# Patient Record
Sex: Female | Born: 1950
Health system: Southern US, Community
[De-identification: ages and names within clinical notes are randomized; demographics above are authoritative.]

## PROBLEM LIST (undated history)

## (undated) DIAGNOSIS — Z808 Family history of malignant neoplasm of other organs or systems: Secondary | ICD-10-CM

## (undated) DIAGNOSIS — Z9889 Other specified postprocedural states: Secondary | ICD-10-CM

## (undated) DIAGNOSIS — E039 Hypothyroidism, unspecified: Secondary | ICD-10-CM

## (undated) DIAGNOSIS — Z800 Family history of malignant neoplasm of digestive organs: Secondary | ICD-10-CM

## (undated) DIAGNOSIS — K219 Gastro-esophageal reflux disease without esophagitis: Secondary | ICD-10-CM

## (undated) DIAGNOSIS — F329 Major depressive disorder, single episode, unspecified: Secondary | ICD-10-CM

## (undated) DIAGNOSIS — I10 Essential (primary) hypertension: Secondary | ICD-10-CM

## (undated) DIAGNOSIS — Z8042 Family history of malignant neoplasm of prostate: Secondary | ICD-10-CM

## (undated) DIAGNOSIS — C50919 Malignant neoplasm of unspecified site of unspecified female breast: Secondary | ICD-10-CM

## (undated) DIAGNOSIS — E669 Obesity, unspecified: Secondary | ICD-10-CM

## (undated) DIAGNOSIS — M1990 Unspecified osteoarthritis, unspecified site: Secondary | ICD-10-CM

## (undated) DIAGNOSIS — F32A Depression, unspecified: Secondary | ICD-10-CM

## (undated) DIAGNOSIS — R112 Nausea with vomiting, unspecified: Secondary | ICD-10-CM

## (undated) DIAGNOSIS — F419 Anxiety disorder, unspecified: Secondary | ICD-10-CM

## (undated) DIAGNOSIS — Z803 Family history of malignant neoplasm of breast: Secondary | ICD-10-CM

## (undated) HISTORY — PX: OTHER SURGICAL HISTORY: SHX169

## (undated) HISTORY — PX: THYROID SURGERY: SHX805

## (undated) HISTORY — DX: Family history of malignant neoplasm of digestive organs: Z80.0

## (undated) HISTORY — DX: Obesity, unspecified: E66.9

## (undated) HISTORY — PX: CARPAL TUNNEL RELEASE: SHX101

## (undated) HISTORY — PX: JOINT REPLACEMENT: SHX530

## (undated) HISTORY — PX: KNEE ARTHROSCOPY: SUR90

## (undated) HISTORY — DX: Essential (primary) hypertension: I10

## (undated) HISTORY — DX: Family history of malignant neoplasm of other organs or systems: Z80.8

## (undated) HISTORY — DX: Depression, unspecified: F32.A

## (undated) HISTORY — DX: Gastro-esophageal reflux disease without esophagitis: K21.9

## (undated) HISTORY — DX: Family history of malignant neoplasm of prostate: Z80.42

## (undated) HISTORY — PX: BREAST SURGERY: SHX581

## (undated) HISTORY — DX: Family history of malignant neoplasm of breast: Z80.3

## (undated) HISTORY — DX: Major depressive disorder, single episode, unspecified: F32.9

## (undated) HISTORY — PX: ABDOMINAL HYSTERECTOMY: SHX81

## (undated) HISTORY — DX: Unspecified osteoarthritis, unspecified site: M19.90

## (undated) HISTORY — DX: Hypothyroidism, unspecified: E03.9

## (undated) HISTORY — DX: Malignant neoplasm of unspecified site of unspecified female breast: C50.919

## (undated) HISTORY — DX: Anxiety disorder, unspecified: F41.9

---

## 2010-10-20 ENCOUNTER — Ambulatory Visit (HOSPITAL_COMMUNITY)
Admission: RE | Admit: 2010-10-20 | Discharge: 2010-10-20 | Disposition: A | Discharge status: Home / Self Care | Payer: 59 | Source: Ambulatory Visit | Attending: Family Medicine | Admitting: Family Medicine

## 2010-10-20 ENCOUNTER — Other Ambulatory Visit (HOSPITAL_COMMUNITY): Payer: Self-pay | Admitting: Family Medicine

## 2010-10-20 DIAGNOSIS — M25569 Pain in unspecified knee: Secondary | ICD-10-CM | POA: Insufficient documentation

## 2010-10-20 DIAGNOSIS — M25561 Pain in right knee: Secondary | ICD-10-CM

## 2010-11-01 ENCOUNTER — Encounter (HOSPITAL_BASED_OUTPATIENT_CLINIC_OR_DEPARTMENT_OTHER)
Admission: RE | Admit: 2010-11-01 | Discharge: 2010-11-01 | Disposition: A | Discharge status: Home / Self Care | Payer: 59 | Source: Ambulatory Visit | Attending: Orthopedic Surgery | Admitting: Orthopedic Surgery

## 2010-11-01 DIAGNOSIS — Z01810 Encounter for preprocedural cardiovascular examination: Secondary | ICD-10-CM | POA: Insufficient documentation

## 2010-11-01 DIAGNOSIS — R9431 Abnormal electrocardiogram [ECG] [EKG]: Secondary | ICD-10-CM | POA: Insufficient documentation

## 2010-11-01 DIAGNOSIS — Z01812 Encounter for preprocedural laboratory examination: Secondary | ICD-10-CM | POA: Insufficient documentation

## 2010-11-01 LAB — BASIC METABOLIC PANEL
BUN: 9 mg/dL (ref 6–23)
CO2: 27 mEq/L (ref 19–32)
Calcium: 9 mg/dL (ref 8.4–10.5)
Chloride: 104 mEq/L (ref 96–112)
Creatinine, Ser: 0.71 mg/dL (ref 0.4–1.2)
GFR calc Af Amer: >60 mL/min (ref >60)
GFR calc non Af Amer: >60 mL/min (ref >60)
Glucose, Bld: 158 mg/dL — ABNORMAL HIGH (ref 70–99)
Potassium: 3.8 mEq/L (ref 3.5–5.1)
Sodium: 138 mEq/L (ref 135–145)

## 2010-11-02 ENCOUNTER — Ambulatory Visit (HOSPITAL_BASED_OUTPATIENT_CLINIC_OR_DEPARTMENT_OTHER)
Admission: RE | Admit: 2010-11-02 | Discharge: 2010-11-02 | Disposition: A | Discharge status: Home / Self Care | Payer: 59 | Source: Ambulatory Visit | Attending: Orthopedic Surgery | Admitting: Orthopedic Surgery

## 2010-11-02 DIAGNOSIS — M19049 Primary osteoarthritis, unspecified hand: Secondary | ICD-10-CM | POA: Insufficient documentation

## 2010-11-02 DIAGNOSIS — G5600 Carpal tunnel syndrome, unspecified upper limb: Secondary | ICD-10-CM | POA: Insufficient documentation

## 2010-11-02 LAB — GLUCOSE, CAPILLARY
Glucose-Capillary: 139 mg/dL — ABNORMAL HIGH (ref 70–99)
Glucose-Capillary: 125 mg/dL — ABNORMAL HIGH (ref 70–99)

## 2010-11-02 LAB — POCT HEMOGLOBIN-HEMACUE: Hemoglobin: 13.8 g/dL (ref 12.0–15.0)

## 2010-11-23 NOTE — Op Note (Signed)
  NAMECONSETTA, Kimberly [name]             ACCOUNT NO.:  [id number]  MEDICAL RECORD NO.:  [id number]          PATIENT TYPE:  LOCATION:                                 FACILITY:  PHYSICIAN:  Artist Pais. Sylvester Minton, M.D.DATE OF BIRTH:  August 23, 1951  DATE OF PROCEDURE:  11/02/2010 DATE OF DISCHARGE:                              OPERATIVE REPORT   PREOPERATIVE DIAGNOSES: 1. Chronic right carpal tunnel syndrome. 2. Right thumb carpometacarpal arthritis.  POSTOPERATIVE DIAGNOSES: 1. Chronic right carpal tunnel syndrome. 2. Right thumb carpometacarpal arthritis.  PROCEDURES: 1. Right carpal tunnel release. 2. Right thumb carpometacarpal injection.  SURGEON:  Artist Pais. Mina Marble, MD  ASSISTANT:  None.  ANESTHESIA:  General.  TOURNIQUET TIME:  15 minutes.  COMPLICATIONS:  None.  DRAINS:  None.  The patient was taken to the operating suite.  After induction of adequate general anesthesia, right upper thigh was prepped and draped in usual sterile fashion.  An Esmarch was used to exsanguinate the limb. Tourniquet was then inflated to 250 mmHg.  At this point in time, a 2-cm incision was made in the palmar aspect of the right hand in line with long finger metacarpal starting at [facility] cardinal line.  Skin was incised.  Palmar fascia was identified and split.  Distal edge of transverse carpal ligament was identified, slit with 15 blade.  Median nerve was identified, and protected with a Therapist, nutritional and remaining aspects of the transverse carpal ligament were then divided under direct vision.  Using the curved blunt scissors, the canal was inspected. There were no osseous lesions or ganglions present.  It was irrigated and loosely closed with a 3-0 Prolene subcuticular stitch.  Steri-Strips were applied.  Under fluoroscopic guidance, CMC joint of right thumb was injected with 1 mL Celestone.  The patient was then placed in a sterile dressing of 4x4s fluffs and a volar splint.  The  patient tolerated the procedures well and went to recovery in stable fashion.     Artist Pais Mina Marble, M.D.     MAW/MEDQ  D:  11/02/2010  T:  11/02/2010  Job:  [id number]  Electronically Signed by Dairl Ponder M.D. on 11/23/2010 11:28:15 AM

## 2011-01-05 ENCOUNTER — Other Ambulatory Visit: Payer: Self-pay | Admitting: Family Medicine

## 2011-01-05 DIAGNOSIS — M25561 Pain in right knee: Secondary | ICD-10-CM

## 2011-01-11 ENCOUNTER — Ambulatory Visit (HOSPITAL_COMMUNITY)
Admission: RE | Admit: 2011-01-11 | Discharge: 2011-01-11 | Disposition: A | Discharge status: Home / Self Care | Payer: 59 | Source: Ambulatory Visit | Attending: Family Medicine | Admitting: Family Medicine

## 2011-01-11 DIAGNOSIS — M25561 Pain in right knee: Secondary | ICD-10-CM

## 2011-01-11 DIAGNOSIS — M23329 Other meniscus derangements, posterior horn of medial meniscus, unspecified knee: Secondary | ICD-10-CM | POA: Insufficient documentation

## 2011-01-11 DIAGNOSIS — M7120 Synovial cyst of popliteal space [Baker], unspecified knee: Secondary | ICD-10-CM | POA: Insufficient documentation

## 2011-01-11 DIAGNOSIS — M25569 Pain in unspecified knee: Secondary | ICD-10-CM | POA: Insufficient documentation

## 2011-01-19 ENCOUNTER — Encounter: Payer: Self-pay | Admitting: Orthopedic Surgery

## 2011-01-19 ENCOUNTER — Ambulatory Visit (INDEPENDENT_AMBULATORY_CARE_PROVIDER_SITE_OTHER): Payer: 59 | Admitting: Orthopedic Surgery

## 2011-01-19 VITALS — Ht 64.0 in | Wt 203.0 lb

## 2011-01-19 DIAGNOSIS — M23329 Other meniscus derangements, posterior horn of medial meniscus, unspecified knee: Secondary | ICD-10-CM | POA: Insufficient documentation

## 2011-01-19 DIAGNOSIS — IMO0002 Reserved for concepts with insufficient information to code with codable children: Secondary | ICD-10-CM

## 2011-01-19 DIAGNOSIS — M179 Osteoarthritis of knee, unspecified: Secondary | ICD-10-CM | POA: Insufficient documentation

## 2011-01-19 DIAGNOSIS — M1710 Unilateral primary osteoarthritis, unspecified knee: Secondary | ICD-10-CM

## 2011-01-19 DIAGNOSIS — G5790 Unspecified mononeuropathy of unspecified lower limb: Secondary | ICD-10-CM | POA: Insufficient documentation

## 2011-01-19 NOTE — Progress Notes (Signed)
RIGHT knee pain.  60-year-old female, who presents with sudden onset of stabbing, dull, and sharp RIGHT knee pain beginning in February of this year, partially relieved by 800 mg of ibuprofen. Other treatments include wearing a brace.  She is a CNA at the [employer]   She has 7/10. Intermittent pain associated with catching and swelling.  She has already had MRI and plain film, which shows torn medial meniscus, and 3, compartment arthritis.  She also complains of pain in the back of the knee, lateral portion of the leg across the knee joint and into the lower leg, which may be associated with a neuritis.  All systems were reviewed Velna Hatchet reports history of some depression.  Family History  Problem Relation Age of Onset  . Cancer    . Diabetes     Past Medical History  Diagnosis Date  . Diabetes mellitus   . High blood pressure    Past Surgical History  Procedure Date  . Carpal tunnel release   . Thyroid surgery   . Abdominal hysterectomy   . Bunions    General: The patient is normally developed, with normal grooming and hygiene. There are no gross deformities. The body habitus is normal  Vital signs weight 203 pounds   , height 5 feet 4    , pulse 78   , respiratory rate 18  CDV: The pulse and perfusion of the extremities are normal   LYMPH: There is no gross lymphadenopathy in the extremities   Skin: There are no rashes, ulcers or cafe-au-lait spot   Psyche: The patient is alert, awake and oriented.  Mood is normal   Neuro:  The coordination and balance are normal.  Sensation is normal. Reflexes are 2+ and equal   Musculoskeletal   Upper extremity exam  Inspection and palpation revealed no abnormalities in the upper extremities.  Range of motion is full without contracture.  Motor exam is normal with grade 5 strength.  The joints are fully reduced without subluxation.  There is no atrophy or tremor and muscle tone is normal.  All joints are stable.  LEFT  knee range of motion, strength, stability and alignment were normal.  RIGHT knee. Range of motion was 125, and the knee did come to full extension, tenderness over the medial joint line. Lateral joint line. Inferior patella associated with some patellofemoral crepitance. The knee was stable in the coronal and sagittal plane. Strength and muscle tone were normal. I did detect a small joint effusion as well.  She also has some tenderness on the lateral compartment of the leg and anterior compartment and also on the lateral posterior portion of the knee and thigh, but the straight leg raise was negative. The gluteal region was nontender and the back was only mildly tender.  Impression torn medial meniscus, osteoarthritis, mononeuritis may be related to nerve impingement.  Plan is for arthroscopy, RIGHT knee, partial medial meniscectomy.  We reviewed his benefit ratio, the surgery, versus nonoperative treatment, as well as the goals of the surgery is to relieve the medial knee pain and decrease the arthritic symptoms.  We will further reevaluate the lateral leg symptoms of mononeuritis if persistent

## 2011-01-19 NOTE — Patient Instructions (Signed)
Stop Taking ibuprofen, and aspirin one week before surgery.

## 2011-01-26 ENCOUNTER — Other Ambulatory Visit: Payer: Self-pay | Admitting: Orthopedic Surgery

## 2011-01-26 ENCOUNTER — Encounter (HOSPITAL_COMMUNITY): Payer: 59

## 2011-01-26 ENCOUNTER — Other Ambulatory Visit: Payer: Self-pay | Admitting: Infectious Diseases

## 2011-01-26 LAB — BASIC METABOLIC PANEL
BUN: 13 mg/dL (ref 6–23)
Creatinine, Ser: 0.73 mg/dL (ref 0.4–1.2)
GFR calc non Af Amer: >60 mL/min (ref >60)
Glucose, Bld: 108 mg/dL — ABNORMAL HIGH (ref 70–99)
Potassium: 4.2 mEq/L (ref 3.5–5.1)

## 2011-01-26 LAB — HEMOGLOBIN AND HEMATOCRIT, BLOOD
HCT: 36.5 % (ref 36.0–46.0)
Hemoglobin: 12.3 g/dL (ref 12.0–15.0)

## 2011-01-26 LAB — SURGICAL PCR SCREEN
MRSA, PCR: NEGATIVE
Staphylococcus aureus: NEGATIVE

## 2011-01-30 ENCOUNTER — Telehealth: Payer: Self-pay | Admitting: Orthopedic Surgery

## 2011-01-30 NOTE — Telephone Encounter (Signed)
Per call to [facility], ph [phone], per Aundra Millet, no pre-authorization is required for out-patient surgery, scheduled 02/02/11 at [facility], CPT [phone], [phone].

## 2011-02-02 ENCOUNTER — Ambulatory Visit (HOSPITAL_COMMUNITY)
Admission: RE | Admit: 2011-02-02 | Discharge: 2011-02-02 | Disposition: A | Discharge status: Home / Self Care | Payer: 59 | Source: Ambulatory Visit | Attending: Orthopedic Surgery | Admitting: Orthopedic Surgery

## 2011-02-02 ENCOUNTER — Other Ambulatory Visit: Payer: Self-pay | Admitting: Orthopedic Surgery

## 2011-02-02 DIAGNOSIS — Z7982 Long term (current) use of aspirin: Secondary | ICD-10-CM | POA: Insufficient documentation

## 2011-02-02 DIAGNOSIS — M1710 Unilateral primary osteoarthritis, unspecified knee: Secondary | ICD-10-CM | POA: Insufficient documentation

## 2011-02-02 DIAGNOSIS — E119 Type 2 diabetes mellitus without complications: Secondary | ICD-10-CM | POA: Insufficient documentation

## 2011-02-02 DIAGNOSIS — I10 Essential (primary) hypertension: Secondary | ICD-10-CM | POA: Insufficient documentation

## 2011-02-02 DIAGNOSIS — M23329 Other meniscus derangements, posterior horn of medial meniscus, unspecified knee: Secondary | ICD-10-CM | POA: Insufficient documentation

## 2011-02-02 DIAGNOSIS — IMO0002 Reserved for concepts with insufficient information to code with codable children: Secondary | ICD-10-CM | POA: Insufficient documentation

## 2011-02-02 DIAGNOSIS — Z79899 Other long term (current) drug therapy: Secondary | ICD-10-CM | POA: Insufficient documentation

## 2011-02-02 LAB — GLUCOSE, CAPILLARY: Glucose-Capillary: 150 mg/dL — ABNORMAL HIGH (ref 70–99)

## 2011-02-06 ENCOUNTER — Ambulatory Visit (INDEPENDENT_AMBULATORY_CARE_PROVIDER_SITE_OTHER): Payer: 59 | Admitting: Orthopedic Surgery

## 2011-02-06 DIAGNOSIS — Z9889 Other specified postprocedural states: Secondary | ICD-10-CM

## 2011-02-06 NOTE — Progress Notes (Signed)
POST OP VIST 1 , DAY 4  SURGERY 02-02-2011 SARK P MED MEN   MILD SWELLING  PORTALS ARE CLEAN  NEUROVASCULAR EXAM IS NORMAL   PT   RETURN FOR POST OP VISIT 2

## 2011-02-06 NOTE — Op Note (Signed)
NAMEMILIANI, DEIKE NO.:  [id number]  MEDICAL RECORD NO.:  [id number]  LOCATION:  [facility]                          FACILITY:  [facility]  PHYSICIAN:  Vickki Hearing, M.D.DATE OF BIRTH:  10/27/1950  DATE OF PROCEDURE:  02/02/2011 DATE OF DISCHARGE:                              OPERATIVE REPORT   This is a 60 year old female presented with x-rays, MRI symptoms and exam findings consistent with torn medial meniscus and osteoarthritis of the right knee.  After discussion of the risks and benefits of nonoperative and surgical treatment, she opted for surgical treatment.  Informed consent was done in the office.  PREOPERATIVE DIAGNOSIS:  Torn medial meniscus and osteoarthritis, right knee.  POSTOPERATIVE DIAGNOSIS:  Torn medial meniscus and osteoarthritis, right knee.  PROCEDURE:  Arthroscopy right knee, partial medial meniscectomy, limited debridement of a synovial plica medially.  SURGEON:  Vickki Hearing, MD.  ASSISTANTS:  None.  ANESTHESIA:  General by LMA.  OPERATIVE FINDINGS: 1. Torn medial meniscus/posterior horn. 2. Osteoarthritis of the medial compartment grade 2 and a thickened     medial synovial plica with abrasion of the medial femoral condyle     from the plica.  There were no specimens.  The blood loss was minimal.  The patient went to, PACU in good condition.  Complications were none.  DETAILS OF THE PROCEDURE:  Are as follows.  The patient was identified in the preop area and her leg was evaluated and there was no change in the condition of the limb.  The site was marked by the patient and the surgeon and then the chart was updated.  The patient was taken to the operating room and given general anesthetic by LMA with no complications.  Ancef was started 1 gram.  The right leg was placed in an arthroscopic leg holder and prepped sterilely and then draped sterilely.  Time-out procedure was then completed.  A lateral portal was  established with an 11-blade after injecting the joint with 5 mL of 0.5% Marcaine with 1:200,000 epinephrine solution. Medial portal site was injected as well.  Scope was introduced through the lateral compartment into the medial compartment and a diagnostic arthroscopy was done taking a tour of the knee.  A spinal needle was used to make a medial portal and then a probe was placed and the meniscal tear was palpated and defined.  The meniscal tear was resected with a duckbill forceps and the meniscus fragments were removed with a shaver.  The meniscus was balanced with a combination of an arthroscopic shaver and a 50-degree ArthroCare wand.  A stable rim was created and this was confirmed by palpation with a probe.  Medial femoral condyle had grade 2 changes throughout its surface.  There was a large medial synovial plica which was resected and coagulation was done with the cautery from the ArthroCare wand.  There was significant synovitis in the knee as well.  The lateral compartment was normal including the meniscus.  The ACL and PCL were intact and normal.  The knee was injected through the scope with 0.5% Marcaine epinephrine solution total of 45 mL.  3-0 nylon sutures were used to close the portals.  Ace  wrap was applied over the dressing followed by cryo cuff which was activated.  The patient is allowed full weightbearing.  She was discharged on Phenergan and Norco 5 mg.  Follow up is scheduled for June 11.  PT is scheduled for June 12.     Vickki Hearing, M.D.     SEH/MEDQ  D:  02/02/2011  T:  02/03/2011  Job:  [id number]  Electronically Signed by Fuller Canada M.D. on 02/06/2011 12:52:06 PM

## 2011-02-06 NOTE — Patient Instructions (Signed)
Come back in 3 weeks  Start PT Wednesday  Start driving Thursday  Use ice 3 x day for 20 minutes at a time at least

## 2011-02-08 ENCOUNTER — Ambulatory Visit (HOSPITAL_COMMUNITY)
Admit: 2011-02-08 | Discharge: 2011-02-08 | Disposition: A | Discharge status: Home / Self Care | Payer: 59 | Source: Ambulatory Visit | Attending: Orthopedic Surgery | Admitting: Orthopedic Surgery

## 2011-02-08 DIAGNOSIS — R262 Difficulty in walking, not elsewhere classified: Secondary | ICD-10-CM | POA: Insufficient documentation

## 2011-02-08 DIAGNOSIS — M6281 Muscle weakness (generalized): Secondary | ICD-10-CM | POA: Insufficient documentation

## 2011-02-08 DIAGNOSIS — E119 Type 2 diabetes mellitus without complications: Secondary | ICD-10-CM | POA: Insufficient documentation

## 2011-02-08 DIAGNOSIS — M25569 Pain in unspecified knee: Secondary | ICD-10-CM | POA: Insufficient documentation

## 2011-02-08 DIAGNOSIS — M25669 Stiffness of unspecified knee, not elsewhere classified: Secondary | ICD-10-CM | POA: Insufficient documentation

## 2011-02-08 DIAGNOSIS — I10 Essential (primary) hypertension: Secondary | ICD-10-CM | POA: Insufficient documentation

## 2011-02-08 DIAGNOSIS — IMO0001 Reserved for inherently not codable concepts without codable children: Secondary | ICD-10-CM | POA: Insufficient documentation

## 2011-02-14 ENCOUNTER — Ambulatory Visit (HOSPITAL_COMMUNITY)
Admission: RE | Admit: 2011-02-14 | Discharge: 2011-02-14 | Disposition: A | Discharge status: Home / Self Care | Payer: 59 | Source: Ambulatory Visit | Attending: Family Medicine | Admitting: Family Medicine

## 2011-02-16 ENCOUNTER — Ambulatory Visit (HOSPITAL_COMMUNITY)
Admission: RE | Admit: 2011-02-16 | Discharge: 2011-02-16 | Disposition: A | Discharge status: Home / Self Care | Payer: 59 | Source: Ambulatory Visit | Attending: Family Medicine | Admitting: Family Medicine

## 2011-02-21 ENCOUNTER — Ambulatory Visit (HOSPITAL_COMMUNITY)
Admission: RE | Admit: 2011-02-21 | Discharge: 2011-02-21 | Disposition: A | Discharge status: Home / Self Care | Payer: 59 | Source: Ambulatory Visit | Attending: Family Medicine | Admitting: Family Medicine

## 2011-02-23 ENCOUNTER — Ambulatory Visit (HOSPITAL_COMMUNITY)
Admission: RE | Admit: 2011-02-23 | Discharge: 2011-02-23 | Disposition: A | Discharge status: Home / Self Care | Payer: 59 | Source: Ambulatory Visit | Attending: Family Medicine | Admitting: Family Medicine

## 2011-02-27 ENCOUNTER — Encounter: Payer: Self-pay | Admitting: Orthopedic Surgery

## 2011-02-27 ENCOUNTER — Ambulatory Visit (INDEPENDENT_AMBULATORY_CARE_PROVIDER_SITE_OTHER): Payer: 59 | Admitting: Orthopedic Surgery

## 2011-02-27 DIAGNOSIS — M23329 Other meniscus derangements, posterior horn of medial meniscus, unspecified knee: Secondary | ICD-10-CM

## 2011-02-27 DIAGNOSIS — Z9889 Other specified postprocedural states: Secondary | ICD-10-CM

## 2011-02-27 NOTE — Progress Notes (Signed)
POD # 25   S/P KNEE SCOPE DOING WELL; KNEE LOOKS GOOD   FLEXION ARC 120 DEGREES  WOULD LIKE TO GO BACK TO WORK ON July 22  PT NOTES REVIEWED INDICATE SHE IS DOING WELL   F/U 1 MONTH

## 2011-02-27 NOTE — Patient Instructions (Signed)
RTW July 22nd

## 2011-03-07 ENCOUNTER — Ambulatory Visit (HOSPITAL_COMMUNITY)
Admission: RE | Admit: 2011-03-07 | Discharge: 2011-03-07 | Disposition: A | Discharge status: Home / Self Care | Payer: 59 | Source: Ambulatory Visit | Attending: Orthopedic Surgery | Admitting: Orthopedic Surgery

## 2011-03-07 ENCOUNTER — Inpatient Hospital Stay (HOSPITAL_COMMUNITY): Admission: RE | Admit: 2011-03-07 | Discharge: 2011-03-07 | Payer: 59 | Source: Ambulatory Visit

## 2011-03-07 ENCOUNTER — Telehealth (HOSPITAL_COMMUNITY): Payer: Self-pay

## 2011-03-07 DIAGNOSIS — M25669 Stiffness of unspecified knee, not elsewhere classified: Secondary | ICD-10-CM | POA: Insufficient documentation

## 2011-03-07 DIAGNOSIS — IMO0001 Reserved for inherently not codable concepts without codable children: Secondary | ICD-10-CM | POA: Insufficient documentation

## 2011-03-07 DIAGNOSIS — R262 Difficulty in walking, not elsewhere classified: Secondary | ICD-10-CM | POA: Insufficient documentation

## 2011-03-07 DIAGNOSIS — M6281 Muscle weakness (generalized): Secondary | ICD-10-CM | POA: Insufficient documentation

## 2011-03-07 DIAGNOSIS — M25569 Pain in unspecified knee: Secondary | ICD-10-CM | POA: Insufficient documentation

## 2011-03-07 DIAGNOSIS — I10 Essential (primary) hypertension: Secondary | ICD-10-CM | POA: Insufficient documentation

## 2011-03-07 DIAGNOSIS — E119 Type 2 diabetes mellitus without complications: Secondary | ICD-10-CM | POA: Insufficient documentation

## 2011-03-07 NOTE — Progress Notes (Cosign Needed)
Physical Therapy Treatment Patient Name: Kimberly [name] [id number] Date: 03/07/2011  Time In: 11:04 Time Out: 11:38    Subjective: no pain.  Begin work 03/20/2011.  Swelling has been intermittent.    Objective:   Exercise/Treatments: Elliptical: 5' warm up.  4" step up x 20 reps, 4" lateral step up x 20 reps, step downs 4" x 20 reps.  SLS 3 reps with 30" max, mini wall squats at 45 degree holds 5"x 10 reps, Heel/toe walks 2 RT. Prone quad stretch 3x 30:, knee flexion: 3#x 20 reps, SLR 15 x; supine ex 15x, SAQ: 15reps x 5" holds with 3#, S/L hip abd 3#x 20 reps. @FLOW [phone]  Assessment: Pt. Tolerated treatment well, no reports of increase pain, did report slight discomfort with heel walks today.  Plan: continue with current POC, add 1# with SLR next session   Charges: 34 min therex  03/07/2011, 11:39 AM [name], PTA Rollene Rotunda Medendorp, PT, DPT

## 2011-03-09 ENCOUNTER — Ambulatory Visit (HOSPITAL_COMMUNITY)
Admission: RE | Admit: 2011-03-09 | Discharge: 2011-03-09 | Disposition: A | Discharge status: Home / Self Care | Payer: 59 | Source: Ambulatory Visit | Attending: Family Medicine | Admitting: Family Medicine

## 2011-03-09 NOTE — Progress Notes (Signed)
  Patient Name: Kimberly [name] MRN: [id number] Today's Date: 03/09/2011      Physical Therapy Treatment Note   Time In: 11:04 Time Out: 11:51   Subjective: pain free today, feeling tight lateral distal knee   Objective: pt. amb with proper gait mechanics   Exercises/Treatments: Elliptical: 5' L3 warm up. Standing: Heel raises 20 reps   Toe raises 20 reps   Heel/toe walks 2 RT   6" step up x 20 reps,    6" lateral step up x 10 reps   6" step downs x 10 reps.   Functional squates 20 reps    SLS 3 reps with 47" max,   BAPS L2 10 reps all directions for increase ankle strategy for balance     mini wall squats at 45 degree holds 5"x 10 reps,    Gastroc st 3x 30"   Soleus st 3x30" Supine  SLR 2# 10 reps   SAQ 3# 15 reps   H/S st 3x 30"   IT Band st 3x 30" S/L  Hip abduction 3# 20 reps  Prone  quad stretch 3x 30:,   knee flexion: 3# 20 reps,    SLR 2# 10 reps    Assessment: weak R ankle strategy presented with SLS today, added BAPS standing L2 to improve ankle strength for improved balance.  Added 2# SLR prone and supine for increase hip flexion/extension strength, reduced reps.  Pt completed without difficulty.   Plan: continue with current POC, progress strength.  Re-eval 03/25/2011   Charges: therex 42 min   Becky Sax, PTA    [name] 03/09/2011, 11:08 AM

## 2011-03-14 ENCOUNTER — Ambulatory Visit (HOSPITAL_COMMUNITY)
Admission: RE | Admit: 2011-03-14 | Discharge: 2011-03-14 | Disposition: A | Discharge status: Home / Self Care | Payer: 59 | Source: Ambulatory Visit | Attending: Family Medicine | Admitting: Family Medicine

## 2011-03-14 NOTE — Progress Notes (Signed)
  Patient Name: Kimberly [name] MRN: [id number] Today's Date: 03/14/2011      Physical Therapy Treatment Note  Time In: 11:04 Time Out: 11:55  Subjective: pain free today  Objective:  Exercises/Treatments: Elliptical 5' L3 Standing: Heel raises 20 reps  Toe raises 20 reps  Heel/toe walks 2 RT  6" step up x 20 reps,  6" lateral step up x 10 reps  6" step downs x 10 reps.  Rockerboard A/P and R/L Functional squats 20 reps  SLS 3 reps with 57" max,  BAPS L2 10 reps all directions for increase R ankle strategy for balance  mini wall squats at 45 degree holds 5"x 15 reps,  Gastroc st 3x 30"  Soleus st 3x30"   Supine: SLR 2# 12 reps  SAQ 3# 15 reps  H/S st 3x 30"  IT Band st 3x 30"   S/L Hip abduction 3# 20 reps   Prone: quad stretch 3x 30:,  knee flexion: 3# 20 reps,  SLR 2# 15 reps   Assessment: Added rockerboard R/L and A/P for increase weight distrubution/ improve R ankle stragedy, pt completed without diff.  Miinor vc with BAPS board for ankle not hip movements to improve ankle stragedy for improve balance.  Plan: Progress strength, begin proper lifting next session. Re-eval due 03/25/2011 one week after return to work, 1 week before return to MD.  Charges: Therex: 45 min   Becky Sax, PTA  [name] 03/14/2011, 11:06 AM

## 2011-03-16 ENCOUNTER — Ambulatory Visit (HOSPITAL_COMMUNITY)
Admission: RE | Admit: 2011-03-16 | Discharge: 2011-03-16 | Disposition: A | Discharge status: Home / Self Care | Payer: 59 | Source: Ambulatory Visit | Attending: Family Medicine | Admitting: Family Medicine

## 2011-03-16 DIAGNOSIS — M25569 Pain in unspecified knee: Secondary | ICD-10-CM

## 2011-03-16 DIAGNOSIS — M25669 Stiffness of unspecified knee, not elsewhere classified: Secondary | ICD-10-CM

## 2011-03-16 NOTE — Progress Notes (Addendum)
Physical Therapy Treatment Patient Name: Kimberly [name] [id number] Date: 03/16/2011 Initial Eval: 02/08/11  Re-eval: 02/23/11, next due 03/25/11 Next MD visit: 03/30/11 Visit #: 8 of 9 Time: [id number] Charges: 53 min TE HPI: Symptoms/Limitations Symptoms: "I'm feeling pretty good today.  I did a lot of shopping yesterday and had some pain, but I took a few asprin and felt better. I can even cross my legs to put on my socks. Pain range 0-4/10.  PM pain after she lays down." How long can you stand comfortably?: Hours without pain.   How long can you walk comfortably?: 3 hours to go shopping   Pain Assessment Currently in Pain?: No/denies   Mobility (including Balance) Ambulation/Gait Ambulation/Gait: Yes Ambulation/Gait Assistance: 7: Independent Gait Pattern: Within Functional Limits Stairs: Yes Stairs Assistance: 7: Independent Stair Management Technique: One rail Right;Alternating pattern Number of Stairs: 10  Height of Stairs: 6      Exercise/Treatments Elliptical 5' L3  STANDING: Heel raises w/eccentric lowering 2x10 reps  Toe raises 2x10 reps  Heel/toe walks 2 RT  6" step up x 20 reps,  6" lateral step up x 10 reps  6" step downs x 10 reps.  Rockerboard A/P and R/L 15x each Functional squats on BOSU 2x20 reps  SLS on foam  3x30 sec. w/intermittent UE support Gastroc st 3x 30"  Soleus st 3x30"  Supine:  SLR 2# 12 reps  SAQ 3# 15 reps  H/S st 3x 30"  IT Band st 3x 30"  S/L Hip abduction 3# 20 reps  Prone:  quad stretch 3x 30:,  knee flexion: 3# 20 reps,  SLR 2# 15 reps Goals PT Short Term Goals Short Term Goal 1 Progress: Met Short Term Goal 2 Progress: Met Short Term Goal 3 Progress: Progressing toward goal Short Term Goal 4 Progress: Met PT Long Term Goals Long Term Goal 1 Progress: Not met Long Term Goal 2 Progress: Met Long Term Goal 3 Progress: Met Long Term Goal 4 Progress: Progressing toward goal End of Session Patient Active Problem List  Diagnoses    . Medial meniscus, posterior horn derangement  . OA (osteoarthritis) of knee  . Mononeuritis leg  . Pain in joint, lower leg  . Stiffness of joint, not elsewhere classified, lower leg   PT - End of Session Activity Tolerance: Patient tolerated treatment well General Behavior During Session: [facility] for tasks performed Cognition: [facility] for tasks performed PT Assessment and Plan Clinical Impression Statement: Pt continues to have some increase in medial knee pain with full functional squats.  She has improved overall static and dynamic balance and has improved power with stair mobility. PT Plan: Continue to progress strength.  Add Squat with weight and transfer training techniques for proper LE health.   Humbert Morozov 03/16/2011, 12:06 PM

## 2011-03-21 ENCOUNTER — Ambulatory Visit (HOSPITAL_COMMUNITY)
Admission: RE | Admit: 2011-03-21 | Discharge: 2011-03-21 | Disposition: A | Discharge status: Home / Self Care | Payer: 59 | Source: Ambulatory Visit | Attending: Family Medicine | Admitting: Family Medicine

## 2011-03-21 NOTE — Progress Notes (Signed)
Physical Therapy Treatment Patient Name: Kimberly [name] [id number] Date: 03/21/2011  Time In: 1:35 Time Out: 2:30 Visit #: 9/10 Next Re-eval: next session prior MD appt.  Charge: therex 40 min  Subjective: Symptoms/Limitations Symptoms: Yesterday return to work, was good first 5 hours but had increased pain the last 3 hours of work.  Brought me an ice pack for the ride home.  5/10 right now R knee..  When assessed about lifting pt stated more pushing/pulling than lifting. Pain Assessment Currently in Pain?: Yes Pain Score:   5 Pain Location: Knee Pain Orientation: Right Pain Onset: Other (comment) (Return to work, increase pain with standing for long periods) Pain Relieving Factors: ice and IBprofen, non-weight bearing  Objective:  Exercise/Treatments Elliptical 5' L3   STANDING:  Heel raises w/eccentric lowering 2x10 reps B raise up, R lower w/ eccentric lowering Toe raises 2x10 reps  Heel/toe walks 2 RT  6" step up x 2x 10 reps,  6" lateral step up 2 x 10 reps  6" step downs 2 x 10 reps.  Rockerboard A/P and R/L 15x each  Functional squats on with 8# box with 10# 10reps floor to waist, 5reps waist to table Push/pull sled with 40# 3reps  SLS on foam 40" max of 3 Vector stance 3x 5" each Gastroc st 3x 30"  Soleus st 3x30"   SUPINE SLR 3# 15 reps  SAQ 3# 15 reps   SIDELYING: Hip abduction 3# 20 reps   PRONE: knee flexion: 3# 20 reps,  SLR 3# 10 reps Ice x 10 min    Lumbar Stretches Active Hamstring Stretch:  (done at home earlier) [facility]:  (done at home before session) Stability Exercises Heel Raises: 20 reps;Other (comment) (B LE up, R LE eccentric control descending) Lumbar Machine Exercises Elliptical: 5' L3 Hip Stretches Active Hamstring Stretch:  (done at home earlier) [facility]:  (done at home before session) Hip Exercises Knee Flexion: 20 reps (3#) Straight Leg Raises: 15 reps (3#) Hamstring Curl: 20 reps (3#) Hip Extension: 10 reps  (3#) Hip ABduction/ADduction: 10 reps (3# adduction) Additional Hip Exercises SLS:  (Foam 40" max of 3) Lateral Step Up: Step Height: 6" (10x2) Forward Step Up: Step Height: 6" (10x 2) Rocker Board:  (15 reps A/P, R/L) Elliptical: 5' L3 Knee Stretches Active Hamstring Stretch:  (done at home earlier) [facility]:  (done at home before session) Knee Exercises Knee Flexion: 20 reps (3#) Straight Leg Raises: 15 reps (3#) Heel Raises: 20 reps;Other (comment) (B LE up, R LE eccentric control descending) Hip Extension: 10 reps (3#) Hamstring Curl: 20 reps (3#) Hip ABduction: 20 reps (3#) Hip ABduction/ADduction: 10 reps (3# adduction) Additional Knee Exercises Lateral Step Up: Step Height: 6" (10x2) Forward Step Up: Step Height: 6" (10x 2) Rocker Board:  (15 reps A/P, R/L) SLS:  (Foam 40" max of 3) Ankle Exercises Heel Raises: 20 reps;Other (comment) (B LE up, R LE eccentric control descending) Toe Raise: 20 reps Additional Ankle Exercises SLS:  (Foam 40" max of 3) Rocker Board:  (15 reps A/P, R/L) Heel Walk (Round Trip): 2 RT Toe Walk (Round Trip): 2RT Balance Exercises Elliptical: 5' L3 Heel Raises: 20 reps;Other (comment) (B LE up, R LE eccentric control descending) Toe Raise: 20 reps Modalities Modalities: Cryotherapy Cryotherapy Number Minutes Cryotherapy: 10 Minutes Cryotherapy Location: Knee Type of Cryotherapy: Ice pack  Goals   End of Session Patient Active Problem List  Diagnoses  . Medial meniscus, posterior horn derangement  . OA (osteoarthritis) of  knee  . Mononeuritis leg  . Pain in joint, lower leg  . Stiffness of joint, not elsewhere classified, lower leg   PT - End of Session Activity Tolerance: Patient tolerated treatment well General Behavior During Session: [facility] for tasks performed Cognition: [facility] for tasks performed PT Assessment and Plan Clinical Impression Statement: Pt educated on proper transfer technique with her pt care with work,  vc/demonstration for proper lifting.  Pt demonstrated appropriately.  Pt stated more push/pull than lift with work so included push/pull sled for proper tech/pt safety.  Pt tolerated well towards total treatment with pain on anterior/medial knee with full functional squat. PT Plan: Re-eval next session prior MD appt.  [name] 03/21/2011, 4:25 PM

## 2011-03-24 ENCOUNTER — Ambulatory Visit (HOSPITAL_COMMUNITY)
Admission: RE | Admit: 2011-03-24 | Discharge: 2011-03-24 | Disposition: A | Discharge status: Home / Self Care | Payer: 59 | Source: Ambulatory Visit | Attending: Orthopedic Surgery | Admitting: Orthopedic Surgery

## 2011-03-24 NOTE — Progress Notes (Addendum)
  Patient Name: Kimberly [name] MRN: [id number] Today's Date: 03/24/2011 Time: 130-147 Charges: 1 ROM, 1 MMT, 10 min self care TODAY: Elliptical x 5 min w/discussion of goals and d/c.  Reviewed HEP  Diagnosis: R SARK ICD-9 Code: 719.46, 719.56 Referring practitioner: Harrsion Date of next MD visit: 03/30/11 Date of initial PT Visit: 02/08/11 Patient seen for 9 sessions  Subjective:    Patient's response to therapy: "I think today is going to be my last day.  I do not have any pain and  I have returned to work."  She reports her main difficulty is squatting and reports it is getting easier.    Objective:   Current condition: No edema present, incisions are healing well, demonstrates independence w/HEP Working 8 hours a day.  Lower Extremity Functional Scale: 74/80  (36/80).  Pt has improved her dynamic balance and is able to complete outdoor activities without an increase in pain or difficulty.  She is able to perform SLS on foam x40 sec.   Test measurement:   MMT R side AROM  PROM  Hip flexion 4+/5 (3/5)    Gluteus Medius 5/5 (4/5)    Gluteus Maximus  4/5 (4-/5)    Adductors 5/5 (4/5)    Knee Extension 5/5 (4/5) 0 degrees (0) 0 degrees (0)  Knee Flexion  5/5 (3+/5 120 degrees (70) 120 degrees  (90)  (  )  = Initial Evaluation measurements      Assessment:   Summary/analysis of evaluation: Pt is a 60 y.o female who was referred s/p R SARK.  Pt has met 4/4 STG and 4/4 LTG.  Pt has improved her strength, ROM, dynamic balance, perceived functional ability and is independent with HEP.   Plan:   D/C w/HEP.  Follow Up w/MD on 03/30/11  Thank you for your referral!   Johnchristopher Sarvis 03/24/2011, 1:36 PM

## 2011-03-28 ENCOUNTER — Ambulatory Visit (HOSPITAL_COMMUNITY): Payer: 59 | Admitting: Physical Therapy

## 2011-03-30 ENCOUNTER — Ambulatory Visit (HOSPITAL_COMMUNITY): Payer: 59 | Admitting: Physical Therapy

## 2011-03-30 ENCOUNTER — Ambulatory Visit (INDEPENDENT_AMBULATORY_CARE_PROVIDER_SITE_OTHER): Payer: 59 | Admitting: Orthopedic Surgery

## 2011-03-30 DIAGNOSIS — M1710 Unilateral primary osteoarthritis, unspecified knee: Secondary | ICD-10-CM

## 2011-03-30 DIAGNOSIS — M179 Osteoarthritis of knee, unspecified: Secondary | ICD-10-CM

## 2011-03-30 DIAGNOSIS — IMO0002 Reserved for concepts with insufficient information to code with codable children: Secondary | ICD-10-CM

## 2011-03-30 DIAGNOSIS — M23329 Other meniscus derangements, posterior horn of medial meniscus, unspecified knee: Secondary | ICD-10-CM

## 2011-03-30 NOTE — Progress Notes (Signed)
PREOPERATIVE DIAGNOSIS: Torn medial meniscus and osteoarthritis, right  knee.  POSTOPERATIVE DIAGNOSIS: Torn medial meniscus and osteoarthritis, right  knee.  PROCEDURE: Arthroscopy right knee, partial medial meniscectomy, limited  debridement of a synovial plica medially.   Patient is doing well pretty much asymptomatic. Finish therapy, doing very well. No problems at this time. Discharge to routine activities

## 2011-03-30 NOTE — Patient Instructions (Addendum)
Come back as needed

## 2013-02-10 ENCOUNTER — Encounter: Payer: Self-pay | Admitting: *Deleted

## 2013-02-10 ENCOUNTER — Other Ambulatory Visit: Payer: Self-pay | Admitting: Family Medicine

## 2013-06-09 ENCOUNTER — Other Ambulatory Visit: Payer: Self-pay | Admitting: Family Medicine

## 2013-06-23 ENCOUNTER — Other Ambulatory Visit: Payer: Self-pay | Admitting: *Deleted

## 2013-06-23 MED ORDER — LEVOTHYROXINE SODIUM 137 MCG PO TABS: 137.0000 ug | ORAL_TABLET | Freq: Every day | ORAL | Status: DC

## 2013-07-03 ENCOUNTER — Encounter: Payer: Self-pay | Admitting: Nurse Practitioner

## 2013-07-03 ENCOUNTER — Ambulatory Visit (INDEPENDENT_AMBULATORY_CARE_PROVIDER_SITE_OTHER): Payer: 59 | Admitting: Nurse Practitioner

## 2013-07-03 VITALS — BP 130/80 | Ht 62.0 in | Wt 205.0 lb

## 2013-07-03 DIAGNOSIS — E039 Hypothyroidism, unspecified: Secondary | ICD-10-CM

## 2013-07-03 DIAGNOSIS — E119 Type 2 diabetes mellitus without complications: Secondary | ICD-10-CM

## 2013-07-03 DIAGNOSIS — R5381 Other malaise: Secondary | ICD-10-CM

## 2013-07-03 DIAGNOSIS — R5383 Other fatigue: Secondary | ICD-10-CM

## 2013-07-03 DIAGNOSIS — Z0000 Encounter for general adult medical examination without abnormal findings: Secondary | ICD-10-CM

## 2013-07-03 DIAGNOSIS — Z01419 Encounter for gynecological examination (general) (routine) without abnormal findings: Secondary | ICD-10-CM

## 2013-07-03 DIAGNOSIS — I10 Essential (primary) hypertension: Secondary | ICD-10-CM

## 2013-07-03 MED ORDER — NAPROXEN 500 MG PO TABS: 500.0000 mg | ORAL_TABLET | Freq: Two times a day (BID) | ORAL | Status: DC

## 2013-07-06 ENCOUNTER — Encounter: Payer: Self-pay | Admitting: Nurse Practitioner

## 2013-07-06 DIAGNOSIS — E119 Type 2 diabetes mellitus without complications: Secondary | ICD-10-CM | POA: Insufficient documentation

## 2013-07-06 DIAGNOSIS — I10 Essential (primary) hypertension: Secondary | ICD-10-CM | POA: Insufficient documentation

## 2013-07-06 DIAGNOSIS — E039 Hypothyroidism, unspecified: Secondary | ICD-10-CM | POA: Insufficient documentation

## 2013-07-06 NOTE — Progress Notes (Signed)
  Subjective:    Patient ID: Kimberly [name], female    DOB: February 02, 1951, 62 y.o.   MRN: [id number]  HPI presents for her wellness checkup. Regular eye exams. Regular dental exams. Had her flu shot at work. Same sexual partner. No pelvic pain. No discharge. Taking medications as prescribed. Minimal activity due to chronic arthritis pain in both knees especially on the right. Irritation on her first toe of her left foot for a while. Wears good fitting shoes for work.    Review of Systems  Constitutional: Negative for activity change, appetite change and fatigue.  HENT: Negative for dental problem, ear pain, hearing loss and sore throat.   Eyes: Negative for visual disturbance.  Respiratory: Negative for cough, chest tightness, shortness of breath and wheezing.   Cardiovascular: Negative for chest pain and leg swelling.  Gastrointestinal: Negative for nausea, vomiting, abdominal pain, diarrhea, constipation and blood in stool.  Genitourinary: Negative for dysuria, urgency, frequency, vaginal discharge, enuresis, difficulty urinating and pelvic pain.  Musculoskeletal: Positive for arthralgias.  Psychiatric/Behavioral: Negative for sleep disturbance and dysphoric mood. The patient is not nervous/anxious.        Objective:   Physical Exam  Vitals reviewed. Constitutional: She is oriented to person, place, and time. She appears well-developed. No distress.  HENT:  Right Ear: External ear normal.  Left Ear: External ear normal.  Mouth/Throat: Oropharynx is clear and moist.  Neck: Normal range of motion. Neck supple. No tracheal deviation present. No thyromegaly present.  Cardiovascular: Normal rate, regular rhythm and normal heart sounds.  Exam reveals no gallop.   No murmur heard. Pulmonary/Chest: Effort normal and breath sounds normal.  Abdominal: Soft. She exhibits no distension. There is no tenderness.  Musculoskeletal: She exhibits no edema.  Lymphadenopathy:    She has no cervical  adenopathy.  Neurological: She is alert and oriented to person, place, and time.  Skin: Skin is warm and dry. No rash noted.  Psychiatric: She has a normal mood and affect. Her behavior is normal.   Breast exam: No masses noted, axilla no adenopathy. External GU normal. Bimanual exam: Ovaries nonpalpable exam limited due to abdominal girth. Significant central obesity. Rectal exam normal, no stool for Hemoccult.        Assessment & Plan:  Well woman exam  Type 2 diabetes mellitus - Plan: Basic metabolic panel, Hemoglobin A1c, Microalbumin, urine, Lipid panel  Essential hypertension, benign - Plan: Basic metabolic panel, Lipid panel  Hypothyroidism - Plan: TSH  Fatigue - Plan: Basic metabolic panel, CBC with Differential, Hepatic function panel, Vit D  25 hydroxy (rtn osteoporosis monitoring)  Routine general medical examination at a health care facility - Plan: MM Digital Screening, DG Bone Density  Mammogram scheduled, lab report. Schedule bone density study. Given information on colonoscopy so patient can schedule. Given prescription for Zostavax. Encourage regular activity and weight loss. Daily vitamin D and calcium supplementation. Followup in 3-4 months, call back sooner if needed. Next physical in one year. Meds ordered this encounter  Medications  . naproxen (NAPROSYN) 500 MG tablet    Sig: Take 1 tablet (500 mg total) by mouth 2 (two) times daily with a meal.    Dispense:  180 tablet    Refill:  1    Order Specific Question:  Supervising Provider    Answer:  Riccardo Dubin

## 2013-07-07 ENCOUNTER — Ambulatory Visit (HOSPITAL_COMMUNITY)
Admission: RE | Admit: 2013-07-07 | Discharge: 2013-07-07 | Disposition: A | Discharge status: Home / Self Care | Payer: 59 | Source: Ambulatory Visit | Attending: Nurse Practitioner | Admitting: Nurse Practitioner

## 2013-07-07 DIAGNOSIS — Z780 Asymptomatic menopausal state: Secondary | ICD-10-CM | POA: Insufficient documentation

## 2013-07-07 DIAGNOSIS — Z1231 Encounter for screening mammogram for malignant neoplasm of breast: Secondary | ICD-10-CM | POA: Insufficient documentation

## 2013-07-09 LAB — CBC WITH DIFFERENTIAL/PLATELET
Eosinophils Absolute: 0.3 10*3/uL (ref 0.0–0.7)
Eosinophils Relative: 4 % (ref 0–5)
HCT: 34.9 % — ABNORMAL LOW (ref 36.0–46.0)
Hemoglobin: 11.9 g/dL — ABNORMAL LOW (ref 12.0–15.0)
Lymphocytes Relative: 33 % (ref 12–46)
Lymphs Abs: 2.1 10*3/uL (ref 0.7–4.0)
MCH: 32.1 pg (ref 26.0–34.0)
MCV: 94.1 fL (ref 78.0–100.0)
Monocytes Absolute: 0.6 10*3/uL (ref 0.1–1.0)
Monocytes Relative: 9 % (ref 3–12)
Platelets: 295 10*3/uL (ref 150–400)
RBC: 3.71 MIL/uL — ABNORMAL LOW (ref 3.87–5.11)
WBC: 6.3 10*3/uL (ref 4.0–10.5)

## 2013-07-09 LAB — LIPID PANEL
Cholesterol: 160 mg/dL (ref 0–200)
Total CHOL/HDL Ratio: 3.9 Ratio
Triglycerides: 139 mg/dL (ref <150)
VLDL: 28 mg/dL (ref 0–40)

## 2013-07-09 LAB — BASIC METABOLIC PANEL
Chloride: 103 mEq/L (ref 96–112)
Potassium: 4.3 mEq/L (ref 3.5–5.3)
Sodium: 140 mEq/L (ref 135–145)

## 2013-07-09 LAB — HEMOGLOBIN A1C
Hgb A1c MFr Bld: 6.2 % — ABNORMAL HIGH (ref <5.7)
Mean Plasma Glucose: 131 mg/dL — ABNORMAL HIGH (ref <117)

## 2013-07-09 LAB — HEPATIC FUNCTION PANEL
AST: 22 U/L (ref 0–37)
Bilirubin, Direct: 0.1 mg/dL (ref 0.0–0.3)
Indirect Bilirubin: 0.2 mg/dL (ref 0.0–0.9)
Total Bilirubin: 0.3 mg/dL (ref 0.3–1.2)

## 2013-07-10 ENCOUNTER — Other Ambulatory Visit: Payer: Self-pay | Admitting: *Deleted

## 2013-07-10 MED ORDER — LOSARTAN POTASSIUM 100 MG PO TABS: 100.0000 mg | ORAL_TABLET | Freq: Every day | ORAL | Status: DC

## 2013-07-10 MED ORDER — METFORMIN HCL 500 MG PO TABS: 500.0000 mg | ORAL_TABLET | Freq: Two times a day (BID) | ORAL | Status: DC

## 2013-07-10 MED ORDER — VENLAFAXINE HCL ER 150 MG PO CP24: ORAL_CAPSULE | ORAL | Status: DC

## 2013-07-16 ENCOUNTER — Encounter: Payer: Self-pay | Admitting: Nurse Practitioner

## 2013-07-16 DIAGNOSIS — R809 Proteinuria, unspecified: Secondary | ICD-10-CM | POA: Insufficient documentation

## 2013-07-17 ENCOUNTER — Other Ambulatory Visit: Payer: Self-pay

## 2013-07-17 MED ORDER — LEVOTHYROXINE SODIUM 137 MCG PO TABS: 137.0000 ug | ORAL_TABLET | Freq: Every day | ORAL | Status: DC

## 2013-08-04 ENCOUNTER — Other Ambulatory Visit: Payer: Self-pay | Admitting: Family Medicine

## 2013-08-07 ENCOUNTER — Ambulatory Visit (INDEPENDENT_AMBULATORY_CARE_PROVIDER_SITE_OTHER): Payer: 59

## 2013-08-07 ENCOUNTER — Ambulatory Visit (INDEPENDENT_AMBULATORY_CARE_PROVIDER_SITE_OTHER): Payer: 59 | Admitting: Orthopedic Surgery

## 2013-08-07 ENCOUNTER — Other Ambulatory Visit: Payer: Self-pay | Admitting: Nurse Practitioner

## 2013-08-07 ENCOUNTER — Encounter: Payer: Self-pay | Admitting: Orthopedic Surgery

## 2013-08-07 ENCOUNTER — Telehealth: Payer: Self-pay | Admitting: Family Medicine

## 2013-08-07 VITALS — Ht 63.0 in | Wt 203.0 lb

## 2013-08-07 DIAGNOSIS — M1710 Unilateral primary osteoarthritis, unspecified knee: Secondary | ICD-10-CM

## 2013-08-07 DIAGNOSIS — M25569 Pain in unspecified knee: Secondary | ICD-10-CM

## 2013-08-07 DIAGNOSIS — IMO0002 Reserved for concepts with insufficient information to code with codable children: Secondary | ICD-10-CM

## 2013-08-07 DIAGNOSIS — M179 Osteoarthritis of knee, unspecified: Secondary | ICD-10-CM

## 2013-08-07 DIAGNOSIS — M25561 Pain in right knee: Secondary | ICD-10-CM

## 2013-08-07 NOTE — Progress Notes (Signed)
Patient ID: Kimberly [name], female   DOB: 1951-02-08, 62 y.o.   MRN: [id number]   Chief Complaint  Patient presents with  . Knee Pain    Bilateral knee pain    Ht 5\' 3"  (1.6 m)  Wt 203 lb (92.08 kg)  BMI 35.97 kg/m2  History: This is a 62 year old female who works at the [employer] and is on her feet all day presents with history of previous arthroscopy of the right knee 3 years ago she was found to have some arthritis in her knee at that time along with her torn cartilage. She now presents with bilateral knee pain with sharp dull throbbing burning pain which radiates into her tibia. Denies back pain numbness or tingling. She does have 5-10 out of 10 pain depending on if she's standing or walking. She has associated symptoms of locking catching swelling and bruising  She says her knees and her legs are her life in her life is being severely inhibited because it's difficult for her work. She can get up and down from a chair easily she can't kneel squat or bend her knees when she's working. She has a history of thyroid disease blood pressure hypertension diabetes. She's had a hysterectomy knee surgery. 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 Her review of systems she listed as depression only otherwise negative  Medications are aspirin hydrochlorothiazide levothyroxine losartan metformin venlafaxine  She's had 3 incidents including naproxen ibuprofen and diclofenac without relief of her pain  We discussed this at length and she has decided to proceed with surgical treatment versus injection and pain medication.  Her overall body habitus is medium to large. She is well-groomed. Hygiene is normal. She is oriented x3. Mood is pleasant. Her ambulation is without significant limp.  Upper extremity exam  The right and left upper extremity:   Inspection and palpation  revealed no abnormalities in the upper extremities.   Range of motion is full without contracture.  Motor exam is normal with grade 5 strength.  The joints are fully reduced without subluxation.  There is no atrophy or tremor and muscle tone is normal.  All joints are stable.   She has bilateral varus alignment to her knees with medial and lateral joint line tenderness her range of motion reveals flexion ARC of 115 the knees are stable quadriceps strength is normal skin is normal. Distal neurovascular function is intact she has no gross lymphadenopathy. No pathologic reflexes. Balance is normal.  Her x-rays show bilateral knee arthritis with medial compartment primary disease  Diagnosis Encounter Diagnoses  Name Primary?  . Bilateral knee pain Yes  . OA (osteoarthritis) of knee     Plan the patient will speak with her husband regarding right total knee arthroplasty for December 29

## 2013-08-07 NOTE — Telephone Encounter (Signed)
Patient says that she failed with the naproxen that she was changed to for pain. She switched back to diclofenac and is hoping to have a 3 month supply of this called in to [facility]

## 2013-08-07 NOTE — Patient Instructions (Signed)
You have been scheduled for KNEE REPLACEMENT surgery.  All surgeries carry some risk.  Remember you always have the option of continued nonsurgical treatment. However in this situation the risks vs. the benefits favor surgery as the best treatment option. The risks of the surgery includes the following but is not limited to bleeding, infection, pulmonary embolus, death from anesthesia, nerve injury vascular injury or need for further surgery, continued pain.  Specific to this procedure the following risks and complications are rare but possible Stiffness Pain  Infection which may require several subsequent surgeries including an amputation of the infection cannot be removed Instability     Total Knee Replacement Total knee replacement is a procedure to replace your knee joint with an artificial knee joint (prosthetic knee joint). The purpose of this surgery is to reduce pain and improve your knee function. LET YOUR CAREGIVER KNOW ABOUT:   Any allergies you have.  Any medicines you are taking, including vitamins, herbs, eyedrops, over-the-counter medicines, and creams.  Any problems you have had with the use of anesthetics.  Family history of problems with the use of anesthetics.  Any blood disorders you have, including bleeding problems or clotting problems.  Previous surgeries you have had. RISKS AND COMPLICATIONS  Generally, total knee replacement is a safe procedure. However, as with any surgical procedure, complications can occur. Possible complications associated with total knee replacement include:  Loss of range of motion of the knee or instability.  Loosening of the prosthesis.  Infection.  Persistent pain. BEFORE THE PROCEDURE   Your caregiver will instruct you when you need to stop eating and drinking.  Ask your caregiver if you need to change or stop any regular medicines. PROCEDURE  Just before the procedure you will receive medicine that will make you drowsy  (sedative). This will be given through a tube that is inserted into one of your veins (intravenous [IV] tube). Then you will either receive medicine to block pain from the waist down through your legs (spinal block) or medicine to also receive medicine to make you fall asleep (general anesthetic). You may also receive medicine to block feeling in your leg (nerve block) to help ease pain after surgery. An incision will be made in your knee. Your surgeon will take out any damaged cartilage and bone by sawing off the damaged surfaces. Then the surgeon will put a new metal liner over the sawed off portion of your thigh bone (femur) and a plastic liner over the sawed off portion of one of the bones of your lower leg (tibia). This is to restore alignment and function to your knee. A plastic piece is often used to restore the surface of your knee cap. AFTER THE PROCEDURE  You will be taken to the recovery area. You may have drainage tubes to drain excess fluid from your knee. These tubes attach to a device that removes these fluids. Once you are awake, stable, and taking fluids well, you will be taken to your hospital room. You will receive physical therapy as prescribed by your caregiver. The length of your stay in the hospital after a knee replacement is 2 4 days. Your surgeon may recommend that you spend time (usually an additional 10 14 days) in an extended-care facility to help you begin walking again and improve your range of motion before you go home. You may also be prescribed blood-thinning medicine to decrease your risk of developing blood clots in your leg. Document Released: 11/20/2000 Document Revised: 02/13/2012 Document Reviewed:  09/24/2011 [employer] Patient Information 2014 [place], [place].

## 2013-08-07 NOTE — Telephone Encounter (Signed)
Dr. Lorin Picket sent in 90 day RX on 12/8

## 2013-08-07 NOTE — Telephone Encounter (Signed)
Notified patient Dr. Lorin Picket sent in 90 day RX on 12/8. Patient verbalized understanding.

## 2013-08-07 NOTE — Telephone Encounter (Signed)
Will change back to Diclofenac and send in Rx

## 2013-08-08 ENCOUNTER — Telehealth: Payer: Self-pay | Admitting: Orthopedic Surgery

## 2013-08-08 ENCOUNTER — Other Ambulatory Visit: Payer: Self-pay | Admitting: *Deleted

## 2013-08-08 ENCOUNTER — Encounter (HOSPITAL_COMMUNITY): Payer: Self-pay | Admitting: Pharmacy Technician

## 2013-08-08 MED ORDER — BUPIVACAINE LIPOSOME 1.3 % IJ SUSP: 20.0000 mL | Freq: Once | INTRAMUSCULAR | Status: DC

## 2013-08-08 NOTE — Telephone Encounter (Signed)
Patient scheduled for surgery 08/25/13, and was made aware of dates and times.

## 2013-08-08 NOTE — Telephone Encounter (Signed)
I called patient as had not received message regarding status of decision for surgery - due to need for prior authorization and scheduling.  Patient confirmed that she elects to proceed with surgery as discussed per office visit 08/07/13, for 08/25/13.  Please call patient at home (said she is home today) ph#[phone]

## 2013-08-12 ENCOUNTER — Telehealth: Payer: Self-pay | Admitting: Orthopedic Surgery

## 2013-08-12 NOTE — Telephone Encounter (Signed)
Regarding surgery scheduled at [facility], in-patient/admit, 08/25/13, CPT [phone], ICD9 715.96 -- Contacted insurer, [employer], initially via phone (per Doneen Poisson) 08/08/13 - relayed pre-authorization may not be required -  Follow up contact, 08/12/13, via online request: [email or web address] - entered request online, received notification of submission - contact to follow within 1 business day, per printed notification.

## 2013-08-13 ENCOUNTER — Telehealth: Payer: Self-pay | Admitting: Orthopedic Surgery

## 2013-08-13 NOTE — Telephone Encounter (Signed)
As per separate entry phone note entered by B. Neale Burly, 08/13/13: Heather/[employer] called this morning with the approval for [facility] surgery. Kimberly [name] is approved for a 2 day stay at [facility]. Ref # [id number]  Any questions Herbert Seta can be reached at : [phone] ext [phone]

## 2013-08-13 NOTE — Telephone Encounter (Signed)
Heather/[employer] called this morning with the approval for  [facility] surgery. Kimberly [name] is approved for a 2 day stay at [facility]. Ref # [id number] Any questions Herbert Seta can be reached at : [phone] ext [phone]

## 2013-08-18 NOTE — Patient Instructions (Signed)
Kimberly [name]  08/18/2013   Your procedure is scheduled on:  08/25/2013  Report to [facility] at 615  AM.  Call this number if you have problems the morning of surgery: [phone]   Remember:   Do not eat food or drink liquids after midnight.   Take these medicines the morning of surgery with A SIP OF WATER: synthroid, cozaar, effexor   Do not wear jewelry, make-up or nail polish.  Do not wear lotions, powders, or perfumes.   Do not shave 48 hours prior to surgery. Men may shave face and neck.  Do not bring valuables to the hospital.  [facility] is not responsible for any belongings or valuables.               Contacts, dentures or bridgework may not be worn into surgery.  Leave suitcase in the car. After surgery it may be brought to your room.  For patients admitted to the hospital, discharge time is determined by your  treatment team.               Patients discharged the day of surgery will not be allowed to drive home.  Name and phone number of your driver: family  Special Instructions: Shower using CHG 2 nights before surgery and the night before surgery.  If you shower the day of surgery use CHG.  Use special wash - you have one bottle of CHG for all showers.  You should use approximately 1/3 of the bottle for each shower.   Please read over the following fact sheets that you were given: Pain Booklet, Coughing and Deep Breathing, Blood Transfusion Information, Total Joint Packet, MRSA Information, Surgical Site Infection Prevention, Anesthesia Post-op Instructions and Care and Recovery After Surgery Total Knee Replacement Total knee replacement is a procedure to replace your knee joint with an artificial knee joint (prosthetic knee joint). The purpose of this surgery is to reduce pain and improve your knee function. LET YOUR CAREGIVER KNOW ABOUT:   Any allergies you have.  Any medicines you are taking, including vitamins, herbs, eyedrops, over-the-counter medicines,  and creams.  Any problems you have had with the use of anesthetics.  Family history of problems with the use of anesthetics.  Any blood disorders you have, including bleeding problems or clotting problems.  Previous surgeries you have had. RISKS AND COMPLICATIONS  Generally, total knee replacement is a safe procedure. However, as with any surgical procedure, complications can occur. Possible complications associated with total knee replacement include:  Loss of range of motion of the knee or instability.  Loosening of the prosthesis.  Infection.  Persistent pain. BEFORE THE PROCEDURE   Your caregiver will instruct you when you need to stop eating and drinking.  Ask your caregiver if you need to change or stop any regular medicines. PROCEDURE  Just before the procedure you will receive medicine that will make you drowsy (sedative). This will be given through a tube that is inserted into one of your veins (intravenous [IV] tube). Then you will either receive medicine to block pain from the waist down through your legs (spinal block) or medicine to also receive medicine to make you fall asleep (general anesthetic). You may also receive medicine to block feeling in your leg (nerve block) to help ease pain after surgery. An incision will be made in your knee. Your surgeon will take out any damaged cartilage and bone by sawing off the damaged surfaces. Then the surgeon will  put a new metal liner over the sawed off portion of your thigh bone (femur) and a plastic liner over the sawed off portion of one of the bones of your lower leg (tibia). This is to restore alignment and function to your knee. A plastic piece is often used to restore the surface of your knee cap. AFTER THE PROCEDURE  You will be taken to the recovery area. You may have drainage tubes to drain excess fluid from your knee. These tubes attach to a device that removes these fluids. Once you are awake, stable, and taking fluids  well, you will be taken to your hospital room. You will receive physical therapy as prescribed by your caregiver. The length of your stay in the hospital after a knee replacement is 2 4 days. Your surgeon may recommend that you spend time (usually an additional 10 14 days) in an extended-care facility to help you begin walking again and improve your range of motion before you go home. You may also be prescribed blood-thinning medicine to decrease your risk of developing blood clots in your leg. Document Released: 11/20/2000 Document Revised: 02/13/2012 Document Reviewed: 09/24/2011 [facility] Patient Information 2014 [place], [place]. PATIENT INSTRUCTIONS POST-ANESTHESIA  IMMEDIATELY FOLLOWING SURGERY:  Do not drive or operate machinery for the first twenty four hours after surgery.  Do not make any important decisions for twenty four hours after surgery or while taking narcotic pain medications or sedatives.  If you develop intractable nausea and vomiting or a severe headache please notify your doctor immediately.  FOLLOW-UP:  Please make an appointment with your surgeon as instructed. You do not need to follow up with anesthesia unless specifically instructed to do so.  WOUND CARE INSTRUCTIONS (if applicable):  Keep a dry clean dressing on the anesthesia/puncture wound site if there is drainage.  Once the wound has quit draining you may leave it open to air.  Generally you should leave the bandage intact for twenty four hours unless there is drainage.  If the epidural site drains for more than 36-48 hours please call the anesthesia department.  QUESTIONS?:  Please feel free to call your physician or the hospital operator if you have any questions, and they will be happy to assist you.

## 2013-08-19 ENCOUNTER — Encounter (HOSPITAL_COMMUNITY): Payer: Self-pay

## 2013-08-19 ENCOUNTER — Encounter (HOSPITAL_COMMUNITY)
Admission: RE | Admit: 2013-08-19 | Discharge: 2013-08-19 | Disposition: A | Discharge status: Home / Self Care | Payer: 59 | Source: Ambulatory Visit | Attending: Orthopedic Surgery | Admitting: Orthopedic Surgery

## 2013-08-19 DIAGNOSIS — Z01810 Encounter for preprocedural cardiovascular examination: Secondary | ICD-10-CM | POA: Insufficient documentation

## 2013-08-19 DIAGNOSIS — Z01812 Encounter for preprocedural laboratory examination: Secondary | ICD-10-CM | POA: Insufficient documentation

## 2013-08-19 HISTORY — DX: Other specified postprocedural states: Z98.890

## 2013-08-19 HISTORY — DX: Other specified postprocedural states: R11.2

## 2013-08-19 LAB — CBC
HCT: 35.6 % — ABNORMAL LOW (ref 36.0–46.0)
Hemoglobin: 12 g/dL (ref 12.0–15.0)
MCH: 32.1 pg (ref 26.0–34.0)
Platelets: 301 10*3/uL (ref 150–400)
RBC: 3.74 MIL/uL — ABNORMAL LOW (ref 3.87–5.11)
RDW: 12.1 % (ref 11.5–15.5)
WBC: 6.2 10*3/uL (ref 4.0–10.5)

## 2013-08-19 LAB — BASIC METABOLIC PANEL
CO2: 28 mEq/L (ref 19–32)
Calcium: 9.5 mg/dL (ref 8.4–10.5)
GFR calc Af Amer: 85 mL/min — ABNORMAL LOW (ref >90)
GFR calc non Af Amer: 74 mL/min — ABNORMAL LOW (ref >90)
Glucose, Bld: 171 mg/dL — ABNORMAL HIGH (ref 70–99)
Potassium: 4.4 mEq/L (ref 3.5–5.1)
Sodium: 137 mEq/L (ref 135–145)

## 2013-08-19 LAB — SURGICAL PCR SCREEN
MRSA, PCR: NEGATIVE
Staphylococcus aureus: NEGATIVE

## 2013-08-19 LAB — PREPARE RBC (CROSSMATCH)

## 2013-08-23 NOTE — H&P (Signed)
TOTAL KNEE ADMISSION H&P  Patient is being admitted for right total knee arthroplasty.  Subjective:  HPI:  Chief Complaint   Patient presents with   .  Knee Pain       Bilateral knee pain     Ht 5\' 3"  (1.6 m)  Wt 203 lb (92.08 kg)  BMI 35.97 kg/m2  History: This is a 62 year old female who works at the [employer] and is on her feet all day presents with history of previous arthroscopy of the right knee 3 years ago she was found to have some arthritis in her knee at that time along with her torn cartilage. She now presents with bilateral knee pain with sharp dull throbbing burning pain which radiates into her tibia. Denies back pain numbness or tingling. She does have 5-10 out of 10 pain depending on if she's standing or walking. She has associated symptoms of locking catching swelling and bruising  She says her knees and her legs are her life in her life is being severely inhibited because it's difficult for her work. She can get up and down from a chair easily she can't kneel squat or bend her knees when she's working. She has a history of thyroid disease blood pressure hypertension diabetes. She's had a hysterectomy knee surgery. 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 Her review of systems she listed as depression only otherwise negative  Medications are aspirin hydrochlorothiazide levothyroxine losartan metformin venlafaxine  She's had 3 medications including naproxen ibuprofen and diclofenac without relief of her pain  We discussed this at length and she has decided to proceed with surgical treatment versus injection and pain medication.   Patient Active Problem List   Diagnosis Date Noted  . Bilateral knee pain 08/07/2013  . Microproteinuria 07/16/2013  . Type 2 diabetes mellitus 07/06/2013  . Essential hypertension, benign 07/06/2013  . Hypothyroidism  07/06/2013  . Medial meniscus, posterior horn derangement 01/19/2011  . OA (osteoarthritis) of knee 01/19/2011  . 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 Diabetes mellitus   . High blood pressure   . Hypothyroid   . Arthritis   . Obesity   . Depression   . PONV (postoperative nausea and vomiting)     Past Surgical History  Procedure Laterality Date  . Carpal tunnel release Right   . Thyroid surgery      removal  . Abdominal hysterectomy    . Bunions Right   . Knee arthroscopy Right     No prescriptions prior to admission   Allergies  Allergen Reactions  . Ace Inhibitors     Induced cough    History  Substance Use Topics  . Smoking status: Never Smoker   . Smokeless tobacco: Not on file  . Alcohol Use: No    Family History  Problem Relation Age of Onset  . Cancer    . Diabetes    . Congestive Heart Failure Father      ROS No significant postive ROS findings  Objective:  Physical Exam  Vital signs in last 24 hours:   Her overall body habitus is medium to large. She is well-groomed. Hygiene is normal. She is oriented x3. Mood is pleasant. Her ambulation is without significant limp.  Upper extremity exam  The right and left upper extremity:  Inspection and palpation revealed no abnormalities in the upper extremities.    Range of motion is full without contracture.  Motor exam is normal with grade 5 strength.  The joints are fully reduced without subluxation.  There is no atrophy or tremor and muscle tone is normal.  All joints are stable.   She has bilateral varus alignment to her knees with medial and lateral joint line tenderness her range of motion reveals flexion ARC of 115 the knees are stable quadriceps strength is normal skin is normal. Distal neurovascular function is intact she has no gross lymphadenopathy. No pathologic reflexes. Balance is   Labs:   Estimated body mass index is 35.97 kg/(m^2) as  calculated from the following:   Height as of 08/07/13: 5\' 3"  (1.6 m).   Weight as of 08/07/13: 92.08 kg (203 lb).   Imaging Review Plain radiographs demonstrate moderate degenerative joint disease of the right knee(s). The overall alignment ismild varus. The bone quality appears to be good for age and reported activity level.  Assessment/Plan:  End stage arthritis, right knee   The patient history, physical examination, clinical judgment of the provider and imaging studies are consistent with end stage degenerative joint disease of the right knee(s) and total knee arthroplasty is deemed medically necessary. The treatment options including medical management, injection therapy arthroscopy and arthroplasty were discussed at length. The risks and benefits of total knee arthroplasty were presented and reviewed. The risks due to aseptic loosening, infection, stiffness, patella tracking problems, thromboembolic complications and other imponderables were discussed. The patient acknowledged the explanation, agreed to proceed with the plan and consent was signed. Patient is being admitted for inpatient treatment for surgery, pain control, PT, OT, prophylactic antibiotics, VTE prophylaxis, progressive ambulation and ADL's and discharge planning. The patient is planning to be discharged home with home health services

## 2013-08-25 ENCOUNTER — Encounter (HOSPITAL_COMMUNITY)
Admission: RE | Disposition: A | Discharge status: Home Health | Payer: Self-pay | Source: Ambulatory Visit | Attending: Orthopedic Surgery

## 2013-08-25 ENCOUNTER — Inpatient Hospital Stay (HOSPITAL_COMMUNITY): Payer: 59

## 2013-08-25 ENCOUNTER — Inpatient Hospital Stay (HOSPITAL_COMMUNITY): Payer: 59 | Admitting: Anesthesiology

## 2013-08-25 ENCOUNTER — Encounter (HOSPITAL_COMMUNITY): Payer: 59 | Admitting: Anesthesiology

## 2013-08-25 ENCOUNTER — Inpatient Hospital Stay (HOSPITAL_COMMUNITY)
Admission: RE | Admit: 2013-08-25 | Discharge: 2013-08-27 | DRG: 470 | Disposition: A | Discharge status: Home Health | Payer: 59 | Source: Ambulatory Visit | Attending: Orthopedic Surgery | Admitting: Orthopedic Surgery

## 2013-08-25 ENCOUNTER — Encounter (HOSPITAL_COMMUNITY): Payer: Self-pay | Admitting: *Deleted

## 2013-08-25 DIAGNOSIS — Z8249 Family history of ischemic heart disease and other diseases of the circulatory system: Secondary | ICD-10-CM

## 2013-08-25 DIAGNOSIS — Z833 Family history of diabetes mellitus: Secondary | ICD-10-CM

## 2013-08-25 DIAGNOSIS — IMO0002 Reserved for concepts with insufficient information to code with codable children: Secondary | ICD-10-CM

## 2013-08-25 DIAGNOSIS — E119 Type 2 diabetes mellitus without complications: Secondary | ICD-10-CM | POA: Diagnosis present

## 2013-08-25 DIAGNOSIS — M1710 Unilateral primary osteoarthritis, unspecified knee: Principal | ICD-10-CM | POA: Diagnosis present

## 2013-08-25 DIAGNOSIS — I10 Essential (primary) hypertension: Secondary | ICD-10-CM | POA: Diagnosis present

## 2013-08-25 DIAGNOSIS — M179 Osteoarthritis of knee, unspecified: Secondary | ICD-10-CM | POA: Diagnosis present

## 2013-08-25 DIAGNOSIS — F329 Major depressive disorder, single episode, unspecified: Secondary | ICD-10-CM | POA: Diagnosis present

## 2013-08-25 DIAGNOSIS — E039 Hypothyroidism, unspecified: Secondary | ICD-10-CM | POA: Diagnosis present

## 2013-08-25 DIAGNOSIS — F3289 Other specified depressive episodes: Secondary | ICD-10-CM | POA: Diagnosis present

## 2013-08-25 HISTORY — PX: TOTAL KNEE ARTHROPLASTY: SHX125

## 2013-08-25 LAB — GLUCOSE, CAPILLARY
Glucose-Capillary: 143 mg/dL — ABNORMAL HIGH (ref 70–99)
Glucose-Capillary: 164 mg/dL — ABNORMAL HIGH (ref 70–99)

## 2013-08-25 SURGERY — ARTHROPLASTY, KNEE, TOTAL
Anesthesia: Spinal | Site: Knee | Laterality: Right

## 2013-08-25 MED ORDER — BUPIVACAINE LIPOSOME 1.3 % IJ SUSP
20.0000 mL | Freq: Once | INTRAMUSCULAR | Status: DC
  Filled 2013-08-25: qty 20

## 2013-08-25 MED ORDER — OXYCODONE-ACETAMINOPHEN 5-325 MG PO TABS
1.0000 | ORAL_TABLET | ORAL | Status: DC
  Administered 2013-08-25 – 2013-08-27 (×12): 1 via ORAL
  Filled 2013-08-25 (×12): qty 1

## 2013-08-25 MED ORDER — POLYETHYLENE GLYCOL 3350 17 G PO PACK
17.0000 g | PACK | Freq: Every day | ORAL | Status: DC
  Administered 2013-08-26 – 2013-08-27 (×2): 17 g via ORAL
  Filled 2013-08-25 (×2): qty 1

## 2013-08-25 MED ORDER — OXYCODONE HCL 5 MG PO TABS
5.0000 mg | ORAL_TABLET | ORAL | Status: DC | PRN
  Administered 2013-08-26: 5 mg via ORAL
  Administered 2013-08-26: 10 mg via ORAL
  Filled 2013-08-25: qty 2
  Filled 2013-08-25: qty 1

## 2013-08-25 MED ORDER — DEXAMETHASONE SODIUM PHOSPHATE 4 MG/ML IJ SOLN
4.0000 mg | Freq: Once | INTRAMUSCULAR | Status: AC
  Administered 2013-08-25: 4 mg via INTRAVENOUS

## 2013-08-25 MED ORDER — LACTATED RINGERS IV SOLN
INTRAVENOUS | Status: DC
Start: 2013-08-25 — End: 2013-08-25
  Administered 2013-08-25 (×2): via INTRAVENOUS

## 2013-08-25 MED ORDER — OXYCODONE HCL 5 MG PO TABS
ORAL_TABLET | ORAL | Status: AC
  Filled 2013-08-25: qty 1

## 2013-08-25 MED ORDER — MAGNESIUM CITRATE PO SOLN: 1.0000 | Freq: Once | ORAL | Status: AC | PRN

## 2013-08-25 MED ORDER — METOCLOPRAMIDE HCL 5 MG/ML IJ SOLN: 5.0000 mg | Freq: Three times a day (TID) | INTRAMUSCULAR | Status: DC | PRN

## 2013-08-25 MED ORDER — METOCLOPRAMIDE HCL 10 MG PO TABS: 5.0000 mg | ORAL_TABLET | Freq: Three times a day (TID) | ORAL | Status: DC | PRN

## 2013-08-25 MED ORDER — PROPOFOL 10 MG/ML IV BOLUS
INTRAVENOUS | Status: AC
  Filled 2013-08-25: qty 20

## 2013-08-25 MED ORDER — PREGABALIN 50 MG PO CAPS
50.0000 mg | ORAL_CAPSULE | Freq: Once | ORAL | Status: AC
  Administered 2013-08-25: 50 mg via ORAL

## 2013-08-25 MED ORDER — FENTANYL CITRATE 0.05 MG/ML IJ SOLN: 25.0000 ug | INTRAMUSCULAR | Status: DC | PRN

## 2013-08-25 MED ORDER — METHOCARBAMOL 100 MG/ML IJ SOLN
500.0000 mg | Freq: Once | INTRAVENOUS | Status: AC
  Administered 2013-08-25: 500 mg via INTRAVENOUS
  Filled 2013-08-25: qty 5

## 2013-08-25 MED ORDER — FENTANYL CITRATE 0.05 MG/ML IJ SOLN
INTRAMUSCULAR | Status: AC
  Filled 2013-08-25: qty 2

## 2013-08-25 MED ORDER — ASPIRIN EC 325 MG PO TBEC
325.0000 mg | DELAYED_RELEASE_TABLET | Freq: Two times a day (BID) | ORAL | Status: DC
  Administered 2013-08-25 – 2013-08-27 (×5): 325 mg via ORAL
  Filled 2013-08-25 (×5): qty 1

## 2013-08-25 MED ORDER — ONDANSETRON HCL 4 MG/2ML IJ SOLN
4.0000 mg | Freq: Once | INTRAMUSCULAR | Status: AC
  Administered 2013-08-25: 4 mg via INTRAVENOUS

## 2013-08-25 MED ORDER — CHLORHEXIDINE GLUCONATE 4 % EX LIQD: 60.0000 mL | Freq: Once | CUTANEOUS | Status: DC

## 2013-08-25 MED ORDER — BISACODYL 10 MG RE SUPP: 10.0000 mg | Freq: Every day | RECTAL | Status: DC | PRN

## 2013-08-25 MED ORDER — CEFAZOLIN SODIUM-DEXTROSE 2-3 GM-% IV SOLR
2.0000 g | Freq: Four times a day (QID) | INTRAVENOUS | Status: AC
  Administered 2013-08-25 (×2): 2 g via INTRAVENOUS
  Filled 2013-08-25 (×2): qty 50

## 2013-08-25 MED ORDER — CELECOXIB 400 MG PO CAPS
400.0000 mg | ORAL_CAPSULE | Freq: Once | ORAL | Status: AC
  Administered 2013-08-25: 400 mg via ORAL

## 2013-08-25 MED ORDER — SODIUM CHLORIDE 0.9 % IR SOLN
Status: DC | PRN
  Administered 2013-08-25: 3000 mL

## 2013-08-25 MED ORDER — DOCUSATE SODIUM 100 MG PO CAPS
100.0000 mg | ORAL_CAPSULE | Freq: Two times a day (BID) | ORAL | Status: DC
  Administered 2013-08-25 – 2013-08-27 (×5): 100 mg via ORAL
  Filled 2013-08-25 (×5): qty 1

## 2013-08-25 MED ORDER — VENLAFAXINE HCL ER 37.5 MG PO CP24
37.5000 mg | ORAL_CAPSULE | Freq: Every day | ORAL | Status: DC
  Administered 2013-08-26 – 2013-08-27 (×2): 37.5 mg via ORAL
  Filled 2013-08-25 (×4): qty 1

## 2013-08-25 MED ORDER — OXYCODONE HCL 5 MG PO TABS
5.0000 mg | ORAL_TABLET | Freq: Once | ORAL | Status: AC
  Administered 2013-08-25: 5 mg via ORAL

## 2013-08-25 MED ORDER — MIDAZOLAM HCL 5 MG/5ML IJ SOLN
INTRAMUSCULAR | Status: DC | PRN
  Administered 2013-08-25: 2 mg via INTRAVENOUS

## 2013-08-25 MED ORDER — ONDANSETRON HCL 4 MG/2ML IJ SOLN: 4.0000 mg | Freq: Four times a day (QID) | INTRAMUSCULAR | Status: DC | PRN

## 2013-08-25 MED ORDER — ALUM & MAG HYDROXIDE-SIMETH 200-200-20 MG/5ML PO SUSP: 30.0000 mL | ORAL | Status: DC | PRN

## 2013-08-25 MED ORDER — METHOCARBAMOL 100 MG/ML IJ SOLN
500.0000 mg | Freq: Four times a day (QID) | INTRAVENOUS | Status: DC | PRN
  Filled 2013-08-25: qty 5

## 2013-08-25 MED ORDER — LEVOTHYROXINE SODIUM 137 MCG PO TABS
137.0000 ug | ORAL_TABLET | Freq: Every day | ORAL | Status: DC
  Administered 2013-08-26 – 2013-08-27 (×2): 137 ug via ORAL
  Filled 2013-08-25 (×4): qty 1

## 2013-08-25 MED ORDER — FENTANYL CITRATE 0.05 MG/ML IJ SOLN
25.0000 ug | INTRAMUSCULAR | Status: AC
  Administered 2013-08-25 (×2): 25 ug via INTRAVENOUS

## 2013-08-25 MED ORDER — ONDANSETRON HCL 4 MG PO TABS: 4.0000 mg | ORAL_TABLET | Freq: Four times a day (QID) | ORAL | Status: DC | PRN

## 2013-08-25 MED ORDER — ONDANSETRON HCL 4 MG/2ML IJ SOLN: 4.0000 mg | Freq: Once | INTRAMUSCULAR | Status: DC | PRN

## 2013-08-25 MED ORDER — SENNA 8.6 MG PO TABS
1.0000 | ORAL_TABLET | Freq: Two times a day (BID) | ORAL | Status: DC
  Administered 2013-08-25 – 2013-08-27 (×5): 8.6 mg via ORAL
  Filled 2013-08-25 (×5): qty 1

## 2013-08-25 MED ORDER — BUPIVACAINE IN DEXTROSE 0.75-8.25 % IT SOLN
INTRATHECAL | Status: AC
  Filled 2013-08-25: qty 2

## 2013-08-25 MED ORDER — DIPHENHYDRAMINE HCL 12.5 MG/5ML PO ELIX: 12.5000 mg | ORAL_SOLUTION | ORAL | Status: DC | PRN

## 2013-08-25 MED ORDER — METHOCARBAMOL 500 MG PO TABS
500.0000 mg | ORAL_TABLET | Freq: Four times a day (QID) | ORAL | Status: DC | PRN
  Administered 2013-08-26: 500 mg via ORAL
  Filled 2013-08-25: qty 1

## 2013-08-25 MED ORDER — PROPOFOL 10 MG/ML IV BOLUS
INTRAVENOUS | Status: DC | PRN
  Administered 2013-08-25: 8 mg via INTRAVENOUS
  Administered 2013-08-25 (×2): 16 mg via INTRAVENOUS
  Administered 2013-08-25: 8 mg via INTRAVENOUS

## 2013-08-25 MED ORDER — CELECOXIB 100 MG PO CAPS
200.0000 mg | ORAL_CAPSULE | Freq: Two times a day (BID) | ORAL | Status: DC
  Administered 2013-08-25 – 2013-08-27 (×4): 200 mg via ORAL
  Filled 2013-08-25 (×4): qty 2

## 2013-08-25 MED ORDER — ONDANSETRON HCL 4 MG/2ML IJ SOLN
INTRAMUSCULAR | Status: AC
  Filled 2013-08-25: qty 2

## 2013-08-25 MED ORDER — SODIUM CHLORIDE 0.9 % IJ SOLN
INTRAMUSCULAR | Status: DC | PRN
  Administered 2013-08-25: 09:00:00

## 2013-08-25 MED ORDER — SODIUM CHLORIDE 0.9 % IR SOLN
Status: DC | PRN
  Administered 2013-08-25: 1000 mL

## 2013-08-25 MED ORDER — BUPIVACAINE-EPINEPHRINE (PF) 0.5% -1:200000 IJ SOLN
INTRAMUSCULAR | Status: DC | PRN
  Administered 2013-08-25: 30 mL

## 2013-08-25 MED ORDER — LOSARTAN POTASSIUM 50 MG PO TABS
100.0000 mg | ORAL_TABLET | Freq: Every day | ORAL | Status: DC
  Administered 2013-08-25 – 2013-08-27 (×3): 100 mg via ORAL
  Filled 2013-08-25 (×3): qty 2

## 2013-08-25 MED ORDER — PREGABALIN 50 MG PO CAPS
ORAL_CAPSULE | ORAL | Status: AC
  Filled 2013-08-25: qty 1

## 2013-08-25 MED ORDER — CEFAZOLIN SODIUM-DEXTROSE 2-3 GM-% IV SOLR
INTRAVENOUS | Status: AC
  Filled 2013-08-25: qty 50

## 2013-08-25 MED ORDER — MIDAZOLAM HCL 2 MG/2ML IJ SOLN
INTRAMUSCULAR | Status: AC
  Filled 2013-08-25: qty 2

## 2013-08-25 MED ORDER — CELECOXIB 400 MG PO CAPS
ORAL_CAPSULE | ORAL | Status: AC
  Filled 2013-08-25: qty 1

## 2013-08-25 MED ORDER — BUPIVACAINE-EPINEPHRINE PF 0.5-1:200000 % IJ SOLN
INTRAMUSCULAR | Status: AC
  Filled 2013-08-25: qty 10

## 2013-08-25 MED ORDER — BUPIVACAINE IN DEXTROSE 0.75-8.25 % IT SOLN
INTRATHECAL | Status: DC | PRN
  Administered 2013-08-25: 12 mg via INTRATHECAL

## 2013-08-25 MED ORDER — METFORMIN HCL 500 MG PO TABS
500.0000 mg | ORAL_TABLET | Freq: Two times a day (BID) | ORAL | Status: DC
  Administered 2013-08-25 – 2013-08-27 (×4): 500 mg via ORAL
  Filled 2013-08-25 (×4): qty 1

## 2013-08-25 MED ORDER — PHENOL 1.4 % MT LIQD: 1.0000 | OROMUCOSAL | Status: DC | PRN

## 2013-08-25 MED ORDER — MENTHOL 3 MG MT LOZG: 1.0000 | LOZENGE | OROMUCOSAL | Status: DC | PRN

## 2013-08-25 MED ORDER — DEXAMETHASONE SODIUM PHOSPHATE 4 MG/ML IJ SOLN
INTRAMUSCULAR | Status: AC
  Filled 2013-08-25: qty 1

## 2013-08-25 MED ORDER — SODIUM CHLORIDE 0.9 % IV SOLN
INTRAVENOUS | Status: DC
  Administered 2013-08-25 – 2013-08-26 (×2): via INTRAVENOUS

## 2013-08-25 MED ORDER — SCOPOLAMINE 1 MG/3DAYS TD PT72
MEDICATED_PATCH | TRANSDERMAL | Status: AC
  Filled 2013-08-25: qty 1

## 2013-08-25 MED ORDER — MIDAZOLAM HCL 2 MG/2ML IJ SOLN
1.0000 mg | INTRAMUSCULAR | Status: DC | PRN
  Administered 2013-08-25: 2 mg via INTRAVENOUS

## 2013-08-25 MED ORDER — SCOPOLAMINE 1 MG/3DAYS TD PT72
1.0000 | MEDICATED_PATCH | Freq: Once | TRANSDERMAL | Status: DC
  Administered 2013-08-25: 1.5 mg via TRANSDERMAL

## 2013-08-25 MED ORDER — PROPOFOL INFUSION 10 MG/ML OPTIME
INTRAVENOUS | Status: DC | PRN
  Administered 2013-08-25: 09:00:00 via INTRAVENOUS
  Administered 2013-08-25: 25 ug/kg/min via INTRAVENOUS

## 2013-08-25 MED ORDER — FENTANYL CITRATE 0.05 MG/ML IJ SOLN
INTRAMUSCULAR | Status: DC | PRN
  Administered 2013-08-25: 25 ug via INTRAVENOUS
  Administered 2013-08-25: 25 ug via INTRATHECAL
  Administered 2013-08-25 (×2): 25 ug via INTRAVENOUS

## 2013-08-25 MED ORDER — CEFAZOLIN SODIUM-DEXTROSE 2-3 GM-% IV SOLR
2.0000 g | INTRAVENOUS | Status: AC
  Administered 2013-08-25: 2 g via INTRAVENOUS

## 2013-08-25 MED ORDER — TRANEXAMIC ACID 100 MG/ML IV SOLN
1000.0000 mg | INTRAVENOUS | Status: AC
  Administered 2013-08-25: 1000 mg via INTRAVENOUS
  Filled 2013-08-25: qty 10

## 2013-08-25 MED ORDER — SODIUM CHLORIDE 0.9 % IJ SOLN
INTRAMUSCULAR | Status: AC
  Filled 2013-08-25: qty 40

## 2013-08-25 MED ORDER — HYDROMORPHONE HCL PF 1 MG/ML IJ SOLN
0.5000 mg | INTRAMUSCULAR | Status: DC | PRN
  Administered 2013-08-26 (×2): 0.5 mg via INTRAVENOUS
  Filled 2013-08-25 (×2): qty 1

## 2013-08-25 SURGICAL SUPPLY — 66 items
BAG HAMPER (MISCELLANEOUS) ×2 IMPLANT
BANDAGE ESMARK 6X9 LF (GAUZE/BANDAGES/DRESSINGS) ×1 IMPLANT
BIT DRILL 3.2X128 (BIT) IMPLANT
BLADE HEX COATED 2.75 (ELECTRODE) ×2 IMPLANT
BLADE SAG 18X100X1.27 (BLADE) ×2 IMPLANT
BLADE SAGITTAL 25.0X1.27X90 (BLADE) IMPLANT
BLADE SAW SAG 90X13X1.27 (BLADE) ×2 IMPLANT
BNDG ESMARK 6X9 LF (GAUZE/BANDAGES/DRESSINGS) ×2
BOWL SMART MIX CTS (DISPOSABLE) IMPLANT
CAPT FB KNEE W/COCR XLINK ×2 IMPLANT
CHLORAPREP W/TINT 26ML (MISCELLANEOUS) ×4 IMPLANT
CLOTH BEACON ORANGE TIMEOUT ST (SAFETY) ×2 IMPLANT
COVER LIGHT HANDLE STERIS (MISCELLANEOUS) ×4 IMPLANT
COVER PROBE W GEL 5X96 (DRAPES) ×2 IMPLANT
CUFF TOURNIQUET SINGLE 34IN LL (TOURNIQUET CUFF) ×2 IMPLANT
CUFF TOURNIQUET SINGLE 44IN (TOURNIQUET CUFF) IMPLANT
DECANTER SPIKE VIAL GLASS SM (MISCELLANEOUS) ×2 IMPLANT
DRAPE BACK TABLE (DRAPES) ×2 IMPLANT
DRAPE EXTREMITY T 121X128X90 (DRAPE) ×2 IMPLANT
DRSG MEPILEX BORDER 4X12 (GAUZE/BANDAGES/DRESSINGS) ×2 IMPLANT
DURAPREP 26ML APPLICATOR (WOUND CARE) IMPLANT
ELECT REM PT RETURN 9FT ADLT (ELECTROSURGICAL) ×2
ELECTRODE REM PT RTRN 9FT ADLT (ELECTROSURGICAL) ×1 IMPLANT
EVACUATOR 3/16  PVC DRAIN (DRAIN) ×1
EVACUATOR 3/16 PVC DRAIN (DRAIN) ×1 IMPLANT
FACESHIELD LNG OPTICON STERILE (SAFETY) ×2 IMPLANT
GLOVE BIOGEL PI IND STRL 7.0 (GLOVE) ×3 IMPLANT
GLOVE BIOGEL PI INDICATOR 7.0 (GLOVE) ×3
GLOVE ECLIPSE 6.5 STRL STRAW (GLOVE) ×2 IMPLANT
GLOVE ECLIPSE 7.0 STRL STRAW (GLOVE) ×2 IMPLANT
GLOVE EXAM NITRILE MD LF STRL (GLOVE) ×6 IMPLANT
GLOVE OPTIFIT SS 8.0 STRL (GLOVE) ×2 IMPLANT
GLOVE SKINSENSE NS SZ8.0 LF (GLOVE) ×2
GLOVE SKINSENSE STRL SZ8.0 LF (GLOVE) ×2 IMPLANT
GLOVE SS N UNI LF 8.5 STRL (GLOVE) ×2 IMPLANT
GOWN STRL REIN XL XLG (GOWN DISPOSABLE) ×8 IMPLANT
HANDPIECE INTERPULSE COAX TIP (DISPOSABLE) ×1
HOOD W/PEELAWAY (MISCELLANEOUS) ×8 IMPLANT
INST SET MAJOR BONE (KITS) ×2 IMPLANT
IV NS IRRIG 3000ML ARTHROMATIC (IV SOLUTION) ×2 IMPLANT
KIT BLADEGUARD II DBL (SET/KITS/TRAYS/PACK) ×2 IMPLANT
KIT ROOM TURNOVER APOR (KITS) ×2 IMPLANT
MANIFOLD NEPTUNE II (INSTRUMENTS) ×2 IMPLANT
MARKER SKIN DUAL TIP RULER LAB (MISCELLANEOUS) ×2 IMPLANT
NEEDLE HYPO 21X1.5 SAFETY (NEEDLE) ×2 IMPLANT
NEEDLE HYPO 25X1 1.5 SAFETY (NEEDLE) ×2 IMPLANT
NS IRRIG 1000ML POUR BTL (IV SOLUTION) ×2 IMPLANT
PACK TOTAL JOINT (CUSTOM PROCEDURE TRAY) ×2 IMPLANT
PAD ARMBOARD 7.5X6 YLW CONV (MISCELLANEOUS) ×2 IMPLANT
PAD DANNIFLEX CPM (ORTHOPEDIC SUPPLIES) ×2 IMPLANT
PIN TROCAR 3 INCH (PIN) ×2 IMPLANT
SET BASIN LINEN APH (SET/KITS/TRAYS/PACK) ×2 IMPLANT
SET HNDPC FAN SPRY TIP SCT (DISPOSABLE) ×1 IMPLANT
SPONGE DRAIN TRACH 4X4 STRL 2S (GAUZE/BANDAGES/DRESSINGS) ×2 IMPLANT
STAPLER VISISTAT 35W (STAPLE) ×2 IMPLANT
SUT BRALON NAB BRD #1 30IN (SUTURE) ×4 IMPLANT
SUT MON AB 0 CT1 (SUTURE) ×2 IMPLANT
SUT MON AB 2-0 CT1 36 (SUTURE) ×2 IMPLANT
SYR 20CC LL (SYRINGE) ×8 IMPLANT
SYR 30ML LL (SYRINGE) ×2 IMPLANT
SYR BULB IRRIGATION 50ML (SYRINGE) ×2 IMPLANT
TOWEL OR 17X26 4PK STRL BLUE (TOWEL DISPOSABLE) ×2 IMPLANT
TOWER CARTRIDGE SMART MIX (DISPOSABLE) ×2 IMPLANT
TRAY FOLEY CATH 16FR SILVER (SET/KITS/TRAYS/PACK) ×2 IMPLANT
WATER STERILE IRR 1000ML POUR (IV SOLUTION) ×8 IMPLANT
YANKAUER SUCT 12FT TUBE ARGYLE (SUCTIONS) ×2 IMPLANT

## 2013-08-25 NOTE — Brief Op Note (Addendum)
08/25/2013  9:52 AM  PATIENT:  Kimberly [name]  62 y.o. female  PRE-OPERATIVE DIAGNOSIS:  Osteoarthritis Right Knee  POST-OPERATIVE DIAGNOSIS:  Osteoarthritis Right Knee  FINDINGS SEVERE OA MEDIAL AND PATELLOFEMORAL COMPARTMENTS  IMPLANTS DEPUY SIGMA PS FB 3 F, 2.5 T, 38 PATELA, 15 INSERT  PROCEDURE:  Procedure(s): TOTAL KNEE ARTHROPLASTY (Right)  SURGEON:  Surgeon(s) and Role:    * Vickki Hearing, MD - Primary  PHYSICIAN ASSISTANT:   ASSISTANTS: wayne mcfatter and betty ashley    ANESTHESIA:   spinal  EBL:  Total I/O In: 1000 [I.V.:1000] Out: 475 [Urine:400; Blood:75]  BLOOD ADMINISTERED:none  DRAINS: 1 hemovac    LOCAL MEDICATIONS USED:  MARCAINE   , Amount: 30 ml and OTHER exparel 20/40  SPECIMEN:  No Specimen  DISPOSITION OF SPECIMEN:  N/A  COUNTS:  YES  TOURNIQUET:   Total Tourniquet Time Documented: Thigh (Right) - 82 minutes Total: Thigh (Right) - 82 minutes   DICTATION: .Reubin Milan Dictation  PLAN OF CARE: Admit to inpatient   PATIENT DISPOSITION:  PACU - hemodynamically stable.   Delay start of Pharmacological VTE agent (>24hrs) due to surgical blood loss or risk of bleeding: yes

## 2013-08-25 NOTE — Anesthesia Procedure Notes (Signed)
Spinal  Patient location during procedure: OR Start time: 08/25/2013 7:56 AM Staffing CRNA/Resident: Glynn Octave E Preanesthetic Checklist Completed: patient identified, site marked, surgical consent, pre-op evaluation, timeout performed, IV checked, risks and benefits discussed and monitors and equipment checked Spinal Block Patient position: right lateral decubitus Prep: Betadine Patient monitoring: heart rate, cardiac monitor, continuous pulse ox and blood pressure Approach: right paramedian Location: L3-4 Injection technique: single-shot Needle Needle type: Spinocan  Needle gauge: 22 G Needle length: 9 cm Assessment Sensory level: T8 Additional Notes ATTEMPTS:1 TRAY QM:[id number] TRAY EXPIRATION DATE:08/2014

## 2013-08-25 NOTE — Op Note (Signed)
08/25/2013  9:52 AM  PATIENT:  Kimberly [name]  62 y.o. female  PRE-OPERATIVE DIAGNOSIS:  Osteoarthritis Right Knee  POST-OPERATIVE DIAGNOSIS:  Osteoarthritis Right Knee  FINDINGS SEVERE OA MEDIAL AND PATELLOFEMORAL COMPARTMENTS  IMPLANTS DEPUY SIGMA PS FB 3 F, 2.5 T, 38 PATELA, 15 INSERT  PROCEDURE:  Procedure(s): TOTAL KNEE ARTHROPLASTY (Right)  SURGEON:  Surgeon(s) and Role:    * Vickki Hearing, MD - Primary  PHYSICIAN ASSISTANT:   ASSISTANTS: wayne mcfatter and betty ashley    ANESTHESIA:   spinal  EBL:  Total I/O In: 1000 [I.V.:1000] Out: 475 [Urine:400; Blood:75]  BLOOD ADMINISTERED:none  DRAINS: 1 hemovac    LOCAL MEDICATIONS USED:  MARCAINE   , Amount: 30 ml and OTHER exparel 20/40  SPECIMEN:  No Specimen  DISPOSITION OF SPECIMEN:  N/A  COUNTS:  YES  TOURNIQUET:   Total Tourniquet Time Documented: Thigh (Right) - 82 minutes Total: Thigh (Right) - 82 minutes   DICTATION: .Reubin Milan Dictation Tourniquet time , pressure 300 mm of mercury  Indications for procedure disabling knee pain, failure to control pain with nonoperative measures  Details of procedure:  In the preop area the patient's RIGHT  knee was marked and countersigned by the surgeon, the chart was updated, consent was signed  The patient was taken to the operating room for spinal anesthetic followed by administering 2 g of Ancef based on weight of 92KG kg  A Foley catheter was inserted sterilely, then the RIGHT LEG  was prepped and draped sterilely  The timeout was completed  The limb was then exsanguinated with a six-inch Esmarch with the knee in flexion and the tourniquet was elevated to 300 mm of mercury. A midline incision was made, the subcutaneous tissues were divided down to the extensor mechanism. A medial arthrotomy was performed, the patella was everted,  the fat pad was resected. The medial and  lateral menisci were resected. The medial soft tissue sleeve was  elevated to the mid coronal plane. The anterior cruciate ligament and PCL were resected. Osteophytes were removed. The distal femur anterior surface was skeletonized with sharp dissection.  A three-eighths inch drill bit was used to enter the femoral canal which was suctioned and irrigated until the fluid was clear;  an intramedullary rod was placed in the femur with a 5 , DISTAL RESECTION -RIGHT  setting, the block was pinned in place; then a 10mm distal femoral resection was performed. The cut was checked for flatness.  The sizing guide was then placed on the femur, the femur measured a size 3; the block was pinned in external rotation 3 using the epicondyles as reference; a  4-in-1 cutting block was placed and the 4 cuts were made with retractors protecting the collateral ligaments. The posterior osteophytes were removed with a curved osteotome. Residual PCL tissue was resected; residual meniscal tissue was resected.  The external tibial alignment instrument was set for tibial resection. The guide was   placed referencing the medial side which was the worn side and the stylus was set at 2 mm resection. Anterior slope was built in to match the patients anatomy; a neutral varus valgus cut was set using the medial third of the tibial tubercle as reference along with the medial portion of the lateral tibial spine. The guide was stabilized with pins.  A saw was used to resect the anterior tibia. The tibia was sized to a size 2.5 base plate.  We then balanced the knee using spacer blocks and laminar  spreaders. The knee was stable and balanced in flexion and extension  with a spacer.   The size 3 box cutting guide was placed to resect the bone for the post. We then turned our attention to the patella.  The patella measured 23 mm in thickness; we set the guide to leave 15 mm of patella.  After resection of the patella,  It was remeasured, and measured 15 mm. I sized the patella to a  38. We then  drilled the 3 peg holes.   We then did a trial reduction. The trial reduction confirmed that the knee  balanced in extension and balance in flexion. Passive flexion equalled 110, and patella tracking was normal.  The tibial rotation was set avoiding internal rotation, allowing congruency with the femur.  We then punched the tibia per technique with a 2.5 base plate.   The bone was then irrigated and dried while cement was mixed on the back table. The implants were checked for accuracy and then cemented in place; excess cement was removed; the cement was allowed to cure. The wound was then irrigated with copious amounts of saline, the posterior capsule was injected with 20 cc of DILUTED EXPAREL followed by 40 cc in the soft tissue. The 15 insert was placed. The range of motion and stability matched trial reduction  The capsule was closed with #1 Bralon in interrupted and running fashion and then the joint was injected with 30 cc of Marcaine with epinephrine.  The subcutaneous tissues were closed with  0 Monocryl and 2-0 Monocryl in running fashion  Staples were used to reapproximate the skin edges.  Sterile dressings were applied.   The patient was then taken to the recovery room in stable condition.  Routine postop plan for knee replacement.    PLAN OF CARE: Admit to inpatient   PATIENT DISPOSITION:  PACU - hemodynamically stable.   Delay start of Pharmacological VTE agent (>24hrs) due to surgical blood loss or risk of bleeding: yes

## 2013-08-25 NOTE — Clinical Social Work Note (Signed)
CSW received referral for possible placement. H&P indicates plan is for return home with home health. Per PT, this should be appropriate. CSW will sign off, but can be reconsulted if needed.  Derenda Fennel, [place] [phone]

## 2013-08-25 NOTE — Anesthesia Postprocedure Evaluation (Signed)
  Anesthesia Post-op Note  Patient: Kimberly [name]  Procedure(s) Performed: Procedure(s): TOTAL KNEE ARTHROPLASTY (Right)  Patient Location: PACU  Anesthesia Type:Spinal  Level of Consciousness: awake, alert  and oriented  Airway and Oxygen Therapy: Patient Spontanous Breathing  Post-op Pain: none  Post-op Assessment: Post-op Vital signs reviewed, Patient's Cardiovascular Status Stable, Respiratory Function Stable, Patent Airway and No signs of Nausea or vomiting  Post-op Vital Signs: Reviewed and stable  Complications: No apparent anesthesia complications

## 2013-08-25 NOTE — Transfer of Care (Signed)
Immediate Anesthesia Transfer of Care Note  Patient: Kimberly [name]  Procedure(s) Performed: Procedure(s): TOTAL KNEE ARTHROPLASTY (Right)  Patient Location: PACU  Anesthesia Type:Spinal  Level of Consciousness: awake, alert  and oriented  Airway & Oxygen Therapy: Patient Spontanous Breathing  Post-op Assessment: Report given to PACU RN  Post vital signs: Reviewed and stable  Complications: No apparent anesthesia complications

## 2013-08-25 NOTE — Evaluation (Signed)
Physical Therapy Evaluation Patient Details Name: Kimberly [name] MRN: [id number] DOB: [birth year] Today's Date: 08/25/2013 Time: 5956-3875 PT Time Calculation (min): 43 min  PT Assessment / Plan / Recommendation History of Present Illness  Pt is admitted for elective right TKR.  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  Clinical Impression   Pt was seen for initial eval/tx.  She is alert and oriented, very pleasant.  She was able to attend the total joint replacement class at [facility].  Her pain was adequately controlled, with ice applied to the knee.  We instructed in TKR exercise and had 0-72 degrees of right knee ROM (AA).  She was able to transfer and ambulate with a walker 20' with supervision.  She was able to [facility] on the RLE.  She should progress rapidly through the rehab process and should not have difficulty transitioning to home.    PT Assessment  Patient needs continued PT services    Follow Up Recommendations  Home health PT    Does the patient have the potential to tolerate intense rehabilitation      Barriers to Discharge        Equipment Recommendations   (may need a rolling walker and BSC??)    Recommendations for Other Services     Frequency 7X/week    Precautions / Restrictions Precautions Precautions: Fall Restrictions Weight Bearing Restrictions: No   Pertinent Vitals/Pain       Mobility  Bed Mobility Bed Mobility: Supine to Sit Supine to Sit: 4: Min assist Transfers Transfers: Sit to Stand;Stand to Sit;Stand Pivot Transfers Sit to Stand: 5: Supervision;With upper extremity assist Stand to Sit: 5: Supervision;With upper extremity assist Stand Pivot Transfers: 5: Supervision Ambulation/Gait Ambulation/Gait Assistance: 5: Supervision Ambulation Distance (Feet): 20 Feet Assistive device: Rolling walker Gait Pattern: Antalgic Gait velocity: WNL Stairs: No Wheelchair Mobility Wheelchair Mobility: No     Exercises Total Joint Exercises Ankle Circles/Pumps: AROM;Both;10 reps;Supine Quad Sets: AROM;Both;10 reps;Supine Short Arc Quad: AAROM;Right;10 reps;Supine Heel Slides: AAROM;Right;10 reps;Supine   PT Diagnosis: Difficulty walking;Abnormality of gait;Generalized weakness;Acute pain  PT Problem List: Decreased strength;Decreased range of motion;Decreased activity tolerance;Decreased mobility;Decreased knowledge of use of DME;Pain PT Treatment Interventions: Gait training;Stair training;Functional mobility training;Therapeutic exercise;Patient/family education     PT Goals(Current goals can be found in the care plan section) Acute Rehab PT Goals Patient Stated Goal: return to independence PT Goal Formulation: With patient/family Time For Goal Achievement: 09/01/13 Potential to Achieve Goals: Good  Visit Information  Last PT Received On: 08/25/13 History of Present Illness: Pt is admitted for elective right TKR.  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       Prior Functioning  Home Living Family/patient expects to be discharged to:: Private residence Living Arrangements: Spouse/significant other Available Help at Discharge: Family;Available 24 hours/day Type of Home: House Home Access: Stairs to enter [employer] of Steps: 4 Entrance Stairs-Rails: Right;Left Home Layout: One level Home Equipment: None Additional Comments: pt thinks that she can borrow a walker and BSC Prior Function Level of Independence: Independent Communication Communication: No difficulties    Cognition  Cognition Arousal/Alertness: Awake/alert Behavior During Therapy: WFL for tasks assessed/performed Overall Cognitive Status: Within Functional Limits for tasks assessed    Extremity/Trunk Assessment Lower Extremity Assessment Lower Extremity Assessment: RLE deficits/detail RLE Deficits / Details: AA ROM of knee =0-72 degrees   Balance  Balance Balance Assessed: No  End of Session PT -  End of Session Equipment Utilized During Treatment: Gait belt Activity Tolerance: Patient tolerated treatment well Patient left: in chair;with call bell/phone within reach;with family/visitor present Nurse Communication: Mobility status  GP     Konrad Penta 08/25/2013, 5:12 PM

## 2013-08-25 NOTE — Anesthesia Preprocedure Evaluation (Signed)
Anesthesia Evaluation  Patient identified by MRN, date of birth, ID band Patient awake    Reviewed: Allergy & Precautions, H&P , NPO status , Patient's Chart, lab work & pertinent test results  History of Anesthesia Complications (+) PONV and history of anesthetic complications  Airway Mallampati: II TM Distance: >3 FB     Dental  (+) Teeth Intact and Implants   Pulmonary  breath sounds clear to auscultation        Cardiovascular Pt. on medications Rhythm:Regular Rate:Normal     Neuro/Psych PSYCHIATRIC DISORDERS Depression  Neuromuscular disease    GI/Hepatic negative GI ROS,   Endo/Other  diabetes, Well Controlled, Type 2, Oral Hypoglycemic AgentsHypothyroidism   Renal/GU      Musculoskeletal   Abdominal   Peds  Hematology   Anesthesia Other Findings   Reproductive/Obstetrics                           Anesthesia Physical Anesthesia Plan  ASA: III  Anesthesia Plan: Spinal   Post-op Pain Management:    Induction:   Airway Management Planned: Nasal Cannula  Additional Equipment:   Intra-op Plan:   Post-operative Plan:   Informed Consent: I have reviewed the patients History and Physical, chart, labs and discussed the procedure including the risks, benefits and alternatives for the proposed anesthesia with the patient or authorized representative who has indicated his/her understanding and acceptance.     Plan Discussed with:   Anesthesia Plan Comments:         Anesthesia Quick Evaluation

## 2013-08-25 NOTE — Addendum Note (Signed)
Addendum created 08/25/13 1001 by Moshe Salisbury, CRNA   Modules edited: Anesthesia Medication Administration

## 2013-08-25 NOTE — Interval H&P Note (Signed)
History and Physical Interval Note:  08/25/2013 7:26 AM  Kimberly [name]  has presented today for surgery, with the diagnosis of Osteoarthritis Right Knee  The various methods of treatment have been discussed with the patient and family. After consideration of risks, benefits and other options for treatment, the patient has consented to  Procedure(s): TOTAL KNEE ARTHROPLASTY (Right) as a surgical intervention .  The patient's history has been reviewed, patient examined, no change in status, stable for surgery.  I have reviewed the patient's chart and labs.  Questions were answered to the patient's satisfaction.     Fuller Canada

## 2013-08-26 ENCOUNTER — Telehealth: Payer: Self-pay | Admitting: Orthopedic Surgery

## 2013-08-26 LAB — CBC
HCT: 30.5 % — ABNORMAL LOW (ref 36.0–46.0)
MCH: 32.6 pg (ref 26.0–34.0)
MCV: 95.6 fL (ref 78.0–100.0)
Platelets: 258 10*3/uL (ref 150–400)
RBC: 3.19 MIL/uL — ABNORMAL LOW (ref 3.87–5.11)
RDW: 12.3 % (ref 11.5–15.5)

## 2013-08-26 LAB — BASIC METABOLIC PANEL
BUN: 20 mg/dL (ref 6–23)
CO2: 25 mEq/L (ref 19–32)
Calcium: 8.5 mg/dL (ref 8.4–10.5)
Creatinine, Ser: 0.77 mg/dL (ref 0.50–1.10)
Glucose, Bld: 139 mg/dL — ABNORMAL HIGH (ref 70–99)
Potassium: 4.2 mEq/L (ref 3.7–5.3)

## 2013-08-26 LAB — GLUCOSE, CAPILLARY
Glucose-Capillary: 147 mg/dL — ABNORMAL HIGH (ref 70–99)
Glucose-Capillary: 129 mg/dL — ABNORMAL HIGH (ref 70–99)

## 2013-08-26 NOTE — Telephone Encounter (Signed)
Received call from Amy, care manager, [facility], for order for CPM, states not locating.  Please advise - her direct phone # is [phone].

## 2013-08-26 NOTE — Progress Notes (Signed)
OT Cancellation Note  Patient Details Name: Kimberly [name] MRN: [id number] DOB: [birth year]   Cancelled Treatment:    Reason Eval/Treat Not Completed: OT screened, no needs identified, will sign off (Patient demos/reports baseline wtih ADL tasks with complaint of knee pain and use of  DME)  Velora Mediate, OTR/L  08/26/2013, 12:08 PM

## 2013-08-26 NOTE — Anesthesia Postprocedure Evaluation (Signed)
  Anesthesia Post-op Note  Patient: Kimberly [name]  Procedure(s) Performed: Procedure(s): TOTAL KNEE ARTHROPLASTY (Right)  Patient Location: Room 331  Anesthesia Type:Spinal  Level of Consciousness: awake, alert , oriented and patient cooperative  Airway and Oxygen Therapy: Patient Spontanous Breathing  Post-op Pain: mild  Post-op Assessment: Post-op Vital signs reviewed, Patient's Cardiovascular Status Stable, Respiratory Function Stable, Patent Airway, No signs of Nausea or vomiting and Pain level controlled  Post-op Vital Signs: Reviewed and stable  Complications: No apparent anesthesia complications

## 2013-08-26 NOTE — Progress Notes (Signed)
Physical Therapy Treatment Patient Details Name: Kimberly [name] MRN: [id number] DOB: [birth year] Today's Date: 08/26/2013 Time: 4098-1191 PT Time Calculation (min): 49 min  PT Assessment / Plan / Recommendation  History of Present Illness Pt is admitted for elective right TKR.  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   PT Comments   Pt is progressing well with pain under good control, moderate right knee edema.  Right knee ROM=8-76 degrees, AA.  She is able to hold right knee in full extension isometrically.  She wants to try steps tomorrow AM...gait is excellent with a walker.  We are continuing teaching about TKR precautions.   CPM initiated this afternoon and tolerated well.  Follow Up Recommendations        Does the patient have the potential to tolerate intense rehabilitation     Barriers to Discharge        Equipment Recommendations  Rolling walker with 5" wheels;3in1 (PT)    Recommendations for Other Services    Frequency     Progress towards PT Goals Progress towards PT goals: Progressing toward goals  Plan Current plan remains appropriate    Precautions / Restrictions     Pertinent Vitals/Pain     Mobility  Bed Mobility Bed Mobility: Sit to Supine Supine to Sit: 5: Supervision Sit to Supine: 5: Supervision Transfers Sit to Stand: 6: Modified independent (Device/Increase time);From bed Stand to Sit: 6: Modified independent (Device/Increase time);To bed Ambulation/Gait Ambulation/Gait Assistance: 5: Supervision Ambulation Distance (Feet): 100 Feet Assistive device: Rolling walker Gait Pattern: Within Functional Limits    Exercises Total Joint Exercises Ankle Circles/Pumps: AROM;Both;10 reps;Supine Quad Sets: AROM;Both;10 reps;Supine Short Arc Quad: AAROM;Right;10 reps;Supine Heel Slides: AAROM;Right;10 reps Goniometric ROM: right knee ROM=8-76 degrees, AA   PT Diagnosis:    PT Problem List:   PT Treatment  Interventions:     PT Goals (current goals can now be found in the care plan section)    Visit Information  Last PT Received On: 08/26/13 History of Present Illness: Pt is admitted for elective right TKR.  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    Subjective Data      Cognition       Balance     End of Session PT - End of Session Equipment Utilized During Treatment: Gait belt Activity Tolerance: Patient tolerated treatment well Patient left: in bed;in CPM CPM Right Knee CPM Right Knee: On Right Knee Flexion (Degrees): 60 Right Knee Extension (Degrees): 0 Additional Comments: RN alerted to remove CPM after 6-8 hours   GP     Konrad Penta 08/26/2013, 3:24 PM

## 2013-08-26 NOTE — Progress Notes (Signed)
Subjective: 1 Day Post-Op Procedure(s) (LRB): TOTAL KNEE ARTHROPLASTY (Right) Patient reports pain as moderate.    Objective: Vital signs in last 24 hours: Temp:  [97.5 F (36.4 C)-98.7 F (37.1 C)] 98.7 F (37.1 C) (12/30 1359) Pulse Rate:  [69-86] 78 (12/30 1359) Resp:  [16-20] 20 (12/30 1359) BP: (125-167)/(63-75) 125/67 mmHg (12/30 1359) SpO2:  [90 %-96 %] 90 % (12/30 1359) Weight:  [205 lb (92.987 kg)] 205 lb (92.987 kg) (12/29 1422)  Intake/Output from previous day: 12/29 0701 - 12/30 0700 In: 3185 [P.O.:100; I.V.:2985; IV Piggyback:100] Out: 2675 [Urine:2250; Drains:350; Blood:75] Intake/Output this shift:     Recent Labs  08/26/13 0559  HGB 10.4*    Recent Labs  08/26/13 0559  WBC 11.7*  RBC 3.19*  HCT 30.5*  PLT 258    Recent Labs  08/26/13 0559  NA 137  K 4.2  CL 102  CO2 25  BUN 20  CREATININE 0.77  GLUCOSE 139*  CALCIUM 8.5   No results found for this basename: LABPT, INR,  in the last 72 hours  Neurologically intact Sensation intact distally Intact pulses distally Dorsiflexion/Plantar flexion intact No cellulitis present Compartment soft  Assessment/Plan: 1 Day Post-Op Procedure(s) (LRB): TOTAL KNEE ARTHROPLASTY (Right) Advance diet Up with therapy Plan for discharge tomorrow Discharge home with home health  Fuller Canada 08/26/2013, 2:16 PM

## 2013-08-26 NOTE — Progress Notes (Signed)
Utilization Review Complete  

## 2013-08-26 NOTE — Progress Notes (Signed)
Physical Therapy Treatment Patient Details Name: Kimberly [name] MRN: [id number] DOB: 01/04/1951 Today's Date: 08/26/2013 Time: 1308-6578 PT Time Calculation (min): 45 min  PT Assessment / Plan / Recommendation     PT Comments   Pt needs little assistance with basic transfers and gait.  Pt able to increase ambulation distance this morning to 150 feet using RW and supervision.  Pt only c/o pain with ambulation of 5/10 but without noted gait deviations.  Pt perfomed all ROM and strengthening exercises.  Educated on CPM that will be in place this afternoon.  Pt left seated in chair without distress.     Progress towards PT Goals Progress towards PT goals: Progressing toward goals  Plan Current plan remains appropriate    Precautions / Restrictions Precautions Precautions: Fall    Mobility  Bed Mobility Bed Mobility: Sit to Supine Supine to Sit: 5: Supervision Sit to Supine: 5: Supervision Transfers Transfers: Sit to Stand;Stand to Sit Sit to Stand: 6: Modified independent (Device/Increase time) Stand to Sit: 6: Modified independent (Device/Increase time) Ambulation/Gait Ambulation/Gait Assistance: 5: Supervision Ambulation Distance (Feet): 150 Feet Assistive device: Rolling walker Gait Pattern: Within Functional Limits Gait velocity: WNL    Exercises Total Joint Exercises Ankle Circles/Pumps: AROM;Both;10 reps;Supine Quad Sets: AROM;Both;10 reps;Supine Heel Slides: AAROM;Right;10 reps Straight Leg Raises: Right;5 reps;AAROM Goniometric ROM: not taken this am; will take in pm    PT Goals (current goals can now be found in the care plan section)       Subjective Data   Pt states currently without pain, however c/o pain of 5/10 with ambulation.   Cognition  Cognition Arousal/Alertness: Awake/alert Behavior During Therapy: WFL for tasks assessed/performed Overall Cognitive Status: Within Functional Limits for tasks assessed    Balance     End of Session PT - End  of Session Equipment Utilized During Treatment: Gait belt Activity Tolerance: Patient tolerated treatment well Patient left: in chair CPM Right Knee CPM Right Knee: Off Additional Comments: RN alerted to remove CPM after 6-8 hours    Lurena Nida, PTA/CLT 08/26/2013, 8:42 PM

## 2013-08-27 ENCOUNTER — Encounter (HOSPITAL_COMMUNITY): Payer: Self-pay | Admitting: Orthopedic Surgery

## 2013-08-27 LAB — CBC
Hemoglobin: 9.3 g/dL — ABNORMAL LOW (ref 12.0–15.0)
MCH: 32.5 pg (ref 26.0–34.0)
Platelets: 230 10*3/uL (ref 150–400)
RBC: 2.86 MIL/uL — ABNORMAL LOW (ref 3.87–5.11)
WBC: 9.1 10*3/uL (ref 4.0–10.5)

## 2013-08-27 LAB — GLUCOSE, CAPILLARY: Glucose-Capillary: 120 mg/dL — ABNORMAL HIGH (ref 70–99)

## 2013-08-27 MED ORDER — ASPIRIN 325 MG PO TBEC: 325.0000 mg | DELAYED_RELEASE_TABLET | Freq: Two times a day (BID) | ORAL | Status: DC

## 2013-08-27 MED ORDER — OXYCODONE-ACETAMINOPHEN 5-325 MG PO TABS: 1.0000 | ORAL_TABLET | ORAL | Status: DC

## 2013-08-27 MED ORDER — DSS 100 MG PO CAPS: 100.0000 mg | ORAL_CAPSULE | Freq: Two times a day (BID) | ORAL | Status: DC

## 2013-08-27 MED ORDER — METHOCARBAMOL 500 MG PO TABS: 500.0000 mg | ORAL_TABLET | Freq: Four times a day (QID) | ORAL | Status: DC | PRN

## 2013-08-27 NOTE — Progress Notes (Signed)
D/c instructions reviewed with patient.  Verbalized understanding. Pt dc'd to home with husband. Schonewitz, Candelaria Stagers 08/27/2013

## 2013-08-27 NOTE — Discharge Summary (Signed)
Physician Discharge Summary  Patient ID: Kimberly [name] MRN: [id number] DOB/AGE: 04-10-1951 62 y.o.  Admit date: 08/25/2013 Discharge date: 08/27/2013  Admission Diagnoses: OA RIGHT KNEE  Discharge Diagnoses: OA RIGHT KNEE Active Problems:   OA (osteoarthritis) of knee   Discharged Condition: good  Hospital Course: UNREMARKABLE COURSE  Right knee ROM=8-76 degrees, AA.  She is able to hold right knee in full extension isometrically.  She  Ambulation/Gait Ambulation/Gait Assistance: 5: Supervision Ambulation Distance (Feet): 150 Feet Assistive device: Rolling walker  Hemoglobin & Hematocrit     Component Value Date/Time   HGB 9.3* 08/27/2013 0546   HCT 28.0* 08/27/2013 0546    Treatments: surgery: RIGHT TKA DEPUY SIGMA PS FB   Discharge Exam: Blood pressure 140/55, pulse 78, temperature 98.6 F (37 C), temperature source Oral, resp. rate 20, height 5\' 3"  (1.6 m), weight 205 lb (92.987 kg), SpO2 92.00%. Incision/Wound: CLEAN   Disposition: 01-Home or Self Care  Discharge Orders   Future Appointments Provider Department Dept Phone   09/08/2013 10:00 AM Vickki Hearing, MD [facility] [phone]   Future Orders Complete By Expires   Call MD / Call 911  As directed    Comments:     If you experience chest pain or shortness of breath, CALL 911 and be transported to the hospital emergency room.  If you develope a fever above 101 F, pus (white drainage) or increased drainage or redness at the wound, or calf pain, call your surgeon's office.   Change dressing  As directed    Comments:     Change dressing on thursday, then change the dressing daily with sterile 4 x 4 inch gauze dressing and apply TED hose.  You may clean the incision with alcohol prior to redressing.   Constipation Prevention  As directed    Comments:     Drink plenty of fluids.  Prune juice may be helpful.  You may use a stool softener, such as Colace (over the counter)  100 mg twice a day.  Use MiraLax (over the counter) for constipation as needed.   CPM  As directed    Comments:     Continuous passive motion machine (CPM):      Use the CPM from 0 to 80 for 6-8 hours per day.      You may increase by 10 per day.  You may break it up into 2 or 3 sessions per day.      Use CPM for 3 weeks or until you are told to stop.   Diet - low sodium heart healthy  As directed    Do not put a pillow under the knee. Place it under the heel.  As directed    Driving restrictions  As directed    Comments:     No driving for 4 weeks   For home use only DME Continuous passive motion machine  As directed    Comments:     Continuous passive motion machine (CPM):      Use the CPM from 0 to 80 for 6-8 hours per day.      You may increase by 10 per day.  You may break it up into 2 or 3 sessions per day.      Use CPM for 3 weeks or until you are told to stop.   Increase activity slowly as tolerated  As directed    TED hose  As directed    Comments:  Use stockings (TED hose) for 4 weeks on both leg(s).  You may remove them at night for sleeping.       Medication List    STOP taking these medications       aspirin 81 MG tablet  Replaced by:  aspirin 325 MG EC tablet     diclofenac 75 MG EC tablet  Commonly known as:  VOLTAREN      TAKE these medications       aspirin 325 MG EC tablet  Take 1 tablet (325 mg total) by mouth 2 (two) times daily.     DSS 100 MG Caps  Take 100 mg by mouth 2 (two) times daily.     levothyroxine 137 MCG tablet  Commonly known as:  SYNTHROID, LEVOTHROID  Take 1 tablet (137 mcg total) by mouth daily before breakfast.     losartan 100 MG tablet  Commonly known as:  COZAAR  Take 1 tablet (100 mg total) by mouth daily.     metFORMIN 500 MG tablet  Commonly known as:  GLUCOPHAGE  Take 1 tablet (500 mg total) by mouth 2 (two) times daily with a meal.     methocarbamol 500 MG tablet  Commonly known as:  ROBAXIN  Take 1 tablet (500  mg total) by mouth every 6 (six) hours as needed for muscle spasms.     OVER THE COUNTER MEDICATION  Take 1 tablet by mouth daily. OTC estroven     oxyCODONE-acetaminophen 5-325 MG per tablet  Commonly known as:  PERCOCET/ROXICET  Take 1 tablet by mouth every 4 (four) hours.     venlafaxine XR 150 MG 24 hr capsule  Commonly known as:  EFFEXOR-XR  TAKE 1 CAPSULE BY MOUTH DAILY           Follow-up Information   Follow up with Fuller Canada, MD.   Specialties:  Orthopedic Surgery, Radiology   Contact information:   [address] [place] [address] [phone]       Signed: Fuller Canada 08/27/2013, 8:24 AM

## 2013-08-27 NOTE — Progress Notes (Signed)
Physical Therapy Treatment Patient Details Name: Kimberly [name] MRN: [id number] DOB: Jan 21, 1951 Today's Date: 08/27/2013 Time: 3244-0102 PT Time Calculation (min): 49 min  PT Assessment / Plan / Recommendation  History of Present Illness  Pt reports feeling well and is anxious to go home.   PT Comments   Pt is progressing rapidly.  She has 8-82 degrees of right knee ROM, AA.  She is now independent with transfers and her gait is stable with a walker on level and steps.  She is ready to transition to home with HHPT.  Follow Up Recommendations        Does the patient have the potential to tolerate intense rehabilitation     Barriers to Discharge        Equipment Recommendations       Recommendations for Other Services    Frequency     Progress towards PT Goals Progress towards PT goals: Goals met/education completed, patient discharged from PT  Plan Current plan remains appropriate    Precautions / Restrictions Restrictions Weight Bearing Restrictions: No   Pertinent Vitals/Pain     Mobility  Bed Mobility Supine to Sit: 6: Modified independent (Device/Increase time) (instructed in using the LLE to help the RLE) Sit to Supine: 6: Modified independent (Device/Increase time) Transfers Sit to Stand: 6: Modified independent (Device/Increase time) Stand to Sit: 6: Modified independent (Device/Increase time) Stand Pivot Transfers: 6: Modified independent (Device/Increase time) Ambulation/Gait Ambulation/Gait Assistance: 6: Modified independent (Device/Increase time) Ambulation Distance (Feet): 250 Feet Gait Pattern: Within Functional Limits Stairs: Yes Stairs Assistance: 4: Min assist Stair Management Technique: Forwards;With walker;One rail Left Number of Stairs: 5    Exercises Total Joint Exercises Ankle Circles/Pumps: AROM;Both;10 reps;Supine Quad Sets: AROM;Both;10 reps;Supine Short Arc Quad: AAROM;Right;10 reps;Supine Heel Slides: AAROM;Right;10  reps;Supine Goniometric ROM: 8-82 degrees right knee, AA   PT Diagnosis:    PT Problem List:   PT Treatment Interventions:     PT Goals (current goals can now be found in the care plan section)    Visit Information  Last PT Received On: 08/27/13    Subjective Data      Cognition       Balance  Balance Balance Assessed: No  End of Session PT - End of Session Equipment Utilized During Treatment: Gait belt Activity Tolerance: Patient tolerated treatment well Patient left: in chair;with call bell/phone within reach CPM Right Knee CPM Right Knee: Off   GP     Myrlene Broker L 08/27/2013, 11:25 AM

## 2013-08-28 LAB — TYPE AND SCREEN
ABO/RH(D): O POS
Antibody Screen: NEGATIVE
Unit division: 0
Unit division: 0

## 2013-09-01 ENCOUNTER — Ambulatory Visit (INDEPENDENT_AMBULATORY_CARE_PROVIDER_SITE_OTHER): Payer: 59 | Admitting: Orthopedic Surgery

## 2013-09-01 ENCOUNTER — Encounter: Payer: Self-pay | Admitting: Orthopedic Surgery

## 2013-09-01 VITALS — BP 156/93 | Ht 63.0 in | Wt 205.0 lb

## 2013-09-01 DIAGNOSIS — K5900 Constipation, unspecified: Secondary | ICD-10-CM

## 2013-09-01 DIAGNOSIS — Z96651 Presence of right artificial knee joint: Secondary | ICD-10-CM

## 2013-09-01 DIAGNOSIS — Z96659 Presence of unspecified artificial knee joint: Secondary | ICD-10-CM

## 2013-09-01 MED ORDER — HYDROCODONE-ACETAMINOPHEN 10-325 MG PO TABS: 1.0000 | ORAL_TABLET | Freq: Four times a day (QID) | ORAL | Status: DC | PRN

## 2013-09-01 MED ORDER — POLYETHYLENE GLYCOL 3350 17 GM/SCOOP PO POWD
1.0000 | Freq: Once | ORAL | Status: DC
Start: 2013-09-01 — End: 2013-11-26

## 2013-09-01 NOTE — Patient Instructions (Signed)
Take diclofenac and stop ibuprofen Take Hydrocodone as needed

## 2013-09-02 ENCOUNTER — Encounter: Payer: Self-pay | Admitting: Orthopedic Surgery

## 2013-09-02 DIAGNOSIS — Z96659 Presence of unspecified artificial knee joint: Secondary | ICD-10-CM | POA: Insufficient documentation

## 2013-09-02 DIAGNOSIS — K5900 Constipation, unspecified: Secondary | ICD-10-CM | POA: Insufficient documentation

## 2013-09-02 NOTE — Progress Notes (Signed)
Patient ID: Kimberly [name], female   DOB: [birth year], 63 y.o.   MRN: [id number] Chief Complaint  Patient presents with  . Follow-up    Post op recheck on right knee TKA, DOS 08-25-13. Check incision.    Right total knee arthroplasty patient sent in by nurse because of hereditary in her ankle. Her incision looks pristine there is a red area near her ankle some ecchymosis in her leg there is no calf tenderness negative Homans sign. She simply had some blood rate down her leg. She is on ibuprofen, diclofenac and aspirin. We have told her to only take aspirin Ecotrin one twice a day and her diclofenac. We changed her pain medication.

## 2013-09-08 ENCOUNTER — Ambulatory Visit (INDEPENDENT_AMBULATORY_CARE_PROVIDER_SITE_OTHER): Payer: 59 | Admitting: Orthopedic Surgery

## 2013-09-08 ENCOUNTER — Encounter: Payer: Self-pay | Admitting: Orthopedic Surgery

## 2013-09-08 VITALS — BP 164/88 | Ht 62.5 in | Wt 205.0 lb

## 2013-09-08 DIAGNOSIS — Z96659 Presence of unspecified artificial knee joint: Secondary | ICD-10-CM

## 2013-09-08 DIAGNOSIS — Z96651 Presence of right artificial knee joint: Secondary | ICD-10-CM

## 2013-09-08 NOTE — Patient Instructions (Signed)
Start outpatient therapy  

## 2013-09-08 NOTE — Progress Notes (Signed)
Postop visit #2 status post right total knee previous concerns about redness around the ankle have resolved. Her wound looks good her knee flexion past 90 she has full extension.  Plan start accelerated rehabilitation next week 3 times a week for 6 weeks no squats or 3 months. Steri-Strips and covered arm and the incision. Return on a Tuesday or Thursday 5-6 weeks for postop visit #3

## 2013-09-17 ENCOUNTER — Ambulatory Visit (HOSPITAL_COMMUNITY)
Admission: RE | Admit: 2013-09-17 | Discharge: 2013-09-17 | Disposition: A | Discharge status: Home / Self Care | Payer: 59 | Source: Ambulatory Visit | Attending: Orthopedic Surgery | Admitting: Orthopedic Surgery

## 2013-09-17 DIAGNOSIS — M25569 Pain in unspecified knee: Secondary | ICD-10-CM | POA: Insufficient documentation

## 2013-09-17 DIAGNOSIS — M25669 Stiffness of unspecified knee, not elsewhere classified: Secondary | ICD-10-CM | POA: Insufficient documentation

## 2013-09-17 DIAGNOSIS — IMO0001 Reserved for inherently not codable concepts without codable children: Secondary | ICD-10-CM | POA: Insufficient documentation

## 2013-09-17 DIAGNOSIS — E119 Type 2 diabetes mellitus without complications: Secondary | ICD-10-CM | POA: Insufficient documentation

## 2013-09-17 DIAGNOSIS — I10 Essential (primary) hypertension: Secondary | ICD-10-CM | POA: Insufficient documentation

## 2013-09-17 DIAGNOSIS — M25661 Stiffness of right knee, not elsewhere classified: Secondary | ICD-10-CM | POA: Insufficient documentation

## 2013-09-17 NOTE — Evaluation (Signed)
Physical Therapy Evaluation  Patient Details  Name: Kimberly [name] MRN: [id number] Date of Birth: July 26, 1951  Today's Date: 09/17/2013 Time: 1300-1345 PT Time Calculation (min): 45 min              Visit#: 1 of 18  Re-eval: 10/17/13 Assessment Diagnosis: right TKR  Surgical Date: 08/25/13 Prior Therapy: home health   Authorization: UMR     Authorization Time Period:    Authorization Visit#: 1 of 18   Past Medical History:  Past Medical History  Diagnosis Date  . Diabetes mellitus   . High blood pressure   . Hypothyroid   . Arthritis   . Obesity   . Depression   . PONV (postoperative nausea and vomiting)    Past Surgical History:  Past Surgical History  Procedure Laterality Date  . Carpal tunnel release Right   . Thyroid surgery      removal  . Abdominal hysterectomy    . Bunions Right   . Knee arthroscopy Right   . Total knee arthroplasty Right 08/25/2013    Procedure: TOTAL KNEE ARTHROPLASTY;  Surgeon: Carole Civil, MD;  Location: [facility];  Service: Orthopedics;  Laterality: Right;    Subjective Symptoms/Limitations Symptoms: c/o right knee pain,stiffness,  c/o use of cane , currently off work  Pertinent History: 63 year old. 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 : Yes Is the patient reluctant to leave their home because of a fear of falling? 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, 3 x a week , standing ) Comments: off work [employer] 11/17/13 for family leave   Cognition/Observation Observation/Other Assessments Observations: scar intat, healing  Other Assessments: post op swelling no edema   Sensation/Coordination/Flexibility/Functional Tests Functional Tests Functional Tests: FOTO 45   Assessment RLE AROM (degrees) Right Knee Extension: 5 Right Knee Flexion: 110 RLE PROM (degrees) Right Knee Extension: 1 Right Knee Flexion: 115 RLE Strength Right Knee Flexion: 4/5 Right Knee Extension: 4/5 LLE AROM (degrees) Left Knee Extension: 0 Left Knee Flexion: 140  Exercise/Treatments Mobility/Balance  Ambulation/Gait Ambulation/Gait: Yes Ambulation/Gait Assistance: 7: Independent Assistive device: Straight cane Gait Pattern: Decreased stance time - right;Decreased step length - left;Decreased weight shift to right Stair Management Technique: Step to pattern     Stationary Bike: 3 min ROM         Supine Quad Sets: Strengthening;Right;10 reps        Physical Therapy Assessment and Plan PT Assessment and Plan Clinical Impression Statement: 63 year old pleasant motivated female presetns for PT s/p right TKR 12/29. she presents with post op right knee stiffness, limited AROM flexion and extension , and functional limits with walking and standing tolerance.  Pt will benefit from skilled  therapeutic intervention in order to improve on the following deficits: Abnormal gait;Decreased activity tolerance;Decreased range of motion;Decreased strength Rehab Potential: Good Clinical Impairments Affecting Rehab Potential: left knee DJD  PT Frequency: Min 3X/week PT Duration: 6 weeks PT  Treatment/Interventions: Gait training;Stair training;Functional mobility training;Therapeutic activities;Therapeutic exercise;Manual techniques;Patient/family education;Neuromuscular re-education;Balance training;Modalities PT Plan: NO SQUATS x 3 months , manual techniques for knee jt mobs and glides, HEP progression, bike as tolerated, standing ther ex, LE stretches, gait/balance activities , walking program , s/l hip abduction next [place] Exercise Program Pt/caregiver will Perform Home Exercise Program: Independently PT Goal: Perform Home Exercise Program - Progress: Goal set today PT Short Term Goals Time to Complete Short Term Goals: 2 weeks PT Short Term Goal 1: Improve  ROM right knee 0-130 for gait, stairs ,transfers  PT Short Term Goal 2: Imrove right quadriceps MMT to 4+/5 or greater for return to work, stairs, transfers in /out of car  PT Short Term Goal 3: manage all house hold activities safely with max 3/10 pain  PT Long Term Goals Time to Complete Long Term Goals: Other (comment) (6 weeks ) PT Long Term Goal 1: ambulate no device without gait deviations  level ground 15 -20 min  PT Long Term Goal 2: ascend and descend curb/step no handrail safely and independently  Long Term Goal 3: stand 20-30 min without increased pain for return to work   Problem List Patient Active Problem List   Diagnosis Date Noted  . Stiffness of right knee 09/17/2013  . S/P knee replacement 09/02/2013  . Unspecified constipation 09/02/2013  . Bilateral knee pain 08/07/2013  . Microproteinuria 07/16/2013  . Type 2 diabetes mellitus 07/06/2013  . Essential hypertension, benign 07/06/2013  . Hypothyroidism 07/06/2013  . Medial meniscus, posterior horn derangement 01/19/2011  . OA (osteoarthritis) of knee 01/19/2011  . Mononeuritis leg 01/19/2011    PT Plan of Care PT Patient Instructions: ambulation with cane outside, continue home exercises from home care, added quad sets    Consulted and Agree with Plan of Care: Patient  GP    Sarajane Marek 09/17/2013, 4:25 PM  Physician Documentation Your signature is required to indicate approval of the treatment plan as stated above.  Please sign and either send electronically or make a copy of this report for your files and return this physician signed original.   Please mark one 1.__approve of plan  2. ___approve of plan with the following conditions.   ______________________________                                                          _____________________ Physician Signature                                                                                                             Date

## 2013-09-17 NOTE — Evaluation (Deleted)
Physical Therapy Evaluation  Patient Details  Name: Kimberly [name] MRN: [id number] Date of Birth: [birth year]  Today's Date: 09/17/2013 Time: 1300-1345 PT Time Calculation (min): 45 min PT Charges - 1 evaluation             Visit#: 1 of 18  Re-eval: 10/17/13 Assessment Diagnosis: right TKR  Surgical Date: 08/25/13 Prior Therapy: home health   Authorization: UMR     Authorization Time Period:    Authorization Visit#: 1 of 18   Past Medical History:  Past Medical History  Diagnosis Date  . Diabetes mellitus   . High blood pressure   . Hypothyroid   . Arthritis   . Obesity   . Depression   . PONV (postoperative nausea and vomiting)    Past Surgical History:  Past Surgical History  Procedure Laterality Date  . Carpal tunnel release Right   . Thyroid surgery      removal  . Abdominal hysterectomy    . Bunions Right   . Knee arthroscopy Right   . Total knee arthroplasty Right 08/25/2013    Procedure: TOTAL KNEE ARTHROPLASTY;  Surgeon: Carole Civil, MD;  Location: [facility];  Service: Orthopedics;  Laterality: Right;    Subjective Symptoms/Limitations Symptoms: c/o right knee pain,stiffness,  c/o use of cane , currently off work  Pertinent History: 63 year old. 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 : Yes Is the patient reluctant to leave their home because of a fear of falling? 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, 3 x a week , standing ) Comments: off work [employer] 11/17/13 for family leave   Cognition/Observation Observation/Other Assessments Observations: scar intact, healing  Other Assessments: post op swelling R knee , decreased patellar mobility R knee inferior and superior   Sensation/Coordination/Flexibility/Functional Tests Functional Tests Functional Tests: FOTO 45   Assessment RLE AROM (degrees) Right Knee Extension: 5 Right Knee Flexion: 110 RLE PROM (degrees) Right Knee Extension: 1 Right Knee Flexion: 115 RLE Strength Right Knee Flexion: 4/5 Right Knee Extension: 4/5 LLE AROM (degrees) Left Knee Extension: 0 Left Knee Flexion: 140  Exercise/Treatments Mobility/Balance  Ambulation/Gait Ambulation/Gait: Yes Ambulation/Gait Assistance: 7: Independent Assistive device: Straight cane Gait Pattern: Decreased stance time - right;Decreased step length - left;Decreased weight shift to right Stair Management Technique: Step to pattern     Aerobic Stationary Bike: 3 min ROM   Right knee PA tibia glides on femur for extension - 2 min   No charge    Quad Sets: Strengthening;Right;10 reps   Physical Therapy Assessment and Plan PT Assessment and Plan Clinical Impression Statement: 63 year old pleasant motivated female presetns for PT s/p right TKR 12/29. she presents with post op right knee stiffness, limited AROM  flexion and extension , and functional limits with walking and standing tolerance.  Pt will benefit from skilled therapeutic intervention in order to improve on the following deficits: Abnormal gait;Decreased activity tolerance;Decreased range of motion;Decreased strength Rehab Potential: Good Clinical Impairments Affecting Rehab  Potential: left knee DJD  PT Frequency: Min 3X/week PT Duration: 6 weeks PT Treatment/Interventions: Gait training;Stair training;Functional mobility training;Therapeutic activities;Therapeutic exercise;Manual techniques;Patient/family education;Neuromuscular re-education;Balance training;Modalities PT Plan: NO SQUATS x 3 months , manual techniques for knee jt mobs and glides, HEP progression, bike as tolerated, standing ther ex, LE stretches, gait/balance activities , walking program , s/l hip abduction next [place] Exercise Program Pt/caregiver will Perform Home Exercise Program: Independently PT Goal: Perform Home Exercise Program - Progress: Goal set today PT Short Term Goals Time to Complete Short Term Goals: 2 weeks PT Short Term Goal 1: Improve  ROM right knee 0-130 for gait, stairs ,transfers  PT Short Term Goal 2: Imrove right quadriceps MMT to 4+/5 or greater for return to work, stairs, transfers in /out of car  PT Short Term Goal 3: manage all house hold activities safely with max 3/10 pain  PT Long Term Goals Time to Complete Long Term Goals: Other (comment) (6 weeks ) PT Long Term Goal 1: ambulate no device without gait deviations  level ground 15 -20 min  PT Long Term Goal 2: ascend and descend curb/step no handrail safely and independently  Long Term Goal 3: stand 20-30 min without increased pain for return to work   Problem List Patient Active Problem List   Diagnosis Date Noted  . Stiffness of right knee 09/17/2013  . S/P knee replacement 09/02/2013  . Unspecified constipation 09/02/2013  . Bilateral knee pain 08/07/2013  . Microproteinuria 07/16/2013  . Type 2 diabetes mellitus 07/06/2013  . Essential hypertension, benign 07/06/2013  . Hypothyroidism 07/06/2013  . Medial meniscus, posterior horn derangement 01/19/2011  . OA (osteoarthritis) of knee 01/19/2011  . Mononeuritis leg 01/19/2011    PT Plan of Care PT Patient Instructions: ambulation with  cane outside, continue home exercises from home care, added quad sets   Consulted and Agree with Plan of Care: Patient  GP    Bear Osten 09/17/2013, 4:14 PM  Physician Documentation Your signature is required to indicate approval of the treatment plan as stated above.  Please sign and either send electronically or make a copy of this report for your files and return this physician signed original.   Please mark one 1.__approve of plan  2. ___approve of plan with the following conditions.   ______________________________                                                          _____________________ Physician Signature  Date  

## 2013-09-22 ENCOUNTER — Inpatient Hospital Stay (HOSPITAL_COMMUNITY)
Admission: RE | Admit: 2013-09-22 | Discharge: 2013-09-22 | Disposition: A | Discharge status: Home / Self Care | Payer: 59 | Source: Ambulatory Visit | Attending: *Deleted | Admitting: *Deleted

## 2013-09-22 ENCOUNTER — Ambulatory Visit (HOSPITAL_COMMUNITY)
Admission: RE | Admit: 2013-09-22 | Discharge: 2013-09-22 | Disposition: A | Discharge status: Home / Self Care | Payer: 59 | Source: Ambulatory Visit | Attending: Family Medicine | Admitting: Family Medicine

## 2013-09-22 NOTE — Progress Notes (Signed)
Physical Therapy Treatment Patient Details  Name: Kimberly [name] MRN: [id number] Date of Birth: 1951-08-06  Today's Date: 09/22/2013 Time: 1040-1110 PT Time Calculation (min): 30 min Charges: Therex x 20'(1040-1100) Ice x 10'(1100-1110)  Visit#: 2 of 18  Re-eval: 10/17/13  Authorization: UMR   Authorization Visit#: 2 of 18   Subjective: Symptoms/Limitations Symptoms: Pt states that she is doing well and she is no longer icing her knee. She is having trouble getting comfortable to go to sleep. Pain Assessment Currently in Pain?: No/denies  Exercise/Treatments Standing Heel Raises: 10 reps;Limitations Heel Raises Limitations: Toe raises x 10 Knee Flexion: 10 reps Lateral Step Up: 10 reps;Step Height: 4";Right Rocker Board: 2 minutes Supine Quad Sets: 10 reps;Right   Modalities Modalities: Cryotherapy Cryotherapy Number Minutes Cryotherapy: 10 Minutes Cryotherapy Location: Knee (Right) Type of Cryotherapy: Ice pack  Physical Therapy Assessment and Plan PT Assessment and Plan Clinical Impression Statement: Tx limited by time as pt thought that her appointment was at 10:45. Began standing therex with minimal difficulty after initial cueing and demo. Pt able to achieve 112 degrees of active flexion on right knee. Encouraged pt to begin icing 3x per day to limit inflammation and pain. Pt will benefit from skilled therapeutic intervention in order to improve on the following deficits: Abnormal gait;Decreased activity tolerance;Decreased range of motion;Decreased strength Rehab Potential: Good Clinical Impairments Affecting Rehab Potential: left knee DJD  PT Frequency: Min 3X/week PT Duration: 6 weeks PT Treatment/Interventions: Gait training;Stair training;Functional mobility training;Therapeutic activities;Therapeutic exercise;Manual techniques;Patient/family education;Neuromuscular re-education;Balance training;Modalities PT Plan: NO SQUATS x 3 months , manual techniques  for knee jt mobs and glides, HEP progression, bike as tolerated, standing ther ex, LE stretches, gait/balance activities , walking program , s/l hip abduction next vist .     Problem List Patient Active Problem List   Diagnosis Date Noted  . Stiffness of right knee 09/17/2013  . S/P knee replacement 09/02/2013  . Unspecified constipation 09/02/2013  . Bilateral knee pain 08/07/2013  . Microproteinuria 07/16/2013  . Type 2 diabetes mellitus 07/06/2013  . Essential hypertension, benign 07/06/2013  . Hypothyroidism 07/06/2013  . Medial meniscus, posterior horn derangement 01/19/2011  . OA (osteoarthritis) of knee 01/19/2011  . Mononeuritis leg 01/19/2011    PT - End of Session Activity Tolerance: Patient tolerated treatment well General Behavior During Therapy: [facility] for tasks assessed/performed  Rachelle Hora, PTA 09/22/2013, 12:32 PM

## 2013-09-24 ENCOUNTER — Ambulatory Visit (HOSPITAL_COMMUNITY)
Admission: RE | Admit: 2013-09-24 | Discharge: 2013-09-24 | Disposition: A | Discharge status: Home / Self Care | Payer: 59 | Source: Ambulatory Visit | Attending: Family Medicine | Admitting: Family Medicine

## 2013-09-24 DIAGNOSIS — M25661 Stiffness of right knee, not elsewhere classified: Secondary | ICD-10-CM

## 2013-09-24 NOTE — Progress Notes (Signed)
Physical Therapy Treatment Patient Details  Name: Kimberly [name] MRN: [id number] Date of Birth:   Today's Date: 09/24/2013 Time: 1950-9326 PT Time Calculation (min): 60 min TE 7124- 5809  XIP 3825 -1445  Visit#: 3 of 18  Re-eval: 10/17/13 Assessment Diagnosis: right TKR  Surgical Date: 08/25/13 Next MD Visit: Dr. Aline Brochure  Prior Therapy: home health   Authorization: [employer]   Authorization Time Period:    Authorization Visit#: 3 of 18   Subjective: Symptoms/Limitations Symptoms: no complaints, pain at night, otherwise doing well  Pain Assessment Currently in Pain?: No/denies  Precautions/Restrictions ; no squats x 3 months post surgery     Exercise/Treatments Mobility/Balance        Stretches Gastroc Stretch: 3 reps;30 seconds;Limitations Gastroc Stretch Limitations: slant board  Aerobic Stationary Bike: nu step  8 min L3       Standing Heel Raises: 20 reps (balance pad ) Knee Flexion: 15 reps Lateral Step Up: Right;15 reps;Step Height: 4" Forward Step Up: Right;15 reps;Hand Hold: 1;Step Height: 4";Step Height: 6" Stairs: 3 flights reciprocal  up and down, one ha  Seated Heel Slides: 20 reps Supine Heel Slides: Right;20 reps Straight Leg Raises: Strengthening;Right;10 reps Other Supine Knee Exercises: R knee PROM flexion 15 x  Sidelying   Prone      Modalities Modalities: Cryotherapy Cryotherapy Number Minutes Cryotherapy: 10 Minutes Cryotherapy Location: Knee  Physical Therapy Assessment and Plan PT Assessment and Plan Clinical Impression Statement: r knee AROM improved to 118, progressed to stairs reciprocal  Pt will benefit from skilled therapeutic intervention in order to improve on the following deficits: Abnormal gait;Decreased activity tolerance;Decreased range of motion;Decreased strength Rehab Potential: Good Clinical Impairments Affecting Rehab Potential: left knee DJD  PT Frequency: Min 3X/week PT Duration: 6 weeks PT  Treatment/Interventions: Gait training;Stair training;Functional mobility training;Therapeutic activities;Therapeutic exercise;Manual techniques;Patient/family education;Neuromuscular re-education;Balance training;Modalities PT Plan: NO SQUATS x 3 months , manual techniques for knee jt mobs and glides, HEP progression, bike as tolerated, standing ther ex, LE stretches, gait/balance activities , walking program , s/l hip abduction next vist .    Goals Home Exercise Program Pt/caregiver will Perform Home Exercise Program: Independently PT Goal: Perform Home Exercise Program - Progress: Progressing toward goal PT Short Term Goals Time to Complete Short Term Goals: 2 weeks PT Short Term Goal 1: Improve  ROM right knee 0-130 for gait, stairs ,transfers  PT Short Term Goal 1 - Progress: Progressing toward goal PT Short Term Goal 2: Imrove right quadriceps MMT to 4+/5 or greater for return to work, stairs, transfers in /out of car  PT Short Term Goal 2 - Progress: Progressing toward goal PT Short Term Goal 3: manage all house hold activities safely with max 3/10 pain  PT Short Term Goal 3 - Progress: Progressing toward goal PT Long Term Goals Time to Complete Long Term Goals: Other (comment) (6 weeks ) PT Long Term Goal 1: ambulate no device without gait deviations  level ground 15 -20 min  PT Long Term Goal 1 - Progress: Progressing toward goal PT Long Term Goal 2: ascend and descend curb/step no handrail safely and independently  PT Long Term Goal 2 - Progress: Progressing toward goal Long Term Goal 3: stand 20-30 min without increased pain for return to work  Long Term Goal 3 Progress: Progressing toward goal  Problem List Patient Active Problem List   Diagnosis Date Noted  . Stiffness of right knee 09/17/2013  . S/P knee replacement 09/02/2013  . Unspecified constipation 09/02/2013  .  Bilateral knee pain 08/07/2013  . Microproteinuria 07/16/2013  . Type 2 diabetes mellitus 07/06/2013  .  Essential hypertension, benign 07/06/2013  . Hypothyroidism 07/06/2013  . Medial meniscus, posterior horn derangement 01/19/2011  . OA (osteoarthritis) of knee 01/19/2011  . Mononeuritis leg 01/19/2011    PT - End of Session Activity Tolerance: Patient tolerated treatment well General Behavior During Therapy: [facility] for tasks assessed/performed  GP    Kimberly [name] 09/24/2013, 3:32 PM

## 2013-09-26 ENCOUNTER — Ambulatory Visit (HOSPITAL_COMMUNITY)
Admission: RE | Admit: 2013-09-26 | Discharge: 2013-09-26 | Disposition: A | Discharge status: Home / Self Care | Payer: 59 | Source: Ambulatory Visit | Attending: Orthopedic Surgery | Admitting: Orthopedic Surgery

## 2013-09-26 DIAGNOSIS — M25661 Stiffness of right knee, not elsewhere classified: Secondary | ICD-10-CM

## 2013-09-26 NOTE — Progress Notes (Signed)
Physical Therapy Treatment Patient Details  Name: Kimberly [name] MRN: [id number] Date of Birth: 20-May-1951  Today's Date: 09/26/2013 Time: 1345-1430 PT Time Calculation (min): 45 min 1345- 1415 TE  `1415 - 1430 manual  Visit#: 4 of 18  Re-eval: 10/17/13 Assessment Diagnosis: right TKR  Surgical Date: 08/25/13 Next MD Visit: Dr. Aline Brochure  Prior Therapy: home health   Authorization: [employer]   Authorization Time Period:    Authorization Visit#: 4 of 18   Subjective: Symptoms/Limitations Symptoms: no complaints, pain at night, otherwise doing well  Pertinent History: 63 year old. right TKR replacement 08/25/2013, completed home care , left knee DJD  Pain Assessment Currently in Pain?: No/denies  Precautions/Restrictions ; no squats     Exercise/Treatments Mobility/Balance        Stretches Passive Hamstring Stretch: 2 reps;30 seconds R  Gastroc Stretch: 3 reps;30 seconds;Limitations B  Gastroc Stretch Limitations: slant board  Aerobic Stationary Bike: nu step  8 min L3  Elliptical: 1 min  Machines for Strengthening     Standing Tandem walking 2 reps no UE support  Heel Raises: 20 reps (airex balance pad ) Knee Flexion: 15 reps Forward Lunges:  (airex balance pad ) Hip ADduction: Both;15 reps (aires balance pad ) Forward Step Up: Right;15 reps;Hand Hold: 1;Step Height: 4";Step Height: 6" Stairs: 1 flight reciprocal  Seated Heel Slides: 20 reps Supine Quad Sets: 10 reps;Right         Manual Therapy Manual Therapy: Joint mobilization Joint Mobilization: right knee patellar mobs, gentle distraction, scar massage, retro massage x 15 min   Physical Therapy Assessment and Plan PT Assessment and Plan Clinical Impression Statement: patient improving tolerance to standing and walking, progiressintg well towards goals, needs further balane training for safety with return to work  Pt will benefit from skilled therapeutic intervention in order to improve on the  following deficits: Abnormal gait;Decreased activity tolerance;Decreased range of motion;Decreased strength Rehab Potential: Good Clinical Impairments Affecting Rehab Potential: left knee DJD  PT Frequency: Min 3X/week PT Duration: 6 weeks PT Treatment/Interventions: Gait training;Stair training;Functional mobility training;Therapeutic activities;Therapeutic exercise;Manual techniques;Patient/family education;Neuromuscular re-education;Balance training;Modalities PT Plan: NO SQUATS x 3 months , manual techniques for knee jt mobs and glides, HEP progression, bike as tolerated, standing ther ex, LE stretches, gait/balance activities , walking program , s/l hip abduction next vist . SLS and balance vectors next visit     Goals Home Exercise Program Pt/caregiver will Perform Home Exercise Program: Independently PT Short Term Goals Time to Complete Short Term Goals: 2 weeks PT Short Term Goal 1: Improve  ROM right knee 0-130 for gait, stairs ,transfers  PT Short Term Goal 1 - Progress: Progressing toward goal PT Short Term Goal 2: Imrove right quadriceps MMT to 4+/5 or greater for return to work, stairs, transfers in /out of car  PT Short Term Goal 2 - Progress: Progressing toward goal PT Short Term Goal 3: manage all house hold activities safely with max 3/10 pain  PT Short Term Goal 3 - Progress: Partly met PT Long Term Goals Time to Complete Long Term Goals: Other (comment) (6 weeks ) PT Long Term Goal 1: ambulate no device without gait deviations  level ground 15 -20 min  PT Long Term Goal 1 - Progress: Partly met PT Long Term Goal 2: ascend and descend curb/step no handrail safely and independently  PT Long Term Goal 2 - Progress: Partly met Long Term Goal 3: stand 20-30 min without increased pain for return to work  Long Term  Goal 3 Progress: Partly met  Problem List Patient Active Problem List   Diagnosis Date Noted  . Stiffness of right knee 09/17/2013  . S/P knee replacement  09/02/2013  . Unspecified constipation 09/02/2013  . Bilateral knee pain 08/07/2013  . Microproteinuria 07/16/2013  . Type 2 diabetes mellitus 07/06/2013  . Essential hypertension, benign 07/06/2013  . Hypothyroidism 07/06/2013  . Medial meniscus, posterior horn derangement 01/19/2011  . OA (osteoarthritis) of knee 01/19/2011  . Mononeuritis leg 01/19/2011    PT - End of Session Activity Tolerance: Patient tolerated treatment well General Behavior During Therapy: [facility] for tasks assessed/performed  GP    Donny Heffern 09/26/2013, 4:19 PM

## 2013-09-29 ENCOUNTER — Ambulatory Visit (HOSPITAL_COMMUNITY)
Admission: RE | Admit: 2013-09-29 | Discharge: 2013-09-29 | Disposition: A | Discharge status: Home / Self Care | Payer: 59 | Source: Ambulatory Visit | Attending: Orthopedic Surgery | Admitting: Orthopedic Surgery

## 2013-09-29 DIAGNOSIS — I10 Essential (primary) hypertension: Secondary | ICD-10-CM | POA: Insufficient documentation

## 2013-09-29 DIAGNOSIS — M25661 Stiffness of right knee, not elsewhere classified: Secondary | ICD-10-CM

## 2013-09-29 DIAGNOSIS — M25669 Stiffness of unspecified knee, not elsewhere classified: Secondary | ICD-10-CM | POA: Insufficient documentation

## 2013-09-29 DIAGNOSIS — M25569 Pain in unspecified knee: Secondary | ICD-10-CM | POA: Insufficient documentation

## 2013-09-29 DIAGNOSIS — E119 Type 2 diabetes mellitus without complications: Secondary | ICD-10-CM | POA: Insufficient documentation

## 2013-09-29 DIAGNOSIS — IMO0001 Reserved for inherently not codable concepts without codable children: Secondary | ICD-10-CM | POA: Insufficient documentation

## 2013-09-29 NOTE — Progress Notes (Signed)
Physical Therapy Treatment Patient Details  Name: Kimberly [name] MRN: [id number] Date of Birth: 1950/12/17  Today's Date: 09/29/2013 Time: 1340-1440 PT Time Calculation (min): 60 min Charge: TE Manter, Manual 1415-1428, Ice 1428-1440  Visit#: 5 of 18  Re-eval: 10/17/13 Assessment Diagnosis: right TKR  Surgical Date: 08/25/13 Next MD Visit: Dr. Aline Brochure  Prior Therapy: home health   Authorization: [employer]   Authorization Time Period:    Authorization Visit#: 5 of 10   Subjective: Symptoms/Limitations Symptoms: Pain free today, intermittent pain when seated inside knee joint  Pain Assessment Currently in Pain?: No/denies  Objective:   Exercise/Treatments Aerobic Stationary Bike: nu step  8 min L4 Standing Heel Raises: 20 reps;Limitations Heel Raises Limitations: Toe raises x 20 on Airex Lateral Step Up: Right;15 reps;Step Height: 4" Forward Step Up: Right;20 reps;Hand Hold: 1;Step Height: 6" Step Down: Right;20 reps;Hand Hold: 1;Step Height: 6" Stairs: 1 flight Buyer, retail Board: 2 minutes (R/L and stabile 70min each) SLS: R 33", Lt 16" max of 3 Supine Quad Sets: 10 reps;Right Sidelying Hip ABduction: 15 reps;Limitations Hip ABduction Limitations: facilitation for proper form    Modalities Modalities: Cryotherapy Manual Therapy Manual Therapy: Joint mobilization Joint Mobilization: Right knee patella mobs all directions, tib/fib, scar tissue massage Cryotherapy Number Minutes Cryotherapy: 10 Minutes Cryotherapy Location: Knee Type of Cryotherapy: Ice pack  Physical Therapy Assessment and Plan PT Assessment and Plan Clinical Impression Statement: Progressed to balance activities with cueing to increase spatial awareness with improve SLS following.  Began S/l hip abduction for gluteal strengthening with vc-ing for form,  Manual techniques complete to improve patella tracking to reduce adhesions behind patella, pt educated on purpose and technique to  complete at home.   PT Plan: NO SQUATS x 3 months , manual techniques for knee jt mobs and glides, HEP progression, bike as tolerated, standing ther ex, LE stretches, gait/balance activities , walking program progress balance activtieis next session, begin vector stance    Goals Home Exercise Program Pt/caregiver will Perform Home Exercise Program: Independently PT Short Term Goals Time to Complete Short Term Goals: 2 weeks PT Short Term Goal 1: Improve  ROM right knee 0-130 for gait, stairs ,transfers  PT Short Term Goal 1 - Progress: Progressing toward goal PT Short Term Goal 2: Imrove right quadriceps MMT to 4+/5 or greater for return to work, stairs, transfers in /out of car  PT Short Term Goal 2 - Progress: Progressing toward goal PT Short Term Goal 3: manage all house hold activities safely with max 3/10 pain  PT Long Term Goals Time to Complete Long Term Goals: Other (comment) (6 weeks ) PT Long Term Goal 1: ambulate no device without gait deviations  level ground 15 -20 min  PT Long Term Goal 1 - Progress: Progressing toward goal PT Long Term Goal 2: ascend and descend curb/step no handrail safely and independently  PT Long Term Goal 2 - Progress: Progressing toward goal Long Term Goal 3: stand 20-30 min without increased pain for return to work  Long Term Goal 3 Progress: Progressing toward goal  Problem List Patient Active Problem List   Diagnosis Date Noted  . Stiffness of right knee 09/17/2013  . S/P knee replacement 09/02/2013  . Unspecified constipation 09/02/2013  . Bilateral knee pain 08/07/2013  . Microproteinuria 07/16/2013  . Type 2 diabetes mellitus 07/06/2013  . Essential hypertension, benign 07/06/2013  . Hypothyroidism 07/06/2013  . Medial meniscus, posterior horn derangement 01/19/2011  . OA (osteoarthritis) of knee 01/19/2011  .  Mononeuritis leg 01/19/2011    PT - End of Session Activity Tolerance: Patient tolerated treatment well General Behavior  During Therapy: [facility] for tasks assessed/performed  GP    [name] 09/29/2013, 3:07 PM

## 2013-10-01 ENCOUNTER — Ambulatory Visit (HOSPITAL_COMMUNITY)
Admission: RE | Admit: 2013-10-01 | Discharge: 2013-10-01 | Disposition: A | Discharge status: Home / Self Care | Payer: 59 | Source: Ambulatory Visit | Attending: Family Medicine | Admitting: Family Medicine

## 2013-10-01 NOTE — Progress Notes (Signed)
Physical Therapy Treatment Patient Details  Name: Kimberly [name] MRN: [id number] Date of Birth: 01-31-1951  Today's Date: 10/01/2013 Time: 2706-2376 PT Time Calculation (min): 58 min 2 TE, ice 8'  Visit#: 6 of 18  Re-eval: 10/17/13    Authorization: [employer]   Authorization Time Period:    Authorization Visit#: 6 of 10   Subjective: Symptoms/Limitations Symptoms: patient reports 2/10 R knee pain  Precautions/Restrictions     Exercise/Treatments Mobility/Balance        Stretches Press photographer Limitations: slant board  Aerobic Stationary Bike: nu step  8 min L4 Machines for Strengthening      Standing Heel Raises: 20 reps;Limitations Heel Raises Limitations: Toe raises x 20 on Airex Lateral Step Up: Right;20 reps;Hand Hold: 1;Step Height: 6" Forward Step Up: Right;Hand Hold: 1;Hand Hold: 2;Limitations (fingertips) Step Down: Right;20 reps;Hand Hold: 1;Step Height: 6" Rocker Board: 2 minutes SLS: R 25" L15" max of 3 SLS with Vectors: 30"   Sidelying Hip ADduction: 15 reps Balance Exercises Standing Other Standing Exercises: number wall x2     Physical Therapy Assessment and Plan PT Assessment and Plan Clinical Impression Statement: Added balance activites of retro gait and number wall as well as teaching S/L hip adduction and providing HEP for all S/L and prone therex. Continue progressing balance activities and strength;patient reporting 2/10 R knee pain at end of session as well. PT Plan: NO SQUATS x 3 months , manual techniques for knee jt mobs and glides, HEP progression, bike as tolerated, standing ther ex, LE stretches, gait/balance activities , walking program progress balance activtieis next session, begin vector stance:Continue with PT POC    Goals    Problem List Patient Active Problem List   Diagnosis Date Noted  . Stiffness of right knee 09/17/2013  . S/P knee replacement 09/02/2013  . Unspecified constipation 09/02/2013  . Bilateral knee  pain 08/07/2013  . Microproteinuria 07/16/2013  . Type 2 diabetes mellitus 07/06/2013  . Essential hypertension, benign 07/06/2013  . Hypothyroidism 07/06/2013  . Medial meniscus, posterior horn derangement 01/19/2011  . OA (osteoarthritis) of knee 01/19/2011  . Mononeuritis leg 01/19/2011    PT - End of Session Equipment Utilized During Treatment: Gait belt Activity Tolerance: Patient tolerated treatment well General Behavior During Therapy: [facility] for tasks assessed/performed PT Plan of Care PT Home Exercise Plan: provided pt with HEP printout today  GP    Rhanda Lemire ATKINSO 10/01/2013, 2:52 PM

## 2013-10-03 ENCOUNTER — Ambulatory Visit (HOSPITAL_COMMUNITY)
Admission: RE | Admit: 2013-10-03 | Discharge: 2013-10-03 | Disposition: A | Discharge status: Home / Self Care | Payer: 59 | Source: Ambulatory Visit | Attending: Family Medicine | Admitting: Family Medicine

## 2013-10-03 DIAGNOSIS — M25661 Stiffness of right knee, not elsewhere classified: Secondary | ICD-10-CM

## 2013-10-03 NOTE — Progress Notes (Signed)
Physical Therapy Treatment Patient Details  Name: Kimberly [name] MRN: [id number] Date of Birth: Feb 03, 1951  Today's Date: 10/03/2013 Time: 1345-1430 PT Time Calculation (min): 45 min 3762- 1430 TE  Visit#: 7 of 18  Re-eval: 10/17/13 Assessment Diagnosis: right TKR  Surgical Date: 08/25/13 Next MD Visit: Dr. Aline Brochure  Prior Therapy: home health   Authorization: [employer]MR   Authorization Time Period:    Authorization Visit#: 7 of     Subjective: Symptoms/Limitations Symptoms: 0/10 pian , walking at home [employer] without pani the other day, fatigue with prolinged walking and standing  Pertinent History: 63 year old. right TKR replacement 08/25/2013, completed home care , left knee DJD  Pain Assessment Currently in Pain?: No/denies  Precautions/Restrictions     Exercise/Treatments Mobility/Balance        Stretches Gastroc Stretch: 3 reps;30 seconds;Limitations Gastroc Stretch Limitations: slant board  Aerobic Stationary Bike: nu step  8 min L4 Machines for Strengthening   Plyometrics   Standing Heel Raises: 20 reps;Limitations Heel Raises Limitations: Toe raises x 20 on Airex Lateral Step Up: Right;20 reps;Hand Hold: 1;Step Height: 6" Forward Step Up: Right;Hand Hold: 1;Limitations Forward Step Up Limitations: from balance pad  Stairs: 1 flight reciprocal  Seated   Supine   Sidelying   Prone      Balance Exercises Standing Balance Beam: forward sdie and retro weight shifts/steps 7 x each direction B LE  Tandem Gait: 3 reps Retro Gait: 3 reps Step Over Hurdles / Cones: 4 low hurdles   6 reps   Yoga Poses    Seated    Supine       Physical Therapy Assessment and Plan PT Assessment and Plan Clinical Impression Statement: strong progression towards goals , improved stability in tandem gait, improved confidence in balance in public without cane  PT Plan: NO SQUATS x 3 months , manual techniques for knee jt mobs and glides, HEP progression, bike as  tolerated, standing ther ex, LE stretches, gait/balance activities , walking program progress balance activtieis next session, begin vector stance:Continue with PT POC    Goals PT Short Term Goals PT Short Term Goal 1: Improve  ROM right knee 0-130 for gait, stairs ,transfers  PT Short Term Goal 1 - Progress: Progressing toward goal PT Short Term Goal 2: Imrove right quadriceps MMT to 4+/5 or greater for return to work, stairs, transfers in /out of car  PT Short Term Goal 2 - Progress: Progressing toward goal PT Short Term Goal 3: manage all house hold activities safely with max 3/10 pain  PT Short Term Goal 3 - Progress: Met PT Long Term Goals PT Long Term Goal 1: ambulate no device without gait deviations  level ground 15 -20 min  PT Long Term Goal 1 - Progress: Met PT Long Term Goal 2: ascend and descend curb/step no handrail safely and independently  PT Long Term Goal 2 - Progress: Partly met Long Term Goal 3: stand 20-30 min without increased pain for return to work  Long Term Goal 3 Progress: Progressing toward goal  Problem List Patient Active Problem List   Diagnosis Date Noted  . Stiffness of right knee 09/17/2013  . S/P knee replacement 09/02/2013  . Unspecified constipation 09/02/2013  . Bilateral knee pain 08/07/2013  . Microproteinuria 07/16/2013  . Type 2 diabetes mellitus 07/06/2013  . Essential hypertension, benign 07/06/2013  . Hypothyroidism 07/06/2013  . Medial meniscus, posterior horn derangement 01/19/2011  . OA (osteoarthritis) of knee 01/19/2011  . Mononeuritis leg 01/19/2011  GP    Kimberly [name] 10/03/2013, 4:31 PM

## 2013-10-06 ENCOUNTER — Ambulatory Visit (HOSPITAL_COMMUNITY)
Admission: RE | Admit: 2013-10-06 | Discharge: 2013-10-06 | Disposition: A | Discharge status: Home / Self Care | Payer: 59 | Source: Ambulatory Visit | Attending: Family Medicine | Admitting: Family Medicine

## 2013-10-06 DIAGNOSIS — M25661 Stiffness of right knee, not elsewhere classified: Secondary | ICD-10-CM

## 2013-10-06 NOTE — Progress Notes (Signed)
Physical Therapy Treatment Patient Details  Name: Kimberly [name] MRN: [id number] Date of Birth: [birth year]  Today's Date: 10/06/2013 Time: 1345-1430 PT Time Calculation (min): 45 min TE 3557 - 1430  Visit#: 8 of 18  Re-eval: 10/17/13 Assessment Diagnosis: right TKR  Surgical Date: 08/25/13 Next MD Visit: Dr. Aline Brochure  Prior Therapy: home health   Authorization: UMR   Authorization Time Period:    Authorization Visit#: 8 of 10   Subjective: Symptoms/Limitations Symptoms: no new complaints, 0/10 pain  Pertinent History: 63 year old. right TKR replacement 08/25/2013, completed home care , left knee DJD     Exercise/Treatments Mobility/Balance        Stretches Gastroc Stretch: 3 reps;30 seconds;Limitations Gastroc Stretch Limitations: slant board  Aerobic Tread Mill: 10 min 1 . 8 mph      Standing Stairs: 2 flights       Balance Exercises Standing Balance Beam: forward side and retro weight shifts/steps 7 x each direction B LE  Tandem Gait: 3 reps Retro Gait: 3 reps Sidestepping: 3 reps;Limitations Sidestepping Limitations: cone tap overs  Step Over Hurdles / Cones: 4 low hurdles   6 reps  Other Standing Exercises: dyna disc tandem 30 sec x 2, dyna disc B stance 1 min unsupported              Physical Therapy Assessment and Plan PT Assessment and Plan Clinical Impression Statement: AROM flexion improved to 129 degrees flexion , MMT hamstrings 5/5  PT Plan: continue 2 visits then dc     Goals PT Short Term Goals Time to Complete Short Term Goals: 2 weeks PT Short Term Goal 1: Improve  ROM right knee 0-130 for gait, stairs ,transfers  PT Short Term Goal 1 - Progress: Progressing toward goal PT Short Term Goal 2: Imrove right quadriceps MMT to 4+/5 or greater for return to work, stairs, transfers in /out of car  PT Short Term Goal 2 - Progress: Met PT Short Term Goal 3: manage all house hold activities safely with max 3/10 pain  PT Short Term Goal 3 -  Progress: Met PT Long Term Goals PT Long Term Goal 1: ambulate no device without gait deviations  level ground 15 -20 min  PT Long Term Goal 1 - Progress: Met PT Long Term Goal 2: ascend and descend curb/step no handrail safely and independently  PT Long Term Goal 2 - Progress: Partly met Long Term Goal 3: stand 20-30 min without increased pain for return to work  Long Term Goal 3 Progress: Progressing toward goal  Problem List Patient Active Problem List   Diagnosis Date Noted  . Stiffness of right knee 09/17/2013  . S/P knee replacement 09/02/2013  . Unspecified constipation 09/02/2013  . Bilateral knee pain 08/07/2013  . Microproteinuria 07/16/2013  . Type 2 diabetes mellitus 07/06/2013  . Essential hypertension, benign 07/06/2013  . Hypothyroidism 07/06/2013  . Medial meniscus, posterior horn derangement 01/19/2011  . OA (osteoarthritis) of knee 01/19/2011  . Mononeuritis leg 01/19/2011    PT - End of Session Activity Tolerance: Patient tolerated treatment well PT Plan of Care Consulted and Agree with Plan of Care: Patient  GP    Betzy Barbier 10/06/2013, 3:24 PM

## 2013-10-08 ENCOUNTER — Ambulatory Visit (HOSPITAL_COMMUNITY)
Admission: RE | Admit: 2013-10-08 | Discharge: 2013-10-08 | Disposition: A | Discharge status: Home / Self Care | Payer: 59 | Source: Ambulatory Visit | Attending: Family Medicine | Admitting: Family Medicine

## 2013-10-08 NOTE — Progress Notes (Addendum)
Physical Therapy Evaluation  Patient Details  Name: Kimberly [name] MRN: [id number] Date of Birth: 1951/03/21  Today's Date: 10/08/2013 Time: 1415-9733 PT Time Calculation (min): 35 min              Visit#: 9  Diagnosis: right TKR  Surgical Date: 08/25/13 Next MD Visit: Dr. Aline Brochure  Prior Therapy: home health  Authorization: [employer]     Charges:  gait 1351-1400 (8'), ROM/MMT X 1 1401-1410 (8') 1411-1426 (15')    Subjective Symptoms/Limitations Symptoms: Pt states she has nerve pain that comes and goes. Pain Assessment Currently in Pain?: No/denies   Objective: Functional Tests Functional Tests: FOTO FS 72% (was 45)    RLE AROM (degrees) Right Knee Extension: 2 (was 5 degrees) Right Knee Flexion: 120 (was 110 degrees)  RLE Strength Right Knee Flexion: 5/5 (was 4/5) Right Knee Extension: 5/5 (was 4/5)  Exercise/Treatments Aerobic Tread Mill: 8 min 1 . 8 mph      Physical Therapy Assessment and Plan PT Assessment and Plan Clinical Impression Statement: Pt has completed 9 PT visit for Rt TKR therapy.  Pt has met all goals (except AROM 0-130 for Rt knee) and has returned to previous functional level.  Remaining dysfunctions are due to Lt knee instability and pain, which she plans to have replaced next year.  Pt  is compliant and independent with HEP and agrees to discharge at this time. PT Plan: Discharge to HEP.    Goals PT Short Term Goals Time to Complete Short Term Goals: 2 weeks PT Short Term Goal 1: Improve  ROM right knee 0-130 for gait, stairs ,transfers - Progress: Partly met (AROM -2 to 120) PT Short Term Goal 2: Imrove right quadriceps MMT to 4+/5 or greater for return to work, stairs, transfers in /out of car  - Progress: Met PT Short Term Goal 3: manage all house hold activities safely with max 3/10 pain -Progress: Met  PT Long Term Goals PT Long Term Goal 1: ambulate no device without gait deviations  level ground 15 -20 min - Progress: Met PT Long  Term Goal 2: ascend and descend curb/step no handrail safely and independently  - Progress: Met Long Term Goal 3: stand 20-30 min without increased pain for return to work- Progress: Met  Problem List Patient Active Problem List   Diagnosis Date Noted  . Stiffness of right knee 09/17/2013  . S/P knee replacement 09/02/2013  . Unspecified constipation 09/02/2013  . Bilateral knee pain 08/07/2013  . Microproteinuria 07/16/2013  . Type 2 diabetes mellitus 07/06/2013  . Essential hypertension, benign 07/06/2013  . Hypothyroidism 07/06/2013  . Medial meniscus, posterior horn derangement 01/19/2011  . OA (osteoarthritis) of knee 01/19/2011  . Mononeuritis leg 01/19/2011    PT - End of Session Activity Tolerance: Patient tolerated treatment well   Teena Irani, PTA/CLT 10/08/2013, 2:34 PM

## 2013-10-10 ENCOUNTER — Ambulatory Visit (HOSPITAL_COMMUNITY): Payer: 59

## 2013-10-13 ENCOUNTER — Ambulatory Visit (HOSPITAL_COMMUNITY): Payer: 59

## 2013-10-14 ENCOUNTER — Ambulatory Visit: Payer: 59 | Admitting: Orthopedic Surgery

## 2013-10-15 ENCOUNTER — Ambulatory Visit (HOSPITAL_COMMUNITY): Payer: 59 | Admitting: Physical Therapy

## 2013-10-17 ENCOUNTER — Ambulatory Visit (HOSPITAL_COMMUNITY): Payer: 59 | Admitting: Physical Therapy

## 2013-10-21 ENCOUNTER — Ambulatory Visit: Payer: 59 | Admitting: Orthopedic Surgery

## 2013-10-28 ENCOUNTER — Encounter: Payer: Self-pay | Admitting: Orthopedic Surgery

## 2013-10-28 ENCOUNTER — Ambulatory Visit (INDEPENDENT_AMBULATORY_CARE_PROVIDER_SITE_OTHER): Payer: 59 | Admitting: Orthopedic Surgery

## 2013-10-28 VITALS — BP 157/76 | Ht 62.5 in | Wt 205.0 lb

## 2013-10-28 DIAGNOSIS — Z96652 Presence of left artificial knee joint: Secondary | ICD-10-CM | POA: Insufficient documentation

## 2013-10-28 DIAGNOSIS — Z96659 Presence of unspecified artificial knee joint: Secondary | ICD-10-CM

## 2013-10-28 NOTE — Patient Instructions (Signed)
Ret to work mar 23 rd

## 2013-10-28 NOTE — Progress Notes (Signed)
Patient ID: Kimberly [name], female   DOB: 02/10/1951, 63 y.o.   MRN: [id number] Chief Complaint  Patient presents with  . Follow-up    5 week recheck on right knee. DOS 08-25-13. TKA.    Encounter Diagnosis  Name Primary?  . S/P total knee replacement Yes    BP 157/76  Ht 5' 2.5" (1.588 m)  Wt 205 lb (92.987 kg)  BMI 36.87 kg/m2  Approximately 10 weeks postop right total knee doing well no complaints. She does occasionally feel a click in the knee in the morning but we couldn't reproduce it in the office. Her knee feels stable she has full extension she has 115 of knee flexion she has no pain and is requiring no medication she wishes to return to work 8 hours a day on the 23rd  Followup in 3 months improve work return at work on the 23rd

## 2013-10-29 ENCOUNTER — Other Ambulatory Visit: Payer: Self-pay | Admitting: Family Medicine

## 2013-11-21 ENCOUNTER — Telehealth: Payer: Self-pay | Admitting: Family Medicine

## 2013-11-21 NOTE — Telephone Encounter (Signed)
Discussed with patient. Transferred to front to schedule office visit for next week.

## 2013-11-21 NOTE — Telephone Encounter (Signed)
Patient says that she has been out of work since her knee replacement and overall her blood pressure has been running pretty normal. Her last BP when she saw Dr Aline Brochure was 157/76 and since she has returned to work, her top number has stayed between 170-195 and her bottom number is nothing lower than 86. Today after taking her meds when getting to work, her blood pressure read 186/95 and right now it is 170/80. Please advise.

## 2013-11-21 NOTE — Telephone Encounter (Signed)
These numbers are significant plz sched ov next wk to evaluate

## 2013-11-26 ENCOUNTER — Encounter: Payer: Self-pay | Admitting: Family Medicine

## 2013-11-26 ENCOUNTER — Ambulatory Visit (INDEPENDENT_AMBULATORY_CARE_PROVIDER_SITE_OTHER): Payer: 59 | Admitting: Family Medicine

## 2013-11-26 VITALS — BP 190/100 | Ht 62.0 in | Wt 207.2 lb

## 2013-11-26 DIAGNOSIS — E119 Type 2 diabetes mellitus without complications: Secondary | ICD-10-CM

## 2013-11-26 DIAGNOSIS — I10 Essential (primary) hypertension: Secondary | ICD-10-CM

## 2013-11-26 DIAGNOSIS — R809 Proteinuria, unspecified: Secondary | ICD-10-CM

## 2013-11-26 MED ORDER — LOSARTAN POTASSIUM 100 MG PO TABS: ORAL_TABLET | ORAL | Status: DC

## 2013-11-26 MED ORDER — VENLAFAXINE HCL ER 150 MG PO CP24: ORAL_CAPSULE | ORAL | Status: DC

## 2013-11-26 MED ORDER — METFORMIN HCL 500 MG PO TABS
500.0000 mg | ORAL_TABLET | Freq: Two times a day (BID) | ORAL | Status: DC
Start: 2013-11-26 — End: 2014-03-03

## 2013-11-26 MED ORDER — LEVOTHYROXINE SODIUM 137 MCG PO TABS: 137.0000 ug | ORAL_TABLET | Freq: Every day | ORAL | Status: DC

## 2013-11-26 MED ORDER — HYDROCHLOROTHIAZIDE 25 MG PO TABS: 25.0000 mg | ORAL_TABLET | Freq: Every day | ORAL | Status: DC

## 2013-11-26 NOTE — Progress Notes (Signed)
   Subjective:    Patient ID: Kimberly [name], female    DOB: [birth year], 63 y.o.   MRN: [id number]  Hypertension This is a recurrent problem. The current episode started 1 to 4 weeks ago. The problem has been gradually worsening since onset. The problem is uncontrolled. Associated symptoms include headaches. There are no associated agents to hypertension. There are no known risk factors for coronary artery disease. Treatments tried: losartan. The current treatment provides no improvement. There are no compliance problems.   Patient has no other concerns at this time.   Type two diab last A1c did well. She was checked several weeks ago through [employer] [place] to work. Was told that it was 6.5. Trying to watch diet. No sugars running good between 90 and 120 in the morning. Exercise not the best currently. Next  Patient states eye Dr. last year.  So Kimberly [name] for wellness exam but no focus diabetes visit for a long time.  Urinalysis last fall revealed micro-protein. Discussed at length today. Blood work renal function good. Losartan will help. Good diabetes control he'll.    Review of Systems  Neurological: Positive for headaches.   right knee pain post replacement surgery no chest pain no back pain no abdominal pain no change in bowel habits     Objective:   Physical Exam 170/92 on repeat. Alert no acute distress. HEENT normal. Neck supple. Lungs clear. Heart rare rhythm. Ankles without edema.      Assessment & Plan:  Impression 1 type 2 diabetes apparent good control. #2 micro-proteinuria discussed at length. #3 hypertension suboptimal discussed length. #4 chronic knee pain discussed plan add hydrochlorothiazide 25 each morning. Diet exercise discussed encourage. Check sugars. Recheck in several months. WSL

## 2013-12-02 ENCOUNTER — Telehealth: Payer: Self-pay | Admitting: Family Medicine

## 2013-12-02 MED ORDER — AMOXICILLIN 500 MG PO CAPS: 500.0000 mg | ORAL_CAPSULE | Freq: Three times a day (TID) | ORAL | Status: DC

## 2013-12-02 NOTE — Telephone Encounter (Signed)
amox 500 tid ten d 

## 2013-12-02 NOTE — Telephone Encounter (Signed)
Med sent to [facility]. Pt notified.

## 2013-12-02 NOTE — Telephone Encounter (Signed)
[place]  Pt was seen on 11/26/13 an spoke with Dr Richardson Landry about her sinus headaches  During this visit. She states that is has gotten worse since then, to the point  Its making her throat sore and vomiting from the severe headache.   Wants to know if he will call in an antibiotic since she was just here

## 2013-12-08 ENCOUNTER — Telehealth: Payer: Self-pay | Admitting: Orthopedic Surgery

## 2013-12-08 NOTE — Telephone Encounter (Addendum)
Patient called with questions regarding knee/status post total knee 08/25/13.  States she returned to work at [employer] as per release for full duty 12/01/13. States week#1 was fine; states week#2 she was not feeling well, and she went to her primary care, Dr. Wolfgang Phoenix, and was prescribed "water pills", due to increased blood pressure, which she said helped.  When she returned to work for week#3, she had started to not feel well again, therefore, per phone with Dr. Wolfgang Phoenix, was prescribed 500mg  Amoxicillan. She states that now that she has returned to work today, for her 12-hour shift, that her knee "feels different."  Schedule appointment for this week, or other recommendation?  There is a potential cancellation slot for tomorrow, 12/09/13.

## 2013-12-08 NOTE — Telephone Encounter (Signed)
Routing to Dr Harrison 

## 2013-12-09 NOTE — Telephone Encounter (Signed)
OK seems like a postop problem  so protocol is to schedule appointment as soon as possible

## 2013-12-09 NOTE — Telephone Encounter (Signed)
Routing to Carol Foltz to schedule 

## 2013-12-09 NOTE — Telephone Encounter (Signed)
Called patient, scheduled appointment accordingly.

## 2013-12-11 ENCOUNTER — Ambulatory Visit (INDEPENDENT_AMBULATORY_CARE_PROVIDER_SITE_OTHER): Payer: 59

## 2013-12-11 ENCOUNTER — Ambulatory Visit (INDEPENDENT_AMBULATORY_CARE_PROVIDER_SITE_OTHER): Payer: 59 | Admitting: Orthopedic Surgery

## 2013-12-11 VITALS — BP 143/78 | Ht 62.0 in | Wt 197.0 lb

## 2013-12-11 DIAGNOSIS — L0291 Cutaneous abscess, unspecified: Secondary | ICD-10-CM

## 2013-12-11 DIAGNOSIS — M25569 Pain in unspecified knee: Secondary | ICD-10-CM

## 2013-12-11 DIAGNOSIS — M25561 Pain in right knee: Secondary | ICD-10-CM

## 2013-12-11 DIAGNOSIS — L0390 Cellulitis, unspecified: Secondary | ICD-10-CM

## 2013-12-11 NOTE — Patient Instructions (Addendum)
After one week off antibiotics you will need labs drawn on 12/22/13

## 2013-12-11 NOTE — Progress Notes (Signed)
Patient ID: Kimberly [name], female   DOB: 10-12-1950, 63 y.o.   MRN: [id number]  Chief Complaint  Patient presents with  . Follow-up    Recheck right knee pain s/p TKA DOS 08/25/13   3 months post total knee uncomplicated patient went back to work did well the first week the second week she developed a sinus infection hypertension and started having some anterolateral knee pain. She noticed the third week that she was still having sinus headache and sinusitis and was treated with amoxicillin. At that point she noticed that her knee was hurting and since then she's had some trouble going down the steps and a tightness in the anterolateral and anterior part of the knee. The knee was warm on the lateral side but not diffuse there was some lateral swelling no redness. The pain is actually starting to go away other than some anterolateral knee pain along the iliotibial band  She presents for reevaluation.  X-rays are normal today. She still on her amoxicillin until Saturday  Exam reveals a normal appearing knee with no effusion she is tender over the anterolateral knee iliotibial band incision has healed nicely with no signs of redness or warmth but no joint effusion. She perform straight leg raise easily and has 115 of knee flexion. The knee is stable anteriorly posteriorly and medial lateral  Neurovascular exam is intact  Overall impression iliotibial band tendinitis However we should rule out infection therefore, one week after being off antibiotics I would like to get a sedimentation rate C-reactive protein and white count and followup with me on the 30th

## 2013-12-25 ENCOUNTER — Encounter: Payer: Self-pay | Admitting: Orthopedic Surgery

## 2013-12-25 ENCOUNTER — Ambulatory Visit (INDEPENDENT_AMBULATORY_CARE_PROVIDER_SITE_OTHER): Payer: 59 | Admitting: Orthopedic Surgery

## 2013-12-25 VITALS — BP 127/72 | Ht 62.0 in | Wt 197.0 lb

## 2013-12-25 DIAGNOSIS — M25561 Pain in right knee: Secondary | ICD-10-CM

## 2013-12-25 DIAGNOSIS — M25569 Pain in unspecified knee: Secondary | ICD-10-CM

## 2013-12-25 DIAGNOSIS — Z96659 Presence of unspecified artificial knee joint: Secondary | ICD-10-CM

## 2013-12-25 NOTE — Progress Notes (Signed)
Patient ID: Kimberly [name], female   DOB: [birth year], 63 y.o.   MRN: [id number]  Chief Complaint  Patient presents with  . Results    2 week recheck and lab results    White count is 6 the sedimentation rate is elevated C-reactive protein is normal  Pain is controlled with ibuprofen twice a day  Pain runs from the hip down to the knee I suspect is either tendinitis and iliotibial band or altered gait secondary realignment of the knee.  Left knee bothering her so we are getting some pelvic obliquity  Recommend 6 week followup recheck the knee no labs or x-rays needed at that time

## 2014-01-03 ENCOUNTER — Ambulatory Visit: Payer: 59

## 2014-01-22 ENCOUNTER — Telehealth: Payer: Self-pay | Admitting: Family Medicine

## 2014-01-22 MED ORDER — DICLOFENAC SODIUM 75 MG PO TBEC: DELAYED_RELEASE_TABLET | ORAL | Status: DC

## 2014-01-22 MED ORDER — METHOCARBAMOL 500 MG PO TABS: 500.0000 mg | ORAL_TABLET | Freq: Four times a day (QID) | ORAL | Status: DC | PRN

## 2014-01-22 NOTE — Telephone Encounter (Signed)
Give same amnt as harrison gave, try to avoid taking forever

## 2014-01-22 NOTE — Telephone Encounter (Signed)
Was originally prescribed by Dr. Aline Brochure

## 2014-01-22 NOTE — Telephone Encounter (Signed)
Patient stated she got confused wanted a refill of diclofenac for arthritis. Med sent electronically to pharmacy. Patient notified.

## 2014-01-22 NOTE — Telephone Encounter (Signed)
Patient needs Rx for methocarbamol to [facility] in [place]. 90 day supply

## 2014-02-03 ENCOUNTER — Ambulatory Visit (INDEPENDENT_AMBULATORY_CARE_PROVIDER_SITE_OTHER): Payer: 59 | Admitting: Orthopedic Surgery

## 2014-02-03 VITALS — Ht 62.0 in | Wt 197.0 lb

## 2014-02-03 DIAGNOSIS — M25561 Pain in right knee: Secondary | ICD-10-CM

## 2014-02-03 DIAGNOSIS — Z96659 Presence of unspecified artificial knee joint: Secondary | ICD-10-CM

## 2014-02-03 DIAGNOSIS — M25569 Pain in unspecified knee: Secondary | ICD-10-CM

## 2014-02-03 NOTE — Progress Notes (Signed)
Patient ID: Kimberly [name], female   DOB: Sep 02, 1950, 63 y.o.   MRN: [id number]  Chief Complaint  Patient presents with  . Follow-up    3 month recheck post op right TKA DOS 08/25/14    Approximately 6 month followup status post right total knee there was some question of sinusitis and then knee pain and swelling laboratory studies weren't conclusive pain went away she has some lateral iliotibial band symptoms every now and then she is happy with her knee now incision turn the corner about 5 months. She is interested in having a left knee replacement in January of next year we will see her in December for her annual x-ray on the right knee in the preop films on the left knee. Review of systems is now negative  General appearance is normal, the patient is alert and oriented x3 with normal mood and affect. Ht 5\' 2"  (1.575 m)  Wt 197 lb (89.359 kg)  BMI 36.02 kg/m2  Right knee looks good the right knee is stable there is no effusion there is no warmth tenderness swelling or redness. No peripheral edema is noted. Her ambulation is now normal.

## 2014-02-03 NOTE — Patient Instructions (Signed)
Return in early Dec for xrays right tka and preop xrays for left tka

## 2014-02-25 ENCOUNTER — Ambulatory Visit: Payer: 59 | Admitting: Family Medicine

## 2014-03-03 ENCOUNTER — Ambulatory Visit (INDEPENDENT_AMBULATORY_CARE_PROVIDER_SITE_OTHER): Payer: 59 | Admitting: Family Medicine

## 2014-03-03 ENCOUNTER — Encounter: Payer: Self-pay | Admitting: Family Medicine

## 2014-03-03 VITALS — BP 154/98 | Ht 64.0 in | Wt 206.0 lb

## 2014-03-03 DIAGNOSIS — E119 Type 2 diabetes mellitus without complications: Secondary | ICD-10-CM

## 2014-03-03 DIAGNOSIS — Z23 Encounter for immunization: Secondary | ICD-10-CM

## 2014-03-03 DIAGNOSIS — M25661 Stiffness of right knee, not elsewhere classified: Secondary | ICD-10-CM

## 2014-03-03 DIAGNOSIS — M25669 Stiffness of unspecified knee, not elsewhere classified: Secondary | ICD-10-CM

## 2014-03-03 LAB — POCT GLYCOSYLATED HEMOGLOBIN (HGB A1C): Hemoglobin A1C: 6

## 2014-03-03 MED ORDER — LEVOTHYROXINE SODIUM 137 MCG PO TABS: 137.0000 ug | ORAL_TABLET | Freq: Every day | ORAL | Status: DC

## 2014-03-03 MED ORDER — DICLOFENAC SODIUM 75 MG PO TBEC: DELAYED_RELEASE_TABLET | ORAL | Status: DC

## 2014-03-03 MED ORDER — VENLAFAXINE HCL ER 150 MG PO CP24
ORAL_CAPSULE | ORAL | Status: DC
Start: 2014-03-03 — End: 2014-05-25

## 2014-03-03 MED ORDER — METOPROLOL SUCCINATE ER 50 MG PO TB24: 50.0000 mg | ORAL_TABLET | Freq: Every day | ORAL | Status: DC

## 2014-03-03 MED ORDER — VENLAFAXINE HCL ER 150 MG PO CP24: ORAL_CAPSULE | ORAL | Status: DC

## 2014-03-03 MED ORDER — LOSARTAN POTASSIUM 100 MG PO TABS: ORAL_TABLET | ORAL | Status: DC

## 2014-03-03 MED ORDER — METFORMIN HCL 500 MG PO TABS: 500.0000 mg | ORAL_TABLET | Freq: Two times a day (BID) | ORAL | Status: DC

## 2014-03-03 NOTE — Progress Notes (Signed)
   Subjective:    Patient ID: Kimberly [name], female    DOB: 1951/06/17, 63 y.o.   MRN: [id number]  Diabetes She presents for her follow-up diabetic visit. She has type 2 diabetes mellitus. She is following a generally healthy and diabetic diet. Frequency home blood tests: 1 time per week. She does not see a podiatrist.Eye exam is not current.   Takes hctz only about 3 times weekly when needed because when taking every day was having pain in kidney area.   Results for orders placed in visit on 03/03/14  POCT GLYCOSYLATED HEMOGLOBIN (HGB A1C)      Result Value Ref Range   Hemoglobin A1C 6.0      Pt had bad headache. Pt took fluid pills felt really bad pain in the kidneys.  Taking hctz, cannot tolerate daily doses  Took atenolol in the past, was able to get the numbers down, and well.  Ongoing joint pain left knee. Just had right knee replacement. It has helped her. Left knee giving her challenges.  Patient reports her depression is stable. Tolerating the meds well no obvious side effects.  Now post right knee repl, getting back into exercise, not hurting s much coming home  Feels so much better,    Review of Systems No chest pain no headache no back pain no abdominal pain no change in bowel habits no blood in stool ROS otherwise negative    Objective:   Physical Exam  Alert no apparent distress blood pressure is still elevated on repeat lungs clear. Heart rare rhythm. Ankles without edema. C. diabetic foot exam      Assessment & Plan:  Impression 1 hypertension suboptimal in with noncompliance. Taking diuretic only 3 times per week because of perceived side effects #2 type 2 diabetes good tight control discussed #3 progressive arthritis discuss plan stop HCTZ start Toprol-XL 50 mg daily. Diet exercise discussed in encourage. Recheck in several months. WSL

## 2014-05-25 ENCOUNTER — Other Ambulatory Visit: Payer: Self-pay | Admitting: *Deleted

## 2014-05-25 ENCOUNTER — Telehealth: Payer: Self-pay | Admitting: *Deleted

## 2014-05-25 MED ORDER — METFORMIN HCL 500 MG PO TABS: 500.0000 mg | ORAL_TABLET | Freq: Two times a day (BID) | ORAL | Status: DC

## 2014-05-25 MED ORDER — LOSARTAN POTASSIUM 100 MG PO TABS: ORAL_TABLET | ORAL | Status: DC

## 2014-05-25 MED ORDER — DICLOFENAC SODIUM 75 MG PO TBEC: DELAYED_RELEASE_TABLET | ORAL | Status: DC

## 2014-05-25 MED ORDER — VENLAFAXINE HCL ER 150 MG PO CP24: ORAL_CAPSULE | ORAL | Status: DC

## 2014-05-25 MED ORDER — METOPROLOL SUCCINATE ER 50 MG PO TB24: 50.0000 mg | ORAL_TABLET | Freq: Every day | ORAL | Status: DC

## 2014-05-25 MED ORDER — LEVOTHYROXINE SODIUM 137 MCG PO TABS: 137.0000 ug | ORAL_TABLET | Freq: Every day | ORAL | Status: DC

## 2014-05-25 MED ORDER — LEVOTHYROXINE SODIUM 137 MCG PO TABS
137.0000 ug | ORAL_TABLET | Freq: Every day | ORAL | Status: DC
Start: 2014-05-25 — End: 2014-05-25

## 2014-05-25 NOTE — Telephone Encounter (Signed)
Fax from [facility] order requesting 90 day supply on diclofenac 75 mg. Pt last seen 03/03/14 for med check.

## 2014-05-25 NOTE — Telephone Encounter (Signed)
Ok times one plus a ref

## 2014-05-25 NOTE — Telephone Encounter (Signed)
Medication sent to pharmacy  

## 2014-06-02 ENCOUNTER — Ambulatory Visit (INDEPENDENT_AMBULATORY_CARE_PROVIDER_SITE_OTHER): Payer: 59 | Admitting: Family Medicine

## 2014-06-02 ENCOUNTER — Encounter: Payer: Self-pay | Admitting: Family Medicine

## 2014-06-02 ENCOUNTER — Ambulatory Visit: Payer: 59 | Admitting: Family Medicine

## 2014-06-02 VITALS — BP 150/92 | Ht 64.0 in | Wt 203.0 lb

## 2014-06-02 DIAGNOSIS — I10 Essential (primary) hypertension: Secondary | ICD-10-CM

## 2014-06-02 DIAGNOSIS — K2990 Gastroduodenitis, unspecified, without bleeding: Secondary | ICD-10-CM

## 2014-06-02 DIAGNOSIS — K2970 Gastritis, unspecified, without bleeding: Secondary | ICD-10-CM | POA: Insufficient documentation

## 2014-06-02 DIAGNOSIS — M1711 Unilateral primary osteoarthritis, right knee: Secondary | ICD-10-CM

## 2014-06-02 DIAGNOSIS — E119 Type 2 diabetes mellitus without complications: Secondary | ICD-10-CM

## 2014-06-02 DIAGNOSIS — Z79899 Other long term (current) drug therapy: Secondary | ICD-10-CM

## 2014-06-02 DIAGNOSIS — K219 Gastro-esophageal reflux disease without esophagitis: Secondary | ICD-10-CM | POA: Insufficient documentation

## 2014-06-02 DIAGNOSIS — E039 Hypothyroidism, unspecified: Secondary | ICD-10-CM

## 2014-06-02 LAB — POCT GLYCOSYLATED HEMOGLOBIN (HGB A1C): HEMOGLOBIN A1C: 6.3

## 2014-06-02 MED ORDER — HYDROCHLOROTHIAZIDE 25 MG PO TABS: 25.0000 mg | ORAL_TABLET | Freq: Every day | ORAL | Status: DC

## 2014-06-02 MED ORDER — SUCRALFATE 1 G PO TABS: ORAL_TABLET | ORAL | Status: DC

## 2014-06-02 MED ORDER — PANTOPRAZOLE SODIUM 40 MG PO TBEC: 40.0000 mg | DELAYED_RELEASE_TABLET | Freq: Every day | ORAL | Status: DC

## 2014-06-02 NOTE — Progress Notes (Signed)
   Subjective:    Patient ID: Kimberly [name], female    DOB: 04/01/1951, 62 y.o.   MRN: [id number]  Diabetes She presents for her follow-up diabetic visit. She has type 2 diabetes mellitus. Her disease course has been stable. There are no hypoglycemic associated symptoms. There are no hypoglycemic complications. Symptoms are stable. There are no diabetic complications. There are no known risk factors for coronary artery disease. Current diabetic treatments: Metformin. She is compliant with treatment all of the time.  130 to 62mostly  Not eating too much  Mid epigastric pain,  Patient states that she has diarrhea 3-4 times a week. She is always bloated. When her stomach is empty she gets a pain center of her stomach beneath her breast. Can tolerate spinach salads. Patient states that all of a sudden her insides hurt & she gets nausea especially when she hasn't eaten. Patient states that she just hasn't been feeling well lately. Patient states it's been the last 3-4 weeks she's been feeling this way.  Patient says she also needs a prescription for a new glucose meter & supplies.  Patient notes neck discomfort at times particularly when she turns her head back and forth. A bit concerned about her thyroid with history of nodules.  Results for orders placed in visit on 03/03/14  POCT GLYCOSYLATED HEMOGLOBIN (HGB A1C)      Result Value Ref Range   Hemoglobin A1C 6.0     Patient states overall her mood is stable. Review of Systems No headache no chest pain no change in bowel habits no blood in stool ROS otherwise negative    Objective:   Physical Exam  Blood pressure 132/80 4 repeat. Vital stable. HEENT normal. Lungs clear. Heart regular in rhythm. Epigastrium moderate tenderness to deep palpation no rebound no guarding  Thyroid region nonpalpable no obvious tenderness. No obvious enlargement or nodule    Assessment & Plan:  Impression 1 gastritis with reflux discussed #2 progressive  arthritis pain. Have advised patient to cut back on her anti-inflammatory usage. #3 type 2 diabetes controlled #4 hypertension controlled good. #5 hypothyroidism status uncertain plan appropriate blood work. Diet exercise discussed. Add protonic stably. Add Carafate a.c. and at bedtime next 2 weeks. Change diuretic to daily. Plan followup in 3 months. Patient to set up visit to GI. This is primarily for loose stools. And also primarily for screening. I expect her epigastric discomfort to fade, call if persists. 35-40 minutes spent most in discussion WSL

## 2014-06-03 LAB — BASIC METABOLIC PANEL
BUN: 28 mg/dL — AB (ref 6–23)
CO2: 25 mEq/L (ref 19–32)
CREATININE: 0.91 mg/dL (ref 0.50–1.10)
Calcium: 9.2 mg/dL (ref 8.4–10.5)
Chloride: 106 mEq/L (ref 96–112)
GLUCOSE: 151 mg/dL — AB (ref 70–99)
Potassium: 4.3 mEq/L (ref 3.5–5.3)
Sodium: 139 mEq/L (ref 135–145)

## 2014-06-03 LAB — HEPATIC FUNCTION PANEL
ALT: 48 U/L — ABNORMAL HIGH (ref 0–35)
AST: 27 U/L (ref 0–37)
Albumin: 4.2 g/dL (ref 3.5–5.2)
Alkaline Phosphatase: 76 U/L (ref 39–117)
Bilirubin, Direct: 0.1 mg/dL (ref 0.0–0.3)
Indirect Bilirubin: 0.3 mg/dL (ref 0.2–1.2)
TOTAL PROTEIN: 7.3 g/dL (ref 6.0–8.3)
Total Bilirubin: 0.4 mg/dL (ref 0.2–1.2)

## 2014-06-03 LAB — LIPID PANEL
CHOLESTEROL: 134 mg/dL (ref 0–200)
HDL: 36 mg/dL — ABNORMAL LOW (ref >39)
LDL Cholesterol: 58 mg/dL (ref 0–99)
TRIGLYCERIDES: 201 mg/dL — AB (ref <150)
Total CHOL/HDL Ratio: 3.7 Ratio
VLDL: 40 mg/dL (ref 0–40)

## 2014-06-03 LAB — MICROALBUMIN, URINE: MICROALB UR: 1.1 mg/dL (ref <2.0)

## 2014-06-03 LAB — TSH: TSH: 0.177 u[IU]/mL — ABNORMAL LOW (ref 0.350–4.500)

## 2014-06-05 ENCOUNTER — Telehealth: Payer: Self-pay | Admitting: Family Medicine

## 2014-06-05 ENCOUNTER — Ambulatory Visit: Payer: 59 | Admitting: Family Medicine

## 2014-06-05 NOTE — Telephone Encounter (Signed)
Patient needs results to lab work.

## 2014-07-28 ENCOUNTER — Encounter: Payer: Self-pay | Admitting: Orthopedic Surgery

## 2014-07-28 ENCOUNTER — Ambulatory Visit (INDEPENDENT_AMBULATORY_CARE_PROVIDER_SITE_OTHER): Payer: 59 | Admitting: Orthopedic Surgery

## 2014-07-28 ENCOUNTER — Ambulatory Visit (INDEPENDENT_AMBULATORY_CARE_PROVIDER_SITE_OTHER): Payer: 59

## 2014-07-28 VITALS — BP 141/79 | Ht 64.0 in | Wt 203.0 lb

## 2014-07-28 DIAGNOSIS — M129 Arthropathy, unspecified: Secondary | ICD-10-CM

## 2014-07-28 DIAGNOSIS — M1712 Unilateral primary osteoarthritis, left knee: Secondary | ICD-10-CM

## 2014-07-28 DIAGNOSIS — Z96651 Presence of right artificial knee joint: Secondary | ICD-10-CM

## 2014-07-28 NOTE — Progress Notes (Signed)
Patient ID: Kimberly [name], female   DOB: [birth year], 63 y.o.   MRN: [id number] Follow-up right total knee  Preop for left total knee  Chief Complaint  Patient presents with  . Follow-up    6 month follow up + xrays bilat. knees   Left/discuss surgery, Right/TKA 1 YR follow up    HPI Kimberly [name] is a 63 y.o. female.   HPI Kimberly [name] is 63 years old she is one year out from a right total knee and she did well. She is interested in left total knee as well.  The right knee is functioning well she does have some soreness around her pelvis tendons. Postoperatively she had what we thought may have been a mild infection which was treated appropriately and resolved with no further consequences  In terms of her left knee she has significant pain although she still has good motion and good function.  However, the pain is preventing her from doing her normal activities and is interfering with her work duties which include standing and walking. She understands all the risks and benefits of the surgery were reviewed that with her again today she had no further questions and wishes to proceed with a left total knee arthroplasty.   Past Medical History  Diagnosis Date  . Diabetes mellitus   . High blood pressure   . Hypothyroid   . Arthritis   . Obesity   . Depression   . PONV (postoperative nausea and vomiting)     Past Surgical History  Procedure Laterality Date  . Carpal tunnel release Right   . Thyroid surgery      removal  . Abdominal hysterectomy    . Bunions Right   . Knee arthroscopy Right   . Total knee arthroplasty Right 08/25/2013    Procedure: TOTAL KNEE ARTHROPLASTY;  Surgeon: Carole Civil, MD;  Location: [facility];  Service: Orthopedics;  Laterality: Right;    Family History  Problem Relation Age of Onset  . Cancer    . Diabetes    . Congestive Heart Failure Father     Social History History  Substance Use Topics  . Smoking status: Never Smoker   .  Smokeless tobacco: Not on file  . Alcohol Use: No    Allergies  Allergen Reactions  . Ace Inhibitors     Induced cough    Current Outpatient Prescriptions  Medication Sig Dispense Refill  . aspirin 81 MG tablet Take 81 mg by mouth daily.    . diclofenac (VOLTAREN) 75 MG EC tablet TAKE 1 TABLET BY MOUTH UP TO TWICE DAILY WITH FOOD 180 tablet 1  . hydrochlorothiazide (HYDRODIURIL) 25 MG tablet Take 1 tablet (25 mg total) by mouth daily. 90 tablet 1  . levothyroxine (SYNTHROID, LEVOTHROID) 137 MCG tablet Take 1 tablet (137 mcg total) by mouth daily before breakfast. 90 tablet 1  . losartan (COZAAR) 100 MG tablet TAKE 1 TABLET (100 MG TOTAL) qam 90 tablet 1  . metFORMIN (GLUCOPHAGE) 500 MG tablet Take 1 tablet (500 mg total) by mouth 2 (two) times daily with a meal. 180 tablet 1  . metoprolol succinate (TOPROL-XL) 50 MG 24 hr tablet Take 1 tablet (50 mg total) by mouth at bedtime. 90 tablet 1  . OVER THE COUNTER MEDICATION Take 1 tablet by mouth daily. OTC estroven    . pantoprazole (PROTONIX) 40 MG tablet Take 1 tablet (40 mg total) by mouth daily. 90 tablet 1  . venlafaxine XR (EFFEXOR-XR) 150 MG  24 hr capsule TAKE 1 CAPSULE BY MOUTH DAILY 90 capsule 1  . methocarbamol (ROBAXIN) 500 MG tablet Take 1 tablet (500 mg total) by mouth every 6 (six) hours as needed for muscle spasms. (Patient not taking: Reported on 07/28/2014) 60 tablet 0  . sucralfate (CARAFATE) 1 G tablet Take 1 tablet po AC and HS for 2 weeks 56 tablet 0   No current facility-administered medications for this visit.   Facility-Administered Medications Ordered in Other Visits  Medication Dose Route Frequency Provider Last Rate Last Dose  . bupivacaine liposome (EXPAREL) 1.3 % injection 266 mg  20 mL Infiltration Once Carole Civil, MD        Review of Systems Review of Systems  All other systems reviewed and are negative.   Blood pressure 141/79, height 5\' 4"  (1.626 m), weight 203 lb (92.08 kg).  Physical  Exam Physical Exam Gen. appearance is normal BP 141/79 mmHg  Ht 5\' 4"  (1.626 m)  Wt 203 lb (92.08 kg)  BMI 34.83 kg/m2, [< 40] She is awake alert and oriented to person place and time. Her mood is happy and her affect is pleasant  She is ambulatory without assistive device  Her upper extremities are well aligned without contracture subluxation atrophy tremor skin disease. Pulse and perfusion normal sensory function intact  Her right lower extremity has a well-functioning total knee with an intact incision 115 of flexion, stability normal motor exam normal skin intact pulses good sensation normal  Left knee varus deformity medial compartment pain tenderness no effusion ligament stable, motor exam normal, skin intact. Pulse and perfusion normal sensation normal.   Data Reviewed Today's x-ray shows moderate to severe osteoarthritis with varus deformity  Assessment    Encounter Diagnoses  Name Primary?  . Status post total right knee replacement Yes  . Arthritis of left knee         Plan    Left total knee arthroplasty 09/10/2015       Arther Abbott 07/28/2014, 10:32 AM

## 2014-07-28 NOTE — Patient Instructions (Signed)
Surgery 09/09/13 Left total knee   You have decided to proceed with knee replacement surgery. You have decided not to continue with nonoperative measures such as but not limited to oral medication, weight loss, activity modification, physical therapy, bracing, or injection.  We will perform the procedure commonly known as total knee replacement. Some of the risks associated with knee replacement surgery include but are not limited to Bleeding Infection Swelling Stiffness Blood clot Pain that persists even after surgery  Infection is especially devastating complication of knee surgery although rare. If infection does occur your implant will usually have to be removed and several surgeries will be needed to eradicate the infection prior to performing a repeat replacement.   If you're not comfortable with these risks and would like to continue with nonoperative treatment please let Dr. Aline Brochure know prior to your surgery.

## 2014-07-29 ENCOUNTER — Other Ambulatory Visit: Payer: Self-pay | Admitting: *Deleted

## 2014-07-29 MED ORDER — BUPIVACAINE LIPOSOME 1.3 % IJ SUSP: 20.0000 mL | Freq: Once | INTRAMUSCULAR | Status: DC

## 2014-08-07 ENCOUNTER — Telehealth: Payer: Self-pay | Admitting: Orthopedic Surgery

## 2014-08-07 NOTE — Telephone Encounter (Signed)
Patient called to relay that she has been sent a request to appear for jury duty September 07, 2014, and is scheduled for total knee surgery September 09, 2013.  She is asking if she may receive a letter excusing her from the 6-week jury duty due to medical/surgical reasons.  Ph# [phone]

## 2014-08-07 NOTE — Telephone Encounter (Signed)
YES

## 2014-08-10 ENCOUNTER — Encounter: Payer: Self-pay | Admitting: Orthopedic Surgery

## 2014-08-11 ENCOUNTER — Ambulatory Visit (INDEPENDENT_AMBULATORY_CARE_PROVIDER_SITE_OTHER): Payer: 59 | Admitting: Family Medicine

## 2014-08-11 ENCOUNTER — Encounter: Payer: Self-pay | Admitting: Family Medicine

## 2014-08-11 VITALS — BP 148/90 | Ht 63.0 in | Wt 207.0 lb

## 2014-08-11 DIAGNOSIS — M791 Myalgia, unspecified site: Secondary | ICD-10-CM

## 2014-08-11 DIAGNOSIS — E119 Type 2 diabetes mellitus without complications: Secondary | ICD-10-CM

## 2014-08-11 DIAGNOSIS — E1121 Type 2 diabetes mellitus with diabetic nephropathy: Secondary | ICD-10-CM

## 2014-08-11 DIAGNOSIS — I10 Essential (primary) hypertension: Secondary | ICD-10-CM

## 2014-08-11 MED ORDER — METOPROLOL SUCCINATE ER 50 MG PO TB24: 50.0000 mg | ORAL_TABLET | Freq: Every day | ORAL | Status: DC

## 2014-08-11 MED ORDER — METFORMIN HCL 500 MG PO TABS: 500.0000 mg | ORAL_TABLET | Freq: Two times a day (BID) | ORAL | Status: DC

## 2014-08-11 MED ORDER — VERAPAMIL HCL ER 180 MG PO TBCR: 180.0000 mg | EXTENDED_RELEASE_TABLET | Freq: Every day | ORAL | Status: DC

## 2014-08-11 MED ORDER — PANTOPRAZOLE SODIUM 40 MG PO TBEC: 40.0000 mg | DELAYED_RELEASE_TABLET | Freq: Every day | ORAL | Status: DC

## 2014-08-11 MED ORDER — DICLOFENAC SODIUM 75 MG PO TBEC: DELAYED_RELEASE_TABLET | ORAL | Status: DC

## 2014-08-11 MED ORDER — LOSARTAN POTASSIUM 100 MG PO TABS: ORAL_TABLET | ORAL | Status: DC

## 2014-08-11 MED ORDER — HYDROCHLOROTHIAZIDE 25 MG PO TABS: 25.0000 mg | ORAL_TABLET | Freq: Every day | ORAL | Status: DC

## 2014-08-11 MED ORDER — LEVOTHYROXINE SODIUM 137 MCG PO TABS: 137.0000 ug | ORAL_TABLET | Freq: Every day | ORAL | Status: DC

## 2014-08-11 MED ORDER — VENLAFAXINE HCL ER 150 MG PO CP24: ORAL_CAPSULE | ORAL | Status: DC

## 2014-08-11 NOTE — Telephone Encounter (Signed)
Spoke with patient; letter issued/picked up by patient 08/11/14.

## 2014-08-11 NOTE — Progress Notes (Signed)
   Subjective:    Patient ID: Kimberly [name], female    DOB: [birth year], 64 y.o.   MRN: [id number]  Diabetes She presents for her follow-up diabetic visit. She has type 2 diabetes mellitus. She is compliant with treatment all of the time. Exercise: trying to walk but legs hurt. Home blood sugar record trend: pt does not check blood sugar. She does not see a podiatrist.Eye exam is current.  A1C was 6.3 on 06/02/14 bloodwork.   Muscle cramps in legs and arms. Started 2 -3 weeks ago.  Fatigue. Started 2 - 3 weeks ago.  Results for orders placed or performed in visit on 06/02/14  Lipid panel  Result Value Ref Range   Cholesterol 134 0 - 200 mg/dL   Triglycerides 201 (H) <150 mg/dL   HDL 36 (L) >39 mg/dL   Total CHOL/HDL Ratio 3.7 Ratio   VLDL 40 0 - 40 mg/dL   LDL Cholesterol 58 0 - 99 mg/dL  Hepatic function panel  Result Value Ref Range   Total Bilirubin 0.4 0.2 - 1.2 mg/dL   Bilirubin, Direct 0.1 0.0 - 0.3 mg/dL   Indirect Bilirubin 0.3 0.2 - 1.2 mg/dL   Alkaline Phosphatase 76 39 - 117 U/L   AST 27 0 - 37 U/L   ALT 48 (H) 0 - 35 U/L   Total Protein 7.3 6.0 - 8.3 g/dL   Albumin 4.2 3.5 - 5.2 g/dL  Basic metabolic panel  Result Value Ref Range   Sodium 139 135 - 145 mEq/L   Potassium 4.3 3.5 - 5.3 mEq/L   Chloride 106 96 - 112 mEq/L   CO2 25 19 - 32 mEq/L   Glucose, Bld 151 (H) 70 - 99 mg/dL   BUN 28 (H) 6 - 23 mg/dL   Creat 0.91 0.50 - 1.10 mg/dL   Calcium 9.2 8.4 - 10.5 mg/dL  TSH  Result Value Ref Range   TSH 0.177 (L) 0.350 - 4.500 uIU/mL  Microalbumin, urine  Result Value Ref Range   Microalb, Ur 1.1 <2.0 mg/dL  POCT glycosylated hemoglobin (Hb A1C)  Result Value Ref Range   Hemoglobin A1C 6.3    Feeling a little better with bananas  Sugars are not being checked Needs 90 day refills on all meds.   Compliant with blood pressure medication. Including new diuretic. Concerned about her potassium with excessive muscle cramps. Next  Also concerned that most the  time her blood pressure is in the 140/90 range.  Review of Systems No headache no chest pain and back pain no abdominal pain no change in bowel habits ROS otherwise negative    Objective:   Physical Exam  Alert vitals stable BP 140/88 on repeat. Review shows systolic and diastolic generally in this region. On other visits. HEENT normal. Lungs clear. Heart regular rate and rhythm. C diabetic foot exam.      Assessment & Plan:  Impression 1 type 2 diabetes good control discussed #2 hypertension suboptimal in discussed #3 leg cramps and fatigue. Question potassium response to Dyazide discussed plan magnesium and potassium level. Diet exercise discussed. Add verapamil 180 SR. Maintain other medications. Refills given. Follow-up as scheduled. WSL

## 2014-08-12 DIAGNOSIS — E119 Type 2 diabetes mellitus without complications: Secondary | ICD-10-CM | POA: Insufficient documentation

## 2014-08-12 LAB — BASIC METABOLIC PANEL
BUN: 23 mg/dL (ref 6–23)
CHLORIDE: 102 meq/L (ref 96–112)
CO2: 27 meq/L (ref 19–32)
Calcium: 9.3 mg/dL (ref 8.4–10.5)
Creat: 1.06 mg/dL (ref 0.50–1.10)
GLUCOSE: 146 mg/dL — AB (ref 70–99)
Potassium: 4.3 mEq/L (ref 3.5–5.3)
SODIUM: 138 meq/L (ref 135–145)

## 2014-08-12 LAB — MAGNESIUM: Magnesium: 1.6 mg/dL (ref 1.5–2.5)

## 2014-08-17 ENCOUNTER — Telehealth: Payer: Self-pay | Admitting: Orthopedic Surgery

## 2014-08-17 NOTE — Telephone Encounter (Addendum)
Regarding in-patient/admit surgery scheduled 09/09/2013 at [facility], contacted insurer, [facility] per on-line portal, in regard to CPT [phone], ICD-10 code, M12.9.  Per on-line submission, a representative will contact our office.  Ph# [phone] * * Received call back with Approval/Pre-Authorization# [id number], for a 2-day stay, 09/09/14-09/11/14, per Esau Grew.  If additional day(s) needed, hospital will be contacted to provide clinicals.

## 2014-09-03 NOTE — Patient Instructions (Signed)
KAIREE KOZMA  09/03/2014   Your procedure is scheduled on:   09/09/2014  Report to [facility] at  700  AM.  Call this number if you have problems the morning of surgery: [phone]   Remember:   Do not eat food or drink liquids after midnight.   Take these medicines the morning of surgery with A SIP OF WATER: hydrocodone, levothyroxine, losartan, metoprolol, protonix, effexor   Do not wear jewelry, make-up or nail polish.  Do not wear lotions, powders, or perfumes.  Do not shave 48 hours prior to surgery. Men may shave face and neck.  Do not bring valuables to the hospital.  [facility] is not responsible for any belongings or valuables.               Contacts, dentures or bridgework may not be worn into surgery.  Leave suitcase in the car. After surgery it may be brought to your room.  For patients admitted to the hospital, discharge time is determined by your treatment team.               Patients discharged the day of surgery will not be allowed to drive home.  Name and phone number of your driver: family  Special Instructions: Shower using CHG 2 nights before surgery and the night before surgery.  If you shower the day of surgery use CHG.  Use special wash - you have one bottle of CHG for all showers.  You should use approximately 1/3 of the bottle for each shower.   Please read over the following fact sheets that you were given: Pain Booklet, Coughing and Deep Breathing, Blood Transfusion Information, Total Joint Packet, MRSA Information, Surgical Site Infection Prevention, Anesthesia Post-op Instructions and Care and Recovery After Surgery Total Knee Replacement Total knee replacement is a procedure to replace your knee joint with an artificial knee joint (prosthetic knee joint). The purpose of this surgery is to reduce pain and improve your knee function. LET [facility] CARE PROVIDER KNOW ABOUT:   Any allergies you have.  All medicines you are taking, including  vitamins, herbs, eye drops, creams, and over-the-counter medicines.  Previous problems you or members of your family have had with the use of anesthetics.  Any blood disorders you have.  Previous surgeries you have had.  Medical conditions you have. RISKS AND COMPLICATIONS  Generally, total knee replacement is a safe procedure. However, problems can occur and include:  Loss of range of motion of the knee or instability.  Loosening of the prosthesis.  Infection.  Persistent pain. BEFORE THE PROCEDURE   Do not eat or drink anything after midnight on the night before the procedure or as directed by your health care provider.  Ask your health care provider about changing or stopping your regular medicines. This is especially important if you are taking diabetes medicines or blood thinners. PROCEDURE   Just before the procedure, you will receive medicine that will make you drowsy (sedative). This will be given through a tube that is inserted into one of your veins (IV tube).  Then you will be given one of the following:  A medicine injected into your spine that numbs your body below the waist (spinal anesthetic).  A medicine that makes you fall asleep (general anesthetic).  You may also receive medicine to block feeling in your leg (nerve block) to help ease pain after surgery.  An incision will be made in your knee.  Your surgeon will take out any damaged cartilage and bone by sawing off the damaged surfaces.  The surgeon will then put a new metal liner over the sawed-off portion of your thigh bone (femur) and a plastic liner over the sawed-off portion of one of the bones of your lower leg (tibia). This is to restore alignment and function to your knee. A plastic piece is often used to restore the surface of your knee cap. AFTER THE PROCEDURE   You will be taken to the recovery area.  You may have drainage tubes to drain excess fluid from your knee. These tubes attach to a  device that removes these fluids.  Once you are awake, stable, and taking fluids well, you will be taken to your hospital room.  You will receive physical therapy as prescribed by your health care provider.  Your surgeon may recommend that you spend time (usually an additional 10-14 days) in an extended-care facility to help you begin walking again and improve your range of motion before you go home.  You may also be prescribed blood-thinning medicine to decrease your risk of developing blood clots in your leg. Document Released: 11/20/2000 Document Revised: 12/29/2013 Document Reviewed: 09/24/2011 [facility] Patient Information 2015 [place], [place]. This information is not intended to replace advice given to you by your health care provider. Make sure you discuss any questions you have with your health care provider. Spinal Anesthesia Care After HOME CARE INSTRUCTIONS  Do not drive or operate machinery for at least 24 hours after receiving anesthesia. Make sure someone is available to drive you home.  Do not drink alcohol for at least 24 hours after receiving anesthesia.  Do not make important decisions for 24 hours after having spinal or epidural anesthesia. Your thinking may be unclear.  Have someone stay with you for at least 24 to 48 hours following surgery.  Drink lots of fluids when you get home. If you are an adult, drink eight, 8 ounce glasses of water per day, or as directed.  Keep your post-operative appointments as suggested. SEEK IMMEDIATE MEDICAL CARE IF:   You develop a fever or any temperature over 100.4 F (38 C).  You have a persistent or severe headache.  You develop blurred or double vision.  You develop dizziness, fainting or lightheadedness.  You have weakness, numbness, or tingling in your arms or legs.  You develop a skin rash.  You have difficulty breathing.  You have persistent nausea and vomiting.  You are unable to pass urine. Document Released:  11/04/2003 Document Revised: 11/06/2011 Document Reviewed: 09/17/2007 [facility] Patient Information 2015 [place], [place]. This information is not intended to replace advice given to you by your health care provider. Make sure you discuss any questions you have with your health care provider. PATIENT INSTRUCTIONS POST-ANESTHESIA  IMMEDIATELY FOLLOWING SURGERY:  Do not drive or operate machinery for the first twenty four hours after surgery.  Do not make any important decisions for twenty four hours after surgery or while taking narcotic pain medications or sedatives.  If you develop intractable nausea and vomiting or a severe headache please notify your doctor immediately.  FOLLOW-UP:  Please make an appointment with your surgeon as instructed. You do not need to follow up with anesthesia unless specifically instructed to do so.  WOUND CARE INSTRUCTIONS (if applicable):  Keep a dry clean dressing on the anesthesia/puncture wound site if there is drainage.  Once the wound has quit draining you may leave it open to air.  Generally you should leave the bandage intact for twenty four hours unless there is drainage.  If the epidural site drains for more than 36-48 hours please call the anesthesia department.  QUESTIONS?:  Please feel free to call your physician or the hospital operator if you have any questions, and they will be happy to assist you.

## 2014-09-04 ENCOUNTER — Encounter (HOSPITAL_COMMUNITY): Payer: Self-pay

## 2014-09-04 ENCOUNTER — Encounter (HOSPITAL_COMMUNITY)
Admission: RE | Admit: 2014-09-04 | Discharge: 2014-09-04 | Disposition: A | Discharge status: Home / Self Care | Payer: 59 | Source: Ambulatory Visit | Attending: Orthopedic Surgery | Admitting: Orthopedic Surgery

## 2014-09-04 DIAGNOSIS — Z01812 Encounter for preprocedural laboratory examination: Secondary | ICD-10-CM | POA: Diagnosis not present

## 2014-09-04 LAB — SURGICAL PCR SCREEN
MRSA, PCR: NEGATIVE
Staphylococcus aureus: NEGATIVE

## 2014-09-04 LAB — BASIC METABOLIC PANEL
Anion gap: 7 (ref 5–15)
BUN: 27 mg/dL — ABNORMAL HIGH (ref 6–23)
CO2: 29 mmol/L (ref 19–32)
Calcium: 9.2 mg/dL (ref 8.4–10.5)
Chloride: 99 mEq/L (ref 96–112)
Creatinine, Ser: 0.91 mg/dL (ref 0.50–1.10)
GFR, EST AFRICAN AMERICAN: 77 mL/min — AB (ref >90)
GFR, EST NON AFRICAN AMERICAN: 66 mL/min — AB (ref >90)
Glucose, Bld: 164 mg/dL — ABNORMAL HIGH (ref 70–99)
Potassium: 3.9 mmol/L (ref 3.5–5.1)
SODIUM: 135 mmol/L (ref 135–145)

## 2014-09-04 LAB — APTT: aPTT: 33 seconds (ref 24–37)

## 2014-09-04 LAB — PROTIME-INR
INR: 1.04 (ref 0.00–1.49)
PROTHROMBIN TIME: 13.7 s (ref 11.6–15.2)

## 2014-09-04 NOTE — Pre-Procedure Instructions (Signed)
Patient given information to sign up for my chart at home. 

## 2014-09-08 LAB — PREPARE RBC (CROSSMATCH)

## 2014-09-08 MED ORDER — TRANEXAMIC ACID 100 MG/ML IV SOLN
1000.0000 mg | INTRAVENOUS | Status: AC
  Administered 2014-09-09: 1000 mg via INTRAVENOUS
  Filled 2014-09-08: qty 10

## 2014-09-08 NOTE — H&P (Signed)
TOTAL KNEE ADMISSION H&P  Patient is being admitted for left total knee arthroplasty.  Subjective:  Chief Complaint:left knee pain.  HPI: Kimberly [name], 64 y.o. female, has a history of pain and functional disability in the left knee due to arthritis and has failed non-surgical conservative treatments for greater than 12 weeks to includeNSAID's and/or analgesics and activity modification.  Onset of symptoms was gradual, starting Several years ago with gradually worsening course since that time. The patient noted no past surgery on the left knee(s).  Patient currently rates pain in the left knee(s) at 7 out of 10 with activity. Patient has night pain, worsening of pain with activity and weight bearing, pain that interferes with activities of daily living, pain with passive range of motion, crepitus and joint swelling.  Patient has evidence of subchondral cysts, subchondral sclerosis, periarticular osteophytes and joint space narrowing by imaging studies.   Patient Active Problem List   Diagnosis Date Noted  . Diabetes 08/12/2014  . Esophageal reflux 06/02/2014  . Gastritis and gastroduodenitis 06/02/2014  . S/P total knee replacement 10/28/2013  . Stiffness of right knee 09/17/2013  . S/P knee replacement 09/02/2013  . Unspecified constipation 09/02/2013  . Bilateral knee pain 08/07/2013  . Microproteinuria 07/16/2013  . Essential hypertension, benign 07/06/2013  . Hypothyroidism 07/06/2013  . Medial meniscus, posterior horn derangement 01/19/2011  . OA (osteoarthritis) of knee 01/19/2011  . 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 Diabetes mellitus   . High blood pressure   . Hypothyroid   . Arthritis   . Obesity   . Depression   . PONV (postoperative nausea and vomiting)     Past Surgical History  Procedure Laterality Date  . Carpal tunnel release Right   . Thyroid surgery      removal  . Abdominal hysterectomy    . Bunions Right   . Knee  arthroscopy Right   . Total knee arthroplasty Right 08/25/2013    Procedure: TOTAL KNEE ARTHROPLASTY;  Surgeon: Carole Civil, MD;  Location: [facility];  Service: Orthopedics;  Laterality: Right;    No prescriptions prior to admission   Allergies  Allergen Reactions  . Ace Inhibitors     Induced cough    History  Substance Use Topics  . Smoking status: Never Smoker   . Smokeless tobacco: Not on file  . Alcohol Use: No    Family History  Problem Relation Age of Onset  . Cancer    . Diabetes    . Congestive Heart Failure Father      Review of Systems  All other systems reviewed and are negative.   Objective:  Physical Exam  Last recorded vital signs in our office were stable  General appearance normal appearance grooming and hygiene  Orientation  person place and time normal  Mood and affect  pleasant and flat affect  Gait and station  total knee gait on the right painful gait on the left  Cardiovascular no significant peripheral edema normal perfusion color temperature and pulses throughout  Lymph system negative upper lower extremities  Sensation normal all 4 extremities  Deep tendon and pathologic reflexes exam  symmetric and 2+ without pathologic reflexes all extremities  Coordination and balance normal  Extremities/Skin (4) upper extremities normal right knee skin incision is clean dry and intact and healed knee flexion ARC 115 knee stable motor exam is normal no effusion no tenderness  Left knee Joint line tenderness is noted small  effusion is noted. Decreased range of motion painful range of motion crepitance without and stability strength normal skin intact without any erythema   Vital signs in last 24 hours:    Labs:   Estimated body mass index is 34.83 kg/(m^2) as calculated from the following:   Height as of 07/28/14: 5\' 4"  (1.626 m).   Weight as of 07/28/14: 203 lb (92.08 kg).   Imaging Review Plain radiographs demonstrate moderate  degenerative joint disease of the left knee(s). The overall alignment isModerate varus. The bone quality appears to be good for age and reported activity level.  Assessment/Plan:  End stage arthritis, left knee   The patient history, physical examination, clinical judgment of the provider and imaging studies are consistent with end stage degenerative joint disease of the left knee(s) and left total knee arthroplasty is deemed medically necessary. The treatment options including medical management, injection therapy arthroscopy and arthroplasty were discussed at length. The risks and benefits of total knee arthroplasty were presented and reviewed. The risks due to aseptic loosening, infection, stiffness, patella tracking problems, thromboembolic complications and other imponderables were discussed. The patient acknowledged the explanation, agreed to proceed with the plan and consent was signed. Patient is being admitted for inpatient treatment for surgery, pain control, PT, OT, prophylactic antibiotics, VTE prophylaxis, progressive ambulation and ADL's and discharge planning. The patient is planning to be discharged home with home health services

## 2014-09-09 ENCOUNTER — Inpatient Hospital Stay (HOSPITAL_COMMUNITY): Payer: 59 | Admitting: Anesthesiology

## 2014-09-09 ENCOUNTER — Encounter (HOSPITAL_COMMUNITY): Payer: Self-pay | Admitting: *Deleted

## 2014-09-09 ENCOUNTER — Inpatient Hospital Stay (HOSPITAL_COMMUNITY): Payer: 59

## 2014-09-09 ENCOUNTER — Inpatient Hospital Stay (HOSPITAL_COMMUNITY)
Admission: RE | Admit: 2014-09-09 | Discharge: 2014-09-11 | DRG: 470 | Disposition: A | Discharge status: Home Health | Payer: 59 | Source: Ambulatory Visit | Attending: Orthopedic Surgery | Admitting: Orthopedic Surgery

## 2014-09-09 ENCOUNTER — Encounter (HOSPITAL_COMMUNITY)
Admission: RE | Disposition: A | Discharge status: Home Health | Payer: Self-pay | Source: Ambulatory Visit | Attending: Orthopedic Surgery

## 2014-09-09 DIAGNOSIS — M1712 Unilateral primary osteoarthritis, left knee: Secondary | ICD-10-CM | POA: Diagnosis present

## 2014-09-09 DIAGNOSIS — E669 Obesity, unspecified: Secondary | ICD-10-CM | POA: Diagnosis present

## 2014-09-09 DIAGNOSIS — Z888 Allergy status to other drugs, medicaments and biological substances status: Secondary | ICD-10-CM | POA: Diagnosis not present

## 2014-09-09 DIAGNOSIS — I10 Essential (primary) hypertension: Secondary | ICD-10-CM | POA: Diagnosis present

## 2014-09-09 DIAGNOSIS — R51 Headache: Secondary | ICD-10-CM | POA: Diagnosis not present

## 2014-09-09 DIAGNOSIS — Z96651 Presence of right artificial knee joint: Secondary | ICD-10-CM | POA: Diagnosis present

## 2014-09-09 DIAGNOSIS — M238X2 Other internal derangements of left knee: Secondary | ICD-10-CM | POA: Diagnosis present

## 2014-09-09 DIAGNOSIS — K219 Gastro-esophageal reflux disease without esophagitis: Secondary | ICD-10-CM | POA: Diagnosis present

## 2014-09-09 DIAGNOSIS — Z09 Encounter for follow-up examination after completed treatment for conditions other than malignant neoplasm: Secondary | ICD-10-CM

## 2014-09-09 DIAGNOSIS — M25862 Other specified joint disorders, left knee: Secondary | ICD-10-CM | POA: Diagnosis present

## 2014-09-09 DIAGNOSIS — M179 Osteoarthritis of knee, unspecified: Secondary | ICD-10-CM | POA: Diagnosis present

## 2014-09-09 DIAGNOSIS — E119 Type 2 diabetes mellitus without complications: Secondary | ICD-10-CM | POA: Diagnosis present

## 2014-09-09 DIAGNOSIS — M25762 Osteophyte, left knee: Secondary | ICD-10-CM | POA: Diagnosis present

## 2014-09-09 DIAGNOSIS — Z6838 Body mass index (BMI) 38.0-38.9, adult: Secondary | ICD-10-CM

## 2014-09-09 DIAGNOSIS — F329 Major depressive disorder, single episode, unspecified: Secondary | ICD-10-CM | POA: Diagnosis present

## 2014-09-09 DIAGNOSIS — Z90710 Acquired absence of both cervix and uterus: Secondary | ICD-10-CM

## 2014-09-09 DIAGNOSIS — E039 Hypothyroidism, unspecified: Secondary | ICD-10-CM | POA: Diagnosis present

## 2014-09-09 DIAGNOSIS — M1710 Unilateral primary osteoarthritis, unspecified knee: Secondary | ICD-10-CM | POA: Diagnosis present

## 2014-09-09 HISTORY — PX: TOTAL KNEE ARTHROPLASTY: SHX125

## 2014-09-09 LAB — CBC WITH DIFFERENTIAL/PLATELET
BASOS ABS: 0.1 10*3/uL (ref 0.0–0.1)
Basophils Relative: 1 % (ref 0–1)
Eosinophils Absolute: 0.2 10*3/uL (ref 0.0–0.7)
Eosinophils Relative: 3 % (ref 0–5)
HCT: 34.4 % — ABNORMAL LOW (ref 36.0–46.0)
Hemoglobin: 11.7 g/dL — ABNORMAL LOW (ref 12.0–15.0)
Lymphocytes Relative: 31 % (ref 12–46)
Lymphs Abs: 2.3 10*3/uL (ref 0.7–4.0)
MCH: 33.3 pg (ref 26.0–34.0)
MCHC: 34 g/dL (ref 30.0–36.0)
MCV: 98 fL (ref 78.0–100.0)
MONO ABS: 0.6 10*3/uL (ref 0.1–1.0)
Monocytes Relative: 8 % (ref 3–12)
Neutro Abs: 4.4 10*3/uL (ref 1.7–7.7)
Neutrophils Relative %: 57 % (ref 43–77)
Platelets: 311 10*3/uL (ref 150–400)
RBC: 3.51 MIL/uL — ABNORMAL LOW (ref 3.87–5.11)
RDW: 12.7 % (ref 11.5–15.5)
WBC: 7.5 10*3/uL (ref 4.0–10.5)

## 2014-09-09 LAB — GLUCOSE, CAPILLARY
GLUCOSE-CAPILLARY: 180 mg/dL — AB (ref 70–99)
Glucose-Capillary: 158 mg/dL — ABNORMAL HIGH (ref 70–99)

## 2014-09-09 SURGERY — ARTHROPLASTY, KNEE, TOTAL
Anesthesia: Spinal | Site: Knee | Laterality: Left

## 2014-09-09 MED ORDER — MENTHOL 3 MG MT LOZG: 1.0000 | LOZENGE | OROMUCOSAL | Status: DC | PRN

## 2014-09-09 MED ORDER — FENTANYL CITRATE 0.05 MG/ML IJ SOLN
INTRAMUSCULAR | Status: DC | PRN
  Administered 2014-09-09: 25 ug via INTRATHECAL
  Administered 2014-09-09: 25 ug via INTRAVENOUS

## 2014-09-09 MED ORDER — CEFAZOLIN SODIUM-DEXTROSE 2-3 GM-% IV SOLR
2.0000 g | INTRAVENOUS | Status: AC
  Administered 2014-09-09: 2 g via INTRAVENOUS
  Filled 2014-09-09: qty 50

## 2014-09-09 MED ORDER — CEFAZOLIN SODIUM-DEXTROSE 2-3 GM-% IV SOLR
2.0000 g | Freq: Four times a day (QID) | INTRAVENOUS | Status: AC
  Administered 2014-09-09 (×2): 2 g via INTRAVENOUS
  Filled 2014-09-09 (×2): qty 50

## 2014-09-09 MED ORDER — LOSARTAN POTASSIUM 50 MG PO TABS
25.0000 mg | ORAL_TABLET | Freq: Every day | ORAL | Status: DC
  Administered 2014-09-10 – 2014-09-11 (×2): 25 mg via ORAL
  Filled 2014-09-09 (×2): qty 1

## 2014-09-09 MED ORDER — METOCLOPRAMIDE HCL 10 MG PO TABS: 5.0000 mg | ORAL_TABLET | Freq: Three times a day (TID) | ORAL | Status: DC | PRN

## 2014-09-09 MED ORDER — OXYCODONE HCL 5 MG PO TABS
5.0000 mg | ORAL_TABLET | Freq: Once | ORAL | Status: AC
  Administered 2014-09-09: 5 mg via ORAL
  Filled 2014-09-09: qty 1

## 2014-09-09 MED ORDER — METOCLOPRAMIDE HCL 5 MG/ML IJ SOLN: 5.0000 mg | Freq: Three times a day (TID) | INTRAMUSCULAR | Status: DC | PRN

## 2014-09-09 MED ORDER — METOPROLOL SUCCINATE ER 50 MG PO TB24
50.0000 mg | ORAL_TABLET | Freq: Every day | ORAL | Status: DC
  Administered 2014-09-09 – 2014-09-10 (×2): 50 mg via ORAL
  Filled 2014-09-09 (×2): qty 1

## 2014-09-09 MED ORDER — OXYCODONE HCL 5 MG PO TABS
5.0000 mg | ORAL_TABLET | ORAL | Status: DC | PRN
  Administered 2014-09-09 – 2014-09-11 (×2): 10 mg via ORAL
  Filled 2014-09-09 (×2): qty 2

## 2014-09-09 MED ORDER — EPHEDRINE SULFATE 50 MG/ML IJ SOLN
INTRAMUSCULAR | Status: DC | PRN
  Administered 2014-09-09: 10 mg via INTRAVENOUS
  Administered 2014-09-09: 15 mg via INTRAVENOUS

## 2014-09-09 MED ORDER — HYDROCHLOROTHIAZIDE 25 MG PO TABS
25.0000 mg | ORAL_TABLET | Freq: Every day | ORAL | Status: DC
  Administered 2014-09-09 – 2014-09-11 (×3): 25 mg via ORAL
  Filled 2014-09-09 (×3): qty 1

## 2014-09-09 MED ORDER — METFORMIN HCL 500 MG PO TABS
500.0000 mg | ORAL_TABLET | Freq: Two times a day (BID) | ORAL | Status: DC
  Administered 2014-09-09 – 2014-09-11 (×4): 500 mg via ORAL
  Filled 2014-09-09 (×5): qty 1

## 2014-09-09 MED ORDER — DIPHENHYDRAMINE HCL 12.5 MG/5ML PO ELIX: 12.5000 mg | ORAL_SOLUTION | ORAL | Status: DC | PRN

## 2014-09-09 MED ORDER — CHLORHEXIDINE GLUCONATE 4 % EX LIQD: 60.0000 mL | Freq: Once | CUTANEOUS | Status: DC

## 2014-09-09 MED ORDER — SODIUM CHLORIDE 0.9 % IJ SOLN
INTRAMUSCULAR | Status: AC
  Filled 2014-09-09: qty 40

## 2014-09-09 MED ORDER — SODIUM CHLORIDE 0.9 % IV SOLN
INTRAVENOUS | Status: DC
  Administered 2014-09-09 – 2014-09-10 (×2): via INTRAVENOUS

## 2014-09-09 MED ORDER — DEXAMETHASONE SODIUM PHOSPHATE 4 MG/ML IJ SOLN
INTRAMUSCULAR | Status: AC
  Filled 2014-09-09: qty 1

## 2014-09-09 MED ORDER — ONDANSETRON HCL 4 MG/2ML IJ SOLN: 4.0000 mg | Freq: Once | INTRAMUSCULAR | Status: DC | PRN

## 2014-09-09 MED ORDER — PREGABALIN 50 MG PO CAPS
50.0000 mg | ORAL_CAPSULE | Freq: Once | ORAL | Status: AC
  Administered 2014-09-09: 50 mg via ORAL
  Filled 2014-09-09: qty 1

## 2014-09-09 MED ORDER — METHOCARBAMOL 1000 MG/10ML IJ SOLN
500.0000 mg | Freq: Once | INTRAVENOUS | Status: AC
  Administered 2014-09-09: 500 mg via INTRAVENOUS
  Filled 2014-09-09: qty 5

## 2014-09-09 MED ORDER — SCOPOLAMINE 1 MG/3DAYS TD PT72
MEDICATED_PATCH | TRANSDERMAL | Status: AC
  Filled 2014-09-09: qty 1

## 2014-09-09 MED ORDER — ONDANSETRON HCL 4 MG/2ML IJ SOLN
4.0000 mg | Freq: Once | INTRAMUSCULAR | Status: AC
  Administered 2014-09-09: 4 mg via INTRAVENOUS

## 2014-09-09 MED ORDER — PHENOL 1.4 % MT LIQD: 1.0000 | OROMUCOSAL | Status: DC | PRN

## 2014-09-09 MED ORDER — SODIUM CHLORIDE 0.9 % IR SOLN
Status: DC | PRN
  Administered 2014-09-09: 3000 mL
  Administered 2014-09-09: 1000 mL

## 2014-09-09 MED ORDER — EPHEDRINE SULFATE 50 MG/ML IJ SOLN
INTRAMUSCULAR | Status: AC
  Filled 2014-09-09: qty 1

## 2014-09-09 MED ORDER — BUPIVACAINE LIPOSOME 1.3 % IJ SUSP
INTRAMUSCULAR | Status: AC
  Filled 2014-09-09: qty 20

## 2014-09-09 MED ORDER — ONDANSETRON HCL 4 MG/2ML IJ SOLN: 4.0000 mg | Freq: Four times a day (QID) | INTRAMUSCULAR | Status: DC | PRN

## 2014-09-09 MED ORDER — IBUPROFEN 600 MG PO TABS
600.0000 mg | ORAL_TABLET | Freq: Four times a day (QID) | ORAL | Status: DC | PRN
  Administered 2014-09-09: 600 mg via ORAL
  Filled 2014-09-09: qty 1

## 2014-09-09 MED ORDER — BUPIVACAINE-EPINEPHRINE (PF) 0.5% -1:200000 IJ SOLN
INTRAMUSCULAR | Status: DC | PRN
  Administered 2014-09-09: 30 mL

## 2014-09-09 MED ORDER — PROPOFOL INFUSION 10 MG/ML OPTIME
INTRAVENOUS | Status: DC | PRN
  Administered 2014-09-09: 10:00:00 via INTRAVENOUS
  Administered 2014-09-09: 35 ug/kg/min via INTRAVENOUS
  Administered 2014-09-09: 09:00:00 via INTRAVENOUS

## 2014-09-09 MED ORDER — HYDROMORPHONE HCL 1 MG/ML IJ SOLN
0.5000 mg | INTRAMUSCULAR | Status: DC | PRN
  Administered 2014-09-09 – 2014-09-10 (×2): 0.5 mg via INTRAVENOUS
  Filled 2014-09-09 (×3): qty 1

## 2014-09-09 MED ORDER — DOCUSATE SODIUM 100 MG PO CAPS
100.0000 mg | ORAL_CAPSULE | Freq: Two times a day (BID) | ORAL | Status: DC
  Administered 2014-09-09 – 2014-09-11 (×5): 100 mg via ORAL
  Filled 2014-09-09 (×5): qty 1

## 2014-09-09 MED ORDER — METHOCARBAMOL 500 MG PO TABS
500.0000 mg | ORAL_TABLET | Freq: Four times a day (QID) | ORAL | Status: DC | PRN
  Administered 2014-09-09: 500 mg via ORAL
  Filled 2014-09-09: qty 1

## 2014-09-09 MED ORDER — PROPOFOL 10 MG/ML IV BOLUS
INTRAVENOUS | Status: AC
  Filled 2014-09-09: qty 20

## 2014-09-09 MED ORDER — MIDAZOLAM HCL 5 MG/5ML IJ SOLN
INTRAMUSCULAR | Status: DC | PRN
  Administered 2014-09-09: 2 mg via INTRAVENOUS

## 2014-09-09 MED ORDER — ALUM & MAG HYDROXIDE-SIMETH 200-200-20 MG/5ML PO SUSP: 30.0000 mL | ORAL | Status: DC | PRN

## 2014-09-09 MED ORDER — DEXAMETHASONE SODIUM PHOSPHATE 4 MG/ML IJ SOLN
4.0000 mg | Freq: Once | INTRAMUSCULAR | Status: AC
  Administered 2014-09-09: 4 mg via INTRAVENOUS

## 2014-09-09 MED ORDER — FENTANYL CITRATE 0.05 MG/ML IJ SOLN
INTRAMUSCULAR | Status: AC
  Filled 2014-09-09: qty 2

## 2014-09-09 MED ORDER — BUPIVACAINE-EPINEPHRINE (PF) 0.5% -1:200000 IJ SOLN
INTRAMUSCULAR | Status: AC
  Filled 2014-09-09: qty 30

## 2014-09-09 MED ORDER — FENTANYL CITRATE 0.05 MG/ML IJ SOLN: 25.0000 ug | INTRAMUSCULAR | Status: DC | PRN

## 2014-09-09 MED ORDER — BUPIVACAINE LIPOSOME 1.3 % IJ SUSP
INTRAMUSCULAR | Status: DC | PRN
  Administered 2014-09-09: 60 mL

## 2014-09-09 MED ORDER — ONDANSETRON HCL 4 MG/2ML IJ SOLN
INTRAMUSCULAR | Status: AC
  Filled 2014-09-09: qty 2

## 2014-09-09 MED ORDER — ACETAMINOPHEN 650 MG RE SUPP: 650.0000 mg | Freq: Four times a day (QID) | RECTAL | Status: DC | PRN

## 2014-09-09 MED ORDER — ACETAMINOPHEN 500 MG PO TABS
500.0000 mg | ORAL_TABLET | Freq: Once | ORAL | Status: AC
Start: 2014-09-09 — End: 2014-09-09
  Administered 2014-09-09: 500 mg via ORAL
  Filled 2014-09-09: qty 1

## 2014-09-09 MED ORDER — MIDAZOLAM HCL 2 MG/2ML IJ SOLN
1.0000 mg | INTRAMUSCULAR | Status: DC | PRN
  Administered 2014-09-09: 2 mg via INTRAVENOUS

## 2014-09-09 MED ORDER — PANTOPRAZOLE SODIUM 40 MG PO TBEC
40.0000 mg | DELAYED_RELEASE_TABLET | Freq: Every day | ORAL | Status: DC
  Administered 2014-09-10 – 2014-09-11 (×2): 40 mg via ORAL
  Filled 2014-09-09 (×2): qty 1

## 2014-09-09 MED ORDER — BUPIVACAINE IN DEXTROSE 0.75-8.25 % IT SOLN
INTRATHECAL | Status: AC
  Filled 2014-09-09: qty 2

## 2014-09-09 MED ORDER — FENTANYL CITRATE 0.05 MG/ML IJ SOLN
25.0000 ug | INTRAMUSCULAR | Status: AC
  Administered 2014-09-09 (×2): 25 ug via INTRAVENOUS

## 2014-09-09 MED ORDER — ONDANSETRON HCL 4 MG/2ML IJ SOLN
4.0000 mg | Freq: Once | INTRAMUSCULAR | Status: AC
  Administered 2014-09-09: 4 mg via INTRAVENOUS
  Filled 2014-09-09: qty 2

## 2014-09-09 MED ORDER — LEVOTHYROXINE SODIUM 25 MCG PO TABS
137.0000 ug | ORAL_TABLET | Freq: Every day | ORAL | Status: DC
  Administered 2014-09-10 – 2014-09-11 (×2): 137 ug via ORAL
  Filled 2014-09-09 (×4): qty 1

## 2014-09-09 MED ORDER — MIDAZOLAM HCL 2 MG/2ML IJ SOLN
INTRAMUSCULAR | Status: AC
  Filled 2014-09-09: qty 2

## 2014-09-09 MED ORDER — SODIUM CHLORIDE 0.9 % IJ SOLN
INTRAMUSCULAR | Status: AC
  Filled 2014-09-09: qty 10

## 2014-09-09 MED ORDER — LACTATED RINGERS IV SOLN
INTRAVENOUS | Status: DC
  Administered 2014-09-09: 09:00:00 via INTRAVENOUS
  Administered 2014-09-09: 1000 mL via INTRAVENOUS

## 2014-09-09 MED ORDER — POLYETHYLENE GLYCOL 3350 17 G PO PACK
17.0000 g | PACK | Freq: Every day | ORAL | Status: DC
  Administered 2014-09-11: 17 g via ORAL
  Filled 2014-09-09 (×3): qty 1

## 2014-09-09 MED ORDER — SENNA 8.6 MG PO TABS
1.0000 | ORAL_TABLET | Freq: Two times a day (BID) | ORAL | Status: DC
  Administered 2014-09-09 – 2014-09-11 (×5): 8.6 mg via ORAL
  Filled 2014-09-09 (×5): qty 1

## 2014-09-09 MED ORDER — BUPIVACAINE IN DEXTROSE 0.75-8.25 % IT SOLN
INTRATHECAL | Status: DC | PRN
  Administered 2014-09-09: 15 mg via INTRATHECAL

## 2014-09-09 MED ORDER — ASPIRIN EC 325 MG PO TBEC
325.0000 mg | DELAYED_RELEASE_TABLET | Freq: Two times a day (BID) | ORAL | Status: DC
  Administered 2014-09-10 – 2014-09-11 (×3): 325 mg via ORAL
  Filled 2014-09-09 (×3): qty 1

## 2014-09-09 MED ORDER — HYDROCODONE-ACETAMINOPHEN 10-325 MG PO TABS
1.0000 | ORAL_TABLET | ORAL | Status: DC
  Administered 2014-09-09 – 2014-09-11 (×12): 1 via ORAL
  Filled 2014-09-09 (×12): qty 1

## 2014-09-09 MED ORDER — DEXTROSE 5 % IV SOLN
500.0000 mg | Freq: Four times a day (QID) | INTRAVENOUS | Status: DC | PRN
  Filled 2014-09-09: qty 5

## 2014-09-09 MED ORDER — SCOPOLAMINE 1 MG/3DAYS TD PT72
1.0000 | MEDICATED_PATCH | Freq: Once | TRANSDERMAL | Status: DC
  Administered 2014-09-09: 1.5 mg via TRANSDERMAL

## 2014-09-09 MED ORDER — ONDANSETRON HCL 4 MG PO TABS: 4.0000 mg | ORAL_TABLET | Freq: Four times a day (QID) | ORAL | Status: DC | PRN

## 2014-09-09 MED ORDER — ACETAMINOPHEN 325 MG PO TABS
650.0000 mg | ORAL_TABLET | Freq: Four times a day (QID) | ORAL | Status: DC | PRN
Start: 2014-09-09 — End: 2014-09-11

## 2014-09-09 MED ORDER — PHENYLEPHRINE HCL 10 MG/ML IJ SOLN
INTRAMUSCULAR | Status: AC
  Filled 2014-09-09: qty 1

## 2014-09-09 SURGICAL SUPPLY — 71 items
BAG HAMPER (MISCELLANEOUS) ×2 IMPLANT
BANDAGE ESMARK 6X9 LF (GAUZE/BANDAGES/DRESSINGS) ×1 IMPLANT
BIT DRILL 3.2X128 (BIT) ×2 IMPLANT
BLADE HEX COATED 2.75 (ELECTRODE) ×2 IMPLANT
BLADE SAG 18X100X1.27 (BLADE) IMPLANT
BLADE SAGITTAL 25.0X1.27X90 (BLADE) IMPLANT
BLADE SAW SAG 90X13X1.27 (BLADE) IMPLANT
BNDG ESMARK 6X9 LF (GAUZE/BANDAGES/DRESSINGS) ×2
BOWL SMART MIX CTS (DISPOSABLE) IMPLANT
CAP KNEE TOTAL 3 SIGMA ×2 IMPLANT
CEMENT HV SMART SET (Cement) ×4 IMPLANT
CLOTH BEACON ORANGE TIMEOUT ST (SAFETY) ×2 IMPLANT
COVER LIGHT HANDLE STERIS (MISCELLANEOUS) ×8 IMPLANT
COVER PROBE W GEL 5X96 (DRAPES) ×2 IMPLANT
CUFF TOURNIQUET SINGLE 34IN LL (TOURNIQUET CUFF) ×2 IMPLANT
CUFF TOURNIQUET SINGLE 44IN (TOURNIQUET CUFF) IMPLANT
DECANTER SPIKE VIAL GLASS SM (MISCELLANEOUS) ×2 IMPLANT
DRAPE BACK TABLE (DRAPES) ×2 IMPLANT
DRAPE EXTREMITY T 121X128X90 (DRAPE) ×2 IMPLANT
DRSG MEPILEX BORDER 4X12 (GAUZE/BANDAGES/DRESSINGS) ×2 IMPLANT
DURAPREP 26ML APPLICATOR (WOUND CARE) ×4 IMPLANT
ELECT REM PT RETURN 9FT ADLT (ELECTROSURGICAL) ×2
ELECTRODE REM PT RTRN 9FT ADLT (ELECTROSURGICAL) ×1 IMPLANT
EVACUATOR 3/16  PVC DRAIN (DRAIN) ×1
EVACUATOR 3/16 PVC DRAIN (DRAIN) ×1 IMPLANT
FACESHIELD LNG OPTICON STERILE (SAFETY) ×2 IMPLANT
GLOVE BIO SURGEON STRL SZ7.5 (GLOVE) ×2 IMPLANT
GLOVE BIOGEL PI IND STRL 7.0 (GLOVE) ×4 IMPLANT
GLOVE BIOGEL PI IND STRL 7.5 (GLOVE) ×2 IMPLANT
GLOVE BIOGEL PI INDICATOR 7.0 (GLOVE) ×4
GLOVE BIOGEL PI INDICATOR 7.5 (GLOVE) ×2
GLOVE ECLIPSE 6.5 STRL STRAW (GLOVE) ×4 IMPLANT
GLOVE OPTIFIT SS 8.0 STRL (GLOVE) ×2 IMPLANT
GLOVE SKINSENSE NS SZ8.0 LF (GLOVE) ×2
GLOVE SKINSENSE STRL SZ8.0 LF (GLOVE) ×2 IMPLANT
GLOVE SS N UNI LF 8.5 STRL (GLOVE) ×2 IMPLANT
GOWN STRL REUS W/TWL LRG LVL3 (GOWN DISPOSABLE) ×8 IMPLANT
GOWN STRL REUS W/TWL XL LVL3 (GOWN DISPOSABLE) ×2 IMPLANT
HANDPIECE INTERPULSE COAX TIP (DISPOSABLE) ×1
HOOD W/PEELAWAY (MISCELLANEOUS) ×8 IMPLANT
INST SET MAJOR BONE (KITS) ×2 IMPLANT
IV NS IRRIG 3000ML ARTHROMATIC (IV SOLUTION) ×2 IMPLANT
KIT BLADEGUARD II DBL (SET/KITS/TRAYS/PACK) ×2 IMPLANT
KIT ROOM TURNOVER APOR (KITS) ×2 IMPLANT
MANIFOLD NEPTUNE II (INSTRUMENTS) ×2 IMPLANT
MARKER SKIN DUAL TIP RULER LAB (MISCELLANEOUS) ×2 IMPLANT
NEEDLE HYPO 21X1.5 SAFETY (NEEDLE) ×4 IMPLANT
NEEDLE HYPO 25X1 1.5 SAFETY (NEEDLE) IMPLANT
NS IRRIG 1000ML POUR BTL (IV SOLUTION) ×4 IMPLANT
PACK TOTAL JOINT (CUSTOM PROCEDURE TRAY) ×2 IMPLANT
PAD ARMBOARD 7.5X6 YLW CONV (MISCELLANEOUS) ×2 IMPLANT
PAD DANNIFLEX CPM (ORTHOPEDIC SUPPLIES) ×2 IMPLANT
PIN TROCAR 3 INCH (PIN) ×2 IMPLANT
SAW OSC TIP CART 19.5X105X1.3 (SAW) ×2 IMPLANT
SET BASIN LINEN APH (SET/KITS/TRAYS/PACK) ×2 IMPLANT
SET HNDPC FAN SPRY TIP SCT (DISPOSABLE) ×1 IMPLANT
SPONGE DRAIN TRACH 4X4 STRL 2S (GAUZE/BANDAGES/DRESSINGS) IMPLANT
STAPLER VISISTAT 35W (STAPLE) ×2 IMPLANT
SUT BRALON NAB BRD #1 30IN (SUTURE) ×4 IMPLANT
SUT MNCRL 0 VIOLET CTX 36 (SUTURE) ×1 IMPLANT
SUT MON AB 0 CT1 (SUTURE) ×2 IMPLANT
SUT MON AB 2-0 CT1 36 (SUTURE) ×2 IMPLANT
SUT MONOCRYL 0 CTX 36 (SUTURE) ×1
SYR 20CC LL (SYRINGE) ×2 IMPLANT
SYR 30ML LL (SYRINGE) ×2 IMPLANT
SYR BULB IRRIGATION 50ML (SYRINGE) ×2 IMPLANT
TOWEL OR 17X26 4PK STRL BLUE (TOWEL DISPOSABLE) ×2 IMPLANT
TOWER CARTRIDGE SMART MIX (DISPOSABLE) ×2 IMPLANT
TRAY FOLEY CATH 16FR SILVER (SET/KITS/TRAYS/PACK) ×2 IMPLANT
WATER STERILE IRR 1000ML POUR (IV SOLUTION) ×4 IMPLANT
YANKAUER SUCT 12FT TUBE ARGYLE (SUCTIONS) ×2 IMPLANT

## 2014-09-09 NOTE — Interval H&P Note (Signed)
History and Physical Interval Note:  09/09/2014 8:08 AM  Kimberly [name]  has presented today for surgery, with the diagnosis of LEFT KNEE OSTEOARTHRITIS  The various methods of treatment have been discussed with the patient and family. After consideration of risks, benefits and other options for treatment, the patient has consented to  Procedure(s): TOTAL KNEE ARTHROPLASTY (Left) as a surgical intervention .  The patient's history has been reviewed, patient examined, no change in status, stable for surgery.  I have reviewed the patient's chart and labs.  Questions were answered to the patient's satisfaction.     Arther Abbott

## 2014-09-09 NOTE — Transfer of Care (Signed)
Immediate Anesthesia Transfer of Care Note  Patient: Kimberly [name]  Procedure(s) Performed: Procedure(s): LEFT TOTAL KNEE ARTHROPLASTY (Left)  Patient Location: PACU  Anesthesia Type:Spinal  Level of Consciousness: awake, alert  and patient cooperative  Airway & Oxygen Therapy: Patient Spontanous Breathing and Patient connected to face mask oxygen  Post-op Assessment: Report given to PACU RN and Post -op Vital signs reviewed and stable  Post vital signs: Reviewed and stable  Complications: No apparent anesthesia complications

## 2014-09-09 NOTE — Progress Notes (Signed)
Patient arrived to unit from PACU. Patient was alert and oriented and had no complaints. Patient was oriented to room. Patients VS were within normal limits.Patients dressing to the left knee was clean, dry, and intact. Patients bed was at the lowest level, call bell was within reach, and telephone was on bedside table. Will continue to monitor patient at this time.

## 2014-09-09 NOTE — Anesthesia Preprocedure Evaluation (Signed)
Anesthesia Evaluation  Patient identified by MRN, date of birth, ID band Patient awake    Reviewed: Allergy & Precautions, H&P , NPO status , Patient's Chart, lab work & pertinent test results  History of Anesthesia Complications (+) PONV and history of anesthetic complications  Airway Mallampati: II  TM Distance: >3 FB     Dental  (+) Teeth Intact, Implants   Pulmonary  breath sounds clear to auscultation        Cardiovascular hypertension, Pt. on medications Rhythm:Regular Rate:Normal     Neuro/Psych PSYCHIATRIC DISORDERS Depression  Neuromuscular disease    GI/Hepatic negative GI ROS, GERD-  Controlled and Medicated,  Endo/Other  diabetes, Well Controlled, Type 2, Oral Hypoglycemic AgentsHypothyroidism   Renal/GU      Musculoskeletal  (+) Arthritis -,   Abdominal   Peds  Hematology   Anesthesia Other Findings   Reproductive/Obstetrics                             Anesthesia Physical Anesthesia Plan  ASA: III  Anesthesia Plan: Spinal   Post-op Pain Management:    Induction:   Airway Management Planned: Nasal Cannula  Additional Equipment:   Intra-op Plan:   Post-operative Plan:   Informed Consent: I have reviewed the patients History and Physical, chart, labs and discussed the procedure including the risks, benefits and alternatives for the proposed anesthesia with the patient or authorized representative who has indicated his/her understanding and acceptance.     Plan Discussed with:   Anesthesia Plan Comments:         Anesthesia Quick Evaluation

## 2014-09-09 NOTE — Anesthesia Postprocedure Evaluation (Signed)
  Anesthesia Post-op Note  Patient: Kimberly [name]  Procedure(s) Performed: Procedure(s): LEFT TOTAL KNEE ARTHROPLASTY (Left)  Patient Location: PACU  Anesthesia Type:Spinal  Level of Consciousness: awake, alert , oriented and patient cooperative  Airway and Oxygen Therapy: Patient Spontanous Breathing  Post-op Pain: 3 /10, mild  Post-op Assessment: Post-op Vital signs reviewed, Patient's Cardiovascular Status Stable, Respiratory Function Stable, Patent Airway, NAUSEA AND VOMITING PRESENT and Pain level controlled  Post-op Vital Signs: Reviewed and stable  Last Vitals:  Filed Vitals:   09/09/14 0815  BP: 154/70  Temp:   Resp: 10    Complications: No apparent anesthesia complications

## 2014-09-09 NOTE — Op Note (Signed)
Surgical report for your 13 2016  Left total knee  Preop diagnosis osteoarthritis left knee   Postop diagnosis same   Procedure left total knee arthroplasty   Implants Depuy  Fixed bearing posterior stabilized SIGMA  IMPLANT WITH SIZE 3 FEMUR, SIZE 3 TIBIA, SIZED 41 PATELLA AND SIZE 10 POLYETHYLENE INSERT     Surgeon Dr. Aline Brochure  ASSISTED BY Simonne Maffucci and Barnetta Chapel Page   tourniquet time 82 minutes  Hemovac drain inserted and the joint   Marcaine with epinephrine 60 mL an Exparel 20 mL injected in the periarticular structures   Details of procedure  The patient was identified in the preop holding area her identification was confirmed using 2 approved identification markers. The surgical site was confirmed and marked as right knee.   The patient was taken to the operating room given the appropriate amount of Ancef based on her weight of 209 pounds. She has spinal anesthetic Foley catheter inserted. Left leg prepped with ChloraPrep and draped sterilely. Timeout completed. Limb exsanguinated with 6 cm marked tourniquet elevated to 300 mmHg.  The knee was placed in flexion midline incision was carried out. Incision was taken down to the extensor mechanism. Medial arthrotomy was performed. Patella was everted. Medial lateral meniscus was removed.    the joint surfaces the medial compartment showed no cartilage throughout the entire medial femoral condyle the medial meniscus was intact the tibial plateau had grade 3 changes as well. Patellofemoral joint was grade 2 lateral compartment showed minimal degenerative change.  Osteophytes were removed from the femur and tibia. Anterior cruciate ligament and PCL were resected.  A 3/8 inch drill bit was used to enter the femoral canal which was suctioned and irrigated until the fluid was clear. The femoral cutting guide was set for 11 mm distal cut 5 valgus for the left knee. This was inserted into the femur. This cutting block was pinned  in place and the distal femoral cut was made with an oscillating saw. Cut was checked for flatness.  We then measured the femur to a size 3-1/2 and pinned it at a size 3. The 3 cutting block was placed in the.  The tibia was subluxated forward. The tibial external alignment guide was placed removing 8 mm of bone from the higher lateral side with neutral position for sagittal and coronal plane alignment. This cutting block was pinned in place in the proximal tibia was resected. The medial side measured 3 mm a lateral side measured 11 mm.   The tibia was sized to a size 3.    next we used spacer blocks to confirm 10 mm insert with it and that the knee was balanced in extension and flexion  The  Size 3 notch cutting guide was placed in the notch was resected.   Trial reduction was performed with a size 3 femur size 3 tibia 10 mm insert and we got a good reduction with excellent range of motion and no liftoff. The patella tracked normally. The patella measured 23 mm were resected down to 14 mm and replaced it with an 11 mm thickness 41 patellar button. Repeat range of motion showed no impingement for total patellar thickness of 25 edges within acceptable range.   tibia was then punched from a fracture technique. Cement was mixed on the back table.  The knee was irrigated the bone was  Dried and the implants were cemented in place. Excess cement was removed and cement was allowed to cure with the knee in  extension.  The knee was reduced again the Exparel and Marcaine was injected around the knee joint.  Excellent range of motion and patella tracking was confirmed  Hemovac drain was placed   The wound was closed with #1 interrupted Braylon suture the extensor mechanism. One layer of 0 Monocryl in the deep subcutaneous fat and then a second layer of 0 Monocryl in the subcutaneous tissue. Staples were used to reapproximate the skin edges and a sterile bandage was applied   the patient was taken  recovery room in stable condition. We plan for routine postoperative course.  I have seen the postop x-ray and the alignment is acceptable and there appeared to be no complications.

## 2014-09-09 NOTE — Evaluation (Signed)
Physical Therapy Evaluation Patient Details Name: Kimberly [name] MRN: [id number] DOB: Jan 07, 1951 Today's Date: 09/09/2014   History of Present Illness  Kimberly [name], 64 y.o. female, has a history of pain and functional disability in the left knee due to arthritis and has failed non-surgical conservative treatments for greater than 12 weeks to includeNSAID's and/or analgesics and activity modification.  Onset of symptoms was gradual, starting Several years ago with gradually worsening course since that time. The patient noted no past surgery on the left knee(s).  Patient currently rates pain in the left knee(s) at 7 out of 10 with activity. Patient has night pain, worsening of pain with activity and weight bearing, pain that interferes with activities of daily living, pain with passive range of motion, crepitus and joint swelling.  Patient has evidence of subchondral cysts, subchondral sclerosis, periarticular osteophytes and joint space narrowing by imaging studies.   Pt underwent Lt TKA on 09/09/14; MD orders for WBAT.   Clinical Impression  Pt is a 64 year old female who presents to PT s/p Lt TKA on 09/09/14; MD orders for WBAT.  Pt reports a hx of Rt TKA ~1 year ago, and reports having all supplies needed at home.  Reviewed HEP for bed exercises of ankle pumps, heel slides, and quad set and encouraged pt to complete x10 every hour while awake.  During evaluation, pt required min/mod assist for bed mobility skills to assist with Lt LE and min guard for transfers and gait with RW.  Gait distance limited secondary to fatigue, though pt did report "no pain" in the Lt LE in standing.  Recommend continued PT to address ROM, strength, activity tolerance and pain in the hospital and transition to HHPT at discharge. No DME recommendations at this time.      Follow Up Recommendations Home health PT    Equipment Recommendations  None recommended by PT       Precautions / Restrictions  Precautions Precautions: Fall Restrictions Weight Bearing Restrictions: Yes LLE Weight Bearing: Weight bearing as tolerated      Mobility  Bed Mobility Overal bed mobility: Needs Assistance Bed Mobility: Supine to Sit;Sit to Supine     Supine to sit: Min assist (for Lt LE) Sit to supine: Mod assist (for Lt LE)      Transfers Overall transfer level: Needs assistance Equipment used: Rolling walker (2 wheeled) Transfers: Sit to/from Stand Sit to Stand: Min guard         General transfer comment: VC for technique for LE and UE placement  Ambulation/Gait Ambulation/Gait assistance: Min guard Ambulation Distance (Feet): 10 Feet Assistive device: Rolling walker (2 wheeled) Gait Pattern/deviations: Step-to pattern Gait velocity: Step to gait secondary to recent surgical procedure, leading with Lt and stepping to with Rt Gait velocity interpretation: Below normal speed for age/gender       Balance Overall balance assessment: Needs assistance Sitting-balance support: Bilateral upper extremity supported;Feet supported Sitting balance-Leahy Scale: Good     Standing balance support: Bilateral upper extremity supported;During functional activity Standing balance-Leahy Scale: Fair                               Pertinent Vitals/Pain Pain Assessment: 0-10 Pain Score: 8  Pain Location: Lt knee Pain Intervention(s): Limited activity within patient's tolerance;RN gave pain meds during session;Monitored during session;Ice applied;Repositioned    Home Living Family/patient expects to be discharged to:: Private residence Living Arrangements: Spouse/significant other Available Help  at Discharge: Family (Husband works 2 days a week, though is only "a call away") Type of Home: Mobile home Home Access: Stairs to enter Entrance Stairs-Rails: Can reach both Entrance Stairs-Number of Steps: 5 Home Layout: One level Home Equipment: [place] - 2 wheels;Cane - single  point;Bedside commode;Grab bars - tub/shower;Shower seat;Hand held shower head Additional Comments: Walk In [employer]    Prior Function Level of Independence: Independent         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        Extremity/Trunk Assessment               Lower Extremity Assessment: LLE deficits/detail   LLE Deficits / Details: Knee flexion 50 degrees     Communication   Communication: No difficulties  Cognition Arousal/Alertness: Awake/alert Behavior During Therapy: WFL for tasks assessed/performed Overall Cognitive Status: Within Functional Limits for tasks assessed                         Exercises General Exercises - Lower Extremity Ankle Circles/Pumps: AROM;Both;5 reps;Supine Quad Sets: Both;5 reps;Strengthening;Supine Heel Slides: AROM;Both;5 reps;Supine      Assessment/Plan    PT Assessment Patient needs continued PT services  PT Diagnosis Difficulty walking;Generalized weakness;Acute pain   PT Problem List Decreased range of motion;Decreased strength;Pain;Decreased activity tolerance;Decreased balance;Decreased mobility  PT Treatment Interventions DME instruction;Balance training;Gait training;Neuromuscular re-education;Stair training;Functional mobility training;Therapeutic activities;Therapeutic exercise;Patient/family education;Manual techniques   PT Goals (Current goals can be found in the Care Plan section) Acute Rehab PT Goals Patient Stated Goal: go home PT Goal Formulation: With patient/family Time For Goal Achievement: 09/16/14 Potential to Achieve Goals: Good    Frequency Min 6X/week    End of Session Equipment Utilized During Treatment: Gait belt Activity Tolerance: Patient limited by fatigue;Patient limited by pain Patient left: in bed;with call bell/phone within reach;with bed alarm set;with family/visitor present           Time: 5056-9794 PT Time Calculation (min) (ACUTE ONLY): 29 min   Charges:   PT  Evaluation $Initial PT Evaluation Tier I: 1 Procedure PT Treatments $Self Care/Home Management: 8-22 (Techniques for transfers/gait, HEP of ankle pumps, heel slides, and quad set)   Lonna Cobb, DPT [phone]   09/09/2014, 3:19 PM

## 2014-09-09 NOTE — Brief Op Note (Signed)
09/09/2014  10:36 AM  PATIENT:  Kimberly [name]  64 y.o. female  PRE-OPERATIVE DIAGNOSIS:  LEFT KNEE OSTEOARTHRITIS  POST-OPERATIVE DIAGNOSIS:  LEFT KNEE OSTEOARTHRITIS  PROCEDURE:  Procedure(s): TOTAL KNEE ARTHROPLASTY (Left)   DEPUY SIGMA FB PS 27F 3T 10 POLY 41 P  SURGEON:  Surgeon(s) and Role:    * Carole Civil, MD - Primary  PHYSICIAN ASSISTANT:   ASSISTANTS: BETTY ASHLEY, CATHERINE PAGE    ANESTHESIA:   spinal  EBL:  Total I/O In: 1200 [I.V.:1200] Out: -   BLOOD ADMINISTERED:none  DRAINS: HEMOVAC    LOCAL MEDICATIONS USED:  MARCAINE   , Amount: 72 W EPI IN JOINT  ml and OTHER EXPAREL 20   SPECIMEN:  No Specimen  DISPOSITION OF SPECIMEN:  N/A  COUNTS:  YES  TOURNIQUET:   Total Tourniquet Time Documented: Thigh (Left) - 84 minutes Total: Thigh (Left) - 84 minutes   DICTATION: .Viviann Spare Dictation  PLAN OF CARE: Admit to inpatient   PATIENT DISPOSITION:  PACU - hemodynamically stable.   Delay start of Pharmacological VTE agent (>24hrs) due to surgical blood loss or risk of bleeding: yes

## 2014-09-09 NOTE — Anesthesia Procedure Notes (Signed)
Spinal Patient location during procedure: OR Start time: 09/09/2014 8:35 AM Staffing Resident/CRNA: Bernette Redbird J Preanesthetic Checklist Completed: patient identified, site marked, surgical consent, pre-op evaluation, timeout performed, IV checked, risks and benefits discussed and monitors and equipment checked Spinal Block Patient position: left lateral decubitus Prep: Betadine Patient monitoring: heart rate, cardiac monitor, continuous pulse ox and blood pressure Approach: left paramedian Location: L2-3 Injection technique: single-shot Needle Needle type: Spinocan  Needle gauge: 22 G Assessment Sensory level: T8 Additional Notes CSF brisk and clear        [id number]      10/2015

## 2014-09-10 ENCOUNTER — Encounter (HOSPITAL_COMMUNITY): Payer: Self-pay | Admitting: Orthopedic Surgery

## 2014-09-10 LAB — BASIC METABOLIC PANEL
Anion gap: 6 (ref 5–15)
BUN: 22 mg/dL (ref 6–23)
CO2: 30 mmol/L (ref 19–32)
Calcium: 8.6 mg/dL (ref 8.4–10.5)
Chloride: 105 mEq/L (ref 96–112)
Creatinine, Ser: 0.94 mg/dL (ref 0.50–1.10)
GFR, EST AFRICAN AMERICAN: 74 mL/min — AB (ref >90)
GFR, EST NON AFRICAN AMERICAN: 64 mL/min — AB (ref >90)
GLUCOSE: 163 mg/dL — AB (ref 70–99)
Potassium: 4.3 mmol/L (ref 3.5–5.1)
Sodium: 141 mmol/L (ref 135–145)

## 2014-09-10 LAB — CBC
HCT: 29.3 % — ABNORMAL LOW (ref 36.0–46.0)
HEMOGLOBIN: 9.8 g/dL — AB (ref 12.0–15.0)
MCH: 33.4 pg (ref 26.0–34.0)
MCHC: 33.4 g/dL (ref 30.0–36.0)
MCV: 100 fL (ref 78.0–100.0)
PLATELETS: 267 10*3/uL (ref 150–400)
RBC: 2.93 MIL/uL — AB (ref 3.87–5.11)
RDW: 12.9 % (ref 11.5–15.5)
WBC: 9.4 10*3/uL (ref 4.0–10.5)

## 2014-09-10 NOTE — Progress Notes (Signed)
UR chart review completed.  

## 2014-09-10 NOTE — Anesthesia Postprocedure Evaluation (Signed)
  Anesthesia Post-op Note  Patient: Kimberly [name]  Procedure(s) Performed: Procedure(s): LEFT TOTAL KNEE ARTHROPLASTY (Left)  Patient Location: Nursing Unit  Anesthesia Type:Spinal  Level of Consciousness: awake, alert  and oriented  Airway and Oxygen Therapy: Patient Spontanous Breathing  Post-op Pain: mild  Post-op Assessment: Post-op Vital signs reviewed, Patient's Cardiovascular Status Stable, Respiratory Function Stable, Patent Airway, No signs of Nausea or vomiting and Adequate PO intake  Post-op Vital Signs: Reviewed and stable  Last Vitals:  Filed Vitals:   09/10/14 0800  BP:   Pulse:   Temp:   Resp: 18    Complications: No apparent anesthesia complications

## 2014-09-10 NOTE — Progress Notes (Signed)
Physical Therapy Treatment Patient Details Name: Kimberly [name] MRN: [id number] DOB: 22-Sep-1950 Today's Date: 09/10/2014    History of Present Illness Kimberly [name], 64 y.o. female, has a history of pain and functional disability in the left knee due to arthritis and has failed non-surgical conservative treatments for greater than 12 weeks to includeNSAID's and/or analgesics and activity modification.  Onset of symptoms was gradual, starting Several years ago with gradually worsening course since that time. The patient noted no past surgery on the left knee(s).  Patient currently rates pain in the left knee(s) at 7 out of 10 with activity. Patient has night pain, worsening of pain with activity and weight bearing, pain that interferes with activities of daily living, pain with passive range of motion, crepitus and joint swelling.  Patient has evidence of subchondral cysts, subchondral sclerosis, periarticular osteophytes and joint space narrowing by imaging studies.   Pt underwent Lt TKA on 09/09/13; MD orders for WBAT.     PT Comments    Patient received supine in bed with family present and having just gotten back into bed with nursing staff; patient complained of 9/10 pain but had recently received pain medication from RN. Completed supine therapeutic exercises per protocol. Patient and family education regarding CPM, overall PT POC. CPM applied to patient supine in bed with range of 0-60 degrees. Pt left supine in bed with all needs otherwise met, nursing staff aware of CPM wear schedule. Knee extension found to be -13 degrees and flexion found to be 62 degrees on this date, limited by pain.     Follow Up Recommendations  Home health PT     Equipment Recommendations  None recommended by PT    Recommendations for Other Services       Precautions / Restrictions Precautions Precautions: Fall Restrictions Weight Bearing Restrictions: Yes LLE Weight Bearing: Weight bearing as  tolerated    Mobility  Bed Mobility                  Transfers                    Ambulation/Gait                 Stairs            Wheelchair Mobility    Modified Rankin (Stroke Patients Only)       Balance                                    Cognition Arousal/Alertness: Awake/alert Behavior During Therapy: WFL for tasks assessed/performed Overall Cognitive Status: Within Functional Limits for tasks assessed                      Exercises Total Joint Exercises Ankle Circles/Pumps: AROM;10 reps;Left;Supine Quad Sets: AROM;Left;10 reps;Supine Heel Slides: AAROM;10 reps;Left;Supine Straight Leg Raises: AAROM;Left;10 reps;Supine    General Comments        Pertinent Vitals/Pain Pain Assessment: 0-10 Pain Score: 9  Pain Location: L knee, patient had just received pain medicine from RN Pain Descriptors / Indicators: Sore Pain Intervention(s): Limited activity within patient's tolerance;Monitored during session;Ice applied    Home Living                      Prior Function            PT Goals (current goals  can now be found in the care plan section) Acute Rehab PT Goals Patient Stated Goal: to go home, return to PLOF PT Goal Formulation: With patient/family Time For Goal Achievement: 09/16/14 Potential to Achieve Goals: Good Progress towards PT goals: Progressing toward goals    Frequency  Min 6X/week    PT Plan Current plan remains appropriate    Co-evaluation             End of Session   Activity Tolerance: Patient tolerated treatment well;Patient limited by pain Patient left: in bed;with call bell/phone within reach;with family/visitor present     Time: 7322-5672 PT Time Calculation (min) (ACUTE ONLY): 20 min  Charges:  $Therapeutic Exercise: 8-22 mins                    G Codes:      Kimberly [name] PT, DPT 09/10/2014, 3:40 PM

## 2014-09-10 NOTE — Progress Notes (Signed)
OT Cancellation Note  Patient Details Name: Kimberly [name] MRN: [id number] DOB: [birth year]   Cancelled Treatment:    Reason Eval/Treat Not Completed: OT screened, no needs identified, will sign off.  Pt has WFL strength and ROm in BUE. Pt previously had R TKA and verbalizes awareness of her needs after d/c home.  Pt has shower seat and BSC. Pt and husband have no concerns about ADL needs.  Pt needs no further OT services at this time.   Bea Graff Elisabet Gutzmer, MS, OTR/L [facility] [phone] 09/10/2014, 1:44 PM

## 2014-09-10 NOTE — Clinical Social Work Note (Signed)
CSW received consult for possible SNF after TKA. Pt has worked with PT twice and is progressing well. Plan is for return home with home health. CSW will sign off, but can be reconsulted if needed.  Benay Pike, [place]

## 2014-09-10 NOTE — Progress Notes (Signed)
Patients catheter was removed per MD order by Raquel Sarna nursing student and nursing instructor was at bedside assisting student. Patient tolerated removal well and with no complications. Patient is expected to void by 1730 today. Will continue to monitor patients urine output.

## 2014-09-10 NOTE — Progress Notes (Signed)
Inpatient Diabetes Program Recommendations  AACE/ADA: New Consensus Statement on Inpatient Glycemic Control (2013)  Target Ranges:  Prepandial:   less than 140 mg/dL      Peak postprandial:   less than 180 mg/dL (1-2 hours)      Critically ill patients:  140 - 180 mg/dL   Results for Kimberly [name], Kimberly [name] (MRN [id number]) as of 09/10/2014 08:58  Ref. Range 09/09/2014 07:12 09/09/2014 10:49  Glucose-Capillary Latest Range: 70-99 mg/dL 158 (H) 180 (H)    Diabetes history: DM 2 Outpatient Diabetes medications: Metformin 500 mg BID Current orders for Inpatient glycemic control: Metformin 500 mg BID.  Inpatient Diabetes Program Recommendations Correction (SSI): Noted Pt had one dose of Decadron 4mg  1/13 at 0804, which is likely to contribute to hyperglycemia. Please consider ordering pt CBG's ACHS with Novolog 0-9 units TID with meals, Novolog 0-5 units QHS.  Thanks, Tama Headings RN, MSN, [facility],The Inpatient Diabetes Coordinator Team Pager [phone]

## 2014-09-10 NOTE — Progress Notes (Signed)
Physical Therapy Treatment Patient Details Name: Kimberly [name] MRN: [id number] DOB: 1951/01/25 Today's Date: 09/10/2014    History of Present Illness      PT Comments    Pt pleasant and eager to participate with therapy today.  Pt reported no pain at beginning of session and through bed exercises.  Pt able to complete all bed exercises with minimal cueing for proper technique with AA with SAQ, supine abduction and SLR.  Min assistance required with Lt LE supine to sitting on with cueing for hand and feet placement prior sit and stand.  Able to ambulate with RW for 70 feet with min guard and cueing to increase step length Rt and stance phase on Lt to improve gait mechanics.  Pt stated pain increase to a 8/10 with weight bearing, decreased pain to 4/10 at end of session seated with ice on knee.  Pt left in chair with call bell wihtin reach and ice applied to knee.  Pt reported discussion with MD about discomfort in the past with CPM, MD encouraged pt to try it out and discharge is painful.  Encourage therapist applying CPM this afternoon to have femur length the shortest prior using for comfort/  Follow Up Recommendations        Equipment Recommendations       Recommendations for Other Services       Precautions / Restrictions Precautions Precautions: Fall Restrictions Weight Bearing Restrictions: Yes LLE Weight Bearing: Weight bearing as tolerated    Mobility  Bed Mobility Overal bed mobility: Modified Independent Bed Mobility: Supine to Sit     Supine to sit: Min assist        Transfers Overall transfer level: Needs assistance Equipment used: Rolling walker (2 wheeled) Transfers: Sit to/from Stand Sit to Stand: Min guard         General transfer comment: VC for technique for LE and UE placement  Ambulation/Gait Ambulation/Gait assistance: Min guard Ambulation Distance (Feet): 70 Feet Assistive device: Rolling walker (2 wheeled) Gait Pattern/deviations:  Step-to pattern;Step-through pattern;Decreased step length - right;Decreased stance time - left   Gait velocity interpretation: Below normal speed for age/gender General Gait Details: cueing for equalize step length   Stairs            Wheelchair Mobility    Modified Rankin (Stroke Patients Only)       Balance                                    Cognition Arousal/Alertness: Awake/alert Behavior During Therapy: WFL for tasks assessed/performed Overall Cognitive Status: Within Functional Limits for tasks assessed                      Exercises Total Joint Exercises Ankle Circles/Pumps: AROM;Both;10 reps;Supine Quad Sets: AROM;Left;10 reps;Supine Short Arc Quad: AAROM;Left;5 reps;Supine Heel Slides: AAROM;Left;10 reps;Supine Hip ABduction/ADduction: AAROM;Left;10 reps;Supine Straight Leg Raises: AAROM;Left;5 reps;Supine    General Comments        Pertinent Vitals/Pain Pain Score: 4  Pain Location: Lt knee, 0/10 supine exercises, 8/10 with weight bearing 4/10 following gait with ice Pain Descriptors / Indicators: Sore    Home Living                      Prior Function            PT Goals (current goals can now be found in the  care plan section) Progress towards PT goals: Progressing toward goals    Frequency       PT Plan Current plan remains appropriate    Co-evaluation             End of Session Equipment Utilized During Treatment: Gait belt Activity Tolerance: Patient tolerated treatment well;Patient limited by fatigue Patient left: in chair;with call bell/phone within reach     Time: 0910-0950 PT Time Calculation (min) (ACUTE ONLY): 40 min  Charges:  $Gait Training: 8-22 mins $Therapeutic Exercise: 8-22 mins $Therapeutic Activity: 8-22 mins                    G Codes:      [name] 09/10/2014, 10:56 AM

## 2014-09-10 NOTE — Care Management Note (Addendum)
    Page 1 of 1   09/11/2014     11:04:29 AM CARE MANAGEMENT NOTE 09/11/2014  Patient:  KINSLEE, DALPE   Account Number:  [id number]  Date Initiated:  09/10/2014  Documentation initiated by:  Theophilus Kinds  Subjective/Objective Assessment:   Pt admitted from home s/p total left knee. Pt lives with husband and will return home at discharge. Pthas a walker and BSC for home use.     Action/Plan:   Pt would like [facility] for PT and CPM at discharge. Will arrange prior to d/c.   Anticipated DC Date:  09/12/2014   Anticipated DC Plan:  [place]  CM consult      [facility] Choice  HOME HEALTH   Choice offered to / List presented to:  C-1 Patient        [place] arranged  [place] PT      [place].   Status of service:  Completed, signed off Medicare Important Message given?   (If response is "NO", the following Medicare IM given date fields will be blank) Date Medicare IM given:   Medicare IM given by:   Date Additional Medicare IM given:   Additional Medicare IM given by:    Discharge Disposition:  [place]  Per UR Regulation:    If discussed at Long Length of Stay Meetings, dates discussed:    Comments:  09/11/14 [place], RN BSN CM Pt discharged home today with [facility] PT/OT (per pts choice). Romualdo Bolk of [facility] is aware and will collect the pts information from the chart. [place] services to start within 48 hours of discharge. No CPM needed at discharge per MD. Pt and pts nurse aware of discharge arrangements.  09/10/14 [place], RN BSN CM

## 2014-09-10 NOTE — Progress Notes (Signed)
  POD # 1  The patient indicated that the TED hose are bothering her so we will remove them. She was having burning across her incision which has now resolved. She was also having a headache which was not relieved by pain medications and she requested ibuprofen which seems to have helped. I am interested in the postop follow-up by the anesthesia department as it is unclear whether this is related to her spinal anestheti  VS BP 134/54 mmHg  Pulse 68  Temp(Src) 98.2 F (36.8 C) (Oral)  Resp 20  Ht 5\' 3"  (1.6 m)  Wt 219 lb 1.6 oz (99.383 kg)  BMI 38.82 kg/m2  SpO2 95%   LABS  Hemoglobin & Hematocrit     Component Value Date/Time   HGB 9.8* 09/10/2014 0620   HCT 29.3* 09/10/2014 0620   BMET    Component Value Date/Time   NA 141 09/10/2014 0620   K 4.3 09/10/2014 0620   CL 105 09/10/2014 0620   CO2 30 09/10/2014 0620   GLUCOSE 163* 09/10/2014 0620   BUN 22 09/10/2014 0620   CREATININE 0.94 09/10/2014 0620   CREATININE 1.06 08/11/2014 0701   CALCIUM 8.6 09/10/2014 0620   GFRNONAA 64* 09/10/2014 0620   GFRAA 74* 09/10/2014 0620    DRESSING dry  Neuro-vasculo-motor status of operative limb normal  A/P  Stable  Headache improved  Start PT

## 2014-09-10 NOTE — Addendum Note (Signed)
Addendum  created 09/10/14 1134 by Ollen Bowl, CRNA   Modules edited: Notes Section   Notes Section:  File: [id number]

## 2014-09-11 LAB — CBC
HEMATOCRIT: 29.1 % — AB (ref 36.0–46.0)
Hemoglobin: 9.7 g/dL — ABNORMAL LOW (ref 12.0–15.0)
MCH: 33.4 pg (ref 26.0–34.0)
MCHC: 33.3 g/dL (ref 30.0–36.0)
MCV: 100.3 fL — ABNORMAL HIGH (ref 78.0–100.0)
PLATELETS: 242 10*3/uL (ref 150–400)
RBC: 2.9 MIL/uL — ABNORMAL LOW (ref 3.87–5.11)
RDW: 12.8 % (ref 11.5–15.5)
WBC: 9.2 10*3/uL (ref 4.0–10.5)

## 2014-09-11 LAB — GLUCOSE, CAPILLARY: GLUCOSE-CAPILLARY: 127 mg/dL — AB (ref 70–99)

## 2014-09-11 MED ORDER — METHOCARBAMOL 500 MG PO TABS: 500.0000 mg | ORAL_TABLET | Freq: Four times a day (QID) | ORAL | Status: DC | PRN

## 2014-09-11 MED ORDER — ASPIRIN 325 MG PO TBEC: 325.0000 mg | DELAYED_RELEASE_TABLET | Freq: Two times a day (BID) | ORAL | Status: DC

## 2014-09-11 MED ORDER — HYDROCODONE-ACETAMINOPHEN 10-325 MG PO TABS: 1.0000 | ORAL_TABLET | ORAL | Status: DC

## 2014-09-11 NOTE — Discharge Summary (Signed)
Physician Discharge Summary  Patient ID: Kimberly [name] MRN: [id number] DOB/AGE: 04/13/1951 64 y.o.  Admit date: 09/09/2014 Discharge date: 09/11/2014   Dx OA LEFT KNEE   COURSE: Admission on January 13 for total knee arthroplasty left knee. Tolerated well under spinal anesthetic complications. Postop films show appropriate alignment of implant  Postop day 1 and 2 patient tolerated physical therapy well progressing to 70 of knee flexion and greater than 100 feet of ambulation  Wound was clean neurovascular exam was intact  Drain was removed  The patient is afebrile comfortable on Norco 10 mg.  Disposition: 06-Home-Health Care Svc  Discharge Instructions    Call MD / Call 911    Complete by:  As directed   If you experience chest pain or shortness of breath, CALL 911 and be transported to the hospital emergency room.  If you develope a fever above 101 F, pus (white drainage) or increased drainage or redness at the wound, or calf pain, call your surgeon's office.     Change dressing    Complete by:  As directed   Change dressing on saturday, then change the dressing daily with sterile 4 x 4 inch gauze dressing and apply TED hose.  You may clean the incision with alcohol prior to redressing.     Constipation Prevention    Complete by:  As directed   Drink plenty of fluids.  Prune juice may be helpful.  You may use a stool softener, such as Colace (over the counter) 100 mg twice a day.  Use MiraLax (over the counter) for constipation as needed.     Diet - low sodium heart healthy    Complete by:  As directed      Do not put a pillow under the knee. Place it under the heel.    Complete by:  As directed      Increase activity slowly as tolerated    Complete by:  As directed             Medication List    STOP taking these medications        diclofenac 75 MG EC tablet  Commonly known as:  VOLTAREN      TAKE these medications        aspirin 325 MG EC tablet  Take 1  tablet (325 mg total) by mouth 2 (two) times daily.     hydrochlorothiazide 25 MG tablet  Commonly known as:  HYDRODIURIL  Take 1 tablet (25 mg total) by mouth daily.     HYDROcodone-acetaminophen 10-325 MG per tablet  Commonly known as:  NORCO  Take 1 tablet by mouth every 4 (four) hours.     levothyroxine 137 MCG tablet  Commonly known as:  SYNTHROID, LEVOTHROID  Take 1 tablet (137 mcg total) by mouth daily before breakfast.     losartan 100 MG tablet  Commonly known as:  COZAAR  TAKE 1 TABLET (100 MG TOTAL) qam     metFORMIN 500 MG tablet  Commonly known as:  GLUCOPHAGE  Take 1 tablet (500 mg total) by mouth 2 (two) times daily with a meal.     methocarbamol 500 MG tablet  Commonly known as:  ROBAXIN  Take 1 tablet (500 mg total) by mouth every 6 (six) hours as needed for muscle spasms.     metoprolol succinate 50 MG 24 hr tablet  Commonly known as:  TOPROL-XL  Take 1 tablet (50 mg total) by mouth at bedtime.  OVER THE COUNTER MEDICATION  Take 1 tablet by mouth daily. OTC estroven     pantoprazole 40 MG tablet  Commonly known as:  PROTONIX  Take 1 tablet (40 mg total) by mouth daily.     venlafaxine XR 150 MG 24 hr capsule  Commonly known as:  EFFEXOR-XR  TAKE 1 CAPSULE BY MOUTH DAILY     verapamil 180 MG CR tablet  Commonly known as:  CALAN-SR  Take 1 tablet (180 mg total) by mouth at bedtime.           Follow-up Information    Follow up with Arther Abbott, MD.   Specialties:  Orthopedic Surgery, Radiology   Contact information:   [address] [address] [phone]      The patient did not tolerate CPM machine and this will not be used in her postoperative course Aspirin will be used for DVT prevention 1 month Postop visit postop day #14 for staple removal  [facility] for PT   Signed: Arther Abbott 09/11/2014, 10:26 AM

## 2014-09-11 NOTE — Progress Notes (Signed)
Patient discharged with instructions, prescription, and care notes.  Verbalized understanding via teach back.  IV was removed and the site was WNL. Patient voiced no further complaints or concerns at the time of discharge.  Appointments scheduled per instructions.  Patient left the floor via w/c with staff and family in stable condition.  Ice packs were sent home with the patients and was instructed to use the packs.

## 2014-09-11 NOTE — Progress Notes (Signed)
Physical Therapy Treatment Patient Details Name: Kimberly [name] MRN: [id number] DOB: 03-08-1951 Today's Date: 09/11/2014    History of Present Illness Kimberly [name], 64 y.o. female, has a history of pain and functional disability in the left knee due to arthritis and has failed non-surgical conservative treatments for greater than 12 weeks to includeNSAID's and/or analgesics and activity modification.  Onset of symptoms was gradual, starting Several years ago with gradually worsening course since that time. The patient noted no past surgery on the left knee(s).  Patient currently rates pain in the left knee(s) at 7 out of 10 with activity. Patient has night pain, worsening of pain with activity and weight bearing, pain that interferes with activities of daily living, pain with passive range of motion, crepitus and joint swelling.  Patient has evidence of subchondral cysts, subchondral sclerosis, periarticular osteophytes and joint space narrowing by imaging studies.   Pt underwent Lt TKA on 09/09/13; MD orders for WBAT.     PT Comments    Pt is progressing rapidly with independence in transfers from bed and chair, ambulating with a walker functional distances with a walker and stable gait pattern.  Knee ROM left = 5-70 degrees, AA.  She is anxious to get home today.  She was unable to tolerate CPM yesterday due to pain in the left hip.    Follow Up Recommendations  Home health PT     Equipment Recommendations  None recommended by PT    Recommendations for Other Services  none     Precautions / Restrictions Precautions Precautions: Fall;Knee Restrictions Weight Bearing Restrictions: No LLE Weight Bearing: Weight bearing as tolerated    Mobility  Bed Mobility Overal bed mobility: Modified Independent Bed Mobility: Supine to Sit     Supine to sit: Modified independent (Device/Increase time) Sit to supine: Modified independent (Device/Increase time)   General bed mobility  comments: pt has been going to the bathroom independentlty per her report  Transfers Overall transfer level: Modified independent Equipment used: Rolling walker (2 wheeled) Transfers: Sit to/from Stand Sit to Stand: Modified independent (Device/Increase time)         General transfer comment: VC for technique for LE and UE placement  Ambulation/Gait Ambulation/Gait assistance: Supervision Ambulation Distance (Feet): 60 Feet Assistive device: Rolling walker (2 wheeled) Gait Pattern/deviations: Step-to pattern;Decreased step length - right   Gait velocity interpretation: Below normal speed for age/gender General Gait Details: cueing for equalize step length   Stairs            Wheelchair Mobility    Modified Rankin (Stroke Patients Only)       Balance     Sitting balance-Leahy Scale: Good     Standing balance support: Bilateral upper extremity supported;During functional activity Standing balance-Leahy Scale: Good                      Cognition Arousal/Alertness: Awake/alert Behavior During Therapy: WFL for tasks assessed/performed Overall Cognitive Status: Within Functional Limits for tasks assessed                      Exercises Total Joint Exercises Ankle Circles/Pumps: AROM;10 reps;Left;Supine Quad Sets: AROM;Left;10 reps;Supine Short Arc Quad: AAROM;Left;Supine;10 reps    General Comments        Pertinent Vitals/Pain Pain Score: 5  Pain Descriptors / Indicators: Aching Pain Intervention(s): Limited activity within patient's tolerance;RN gave pain meds during session;Ice applied    Home Living  Prior Function            PT Goals (current goals can now be found in the care plan section) Progress towards PT goals: Progressing toward goals    Frequency  Min 6X/week    PT Plan Current plan remains appropriate    Co-evaluation             End of Session Equipment Utilized During  Treatment: Gait belt Activity Tolerance: Patient tolerated treatment well;Patient limited by pain Patient left: in chair;with call bell/phone within reach;with nursing/sitter in room (MD in with pt)     Time: 4270-6237 PT Time Calculation (min) (ACUTE ONLY): 41 min  Charges:  $Gait Training: 8-22 mins $Therapeutic Exercise: 8-22 mins $Self Care/Home Management: 8-22                    G Codes:      Kimberly [name] 09-23-2014, 10:45 AM

## 2014-09-12 LAB — TYPE AND SCREEN
ABO/RH(D): O POS
ANTIBODY SCREEN: NEGATIVE
Unit division: 0
Unit division: 0

## 2014-09-21 ENCOUNTER — Ambulatory Visit: Payer: 59 | Admitting: Orthopedic Surgery

## 2014-09-22 ENCOUNTER — Ambulatory Visit (INDEPENDENT_AMBULATORY_CARE_PROVIDER_SITE_OTHER): Payer: 59 | Admitting: Orthopedic Surgery

## 2014-09-22 DIAGNOSIS — Z96652 Presence of left artificial knee joint: Secondary | ICD-10-CM

## 2014-09-22 NOTE — Progress Notes (Signed)
Postop visit status post left  total knee arthroplasty  Implant used Depuy sigma fixed-bearing posterior stabilized total knee  Complications none  Preoperative diagnosis osteoarthritis of the knee  Operative report and findings: Left total knee  Preop diagnosis osteoarthritis left knee   Postop diagnosis same   Procedure left total knee arthroplasty   Implants Depuy  Fixed bearing posterior stabilized SIGMA  IMPLANT WITH SIZE 3 FEMUR, SIZE 3 TIBIA, SIZED 41 PATELLA AND SIZE 10 POLYETHYLENE INSERT   MEDS: NORCO 10   Current DVT prophylaxis aspirin  Current pain medication minimal   The patient is currently at   home  The knee incision is clean dry and intact with no erythema or drainage. Mild effusion is noted in the knee but nothing out of the ordinary.  The plan is for the patient to followup in 4 weeks and to continue advancing in physical therapy as tolerated. DVT prophylaxis will be continued for 28 days postop. The CPM will be continued for 21 days postop.

## 2014-09-22 NOTE — Patient Instructions (Signed)
Call to arrange outpatient therapy

## 2014-09-30 ENCOUNTER — Ambulatory Visit (HOSPITAL_COMMUNITY)
Admission: RE | Admit: 2014-09-30 | Discharge: 2014-09-30 | Disposition: A | Discharge status: Home / Self Care | Payer: 59 | Source: Ambulatory Visit | Attending: Orthopedic Surgery | Admitting: Orthopedic Surgery

## 2014-09-30 ENCOUNTER — Encounter (HOSPITAL_COMMUNITY): Payer: Self-pay | Admitting: Physical Therapy

## 2014-09-30 DIAGNOSIS — Z471 Aftercare following joint replacement surgery: Secondary | ICD-10-CM | POA: Insufficient documentation

## 2014-09-30 DIAGNOSIS — Z96652 Presence of left artificial knee joint: Secondary | ICD-10-CM | POA: Insufficient documentation

## 2014-09-30 DIAGNOSIS — R269 Unspecified abnormalities of gait and mobility: Secondary | ICD-10-CM

## 2014-09-30 DIAGNOSIS — M25562 Pain in left knee: Secondary | ICD-10-CM

## 2014-09-30 NOTE — Patient Instructions (Signed)
Hamstring Curl   Lift arms out from sides for balance. Shift weight onto right leg, while bending left knee. Repeat, switching legs. Repeat _10__ times, alternating legs. Do _2__ times per day.  Copyright  VHI. All rights reserved.  Heel Raises   Stand with support. Tighten pelvic floor and hold. With knees straight, raise heels off ground.  Repeat _10__ times. Do _2__ times a day.  Copyright  VHI. All rights reserved.  Hamstring Stretch   Place sound foot on the floor, keep back and residual limb knee straight. Lean forward until a stretch is felt in back of residual limb thigh. Hold ___30_ seconds. Repeat __2__ times. Do _2___ sessions per day.  Copyright  VHI. All rights reserved.  Achilles Tendon Stretch   Stand with hands supported on wall, elbows slightly bent, feet parallel and both heels on floor, front knee bent, back knee straight. Slowly relax back knee until a stretch is felt in achilles tendon. Hold __30__ seconds. Repeat with leg positions switched. Do two repetitions per set, repeat 2 times per day.   Copyright  VHI. All rights reserved.

## 2014-09-30 NOTE — Therapy (Signed)
[place], [place], [address] Phone: [phone]   Fax:  [phone]  Physical Therapy Evaluation  Patient Details  Name: Kimberly [name] MRN: [id number] Date of Birth: [birth year] Referring Provider:  Carole Civil, MD  Encounter Date: 09/30/2014      PT End of Session - 09/30/14 1610    Visit Number 1   Number of Visits 18   Date for PT Re-Evaluation 10/20/14   Authorization Type UMR   Authorization Time Period 09/30/14-11/29/14   Authorization - Visit Number 1   Authorization - Number of Visits 24   PT Start Time 4431   PT Stop Time 1430   PT Time Calculation (min) 33 min   Activity Tolerance Patient tolerated treatment well   Behavior During Therapy [facility] for tasks assessed/performed      Past Medical History  Diagnosis Date  . Diabetes mellitus   . High blood pressure   . Hypothyroid   . Arthritis   . Obesity   . Depression   . PONV (postoperative nausea and vomiting)     Past Surgical History  Procedure Laterality Date  . Carpal tunnel release Right   . Thyroid surgery      removal  . Abdominal hysterectomy    . Bunions Right   . Knee arthroscopy Right   . Total knee arthroplasty Right 08/25/2013    Procedure: TOTAL KNEE ARTHROPLASTY;  Surgeon: Carole Civil, MD;  Location: [facility];  Service: Orthopedics;  Laterality: Right;  . Total knee arthroplasty Left 09/09/2014    Procedure: LEFT TOTAL KNEE ARTHROPLASTY;  Surgeon: Carole Civil, MD;  Location: [facility];  Service: Orthopedics;  Laterality: Left;    There were no vitals taken for this visit.  Visit Diagnosis:  Status post total left knee replacement  Abnormality of gait  Pain in joint, lower leg, left      Subjective Assessment - 09/30/14 1604    Symptoms Knee pain and swelling L leg secondary to being s/p total knee replacement    Pertinent History Patient had her R knee replaced approx one year ago, then began to  have significant pain and difficulty with her L knee, resulting in another total knee replacement   How long can you sit comfortably? unlimited   How long can you stand comfortably? patient does not give time limits but does state she is unsteady   How long can you walk comfortably? Cannot walk continuously for extended distances, uses cane for outdoor walking   Pain Score 4    Pain Location Knee   Pain Orientation Left   Pain Descriptors / Indicators Aching;Discomfort          [facility] PT Assessment - 09/30/14 0001    Assessment   Medical Diagnosis s/p L total knee replacement   Onset Date 09/09/14   Next MD Visit 10/22/14   Precautions   Precautions --   Restrictions   Weight Bearing Restrictions No   Balance Screen   Has the patient fallen in the past 6 months No   Has the patient had a decrease in activity level because of a fear of falling?  No   Is the patient reluctant to leave their home because of a fear of falling?  No   Home Environment   Living Enviornment Private residence   Prior Function   Level of Independence Independent with transfers;Independent with gait;Independent with basic ADLs   Observation/Other Assessments  Observations Limited hip IR during gait, reduced stance time L leg, noticeable unsteadiness noted with gait without AD   Focus on Therapeutic Outcomes (FOTO)  54% limited   AROM   Right Hip External Rotation  40   Right Hip Internal Rotation  20   Left Hip External Rotation  35   Left Hip Internal Rotation  16   Left Knee Extension -9   Left Knee Flexion 92   Right Ankle Dorsiflexion 20   Left Ankle Dorsiflexion 10   Strength   Right Hip Flexion 3-/5   Right Hip Extension 3-/5   Right Hip ABduction 3+/5   Left Hip Flexion 3/5   Left Hip Extension 2+/5   Left Hip ABduction 3+/5   Right Knee Flexion 4-/5   Right Knee Extension 4/5   Left Knee Flexion 4-/5   Left Knee Extension 4/5   Right Ankle Dorsiflexion 4/5   Left Ankle Dorsiflexion  4/5                          PT Education - 09/30/14 1608    Education provided Yes   Education Details Patient instructed to continue to use cane for ambulation over unsteady surfaces at this time   Person(s) Educated Patient   Methods Explanation   Comprehension Verbalized understanding          PT Short Term Goals - 09/30/14 1615    PT SHORT TERM GOAL #1   Title Patient will c/o no more than 2/10 pain L knee during upright functional weight bearing tasks of at least 90 minutes in duration   Time 3   Period Weeks   Status New   PT SHORT TERM GOAL #2   Title Patient will demonstrate at least 4-/10 strength in B hip musculature, including flexors, ABD, and extensors in order to reduce pain and increase knee stability    Time 3   Period Weeks   Status New   PT SHORT TERM GOAL #3   Title Patient will demonstrate L knee range of at least -5 degrees knee extension and 105 degrees L knee flexion in order to enhance gait and overall pain free mobility   Time 3   Period Weeks   Status New   PT SHORT TERM GOAL #4   Title Patient will demonstrate the ability to ascend/descent at least 6 average height steps with U railing and only occasional cues from therapist, pain no more than 2/10 throughout           PT Long Term Goals - 09/30/14 1618    PT LONG TERM GOAL #1   Title Patient will demonstrate the ability to participate in upright weight bearing tasks for at least 4 hours with no more than 1/10 pain L knee   Time 6   Period Weeks   Status New   PT LONG TERM GOAL #2   Title Patient will demonstrate the ability to ascend/descent full flight of average height steps with no railings, good safety and sequencing, and no cues from therapist, no more than 1/10 pain L knee throughout   Time 6   Period Weeks   Status New   PT LONG TERM GOAL #3   Title Patient will demonstrate the ability to ambulate over unsteady surfaces such as grass or gravel without AD, good  balance recovery skills, and pain no more than 1/10   Time 6   Period Weeks   PT  LONG TERM GOAL #4   Title Patient will demonstrate the ability to consistently and correctly adhere to appropriate HEP in order to enhance function and gains made during skilled PT services   PT LONG TERM GOAL #5   Title Patient will demonstrate L knee range of 0 degrees extension and at least 115 degrees flexion in order to facilitate enhanced mobility and improved mechanics                Plan - 09/30/14 1611    Clinical Impression Statement Patient presents with residual pain, reduced mobility, reduced strength, reduced ROM, and edema following L total knee replacement, also demonstrates significant hip weakness that could possibly contribute to current knee discomfort. Patient shows limited hip IR during functional tasks. Patient will benefit  from skilled PT services  in order to return L leg to [facility] of functional strength, mobility, and range, as well as to enhance patient's function so that she may return to normal activities and full time employment.    Pt will benefit from skilled therapeutic intervention in order to improve on the following deficits Abnormal gait;Decreased skin integrity;Decreased activity tolerance;Decreased strength;Impaired flexibility;Decreased balance;Decreased mobility;Difficulty walking;Decreased range of motion;Increased edema;Pain;Decreased coordination   Rehab Potential Good   PT Frequency 3x / week   PT Duration 6 weeks   PT Treatment/Interventions Gait training;Neuromuscular re-education;Stair training;Scar mobilization;Functional mobility training;Patient/family education;Passive range of motion;Cryotherapy;Therapeutic activities;Therapeutic exercise;Manual techniques;DME Instruction;Balance training   PT Next Visit Plan functional strengthening with focus on L knee and B hip musculature, gait training, work on L knee ROM   Consulted and Agree with Plan of Care Patient          Problem List Patient Active Problem List   Diagnosis Date Noted  . Primary osteoarthritis of left knee   . Diabetes 08/12/2014  . Esophageal reflux 06/02/2014  . Gastritis and gastroduodenitis 06/02/2014  . S/P total knee replacement 10/28/2013  . Stiffness of right knee 09/17/2013  . S/P knee replacement 09/02/2013  . Unspecified constipation 09/02/2013  . Bilateral knee pain 08/07/2013  . Microproteinuria 07/16/2013  . Essential hypertension, benign 07/06/2013  . Hypothyroidism 07/06/2013  . Medial meniscus, posterior horn derangement 01/19/2011  . OA (osteoarthritis) of knee 01/19/2011  . Mononeuritis leg 01/19/2011    Deniece Ree PT, DPT [place] [address] [place], [place], [address] Phone: [phone]   Fax:  [phone]

## 2014-10-02 ENCOUNTER — Ambulatory Visit (HOSPITAL_COMMUNITY)
Admission: RE | Admit: 2014-10-02 | Discharge: 2014-10-02 | Disposition: A | Discharge status: Home / Self Care | Payer: 59 | Source: Ambulatory Visit | Attending: Orthopedic Surgery | Admitting: Orthopedic Surgery

## 2014-10-02 DIAGNOSIS — R269 Unspecified abnormalities of gait and mobility: Secondary | ICD-10-CM

## 2014-10-02 DIAGNOSIS — M25661 Stiffness of right knee, not elsewhere classified: Secondary | ICD-10-CM

## 2014-10-02 DIAGNOSIS — M25562 Pain in left knee: Secondary | ICD-10-CM

## 2014-10-02 DIAGNOSIS — Z96652 Presence of left artificial knee joint: Secondary | ICD-10-CM

## 2014-10-02 DIAGNOSIS — Z471 Aftercare following joint replacement surgery: Secondary | ICD-10-CM | POA: Diagnosis not present

## 2014-10-02 NOTE — Therapy (Signed)
[facility] [address] [place], [place], [address] Phone: [phone]   Fax:  [phone]  Physical Therapy Treatment  Patient Details  Name: LOANY NEUROTH MRN: [id number] Date of Birth: 1950/09/12 Referring Provider:  Carole Civil, MD  Encounter Date: 10/02/2014      PT End of Session - 10/02/14 1421    Visit Number 2   Number of Visits 18   Date for PT Re-Evaluation 10/20/14   Authorization Type UMR   Authorization Time Period 09/30/14-11/29/14   Authorization - Visit Number 2   Authorization - Number of Visits 24   PT Start Time 1341   PT Stop Time 1440   PT Time Calculation (min) 59 min   Equipment Utilized During Treatment Gait belt   Activity Tolerance Patient tolerated treatment well   Behavior During Therapy [facility] for tasks assessed/performed      Past Medical History  Diagnosis Date  . Diabetes mellitus   . High blood pressure   . Hypothyroid   . Arthritis   . Obesity   . Depression   . PONV (postoperative nausea and vomiting)     Past Surgical History  Procedure Laterality Date  . Carpal tunnel release Right   . Thyroid surgery      removal  . Abdominal hysterectomy    . Bunions Right   . Knee arthroscopy Right   . Total knee arthroplasty Right 08/25/2013    Procedure: TOTAL KNEE ARTHROPLASTY;  Surgeon: Carole Civil, MD;  Location: [facility];  Service: Orthopedics;  Laterality: Right;  . Total knee arthroplasty Left 09/09/2014    Procedure: LEFT TOTAL KNEE ARTHROPLASTY;  Surgeon: Carole Civil, MD;  Location: [facility];  Service: Orthopedics;  Laterality: Left;    There were no vitals taken for this visit.  Visit Diagnosis:  Status post total left knee replacement  Abnormality of gait  Pain in joint, lower leg, left  Stiffness of right knee      Subjective Assessment - 10/02/14 1345    Symptoms Currently pain free and has taken no medication today, compliant with HEP   Currently  in Pain? No/denies          [facility] PT Assessment - 10/02/14 0001    Assessment   Medical Diagnosis s/p L total knee replacement   Onset Date 09/09/14   Next MD Visit 10/22/14   AROM   Left Knee Extension -7   Left Knee Flexion 105                  [facility] Adult PT Treatment/Exercise - 10/02/14 1346    Exercises   Exercises Knee/Hip   Knee/Hip Exercises: Stretches   Active Hamstring Stretch 3 reps;30 seconds   Active Hamstring Stretch Limitations 3 way direction 14in step   Quad Stretch 3 reps;30 seconds   Quad Stretch Limitations prone with rope   Knee: Self-Stretch to increase Flexion Limitations   Knee: Self-Stretch Limitations knee drive on 8in step 3 directions 10x   Gastroc Stretch 3 reps;30 seconds   Gastroc Stretch Limitations slant board   Knee/Hip Exercises: Aerobic   Stationary Bike Reciprocal 8' seat 9 for ROM   Knee/Hip Exercises: Standing   Heel Raises 10 reps   Heel Raises Limitations Toe raises 10x   Functional Squat 10 reps   Functional Squat Limitations 3D hip excursion split stance with Lt LE behind   Knee/Hip Exercises: Supine   Quad Sets Left;10 reps  Terminal Knee Extension 10 reps   Terminal Knee Extension Limitations 5" holds   Straight Leg Raises Left;10 reps   Knee/Hip Exercises: Sidelying   Hip ABduction Limitations   Hip ABduction Limitations begin next session.   Knee/Hip Exercises: Prone   Hamstring Curl 10 reps   Hip Extension 10 reps   Modalities   Modalities Cryotherapy                  PT Short Term Goals - 10/02/14 1406    PT SHORT TERM GOAL #1   Title Patient will c/o no more than 2/10 pain L knee during upright functional weight bearing tasks of at least 90 minutes in duration   Status On-going   PT SHORT TERM GOAL #2   Title Patient will demonstrate at least 4-/10 strength in B hip musculature, including flexors, ABD, and extensors in order to reduce pain and increase knee stability    Status On-going   PT  SHORT TERM GOAL #3   Title Patient will demonstrate L knee range of at least -5 degrees knee extension and 105 degrees L knee flexion in order to enhance gait and overall pain free mobility   PT SHORT TERM GOAL #4   Title Patient will demonstrate the ability to ascend/descent at least 6 average height steps with U railing and only occasional cues from therapist, pain no more than 2/10 throughout           PT Long Term Goals - 10/02/14 1408    PT LONG TERM GOAL #1   Title Patient will demonstrate the ability to participate in upright weight bearing tasks for at least 4 hours with no more than 1/10 pain L knee   PT LONG TERM GOAL #2   Title Patient will demonstrate the ability to ascend/descent full flight of average height steps with no railings, good safety and sequencing, and no cues from therapist, no more than 1/10 pain L knee throughout   PT LONG TERM GOAL #3   Title Patient will demonstrate the ability to ambulate over unsteady surfaces such as grass or gravel without AD, good balance recovery skills, and pain no more than 1/10   PT LONG TERM GOAL #4   Title Patient will demonstrate the ability to consistently and correctly adhere to appropriate HEP in order to enhance function and gains made during skilled PT services   PT LONG TERM GOAL #5   Title Patient will demonstrate L knee range of 0 degrees extension and at least 115 degrees flexion in order to facilitate enhanced mobility and improved mechanics                Plan - 10/02/14 1421    Clinical Impression Statement Began PT POC with foucs on improving AROM and functional strengthening.  Pt able to demonstrate appropraite technique with all exercises with min cueing for form/technique.  Tactile cueing to improve distal quad contraction to improve knee extensin.  AROM improving -7-105 degrees.  Ice applied at end of session with pain and edema control, pt encouraged to take pain medication as prescrbed 30 minuites to an hour  prior therapy for pain control.     PT Next Visit Plan functional strengthening with focus on L knee and B hip musculature, gait training, work on L knee ROM, Next session begin piriformis stretches, rockerboard to improve weight distribution with gait and continue AROM exercises.          Problem List Patient Active Problem  List   Diagnosis Date Noted  . Primary osteoarthritis of left knee   . Diabetes 08/12/2014  . Esophageal reflux 06/02/2014  . Gastritis and gastroduodenitis 06/02/2014  . S/P total knee replacement 10/28/2013  . Stiffness of right knee 09/17/2013  . S/P knee replacement 09/02/2013  . Unspecified constipation 09/02/2013  . Bilateral knee pain 08/07/2013  . Microproteinuria 07/16/2013  . Essential hypertension, benign 07/06/2013  . Hypothyroidism 07/06/2013  . Medial meniscus, posterior horn derangement 01/19/2011  . OA (osteoarthritis) of knee 01/19/2011  . Mononeuritis leg 01/19/2011   Ihor Austin, Marianne  [name] 10/02/2014, 2:30 PM  [facility] [address] [place], [place], [address] Phone: [phone]   Fax:  [phone]

## 2014-10-05 ENCOUNTER — Ambulatory Visit (HOSPITAL_COMMUNITY)
Admission: RE | Admit: 2014-10-05 | Discharge: 2014-10-05 | Disposition: A | Discharge status: Home / Self Care | Payer: 59 | Source: Ambulatory Visit | Attending: Orthopedic Surgery | Admitting: Orthopedic Surgery

## 2014-10-05 DIAGNOSIS — Z96652 Presence of left artificial knee joint: Secondary | ICD-10-CM

## 2014-10-05 DIAGNOSIS — R269 Unspecified abnormalities of gait and mobility: Secondary | ICD-10-CM

## 2014-10-05 DIAGNOSIS — Z471 Aftercare following joint replacement surgery: Secondary | ICD-10-CM | POA: Diagnosis not present

## 2014-10-05 DIAGNOSIS — M25562 Pain in left knee: Secondary | ICD-10-CM

## 2014-10-05 NOTE — Therapy (Signed)
[place], [place], [address] Phone: [phone]   Fax:  [phone]  Physical Therapy Treatment  Patient Details  Name: Kimberly [name] MRN: [id number] Date of Birth: 12/14/1950 Referring Provider:  Carole Civil, MD  Encounter Date: 10/05/2014      PT End of Session - 10/05/14 1142    Visit Number 3   Number of Visits 18   Date for PT Re-Evaluation 10/20/14   Authorization Type UMR   Authorization Time Period 09/30/14-11/29/14   Authorization - Visit Number 3   Authorization - Number of Visits 24   PT Start Time 1101   PT Stop Time 1145   PT Time Calculation (min) 44 min   Activity Tolerance Patient tolerated treatment well   Behavior During Therapy [facility] for tasks assessed/performed      Past Medical History  Diagnosis Date  . Diabetes mellitus   . High blood pressure   . Hypothyroid   . Arthritis   . Obesity   . Depression   . PONV (postoperative nausea and vomiting)     Past Surgical History  Procedure Laterality Date  . Carpal tunnel release Right   . Thyroid surgery      removal  . Abdominal hysterectomy    . Bunions Right   . Knee arthroscopy Right   . Total knee arthroplasty Right 08/25/2013    Procedure: TOTAL KNEE ARTHROPLASTY;  Surgeon: Carole Civil, MD;  Location: [facility];  Service: Orthopedics;  Laterality: Right;  . Total knee arthroplasty Left 09/09/2014    Procedure: LEFT TOTAL KNEE ARTHROPLASTY;  Surgeon: Carole Civil, MD;  Location: [facility];  Service: Orthopedics;  Laterality: Left;    There were no vitals taken for this visit.  Visit Diagnosis:  Status post total left knee replacement  Abnormality of gait  Pain in joint, lower leg, left      Subjective Assessment - 10/05/14 1103    Symptoms No c/o of pain, did take pain medicine this morning   Pertinent History Patient had her R knee replaced approx one year ago, then began to have significant pain  and difficulty with her L knee, resulting in another total knee replacement   Currently in Pain? No/denies                    [facility] Adult PT Treatment/Exercise - 10/05/14 0001    Ambulation/Gait   Stair Management Technique Two rails;Forwards;Other (comment)  step over step pattern   Number of Stairs --  8 four inch stepsx2, 4 eight inch stairs x2   Knee/Hip Exercises: Stretches   Active Hamstring Stretch 3 reps;30 seconds   Quad Stretch 3 reps;30 seconds   Quad Stretch Limitations prone with rope   Piriformis Stretch 2 reps;30 seconds   Gastroc Stretch 3 reps;30 seconds   Gastroc Stretch Limitations slant board   Knee/Hip Exercises: Standing   Forward Lunges 1 set;10 reps;Left   Forward Lunges Limitations on 6 inch box, focus on forward knee drive   Side Lunges Limitations Attempted side lunges onto 6 inch box with L leg; however patient did experience 8/10 lateral knee pain so activity terminated   Functional Squat --  1x12 sit to stands, focus on eccentric control   Rocker Board 3 minutes   Rocker Board Limitations lateral on wooden rockerboard   Other Standing Knee Exercises 3D hip excursions x10   Other Standing Knee Exercises Backwards mini-lunges 1x10; mild discomfort  but no pain                  PT Short Term Goals - 10/02/14 1406    PT SHORT TERM GOAL #1   Title Patient will c/o no more than 2/10 pain L knee during upright functional weight bearing tasks of at least 90 minutes in duration   Status On-going   PT SHORT TERM GOAL #2   Title Patient will demonstrate at least 4-/10 strength in B hip musculature, including flexors, ABD, and extensors in order to reduce pain and increase knee stability    Status On-going   PT SHORT TERM GOAL #3   Title Patient will demonstrate L knee range of at least -5 degrees knee extension and 105 degrees L knee flexion in order to enhance gait and overall pain free mobility   PT SHORT TERM GOAL #4   Title Patient  will demonstrate the ability to ascend/descent at least 6 average height steps with U railing and only occasional cues from therapist, pain no more than 2/10 throughout           PT Long Term Goals - 10/02/14 1408    PT LONG TERM GOAL #1   Title Patient will demonstrate the ability to participate in upright weight bearing tasks for at least 4 hours with no more than 1/10 pain L knee   PT LONG TERM GOAL #2   Title Patient will demonstrate the ability to ascend/descent full flight of average height steps with no railings, good safety and sequencing, and no cues from therapist, no more than 1/10 pain L knee throughout   PT LONG TERM GOAL #3   Title Patient will demonstrate the ability to ambulate over unsteady surfaces such as grass or gravel without AD, good balance recovery skills, and pain no more than 1/10   PT LONG TERM GOAL #4   Title Patient will demonstrate the ability to consistently and correctly adhere to appropriate HEP in order to enhance function and gains made during skilled PT services   PT LONG TERM GOAL #5   Title Patient will demonstrate L knee range of 0 degrees extension and at least 115 degrees flexion in order to facilitate enhanced mobility and improved mechanics                Plan - 10/05/14 1151    Clinical Impression Statement Continued PT exercise program with focus on functional strength and functional range; good participation and technique throughout session on this date. Continues to demonstrate deficits in terminal knee extension and quad strength. Unable to perform side lunges without pain in knee at this time. Ice applied at end of treatment session. Patient education regarding regarding patellar mobs to assist in increasing knee mobility.    Pt will benefit from skilled therapeutic intervention in order to improve on the following deficits Abnormal gait;Decreased skin integrity;Decreased activity tolerance;Decreased strength;Impaired  flexibility;Decreased balance;Decreased mobility;Difficulty walking;Decreased range of motion;Increased edema;Pain;Decreased coordination   Rehab Potential Good   PT Frequency 3x / week   PT Duration 6 weeks   PT Next Visit Plan functional strengthening, functional range, focus on L knee and B hip musculature, improve terminal knee extension in weight bearing, rockerboard, piriformis stretch, patellar mobs   Consulted and Agree with Plan of Care Patient        Problem List Patient Active Problem List   Diagnosis Date Noted  . Primary osteoarthritis of left knee   . Diabetes 08/12/2014  . Esophageal  reflux 06/02/2014  . Gastritis and gastroduodenitis 06/02/2014  . S/P total knee replacement 10/28/2013  . Stiffness of right knee 09/17/2013  . S/P knee replacement 09/02/2013  . Unspecified constipation 09/02/2013  . Bilateral knee pain 08/07/2013  . Microproteinuria 07/16/2013  . Essential hypertension, benign 07/06/2013  . Hypothyroidism 07/06/2013  . Medial meniscus, posterior horn derangement 01/19/2011  . OA (osteoarthritis) of knee 01/19/2011  . Mononeuritis leg 01/19/2011    Deniece Ree PT, DPT [place] [address] [place], [place], [address] Phone: [phone]   Fax:  [phone]

## 2014-10-07 ENCOUNTER — Ambulatory Visit (HOSPITAL_COMMUNITY)
Admission: RE | Admit: 2014-10-07 | Discharge: 2014-10-07 | Disposition: A | Discharge status: Home / Self Care | Payer: 59 | Source: Ambulatory Visit | Attending: Orthopedic Surgery | Admitting: Orthopedic Surgery

## 2014-10-07 DIAGNOSIS — R269 Unspecified abnormalities of gait and mobility: Secondary | ICD-10-CM

## 2014-10-07 DIAGNOSIS — Z471 Aftercare following joint replacement surgery: Secondary | ICD-10-CM | POA: Diagnosis not present

## 2014-10-07 DIAGNOSIS — Z96652 Presence of left artificial knee joint: Secondary | ICD-10-CM

## 2014-10-07 DIAGNOSIS — M25562 Pain in left knee: Secondary | ICD-10-CM

## 2014-10-07 NOTE — Therapy (Signed)
[place], [place], [address] Phone: [phone]   Fax:  [phone]  Physical Therapy Treatment  Patient Details  Name: Kimberly [name] MRN: [id number] Date of Birth: 1951-06-02 Referring Provider:  Carole Civil, MD  Encounter Date: 10/07/2014      PT End of Session - 10/07/14 1157    Visit Number 4   Number of Visits 18   Date for PT Re-Evaluation 10/20/14   Authorization Type UMR   Authorization Time Period 09/30/14-11/29/14   Authorization - Visit Number 4   Authorization - Number of Visits 24   PT Start Time 1120   PT Stop Time 1210   PT Time Calculation (min) 50 min      Past Medical History  Diagnosis Date  . Diabetes mellitus   . High blood pressure   . Hypothyroid   . Arthritis   . Obesity   . Depression   . PONV (postoperative nausea and vomiting)     Past Surgical History  Procedure Laterality Date  . Carpal tunnel release Right   . Thyroid surgery      removal  . Abdominal hysterectomy    . Bunions Right   . Knee arthroscopy Right   . Total knee arthroplasty Right 08/25/2013    Procedure: TOTAL KNEE ARTHROPLASTY;  Surgeon: Carole Civil, MD;  Location: [facility];  Service: Orthopedics;  Laterality: Right;  . Total knee arthroplasty Left 09/09/2014    Procedure: LEFT TOTAL KNEE ARTHROPLASTY;  Surgeon: Carole Civil, MD;  Location: [facility];  Service: Orthopedics;  Laterality: Left;    There were no vitals taken for this visit.  Visit Diagnosis:  Status post total left knee replacement  Abnormality of gait  Pain in joint, lower leg, left      Subjective Assessment - 10/07/14 1123    Symptoms Pt states she is having some pain at the insicion site.   Currently in Pain? Yes   Pain Score 4    Pain Orientation Left               [facility] Adult PT Treatment/Exercise - 10/07/14 0001    Ambulation/Gait   Stairs Yes   Knee/Hip Exercises: Stretches   Engineer, civil (consulting) 3 reps;30 seconds   Quad Stretch Limitations prone with rope   Gastroc Stretch 3 reps;30 seconds   Gastroc Stretch Limitations slantboard   Knee/Hip Exercises: Aerobic   Stationary Bike seat at 8 x 8'    Knee/Hip Exercises: Standing   Heel Raises 10 reps   Heel Raises Limitations with  squat and ball on box Yellopw    Knee Flexion Strengthening;Left   Forward Lunges Left;15 reps   Forward Lunges Limitations 6 "   Lateral Step Up Left;10 reps   Forward Step Up Left;10 reps   Functional Squat 10 reps   Stairs 2 round trip no hands up ; one hand down    Rocker Board 3 minutes   SLS 5 x best    Knee/Hip Exercises: Supine   Quad Sets 10 reps   Knee/Hip Exercises: Prone   Hamstring Curl 10 reps   Hamstring Curl Limitations 3#   Hip Extension Left;10 reps                PT Education - 10/07/14 1155    Education provided Yes   Education Details begin balance activity everyday for the rest of her life for fall prevention  Person(s) Educated Patient   Methods Explanation;Demonstration   Comprehension Returned demonstration          PT Short Term Goals - 10/07/14 1141    PT SHORT TERM GOAL #1   Title Patient will c/o no more than 2/10 pain L knee during upright functional weight bearing tasks of at least 90 minutes in duration   Time 3   Period Weeks   Status Achieved   PT SHORT TERM GOAL #2   Title Patient will demonstrate at least 4-/10 strength in B hip musculature, including flexors, ABD, and extensors in order to reduce pain and increase knee stability    Time 3   Period Weeks   Status On-going   PT SHORT TERM GOAL #3   Title Patient will demonstrate L knee range of at least -5 degrees knee extension and 105 degrees L knee flexion in order to enhance gait and overall pain free mobility   Time 3   Period Weeks   Status Achieved   PT SHORT TERM GOAL #4   Title Patient will demonstrate the ability to ascend/descent at least 6 average height steps with U  railing and only occasional cues from therapist, pain no more than 2/10 throughout   Time 3   Period Weeks   Status On-going           PT Long Term Goals - 10/02/14 1408    PT LONG TERM GOAL #1   Title Patient will demonstrate the ability to participate in upright weight bearing tasks for at least 4 hours with no more than 1/10 pain L knee   PT LONG TERM GOAL #2   Title Patient will demonstrate the ability to ascend/descent full flight of average height steps with no railings, good safety and sequencing, and no cues from therapist, no more than 1/10 pain L knee throughout   PT LONG TERM GOAL #3   Title Patient will demonstrate the ability to ambulate over unsteady surfaces such as grass or gravel without AD, good balance recovery skills, and pain no more than 1/10   PT LONG TERM GOAL #4   Title Patient will demonstrate the ability to consistently and correctly adhere to appropriate HEP in order to enhance function and gains made during skilled PT services   PT LONG TERM GOAL #5   Title Patient will demonstrate L knee range of 0 degrees extension and at least 115 degrees flexion in order to facilitate enhanced mobility and improved mechanics            Plan - 10/07/14 1159    Clinical Impression Statement Pt contiues to gain ROM and strength.  ROM 7-105 today, added lateral and forward step ups for strength as well as SLS for balance.  Pt able to ambulate without an antalgic gait.   Pt will benefit from skilled therapeutic intervention in order to improve on the following deficits Abnormal gait;Decreased skin integrity;Decreased activity tolerance;Decreased strength;Impaired flexibility;Decreased balance;Decreased mobility;Difficulty walking;Decreased range of motion;Increased edema;Pain;Decreased coordination   PT Next Visit Plan Continue adding patellar mobs        Problem List Patient Active Problem List   Diagnosis Date Noted  . Primary osteoarthritis of left knee   .  Diabetes 08/12/2014  . Esophageal reflux 06/02/2014  . Gastritis and gastroduodenitis 06/02/2014  . S/P total knee replacement 10/28/2013  . Stiffness of right knee 09/17/2013  . S/P knee replacement 09/02/2013  . Unspecified constipation 09/02/2013  . Bilateral knee pain 08/07/2013  .  Microproteinuria 07/16/2013  . Essential hypertension, benign 07/06/2013  . Hypothyroidism 07/06/2013  . Medial meniscus, posterior horn derangement 01/19/2011  . OA (osteoarthritis) of knee 01/19/2011  . Mononeuritis leg 01/19/2011    RUSSELL,CINDY PT 10/07/2014, 12:01 PM  [place], [place], [address] Phone: [phone]   Fax:  [phone]

## 2014-10-09 ENCOUNTER — Ambulatory Visit (HOSPITAL_COMMUNITY)
Admission: RE | Admit: 2014-10-09 | Discharge: 2014-10-09 | Disposition: A | Discharge status: Home / Self Care | Payer: 59 | Source: Ambulatory Visit | Attending: Orthopedic Surgery | Admitting: Orthopedic Surgery

## 2014-10-09 DIAGNOSIS — M25562 Pain in left knee: Secondary | ICD-10-CM

## 2014-10-09 DIAGNOSIS — Z96652 Presence of left artificial knee joint: Secondary | ICD-10-CM

## 2014-10-09 DIAGNOSIS — R269 Unspecified abnormalities of gait and mobility: Secondary | ICD-10-CM

## 2014-10-09 DIAGNOSIS — M25661 Stiffness of right knee, not elsewhere classified: Secondary | ICD-10-CM

## 2014-10-09 DIAGNOSIS — Z471 Aftercare following joint replacement surgery: Secondary | ICD-10-CM | POA: Diagnosis not present

## 2014-10-09 NOTE — Therapy (Signed)
[place], [place], [address] Phone: [phone]   Fax:  [phone]  Physical Therapy Treatment  Patient Details  Name: Kimberly [name] MRN: [id number] Date of Birth: 03-23-1951 Referring Provider:  Carole Civil, MD  Encounter Date: 10/09/2014      PT End of Session - 10/09/14 1207    Visit Number 5   Number of Visits 18   Date for PT Re-Evaluation 10/20/14   Authorization Type UMR   Authorization Time Period 09/30/14-11/29/14   Authorization - Visit Number 5   Authorization - Number of Visits 24   PT Start Time 3557   PT Stop Time 1100   PT Time Calculation (min) 45 min   Activity Tolerance Patient tolerated treatment well   Behavior During Therapy [facility] for tasks assessed/performed      Past Medical History  Diagnosis Date  . Diabetes mellitus   . High blood pressure   . Hypothyroid   . Arthritis   . Obesity   . Depression   . PONV (postoperative nausea and vomiting)     Past Surgical History  Procedure Laterality Date  . Carpal tunnel release Right   . Thyroid surgery      removal  . Abdominal hysterectomy    . Bunions Right   . Knee arthroscopy Right   . Total knee arthroplasty Right 08/25/2013    Procedure: TOTAL KNEE ARTHROPLASTY;  Surgeon: Carole Civil, MD;  Location: [facility];  Service: Orthopedics;  Laterality: Right;  . Total knee arthroplasty Left 09/09/2014    Procedure: LEFT TOTAL KNEE ARTHROPLASTY;  Surgeon: Carole Civil, MD;  Location: [facility];  Service: Orthopedics;  Laterality: Left;    There were no vitals taken for this visit.  Visit Diagnosis:  Status post total left knee replacement  Abnormality of gait  Pain in joint, lower leg, left  Stiffness of right knee      Subjective Assessment - 10/09/14 1204    Symptoms Patient states she is not having true pain, just feels achey; continues to feel pain and tightness at incision site   Pertinent History  Patient had her R knee replaced approx one year ago, then began to have significant pain and difficulty with her L knee, resulting in another total knee replacement                    [facility] Adult PT Treatment/Exercise - 10/09/14 0001    Knee/Hip Exercises: Stretches   Active Hamstring Stretch 3 reps;30 seconds   Gastroc Stretch 3 reps;30 seconds   Gastroc Stretch Limitations slantboard   Knee/Hip Exercises: Standing   Heel Raises 10 reps   Heel Raises Limitations off of slantboard   Forward Lunges Both;10 reps   Forward Lunges Limitations 4 inch box   Side Lunges Right   Side Lunges Limitations unable to perform L leg due to pain   Forward Step Up Both;10 reps   Stairs 2 rounds one hand up and down   Rocker Board 3 minutes   Rocker Board Limitations lateral   SLS x10   Other Standing Knee Exercises 3D hip excursions x10   Other Standing Knee Exercises Backwards gait   Manual Therapy   Manual Therapy Other (comment)  Manual mobilization of scar and patella                PT Education - 10/09/14 1104    Education provided Yes   Education  Details Self scar and patella mobilization   Person(s) Educated Patient   Methods Explanation;Demonstration   Comprehension Verbalized understanding          PT Short Term Goals - 10/07/14 1141    PT SHORT TERM GOAL #1   Title Patient will c/o no more than 2/10 pain L knee during upright functional weight bearing tasks of at least 90 minutes in duration   Time 3   Period Weeks   Status Achieved   PT SHORT TERM GOAL #2   Title Patient will demonstrate at least 4-/10 strength in B hip musculature, including flexors, ABD, and extensors in order to reduce pain and increase knee stability    Time 3   Period Weeks   Status On-going   PT SHORT TERM GOAL #3   Title Patient will demonstrate L knee range of at least -5 degrees knee extension and 105 degrees L knee flexion in order to enhance gait and overall pain free  mobility   Time 3   Period Weeks   Status Achieved   PT SHORT TERM GOAL #4   Title Patient will demonstrate the ability to ascend/descent at least 6 average height steps with U railing and only occasional cues from therapist, pain no more than 2/10 throughout   Time 3   Period Weeks   Status On-going           PT Long Term Goals - 10/02/14 1408    PT LONG TERM GOAL #1   Title Patient will demonstrate the ability to participate in upright weight bearing tasks for at least 4 hours with no more than 1/10 pain L knee   PT LONG TERM GOAL #2   Title Patient will demonstrate the ability to ascend/descent full flight of average height steps with no railings, good safety and sequencing, and no cues from therapist, no more than 1/10 pain L knee throughout   PT LONG TERM GOAL #3   Title Patient will demonstrate the ability to ambulate over unsteady surfaces such as grass or gravel without AD, good balance recovery skills, and pain no more than 1/10   PT LONG TERM GOAL #4   Title Patient will demonstrate the ability to consistently and correctly adhere to appropriate HEP in order to enhance function and gains made during skilled PT services   PT LONG TERM GOAL #5   Title Patient will demonstrate L knee range of 0 degrees extension and at least 115 degrees flexion in order to facilitate enhanced mobility and improved mechanics                Plan - 10/09/14 1207    Clinical Impression Statement Patient tolerates all stretching and functional strengthening well but does continue to demonstrate difficulty with side lunges due to knee pain on surgical side. Patient does continue to have tightness in patellar mobilty and in incision scar, has been instructed to continue performing both at home, as she does so in the mornings anyway. Trendelenburg gait and tight hip rotation noted during gait.    Pt will benefit from skilled therapeutic intervention in order to improve on the following deficits  Abnormal gait;Decreased skin integrity;Decreased activity tolerance;Decreased strength;Impaired flexibility;Decreased balance;Decreased mobility;Difficulty walking;Decreased range of motion;Increased edema;Pain;Decreased coordination   Rehab Potential Good   PT Frequency 3x / week   PT Duration 6 weeks   PT Treatment/Interventions Gait training;Neuromuscular re-education;Stair training;Scar mobilization;Functional mobility training;Patient/family education;Passive range of motion;Cryotherapy;Therapeutic activities;Therapeutic exercise;Manual techniques;DME Instruction;Balance training   PT Next  Visit Plan Continue adding patellar mobs        Problem List Patient Active Problem List   Diagnosis Date Noted  . Primary osteoarthritis of left knee   . Diabetes 08/12/2014  . Esophageal reflux 06/02/2014  . Gastritis and gastroduodenitis 06/02/2014  . S/P total knee replacement 10/28/2013  . Stiffness of right knee 09/17/2013  . S/P knee replacement 09/02/2013  . Unspecified constipation 09/02/2013  . Bilateral knee pain 08/07/2013  . Microproteinuria 07/16/2013  . Essential hypertension, benign 07/06/2013  . Hypothyroidism 07/06/2013  . Medial meniscus, posterior horn derangement 01/19/2011  . OA (osteoarthritis) of knee 01/19/2011  . Mononeuritis leg 01/19/2011    Deniece Ree PT, DPT [place] [address] [place], [place], [address] Phone: [phone]   Fax:  [phone]

## 2014-10-14 ENCOUNTER — Ambulatory Visit (HOSPITAL_COMMUNITY): Payer: 59 | Admitting: Physical Therapy

## 2014-10-14 DIAGNOSIS — M25562 Pain in left knee: Secondary | ICD-10-CM

## 2014-10-14 DIAGNOSIS — Z471 Aftercare following joint replacement surgery: Secondary | ICD-10-CM | POA: Diagnosis not present

## 2014-10-14 DIAGNOSIS — M25661 Stiffness of right knee, not elsewhere classified: Secondary | ICD-10-CM

## 2014-10-14 DIAGNOSIS — R269 Unspecified abnormalities of gait and mobility: Secondary | ICD-10-CM

## 2014-10-14 DIAGNOSIS — Z96652 Presence of left artificial knee joint: Secondary | ICD-10-CM

## 2014-10-14 NOTE — Therapy (Signed)
[place], [place], [address] Phone: [phone]   Fax:  [phone]  Physical Therapy Treatment  Patient Details  Name: Kimberly [name] MRN: [id number] Date of Birth: 04-02-1951 Referring Provider:  Carole Civil, MD  Encounter Date: 10/14/2014      PT End of Session - 10/14/14 1149    Visit Number 6   Number of Visits 18   Date for PT Re-Evaluation 10/20/14   Authorization Type UMR   Authorization Time Period 09/30/14-11/29/14   Authorization - Visit Number 6   Authorization - Number of Visits 24   PT Start Time 7494   PT Stop Time 1143   PT Time Calculation (min) 45 min   Activity Tolerance Patient tolerated treatment well   Behavior During Therapy [facility] for tasks assessed/performed      Past Medical History  Diagnosis Date  . Diabetes mellitus   . High blood pressure   . Hypothyroid   . Arthritis   . Obesity   . Depression   . PONV (postoperative nausea and vomiting)     Past Surgical History  Procedure Laterality Date  . Carpal tunnel release Right   . Thyroid surgery      removal  . Abdominal hysterectomy    . Bunions Right   . Knee arthroscopy Right   . Total knee arthroplasty Right 08/25/2013    Procedure: TOTAL KNEE ARTHROPLASTY;  Surgeon: Carole Civil, MD;  Location: [facility];  Service: Orthopedics;  Laterality: Right;  . Total knee arthroplasty Left 09/09/2014    Procedure: LEFT TOTAL KNEE ARTHROPLASTY;  Surgeon: Carole Civil, MD;  Location: [facility];  Service: Orthopedics;  Laterality: Left;    There were no vitals taken for this visit.  Visit Diagnosis:  Status post total left knee replacement  Abnormality of gait  Pain in joint, lower leg, left  Stiffness of right knee      Subjective Assessment - 10/14/14 1059    Symptoms Patient continues to feel pain and tightness at incision site, no true    Pertinent History Patient had her R knee replaced approx one  year ago, then began to have significant pain and difficulty with her L knee, resulting in another total knee replacement                    [facility] Adult PT Treatment/Exercise - 10/14/14 0001    Knee/Hip Exercises: Stretches   Active Hamstring Stretch 3 reps;30 seconds   Active Hamstring Stretch Limitations on stairs   Quad Stretch 3 reps;30 seconds   Quad Stretch Limitations prone with rope   Hip Flexor Stretch 2 reps;30 seconds   Gastroc Stretch 3 reps;30 seconds   Gastroc Stretch Limitations slantboard   Knee/Hip Exercises: Aerobic   Elliptical 3 minutes at 40 steps per minute   Knee/Hip Exercises: Standing   Functional Squat 1 set;10 reps   Functional Squat Limitations Staggered stance with R leg forward to increase load on L   Lunge Walking - Round Trips 2x87ft with L leg leading  incorporated trunk and hip rotation with cross midline reach   Other Standing Knee Exercises Standing TKE 1x10 with green T-band   Other Standing Knee Exercises 3D hip excursions 1x10 in staggered stance;    Knee/Hip Exercises: Seated   Long Arc Quad 1 set;15 reps   Long Arc Quad Weight 0 lbs.   Long [employer] Limitations 3 second holds for [employer]  Knee/Hip Exercises: Supine   Bridges 1 set;15 reps   Knee/Hip Exercises: Sidelying   Hip ABduction 10 reps;2 sets;Both   Manual Therapy   Manual Therapy Other (comment)   Other Manual Therapy patellar mobilizations                  PT Short Term Goals - 10/07/14 1141    PT SHORT TERM GOAL #1   Title Patient will c/o no more than 2/10 pain L knee during upright functional weight bearing tasks of at least 90 minutes in duration   Time 3   Period Weeks   Status Achieved   PT SHORT TERM GOAL #2   Title Patient will demonstrate at least 4-/10 strength in B hip musculature, including flexors, ABD, and extensors in order to reduce pain and increase knee stability    Time 3   Period Weeks   Status On-going   PT SHORT TERM GOAL #3    Title Patient will demonstrate L knee range of at least -5 degrees knee extension and 105 degrees L knee flexion in order to enhance gait and overall pain free mobility   Time 3   Period Weeks   Status Achieved   PT SHORT TERM GOAL #4   Title Patient will demonstrate the ability to ascend/descent at least 6 average height steps with U railing and only occasional cues from therapist, pain no more than 2/10 throughout   Time 3   Period Weeks   Status On-going           PT Long Term Goals - 10/02/14 1408    PT LONG TERM GOAL #1   Title Patient will demonstrate the ability to participate in upright weight bearing tasks for at least 4 hours with no more than 1/10 pain L knee   PT LONG TERM GOAL #2   Title Patient will demonstrate the ability to ascend/descent full flight of average height steps with no railings, good safety and sequencing, and no cues from therapist, no more than 1/10 pain L knee throughout   PT LONG TERM GOAL #3   Title Patient will demonstrate the ability to ambulate over unsteady surfaces such as grass or gravel without AD, good balance recovery skills, and pain no more than 1/10   PT LONG TERM GOAL #4   Title Patient will demonstrate the ability to consistently and correctly adhere to appropriate HEP in order to enhance function and gains made during skilled PT services   PT LONG TERM GOAL #5   Title Patient will demonstrate L knee range of 0 degrees extension and at least 115 degrees flexion in order to facilitate enhanced mobility and improved mechanics                Plan - 10/14/14 1150    Clinical Impression Statement Patient continues to display tightness of soft tissues at scar incision but has been compliant with instructions to mobilize scar tissue at home. Attempted eliptical on this date- patient demonstrated poor activity tolerance but is safe to perform elliptical at home. Continued tightness noted in general spine and in hip IR. Progressed functional  exercises to mini-lunges without box and staggered stances to increase loading and weight bearing on L leg.   Pt will benefit from skilled therapeutic intervention in order to improve on the following deficits Abnormal gait;Decreased skin integrity;Decreased activity tolerance;Decreased strength;Impaired flexibility;Decreased balance;Decreased mobility;Difficulty walking;Decreased range of motion;Increased edema;Pain;Decreased coordination   Rehab Potential Good   PT Frequency 3x /  week   PT Duration 6 weeks   PT Treatment/Interventions Gait training;Neuromuscular re-education;Stair training;Scar mobilization;Functional mobility training;Patient/family education;Passive range of motion;Cryotherapy;Therapeutic activities;Therapeutic exercise;Manual techniques;DME Instruction;Balance training   PT Next Visit Plan Continue progression of functional exercises; continue patellar mobs; balance training; continue to address limited hip IR        Problem List Patient Active Problem List   Diagnosis Date Noted  . Primary osteoarthritis of left knee   . Diabetes 08/12/2014  . Esophageal reflux 06/02/2014  . Gastritis and gastroduodenitis 06/02/2014  . S/P total knee replacement 10/28/2013  . Stiffness of right knee 09/17/2013  . S/P knee replacement 09/02/2013  . Unspecified constipation 09/02/2013  . Bilateral knee pain 08/07/2013  . Microproteinuria 07/16/2013  . Essential hypertension, benign 07/06/2013  . Hypothyroidism 07/06/2013  . Medial meniscus, posterior horn derangement 01/19/2011  . OA (osteoarthritis) of knee 01/19/2011  . Mononeuritis leg 01/19/2011    Deniece Ree PT, DPT [place] [address] [place], [place], [address] Phone: [phone]   Fax:  [phone]

## 2014-10-15 ENCOUNTER — Encounter (HOSPITAL_COMMUNITY): Payer: 59 | Admitting: Physical Therapy

## 2014-10-15 ENCOUNTER — Telehealth (HOSPITAL_COMMUNITY): Payer: Self-pay | Admitting: Physical Therapy

## 2014-10-15 NOTE — Telephone Encounter (Signed)
Patient is sick today and will not be here

## 2014-10-16 ENCOUNTER — Encounter (HOSPITAL_COMMUNITY): Payer: 59

## 2014-10-19 ENCOUNTER — Ambulatory Visit (HOSPITAL_COMMUNITY): Payer: 59 | Admitting: Physical Therapy

## 2014-10-19 DIAGNOSIS — R269 Unspecified abnormalities of gait and mobility: Secondary | ICD-10-CM

## 2014-10-19 DIAGNOSIS — Z96652 Presence of left artificial knee joint: Secondary | ICD-10-CM

## 2014-10-19 DIAGNOSIS — M25562 Pain in left knee: Secondary | ICD-10-CM

## 2014-10-19 DIAGNOSIS — Z471 Aftercare following joint replacement surgery: Secondary | ICD-10-CM | POA: Diagnosis not present

## 2014-10-19 NOTE — Therapy (Signed)
[place], [place], [address] Phone: [phone]   Fax:  [phone]  Physical Therapy Treatment  Patient Details  Name: Kimberly [name] MRN: [id number] Date of Birth: Apr 26, 1951 Referring Provider:  Carole Civil, MD  Encounter Date: 10/19/2014      PT End of Session - 10/19/14 1149    Visit Number 7   Number of Visits 18   Date for PT Re-Evaluation 11/17/14   Authorization Type UMR   Authorization Time Period 09/30/14-11/29/14   Authorization - Visit Number 7   Authorization - Number of Visits 24   PT Start Time 1059   PT Stop Time 1144   PT Time Calculation (min) 45 min   Activity Tolerance Patient tolerated treatment well   Behavior During Therapy [facility] for tasks assessed/performed      Past Medical History  Diagnosis Date  . Diabetes mellitus   . High blood pressure   . Hypothyroid   . Arthritis   . Obesity   . Depression   . PONV (postoperative nausea and vomiting)     Past Surgical History  Procedure Laterality Date  . Carpal tunnel release Right   . Thyroid surgery      removal  . Abdominal hysterectomy    . Bunions Right   . Knee arthroscopy Right   . Total knee arthroplasty Right 08/25/2013    Procedure: TOTAL KNEE ARTHROPLASTY;  Surgeon: Carole Civil, MD;  Location: [facility];  Service: Orthopedics;  Laterality: Right;  . Total knee arthroplasty Left 09/09/2014    Procedure: LEFT TOTAL KNEE ARTHROPLASTY;  Surgeon: Carole Civil, MD;  Location: [facility];  Service: Orthopedics;  Laterality: Left;    There were no vitals taken for this visit.  Visit Diagnosis:  Status post total left knee replacement  Abnormality of gait  Pain in joint, lower leg, left      Subjective Assessment - 10/19/14 1100    Symptoms Patient states that she is having trouble sleeping at night due to pain in her knee, but can alleviate the pain by getting up and walking; less shooting pains  around the incision scar   How long can you sit comfortably? unlimited   How long can you stand comfortably? 2 hours   How long can you walk comfortably? 1 hour approximately   Currently in Pain? Yes   Pain Score 3    Pain Location Knee   Pain Orientation Left          [facility] PT Assessment - 10/19/14 0001    Assessment   Medical Diagnosis s/p L total knee replacement   Onset Date 09/09/14   Next MD Visit 10/22/14   Observation/Other Assessments   Focus on Therapeutic Outcomes (FOTO)  38% limited   AROM   Right Hip External Rotation  43   Right Hip Internal Rotation  24   Left Hip External Rotation  40   Left Hip Internal Rotation  20   Left Knee Extension -5   Left Knee Flexion 112   Right Ankle Dorsiflexion 20   Left Ankle Dorsiflexion 12   Strength   Right Hip Flexion 3+/5   Right Hip Extension 3/5   Right Hip ABduction 4-/5   Left Hip Flexion 3/5   Left Hip Extension 3/5   Left Hip ABduction 4-/5   Right Knee Flexion 4+/5   Right Knee Extension 4/5   Left Knee Flexion 4-/5   Left Knee  Extension 4-/5   Right Ankle Dorsiflexion 4+/5   Left Ankle Dorsiflexion 4+/5                  [facility] Adult PT Treatment/Exercise - 10/19/14 0001    Knee/Hip Exercises: Stretches   Active Hamstring Stretch 3 reps;30 seconds   Active Hamstring Stretch Limitations on stairs   Quad Stretch 2 reps;30 seconds   Quad Stretch Limitations prone with manual assist   Gastroc Stretch 3 reps;30 seconds   Gastroc Stretch Limitations slantboard   Knee/Hip Exercises: Standing   Forward Lunges Both;10 reps   Forward Lunges Limitations 4 inch box   Other Standing Knee Exercises Stepping over 6 inch hurdles forwards alternating with R and L legs leading                PT Education - 10/19/14 1149    Education provided Yes   Education Details Patient education regarding progress in PT and towards functional goals; education regarding PT POC moving forwards   Person(s) Educated  Patient   Methods Explanation   Comprehension Verbalized understanding          PT Short Term Goals - 10/19/14 1153    PT SHORT TERM GOAL #1   Title Patient will c/o no more than 2/10 pain L knee during upright functional weight bearing tasks of at least 90 minutes in duration   Time 3   Period Weeks   Status Achieved   PT SHORT TERM GOAL #2   Title Patient will demonstrate at least 4-/10 strength in B hip musculature, including flexors, ABD, and extensors in order to reduce pain and increase knee stability    Baseline See session note for specific measures   Time 3   Period Weeks   Status On-going   PT SHORT TERM GOAL #3   Title Patient will demonstrate L knee range of at least -5 degrees knee extension and 105 degrees L knee flexion in order to enhance gait and overall pain free mobility   Time 3   Period Weeks   Status Achieved   PT SHORT TERM GOAL #4   Title Patient will demonstrate the ability to ascend/descent at least 6 average height steps with U railing and only occasional cues from therapist, pain no more than 2/10 throughout   Baseline Continues to demonstrate biomechanical impairments with stair climbing   Time 3   Period Weeks   Status On-going           PT Long Term Goals - 10/19/14 1154    PT LONG TERM GOAL #1   Title Patient will demonstrate the ability to participate in upright weight bearing tasks for at least 4 hours with no more than 1/10 pain L knee   Time 6   Period Weeks   Status On-going   PT LONG TERM GOAL #2   Title Patient will demonstrate the ability to ascend/descent full flight of average height steps with no railings, good safety and sequencing, and no cues from therapist, no more than 1/10 pain L knee throughout   Time 6   Period Weeks   Status On-going   PT LONG TERM GOAL #3   Title Patient will demonstrate the ability to ambulate over unsteady surfaces such as grass or gravel without AD, good balance recovery skills, and pain no more  than 1/10   Time 6   Period Weeks   Status On-going   PT LONG TERM GOAL #4   Title Patient  will demonstrate the ability to consistently and correctly adhere to appropriate HEP in order to enhance function and gains made during skilled PT services   Period Weeks   Status On-going   PT LONG TERM GOAL #5   Title Patient will demonstrate L knee range of 0 degrees extension and at least 115 degrees flexion in order to facilitate enhanced mobility and improved mechanics    Time 6   Period Weeks   Status On-going               Plan - 10/19/14 1150    Clinical Impression Statement Reassessment performed on this date. Patient shows improvements in overall L knee and ankle ROM and strength, however continues to demonstrate general weakness in bilateral lower extremities and continues to display impairments in gait and on stairs especially. Difficulty with hurdles. Overall patient is progressing well and will benefit from skilled PT services in order to continue to improve general strength, ROM, functional task performance, and gait and stair climbing mechanics.    Pt will benefit from skilled therapeutic intervention in order to improve on the following deficits Abnormal gait;Decreased skin integrity;Decreased activity tolerance;Decreased strength;Impaired flexibility;Decreased balance;Decreased mobility;Difficulty walking;Decreased range of motion;Increased edema;Pain;Decreased coordination   Rehab Potential Good   PT Frequency 3x / week   PT Duration 6 weeks   PT Treatment/Interventions Gait training;Neuromuscular re-education;Stair training;Scar mobilization;Functional mobility training;Patient/family education;Passive range of motion;Cryotherapy;Therapeutic activities;Therapeutic exercise;Manual techniques;DME Instruction;Balance training   PT Next Visit Plan Continue progression of functional exercises; continue patellar mobs; balance training; continue to address limited hip IR; focus on  hurdles and stair mechanics   Consulted and Agree with Plan of Care Patient        Problem List Patient Active Problem List   Diagnosis Date Noted  . Primary osteoarthritis of left knee   . Diabetes 08/12/2014  . Esophageal reflux 06/02/2014  . Gastritis and gastroduodenitis 06/02/2014  . S/P total knee replacement 10/28/2013  . Stiffness of right knee 09/17/2013  . S/P knee replacement 09/02/2013  . Unspecified constipation 09/02/2013  . Bilateral knee pain 08/07/2013  . Microproteinuria 07/16/2013  . Essential hypertension, benign 07/06/2013  . Hypothyroidism 07/06/2013  . Medial meniscus, posterior horn derangement 01/19/2011  . OA (osteoarthritis) of knee 01/19/2011  . Mononeuritis leg 01/19/2011    Deniece Ree PT, DPT [place] [address] [place], [place], [address] Phone: [phone]   Fax:  [phone]

## 2014-10-21 ENCOUNTER — Ambulatory Visit (HOSPITAL_COMMUNITY): Payer: 59 | Admitting: Physical Therapy

## 2014-10-21 DIAGNOSIS — M25562 Pain in left knee: Secondary | ICD-10-CM

## 2014-10-21 DIAGNOSIS — R269 Unspecified abnormalities of gait and mobility: Secondary | ICD-10-CM

## 2014-10-21 DIAGNOSIS — Z96652 Presence of left artificial knee joint: Secondary | ICD-10-CM

## 2014-10-21 DIAGNOSIS — Z471 Aftercare following joint replacement surgery: Secondary | ICD-10-CM | POA: Diagnosis not present

## 2014-10-21 NOTE — Therapy (Signed)
[place], [place], [address] Phone: [phone]   Fax:  [phone]  Physical Therapy Treatment  Patient Details  Name: Kimberly [name] MRN: [id number] Date of Birth: [birth year] Referring Provider:  Carole Civil, MD  Encounter Date: 10/21/2014      PT End of Session - 10/21/14 1506    Visit Number 8   Number of Visits 18   Date for PT Re-Evaluation 11/17/14   Authorization Type UMR   Authorization Time Period 09/30/14-11/29/14   Authorization - Visit Number 8   Authorization - Number of Visits 24   PT Start Time 1301   PT Stop Time 1345   PT Time Calculation (min) 44 min   Activity Tolerance Patient tolerated treatment well   Behavior During Therapy [facility] for tasks assessed/performed      Past Medical History  Diagnosis Date  . Diabetes mellitus   . High blood pressure   . Hypothyroid   . Arthritis   . Obesity   . Depression   . PONV (postoperative nausea and vomiting)     Past Surgical History  Procedure Laterality Date  . Carpal tunnel release Right   . Thyroid surgery      removal  . Abdominal hysterectomy    . Bunions Right   . Knee arthroscopy Right   . Total knee arthroplasty Right 08/25/2013    Procedure: TOTAL KNEE ARTHROPLASTY;  Surgeon: Carole Civil, MD;  Location: [facility];  Service: Orthopedics;  Laterality: Right;  . Total knee arthroplasty Left 09/09/2014    Procedure: LEFT TOTAL KNEE ARTHROPLASTY;  Surgeon: Carole Civil, MD;  Location: [facility];  Service: Orthopedics;  Laterality: Left;    There were no vitals taken for this visit.  Visit Diagnosis:  Status post total left knee replacement  Abnormality of gait  Pain in joint, lower leg, left      Subjective Assessment - 10/21/14 1302    Symptoms Patient states she is not having any pain    Pertinent History Patient had her R knee replaced approx one year ago, then began to have significant pain and  difficulty with her L knee, resulting in another total knee replacement   Currently in Pain? No/denies                    [facility] Adult PT Treatment/Exercise - 10/21/14 0001    Ambulation/Gait   Gait Comments Stair training on four 7" steps x3; improved stair climbing pattern noted with only occasional circumduction   Knee/Hip Exercises: Stretches   Active Hamstring Stretch 3 reps;30 seconds   Active Hamstring Stretch Limitations 14" box   Quad Stretch 2 reps;30 seconds   Quad Stretch Limitations prone with rope   Piriformis Stretch 2 reps;30 seconds   Piriformis Stretch Limitations seated   Gastroc Stretch 3 reps;30 seconds   Gastroc Stretch Limitations slantboard   Knee/Hip Exercises: Standing   Forward Lunges Both;1 set;10 reps   Forward Lunges Limitations 2 inch box   Side Lunges Both;1 set;10 reps   Side Lunges Limitations 2 inch box   Lateral Step Up Both;1 set;10 reps   Lateral Step Up Limitations 4 inch box   Functional Squat 1 set;10 reps   Functional Squat Limitations Staggered stance with R leg forward approx 2-3 inches to increase load on L   Other Standing Knee Exercises Stepping over 6" and 12" hurdles; able to clear 6" hurdles without difficulty but had  circumduction/ER/excess knee flexion compensation to clear hurdles with 12" obstacles   Other Standing Knee Exercises 3D hip excursions split stance 1x10   Knee/Hip Exercises: Supine   Straight Leg Raises Both;1 set;10 reps   Knee/Hip Exercises: Sidelying   Hip ABduction Both;1 set;10 reps   Hip ABduction Limitations manual cues for correct form   Knee/Hip Exercises: Prone   Other Prone Exercises Prone hip extensions 1x10 bilaterally   Manual Therapy   Manual Therapy Joint mobilization   Joint Mobilization Grade 3 mobilizations for knee flexion, grade 2 for knee extension   Other Manual Therapy Patellar mobilization in lateral and superior/inferior directions                  PT Short Term  Goals - 10/19/14 1153    PT SHORT TERM GOAL #1   Title Patient will c/o no more than 2/10 pain L knee during upright functional weight bearing tasks of at least 90 minutes in duration   Time 3   Period Weeks   Status Achieved   PT SHORT TERM GOAL #2   Title Patient will demonstrate at least 4-/10 strength in B hip musculature, including flexors, ABD, and extensors in order to reduce pain and increase knee stability    Baseline See session note for specific measures   Time 3   Period Weeks   Status On-going   PT SHORT TERM GOAL #3   Title Patient will demonstrate L knee range of at least -5 degrees knee extension and 105 degrees L knee flexion in order to enhance gait and overall pain free mobility   Time 3   Period Weeks   Status Achieved   PT SHORT TERM GOAL #4   Title Patient will demonstrate the ability to ascend/descent at least 6 average height steps with U railing and only occasional cues from therapist, pain no more than 2/10 throughout   Baseline Continues to demonstrate biomechanical impairments with stair climbing   Time 3   Period Weeks   Status On-going           PT Long Term Goals - 10/19/14 1154    PT LONG TERM GOAL #1   Title Patient will demonstrate the ability to participate in upright weight bearing tasks for at least 4 hours with no more than 1/10 pain L knee   Time 6   Period Weeks   Status On-going   PT LONG TERM GOAL #2   Title Patient will demonstrate the ability to ascend/descent full flight of average height steps with no railings, good safety and sequencing, and no cues from therapist, no more than 1/10 pain L knee throughout   Time 6   Period Weeks   Status On-going   PT LONG TERM GOAL #3   Title Patient will demonstrate the ability to ambulate over unsteady surfaces such as grass or gravel without AD, good balance recovery skills, and pain no more than 1/10   Time 6   Period Weeks   Status On-going   PT LONG TERM GOAL #4   Title Patient will  demonstrate the ability to consistently and correctly adhere to appropriate HEP in order to enhance function and gains made during skilled PT services   Period Weeks   Status On-going   PT LONG TERM GOAL #5   Title Patient will demonstrate L knee range of 0 degrees extension and at least 115 degrees flexion in order to facilitate enhanced mobility and improved mechanics  Time 6   Period Weeks   Status On-going               Plan - 10/21/14 1507    Clinical Impression Statement Patient tolerating progression of exercises well; increased exercises on this date with no pain. Patient continues to have tightness and weakness in bilateral lower extremities but does appear to be physically improving each session. Continues to have difficulty with navigating over hurdles and demonstrates compensations as noted .    Pt will benefit from skilled therapeutic intervention in order to improve on the following deficits Abnormal gait;Decreased skin integrity;Decreased activity tolerance;Decreased strength;Impaired flexibility;Decreased balance;Decreased mobility;Difficulty walking;Decreased range of motion;Increased edema;Pain;Decreased coordination   PT Frequency 3x / week   PT Duration 6 weeks   PT Treatment/Interventions Gait training;Neuromuscular re-education;Stair training;Scar mobilization;Functional mobility training;Patient/family education;Passive range of motion;Cryotherapy;Therapeutic activities;Therapeutic exercise;Manual techniques;DME Instruction;Balance training   PT Next Visit Plan Continue progression of functional exercises; continue patellar mobs; balance training; continue to address limited hip IR; focus on hurdles and stair mechanics   Consulted and Agree with Plan of Care Patient        Problem List Patient Active Problem List   Diagnosis Date Noted  . Primary osteoarthritis of left knee   . Diabetes 08/12/2014  . Esophageal reflux 06/02/2014  . Gastritis and  gastroduodenitis 06/02/2014  . S/P total knee replacement 10/28/2013  . Stiffness of right knee 09/17/2013  . S/P knee replacement 09/02/2013  . Unspecified constipation 09/02/2013  . Bilateral knee pain 08/07/2013  . Microproteinuria 07/16/2013  . Essential hypertension, benign 07/06/2013  . Hypothyroidism 07/06/2013  . Medial meniscus, posterior horn derangement 01/19/2011  . OA (osteoarthritis) of knee 01/19/2011  . Mononeuritis leg 01/19/2011    Deniece Ree PT, DPT [facility] [address] [place], [place], [address] Phone: [phone]   Fax:  [phone]

## 2014-10-22 ENCOUNTER — Encounter: Payer: Self-pay | Admitting: Orthopedic Surgery

## 2014-10-22 ENCOUNTER — Ambulatory Visit (INDEPENDENT_AMBULATORY_CARE_PROVIDER_SITE_OTHER): Payer: 59 | Admitting: Orthopedic Surgery

## 2014-10-22 ENCOUNTER — Ambulatory Visit (HOSPITAL_COMMUNITY): Payer: 59 | Admitting: Physical Therapy

## 2014-10-22 VITALS — BP 146/93 | Ht 63.0 in | Wt 219.0 lb

## 2014-10-22 DIAGNOSIS — M25562 Pain in left knee: Secondary | ICD-10-CM

## 2014-10-22 DIAGNOSIS — Z471 Aftercare following joint replacement surgery: Secondary | ICD-10-CM | POA: Diagnosis not present

## 2014-10-22 DIAGNOSIS — Z96652 Presence of left artificial knee joint: Secondary | ICD-10-CM

## 2014-10-22 DIAGNOSIS — R269 Unspecified abnormalities of gait and mobility: Secondary | ICD-10-CM

## 2014-10-22 NOTE — Progress Notes (Signed)
Total Knee Follow-up  Chief Complaint  Patient presents with  . Wound Check    follow up left TKA DOS 09/09/14    Patient here for 6 week post left total knee arthroplasty follow-up. The patient is not having any pain. The patient denies  fever, wound drainage, increasing redness, pus, increasing pain, increasing swelling. Post op problems reported:  none She is ambulating with no assistive devices  Her knee incision looks good her range of motion is reported as 5-112 but it seems 0 to 115 to me.  Return 5 weeks.

## 2014-10-22 NOTE — Therapy (Signed)
[place], [place], [address] Phone: [phone]   Fax:  [phone]  Physical Therapy Treatment  Patient Details  Name: Kimberly [name] MRN: [id number] Date of Birth: 1950-10-02 Referring Provider:  Carole Civil, MD  Encounter Date: 10/22/2014      PT End of Session - 10/22/14 1430    Visit Number 9   Number of Visits 18   Date for PT Re-Evaluation 11/17/14   Authorization Type UMR   Authorization Time Period 09/30/14-11/29/14   Authorization - Visit Number 9   Authorization - Number of Visits 24   PT Start Time 5329   PT Stop Time 1429   PT Time Calculation (min) 44 min   Activity Tolerance Patient tolerated treatment well   Behavior During Therapy [facility] for tasks assessed/performed      Past Medical History  Diagnosis Date  . Diabetes mellitus   . High blood pressure   . Hypothyroid   . Arthritis   . Obesity   . Depression   . PONV (postoperative nausea and vomiting)     Past Surgical History  Procedure Laterality Date  . Carpal tunnel release Right   . Thyroid surgery      removal  . Abdominal hysterectomy    . Bunions Right   . Knee arthroscopy Right   . Total knee arthroplasty Right 08/25/2013    Procedure: TOTAL KNEE ARTHROPLASTY;  Surgeon: Carole Civil, MD;  Location: [facility];  Service: Orthopedics;  Laterality: Right;  . Total knee arthroplasty Left 09/09/2014    Procedure: LEFT TOTAL KNEE ARTHROPLASTY;  Surgeon: Carole Civil, MD;  Location: [facility];  Service: Orthopedics;  Laterality: Left;    There were no vitals taken for this visit.  Visit Diagnosis:  Status post total left knee replacement  Abnormality of gait  Pain in joint, lower leg, left      Subjective Assessment - 10/22/14 1354    Symptoms No pain however patient states that she is feeling a little fatigued today since she has been walking around most of the morning with her to-do list   Currently  in Pain? Yes   Pain Score 1    Pain Location Knee   Pain Orientation Left                    [facility] Adult PT Treatment/Exercise - 10/22/14 0001    Knee/Hip Exercises: Stretches   Active Hamstring Stretch 3 reps;30 seconds   Active Hamstring Stretch Limitations 14" box   Hip Flexor Stretch 2 reps;30 seconds   Hip Flexor Stretch Limitations standing   Piriformis Stretch 2 reps;30 seconds   Piriformis Stretch Limitations seated   Gastroc Stretch 3 reps;30 seconds   Gastroc Stretch Limitations slantboard   Knee/Hip Exercises: Standing   Heel Raises 1 set;10 reps   Heel Raises Limitations slantboard   Forward Lunges Both;1 set;10 reps   Forward Lunges Limitations 2 inch box   Side Lunges Both;1 set;10 reps   Side Lunges Limitations 2 inch box   Other Standing Knee Exercises --   Knee/Hip Exercises: Supine   Straight Leg Raises Both;1 set;10 reps   Knee/Hip Exercises: Sidelying   Hip ABduction Both;1 set;10 reps   Knee/Hip Exercises: Prone   Other Prone Exercises Prone hip extensions 1x10 bilaterally             Balance Exercises - 10/22/14 1429    Balance Exercises: Standing  Standing Eyes Closed Narrow base of support (BOS);Other (comment)  foot up on 4" box and SLS with eyes closed             PT Short Term Goals - 10/19/14 1153    PT SHORT TERM GOAL #1   Title Patient will c/o no more than 2/10 pain L knee during upright functional weight bearing tasks of at least 90 minutes in duration   Time 3   Period Weeks   Status Achieved   PT SHORT TERM GOAL #2   Title Patient will demonstrate at least 4-/10 strength in B hip musculature, including flexors, ABD, and extensors in order to reduce pain and increase knee stability    Baseline See session note for specific measures   Time 3   Period Weeks   Status On-going   PT SHORT TERM GOAL #3   Title Patient will demonstrate L knee range of at least -5 degrees knee extension and 105 degrees L knee flexion  in order to enhance gait and overall pain free mobility   Time 3   Period Weeks   Status Achieved   PT SHORT TERM GOAL #4   Title Patient will demonstrate the ability to ascend/descent at least 6 average height steps with U railing and only occasional cues from therapist, pain no more than 2/10 throughout   Baseline Continues to demonstrate biomechanical impairments with stair climbing   Time 3   Period Weeks   Status On-going           PT Long Term Goals - 10/19/14 1154    PT LONG TERM GOAL #1   Title Patient will demonstrate the ability to participate in upright weight bearing tasks for at least 4 hours with no more than 1/10 pain L knee   Time 6   Period Weeks   Status On-going   PT LONG TERM GOAL #2   Title Patient will demonstrate the ability to ascend/descent full flight of average height steps with no railings, good safety and sequencing, and no cues from therapist, no more than 1/10 pain L knee throughout   Time 6   Period Weeks   Status On-going   PT LONG TERM GOAL #3   Title Patient will demonstrate the ability to ambulate over unsteady surfaces such as grass or gravel without AD, good balance recovery skills, and pain no more than 1/10   Time 6   Period Weeks   Status On-going   PT LONG TERM GOAL #4   Title Patient will demonstrate the ability to consistently and correctly adhere to appropriate HEP in order to enhance function and gains made during skilled PT services   Period Weeks   Status On-going   PT LONG TERM GOAL #5   Title Patient will demonstrate L knee range of 0 degrees extension and at least 115 degrees flexion in order to facilitate enhanced mobility and improved mechanics    Time 6   Period Weeks   Status On-going               Plan - 10/22/14 1433    Clinical Impression Statement Patient continues to tolerate progression of functional exercises well; per patient, her MD states that she is progressing well. Also continued table exercises to  continue re-inforcing joint strength against gravity. In middle of side lunges patient did experience significant cramping of left quadricep and IT band/TFL and had to stop exercise; gentle massage performed along with stretching and pain did  resolve. Patient also stated that she had been dehydrated most of the day; this dehydration in combination with fatigue and active exercise may have combined to cause cramp. Pt education regarding gentlle massage and heat at home; pain in knee and affected leg in general 1/10 at end of session.    Pt will benefit from skilled therapeutic intervention in order to improve on the following deficits Abnormal gait;Decreased skin integrity;Decreased activity tolerance;Decreased strength;Impaired flexibility;Decreased balance;Decreased mobility;Difficulty walking;Decreased range of motion;Increased edema;Pain;Decreased coordination   Rehab Potential Good   PT Frequency 3x / week   PT Duration 6 weeks   PT Treatment/Interventions Gait training;Neuromuscular re-education;Stair training;Scar mobilization;Functional mobility training;Patient/family education;Passive range of motion;Cryotherapy;Therapeutic activities;Therapeutic exercise;Manual techniques;DME Instruction;Balance training   PT Next Visit Plan Continue progression of functional exercises; continue patellar mobs; balance training; continue to address limited hip IR; focus on hurdles and stair mechanics   Consulted and Agree with Plan of Care Patient        Problem List Patient Active Problem List   Diagnosis Date Noted  . Primary osteoarthritis of left knee   . Diabetes 08/12/2014  . Esophageal reflux 06/02/2014  . Gastritis and gastroduodenitis 06/02/2014  . S/P total knee replacement 10/28/2013  . Stiffness of right knee 09/17/2013  . S/P knee replacement 09/02/2013  . Unspecified constipation 09/02/2013  . Bilateral knee pain 08/07/2013  . Microproteinuria 07/16/2013  . Essential hypertension,  benign 07/06/2013  . Hypothyroidism 07/06/2013  . Medial meniscus, posterior horn derangement 01/19/2011  . OA (osteoarthritis) of knee 01/19/2011  . Mononeuritis leg 01/19/2011    Deniece Ree PT, DPT [place] [address] [place], [place], [address] Phone: [phone]   Fax:  [phone]

## 2014-10-23 ENCOUNTER — Encounter (HOSPITAL_COMMUNITY): Payer: 59

## 2014-10-26 ENCOUNTER — Ambulatory Visit (HOSPITAL_COMMUNITY): Payer: 59 | Admitting: Physical Therapy

## 2014-10-26 DIAGNOSIS — Z471 Aftercare following joint replacement surgery: Secondary | ICD-10-CM | POA: Diagnosis not present

## 2014-10-26 DIAGNOSIS — M25562 Pain in left knee: Secondary | ICD-10-CM

## 2014-10-26 DIAGNOSIS — Z96652 Presence of left artificial knee joint: Secondary | ICD-10-CM

## 2014-10-26 DIAGNOSIS — R269 Unspecified abnormalities of gait and mobility: Secondary | ICD-10-CM

## 2014-10-26 NOTE — Therapy (Signed)
[place], [place], [address] Phone: [phone]   Fax:  [phone]  Physical Therapy Treatment  Patient Details  Name: Kimberly [name] MRN: [id number] Date of Birth: 1951-06-13 Referring Provider:  Carole Civil, MD  Encounter Date: 10/26/2014      PT End of Session - 10/26/14 1350    Visit Number 10   Number of Visits 18   Date for PT Re-Evaluation 11/17/14   Authorization Type UMR   Authorization Time Period 09/30/14-11/29/14   Authorization - Visit Number 10   Authorization - Number of Visits 24   PT Start Time 7253   PT Stop Time 1344   PT Time Calculation (min) 41 min   Activity Tolerance Patient tolerated treatment well   Behavior During Therapy [facility] for tasks assessed/performed      Past Medical History  Diagnosis Date  . Diabetes mellitus   . High blood pressure   . Hypothyroid   . Arthritis   . Obesity   . Depression   . PONV (postoperative nausea and vomiting)     Past Surgical History  Procedure Laterality Date  . Carpal tunnel release Right   . Thyroid surgery      removal  . Abdominal hysterectomy    . Bunions Right   . Knee arthroscopy Right   . Total knee arthroplasty Right 08/25/2013    Procedure: TOTAL KNEE ARTHROPLASTY;  Surgeon: Carole Civil, MD;  Location: [facility];  Service: Orthopedics;  Laterality: Right;  . Total knee arthroplasty Left 09/09/2014    Procedure: LEFT TOTAL KNEE ARTHROPLASTY;  Surgeon: Carole Civil, MD;  Location: [facility];  Service: Orthopedics;  Laterality: Left;    There were no vitals taken for this visit.  Visit Diagnosis:  Status post total left knee replacement  Abnormality of gait  Pain in joint, lower leg, left      Subjective Assessment - 10/26/14 1301    Symptoms Patient states she is doing well today, no pain today; patient curious as to if this week could be her last week of therapy. States that the area of her leg that  had cramped last visit is fine now, no pain.    Pertinent History Patient had her R knee replaced approx one year ago, then began to have significant pain and difficulty with her L knee, resulting in another total knee replacement   Currently in Pain? No/denies                    [facility] Adult PT Treatment/Exercise - 10/26/14 0001    Ambulation/Gait   Gait Comments Stair training; reductions in circumduction and hip hiking noted, improved overall form; gait over uneven surfaces at fast and slow paces    Knee/Hip Exercises: Stretches   Active Hamstring Stretch 3 reps;30 seconds   Active Hamstring Stretch Limitations 14" box   Quad Stretch 3 reps   Quad Stretch Limitations 45 seconds each leg   Piriformis Stretch 2 reps;30 seconds   Piriformis Stretch Limitations seated   Gastroc Stretch 3 reps;30 seconds   Gastroc Stretch Limitations slantboard   Knee/Hip Exercises: Standing   Forward Lunges Both;1 set;10 reps   Forward Lunges Limitations BOSU   Step Down 1 set;15 reps   Step Down Limitations 8" box   Functional Squat 10 reps   Functional Squat Limitations BOSU   Rocker Board 4 minutes   Rocker Board Limitations 2 minutes anterior/posterior, 2 minutes  lateral; no upper extremity assist either way   Other Standing Knee Exercises Stepping over 12" hurdles; improvement in hip hiking and circumduction of bilateral LEs but remains present             Balance Exercises - 10/26/14 1340    Balance Exercises: Standing   SLS Eyes open;Foam/compliant surface;5 reps;10 secs           PT Education - 10/26/14 1336    Education provided Yes   Education Details Pt education regarding progress towards goals; education regarding plan to perform all treatments this week, take next week off, then follow up with one last session   Person(s) Educated Patient   Methods Explanation   Comprehension Verbalized understanding          PT Short Term Goals - 10/19/14 1153    PT  SHORT TERM GOAL #1   Title Patient will c/o no more than 2/10 pain L knee during upright functional weight bearing tasks of at least 90 minutes in duration   Time 3   Period Weeks   Status Achieved   PT SHORT TERM GOAL #2   Title Patient will demonstrate at least 4-/10 strength in B hip musculature, including flexors, ABD, and extensors in order to reduce pain and increase knee stability    Baseline See session note for specific measures   Time 3   Period Weeks   Status On-going   PT SHORT TERM GOAL #3   Title Patient will demonstrate L knee range of at least -5 degrees knee extension and 105 degrees L knee flexion in order to enhance gait and overall pain free mobility   Time 3   Period Weeks   Status Achieved   PT SHORT TERM GOAL #4   Title Patient will demonstrate the ability to ascend/descent at least 6 average height steps with U railing and only occasional cues from therapist, pain no more than 2/10 throughout   Baseline Continues to demonstrate biomechanical impairments with stair climbing   Time 3   Period Weeks   Status On-going           PT Long Term Goals - 10/19/14 1154    PT LONG TERM GOAL #1   Title Patient will demonstrate the ability to participate in upright weight bearing tasks for at least 4 hours with no more than 1/10 pain L knee   Time 6   Period Weeks   Status On-going   PT LONG TERM GOAL #2   Title Patient will demonstrate the ability to ascend/descent full flight of average height steps with no railings, good safety and sequencing, and no cues from therapist, no more than 1/10 pain L knee throughout   Time 6   Period Weeks   Status On-going   PT LONG TERM GOAL #3   Title Patient will demonstrate the ability to ambulate over unsteady surfaces such as grass or gravel without AD, good balance recovery skills, and pain no more than 1/10   Time 6   Period Weeks   Status On-going   PT LONG TERM GOAL #4   Title Patient will demonstrate the ability to  consistently and correctly adhere to appropriate HEP in order to enhance function and gains made during skilled PT services   Period Weeks   Status On-going   PT LONG TERM GOAL #5   Title Patient will demonstrate L knee range of 0 degrees extension and at least 115 degrees flexion in order to facilitate  enhanced mobility and improved mechanics    Time 6   Period Weeks   Status On-going               Plan - 10/26/14 1352    Clinical Impression Statement Discussed progress with patient; overall patient is making good progress towards goals and feels that she is ready to begin to taper off therapy. Reviewed goals with patient and she states, and therapist agrees, that main functional impairment is stair climbing at this time. Patient and therapist in agreement to complete all sessions this week, take next week off as patient will be out of town, and then have one session for the next week following. Patient demonstrates good ability to manage gait over unstable surfaces, shows improved stair skills, and good tolerance of all activities and of activities on unstable surfaces at this time, and is able to perform all functional tasks at home without difficulty.    Pt will benefit from skilled therapeutic intervention in order to improve on the following deficits Abnormal gait;Decreased skin integrity;Decreased activity tolerance;Decreased strength;Impaired flexibility;Decreased balance;Decreased mobility;Difficulty walking;Decreased range of motion;Increased edema;Pain;Decreased coordination   Rehab Potential Good   PT Frequency Other (comment)  2 more times this week; next week off; one last follow up visit following week   PT Duration Other (comment)  2 more times this week; next week off; one last follow up visit following week   PT Treatment/Interventions Gait training;Neuromuscular re-education;Stair training;Scar mobilization;Functional mobility training;Patient/family education;Passive  range of motion;Cryotherapy;Therapeutic activities;Therapeutic exercise;Manual techniques;DME Instruction;Balance training   PT Next Visit Plan Focus on stairs and hurdles, step downs, stretches, balance training        Problem List Patient Active Problem List   Diagnosis Date Noted  . Primary osteoarthritis of left knee   . Diabetes 08/12/2014  . Esophageal reflux 06/02/2014  . Gastritis and gastroduodenitis 06/02/2014  . S/P total knee replacement 10/28/2013  . Stiffness of right knee 09/17/2013  . S/P knee replacement 09/02/2013  . Unspecified constipation 09/02/2013  . Bilateral knee pain 08/07/2013  . Microproteinuria 07/16/2013  . Essential hypertension, benign 07/06/2013  . Hypothyroidism 07/06/2013  . Medial meniscus, posterior horn derangement 01/19/2011  . OA (osteoarthritis) of knee 01/19/2011  . Mononeuritis leg 01/19/2011    Deniece Ree PT, DPT [place] [address] [place], [place], [address] Phone: [phone]   Fax:  [phone]

## 2014-10-27 ENCOUNTER — Telehealth: Payer: Self-pay | Admitting: Orthopedic Surgery

## 2014-10-27 NOTE — Telephone Encounter (Signed)
Most recent notes faxed to short term disability insurer, [employer], as requested, to fax# [phone]. Signed authorization on file.  Patient aware.

## 2014-10-28 ENCOUNTER — Ambulatory Visit (HOSPITAL_COMMUNITY): Payer: 59 | Attending: Orthopedic Surgery | Admitting: Physical Therapy

## 2014-10-28 DIAGNOSIS — M25562 Pain in left knee: Secondary | ICD-10-CM

## 2014-10-28 DIAGNOSIS — Z96652 Presence of left artificial knee joint: Secondary | ICD-10-CM | POA: Diagnosis not present

## 2014-10-28 DIAGNOSIS — Z471 Aftercare following joint replacement surgery: Secondary | ICD-10-CM | POA: Insufficient documentation

## 2014-10-28 DIAGNOSIS — R269 Unspecified abnormalities of gait and mobility: Secondary | ICD-10-CM

## 2014-10-28 NOTE — Patient Instructions (Signed)
POSITION: Single Leg Balance: Neck Laterally Flexed / Stance Side FTa  Lift one leg off of ground by approximately 2-3 inches; maintain this stance for 30 seconds. Perform in hallway or kitchen counter where you have something to grab/hold onto if you lose your balance.  Repeat three times per set, 2 sets per day.     Right foot in front of left, heel touching toe both feet "straight ahead". Stand on Foot Triangle of Support with both feet. Balance in this position _30__ seconds. Do with left foot in front of right. Perform in hallway or at kitchen counter where you have something to hold onto. Repeat 3 times per set, 2 sets per day.   Copyright  VHI. All rights reserved.     Wrap long towel around ankle and use it to pull leg into stretch. CAUTION: Patient should feel stretch along front of thigh. There should be no pain in knee joint. Hold _30__ seconds. Repeat __2_ times. Repeat with other leg. Do __2_ sessions per day.   Copyright  VHI. All rights reserved.     http://ggbe.exer.us/15   Copyright  VHI. All rights reserved.  Bridge   Lie back, legs bent. Inhale, pressing hips up. Keeping ribs in, lengthen lower back. Exhale, rolling down along spine from top. Repeat __10__ times. Do __2__ sessions per day. If this is too easy, can cross one leg over the other (or by extending one leg) and bridge with one leg.   Copyright  VHI. All rights reserved.  Abduction Lift   Lie on side. Tighten muscles on outside of hip to raise sound limb off of table. Hold __2__ seconds. Repeat __10__ times, for 1-2 sets. Do __2__ sessions per day.  Copyright  VHI. All rights reserved.  (Home) Extension: Hip   With support under abdomen, tighten stomach. Lift right leg in line with body. Do not hyperextend. Alternate legs. Repeat _10___ times per set. Do _1-2___ sets per session. Do __2__ sessions per day.  Copyright  VHI. All rights reserved.

## 2014-10-28 NOTE — Therapy (Signed)
[place], [place], [address] Phone: [phone]   Fax:  [phone]  Physical Therapy Treatment  Patient Details  Name: Kimberly [name] MRN: [id number] Date of Birth: 1951/07/05 Referring Provider:  Carole Civil, MD  Encounter Date: 10/28/2014      PT End of Session - 10/28/14 1351    Visit Number 11   Number of Visits 18   Date for PT Re-Evaluation 11/09/14   Authorization Type UMR   Authorization Time Period 09/30/14-11/29/14   Authorization - Visit Number 11   Authorization - Number of Visits 24   PT Start Time 1300   PT Stop Time 1344   PT Time Calculation (min) 44 min   Activity Tolerance Patient tolerated treatment well   Behavior During Therapy [facility] for tasks assessed/performed      Past Medical History  Diagnosis Date  . Diabetes mellitus   . High blood pressure   . Hypothyroid   . Arthritis   . Obesity   . Depression   . PONV (postoperative nausea and vomiting)     Past Surgical History  Procedure Laterality Date  . Carpal tunnel release Right   . Thyroid surgery      removal  . Abdominal hysterectomy    . Bunions Right   . Knee arthroscopy Right   . Total knee arthroplasty Right 08/25/2013    Procedure: TOTAL KNEE ARTHROPLASTY;  Surgeon: Carole Civil, MD;  Location: [facility];  Service: Orthopedics;  Laterality: Right;  . Total knee arthroplasty Left 09/09/2014    Procedure: LEFT TOTAL KNEE ARTHROPLASTY;  Surgeon: Carole Civil, MD;  Location: [facility];  Service: Orthopedics;  Laterality: Left;    There were no vitals taken for this visit.  Visit Diagnosis:  Status post total left knee replacement  Abnormality of gait  Pain in joint, lower leg, left      Subjective Assessment - 10/28/14 1301    Symptoms Patient states she is doing well today, no pain; requesting advanced HEP   Pertinent History Patient had her R knee replaced approx one year ago, then began to  have significant pain and difficulty with her L knee, resulting in another total knee replacement                    [facility] Adult PT Treatment/Exercise - 10/28/14 0001    Ambulation/Gait   Gait Comments Stair training; reductions in circumduction and hip hiking noted, improved overall form; navigation of 12 inch hurdles with some circumduction remaining   Knee/Hip Exercises: Stretches   Active Hamstring Stretch 3 reps;30 seconds   Active Hamstring Stretch Limitations 14" box, 3-way   Quad Stretch 2 reps;30 seconds   Quad Stretch Limitations prone with rope   Piriformis Stretch 2 reps;30 seconds   Piriformis Stretch Limitations seated   Gastroc Stretch 3 reps;30 seconds   Gastroc Stretch Limitations slantboard   Knee/Hip Exercises: Standing   Heel Raises 1 set;15 reps   Heel Raises Limitations slantboard   Forward Step Up Left;10 reps;Step Height: 8"   Forward Step Up Limitations Pain at end of activity   Functional Squat 10 reps   Functional Squat Limitations BOSU   Rocker Board 4 minutes   Rocker Board Limitations 2 minutes anterior/posterior; 2 minutes lateral   Knee/Hip Exercises: Supine   Bridges 1 set;10 reps   Knee/Hip Exercises: Sidelying   Hip ABduction Both;1 set;10 reps   Knee/Hip Exercises: Prone  Other Prone Exercises Prone hip extensions 1x10 bilaterally             Balance Exercises - 10/28/14 1331    Balance Exercises: Standing   Tandem Stance Eyes open;1 rep;30 secs   SLS Eyes open;Solid surface;1 rep;30 secs           PT Education - 10/28/14 1340    Education provided Yes   Education Details Updated HEP    Person(s) Educated Patient   Methods Explanation   Comprehension Verbalized understanding;Returned demonstration          PT Short Term Goals - 10/19/14 1153    PT SHORT TERM GOAL #1   Title Patient will c/o no more than 2/10 pain L knee during upright functional weight bearing tasks of at least 90 minutes in duration   Time  3   Period Weeks   Status Achieved   PT SHORT TERM GOAL #2   Title Patient will demonstrate at least 4-/10 strength in B hip musculature, including flexors, ABD, and extensors in order to reduce pain and increase knee stability    Baseline See session note for specific measures   Time 3   Period Weeks   Status On-going   PT SHORT TERM GOAL #3   Title Patient will demonstrate L knee range of at least -5 degrees knee extension and 105 degrees L knee flexion in order to enhance gait and overall pain free mobility   Time 3   Period Weeks   Status Achieved   PT SHORT TERM GOAL #4   Title Patient will demonstrate the ability to ascend/descent at least 6 average height steps with U railing and only occasional cues from therapist, pain no more than 2/10 throughout   Baseline Continues to demonstrate biomechanical impairments with stair climbing   Time 3   Period Weeks   Status On-going           PT Long Term Goals - 10/19/14 1154    PT LONG TERM GOAL #1   Title Patient will demonstrate the ability to participate in upright weight bearing tasks for at least 4 hours with no more than 1/10 pain L knee   Time 6   Period Weeks   Status On-going   PT LONG TERM GOAL #2   Title Patient will demonstrate the ability to ascend/descent full flight of average height steps with no railings, good safety and sequencing, and no cues from therapist, no more than 1/10 pain L knee throughout   Time 6   Period Weeks   Status On-going   PT LONG TERM GOAL #3   Title Patient will demonstrate the ability to ambulate over unsteady surfaces such as grass or gravel without AD, good balance recovery skills, and pain no more than 1/10   Time 6   Period Weeks   Status On-going   PT LONG TERM GOAL #4   Title Patient will demonstrate the ability to consistently and correctly adhere to appropriate HEP in order to enhance function and gains made during skilled PT services   Period Weeks   Status On-going   PT  LONG TERM GOAL #5   Title Patient will demonstrate L knee range of 0 degrees extension and at least 115 degrees flexion in order to facilitate enhanced mobility and improved mechanics    Time 6   Period Weeks   Status On-going               Plan - 10/28/14  1352    Clinical Impression Statement Updated HEP at this time. Patient is still able to do everything she needs and wants to do at home. Focus on balance and functional strength, stairs and hurdles at this time. Some knee soreness with elevated step ups onto 8" step. Continues to display weakness in hip extensors and abductors in gravity eliminated position. Improved stair climbing mechanics. One more visit this week, next week off, possible DC after followup visit following week.    Pt will benefit from skilled therapeutic intervention in order to improve on the following deficits Abnormal gait;Decreased skin integrity;Decreased activity tolerance;Decreased strength;Impaired flexibility;Decreased balance;Decreased mobility;Difficulty walking;Decreased range of motion;Increased edema;Pain;Decreased coordination   Rehab Potential Good   PT Frequency Other (comment)  one more visit this week, one week off, one follow up visit   PT Duration Other (comment)  one more visit this week, next week off, one follow up visit   PT Treatment/Interventions Gait training;Neuromuscular re-education;Stair training;Scar mobilization;Functional mobility training;Patient/family education;Passive range of motion;Cryotherapy;Therapeutic activities;Therapeutic exercise;Manual techniques;DME Instruction;Balance training   PT Next Visit Plan Focus on stairs and hurdles, step downs, stretches, balance training; possible early DC if patient continues to do well    Consulted and Agree with Plan of Care Patient        Problem List Patient Active Problem List   Diagnosis Date Noted  . Primary osteoarthritis of left knee   . Diabetes 08/12/2014  . Esophageal  reflux 06/02/2014  . Gastritis and gastroduodenitis 06/02/2014  . S/P total knee replacement 10/28/2013  . Stiffness of right knee 09/17/2013  . S/P knee replacement 09/02/2013  . Unspecified constipation 09/02/2013  . Bilateral knee pain 08/07/2013  . Microproteinuria 07/16/2013  . Essential hypertension, benign 07/06/2013  . Hypothyroidism 07/06/2013  . Medial meniscus, posterior horn derangement 01/19/2011  . OA (osteoarthritis) of knee 01/19/2011  . Mononeuritis leg 01/19/2011    Deniece Ree PT, DPT [place] [address] [place], [place], [address] Phone: [phone]   Fax:  [phone]

## 2014-10-30 ENCOUNTER — Ambulatory Visit (HOSPITAL_COMMUNITY): Payer: 59

## 2014-11-02 ENCOUNTER — Other Ambulatory Visit: Payer: Self-pay | Admitting: *Deleted

## 2014-11-02 ENCOUNTER — Encounter (HOSPITAL_COMMUNITY): Payer: 59 | Admitting: Physical Therapy

## 2014-11-02 MED ORDER — LEVOTHYROXINE SODIUM 137 MCG PO TABS: 137.0000 ug | ORAL_TABLET | Freq: Every day | ORAL | Status: DC

## 2014-11-04 ENCOUNTER — Encounter (HOSPITAL_COMMUNITY): Payer: 59

## 2014-11-06 ENCOUNTER — Encounter (HOSPITAL_COMMUNITY): Payer: 59 | Admitting: Physical Therapy

## 2014-11-11 ENCOUNTER — Encounter: Payer: Self-pay | Admitting: Family Medicine

## 2014-11-11 ENCOUNTER — Telehealth: Payer: Self-pay

## 2014-11-11 ENCOUNTER — Other Ambulatory Visit: Payer: Self-pay | Admitting: *Deleted

## 2014-11-11 ENCOUNTER — Ambulatory Visit (INDEPENDENT_AMBULATORY_CARE_PROVIDER_SITE_OTHER): Payer: 59 | Admitting: Family Medicine

## 2014-11-11 ENCOUNTER — Ambulatory Visit (HOSPITAL_COMMUNITY): Payer: 59 | Admitting: Physical Therapy

## 2014-11-11 VITALS — BP 132/78 | Ht 63.0 in | Wt 211.5 lb

## 2014-11-11 DIAGNOSIS — M25562 Pain in left knee: Secondary | ICD-10-CM

## 2014-11-11 DIAGNOSIS — Z471 Aftercare following joint replacement surgery: Secondary | ICD-10-CM | POA: Diagnosis not present

## 2014-11-11 DIAGNOSIS — Z96652 Presence of left artificial knee joint: Secondary | ICD-10-CM

## 2014-11-11 DIAGNOSIS — E119 Type 2 diabetes mellitus without complications: Secondary | ICD-10-CM

## 2014-11-11 DIAGNOSIS — E039 Hypothyroidism, unspecified: Secondary | ICD-10-CM

## 2014-11-11 DIAGNOSIS — R269 Unspecified abnormalities of gait and mobility: Secondary | ICD-10-CM

## 2014-11-11 LAB — POCT GLYCOSYLATED HEMOGLOBIN (HGB A1C): Hemoglobin A1C: 6.6

## 2014-11-11 MED ORDER — LOSARTAN POTASSIUM 100 MG PO TABS: ORAL_TABLET | ORAL | Status: DC

## 2014-11-11 MED ORDER — VENLAFAXINE HCL ER 150 MG PO CP24: ORAL_CAPSULE | ORAL | Status: DC

## 2014-11-11 MED ORDER — METFORMIN HCL 500 MG PO TABS: 500.0000 mg | ORAL_TABLET | Freq: Two times a day (BID) | ORAL | Status: DC

## 2014-11-11 MED ORDER — DICLOFENAC SODIUM 75 MG PO TBEC: 75.0000 mg | DELAYED_RELEASE_TABLET | Freq: Two times a day (BID) | ORAL | Status: DC

## 2014-11-11 MED ORDER — HYDROCHLOROTHIAZIDE 25 MG PO TABS: 25.0000 mg | ORAL_TABLET | Freq: Every day | ORAL | Status: DC

## 2014-11-11 MED ORDER — LEVOTHYROXINE SODIUM 137 MCG PO TABS: 137.0000 ug | ORAL_TABLET | Freq: Every day | ORAL | Status: DC

## 2014-11-11 MED ORDER — METOPROLOL SUCCINATE ER 50 MG PO TB24: 50.0000 mg | ORAL_TABLET | Freq: Every day | ORAL | Status: DC

## 2014-11-11 NOTE — Telephone Encounter (Signed)
Ok lets do 

## 2014-11-11 NOTE — Therapy (Signed)
[place], [place], [address] Phone: [phone]   Fax:  [phone]  Physical Therapy Treatment  Patient Details  Name: Kimberly [name] MRN: [id number] Date of Birth: 1950/09/05 Referring Provider:  Mikey Kirschner, MD  Encounter Date: 11/11/2014      PT End of Session - 11/11/14 1438    Visit Number 12   Number of Visits 18   Date for PT Re-Evaluation 11/09/14   Authorization Type UMR   Authorization Time Period 09/30/14-11/29/14   Authorization - Visit Number 12   Authorization - Number of Visits 24   Activity Tolerance Patient tolerated treatment well   Behavior During Therapy [facility] for tasks assessed/performed      Past Medical History  Diagnosis Date  . Diabetes mellitus   . High blood pressure   . Hypothyroid   . Arthritis   . Obesity   . Depression   . PONV (postoperative nausea and vomiting)     Past Surgical History  Procedure Laterality Date  . Carpal tunnel release Right   . Thyroid surgery      removal  . Abdominal hysterectomy    . Bunions Right   . Knee arthroscopy Right   . Total knee arthroplasty Right 08/25/2013    Procedure: TOTAL KNEE ARTHROPLASTY;  Surgeon: Carole Civil, MD;  Location: [facility];  Service: Orthopedics;  Laterality: Right;  . Total knee arthroplasty Left 09/09/2014    Procedure: LEFT TOTAL KNEE ARTHROPLASTY;  Surgeon: Carole Civil, MD;  Location: [facility];  Service: Orthopedics;  Laterality: Left;    There were no vitals filed for this visit.  Visit Diagnosis:  Status post total left knee replacement  Abnormality of gait  Pain in joint, lower leg, left      Subjective Assessment - 11/11/14 1352    Symptoms Patient states she is doing very well today, had no problems during her week off and requests that today be the last day of therapy.    Pertinent History Patient had her R knee replaced approx one year ago, then began to have significant  pain and difficulty with her L knee, resulting in another total knee replacement   How long can you sit comfortably? unlimited   How long can you stand comfortably? 3/16- 4 hours   How long can you walk comfortably? 3/16- 3 hours approximately   Currently in Pain? No/denies            [facility] PT Assessment - 11/11/14 0001    Observation/Other Assessments   Focus on Therapeutic Outcomes (FOTO)  20% limited   AROM   Right Hip External Rotation  48   Right Hip Internal Rotation  22   Left Hip External Rotation  46   Left Hip Internal Rotation  30   Left Knee Extension 2  two degrees from zero   Left Knee Flexion 114   Right Ankle Dorsiflexion 20   Left Ankle Dorsiflexion 10   Strength   Right Hip Flexion 4-/5   Right Hip Extension 4/5   Right Hip ABduction 4/5   Left Hip Flexion 4-/5   Left Hip Extension 4/5   Left Hip ABduction 4/5   Right Knee Flexion 5/5   Right Knee Extension 5/5   Left Knee Flexion 4+/5   Left Knee Extension 4+/5   Right Ankle Dorsiflexion 5/5   Left Ankle Dorsiflexion 5/5  [facility] PT Treatment/Exercise - 11/11/14 0001    Ambulation/Gait   Gait Comments Side-stepping x165f for hip ABD strength   Knee/Hip Exercises: Stretches   Active Hamstring Stretch 3 reps;30 seconds   Active Hamstring Stretch Limitations 14" box, 3-way   Gastroc Stretch 3 reps;30 seconds   Gastroc Stretch Limitations slantboard   Knee/Hip Exercises: Standing   Other Standing Knee Exercises Hip IR walk    Other Standing Knee Exercises Star reaches 1x10 each side                PT Education - 11/11/14 1419    Education provided Yes   Education Details Patient agreeable to DC; aware that if she requires skilled PT services she will need MD referral   Person(s) Educated Patient   Methods Explanation   Comprehension Verbalized understanding          PT Short Term Goals - 11/11/14 1434    PT SHORT TERM GOAL #1   Title Patient will  c/o no more than 2/10 pain L knee during upright functional weight bearing tasks of at least 90 minutes in duration   Time 3   Period Weeks   Status Achieved   PT SHORT TERM GOAL #2   Title Patient will demonstrate at least 4-/10 strength in B hip musculature, including flexors, ABD, and extensors in order to reduce pain and increase knee stability    Baseline See session note for specific measures   Time 3   Period Weeks   Status Achieved   PT SHORT TERM GOAL #3   Title Patient will demonstrate L knee range of at least -5 degrees knee extension and 105 degrees L knee flexion in order to enhance gait and overall pain free mobility   Time 3   Period Weeks   Status Achieved   PT SHORT TERM GOAL #4   Title Patient will demonstrate the ability to ascend/descent at least 6 average height steps with U railing and only occasional cues from therapist, pain no more than 2/10 throughout   Baseline 3/16- greatly improved mechanics   Time 3   Period Weeks   Status Achieved           PT Long Term Goals - 11/11/14 1435    PT LONG TERM GOAL #1   Title Patient will demonstrate the ability to participate in upright weight bearing tasks for at least 4 hours with no more than 1/10 pain L knee   Time 6   Period Weeks   Status Achieved   PT LONG TERM GOAL #2   Title Patient will demonstrate the ability to ascend/descent full flight of average height steps with no railings, good safety and sequencing, and no cues from therapist, no more than 1/10 pain L knee throughout   Baseline 3/16- patient able to climb full flight but always uses railing on stairs for safety   Time 6   Period Weeks   Status Partially Met   PT LONG TERM GOAL #3   Title Patient will demonstrate the ability to ambulate over unsteady surfaces such as grass or gravel without AD, good balance recovery skills, and pain no more than 1/10   Time 6   Period Weeks   Status Achieved   PT LONG TERM GOAL #4   Title Patient will  demonstrate the ability to consistently and correctly adhere to appropriate HEP in order to enhance function and gains made during skilled PT services   Time 6  Period Weeks   Status Achieved   PT LONG TERM GOAL #5   Title Patient will demonstrate L knee range of 0 degrees extension and at least 115 degrees flexion in order to facilitate enhanced mobility and improved mechanics    Baseline 3/16- 2 degrees from full extension, 114 degrees flexion   Time 6   Period Weeks   Status On-going               Plan - 11/11/14 1438    Clinical Impression Statement Re-assessment performed this date. Patient continues to demonstrate L ankle and bilateral hip tightness but has otherwise made tremendous gains in her strength and gait mechanics, and has met the vast majority of her functional goals. Patient requests that today be her last day of skilled PT services and is very motivated to consistently keep up with her HEP; DC today.    Pt will benefit from skilled therapeutic intervention in order to improve on the following deficits Abnormal gait;Decreased skin integrity;Decreased activity tolerance;Decreased strength;Impaired flexibility;Decreased balance;Decreased mobility;Difficulty walking;Decreased range of motion;Increased edema;Pain;Decreased coordination   Rehab Potential Good   PT Frequency Other (comment)  DC today   PT Duration Other (comment)  DC today   PT Next Visit Plan DC today   Consulted and Agree with Plan of Care Patient        Problem List Patient Active Problem List   Diagnosis Date Noted  . Primary osteoarthritis of left knee   . Diabetes 08/12/2014  . Esophageal reflux 06/02/2014  . Gastritis and gastroduodenitis 06/02/2014  . S/P total knee replacement 10/28/2013  . Stiffness of right knee 09/17/2013  . S/P knee replacement 09/02/2013  . Unspecified constipation 09/02/2013  . Bilateral knee pain 08/07/2013  . Microproteinuria 07/16/2013  . Essential  hypertension, benign 07/06/2013  . Hypothyroidism 07/06/2013  . Medial meniscus, posterior horn derangement 01/19/2011  . OA (osteoarthritis) of knee 01/19/2011  . Mononeuritis leg 01/19/2011    PHYSICAL THERAPY DISCHARGE SUMMARY  Visits from Start of Care: 12  Current functional level related to goals / functional outcomes: Patient continues to demonstrate limited range in L ankle dorsiflexion and bilateral hip internal rotation as well as some reduced flexion range of motion L knee; otherwise has met all functional goals. Patient did request that today be her last day of skilled PT Services as she feels that she has met her personal goals and is very happy with her functional status, feels that she has very few limitations at this time. Please see status for all goals in above note.   Remaining deficits: Limited L ankle, bilateral hip internal rotation, and L knee flexion range   Education / Equipment: Education regarding management of HEP; patient informed that she will need a new referral from MD to return to skilled PT services Plan: Patient agrees to discharge.  Patient goals were partially met. Patient is being discharged due to being pleased with the current functional level.  ?????        Deniece Ree PT, DPT [facility] [address] [place], [place], [address] Phone: [phone]   Fax:  [phone]

## 2014-11-11 NOTE — Telephone Encounter (Signed)
Patient states she needs a refill on her diclofenac to [facility] for a 90 day supply. Ok to left message at home number per patient.

## 2014-11-11 NOTE — Progress Notes (Signed)
   Subjective:    Patient ID: Kimberly [name], female    DOB: Feb 26, 1951, 64 y.o.   MRN: [id number]  Diabetes She presents for her follow-up diabetic visit. She has type 2 diabetes mellitus. Her disease course has been stable. There are no hypoglycemic associated symptoms. There are no diabetic associated symptoms. There are no hypoglycemic complications. Symptoms are stable. There are no diabetic complications. There are no known risk factors for coronary artery disease. Current diabetic treatment includes oral agent (monotherapy). She is compliant with treatment all of the time.   Patient states that she wants to see a cardiologist for her blood pressure issues. Compliant with blood pressure medication. Did not start the verapamil.  Reports that reflux is stable on current medications.  Watching diet. Trying to exercise but compromised by recent knee surgery.  Compliant with thyroid medication. Please see prior note. We need to check another TSH.  Results for orders placed or performed in visit on 11/11/14  POCT glycosylated hemoglobin (Hb A1C)  Result Value Ref Range   Hemoglobin A1C 6.6    Sp knee replacement, trying to get bck   Sugars running a little higher, watching fruit   Review of Systems No headache no chest pain no back pain no abdominal pain no change in bowel habits    Objective:   Physical Exam  Alert vitals stable. HEENT normal. Lungs clear. Heart regular rhythm. Ankles without edema. Sensation C diabetic foot exam      Assessment & Plan:  Impression 1 type 2 diabetes good control #2 hypothyroidism status uncertain #3 reflux stable #4 hypertension decent control. Plan patient states she is a friend of one of the cardiologist and plans to start seen him for blood pressure management. Maintain same diabetes medicine. Check TSH. Recheck in 6 months diet exercise discussed. WSL

## 2014-11-11 NOTE — Telephone Encounter (Signed)
Med sent to pharm. Pt notified on voicemail.  

## 2014-11-12 LAB — TSH: TSH: 3.79 u[IU]/mL (ref 0.450–4.500)

## 2014-11-15 ENCOUNTER — Encounter: Payer: Self-pay | Admitting: Family Medicine

## 2014-11-30 ENCOUNTER — Ambulatory Visit (INDEPENDENT_AMBULATORY_CARE_PROVIDER_SITE_OTHER): Payer: 59 | Admitting: Orthopedic Surgery

## 2014-11-30 ENCOUNTER — Encounter: Payer: Self-pay | Admitting: Orthopedic Surgery

## 2014-11-30 VITALS — BP 156/74 | Ht 63.0 in | Wt 211.5 lb

## 2014-11-30 DIAGNOSIS — Z96653 Presence of artificial knee joint, bilateral: Secondary | ICD-10-CM

## 2014-11-30 NOTE — Progress Notes (Signed)
Postop visit  Chief Complaint  Patient presents with  . Follow-up    5 week follow up Left TKA, DOS 09/09/14    Most recently left total knee arthroplasty had his right total knee arthroplasty. She says everything is good she's doing well she has no problems she's doing gardening and she is ready to go back to work  She has full extension of her knee her incision is clean, she has good stability she is walking without a cane no limp  I'll see her in January 2017 for bilateral total knee x-rays and will put her on an annual schedule for x-rays of both knees in January

## 2015-04-24 IMAGING — MG MM DIGITAL SCREENING
4 series · 4 of 4 positions shown · non-contrast
Comparison: Previous exam(s).

CLINICAL DATA: Screening.

EXAM:
DIGITAL SCREENING BILATERAL MAMMOGRAM WITH CAD

[L CC]
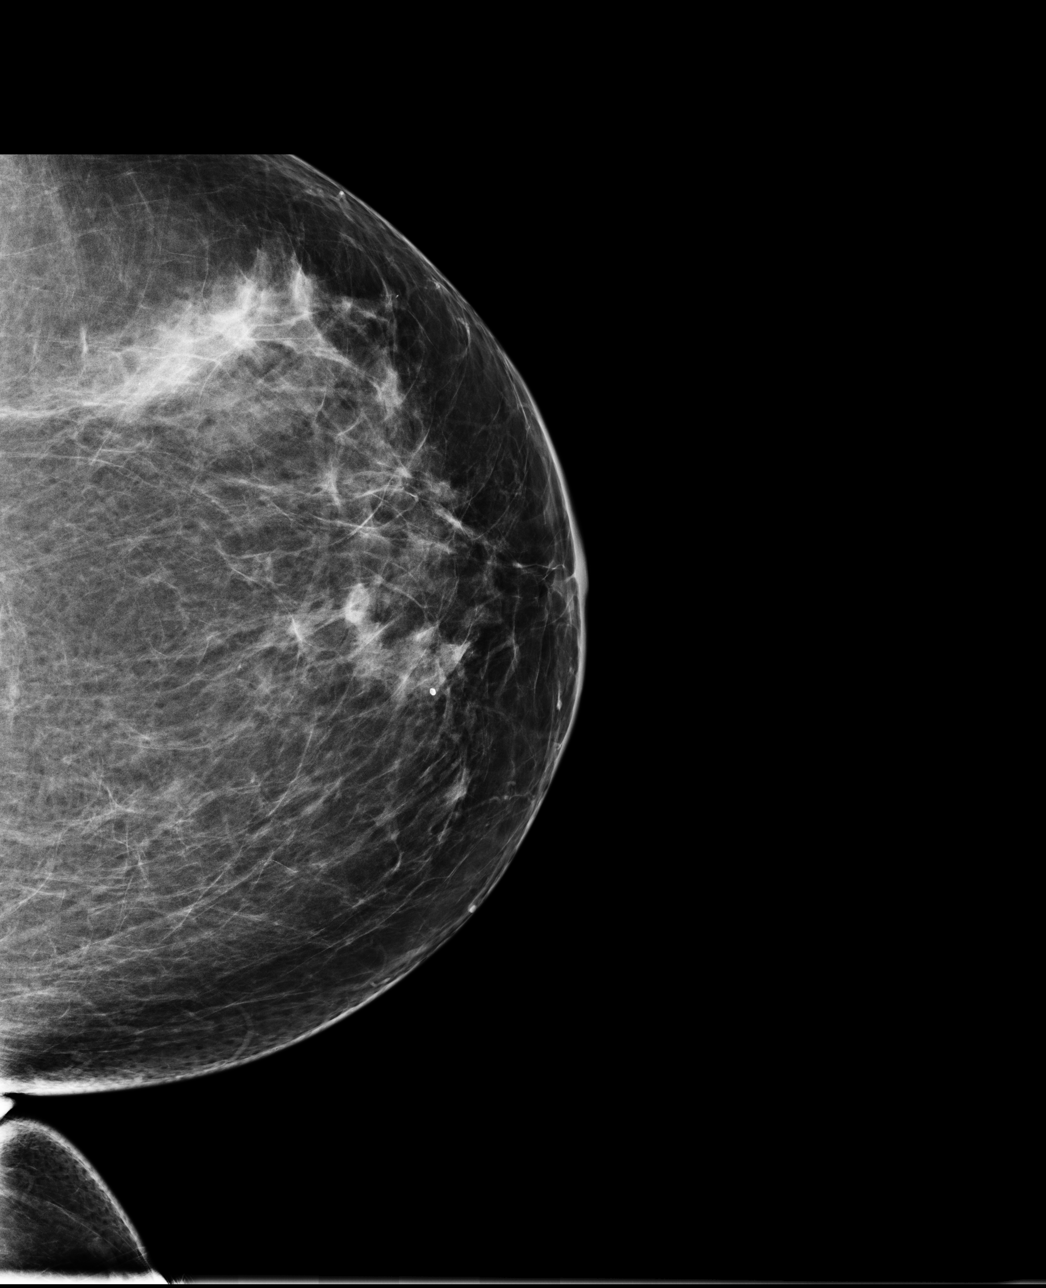

[L MLO]
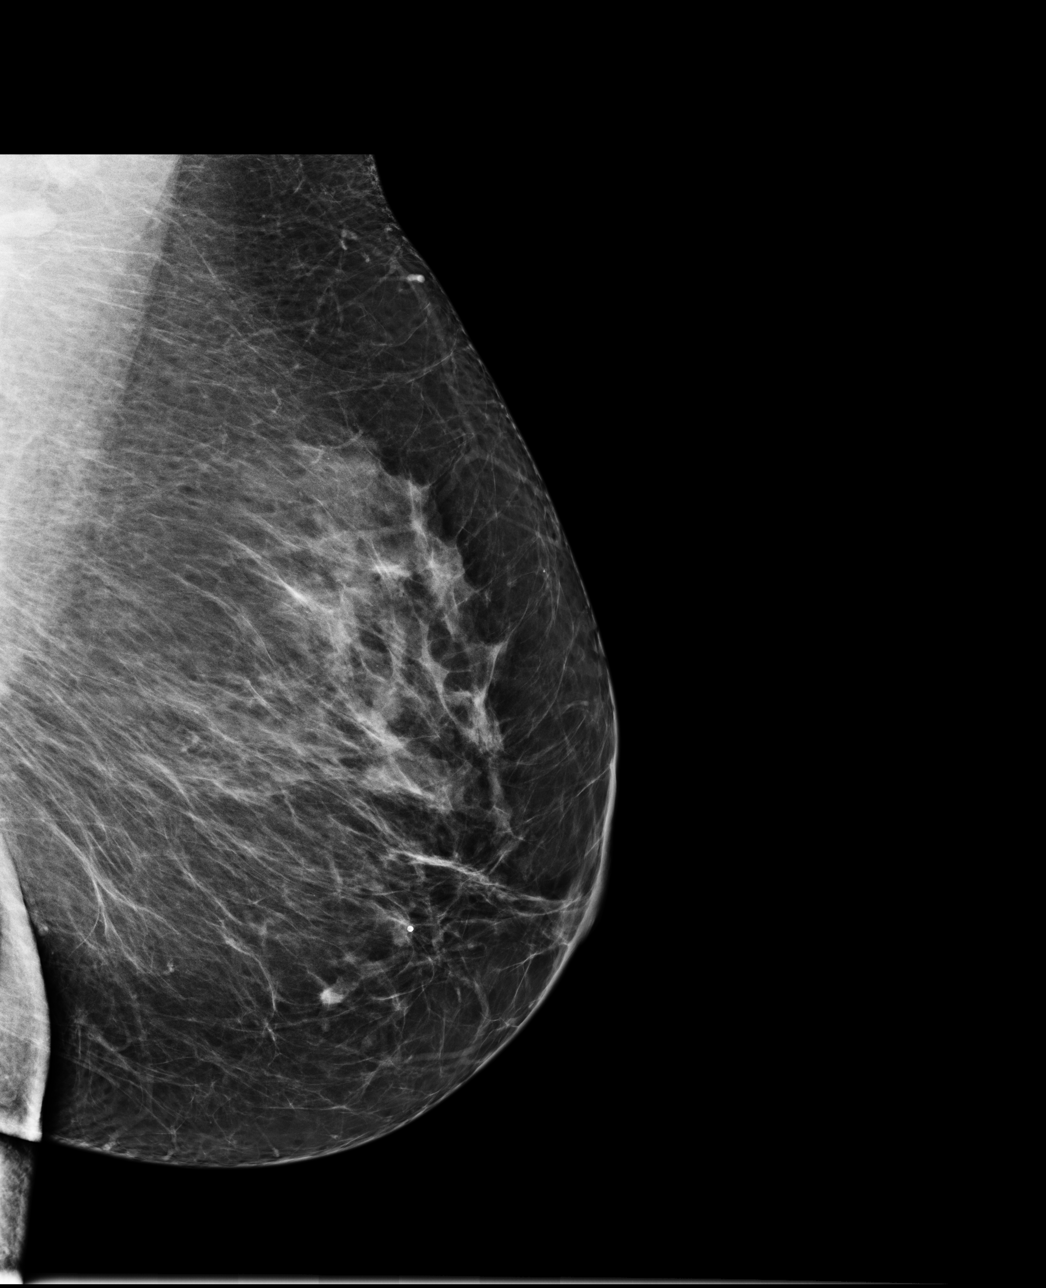

[R CC]
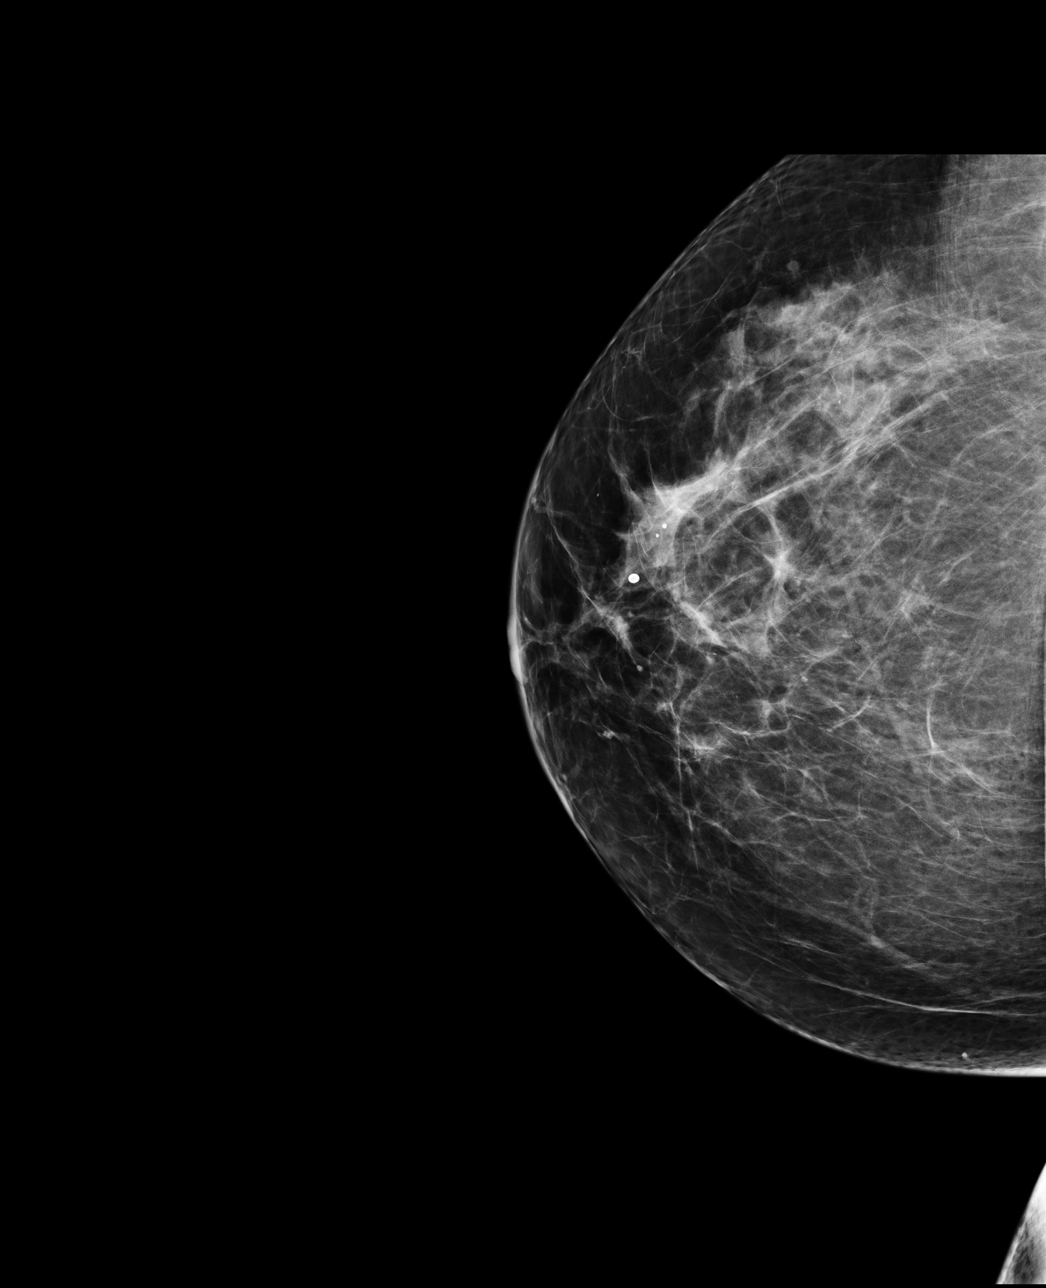

[R MLO]
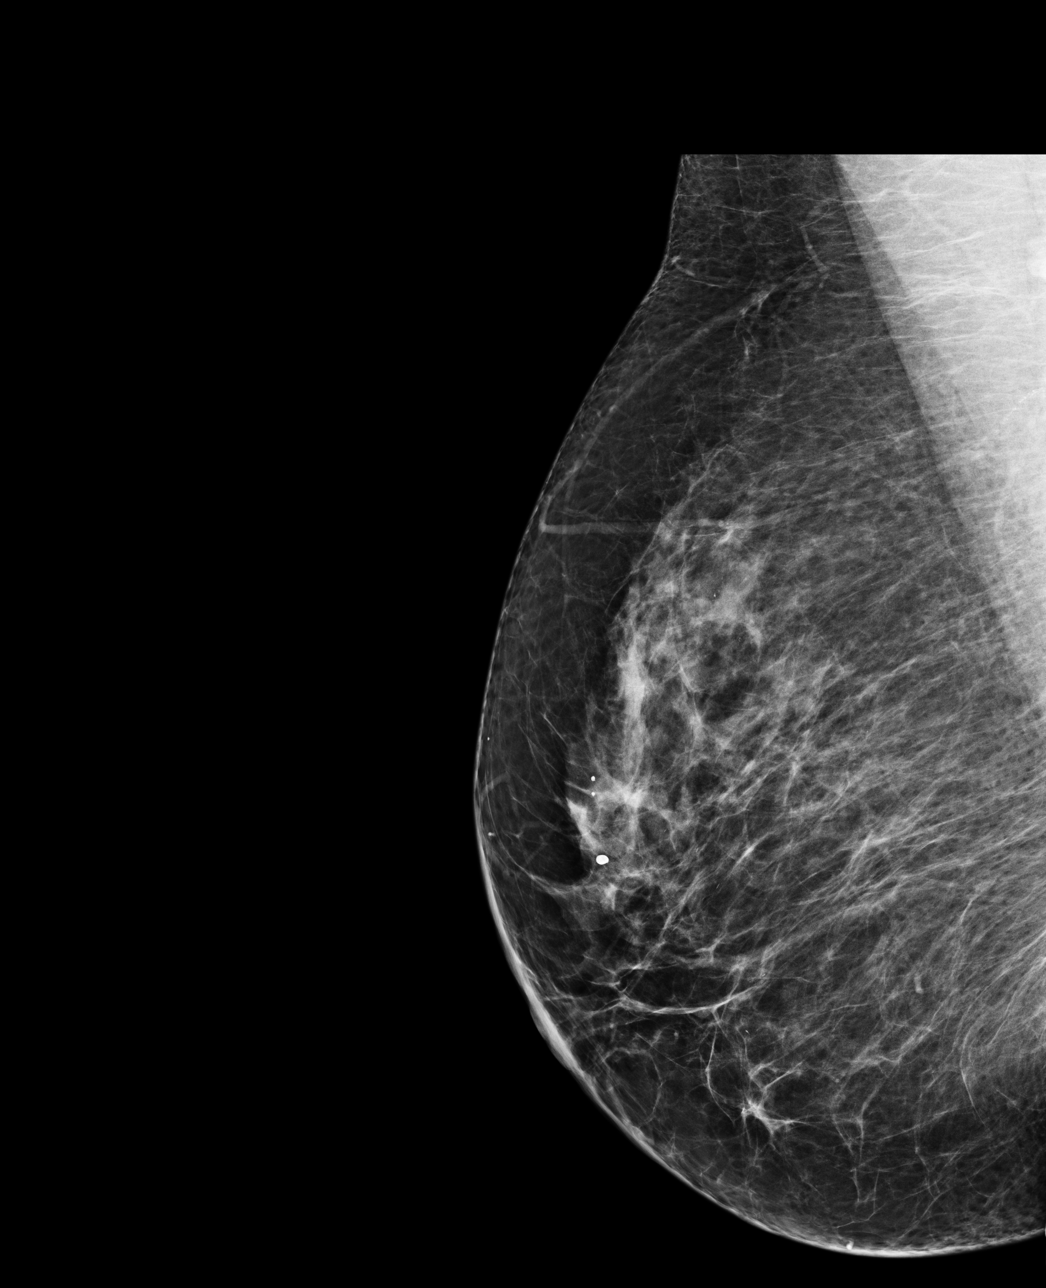

[4 of 4 positions shown; findings below may reference images not displayed]

ACR Breast Density Category b: There are scattered areas of
fibroglandular density.
FINDINGS: There are no findings suspicious for malignancy. Images were
processed with CAD.
IMPRESSION: No mammographic evidence of malignancy. A result letter of this
screening mammogram will be mailed directly to the patient.

RECOMMENDATION:
Screening mammogram in one year. (Code:[id number]-[id number])

BI-RADS CATEGORY  1: Negative

## 2015-05-11 ENCOUNTER — Ambulatory Visit: Payer: 59 | Admitting: Family Medicine

## 2015-05-18 ENCOUNTER — Other Ambulatory Visit: Payer: Self-pay | Admitting: *Deleted

## 2015-05-18 MED ORDER — DICLOFENAC SODIUM 75 MG PO TBEC: 75.0000 mg | DELAYED_RELEASE_TABLET | Freq: Two times a day (BID) | ORAL | Status: DC

## 2015-05-18 MED ORDER — VENLAFAXINE HCL ER 150 MG PO CP24: ORAL_CAPSULE | ORAL | Status: DC

## 2015-05-18 NOTE — Progress Notes (Signed)
Thirty d ok  For ea, sched six mo ck up if not already

## 2015-05-27 ENCOUNTER — Other Ambulatory Visit: Payer: Self-pay | Admitting: Family Medicine

## 2015-05-28 NOTE — Telephone Encounter (Signed)
One month ok neees oov

## 2015-06-09 ENCOUNTER — Telehealth: Payer: Self-pay | Admitting: Cardiovascular Disease

## 2015-06-09 NOTE — Telephone Encounter (Signed)
Spoke with pt and appt made for her to see Dr. Angelena Form on June 14, 2015 at 10:30

## 2015-06-09 NOTE — Telephone Encounter (Signed)
New message   Pt calling she states that she has spoken to Dr. Angelena Form and he has agreed   To see her as a new pt  She said you can call her back at either numbers listed  Can you run this by him and see where he wants to schedule a New Patient?

## 2015-06-11 ENCOUNTER — Encounter: Payer: Self-pay | Admitting: *Deleted

## 2015-06-14 ENCOUNTER — Encounter: Payer: Self-pay | Admitting: Cardiovascular Disease

## 2015-06-14 ENCOUNTER — Ambulatory Visit (INDEPENDENT_AMBULATORY_CARE_PROVIDER_SITE_OTHER): Payer: 59 | Admitting: Cardiovascular Disease

## 2015-06-14 VITALS — BP 128/84 | HR 69 | Ht 63.5 in | Wt 210.0 lb

## 2015-06-14 DIAGNOSIS — I10 Essential (primary) hypertension: Secondary | ICD-10-CM | POA: Diagnosis not present

## 2015-06-14 MED ORDER — METOPROLOL SUCCINATE ER 100 MG PO TB24: 100.0000 mg | ORAL_TABLET | Freq: Every day | ORAL | Status: DC

## 2015-06-14 NOTE — Patient Instructions (Addendum)
Medication Instructions:    Your physician has recommended you make the following change in your medication: Increase Toprol to 100 mg by mouth daily  Labwork: none  Testing/Procedures: Your physician has requested that you have a renal artery duplex. During this test, an ultrasound is used to evaluate blood flow to the kidneys. Allow one hour for this exam. Do not eat after midnight the day before and avoid carbonated beverages. Take your medications as you usually do.  Your physician has requested that you have an echocardiogram. Echocardiography is a painless test that uses sound waves to create images of your heart. It provides your doctor with information about the size and shape of your heart and how well your heart's chambers and valves are working. This procedure takes approximately one hour. There are no restrictions for this procedure.    Follow-Up: Your physician recommends that you schedule a follow-up appointment in: 3-4 weeks. --Scheduled for August 02, 2015 at 9:30   Any Other Special Instructions Will Be Listed Below (If Applicable).

## 2015-06-14 NOTE — Progress Notes (Signed)
Chief Complaint  Patient presents with  . Hypertension     History of Present Illness: 64 yo female with history of DM, HTN here today as a new patient for evaluation of HTN. 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 She has been doing well. She has no chest pain, SOB, palpitations, near syncope or syncope. She is here today to discuss her HTN. She has been followed by Dr. Wolfgang Phoenix and is very happy with his care. She is on HCTZ, Toprol, Cozaar. She wants to make sure there are no cardiac issues contributing to her HTN.   Primary Care Physician: Mickie Hillier, MD   Past Medical History  Diagnosis Date  . Diabetes mellitus   . High blood pressure   . Hypothyroid   . Arthritis   . Obesity   . Depression   . PONV (postoperative nausea and vomiting)     Past Surgical History  Procedure Laterality Date  . Carpal tunnel release Right   . Thyroid surgery      removal  . Abdominal hysterectomy    . Bunions Right   . Knee arthroscopy Right   . Total knee arthroplasty Right 08/25/2013    Procedure: TOTAL KNEE ARTHROPLASTY;  Surgeon: Carole Civil, MD;  Location: [facility];  Service: Orthopedics;  Laterality: Right;  . Total knee arthroplasty Left 09/09/2014    Procedure: LEFT TOTAL KNEE ARTHROPLASTY;  Surgeon: Carole Civil, MD;  Location: [facility];  Service: Orthopedics;  Laterality: Left;    Current Outpatient Prescriptions  Medication Sig Dispense Refill  . aspirin EC 325 MG EC tablet Take 1 tablet (325 mg total) by mouth 2 (two) times daily. 60 tablet 0  . diclofenac (VOLTAREN) 75 MG EC tablet Take 1 tablet by mouth two  times daily 60 tablet 0  . hydrochlorothiazide (HYDRODIURIL) 25 MG tablet Take 1 tablet (25 mg total) by mouth daily. 90 tablet 1  . levothyroxine (SYNTHROID, LEVOTHROID) 137 MCG tablet Take 1 tablet (137 mcg total) by mouth daily before breakfast. 90 tablet 1  . losartan (COZAAR) 100 MG tablet TAKE 1 TABLET (100 MG TOTAL) qam 90 tablet 1  . metFORMIN  (GLUCOPHAGE) 500 MG tablet Take 1 tablet (500 mg total) by mouth 2 (two) times daily with a meal. 180 tablet 1  . metoprolol succinate (TOPROL-XL) 100 MG 24 hr tablet Take 1 tablet (100 mg total) by mouth at bedtime. 90 tablet 3  . OVER THE COUNTER MEDICATION Take 1 tablet by mouth daily. OTC estroven    . pantoprazole (PROTONIX) 40 MG tablet Take 1 tablet (40 mg total) by mouth daily. 90 tablet 1  . venlafaxine XR (EFFEXOR-XR) 150 MG 24 hr capsule Take 1 capsule by mouth  daily 30 capsule 0   No current facility-administered medications for this visit.   Facility-Administered Medications Ordered in Other Visits  Medication Dose Route Frequency Provider Last Rate Last Dose  . bupivacaine liposome (EXPAREL) 1.3 % injection 266 mg  20 mL Infiltration Once Carole Civil, MD        Allergies  Allergen Reactions  . Ace Inhibitors Cough    Social History   Social History  . Marital Status: Married    Spouse Name: N/A  . Number of Children: N/A  . 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 Smoking status: Never Smoker   . Smokeless tobacco: Not on file  .  Alcohol Use: No  . Drug Use: No  . Sexual Activity: Yes    Birth Control/ Protection: Surgical   Other Topics Concern  . Not on file   Social History Narrative    Family History  Problem Relation Age of Onset  . Cancer    . Diabetes    . Congestive Heart Failure Father   . Hypertension Father   . Diabetes Mother   . Hypertension Mother   . Hypertension Brother     Review of Systems:  As stated in the HPI and otherwise negative.   BP 128/84 mmHg  Pulse 69  Ht 5' 3.5" (1.613 m)  Wt 210 lb (95.255 kg)  BMI 36.61 kg/m2  SpO2 96%  Physical Examination: General: Well developed, well nourished, NAD HEENT: OP clear, mucus membranes moist SKIN: warm, dry. No rashes. Neuro: No focal deficits Musculoskeletal: Muscle strength 5/5 all ext Psychiatric: Mood  and affect normal Neck: No JVD, no carotid bruits, no thyromegaly, no lymphadenopathy. Lungs:Clear bilaterally, no wheezes, rhonci, crackles Cardiovascular: Regular rate and rhythm. No murmurs, gallops or rubs. Abdomen:Soft. Bowel sounds present. Non-tender.  Extremities: No lower extremity edema. Pulses are 2 + in the bilateral DP/PT.  EKG:  EKG is ordered today. The ekg ordered today demonstrates NSR, rate 69 bpm. T wave inversion precordial leads. Unchanged.   Recent Labs: 08/11/2014: Magnesium 1.6 09/10/2014: BUN 22; Creatinine, Ser 0.94; Potassium 4.3; Sodium 141 09/11/2014: Hemoglobin 9.7*; Platelets 242 11/11/2014: TSH 3.790     Wt Readings from Last 3 Encounters:  06/14/15 210 lb (95.255 kg)  11/30/14 211 lb 8 oz (95.936 kg)  11/11/14 211 lb 8 oz (95.936 kg)     Other studies Reviewed: Additional studies/ records that were reviewed today include: . Review of the above records demonstrates:    Assessment and Plan:   1. HTN: Her BP is controlled today but she reports systolic BP as high as 599 at home. Will increase Toprol to 100 mg daily. Will continue Cozaar and HCTZ. Will arrange echocardiogram to assess LV systolic function, LV size. Will arrange renal artery dopplers to exclude renal artery stenosis. She will continue to follow BP at home. I will see her back in 3-4 weeks to review testing. Long term management of HTN will be in primary care.   Current medicines are reviewed at length with the patient today.  The patient does not have concerns regarding medicines.  The following changes have been made:  no change  Labs/ tests ordered today include:   Orders Placed This Encounter  Procedures  . EKG 12-Lead  . Echocardiogram    Disposition:   FU with me in 4 weeks  Signed, Lauree Chandler, MD 06/14/2015 12:21 PM    [facility] [place], [place], [place]  [address] Phone: [phone]; Fax: [phone]

## 2015-06-28 ENCOUNTER — Ambulatory Visit (HOSPITAL_BASED_OUTPATIENT_CLINIC_OR_DEPARTMENT_OTHER): Payer: 59

## 2015-06-28 ENCOUNTER — Ambulatory Visit (HOSPITAL_COMMUNITY)
Admission: RE | Admit: 2015-06-28 | Discharge: 2015-06-28 | Disposition: A | Discharge status: Home / Self Care | Payer: 59 | Source: Ambulatory Visit | Attending: Cardiology | Admitting: Cardiology

## 2015-06-28 ENCOUNTER — Other Ambulatory Visit: Payer: Self-pay

## 2015-06-28 DIAGNOSIS — I10 Essential (primary) hypertension: Secondary | ICD-10-CM

## 2015-06-28 DIAGNOSIS — I517 Cardiomegaly: Secondary | ICD-10-CM | POA: Insufficient documentation

## 2015-06-28 DIAGNOSIS — Z6837 Body mass index (BMI) 37.0-37.9, adult: Secondary | ICD-10-CM | POA: Diagnosis not present

## 2015-06-28 DIAGNOSIS — I5189 Other ill-defined heart diseases: Secondary | ICD-10-CM | POA: Insufficient documentation

## 2015-06-28 DIAGNOSIS — E669 Obesity, unspecified: Secondary | ICD-10-CM | POA: Insufficient documentation

## 2015-06-28 DIAGNOSIS — E119 Type 2 diabetes mellitus without complications: Secondary | ICD-10-CM | POA: Insufficient documentation

## 2015-07-05 ENCOUNTER — Other Ambulatory Visit: Payer: Self-pay | Admitting: Family Medicine

## 2015-07-06 ENCOUNTER — Encounter: Payer: Self-pay | Admitting: Family Medicine

## 2015-07-06 ENCOUNTER — Ambulatory Visit (INDEPENDENT_AMBULATORY_CARE_PROVIDER_SITE_OTHER): Payer: 59 | Admitting: Family Medicine

## 2015-07-06 VITALS — BP 142/80 | Ht 63.0 in | Wt 211.4 lb

## 2015-07-06 DIAGNOSIS — E119 Type 2 diabetes mellitus without complications: Secondary | ICD-10-CM

## 2015-07-06 DIAGNOSIS — E039 Hypothyroidism, unspecified: Secondary | ICD-10-CM | POA: Diagnosis not present

## 2015-07-06 DIAGNOSIS — I10 Essential (primary) hypertension: Secondary | ICD-10-CM

## 2015-07-06 LAB — POCT GLYCOSYLATED HEMOGLOBIN (HGB A1C): Hemoglobin A1C: 6.4

## 2015-07-06 MED ORDER — VENLAFAXINE HCL ER 75 MG PO CP24: 75.0000 mg | ORAL_CAPSULE | Freq: Every day | ORAL | Status: DC

## 2015-07-06 MED ORDER — METFORMIN HCL 500 MG PO TABS: ORAL_TABLET | ORAL | Status: DC

## 2015-07-06 MED ORDER — DICLOFENAC SODIUM 75 MG PO TBEC: DELAYED_RELEASE_TABLET | ORAL | Status: DC

## 2015-07-06 MED ORDER — LEVOTHYROXINE SODIUM 137 MCG PO TABS: 137.0000 ug | ORAL_TABLET | Freq: Every day | ORAL | Status: DC

## 2015-07-06 NOTE — Progress Notes (Signed)
   Subjective:    Patient ID: Kimberly [name], female    DOB: [birth year], 64 y.o.   MRN: [id number] Patient arrives office with several concerns.   Diabetes She presents for her follow-up diabetic visit. She has type 2 diabetes mellitus. No MedicAlert identification noted. She has not had a previous visit with a dietitian. She does not see a podiatrist.Eye exam is current (August 2016).   Patient states no concerns this visit.   Results for orders placed or performed in visit on 07/06/15  POCT HgB A1C  Result Value Ref Range   Hemoglobin A1C 6.4    Glu ovdrall good numb good watchng diet  Eye Dr. visits in recent months within normal limits   Not good with ercise  Notes compliance with depression medicine. Would like to come off it. States mood overall stable. No obvious side effects. Next  Compliant blood pressure medication. Metoprolol component recently increased. Medicines reviewed. Watching salt intake. Not exercising as much as she would like  Hemoglobin A1c: 6.4   Review of Systems No headache no chest pain no back pain no abdominal pain no change in bowel habits no blood in stool ROS otherwise negative    Objective:   Physical Exam  Alert vital stable blood pressure good on repeat 1 3478 HEENT normal lungs clear heart rare rhythm ankles without edema mood intact no suicidal thoughts happy affect oriented      Assessment & Plan:  Impression 1 type 2 diabetes good control medications discussed maintain same #2 hypertension good control medications discussed maintain same #3 depression good control patient like to wean off this is discussed #4 hypothyroidism maintaining meds no symptoms of high or low status uncertain plan check appropriate blood work. Other medications refilled diet exercise discussed cut down to 75 Effexor XR daily recheck in 6 months WSL

## 2015-07-09 LAB — BASIC METABOLIC PANEL
BUN / CREAT RATIO: 19 (ref 11–26)
BUN: 19 mg/dL (ref 8–27)
CALCIUM: 9.3 mg/dL (ref 8.7–10.3)
CO2: 23 mmol/L (ref 18–29)
CREATININE: 0.98 mg/dL (ref 0.57–1.00)
Chloride: 99 mmol/L (ref 97–106)
GFR calc Af Amer: 71 mL/min/{1.73_m2} (ref >59)
GFR calc non Af Amer: 62 mL/min/{1.73_m2} (ref >59)
Glucose: 150 mg/dL — ABNORMAL HIGH (ref 65–99)
Potassium: 4.5 mmol/L (ref 3.5–5.2)
Sodium: 140 mmol/L (ref 136–144)

## 2015-07-09 LAB — LIPID PANEL
CHOL/HDL RATIO: 4.3 ratio (ref 0.0–4.4)
Cholesterol, Total: 173 mg/dL (ref 100–199)
HDL: 40 mg/dL (ref >39)
LDL Calculated: 91 mg/dL (ref 0–99)
Triglycerides: 210 mg/dL — ABNORMAL HIGH (ref 0–149)
VLDL Cholesterol Cal: 42 mg/dL — ABNORMAL HIGH (ref 5–40)

## 2015-07-09 LAB — MICROALBUMIN / CREATININE URINE RATIO
Creatinine, Urine: 332.3 mg/dL
MICROALB/CREAT RATIO: 18.9 mg/g{creat} (ref 0.0–30.0)
MICROALBUM., U, RANDOM: 62.8 ug/mL

## 2015-07-09 LAB — HEPATIC FUNCTION PANEL
ALT: 96 IU/L — ABNORMAL HIGH (ref 0–32)
AST: 63 IU/L — ABNORMAL HIGH (ref 0–40)
Albumin: 4.6 g/dL (ref 3.6–4.8)
Alkaline Phosphatase: 98 IU/L (ref 39–117)
BILIRUBIN TOTAL: 0.4 mg/dL (ref 0.0–1.2)
Bilirubin, Direct: 0.11 mg/dL (ref 0.00–0.40)
TOTAL PROTEIN: 7.4 g/dL (ref 6.0–8.5)

## 2015-07-09 LAB — TSH: TSH: 1.03 u[IU]/mL (ref 0.450–4.500)

## 2015-07-20 ENCOUNTER — Ambulatory Visit: Payer: 59 | Admitting: Family Medicine

## 2015-07-21 ENCOUNTER — Encounter: Payer: Self-pay | Admitting: Family Medicine

## 2015-07-21 ENCOUNTER — Ambulatory Visit (INDEPENDENT_AMBULATORY_CARE_PROVIDER_SITE_OTHER): Payer: 59 | Admitting: Family Medicine

## 2015-07-21 VITALS — BP 138/80 | Temp 98.2°F | Ht 63.0 in | Wt 206.0 lb

## 2015-07-21 DIAGNOSIS — R748 Abnormal levels of other serum enzymes: Secondary | ICD-10-CM

## 2015-07-21 DIAGNOSIS — J310 Chronic rhinitis: Secondary | ICD-10-CM

## 2015-07-21 DIAGNOSIS — J329 Chronic sinusitis, unspecified: Secondary | ICD-10-CM | POA: Diagnosis not present

## 2015-07-21 MED ORDER — CEFPROZIL 500 MG PO TABS: 500.0000 mg | ORAL_TABLET | Freq: Two times a day (BID) | ORAL | Status: DC

## 2015-07-21 NOTE — Progress Notes (Signed)
   Subjective:    Patient ID: Kimberly [name], female    DOB: [birth year], 64 y.o.   MRN: [id number]  HPIDiscuss lab results. Elevated liver enzymes.   Sinus drainage, cough, headache, sore throat,ear pain. Low grade fever. Started 2 weeks ago. Tried sudafed. Pt would like any prescription today printed out to take to pharm  Pos cong and cough  Sinus pressure  Dim energy    .   Results for orders placed or performed in visit on 07/06/15  Lipid panel  Result Value Ref Range   Cholesterol, Total 173 100 - 199 mg/dL   Triglycerides 210 (H) 0 - 149 mg/dL   HDL 40 >39 mg/dL   VLDL Cholesterol Cal 42 (H) 5 - 40 mg/dL   LDL Calculated 91 0 - 99 mg/dL   Chol/HDL Ratio 4.3 0.0 - 4.4 ratio units  Hepatic function panel  Result Value Ref Range   Total Protein 7.4 6.0 - 8.5 g/dL   Albumin 4.6 3.6 - 4.8 g/dL   Bilirubin Total 0.4 0.0 - 1.2 mg/dL   Bilirubin, Direct 0.11 0.00 - 0.40 mg/dL   Alkaline Phosphatase 98 39 - 117 IU/L   AST 63 (H) 0 - 40 IU/L   ALT 96 (H) 0 - 32 IU/L  Basic metabolic panel  Result Value Ref Range   Glucose 150 (H) 65 - 99 mg/dL   BUN 19 8 - 27 mg/dL   Creatinine, Ser 0.98 0.57 - 1.00 mg/dL   GFR calc non Af Amer 62 >59 mL/min/1.73   GFR calc Af Amer 71 >59 mL/min/1.73   BUN/Creatinine Ratio 19 11 - 26   Sodium 140 136 - 144 mmol/L   Potassium 4.5 3.5 - 5.2 mmol/L   Chloride 99 97 - 106 mmol/L   CO2 23 18 - 29 mmol/L   Calcium 9.3 8.7 - 10.3 mg/dL  TSH  Result Value Ref Range   TSH 1.030 0.450 - 4.500 uIU/mL  Microalbumin / creatinine urine ratio  Result Value Ref Range   Creatinine, Urine 332.3 Not Estab. mg/dL   Microalbum.,U,Random 62.8 Not Estab. ug/mL   MICROALB/CREAT RATIO 18.9 0.0 - 30.0 mg/g creat  POCT HgB A1C  Result Value Ref Range   Hemoglobin A1C 6.4    No significant alcohol intake  Review of Systems No fever no chills no dysuria no abdominal pain ROS otherwise negative    Objective:   Physical Exam  Alert vitals stable  moderate malaise HET moderate his congestion frontal neck supple lungs clear heart regular in rhythm      Assessment & Plan:  Impression 1 elevated liver enzymes discussed at great length many questions answered likely fatty liver further testing warranted #2 subacute rhinosinusitis/bronchitis plan 25 minutes spent most in discussion appropriate blood work. Antibiotics prescribed. Liver ultrasound scheduled. WSL further recommendations based results

## 2015-07-27 ENCOUNTER — Other Ambulatory Visit: Payer: Self-pay

## 2015-07-27 DIAGNOSIS — R7989 Other specified abnormal findings of blood chemistry: Secondary | ICD-10-CM

## 2015-07-27 DIAGNOSIS — R748 Abnormal levels of other serum enzymes: Secondary | ICD-10-CM

## 2015-07-27 LAB — IRON AND TIBC
IRON: 91 ug/dL (ref 27–139)
Iron Saturation: 29 % (ref 15–55)
Total Iron Binding Capacity: 314 ug/dL (ref 250–450)
UIBC: 223 ug/dL (ref 118–369)

## 2015-07-27 LAB — HEPATITIS C ANTIBODY

## 2015-07-27 LAB — FERRITIN: FERRITIN: 293 ng/mL — AB (ref 15–150)

## 2015-07-27 LAB — HEPATITIS B SURFACE ANTIGEN: HEP B S AG: NEGATIVE

## 2015-07-29 ENCOUNTER — Encounter: Payer: Self-pay | Admitting: Family Medicine

## 2015-07-30 ENCOUNTER — Ambulatory Visit (HOSPITAL_COMMUNITY)
Admission: RE | Admit: 2015-07-30 | Discharge: 2015-07-30 | Disposition: A | Discharge status: Home / Self Care | Payer: 59 | Source: Ambulatory Visit | Attending: Family Medicine | Admitting: Family Medicine

## 2015-07-30 DIAGNOSIS — R748 Abnormal levels of other serum enzymes: Secondary | ICD-10-CM | POA: Diagnosis present

## 2015-07-30 DIAGNOSIS — K8020 Calculus of gallbladder without cholecystitis without obstruction: Secondary | ICD-10-CM | POA: Insufficient documentation

## 2015-07-30 DIAGNOSIS — K760 Fatty (change of) liver, not elsewhere classified: Secondary | ICD-10-CM | POA: Diagnosis not present

## 2015-08-02 ENCOUNTER — Ambulatory Visit (INDEPENDENT_AMBULATORY_CARE_PROVIDER_SITE_OTHER): Payer: 59 | Admitting: Cardiovascular Disease

## 2015-08-02 ENCOUNTER — Encounter: Payer: Self-pay | Admitting: Cardiovascular Disease

## 2015-08-02 VITALS — BP 106/52 | HR 72 | Resp 98 | Ht 63.0 in | Wt 208.1 lb

## 2015-08-02 DIAGNOSIS — I10 Essential (primary) hypertension: Secondary | ICD-10-CM | POA: Diagnosis not present

## 2015-08-02 NOTE — Progress Notes (Signed)
Chief Complaint  Patient presents with  . Hypertension    follow up on new meds     History of Present Illness: 64 yo female with history of DM, HTN here today for cardiac follow up. I saw her as a new patient for evaluation of HTN 06/14/15. 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 She had no complaints. She has been followed by Dr. Wolfgang Phoenix and is very happy with his care. She is on HCTZ, Toprol, Cozaar. She wants to make sure there are no cardiac issues contributing to her HTN. I increased her Toprol at our first visit. Echo October 2016 with normal LV systolic function, mild LVH, no significant valve disease. Renal artery dopplers October 2016 with normal kidney size, no evidence of renal artery stenosis.   She is here today for follow up. She has no chest pain or SOB.   Primary Care Physician: Mickie Hillier, MD   Past Medical History  Diagnosis Date  . Diabetes mellitus   . High blood pressure   . Hypothyroid   . Arthritis   . Obesity   . Depression   . PONV (postoperative nausea and vomiting)     Past Surgical History  Procedure Laterality Date  . Carpal tunnel release Right   . Thyroid surgery      removal  . Abdominal hysterectomy    . Bunions Right   . Knee arthroscopy Right   . Total knee arthroplasty Right 08/25/2013    Procedure: TOTAL KNEE ARTHROPLASTY;  Surgeon: Carole Civil, MD;  Location: [facility];  Service: Orthopedics;  Laterality: Right;  . Total knee arthroplasty Left 09/09/2014    Procedure: LEFT TOTAL KNEE ARTHROPLASTY;  Surgeon: Carole Civil, MD;  Location: [facility];  Service: Orthopedics;  Laterality: Left;    Current Outpatient Prescriptions  Medication Sig Dispense Refill  . aspirin 81 MG tablet Take 81 mg by mouth daily.    . diclofenac (VOLTAREN) 75 MG EC tablet Take 1 tablet by mouth two  times daily 60 tablet 5  . hydrochlorothiazide (HYDRODIURIL) 25 MG tablet TAKE 1 TABLET by mouth  daily 30 tablet 0  . levothyroxine  (SYNTHROID, LEVOTHROID) 137 MCG tablet Take 1 tablet (137 mcg total) by mouth daily before breakfast. 90 tablet 1  . losartan (COZAAR) 100 MG tablet TAKE 1 TABLET BY MOUTH  EVERY MORNING 30 tablet 0  . metFORMIN (GLUCOPHAGE) 500 MG tablet Take 1 tablet BY MOUTH  TWICE DAILY WITH A MEAL 60 tablet 5  . metoprolol succinate (TOPROL-XL) 100 MG 24 hr tablet Take 1 tablet (100 mg total) by mouth at bedtime. 90 tablet 3  . pantoprazole (PROTONIX) 40 MG tablet Take 1 tablet (40 mg total) by mouth daily. 90 tablet 1  . venlafaxine XR (EFFEXOR XR) 75 MG 24 hr capsule Take 1 capsule (75 mg total) by mouth daily with breakfast. 30 capsule 5   No current facility-administered medications for this visit.   Facility-Administered Medications Ordered in Other Visits  Medication Dose Route Frequency Provider Last Rate Last Dose  . bupivacaine liposome (EXPAREL) 1.3 % injection 266 mg  20 mL Infiltration Once Carole Civil, MD        Allergies  Allergen Reactions  . Ace Inhibitors Cough    Social History   Social History  . Marital Status: Married    Spouse Name: N/A  . Number of Children: N/A  . 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 Smoking status: Never Smoker   . Smokeless tobacco: Not on file  . Alcohol Use: No  . Drug Use: No  . Sexual Activity: Yes    Birth Control/ Protection: Surgical   Other Topics Concern  . Not on file   Social History Narrative    Family History  Problem Relation Age of Onset  . Cancer    . Diabetes    . Congestive Heart Failure Father   . Hypertension Father   . Diabetes Mother   . Hypertension Mother   . Hypertension Brother     Review of Systems:  As stated in the HPI and otherwise negative.   BP 106/52 mmHg  Pulse 72  Resp 98  Ht 5\' 3"  (1.6 m)  Wt 208 lb 1.9 oz (94.403 kg)  BMI 36.88 kg/m2  Physical Examination: General: Well developed, well nourished, NAD HEENT: OP clear,  mucus membranes moist SKIN: warm, dry. No rashes. Neuro: No focal deficits Musculoskeletal: Muscle strength 5/5 all ext Psychiatric: Mood and affect normal Neck: No JVD, no carotid bruits, no thyromegaly, no lymphadenopathy. Lungs:Clear bilaterally, no wheezes, rhonci, crackles Cardiovascular: Regular rate and rhythm. No murmurs, gallops or rubs. Abdomen:Soft. Bowel sounds present. Non-tender.  Extremities: No lower extremity edema. Pulses are 2 + in the bilateral DP/PT.  Echo 06/28/15: Left ventricle: The cavity size was normal. Wall thickness was increased in a pattern of mild LVH. Systolic function was normal. The estimated ejection fraction was in the range of 55% to 60%. Wall motion was normal; there were no regional wall motion abnormalities. Doppler parameters are consistent with abnormal left ventricular relaxation (grade 1 diastolic dysfunction). The E/e&' ratio is >15, suggesting elevated LV filling pressure. - Left atrium: The atrium was normal in size. - Inferior vena cava: The vessel was normal in size. The respirophasic diameter changes were in the normal range (>= 50%), consistent with normal central venous pressure.  Impressions:  - LVEF 55-60%, mild LVH, normal wall motion, diastolic dysfunction, elevated LV filling pressure, normal LA size.   EKG:  EKG is not ordered today. The ekg ordered today demonstrates  Recent Labs: 08/11/2014: Magnesium 1.6 09/11/2014: Hemoglobin 9.7*; Platelets 242 07/08/2015: ALT 96*; BUN 19; Creatinine, Ser 0.98; Potassium 4.5; Sodium 140; TSH 1.030     Wt Readings from Last 3 Encounters:  08/02/15 208 lb 1.9 oz (94.403 kg)  07/21/15 206 lb (93.441 kg)  07/06/15 211 lb 6 oz (95.879 kg)     Other studies Reviewed: Additional studies/ records that were reviewed today include: . Review of the above records demonstrates:    Assessment and Plan:   1. HTN: Her BP is controlled. Will continue Cozaar and HCTZ.  Echo with normal LV function. Renal artery dopplers normal. No further cardiac workup at this time.   Current medicines are reviewed at length with the patient today.  The patient does not have concerns regarding medicines.  The following changes have been made:  no change  Labs/ tests ordered today include:   No orders of the defined types were placed in this encounter.    Disposition:   FU with me as needed.   Signed, Lauree Chandler, MD 08/02/2015 10:03 AM    [facility] [place], [place], [place]  [address] Phone: [phone]; Fax: [phone]

## 2015-08-02 NOTE — Patient Instructions (Signed)
Medication Instructions:  Your physician recommends that you continue on your current medications as directed. Please refer to the Current Medication list given to you today.   Labwork: none  Testing/Procedures: none  Follow-Up: Your physician recommends that you schedule a follow-up appointment as needed.    Any Other Special Instructions Will Be Listed Below (If Applicable).     If you need a refill on your cardiac medications before your next appointment, please call your pharmacy.   

## 2015-08-20 ENCOUNTER — Encounter: Payer: Self-pay | Admitting: Cardiovascular Disease

## 2015-09-06 MED FILL — VENLAFAXINE HCL ER 75 MG CA: 75 | 30 days supply | Qty: 30 | Fill #2

## 2015-09-07 ENCOUNTER — Ambulatory Visit: Payer: 59

## 2015-09-07 ENCOUNTER — Ambulatory Visit (INDEPENDENT_AMBULATORY_CARE_PROVIDER_SITE_OTHER): Payer: 59 | Admitting: Orthopedic Surgery

## 2015-09-07 ENCOUNTER — Ambulatory Visit (INDEPENDENT_AMBULATORY_CARE_PROVIDER_SITE_OTHER): Payer: 59

## 2015-09-07 VITALS — BP 127/63 | Ht 63.0 in | Wt 205.0 lb

## 2015-09-07 DIAGNOSIS — M5442 Lumbago with sciatica, left side: Secondary | ICD-10-CM

## 2015-09-07 DIAGNOSIS — Z96652 Presence of left artificial knee joint: Secondary | ICD-10-CM

## 2015-09-07 DIAGNOSIS — Z96651 Presence of right artificial knee joint: Secondary | ICD-10-CM | POA: Diagnosis not present

## 2015-09-07 DIAGNOSIS — M5441 Lumbago with sciatica, right side: Secondary | ICD-10-CM | POA: Diagnosis not present

## 2015-09-07 NOTE — Progress Notes (Signed)
Patient ID: Kimberly [name], female   DOB: April 29, 1951, 65 y.o.   MRN: [id number]  Follow up visit  Chief Complaint  Patient presents with  . Follow-up    Yearly recheck on left knee rteplacement, DOS 09-09-14.   [name] is doing well with both knees she is actually here for bilateral knee replacement follow-up  The left knee was in January 2016 the right knee was in December 2012  Right knee functioning very well left knee she's having some pain in her left leg anterior compartment and lateral lower leg and complains of bilateral hip pain when lying flat in the bed causing her to have to get up.  Review of systems denies bowel or bladder dysfunction denies history of previous back problems does notice increased difficulty getting her leg into the car.  Denies groin or anterior thigh pain  Past Medical History  Diagnosis Date  . Diabetes mellitus   . High blood pressure   . Hypothyroid   . Arthritis   . Obesity   . Depression   . PONV (postoperative nausea and vomiting)     BP 127/63 mmHg  Ht 5\' 3"  (1.6 m)  Wt 205 lb (92.987 kg)  BMI 36.32 kg/m2 She is awake alert and oriented 3 her appearance is normal she is well-groomed. Mood is pleasant  Gait is without abnormality  Right knee inspection shows a well-healed incision excellent function in flexion and extension is full. Stability of the knee is normal motor function intact skin incision as stated no neurovascular deficits  Left knee well-healed anterior incision flexion ARC 0-1 20. Knee is stable in extension and flexion motor exam normal sensation and vascularity normal  She does have tenderness anterior compartment left leg nontender lower back nontender anterior thigh tender lower lateral thigh   Encounter Diagnoses  Name Primary?  . History of right knee joint replacement Yes  . History of left knee replacement   . Bilateral low back pain with sciatica, sciatica laterality unspecified    Early-onset lower  back pain  Recommend physical therapy one visit home exercise program  Follow-up bilateral knees one year for x-rays

## 2015-09-07 NOTE — Patient Instructions (Signed)
Call [facility] therapy dept to schedule therapy visits 

## 2015-09-08 MED FILL — PANTOPRAZOLE SOD DR 40 MG T: 40 | 90 days supply | Qty: 90 | Fill #1

## 2015-09-15 ENCOUNTER — Other Ambulatory Visit: Payer: Self-pay | Admitting: *Deleted

## 2015-09-15 MED ORDER — LOSARTAN POTASSIUM 100 MG PO TABS: ORAL_TABLET | ORAL | Status: DC

## 2015-09-15 MED FILL — LOSARTAN POTASSIUM 100 MG T: 100 | 30 days supply | Qty: 30 | Fill #0

## 2015-09-15 MED FILL — DICLOFENAC SOD EC 75 MG TAB: 75 | 30 days supply | Qty: 60 | Fill #1

## 2015-09-23 ENCOUNTER — Telehealth (HOSPITAL_COMMUNITY): Payer: Self-pay

## 2015-09-23 NOTE — Telephone Encounter (Signed)
She has to work and will call us back later.

## 2015-09-24 ENCOUNTER — Ambulatory Visit (HOSPITAL_COMMUNITY): Payer: 59

## 2015-10-06 MED FILL — VENLAFAXINE HCL ER 75 MG CA: 75 | 30 days supply | Qty: 30 | Fill #3

## 2015-10-06 MED FILL — LEVOTHYROXINE 137 MCG TAB: 137 | 90 days supply | Qty: 90 | Fill #0

## 2015-11-05 ENCOUNTER — Ambulatory Visit: Payer: 59 | Admitting: Orthopedic Surgery

## 2015-11-09 ENCOUNTER — Other Ambulatory Visit: Payer: Self-pay | Admitting: Family Medicine

## 2015-11-09 MED FILL — LOSARTAN POTASSIUM 100 MG T: 100 | 30 days supply | Qty: 30 | Fill #0

## 2015-11-09 MED FILL — metFORMIN HCL 500 MG TABS: 500 | 30 days supply | Qty: 60 | Fill #0

## 2015-11-09 MED FILL — VENLAFAXINE HCL ER 75 MG CA: 75 | 30 days supply | Qty: 30 | Fill #4

## 2015-11-09 MED FILL — METOPROLOL SUCC ER 100 MG T: 100 | 90 days supply | Qty: 90 | Fill #1

## 2015-11-09 MED FILL — HYDROCHLOROTHIAZIDE 25 MG T: 25 | 90 days supply | Qty: 90 | Fill #0

## 2015-11-10 MED FILL — DICLOFENAC SOD EC 75 MG TAB: 75 | 30 days supply | Qty: 60 | Fill #2

## 2015-12-06 MED FILL — VENLAFAXINE HCL ER 75 MG CA: 75 | 30 days supply | Qty: 30 | Fill #5

## 2015-12-10 ENCOUNTER — Encounter (HOSPITAL_COMMUNITY): Payer: Self-pay

## 2015-12-20 ENCOUNTER — Other Ambulatory Visit: Payer: Self-pay | Admitting: Family Medicine

## 2015-12-20 MED FILL — LOSARTAN POTASSIUM 100 MG T: 100 | 30 days supply | Qty: 30 | Fill #0

## 2015-12-20 MED FILL — metFORMIN HCL 500 MG TABS: 500 | 30 days supply | Qty: 60 | Fill #1

## 2015-12-20 MED FILL — PANTOPRAZOLE SOD DR 40 MG T: 40 | 90 days supply | Qty: 90 | Fill #2

## 2015-12-28 ENCOUNTER — Ambulatory Visit (INDEPENDENT_AMBULATORY_CARE_PROVIDER_SITE_OTHER): Payer: 59 | Admitting: Family Medicine

## 2015-12-28 VITALS — Ht 63.0 in

## 2015-12-28 DIAGNOSIS — I10 Essential (primary) hypertension: Secondary | ICD-10-CM

## 2015-12-28 DIAGNOSIS — R748 Abnormal levels of other serum enzymes: Secondary | ICD-10-CM

## 2015-12-28 DIAGNOSIS — E119 Type 2 diabetes mellitus without complications: Secondary | ICD-10-CM

## 2015-12-28 DIAGNOSIS — Z79899 Other long term (current) drug therapy: Secondary | ICD-10-CM | POA: Diagnosis not present

## 2015-12-28 DIAGNOSIS — F329 Major depressive disorder, single episode, unspecified: Secondary | ICD-10-CM | POA: Diagnosis not present

## 2015-12-28 DIAGNOSIS — F32A Depression, unspecified: Secondary | ICD-10-CM

## 2015-12-28 MED ORDER — PANTOPRAZOLE SODIUM 40 MG PO TBEC: 40.0000 mg | DELAYED_RELEASE_TABLET | Freq: Every day | ORAL | Status: DC

## 2015-12-28 MED ORDER — LEVOTHYROXINE SODIUM 137 MCG PO TABS: 137.0000 ug | ORAL_TABLET | Freq: Every day | ORAL | Status: DC

## 2015-12-28 MED ORDER — METFORMIN HCL 500 MG PO TABS: ORAL_TABLET | ORAL | Status: DC

## 2015-12-28 MED ORDER — VENLAFAXINE HCL ER 75 MG PO CP24: 75.0000 mg | ORAL_CAPSULE | Freq: Every day | ORAL | Status: DC

## 2015-12-28 MED ORDER — HYDROCHLOROTHIAZIDE 25 MG PO TABS: 25.0000 mg | ORAL_TABLET | Freq: Every day | ORAL | Status: DC

## 2015-12-28 MED ORDER — LOSARTAN POTASSIUM 100 MG PO TABS: 100.0000 mg | ORAL_TABLET | Freq: Every morning | ORAL | Status: DC

## 2015-12-28 NOTE — Progress Notes (Signed)
   Subjective:    Patient ID: Kimberly [name], female    DOB: 01/29/1951, 65 y.o.   MRN: [id number]  Diabetes She presents for her follow-up diabetic visit. She has type 2 diabetes mellitus. Risk factors for coronary artery disease include diabetes mellitus, post-menopausal and hypertension. Current diabetic treatment includes oral agent (monotherapy). She is compliant with treatment all of the time. Her weight is stable. She is following a diabetic diet. She has not had a previous visit with a dietitian. She does not see a podiatrist.Eye exam is current.   Watching sugars but no longer cks  A1C malfunctioned  Exercise walkingthree times per wk, nile and a half  As needed   BP numbers geenrally good, metopr higher . Claims compliance with blood pressure medication. No obvious side effects. Watching salt intake.  husb has been sick in the hospital   Once per day effecor , compliant with it. Reports its helping her. No major challenges with depression at this time  History of elevated liver function test. Patient concerned about this. Has not been checked in 6 months. Trying to lose weight. Review of Systems No headache, no major weight loss or weight gain, no chest pain no back pain abdominal pain no change in bowel habits complete ROS otherwise negative     Objective:   Physical Exam Alert vitals stable HEENT normal lungs clear heart regular rate and rhythm. Abdominal exam benign ankles without edema feet pulses good sensation intact mental status within normal limits no suicidal or homicidal thoughts       Assessment & Plan:  Impression 1 type 2 diabetes discussed weight A1c result prior results discussed #2 elevated liver enzymes status uncertain we'll check 3 depression clinically stable patient wishes to stick with medication #4 hypertension good control maintain same meds plan diet exercise discussed. 25 minutes spent most discussion, appropriate blood work further  recommendations based results WSL

## 2015-12-29 DIAGNOSIS — F329 Major depressive disorder, single episode, unspecified: Secondary | ICD-10-CM | POA: Insufficient documentation

## 2015-12-29 DIAGNOSIS — F32A Depression, unspecified: Secondary | ICD-10-CM | POA: Insufficient documentation

## 2015-12-29 LAB — HEPATIC FUNCTION PANEL
ALT: 45 IU/L — ABNORMAL HIGH (ref 0–32)
AST: 32 IU/L (ref 0–40)
Albumin: 4.7 g/dL (ref 3.6–4.8)
Alkaline Phosphatase: 92 IU/L (ref 39–117)
Bilirubin Total: 0.3 mg/dL (ref 0.0–1.2)
Bilirubin, Direct: 0.1 mg/dL (ref 0.00–0.40)
TOTAL PROTEIN: 7.5 g/dL (ref 6.0–8.5)

## 2015-12-29 LAB — HEMOGLOBIN A1C
ESTIMATED AVERAGE GLUCOSE: 160 mg/dL
HEMOGLOBIN A1C: 7.2 % — AB (ref 4.8–5.6)

## 2016-01-04 ENCOUNTER — Ambulatory Visit: Payer: 59 | Admitting: Family Medicine

## 2016-01-10 MED FILL — VENLAFAXINE HCL ER 75 MG CA: 75 | 90 days supply | Qty: 90 | Fill #0

## 2016-01-10 MED FILL — LEVOTHYROXINE 137 MCG TAB: 137 | 90 days supply | Qty: 90 | Fill #1

## 2016-01-10 MED FILL — LOSARTAN POTASSIUM 100 MG T: 100 | 30 days supply | Qty: 30 | Fill #1

## 2016-01-11 ENCOUNTER — Encounter: Payer: Self-pay | Admitting: Family Medicine

## 2016-01-25 MED FILL — metFORMIN HCL 500 MG TABS: 500 | 30 days supply | Qty: 60 | Fill #2

## 2016-01-25 MED FILL — DICLOFENAC SOD EC 75 MG TAB: 75 | 30 days supply | Qty: 60 | Fill #3

## 2016-02-11 MED FILL — LOSARTAN POTASSIUM 100 MG T: 100 | 90 days supply | Qty: 90 | Fill #0

## 2016-02-11 MED FILL — HYDROCHLOROTHIAZIDE 25 MG T: 25 | 90 days supply | Qty: 90 | Fill #0

## 2016-03-06 MED FILL — metFORMIN HCL 500 MG TABS: 500 | 30 days supply | Qty: 60 | Fill #3

## 2016-03-06 MED FILL — DICLOFENAC SOD EC 75 MG TAB: 75 | 30 days supply | Qty: 60 | Fill #4

## 2016-03-31 MED FILL — PANTOPRAZOLE SOD DR 40 MG T: 40 | 90 days supply | Qty: 90 | Fill #3

## 2016-03-31 MED FILL — DICLOFENAC SOD EC 75 MG TAB: 75 | 30 days supply | Qty: 60 | Fill #5

## 2016-03-31 MED FILL — VENLAFAXINE HCL ER 75 MG CA: 75 | 90 days supply | Qty: 90 | Fill #1

## 2016-03-31 MED FILL — METOPROLOL SUCC ER 100 MG T: 100 | 90 days supply | Qty: 90 | Fill #2

## 2016-03-31 MED FILL — metFORMIN HCL 500 MG TABS: 500 | 90 days supply | Qty: 180 | Fill #0

## 2016-04-13 MED FILL — LEVOTHYROXINE 137 MCG TAB: 137 | 90 days supply | Qty: 90 | Fill #0

## 2016-05-16 ENCOUNTER — Other Ambulatory Visit: Payer: Self-pay | Admitting: Family Medicine

## 2016-05-16 MED FILL — LOSARTAN POTASSIUM 100 MG T: 100 | 90 days supply | Qty: 90 | Fill #1

## 2016-05-16 MED FILL — DICLOFENAC SOD EC 75 MG TAB: 75 | 30 days supply | Qty: 60 | Fill #0

## 2016-05-16 MED FILL — HYDROCHLOROTHIAZIDE 25 MG T: 25 | 90 days supply | Qty: 90 | Fill #1

## 2016-05-26 ENCOUNTER — Other Ambulatory Visit: Payer: Self-pay | Admitting: Pediatrics

## 2016-05-26 DIAGNOSIS — M5412 Radiculopathy, cervical region: Secondary | ICD-10-CM

## 2016-06-23 ENCOUNTER — Telehealth: Payer: Self-pay | Admitting: Family Medicine

## 2016-06-23 MED ORDER — DICLOFENAC SODIUM 75 MG PO TBEC: 75.0000 mg | DELAYED_RELEASE_TABLET | Freq: Two times a day (BID) | ORAL | 0 refills | Status: DC

## 2016-06-23 MED FILL — DICLOFENAC SOD 75 MG TAB EC: 75 | 90 days supply | Qty: 180 | Fill #0

## 2016-06-23 NOTE — Telephone Encounter (Signed)
Patient said she is going to run out of her diclofenac (VOLTAREN) 75 MG EC tablet before her scheduled appointment on 07/04/16.  She said she wants to be sure that when the pharmacy sends over a refill request, that we send in for a 3 month supply.

## 2016-06-23 NOTE — Telephone Encounter (Signed)
This patient may have a 3 month supply keep office visit

## 2016-06-23 NOTE — Telephone Encounter (Signed)
Prescription sent electronically to pharmacy. Patient notified. 

## 2016-07-04 ENCOUNTER — Encounter: Payer: Self-pay | Admitting: Family Medicine

## 2016-07-04 ENCOUNTER — Ambulatory Visit (INDEPENDENT_AMBULATORY_CARE_PROVIDER_SITE_OTHER): Payer: 59 | Admitting: Family Medicine

## 2016-07-04 VITALS — BP 132/78 | Ht 63.0 in | Wt 195.2 lb

## 2016-07-04 DIAGNOSIS — E119 Type 2 diabetes mellitus without complications: Secondary | ICD-10-CM | POA: Diagnosis not present

## 2016-07-04 DIAGNOSIS — Z13220 Encounter for screening for lipoid disorders: Secondary | ICD-10-CM

## 2016-07-04 DIAGNOSIS — E039 Hypothyroidism, unspecified: Secondary | ICD-10-CM | POA: Diagnosis not present

## 2016-07-04 DIAGNOSIS — I10 Essential (primary) hypertension: Secondary | ICD-10-CM | POA: Diagnosis not present

## 2016-07-04 DIAGNOSIS — Z79899 Other long term (current) drug therapy: Secondary | ICD-10-CM

## 2016-07-04 LAB — POCT GLYCOSYLATED HEMOGLOBIN (HGB A1C): Hemoglobin A1C: 6.3

## 2016-07-04 MED ORDER — LEVOTHYROXINE SODIUM 137 MCG PO TABS: 137.0000 ug | ORAL_TABLET | Freq: Every day | ORAL | 1 refills | Status: DC

## 2016-07-04 MED ORDER — HYDROCHLOROTHIAZIDE 25 MG PO TABS: 25.0000 mg | ORAL_TABLET | Freq: Every day | ORAL | 1 refills | Status: DC

## 2016-07-04 MED ORDER — LOSARTAN POTASSIUM 100 MG PO TABS: 100.0000 mg | ORAL_TABLET | Freq: Every morning | ORAL | 1 refills | Status: DC

## 2016-07-04 MED ORDER — VENLAFAXINE HCL ER 75 MG PO CP24: 75.0000 mg | ORAL_CAPSULE | Freq: Every day | ORAL | 1 refills | Status: DC

## 2016-07-04 MED ORDER — METFORMIN HCL 500 MG PO TABS: ORAL_TABLET | ORAL | 1 refills | Status: DC

## 2016-07-04 MED ORDER — PANTOPRAZOLE SODIUM 40 MG PO TBEC: 40.0000 mg | DELAYED_RELEASE_TABLET | Freq: Every day | ORAL | 1 refills | Status: DC

## 2016-07-04 NOTE — Progress Notes (Signed)
   Subjective:    Patient ID: Kimberly [name], female    DOB: 1951/03/10, 65 y.o.   MRN: [id number] Patient arrives office for evaluation of numerous concerns Diabetes  She presents for her follow-up diabetic visit. She has type 2 diabetes mellitus. No MedicAlert identification noted. She has not had a previous visit with a dietitian. She does not see a podiatrist.Eye exam is current.   Blood pressure medicine and blood pressure levels reviewed today with patient. Compliant with blood pressure medicine. States does not miss a dose. No obvious side effects. Blood pressure generally good when checked elsewhere. Watching salt intake.   Patient has hypothyroidism. Compliant with medications. No obvious side effects. Takes faithfully. Prior blood work reviewed.  Reports Effexor overall supple mood. Definitely wants to stay on it. No noticeable side effects next  Working hard on diet and has lost some weight Patient states no other concerns this visit.  Results for orders placed or performed in visit on 07/04/16  POCT HgB A1C  Result Value Ref Range   Hemoglobin A1C 6.3    Numbers overall better  Ha lost twelve pounds  Feeling better  Three d per wk with exdrcise,  Work is full time, ready to retire  Due for eye doc  Getting eye glass coverage       Review of Systems No headache, no major weight loss or weight gain, no chest pain no back pain abdominal pain no change in bowel habits complete ROS otherwise negative     Objective:   Physical Exam  Alert vitals stable, NAD. Blood pressure good on repeat. HEENT normal. Lungs clear. Heart regular rate and rhythm.   Ankles without edema C diabetic foot exam      Assessment & Plan:  Impression 1 type 2 diabetes good control discussed maintain same meds #2 hypertension good control discussed maintain same meds #3 hypothyroidism status uncertain check blood work #4 depression/anxiety clinically stable. Appropriate blood  work. Flu shot are given. Diet exercise discussed. Follow-up as scheduled patient wishes to be follow-up sooner because of insurance realities

## 2016-07-13 MED FILL — PANTOPRAZOLE SOD DR 40 MG T: 40 | 90 days supply | Qty: 90 | Fill #0

## 2016-07-13 MED FILL — metFORMIN HCL 500 MG TABS: 500 | 90 days supply | Qty: 180 | Fill #0

## 2016-07-13 MED FILL — VENLAFAXINE HCL ER 75 MG CA: 75 | 90 days supply | Qty: 90 | Fill #0

## 2016-07-13 MED FILL — LEVOTHYROXINE 137 MCG TAB: 137 | 90 days supply | Qty: 90 | Fill #0

## 2016-08-14 MED FILL — LOSARTAN POTASSIUM 100 MG T: 100 | 90 days supply | Qty: 90 | Fill #0

## 2016-08-14 MED FILL — HYDROCHLOROTHIAZIDE 25 MG T: 25 | 90 days supply | Qty: 90 | Fill #0

## 2016-09-05 ENCOUNTER — Ambulatory Visit (INDEPENDENT_AMBULATORY_CARE_PROVIDER_SITE_OTHER): Payer: 59

## 2016-09-05 ENCOUNTER — Ambulatory Visit (INDEPENDENT_AMBULATORY_CARE_PROVIDER_SITE_OTHER): Payer: 59 | Admitting: Orthopedic Surgery

## 2016-09-05 ENCOUNTER — Encounter: Payer: Self-pay | Admitting: Orthopedic Surgery

## 2016-09-05 DIAGNOSIS — Z96652 Presence of left artificial knee joint: Secondary | ICD-10-CM

## 2016-09-05 DIAGNOSIS — Z96651 Presence of right artificial knee joint: Secondary | ICD-10-CM

## 2016-09-05 DIAGNOSIS — Z96653 Presence of artificial knee joint, bilateral: Secondary | ICD-10-CM | POA: Diagnosis not present

## 2016-09-05 NOTE — Progress Notes (Signed)
Patient ID: Kimberly [name], female   DOB: 12/03/1950, 66 y.o.   MRN: [id number]  Post op annual TKA   Chief Complaint  Patient presents with  . Follow-up    BILATERAL TKA, LT 2016, RT 2014    HPI Kimberly [name] is a 66 y.o. female.  COMES IN FOR ANNUAL FU BOTH KNEES AFTER BILATERAL TKA S  SHE HAS NO COMPLAINTS   SHE IS WALKING NORMALLY AND MOVING WELL. SHE DOES ALL ADLS NORMALLY   Left knee was done in 2016 right in 2014   Past Medical History:  Diagnosis Date  . Arthritis   . Depression   . Diabetes mellitus   . High blood pressure   . Hypothyroid   . Obesity   . PONV (postoperative nausea and vomiting)     Past Surgical History:  Procedure Laterality Date  . ABDOMINAL HYSTERECTOMY    . Bunions Right   . CARPAL TUNNEL RELEASE Right   . KNEE ARTHROSCOPY Right   . THYROID SURGERY     removal  . TOTAL KNEE ARTHROPLASTY Right 08/25/2013   Procedure: TOTAL KNEE ARTHROPLASTY;  Surgeon: Carole Civil, MD;  Location: [facility];  Service: Orthopedics;  Laterality: Right;  . TOTAL KNEE ARTHROPLASTY Left 09/09/2014   Procedure: LEFT TOTAL KNEE ARTHROPLASTY;  Surgeon: Carole Civil, MD;  Location: [facility];  Service: Orthopedics;  Laterality: Left;     Allergies  Allergen Reactions  . Ace Inhibitors Cough    Current Outpatient Prescriptions  Medication Sig Dispense Refill  . aspirin 81 MG tablet Take 81 mg by mouth daily.    . diclofenac (VOLTAREN) 75 MG EC tablet Take 1 tablet (75 mg total) by mouth 2 (two) times daily. 180 tablet 0  . hydrochlorothiazide (HYDRODIURIL) 25 MG tablet Take 1 tablet (25 mg total) by mouth daily. 90 tablet 1  . levothyroxine (SYNTHROID, LEVOTHROID) 137 MCG tablet Take 1 tablet (137 mcg total) by mouth daily before breakfast. 90 tablet 1  . losartan (COZAAR) 100 MG tablet Take 1 tablet (100 mg total) by mouth every morning. 90 tablet 1  . metFORMIN (GLUCOPHAGE) 500 MG tablet Take 1 tablet BY MOUTH  TWICE DAILY WITH A MEAL 180 tablet  1  . metoprolol succinate (TOPROL-XL) 100 MG 24 hr tablet Take 1 tablet (100 mg total) by mouth at bedtime. 90 tablet 3  . pantoprazole (PROTONIX) 40 MG tablet Take 1 tablet (40 mg total) by mouth daily. 90 tablet 1  . venlafaxine XR (EFFEXOR XR) 75 MG 24 hr capsule Take 1 capsule (75 mg total) by mouth daily with breakfast. 90 capsule 1   No current facility-administered medications for this visit.    Facility-Administered Medications Ordered in Other Visits  Medication Dose Route Frequency Provider Last Rate Last Dose  . bupivacaine liposome (EXPAREL) 1.3 % injection 266 mg  20 mL Infiltration Once Carole Civil, MD        Review of Systems Review of Systems  Constitutional: Negative for fever.  Musculoskeletal: Negative for gait problem.  Neurological: Negative for weakness.     Physical Exam There were no vitals taken for this visit.   Gen. appearance is normal there are no congenital abnormalities   The patient is oriented 3   Mood and affect are normal   Ambulation is without assistive device   RIGHTKnee inspection reveals a well-healed incision with no swelling   Knee flexion 115  Stability in the anteroposterior plane is normal as well as  in the medial lateral plane  Motor exam reveals full extension without extensor lag  LEFT KNEE INSPECTION REVEALS WELL-HEALED INCISIONAL SWELLING KNEE FLEXION 120. sTABILITY IN ap PLANE AND MEDIAL LATERAL PLANE IS NORMAL Motor exam notes since arriving in full extension is tested.   Data Reviewed KNEE XRAYS :  Radiology report  3 views rt and left  knee  AP lateral and patellar sunrise views of the knee  FINDINGS: A total knee prosthesis is noted. It is in anatomic alignment. There is no evidence of loosening.  Impression : normal TKA  xray  Assessment S/P TKA   Plan    Return 1 year xr both knees        Arther Abbott 09/05/2016, 11:05 AM

## 2016-09-28 MED FILL — PANTOPRAZOLE SOD DR 40 MG T: 40 | 90 days supply | Qty: 90 | Fill #1

## 2016-09-28 MED FILL — VENLAFAXINE HCL ER 75 MG CA: 75 | 90 days supply | Qty: 90 | Fill #1

## 2016-09-28 MED FILL — LEVOTHYROXINE 137 MCG TAB: 137 | 90 days supply | Qty: 90 | Fill #1

## 2016-10-03 DIAGNOSIS — E119 Type 2 diabetes mellitus without complications: Secondary | ICD-10-CM | POA: Diagnosis not present

## 2016-10-03 DIAGNOSIS — H35033 Hypertensive retinopathy, bilateral: Secondary | ICD-10-CM | POA: Diagnosis not present

## 2016-10-03 DIAGNOSIS — E1136 Type 2 diabetes mellitus with diabetic cataract: Secondary | ICD-10-CM | POA: Diagnosis not present

## 2016-10-03 DIAGNOSIS — H25813 Combined forms of age-related cataract, bilateral: Secondary | ICD-10-CM | POA: Diagnosis not present

## 2016-10-03 DIAGNOSIS — I10 Essential (primary) hypertension: Secondary | ICD-10-CM | POA: Diagnosis not present

## 2016-10-03 DIAGNOSIS — H52221 Regular astigmatism, right eye: Secondary | ICD-10-CM | POA: Diagnosis not present

## 2016-10-03 DIAGNOSIS — H3500 Unspecified background retinopathy: Secondary | ICD-10-CM | POA: Diagnosis not present

## 2016-10-03 DIAGNOSIS — H524 Presbyopia: Secondary | ICD-10-CM | POA: Diagnosis not present

## 2016-10-03 DIAGNOSIS — H5203 Hypermetropia, bilateral: Secondary | ICD-10-CM | POA: Diagnosis not present

## 2016-10-05 LAB — HM DIABETES EYE EXAM

## 2016-10-12 ENCOUNTER — Encounter: Payer: Self-pay | Admitting: Family Medicine

## 2016-10-12 ENCOUNTER — Ambulatory Visit (INDEPENDENT_AMBULATORY_CARE_PROVIDER_SITE_OTHER): Payer: 59 | Admitting: Family Medicine

## 2016-10-12 VITALS — BP 132/84 | Ht 63.0 in | Wt 199.0 lb

## 2016-10-12 DIAGNOSIS — E039 Hypothyroidism, unspecified: Secondary | ICD-10-CM

## 2016-10-12 DIAGNOSIS — E7800 Pure hypercholesterolemia, unspecified: Secondary | ICD-10-CM | POA: Diagnosis not present

## 2016-10-12 DIAGNOSIS — Z79899 Other long term (current) drug therapy: Secondary | ICD-10-CM

## 2016-10-12 DIAGNOSIS — I10 Essential (primary) hypertension: Secondary | ICD-10-CM | POA: Diagnosis not present

## 2016-10-12 DIAGNOSIS — E119 Type 2 diabetes mellitus without complications: Secondary | ICD-10-CM

## 2016-10-12 LAB — POCT GLYCOSYLATED HEMOGLOBIN (HGB A1C): HEMOGLOBIN A1C: 6

## 2016-10-12 NOTE — Progress Notes (Signed)
   Subjective:    Patient ID: Kimberly [name], female    DOB: March 17, 1951, 66 y.o.   MRN: [id number]  Diabetes  She presents for her follow-up diabetic visit. She has type 2 diabetes mellitus. Home blood sugar record trend: am sugars 150 -160, pm sugars 120's. She does not see a podiatrist.Eye exam is current (last week).  pt did not do bloodwork that was ordered in November.  Patient claims compliance with diabetes medication. No obvious side effects. Reports no substantial low sugar spells. Most numbers are generally in good range when checked fasting. Generally does not miss a dose of medication. Watching diabetic diet closely  Blood pressure medicine and blood pressure levels reviewed today with patient. Compliant with blood pressure medicine. States does not miss a dose. No obvious side effects. Blood pressure generally good when checked elsewhere. Watching salt intake.   Patient notes ongoing compliance with antidepressant medication. No obvious side effects. Reports does not miss a dose. Overall continues to help depression substantially. No thoughts of homicide or suicide. Would like to maintain medication.  Patient claims compliance with thyroid medication. No symptoms of high or low thyroid. A1C today 6.0.  Pt states no concerns today.    Review of Systems No headache, no major weight loss or weight gain, no chest pain no back pain abdominal pain no change in bowel habits complete ROS otherwise negative     Objective:   Physical Exam  Alert vitals stable, NAD. Blood pressure good on repeat. HEENT normal. Lungs clear. Heart regular rate and rhythm.   C diabetic foot exam      Assessment & Plan:  Impression 1 type 2 diabetes discussed good control maintain same medication #2 hypertension good control discussed maintain same #3 hyperlipidemia prior blood work reviewed to maintain same approach pending results. #4 depression clinically stable to maintain meds #5  hypothyroidism current status uncertain patient claims compliance no symptoms of hyper low thyroid. LAD all the above plus diet exercise discussed further recommendations based results

## 2016-10-13 ENCOUNTER — Other Ambulatory Visit: Payer: Self-pay | Admitting: *Deleted

## 2016-10-13 ENCOUNTER — Other Ambulatory Visit: Payer: Self-pay | Admitting: Cardiovascular Disease

## 2016-10-13 MED ORDER — METOPROLOL SUCCINATE ER 100 MG PO TB24: 100.0000 mg | ORAL_TABLET | Freq: Every day | ORAL | 1 refills | Status: DC

## 2016-10-13 MED FILL — DICLOFENAC SOD 75 MG TAB EC: 75 | 30 days supply | Qty: 60 | Fill #1

## 2016-10-13 MED FILL — METOPROLOL SUCC ER 100 MG T: 100 | 90 days supply | Qty: 90 | Fill #0

## 2016-10-13 NOTE — Telephone Encounter (Signed)
Patient has not been seen since 08/02/15 but is prn follow up. Okay to refill of should this be deferred to patients pcp? Please advise. Thanks, MI

## 2016-10-13 NOTE — Progress Notes (Signed)
Ok reef times six

## 2016-10-13 NOTE — Progress Notes (Signed)
Refills sent to pharm

## 2016-10-13 NOTE — Telephone Encounter (Signed)
Please see if primary care will refill as they have been following her.

## 2016-10-18 DIAGNOSIS — E7800 Pure hypercholesterolemia, unspecified: Secondary | ICD-10-CM | POA: Diagnosis not present

## 2016-10-18 DIAGNOSIS — E119 Type 2 diabetes mellitus without complications: Secondary | ICD-10-CM | POA: Diagnosis not present

## 2016-10-18 DIAGNOSIS — Z79899 Other long term (current) drug therapy: Secondary | ICD-10-CM | POA: Diagnosis not present

## 2016-10-19 LAB — HEPATIC FUNCTION PANEL
ALT: 42 IU/L — AB (ref 0–32)
AST: 27 IU/L (ref 0–40)
Albumin: 4.5 g/dL (ref 3.6–4.8)
Alkaline Phosphatase: 90 IU/L (ref 39–117)
BILIRUBIN TOTAL: 0.4 mg/dL (ref 0.0–1.2)
BILIRUBIN, DIRECT: 0.12 mg/dL (ref 0.00–0.40)
Total Protein: 7.3 g/dL (ref 6.0–8.5)

## 2016-10-19 LAB — LIPID PANEL
CHOLESTEROL TOTAL: 166 mg/dL (ref 100–199)
Chol/HDL Ratio: 4 (ref 0.0–4.4)
HDL: 41 mg/dL (ref >39)
LDL Calculated: 95 (ref 0–99)
TRIGLYCERIDES: 149 mg/dL (ref 0–149)
VLDL Cholesterol Cal: 30 (ref 5–40)

## 2016-10-19 LAB — MICROALBUMIN / CREATININE URINE RATIO
Creatinine, Urine: 167.6 mg/dL
Microalb/Creat Ratio: 5.3 (ref 0.0–30.0)
Microalbumin, Urine: 8.8 ug/mL

## 2016-10-19 LAB — BASIC METABOLIC PANEL
BUN / CREAT RATIO: 25 (ref 12–28)
BUN: 25 mg/dL (ref 8–27)
CHLORIDE: 98 mmol/L (ref 96–106)
CO2: 24 mmol/L (ref 18–29)
Calcium: 9.8 mg/dL (ref 8.7–10.3)
Creatinine, Ser: 0.99 mg/dL (ref 0.57–1.00)
GFR calc non Af Amer: 60 (ref >59)
GFR, EST AFRICAN AMERICAN: 70 (ref >59)
Glucose: 164 mg/dL — ABNORMAL HIGH (ref 65–99)
POTASSIUM: 4.2 mmol/L (ref 3.5–5.2)
Sodium: 140 mmol/L (ref 134–144)

## 2016-10-29 ENCOUNTER — Encounter: Payer: Self-pay | Admitting: Family Medicine

## 2016-11-13 ENCOUNTER — Telehealth: Payer: Self-pay | Admitting: Family Medicine

## 2016-11-13 MED ORDER — HYDROCHLOROTHIAZIDE 25 MG PO TABS: 25.0000 mg | ORAL_TABLET | Freq: Every day | ORAL | 1 refills | Status: DC

## 2016-11-13 MED ORDER — VENLAFAXINE HCL ER 75 MG PO CP24: 75.0000 mg | ORAL_CAPSULE | Freq: Every day | ORAL | 1 refills | Status: DC

## 2016-11-13 MED ORDER — LEVOTHYROXINE SODIUM 137 MCG PO TABS: 137.0000 ug | ORAL_TABLET | Freq: Every day | ORAL | 1 refills | Status: DC

## 2016-11-13 MED ORDER — METFORMIN HCL 500 MG PO TABS: ORAL_TABLET | ORAL | 1 refills | Status: DC

## 2016-11-13 MED ORDER — LOSARTAN POTASSIUM 100 MG PO TABS: 100.0000 mg | ORAL_TABLET | Freq: Every morning | ORAL | 1 refills | Status: DC

## 2016-11-13 MED ORDER — PANTOPRAZOLE SODIUM 40 MG PO TBEC: 40.0000 mg | DELAYED_RELEASE_TABLET | Freq: Every day | ORAL | 1 refills | Status: DC

## 2016-11-13 MED ORDER — METOPROLOL SUCCINATE ER 100 MG PO TB24: 100.0000 mg | ORAL_TABLET | Freq: Every day | ORAL | 1 refills | Status: DC

## 2016-11-13 NOTE — Telephone Encounter (Signed)
Prescriptions sent electronically to pharmacy. Patient notified. °

## 2016-11-13 NOTE — Telephone Encounter (Signed)
Pt is requesting refills on all of her current medications to be sent into her new mail order pharmacy. She is needing 90 day supplies sent in.     [facility]

## 2016-12-21 ENCOUNTER — Telehealth: Payer: Self-pay | Admitting: Family Medicine

## 2016-12-21 NOTE — Telephone Encounter (Signed)
Patient requesting refill on effexor XR 75 mg 90 day supply with refills called into mail order .

## 2016-12-22 ENCOUNTER — Other Ambulatory Visit: Payer: Self-pay | Admitting: Nurse Practitioner

## 2016-12-22 MED ORDER — VENLAFAXINE HCL ER 75 MG PO CP24: 75.0000 mg | ORAL_CAPSULE | Freq: Every day | ORAL | 1 refills | Status: DC

## 2016-12-22 NOTE — Telephone Encounter (Signed)
done

## 2017-01-01 ENCOUNTER — Other Ambulatory Visit: Payer: Self-pay | Admitting: Family Medicine

## 2017-01-01 MED ORDER — DICLOFENAC SODIUM 75 MG PO TBEC: 75.0000 mg | DELAYED_RELEASE_TABLET | Freq: Two times a day (BID) | ORAL | 1 refills | Status: DC

## 2017-01-01 NOTE — Telephone Encounter (Signed)
Prescription sent electronically to pharmacy. Patient notified. 

## 2017-01-01 NOTE — Telephone Encounter (Signed)
Ok lus three ref

## 2017-01-01 NOTE — Telephone Encounter (Signed)
Patient is requesting refill diclofenac 75 mg a 90 day supply with refills called into [facility] pharmacy

## 2017-01-28 IMAGING — US US ABDOMEN LIMITED
1 series · 14 of 25 positions shown · non-contrast
Comparison: None.

CLINICAL DATA: Elevated liver enzymes

EXAM:
US ABDOMEN LIMITED - RIGHT UPPER QUADRANT

[Series 1: us abdomen limited · 0.21mm/px · 14 of 40 slices shown]
[im 1/40]
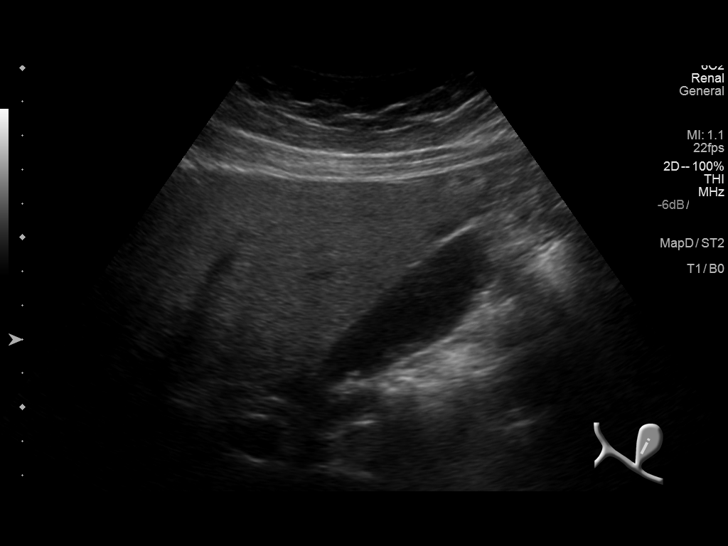
[im 4/40]
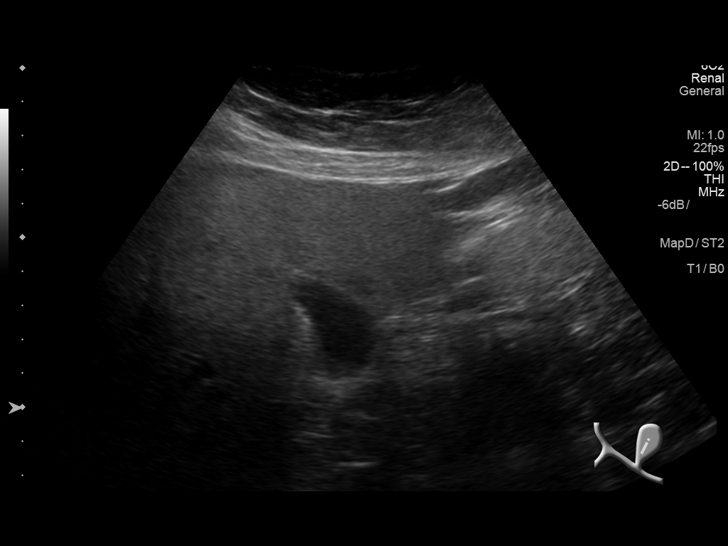
[im 7/40]
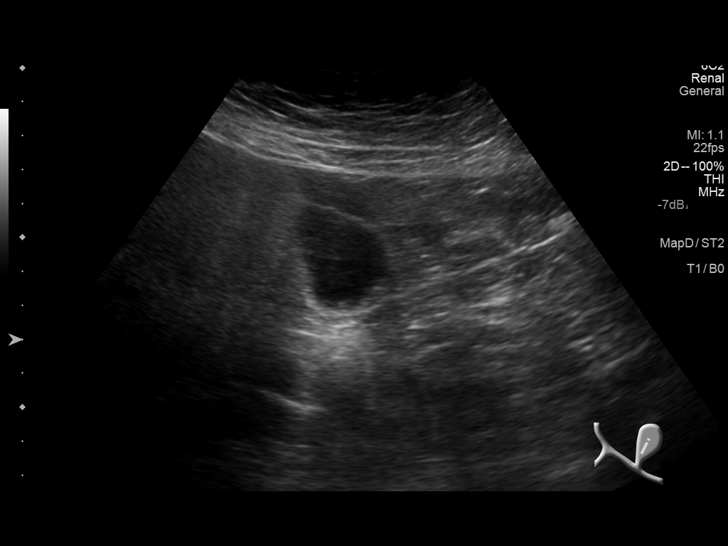
[im 10/40]
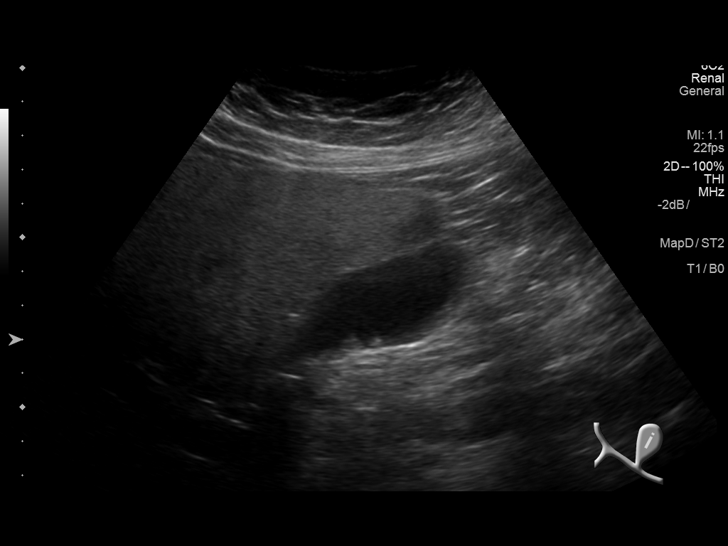
[im 14/40]
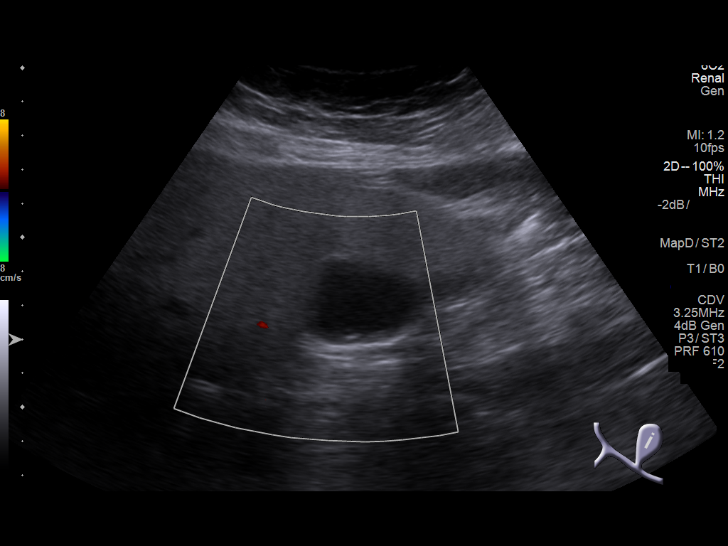
[im 15/40]
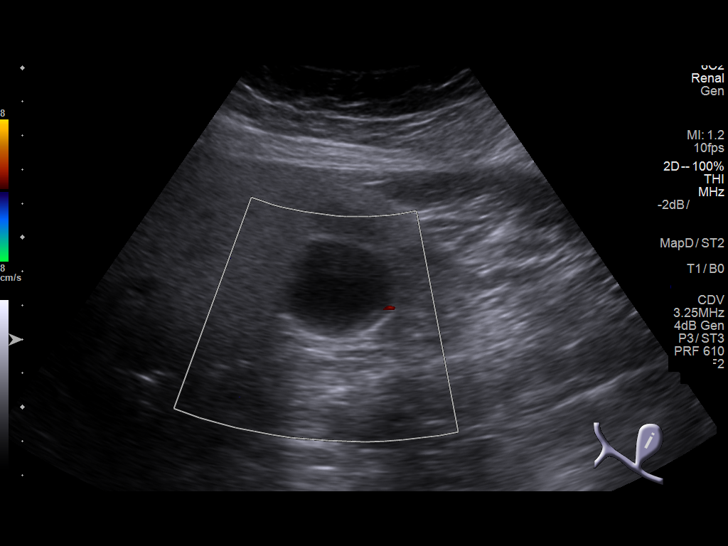
[im 18/40]
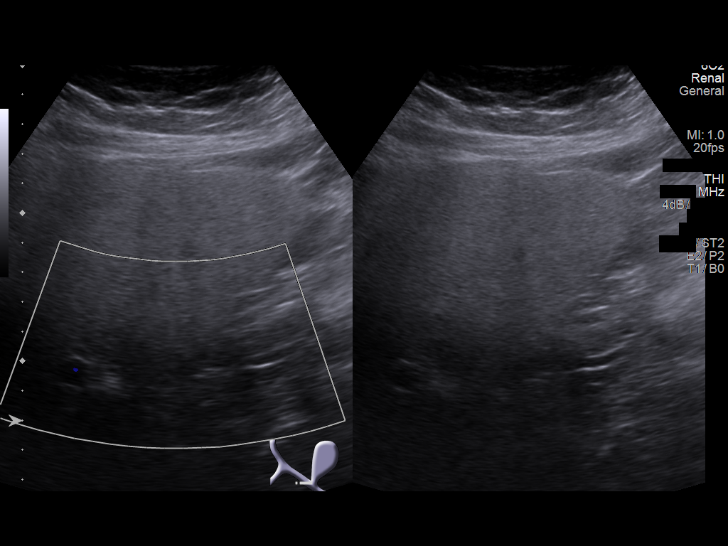
[im 22/40]
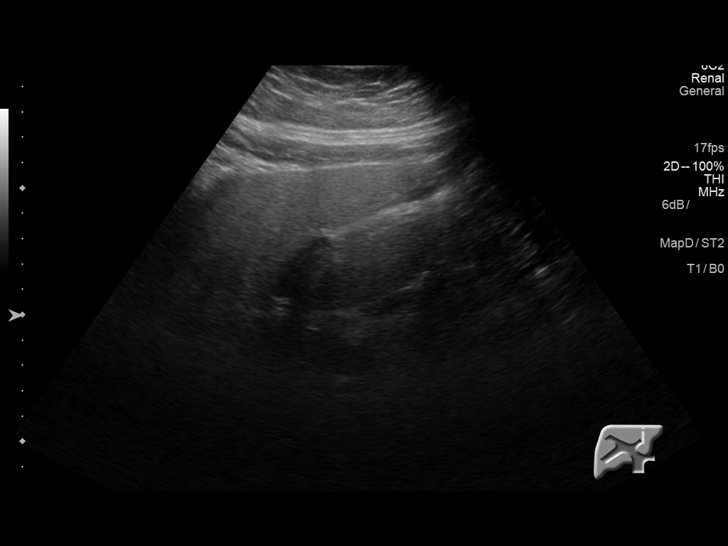
[im 25/40]
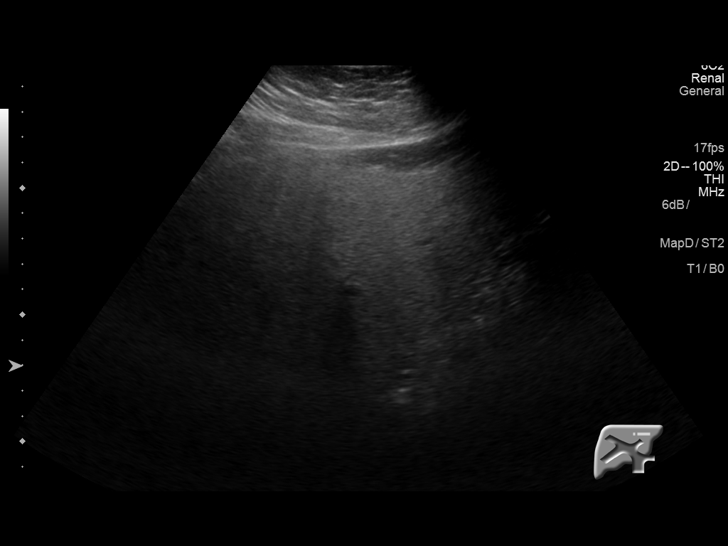
[im 27/40]
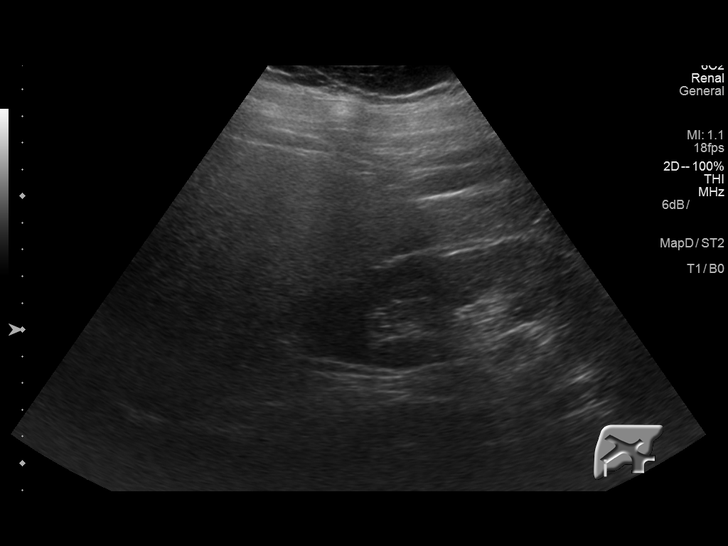
[im 30/40]
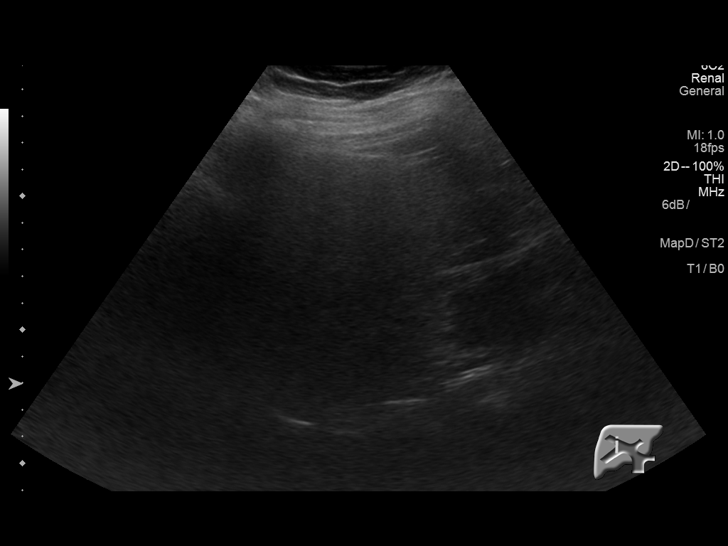
[im 33/40]
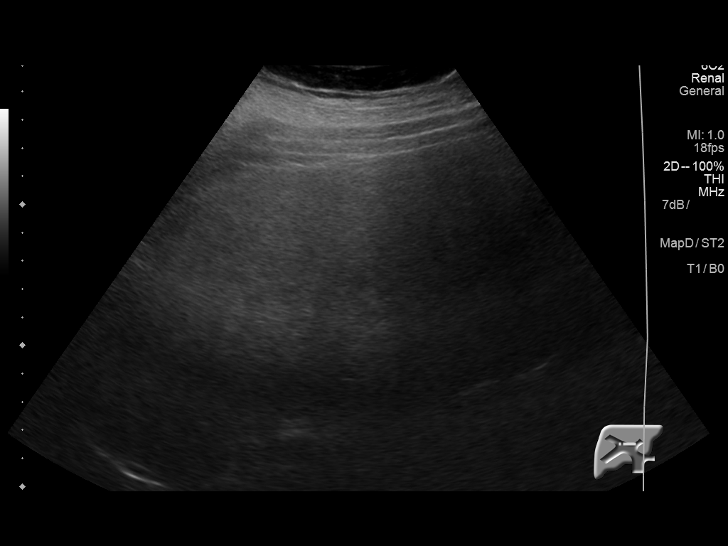
[im 36/40]
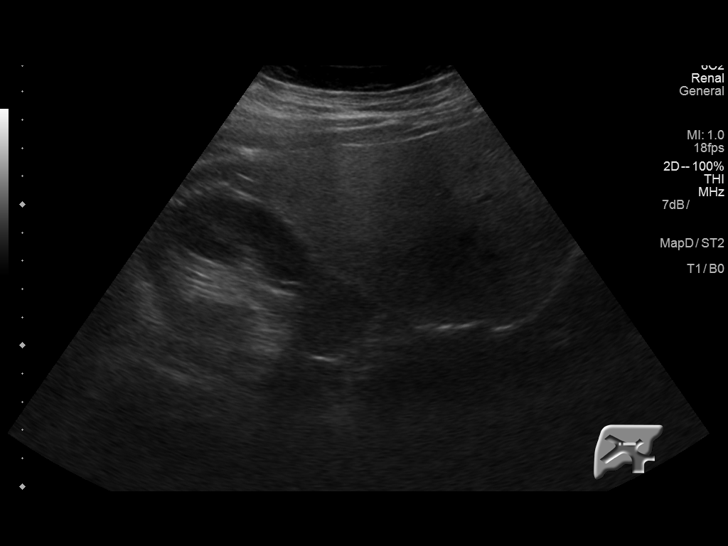
[im 40/40]
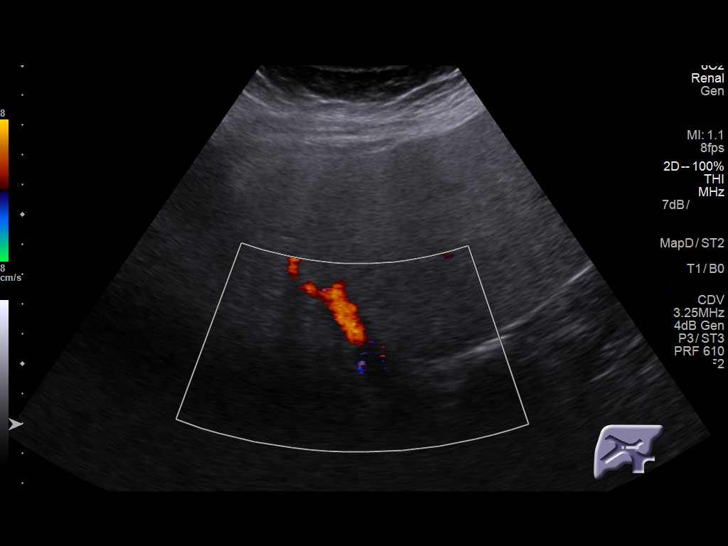

[14 of 25 positions shown; findings below may reference images not displayed]

FINDINGS: Gallbladder:

Rounded mobile echogenic structures, at least 2, consistent with
calculi. There is a subtle mildly echogenic, non shadowing structure
on anterior gallbladder wall measuring 5 mm.

Common bile duct:

Diameter: 4 mm were visualized.

Liver:

Echogenic liver with poor acoustic transmission and sparing along
the lower gallbladder fossa. No focal lesion is seen. Antegrade flow
in the imaged hepatic and portal venous system.
IMPRESSION: 1. Hepatic steatosis.
2. Non shadowing cholelithiasis and probable 5 mm gallbladder polyp.

## 2017-04-11 ENCOUNTER — Ambulatory Visit: Payer: Medicare Other | Admitting: Family Medicine

## 2017-05-21 ENCOUNTER — Other Ambulatory Visit: Payer: Self-pay | Admitting: Family Medicine

## 2017-05-23 ENCOUNTER — Ambulatory Visit: Payer: Medicare Other | Admitting: Family Medicine

## 2017-05-29 ENCOUNTER — Ambulatory Visit: Payer: Medicare Other | Admitting: Family Medicine

## 2017-05-30 ENCOUNTER — Encounter: Payer: Self-pay | Admitting: Family Medicine

## 2017-05-30 ENCOUNTER — Ambulatory Visit (INDEPENDENT_AMBULATORY_CARE_PROVIDER_SITE_OTHER): Payer: Medicare Other | Admitting: Family Medicine

## 2017-05-30 VITALS — BP 150/88 | Ht 63.0 in | Wt 207.0 lb

## 2017-05-30 DIAGNOSIS — F320 Major depressive disorder, single episode, mild: Secondary | ICD-10-CM | POA: Diagnosis not present

## 2017-05-30 DIAGNOSIS — Z23 Encounter for immunization: Secondary | ICD-10-CM

## 2017-05-30 DIAGNOSIS — E119 Type 2 diabetes mellitus without complications: Secondary | ICD-10-CM

## 2017-05-30 DIAGNOSIS — E7800 Pure hypercholesterolemia, unspecified: Secondary | ICD-10-CM

## 2017-05-30 LAB — POCT GLYCOSYLATED HEMOGLOBIN (HGB A1C): HEMOGLOBIN A1C: 6.7

## 2017-05-30 MED ORDER — LEVOTHYROXINE SODIUM 137 MCG PO TABS: 137.0000 ug | ORAL_TABLET | Freq: Every day | ORAL | 1 refills | Status: DC

## 2017-05-30 MED ORDER — METOPROLOL SUCCINATE ER 100 MG PO TB24: 100.0000 mg | ORAL_TABLET | Freq: Every day | ORAL | 1 refills | Status: DC

## 2017-05-30 MED ORDER — PANTOPRAZOLE SODIUM 40 MG PO TBEC: 40.0000 mg | DELAYED_RELEASE_TABLET | Freq: Every day | ORAL | 1 refills | Status: DC

## 2017-05-30 MED ORDER — DICLOFENAC SODIUM 75 MG PO TBEC: 75.0000 mg | DELAYED_RELEASE_TABLET | Freq: Two times a day (BID) | ORAL | 1 refills | Status: DC

## 2017-05-30 MED ORDER — HYDROCHLOROTHIAZIDE 25 MG PO TABS: 25.0000 mg | ORAL_TABLET | Freq: Every day | ORAL | 5 refills | Status: DC

## 2017-05-30 MED ORDER — VENLAFAXINE HCL ER 75 MG PO CP24: 75.0000 mg | ORAL_CAPSULE | Freq: Every day | ORAL | 1 refills | Status: DC

## 2017-05-30 MED ORDER — METFORMIN HCL 500 MG PO TABS: ORAL_TABLET | ORAL | 1 refills | Status: DC

## 2017-05-30 MED ORDER — LOSARTAN POTASSIUM 100 MG PO TABS: 100.0000 mg | ORAL_TABLET | Freq: Every morning | ORAL | 5 refills | Status: DC

## 2017-05-30 NOTE — Progress Notes (Signed)
   Subjective:    Patient ID: Kimberly [name], female    DOB: December 17, 1950, 66 y.o.   MRN: [id number]  Diabetes  She has type 2 diabetes mellitus.  She is on Metformin 500 mg BID.Eats healthy,and gets very little exercise.Sees eye Dr Freda Munro.Does not see podiatrist.  Patient claims compliance with diabetes medication. No obvious side effects. Reports no substantial low sugar spells. Most numbers are generally in good range when checked fasting. Generally does not miss a dose of medication. Watching diabetic diet closely     Numb 128 or so,  Working part time    Patient notes ongoing compliance with antidepressant medication. No obvious side effects. Reports does not miss a dose. Overall continues to help depression substantially. No thoughts of homicide or suicide. Would like to maintain medication.   Lots of pers stress these days   Blood pressure medicine and blood pressure levels reviewed today with patient. Compliant with blood pressure medicine. States does not miss a dose. No obvious side effects. Blood pressure generally good when checked elsewhere. Watching salt intake.   Exercise not very much at all withall the fam stress   Eye dr vivsitlast yr, no diab trouble    Developed sking allergy to grasses    Review of Systems Results for orders placed or performed in visit on 05/30/17  POCT glycosylated hemoglobin (Hb A1C)  Result Value Ref Range   Hemoglobin A1C 6.7        Objective:   Physical Exam  Alert and oriented, vitals reviewed and stable, NAD ENT-TM's and ext canals WNL bilat via otoscopic exam Soft palate, tonsils and post pharynx WNL via oropharyngeal exam Neck-symmetric, no masses; thyroid nonpalpable and nontender Pulmonary-no tachypnea or accessory muscle use; Clear without wheezes via auscultation Card--no abnrml murmurs, rhythm reg and rate WNL Carotid pulses symmetric, without bruits       Assessment & Plan:  Impression 1 hypertension  improved control repeat to maintain same paragraph #2 type 2 diabetes discussed maintain same therapy A1c good  #3 epression clinically stable current meds discussed maintain same  #4 hypothyroidism claims compliance meds no obvious side effects  Flu s given for patient. Also will do Prevnar

## 2017-06-07 ENCOUNTER — Other Ambulatory Visit: Payer: Self-pay

## 2017-06-07 MED ORDER — METFORMIN HCL 500 MG PO TABS: ORAL_TABLET | ORAL | 1 refills | Status: DC

## 2017-06-07 MED ORDER — HYDROCHLOROTHIAZIDE 25 MG PO TABS: 25.0000 mg | ORAL_TABLET | Freq: Every day | ORAL | 5 refills | Status: DC

## 2017-06-07 MED ORDER — DICLOFENAC SODIUM 75 MG PO TBEC: 75.0000 mg | DELAYED_RELEASE_TABLET | Freq: Two times a day (BID) | ORAL | 1 refills | Status: DC

## 2017-06-07 MED ORDER — PANTOPRAZOLE SODIUM 40 MG PO TBEC: 40.0000 mg | DELAYED_RELEASE_TABLET | Freq: Every day | ORAL | 1 refills | Status: DC

## 2017-06-07 MED ORDER — VENLAFAXINE HCL ER 75 MG PO CP24: 75.0000 mg | ORAL_CAPSULE | Freq: Every day | ORAL | 1 refills | Status: DC

## 2017-06-07 MED ORDER — METOPROLOL SUCCINATE ER 100 MG PO TB24: 100.0000 mg | ORAL_TABLET | Freq: Every day | ORAL | 1 refills | Status: DC

## 2017-06-07 MED ORDER — LEVOTHYROXINE SODIUM 137 MCG PO TABS: 137.0000 ug | ORAL_TABLET | Freq: Every day | ORAL | 1 refills | Status: DC

## 2017-06-07 MED ORDER — LOSARTAN POTASSIUM 100 MG PO TABS: 100.0000 mg | ORAL_TABLET | Freq: Every morning | ORAL | 5 refills | Status: DC

## 2017-06-21 ENCOUNTER — Other Ambulatory Visit: Payer: Self-pay | Admitting: *Deleted

## 2017-06-21 MED ORDER — VENLAFAXINE HCL ER 75 MG PO CP24: 75.0000 mg | ORAL_CAPSULE | Freq: Every day | ORAL | 1 refills | Status: DC

## 2017-06-21 MED ORDER — LOSARTAN POTASSIUM 100 MG PO TABS: 100.0000 mg | ORAL_TABLET | Freq: Every morning | ORAL | 5 refills | Status: DC

## 2017-06-21 MED ORDER — METFORMIN HCL 500 MG PO TABS
ORAL_TABLET | ORAL | 1 refills | Status: DC
Start: 2017-06-21 — End: 2017-12-26

## 2017-06-21 MED ORDER — DICLOFENAC SODIUM 75 MG PO TBEC: 75.0000 mg | DELAYED_RELEASE_TABLET | Freq: Two times a day (BID) | ORAL | 1 refills | Status: DC

## 2017-06-21 MED ORDER — METOPROLOL SUCCINATE ER 100 MG PO TB24: 100.0000 mg | ORAL_TABLET | Freq: Every day | ORAL | 1 refills | Status: DC

## 2017-06-21 MED ORDER — PANTOPRAZOLE SODIUM 40 MG PO TBEC: 40.0000 mg | DELAYED_RELEASE_TABLET | Freq: Every day | ORAL | 1 refills | Status: DC

## 2017-06-21 MED ORDER — LEVOTHYROXINE SODIUM 137 MCG PO TABS: 137.0000 ug | ORAL_TABLET | Freq: Every day | ORAL | 1 refills | Status: DC

## 2017-06-21 MED ORDER — HYDROCHLOROTHIAZIDE 25 MG PO TABS: 25.0000 mg | ORAL_TABLET | Freq: Every day | ORAL | 5 refills | Status: DC

## 2017-06-22 ENCOUNTER — Telehealth: Payer: Self-pay | Admitting: *Deleted

## 2017-06-22 MED ORDER — HYDROCHLOROTHIAZIDE 25 MG PO TABS: 25.0000 mg | ORAL_TABLET | Freq: Every day | ORAL | 1 refills | Status: DC

## 2017-06-22 MED ORDER — LOSARTAN POTASSIUM 100 MG PO TABS: 100.0000 mg | ORAL_TABLET | Freq: Every morning | ORAL | 1 refills | Status: DC

## 2017-06-22 NOTE — Telephone Encounter (Signed)
Patient called stating the hydrochlorothiazide and levothyroxine was supposed to be sent in for 90 days and not 30,. Patient states she needs this corrected today being she is about to run out. Please advise

## 2017-06-22 NOTE — Telephone Encounter (Signed)
Prescriptions resent electronically to pharmacy.

## 2017-09-05 ENCOUNTER — Ambulatory Visit (INDEPENDENT_AMBULATORY_CARE_PROVIDER_SITE_OTHER): Payer: Medicare Other | Admitting: Nurse Practitioner

## 2017-09-05 ENCOUNTER — Telehealth: Payer: Self-pay | Admitting: *Deleted

## 2017-09-05 ENCOUNTER — Encounter: Payer: Self-pay | Admitting: Nurse Practitioner

## 2017-09-05 VITALS — BP 124/76 | Ht 62.0 in | Wt 209.0 lb

## 2017-09-05 DIAGNOSIS — Z0000 Encounter for general adult medical examination without abnormal findings: Secondary | ICD-10-CM

## 2017-09-05 MED ORDER — VENLAFAXINE HCL ER 37.5 MG PO CP24: 37.5000 mg | ORAL_CAPSULE | Freq: Every day | ORAL | 1 refills | Status: DC

## 2017-09-05 NOTE — Telephone Encounter (Signed)
Please review [place] questionnaire in yellow folder on carolyn's desk.

## 2017-09-05 NOTE — Progress Notes (Signed)
Subjective:    Patient ID: Kimberly [name], female    DOB: 16-Jan-1951, 67 y.o.   MRN: [id number]  HPI AWV- Annual Wellness Visit  The patient was seen for their annual wellness visit. The patient's past medical history, surgical history, and family history were reviewed. Pertinent vaccines were reviewed ( tetanus, pneumonia, shingles, flu) The patient's medication list was reviewed and updated.  The height and weight were entered. The patient's current BMI is: 38.23  Cognitive screening was completed. Outcome of Mini - Cog: pass  Falls within the past 6 months: none  Current tobacco usage: none (All patients who use tobacco were given written and verbal information on quitting)  Recent listing of emergency department/hospitalizations over the past year were reviewed.  current specialist the patient sees on a regular basis: none   Medicare annual wellness visit patient questionnaire was reviewed.  A written screening schedule for the patient for the next 5-10 years was given. Appropriate discussion of followup regarding next visit was discussed.  Pt wants to schedule her own mammogram.   Presents for her wellness exam. Has had a hysterectomy. No pelvic pain. Same sexual partner but not sexually active. Regular vision exams. Needs dental exam. Regular activity. Had a colonoscopy about 2 years ago. Some polyps. Has to repeat in 5 years.  Depression screen [facility] 2/9 09/05/2017 05/30/2017  Decreased Interest 3 1  Down, Depressed, Hopeless 3 0  PHQ - 2 Score 6 1  Altered sleeping 2 3  Tired, decreased energy 3 3  Change in appetite 3 1  Feeling bad or failure about yourself  2 2  Trouble concentrating 0 0  Moving slowly or fidgety/restless 2 1  Suicidal thoughts 0 0  PHQ-9 Score 18 11  Difficult doing work/chores - Somewhat difficult   Has been under increased stress the past 2 years. Would like to increase Effexor dose, but 150 mg caused flat affect.       Review of  Systems  Constitutional: Positive for appetite change and fatigue. Negative for activity change.  HENT: Negative for dental problem, ear pain, sinus pressure and sore throat.   Respiratory: Negative for cough, chest tightness, shortness of breath and wheezing.   Cardiovascular: Negative for chest pain.  Gastrointestinal: Negative for abdominal distention, abdominal pain, blood in stool, constipation, diarrhea, nausea and vomiting.  Genitourinary: Positive for enuresis, frequency and urgency. Negative for difficulty urinating, dysuria, genital sores, vaginal bleeding and vaginal discharge.       Mild incontinence. Wears a light pad.   Psychiatric/Behavioral: Positive for dysphoric mood and sleep disturbance. Negative for suicidal ideas. The patient is nervous/anxious.        Objective:   Physical Exam  Constitutional: She is oriented to person, place, and time. She appears well-developed. No distress.  HENT:  Right Ear: External ear normal.  Left Ear: External ear normal.  Mouth/Throat: Oropharynx is clear and moist.  Neck: Normal range of motion. Neck supple. No tracheal deviation present. No thyromegaly present.  Cardiovascular: Normal rate, regular rhythm and normal heart sounds. Exam reveals no gallop.  No murmur heard. Pulmonary/Chest: Effort normal and breath sounds normal. Right breast exhibits no inverted nipple, no mass, no skin change and no tenderness. Left breast exhibits no inverted nipple, no mass, no skin change and no tenderness. Breasts are symmetrical.  Axillae no adenopathy.   Abdominal: Soft. She exhibits no distension. There is no tenderness.  Genitourinary: Vagina normal. No vaginal discharge found.  Genitourinary Comments: External GU: no  rashes or lesions. Vagina: no discharge. Skin pale. Bimanual exam: no tenderness or obvious masses but exam limited due to abd girth.   Musculoskeletal: She exhibits no edema.  Lymphadenopathy:    She has no cervical adenopathy.    Neurological: She is alert and oriented to person, place, and time.  Skin: Skin is warm and dry. No rash noted.  Psychiatric: She has a normal mood and affect. Her behavior is normal.  Vitals reviewed.         Assessment & Plan:  Routine general medical examination at a health care facility  Meds ordered this encounter  Medications  . venlafaxine XR (EFFEXOR-XR) 37.5 MG 24 hr capsule    Sig: Take 1 capsule (37.5 mg total) by mouth daily with breakfast.    Dispense:  90 capsule    Refill:  1    Order Specific Question:   Supervising Provider    Answer:   Mikey Kirschner [[id number]]   Increase Effexor to 112.5 mg. Our goal is to improve anxiety and depression without causing flat affect. Encouraged activity, healthy diet and weight loss.  Return in about 1 year (around 09/05/2018) for physical. Routine follow up for chronic health issues.

## 2017-09-05 NOTE — Telephone Encounter (Signed)
Done

## 2017-10-10 ENCOUNTER — Other Ambulatory Visit: Payer: Self-pay | Admitting: Nurse Practitioner

## 2017-10-10 ENCOUNTER — Telehealth: Payer: Self-pay | Admitting: Nurse Practitioner

## 2017-10-10 DIAGNOSIS — R0683 Snoring: Secondary | ICD-10-CM

## 2017-10-10 DIAGNOSIS — R5383 Other fatigue: Secondary | ICD-10-CM

## 2017-10-10 NOTE — Telephone Encounter (Signed)
So sorry - I found pt's [place] Questionnaire in my stuff, there is no record of a sleep study being ordered although her ?'s are positive  The note on questionnaire says to see your note on physical, however I didn't see where the physical stated anything about need for a sleep study only that there's sleep disturbance  Please initiate referral for sleep study so I may process

## 2017-10-10 NOTE — Telephone Encounter (Signed)
We cover so many things and sometimes it is brought up after we have left the room. Thanks for the heads up. Will put in referral.

## 2017-10-16 DIAGNOSIS — E119 Type 2 diabetes mellitus without complications: Secondary | ICD-10-CM | POA: Diagnosis not present

## 2017-10-22 ENCOUNTER — Encounter: Payer: Self-pay | Admitting: Family Medicine

## 2017-10-29 NOTE — Telephone Encounter (Signed)
Order sent, see note in referral

## 2017-11-16 DIAGNOSIS — J0141 Acute recurrent pansinusitis: Secondary | ICD-10-CM | POA: Diagnosis not present

## 2017-11-23 DIAGNOSIS — J209 Acute bronchitis, unspecified: Secondary | ICD-10-CM | POA: Diagnosis not present

## 2017-11-24 ENCOUNTER — Other Ambulatory Visit: Payer: Self-pay | Admitting: Nurse Practitioner

## 2017-11-28 ENCOUNTER — Ambulatory Visit: Payer: Medicare Other | Admitting: Family Medicine

## 2017-12-05 ENCOUNTER — Other Ambulatory Visit: Payer: Self-pay | Admitting: Nurse Practitioner

## 2017-12-05 MED ORDER — VENLAFAXINE HCL ER 75 MG PO CP24
75.0000 mg | ORAL_CAPSULE | Freq: Every day | ORAL | 1 refills | Status: DC
Start: 2017-12-05 — End: 2017-12-26

## 2017-12-26 ENCOUNTER — Encounter: Payer: Self-pay | Admitting: Family Medicine

## 2017-12-26 ENCOUNTER — Ambulatory Visit (INDEPENDENT_AMBULATORY_CARE_PROVIDER_SITE_OTHER): Payer: Medicare Other | Admitting: Family Medicine

## 2017-12-26 VITALS — BP 140/82 | Ht 62.0 in | Wt 208.8 lb

## 2017-12-26 DIAGNOSIS — Z8639 Personal history of other endocrine, nutritional and metabolic disease: Secondary | ICD-10-CM

## 2017-12-26 DIAGNOSIS — E039 Hypothyroidism, unspecified: Secondary | ICD-10-CM

## 2017-12-26 DIAGNOSIS — E1165 Type 2 diabetes mellitus with hyperglycemia: Secondary | ICD-10-CM | POA: Diagnosis not present

## 2017-12-26 DIAGNOSIS — Z79899 Other long term (current) drug therapy: Secondary | ICD-10-CM | POA: Diagnosis not present

## 2017-12-26 DIAGNOSIS — E7800 Pure hypercholesterolemia, unspecified: Secondary | ICD-10-CM | POA: Diagnosis not present

## 2017-12-26 DIAGNOSIS — R1310 Dysphagia, unspecified: Secondary | ICD-10-CM | POA: Diagnosis not present

## 2017-12-26 DIAGNOSIS — R809 Proteinuria, unspecified: Secondary | ICD-10-CM | POA: Diagnosis not present

## 2017-12-26 DIAGNOSIS — R6889 Other general symptoms and signs: Secondary | ICD-10-CM | POA: Diagnosis not present

## 2017-12-26 DIAGNOSIS — I10 Essential (primary) hypertension: Secondary | ICD-10-CM

## 2017-12-26 DIAGNOSIS — R0989 Other specified symptoms and signs involving the circulatory and respiratory systems: Secondary | ICD-10-CM

## 2017-12-26 LAB — POCT GLYCOSYLATED HEMOGLOBIN (HGB A1C): Hemoglobin A1C: 6.5

## 2017-12-26 MED ORDER — METOPROLOL SUCCINATE ER 100 MG PO TB24: 100.0000 mg | ORAL_TABLET | Freq: Every day | ORAL | 1 refills | Status: DC

## 2017-12-26 MED ORDER — VENLAFAXINE HCL ER 37.5 MG PO CP24: ORAL_CAPSULE | ORAL | 1 refills | Status: DC

## 2017-12-26 MED ORDER — METFORMIN HCL 500 MG PO TABS: ORAL_TABLET | ORAL | 1 refills | Status: DC

## 2017-12-26 MED ORDER — DICLOFENAC SODIUM 75 MG PO TBEC: 75.0000 mg | DELAYED_RELEASE_TABLET | Freq: Two times a day (BID) | ORAL | 1 refills | Status: DC

## 2017-12-26 MED ORDER — LEVOTHYROXINE SODIUM 137 MCG PO TABS: ORAL_TABLET | ORAL | 1 refills | Status: DC

## 2017-12-26 MED ORDER — PANTOPRAZOLE SODIUM 40 MG PO TBEC: 40.0000 mg | DELAYED_RELEASE_TABLET | Freq: Every day | ORAL | 1 refills | Status: DC

## 2017-12-26 MED ORDER — HYDROCHLOROTHIAZIDE 25 MG PO TABS: 25.0000 mg | ORAL_TABLET | Freq: Every day | ORAL | 1 refills | Status: DC

## 2017-12-26 MED ORDER — LOSARTAN POTASSIUM 100 MG PO TABS: 100.0000 mg | ORAL_TABLET | Freq: Every morning | ORAL | 1 refills | Status: DC

## 2017-12-26 MED ORDER — VENLAFAXINE HCL ER 75 MG PO CP24: 75.0000 mg | ORAL_CAPSULE | Freq: Every day | ORAL | 1 refills | Status: DC

## 2017-12-26 NOTE — Progress Notes (Signed)
   Subjective:  Patient arrives office with numerous concerns.  Patient ID: Kimberly [name], female    DOB: 04/19/1951, 67 y.o.   MRN: [id number] Patient arrives with tremendous number of concerns  Diabetes  She presents for her follow-up diabetic visit. She has type 2 diabetes mellitus. There are no hypoglycemic associated symptoms. There are no diabetic associated symptoms. There are no hypoglycemic complications. She does not see a podiatrist.Eye exam is current.   Results for orders placed or performed in visit on 12/26/17  POCT HgB A1C  Result Value Ref Range   Hemoglobin A1C 6.5    Blood pressure medicine and blood pressure levels reviewed today with patient. Compliant with blood pressure medicine. States does not miss a dose. No obvious side effects. Blood pressure generally good when checked elsewhere. Watching salt intake.   Patient claims compliance with diabetes medication. No obvious side effects. Reports no substantial low sugar spells. Most numbers are generally in good range when checked fasting. Generally does not miss a dose of medication. Watching diabetic diet closely  Patient notes ongoing compliance with antidepressant medication. No obvious side effects. Reports does not miss a dose. Overall continues to help depression substantially. No thoughts of homicide or suicide. Would like to maintain medication.  Working towards the pool in [place]  The recent adjustment of the effexor has definitely helped    Pt has seen ophthal m has macular drusen   Patient notes difficulty swallowing.  His sensation of throat fullness.  She has had goiter in the past.  She has had thyroid surgery in the past.  She is concerned something is going on with her throat   Review of Systems No headache, no major weight loss or weight gain, no chest pain no back pain abdominal pain no change in bowel habits complete ROS otherwise negative     Objective:   Physical Exam  Alert and  oriented, vitals reviewed and stable, NAD ENT-TM's and ext canals WNL bilat via otoscopic exam Soft palate, tonsils and post pharynx WNL via oropharyngeal exam Neck-symmetric, no masses; thyroid nonpalpable and nontender Pulmonary-no tachypnea or accessory muscle use; Clear without wheezes via auscultation Card--no abnrml murmurs, rhythm reg and rate WNL Carotid pulses symmetric, without bruits Specifically neck exam reveals no enlargement or adenopathy or mass.      Assessment & Plan:  1.  Impression #1 hypertension good control pill discussed.  Medicine prescribed to maintain  2.  Throat fullness.  History of goiter.  Discussed.  We will press on and obtain ENT referral.  Rationale discussed.  3.  Hypothyroidism status uncertain discussed will evaluate blood work results further recommendations based on results  4.  Depression patient states overall improved on adjusted medications will maintain same rationale discussed  5.  Hyperlipidemia current status uncertain discussed to maintain same meds pending we will adjust if necessary  6.  Night sweats.  Discussed.  Patient wonders if we could check a hormone blood results.  Advised patient this really would not be helpful since it was undoubtedly be low.  Discussed.  Recommended no prescription meds for this rationale discussed  Greater than 50% of this 40 minute face to face visit was spent in counseling and discussion and coordination of care regarding the above diagnosis/diagnosies

## 2017-12-27 LAB — HEPATIC FUNCTION PANEL
ALT: 57 IU/L — ABNORMAL HIGH (ref 0–32)
AST: 30 IU/L (ref 0–40)
Albumin: 4.5 g/dL (ref 3.6–4.8)
Alkaline Phosphatase: 74 IU/L (ref 39–117)
Bilirubin Total: 0.5 mg/dL (ref 0.0–1.2)
Bilirubin, Direct: 0.14 mg/dL (ref 0.00–0.40)
Total Protein: 7.3 g/dL (ref 6.0–8.5)

## 2017-12-27 LAB — CBC WITH DIFFERENTIAL/PLATELET
BASOS ABS: 0.1 10*3/uL (ref 0.0–0.2)
BASOS: 1 %
EOS (ABSOLUTE): 0.3 10*3/uL (ref 0.0–0.4)
Eos: 4 %
Hematocrit: 35.2 % (ref 34.0–46.6)
Hemoglobin: 11.7 g/dL (ref 11.1–15.9)
Immature Grans (Abs): 0 10*3/uL (ref 0.0–0.1)
Immature Granulocytes: 0 %
Lymphocytes Absolute: 2.7 10*3/uL (ref 0.7–3.1)
Lymphs: 36 %
MCH: 33.1 pg — ABNORMAL HIGH (ref 26.6–33.0)
MCHC: 33.2 g/dL (ref 31.5–35.7)
MCV: 99 fL — AB (ref 79–97)
MONOS ABS: 0.6 10*3/uL (ref 0.1–0.9)
Monocytes: 8 %
NEUTROS ABS: 3.8 10*3/uL (ref 1.4–7.0)
Neutrophils: 51 %
PLATELETS: 369 10*3/uL (ref 150–379)
RBC: 3.54 x10E6/uL — ABNORMAL LOW (ref 3.77–5.28)
RDW: 13.5 % (ref 12.3–15.4)
WBC: 7.4 10*3/uL (ref 3.4–10.8)

## 2017-12-27 LAB — BASIC METABOLIC PANEL
BUN/Creatinine Ratio: 23 (ref 12–28)
BUN: 28 mg/dL — ABNORMAL HIGH (ref 8–27)
CO2: 24 mmol/L (ref 20–29)
Calcium: 9.7 mg/dL (ref 8.7–10.3)
Chloride: 101 mmol/L (ref 96–106)
Creatinine, Ser: 1.23 mg/dL — ABNORMAL HIGH (ref 0.57–1.00)
GFR, EST AFRICAN AMERICAN: 53 mL/min/{1.73_m2} — AB (ref >59)
GFR, EST NON AFRICAN AMERICAN: 46 mL/min/{1.73_m2} — AB (ref >59)
Glucose: 156 mg/dL — ABNORMAL HIGH (ref 65–99)
Potassium: 4.8 mmol/L (ref 3.5–5.2)
SODIUM: 140 mmol/L (ref 134–144)

## 2017-12-27 LAB — LIPID PANEL
CHOLESTEROL TOTAL: 168 mg/dL (ref 100–199)
Chol/HDL Ratio: 4.5 ratio — ABNORMAL HIGH (ref 0.0–4.4)
HDL: 37 mg/dL — ABNORMAL LOW (ref >39)
LDL Calculated: 82 mg/dL (ref 0–99)
Triglycerides: 243 mg/dL — ABNORMAL HIGH (ref 0–149)
VLDL Cholesterol Cal: 49 mg/dL — ABNORMAL HIGH (ref 5–40)

## 2017-12-27 LAB — MICROALBUMIN / CREATININE URINE RATIO
Creatinine, Urine: 131.7 mg/dL
MICROALBUM., U, RANDOM: 14.5 ug/mL
Microalb/Creat Ratio: 11 mg/g creat (ref 0.0–30.0)

## 2017-12-27 LAB — TSH: TSH: 0.256 u[IU]/mL — ABNORMAL LOW (ref 0.450–4.500)

## 2018-01-07 ENCOUNTER — Encounter: Payer: Self-pay | Admitting: Family Medicine

## 2018-01-07 ENCOUNTER — Encounter (INDEPENDENT_AMBULATORY_CARE_PROVIDER_SITE_OTHER): Payer: Self-pay

## 2018-01-09 ENCOUNTER — Other Ambulatory Visit: Payer: Self-pay | Admitting: *Deleted

## 2018-01-09 DIAGNOSIS — E039 Hypothyroidism, unspecified: Secondary | ICD-10-CM

## 2018-01-09 DIAGNOSIS — Z79899 Other long term (current) drug therapy: Secondary | ICD-10-CM

## 2018-01-09 MED ORDER — LEVOTHYROXINE SODIUM 125 MCG PO TABS: ORAL_TABLET | ORAL | 1 refills | Status: DC

## 2018-04-22 ENCOUNTER — Ambulatory Visit (INDEPENDENT_AMBULATORY_CARE_PROVIDER_SITE_OTHER): Payer: Medicare Other | Admitting: Otolaryngology

## 2018-04-22 DIAGNOSIS — E039 Hypothyroidism, unspecified: Secondary | ICD-10-CM | POA: Diagnosis not present

## 2018-04-22 DIAGNOSIS — K219 Gastro-esophageal reflux disease without esophagitis: Secondary | ICD-10-CM

## 2018-04-22 DIAGNOSIS — R1312 Dysphagia, oropharyngeal phase: Secondary | ICD-10-CM

## 2018-04-22 DIAGNOSIS — Z79899 Other long term (current) drug therapy: Secondary | ICD-10-CM | POA: Diagnosis not present

## 2018-04-23 ENCOUNTER — Other Ambulatory Visit (INDEPENDENT_AMBULATORY_CARE_PROVIDER_SITE_OTHER): Payer: Self-pay | Admitting: Otolaryngology

## 2018-04-23 DIAGNOSIS — R1310 Dysphagia, unspecified: Secondary | ICD-10-CM

## 2018-04-23 LAB — BASIC METABOLIC PANEL
BUN/Creatinine Ratio: 18 (ref 12–28)
BUN: 26 mg/dL (ref 8–27)
CALCIUM: 9.3 mg/dL (ref 8.7–10.3)
CHLORIDE: 99 mmol/L (ref 96–106)
CO2: 23 mmol/L (ref 20–29)
CREATININE: 1.45 mg/dL — AB (ref 0.57–1.00)
GFR calc Af Amer: 43 mL/min/{1.73_m2} — ABNORMAL LOW (ref >59)
GFR calc non Af Amer: 38 mL/min/{1.73_m2} — ABNORMAL LOW (ref >59)
GLUCOSE: 181 mg/dL — AB (ref 65–99)
Potassium: 4.4 mmol/L (ref 3.5–5.2)
Sodium: 139 mmol/L (ref 134–144)

## 2018-04-23 LAB — TSH: TSH: 0.528 u[IU]/mL (ref 0.450–4.500)

## 2018-04-30 ENCOUNTER — Ambulatory Visit (HOSPITAL_COMMUNITY)
Admission: RE | Admit: 2018-04-30 | Discharge: 2018-04-30 | Disposition: A | Discharge status: Home / Self Care | Payer: Medicare Other | Source: Ambulatory Visit | Attending: Otolaryngology | Admitting: Otolaryngology

## 2018-04-30 ENCOUNTER — Encounter: Payer: Self-pay | Admitting: Family Medicine

## 2018-04-30 ENCOUNTER — Other Ambulatory Visit: Payer: Self-pay | Admitting: Family Medicine

## 2018-04-30 ENCOUNTER — Ambulatory Visit (INDEPENDENT_AMBULATORY_CARE_PROVIDER_SITE_OTHER): Payer: Medicare Other | Admitting: Family Medicine

## 2018-04-30 VITALS — BP 188/92 | Ht 62.0 in | Wt 210.0 lb

## 2018-04-30 DIAGNOSIS — Z23 Encounter for immunization: Secondary | ICD-10-CM

## 2018-04-30 DIAGNOSIS — Z1231 Encounter for screening mammogram for malignant neoplasm of breast: Secondary | ICD-10-CM

## 2018-04-30 DIAGNOSIS — N183 Chronic kidney disease, stage 3 unspecified: Secondary | ICD-10-CM

## 2018-04-30 DIAGNOSIS — E039 Hypothyroidism, unspecified: Secondary | ICD-10-CM | POA: Diagnosis not present

## 2018-04-30 DIAGNOSIS — I10 Essential (primary) hypertension: Secondary | ICD-10-CM

## 2018-04-30 DIAGNOSIS — K219 Gastro-esophageal reflux disease without esophagitis: Secondary | ICD-10-CM | POA: Diagnosis not present

## 2018-04-30 DIAGNOSIS — R1310 Dysphagia, unspecified: Secondary | ICD-10-CM | POA: Diagnosis not present

## 2018-04-30 MED ORDER — TRAMADOL HCL 50 MG PO TABS: ORAL_TABLET | ORAL | 4 refills | Status: DC

## 2018-04-30 MED ORDER — GLIPIZIDE ER 5 MG PO TB24: ORAL_TABLET | ORAL | 5 refills | Status: DC

## 2018-04-30 NOTE — Progress Notes (Signed)
   Subjective:    Patient ID: Kimberly [name], female    DOB: 1951/02/03, 67 y.o.   MRN: [id number]  HPI  Patient is here today with complaints of  Joint pains especially her hands after changing her antiinflammatories   . She also states she has to leave her to do a swallow test,becasue her esophagus is swollen.  Patient claims compliance with diabetes medication. No obvious side effects. Reports no substantial low sugar spells.  Despite the fact patient's A1c was 6.5% 4 months ago, fasting sugars have significantly drifted upwards.. Generally does not miss a dose of medication. Watching diabetic diet closely    Adding the ranitidine has helped a lot  Now pending swallow test ,  With adtn of ranitidine now things ar e beter  Pt has noted   Blood pressure medicine and blood pressure levels reviewed today with patient. Compliant with blood pressure medicine. States does not miss a dose. No obvious side effects.  Despite compliance with blood pressure medication.  Patient notes that her blood pressures are up with systolics often 1 63-1 60 watching salt intake.   Takes one ta twice per day      Review of Systems .rs No headache, no major weight loss or weight gain, no chest pain no back pain abdominal pain no change in bowel habits complete ROS otherwise negative     Objective:   Physical Exam  Alert and oriented, vitals reviewed and stable, NAD ENT-TM's and ext canals WNL bilat via otoscopic exam Soft palate, tonsils and post pharynx WNL via oropharyngeal exam Neck-symmetric, no masses; thyroid nonpalpable and nontender Pulmonary-no tachypnea or accessory muscle use; Clear without wheezes via auscultation Card--no abnrml murmurs, rhythm reg and rate WNL Carotid pulses symmetric, without bruits       Assessment & Plan:  1 impression renal function.  Creatinine has risen now GFR down into the mid 30s.  Patient understandably anxious.  Complicated by hypertension.   He has diabetes.  With A1c dropping despite the fact we stopped the Voltaren, will see nephrology consult  2.  Hypertension suboptimal control await input from the [facility] nephrologist  3.  Type 2 diabetes.  Suboptimal control.  Challenging with worsening GFR.  Also chronic diarrhea.  Will not increase metformin therefore.  Add Glucotrol XL 5 mg every morning  4.  Chronic arthritis pain.  Nonsteroidals not a good idea with renal function drop, will add Ultram 51 nightly as needed for pain  Flu shot today/diet exercise discussed/medication adjusted as noted/follow-up in 3 months  Greater than 50% of this 25 minute face to face visit was spent in counseling and discussion and coordination of care regarding the above diagnosis/diagnosies Just in case

## 2018-05-02 ENCOUNTER — Ambulatory Visit (HOSPITAL_COMMUNITY)
Admission: RE | Admit: 2018-05-02 | Discharge: 2018-05-02 | Disposition: A | Discharge status: Home / Self Care | Payer: Medicare Other | Source: Ambulatory Visit | Attending: Family Medicine | Admitting: Family Medicine

## 2018-05-02 DIAGNOSIS — Z1231 Encounter for screening mammogram for malignant neoplasm of breast: Secondary | ICD-10-CM

## 2018-05-03 ENCOUNTER — Other Ambulatory Visit: Payer: Self-pay | Admitting: *Deleted

## 2018-05-03 ENCOUNTER — Telehealth: Payer: Self-pay | Admitting: *Deleted

## 2018-05-03 MED ORDER — GLIPIZIDE ER 5 MG PO TB24: ORAL_TABLET | ORAL | 1 refills | Status: DC

## 2018-05-03 NOTE — Telephone Encounter (Signed)
Pt states [facility] will only fill a 7 day supply of med unless dr does PA on med she states she was prescribed this med for breakthrough pain since she had to stop all antiinflammatories due to her kidney fuction. Has pain all over. Pt advised nurses will work on PA and call her when we get decision.

## 2018-05-03 NOTE — Telephone Encounter (Signed)
Called [facility] because we did not receive form they gave me number to call insurance [phone]. Called and was asked if pt was opioid naive and I said no the lady then states it does not need pa unless pt is opioid naive and to call back to pharm and give them billing code [phone]. I called back to [facility] and they told me they don't know what a billing code is and I need to call back to insurance.

## 2018-05-03 NOTE — Telephone Encounter (Signed)
Called pa department back and they gave me a key to do through cover my meds  KEY - [id number]. Submitted pa through cover my meds. Await decision

## 2018-05-06 NOTE — Telephone Encounter (Signed)
Awaiting response

## 2018-05-06 NOTE — Telephone Encounter (Addendum)
Tramadol approved via cover my meds. Pharmacy notified and will send prescription to patient as prescribed.

## 2018-05-07 ENCOUNTER — Encounter: Payer: Self-pay | Admitting: Family Medicine

## 2018-06-03 ENCOUNTER — Ambulatory Visit (INDEPENDENT_AMBULATORY_CARE_PROVIDER_SITE_OTHER): Payer: Medicare Other | Admitting: Otolaryngology

## 2018-06-03 DIAGNOSIS — R1312 Dysphagia, oropharyngeal phase: Secondary | ICD-10-CM | POA: Diagnosis not present

## 2018-06-03 DIAGNOSIS — R070 Pain in throat: Secondary | ICD-10-CM

## 2018-06-03 DIAGNOSIS — K219 Gastro-esophageal reflux disease without esophagitis: Secondary | ICD-10-CM | POA: Diagnosis not present

## 2018-06-10 DIAGNOSIS — N183 Chronic kidney disease, stage 3 (moderate): Secondary | ICD-10-CM | POA: Diagnosis not present

## 2018-06-10 DIAGNOSIS — R809 Proteinuria, unspecified: Secondary | ICD-10-CM | POA: Diagnosis not present

## 2018-06-11 ENCOUNTER — Other Ambulatory Visit: Payer: Self-pay | Admitting: Family Medicine

## 2018-06-19 ENCOUNTER — Other Ambulatory Visit (HOSPITAL_COMMUNITY): Payer: Self-pay | Admitting: Medical

## 2018-06-19 DIAGNOSIS — N183 Chronic kidney disease, stage 3 unspecified: Secondary | ICD-10-CM

## 2018-06-28 ENCOUNTER — Ambulatory Visit: Payer: Medicare Other | Admitting: Family Medicine

## 2018-07-02 ENCOUNTER — Ambulatory Visit (HOSPITAL_COMMUNITY)
Admission: RE | Admit: 2018-07-02 | Discharge: 2018-07-02 | Disposition: A | Discharge status: Home / Self Care | Payer: Medicare Other | Source: Ambulatory Visit | Attending: Medical | Admitting: Medical

## 2018-07-02 ENCOUNTER — Ambulatory Visit (HOSPITAL_COMMUNITY): Payer: Medicare Other

## 2018-07-02 DIAGNOSIS — D519 Vitamin B12 deficiency anemia, unspecified: Secondary | ICD-10-CM | POA: Diagnosis not present

## 2018-07-02 DIAGNOSIS — R809 Proteinuria, unspecified: Secondary | ICD-10-CM | POA: Diagnosis not present

## 2018-07-02 DIAGNOSIS — Z1159 Encounter for screening for other viral diseases: Secondary | ICD-10-CM | POA: Diagnosis not present

## 2018-07-02 DIAGNOSIS — E559 Vitamin D deficiency, unspecified: Secondary | ICD-10-CM | POA: Diagnosis not present

## 2018-07-02 DIAGNOSIS — N183 Chronic kidney disease, stage 3 unspecified: Secondary | ICD-10-CM

## 2018-07-02 DIAGNOSIS — N189 Chronic kidney disease, unspecified: Secondary | ICD-10-CM | POA: Diagnosis not present

## 2018-07-02 DIAGNOSIS — I10 Essential (primary) hypertension: Secondary | ICD-10-CM | POA: Diagnosis not present

## 2018-07-02 DIAGNOSIS — D509 Iron deficiency anemia, unspecified: Secondary | ICD-10-CM | POA: Diagnosis not present

## 2018-07-02 DIAGNOSIS — Z79899 Other long term (current) drug therapy: Secondary | ICD-10-CM | POA: Diagnosis not present

## 2018-07-08 DIAGNOSIS — E559 Vitamin D deficiency, unspecified: Secondary | ICD-10-CM | POA: Diagnosis not present

## 2018-07-08 DIAGNOSIS — N183 Chronic kidney disease, stage 3 (moderate): Secondary | ICD-10-CM | POA: Diagnosis not present

## 2018-07-08 DIAGNOSIS — I10 Essential (primary) hypertension: Secondary | ICD-10-CM | POA: Diagnosis not present

## 2018-07-08 DIAGNOSIS — E669 Obesity, unspecified: Secondary | ICD-10-CM | POA: Diagnosis not present

## 2018-07-18 DIAGNOSIS — K7581 Nonalcoholic steatohepatitis (NASH): Secondary | ICD-10-CM | POA: Diagnosis not present

## 2018-07-18 DIAGNOSIS — K5904 Chronic idiopathic constipation: Secondary | ICD-10-CM | POA: Diagnosis not present

## 2018-07-18 DIAGNOSIS — K5730 Diverticulosis of large intestine without perforation or abscess without bleeding: Secondary | ICD-10-CM | POA: Diagnosis not present

## 2018-07-18 DIAGNOSIS — K219 Gastro-esophageal reflux disease without esophagitis: Secondary | ICD-10-CM | POA: Diagnosis not present

## 2018-07-22 ENCOUNTER — Encounter: Payer: Self-pay | Admitting: Family Medicine

## 2018-07-22 ENCOUNTER — Ambulatory Visit (INDEPENDENT_AMBULATORY_CARE_PROVIDER_SITE_OTHER): Payer: Medicare Other | Admitting: Family Medicine

## 2018-07-22 VITALS — BP 132/84 | Ht 62.0 in | Wt 206.8 lb

## 2018-07-22 DIAGNOSIS — E039 Hypothyroidism, unspecified: Secondary | ICD-10-CM | POA: Diagnosis not present

## 2018-07-22 DIAGNOSIS — N183 Chronic kidney disease, stage 3 unspecified: Secondary | ICD-10-CM

## 2018-07-22 DIAGNOSIS — E119 Type 2 diabetes mellitus without complications: Secondary | ICD-10-CM

## 2018-07-22 DIAGNOSIS — I10 Essential (primary) hypertension: Secondary | ICD-10-CM | POA: Diagnosis not present

## 2018-07-22 LAB — POCT GLYCOSYLATED HEMOGLOBIN (HGB A1C): HEMOGLOBIN A1C: 6.9 % — AB (ref 4.0–5.6)

## 2018-07-22 NOTE — Progress Notes (Signed)
   Subjective:    Patient ID: Kimberly [name], female    DOB: Nov 29, 1950, 67 y.o.   MRN: [id number]  Diabetes  She presents for her follow-up diabetic visit. She has type 2 diabetes mellitus. She is compliant with treatment all of the time. Home blood sugar record trend: does not check blood sugar. She does not see a podiatrist.Eye exam current: summer 2019.   Results for orders placed or performed in visit on 07/22/18  POCT glycosylated hemoglobin (Hb A1C)  Result Value Ref Range   Hemoglobin A1C 6.9 (A) 4.0 - 5.6 %   HbA1c POC (<> result, manual entry)     HbA1c, POC (prediabetic range)     HbA1c, POC (controlled diabetic range)     Would like to discuss issues with gas. Tried Linzess 290mg .   saw dr Collene Mares the gi doc last wk, who gave her linzess, and told to take 16 days, took two days, and feels much beter   Patient notes ongoing compliance with antidepressant medication. No obvious side effects. Reports does not miss a dose. Overall continues to help depression substantially. No thoughts of homicide or suicide. Would like to maintain medication.  bp s have al been good    Thyroid compiant with    Blood pressure medicine and blood pressure levels reviewed today with patient. Compliant with blood pressure medicine. States does not miss a dose. No obvious side effects. Blood pressure generally good when checked elsewhere. Watching salt intake.  Followed for renal insufficiency, improved, the nephr cont to follow   Saw dr Elgie Congo, has ongoing reflux and swelling changs  Patient claims compliance with diabetes medication. No obvious side effects. Reports no substantial low sugar spells. Most numbers are generally in good range when checked fasting. Generally does not miss a dose of medication. Watching diabetic diet closely   Review of Systems No headache, no major weight loss or weight gain, no chest pain no back pain abdominal pain no change in bowel habits complete ROS  otherwise negative     Objective:   Physical Exam  Alert and oriented, vitals reviewed and stable, NAD ENT-TM's and ext canals WNL bilat via otoscopic exam Soft palate, tonsils and post pharynx WNL via oropharyngeal exam Neck-symmetric, no masses; thyroid nonpalpable and nontender Pulmonary-no tachypnea or accessory muscle use; Clear without wheezes via auscultation Card--no abnrml murmurs, rhythm reg and rate WNL Carotid pulses symmetric, without bruits      Assessment & Plan:  Impression type 2 diabetes.  Control good discussed to maintain same diet exercise discussed  2.  Depression clinically stable to maintain same approach discussed compliance discussed  3.  Hypertension good control though not perfect.  Discussed will maintain the same  4.  Hypothyroidism, stable on last blood work patient compliant with meds

## 2018-07-23 ENCOUNTER — Ambulatory Visit: Payer: Medicare Other | Admitting: Family Medicine

## 2018-08-01 ENCOUNTER — Ambulatory Visit: Payer: Medicare Other | Admitting: Family Medicine

## 2018-08-16 ENCOUNTER — Other Ambulatory Visit: Payer: Self-pay | Admitting: *Deleted

## 2018-08-16 MED ORDER — LEVOTHYROXINE SODIUM 125 MCG PO TABS: ORAL_TABLET | ORAL | 1 refills | Status: DC

## 2018-08-19 ENCOUNTER — Ambulatory Visit (INDEPENDENT_AMBULATORY_CARE_PROVIDER_SITE_OTHER): Payer: Medicare Other | Admitting: Otolaryngology

## 2018-08-19 DIAGNOSIS — K219 Gastro-esophageal reflux disease without esophagitis: Secondary | ICD-10-CM | POA: Diagnosis not present

## 2018-08-19 DIAGNOSIS — R1312 Dysphagia, oropharyngeal phase: Secondary | ICD-10-CM

## 2018-09-04 DIAGNOSIS — B9689 Other specified bacterial agents as the cause of diseases classified elsewhere: Secondary | ICD-10-CM | POA: Diagnosis not present

## 2018-09-04 DIAGNOSIS — D170 Benign lipomatous neoplasm of skin and subcutaneous tissue of head, face and neck: Secondary | ICD-10-CM | POA: Diagnosis not present

## 2018-09-04 DIAGNOSIS — L02821 Furuncle of head [any part, except face]: Secondary | ICD-10-CM | POA: Diagnosis not present

## 2018-09-04 DIAGNOSIS — D225 Melanocytic nevi of trunk: Secondary | ICD-10-CM | POA: Diagnosis not present

## 2018-09-04 DIAGNOSIS — Z1283 Encounter for screening for malignant neoplasm of skin: Secondary | ICD-10-CM | POA: Diagnosis not present

## 2018-09-18 ENCOUNTER — Other Ambulatory Visit: Payer: Self-pay | Admitting: *Deleted

## 2018-09-18 MED ORDER — VENLAFAXINE HCL ER 37.5 MG PO CP24: ORAL_CAPSULE | ORAL | 1 refills | Status: DC

## 2018-09-18 MED ORDER — GLIPIZIDE ER 5 MG PO TB24: ORAL_TABLET | ORAL | 0 refills | Status: DC

## 2018-09-18 MED ORDER — VENLAFAXINE HCL ER 75 MG PO CP24: 75.0000 mg | ORAL_CAPSULE | Freq: Every day | ORAL | 1 refills | Status: DC

## 2018-09-18 MED ORDER — METFORMIN HCL 500 MG PO TABS: ORAL_TABLET | ORAL | 0 refills | Status: DC

## 2018-09-18 MED ORDER — LOSARTAN POTASSIUM 100 MG PO TABS: 100.0000 mg | ORAL_TABLET | Freq: Every morning | ORAL | 0 refills | Status: DC

## 2018-09-18 MED ORDER — HYDROCHLOROTHIAZIDE 25 MG PO TABS: 25.0000 mg | ORAL_TABLET | Freq: Every day | ORAL | 0 refills | Status: DC

## 2018-09-18 MED ORDER — PANTOPRAZOLE SODIUM 40 MG PO TBEC: 40.0000 mg | DELAYED_RELEASE_TABLET | Freq: Every day | ORAL | 0 refills | Status: DC

## 2018-11-12 ENCOUNTER — Ambulatory Visit (INDEPENDENT_AMBULATORY_CARE_PROVIDER_SITE_OTHER): Payer: Medicare Other | Admitting: Family Medicine

## 2018-11-12 ENCOUNTER — Other Ambulatory Visit: Payer: Self-pay

## 2018-11-12 ENCOUNTER — Encounter: Payer: Self-pay | Admitting: Family Medicine

## 2018-11-12 VITALS — BP 132/88 | Wt 208.0 lb

## 2018-11-12 DIAGNOSIS — M2550 Pain in unspecified joint: Secondary | ICD-10-CM | POA: Diagnosis not present

## 2018-11-12 NOTE — Progress Notes (Signed)
Subjective:    Patient ID: Kimberly [name], female    DOB: 03-30-1951, 68 y.o.   MRN: [id number]  HPI Pt here today for discussion of joint pains. Pt states the pains have been going on about 1.5 to 2 months. Ankles, top of feet, wrist, around knee joints. Knees have been replaced. Pt states that this did not begin until she was taken off Ibuprofen. Has been off Ibuprofen about 3 months. Pain is getting continually worse. Tylenol Arthritis once in a while and Ibuprofen at bedtime (per kidney doctor) has been tried.  Pt states she is having trouble sleeping. Swelling does come and go. Pt does have arthritis.   Kidney doctor took her off diclofenac a few months ago - had been on for 6-7 years previously since knee replacements. Bilateral ankle and feet pain x 6 weeks. Intermittent swelling noted. Nighttime gets cramps in arch of bilateral feet. Pain is intermittent but becoming more constant, described as a sharp pain at times. Worse with movement. No pain with resting or sitting. Taking tylenol arthritis - which is helpful for 8 hours, only takes when it's really bad. Very rarely takes ibuprofen at nighttime if pain is really bad ( 400-600 mg). Not taking ultram - states groggy the next day with any meds like that  Has f/u with kidney doctor and repeat labs next week   Wakes up at night with pain, night sweats since this pain has gotten worse.   Reports swollen area to left side of her neck x 1 year, getting worse and painful now. States was evaluated by Dr. Richardson Landry previously and told it was fatty tissue.   Review of Systems  Constitutional: Negative for chills, fever and unexpected weight change.  Respiratory: Negative for shortness of breath.   Cardiovascular: Negative for chest pain.  Musculoskeletal: Positive for arthralgias and joint swelling. Negative for neck pain and neck stiffness.  Neurological: Negative for numbness.       Objective:   Physical Exam Vitals signs and nursing  note reviewed.  Constitutional:      General: She is not in acute distress.    Appearance: Normal appearance. She is not toxic-appearing.  HENT:     Head: Normocephalic and atraumatic.  Neck:     Musculoskeletal: Neck supple. No neck rigidity.     Comments: Area of swelling to left-side base of neck, likely fat pad Cardiovascular:     Rate and Rhythm: Normal rate and regular rhythm.     Heart sounds: Normal heart sounds.  Pulmonary:     Effort: Pulmonary effort is normal. No respiratory distress.     Breath sounds: Normal breath sounds.  Musculoskeletal:     Comments: Right ankle with pain to anterior aspect of ankle - tender to palpation. Distal pulses intact. No swelling noted.  Left ankle with pain to lateral malleolus, tender to palpation, no swelling noted, distal pulses intact.   No obvious deformity. Skin intact, no erythema or ecchymosis noted.   Lymphadenopathy:     Cervical: No cervical adenopathy.  Skin:    General: Skin is warm and dry.  Neurological:     Mental Status: She is alert and oriented to person, place, and time.  Psychiatric:        Behavior: Behavior normal.           Assessment & Plan:  Arthralgia of multiple joints - Plan: Sedimentation rate, Uric acid, CBC with Differential/Platelet, Rheumatoid factor  Discussed with patient that her arthritis  is likely to get worse since being off of the diclofenac, but this was necessary to prevent worsening renal function and future of dialysis. She may increase the frequency of tylenol, pt states she is only taking 650 mg once daily, instructed that she can increase this to bid. Will obtain some lab work and f/u based on results.   Discussed with patient area of swelling to left side of neck is likely fat pad. Offered referral to general surgery for consult on removal if this is bothersome to her, but pt declines at this time.   Dr. Mickie Hillier was consulted on this case and is in agreement with the above  treatment plan.

## 2018-11-12 NOTE — Patient Instructions (Signed)
Please have your kidney doctor send your lab results to [place]

## 2018-11-18 ENCOUNTER — Ambulatory Visit (INDEPENDENT_AMBULATORY_CARE_PROVIDER_SITE_OTHER): Payer: Medicare Other | Admitting: Otolaryngology

## 2018-11-18 DIAGNOSIS — K219 Gastro-esophageal reflux disease without esophagitis: Secondary | ICD-10-CM | POA: Diagnosis not present

## 2018-11-18 DIAGNOSIS — N183 Chronic kidney disease, stage 3 (moderate): Secondary | ICD-10-CM | POA: Diagnosis not present

## 2018-11-18 DIAGNOSIS — R1312 Dysphagia, oropharyngeal phase: Secondary | ICD-10-CM | POA: Diagnosis not present

## 2018-11-18 DIAGNOSIS — M2550 Pain in unspecified joint: Secondary | ICD-10-CM | POA: Diagnosis not present

## 2018-11-18 DIAGNOSIS — E559 Vitamin D deficiency, unspecified: Secondary | ICD-10-CM | POA: Diagnosis not present

## 2018-11-18 DIAGNOSIS — D509 Iron deficiency anemia, unspecified: Secondary | ICD-10-CM | POA: Diagnosis not present

## 2018-11-18 DIAGNOSIS — I10 Essential (primary) hypertension: Secondary | ICD-10-CM | POA: Diagnosis not present

## 2018-11-18 DIAGNOSIS — R809 Proteinuria, unspecified: Secondary | ICD-10-CM | POA: Diagnosis not present

## 2018-11-18 DIAGNOSIS — Z79899 Other long term (current) drug therapy: Secondary | ICD-10-CM | POA: Diagnosis not present

## 2018-11-19 LAB — CBC WITH DIFFERENTIAL/PLATELET
BASOS: 1 %
Basophils Absolute: 0.1 10*3/uL (ref 0.0–0.2)
EOS (ABSOLUTE): 0.2 10*3/uL (ref 0.0–0.4)
Eos: 3 %
Hematocrit: 34.5 % (ref 34.0–46.6)
Hemoglobin: 12 g/dL (ref 11.1–15.9)
IMMATURE GRANS (ABS): 0 10*3/uL (ref 0.0–0.1)
IMMATURE GRANULOCYTES: 0 %
LYMPHS: 32 %
Lymphocytes Absolute: 2.5 10*3/uL (ref 0.7–3.1)
MCH: 33 pg (ref 26.6–33.0)
MCHC: 34.8 g/dL (ref 31.5–35.7)
MCV: 95 fL (ref 79–97)
MONOCYTES: 8 %
Monocytes Absolute: 0.6 10*3/uL (ref 0.1–0.9)
NEUTROS PCT: 56 %
Neutrophils Absolute: 4.4 10*3/uL (ref 1.4–7.0)
PLATELETS: 334 10*3/uL (ref 150–450)
RBC: 3.64 x10E6/uL — ABNORMAL LOW (ref 3.77–5.28)
RDW: 12.1 % (ref 11.7–15.4)
WBC: 7.8 10*3/uL (ref 3.4–10.8)

## 2018-11-19 LAB — RHEUMATOID FACTOR

## 2018-11-19 LAB — SEDIMENTATION RATE: SED RATE: 23 mm/h (ref 0–40)

## 2018-11-19 LAB — URIC ACID: Uric Acid: 5.9 mg/dL (ref 2.5–7.1)

## 2018-12-27 ENCOUNTER — Other Ambulatory Visit: Payer: Self-pay | Admitting: Family Medicine

## 2018-12-30 ENCOUNTER — Telehealth: Payer: Self-pay | Admitting: *Deleted

## 2018-12-30 ENCOUNTER — Encounter: Payer: Self-pay | Admitting: *Deleted

## 2018-12-30 ENCOUNTER — Other Ambulatory Visit: Payer: Self-pay | Admitting: *Deleted

## 2018-12-30 NOTE — Telephone Encounter (Signed)
[facility]ed [facility] and left her a message on her secure voicemail to d/c protonix.

## 2018-12-30 NOTE — Telephone Encounter (Signed)
yes

## 2018-12-30 NOTE — Telephone Encounter (Signed)
Hope from [facility] mail calling to clarify pt's med. They received protonix on 12/27/18 but filled omeprazole from local pharm on 4/3. Omeprazole is not on pt's med list. I called pt to clarify she states she takes pepcid otc 2 qhs and omeprazole in the morning prescribed by christy strader. States protonix was causing her to swell. And what she is taking now is working well. Is it ok to call [facility] and cancel protonix? [phone].

## 2019-01-13 ENCOUNTER — Ambulatory Visit (INDEPENDENT_AMBULATORY_CARE_PROVIDER_SITE_OTHER): Payer: Medicare Other | Admitting: Otolaryngology

## 2019-01-21 ENCOUNTER — Ambulatory Visit (INDEPENDENT_AMBULATORY_CARE_PROVIDER_SITE_OTHER): Payer: Medicare Other | Admitting: Family Medicine

## 2019-01-21 ENCOUNTER — Other Ambulatory Visit: Payer: Self-pay

## 2019-01-21 DIAGNOSIS — E039 Hypothyroidism, unspecified: Secondary | ICD-10-CM

## 2019-01-21 DIAGNOSIS — I10 Essential (primary) hypertension: Secondary | ICD-10-CM

## 2019-01-21 DIAGNOSIS — F320 Major depressive disorder, single episode, mild: Secondary | ICD-10-CM | POA: Diagnosis not present

## 2019-01-21 DIAGNOSIS — E1121 Type 2 diabetes mellitus with diabetic nephropathy: Secondary | ICD-10-CM

## 2019-01-21 MED ORDER — METOPROLOL SUCCINATE ER 100 MG PO TB24: ORAL_TABLET | ORAL | 0 refills | Status: DC

## 2019-01-21 MED ORDER — METFORMIN HCL 500 MG PO TABS: ORAL_TABLET | ORAL | 0 refills | Status: DC

## 2019-01-21 MED ORDER — LOSARTAN POTASSIUM 100 MG PO TABS: 100.0000 mg | ORAL_TABLET | Freq: Every morning | ORAL | 0 refills | Status: DC

## 2019-01-21 MED ORDER — VENLAFAXINE HCL ER 75 MG PO CP24: 75.0000 mg | ORAL_CAPSULE | Freq: Every day | ORAL | 0 refills | Status: DC

## 2019-01-21 MED ORDER — HYDROCHLOROTHIAZIDE 25 MG PO TABS: 25.0000 mg | ORAL_TABLET | Freq: Every day | ORAL | 0 refills | Status: DC

## 2019-01-21 MED ORDER — GLIPIZIDE ER 5 MG PO TB24: ORAL_TABLET | ORAL | 0 refills | Status: DC

## 2019-01-21 MED ORDER — LEVOTHYROXINE SODIUM 125 MCG PO TABS: ORAL_TABLET | ORAL | 0 refills | Status: DC

## 2019-01-21 NOTE — Progress Notes (Signed)
   Subjective:    Patient ID: Kimberly [name], female    DOB: 1951-01-02, 68 y.o.   MRN: [id number] audio, patient presents with numerous concerns Diabetes  She presents for her follow-up diabetic visit. She has type 2 diabetes mellitus. Risk factors for coronary artery disease include dyslipidemia, hypertension, post-menopausal and diabetes mellitus. Current diabetic treatment includes oral agent (dual therapy). She is compliant with treatment all of the time. Her weight is stable. She is following a diabetic diet.   Blood pressure medicine and blood pressure levels reviewed today with patient. Compliant with blood pressure medicine. States does not miss a dose. No obvious side effects. Blood pressure generally good when checked elsewhere. Watching salt intake.  Patient claims compliance with diabetes medication. No obvious side effects. Reports no substantial low sugar spells. Most numbers are generally in good range when checked fasting. Generally does not miss a dose of medication. Watching diabetic diet closely   Has not been checking sugars regularly but not been eating a lot of sugars  thyroid.  Compliant with medication.  Blood work showed good control 9 months ago.   Chronic tendency towards depression is remarkably stable on the patient's words.  Compliant with medicines.  No obvious side effects  Compliant with her thyroid medicine Review of Systems No headache, no major weight loss or weight gain, no chest pain no back pain abdominal pain no change in bowel habits complete ROS otherwise negative     Objective:   Physical Exam  .  Virtual visit      Assessment & Plan:  Impression 1 type 2 diabetes good control discussed maintain same meds  2.  Hypertension.  Good control compliance discussed salt intake discussed  3.  Depression.  Clinically stable.  Substantial loss in the family with multiple deaths lately.  Handling reasonably well  4.  Hypothyroidism compliant  with meds compliance discussed  Blood work not at this time.  Medications refilled diet exercise discussed follow-up in 6 months  Greater than 50% of this 25 minute face to face visit was spent in counseling and discussion and coordination of care regarding the above diagnosis/diagnosies

## 2019-01-21 NOTE — Addendum Note (Signed)
Addended by: Dairl Ponder on: 01/21/2019 01:22 PM   Modules accepted: Orders

## 2019-02-04 IMAGING — US US RENAL
2 series · 14 of 25 positions shown · non-contrast
Comparison: None.

CLINICAL DATA: Chronic renal disease.

EXAM:
RENAL / URINARY TRACT ULTRASOUND COMPLETE

[Series 1: us renal · 0.26mm/px · 13 of 41 slices shown (1 of 2)]
[im 1/41]
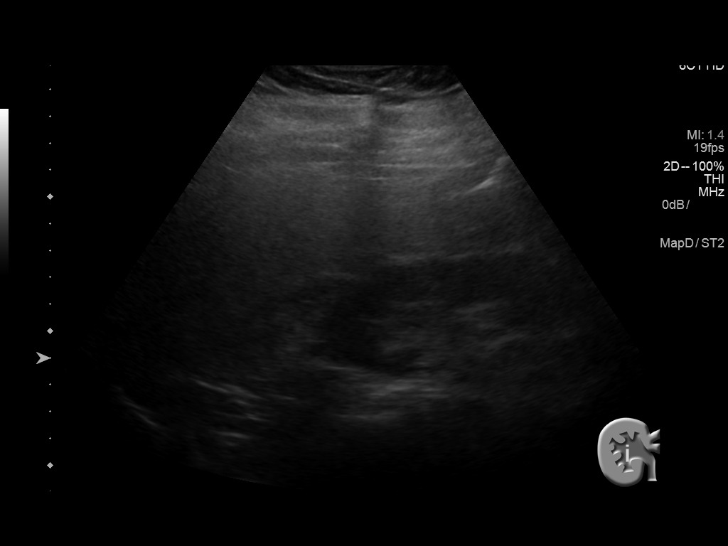
[im 4/41]
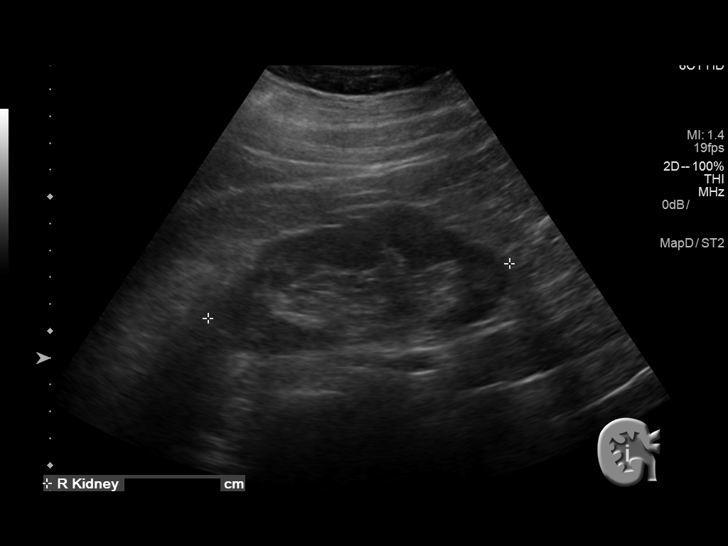
[im 8/41]
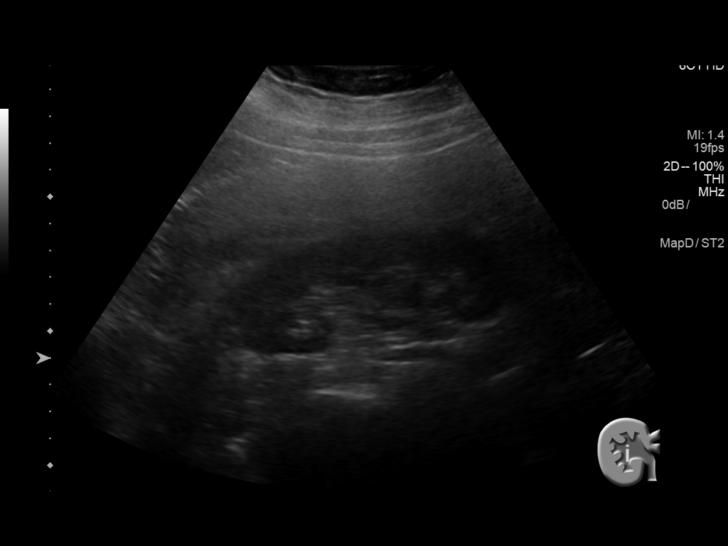
[im 11/41]
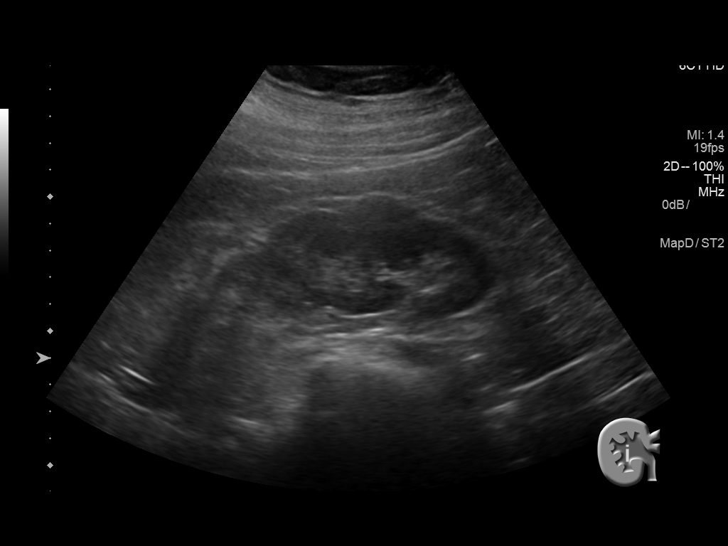
[im 15/41]
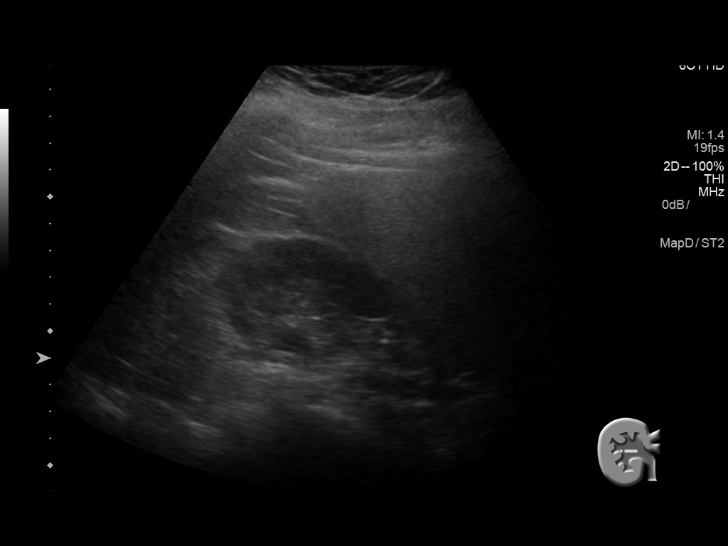
[im 17/41]
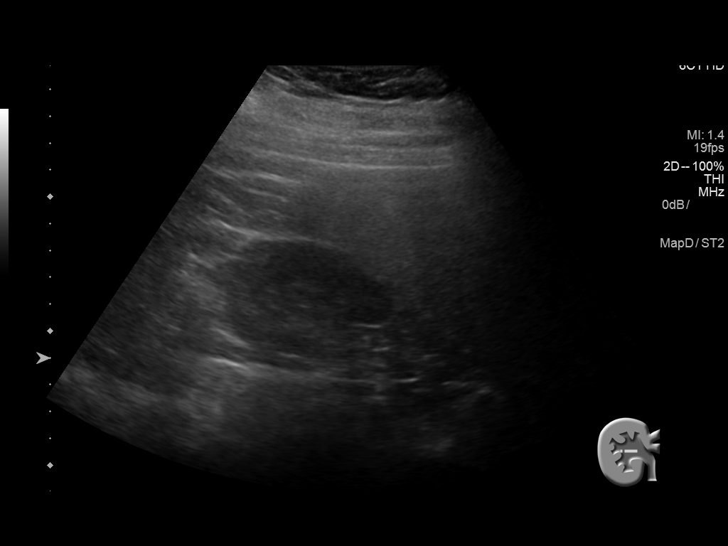
[im 21/41]
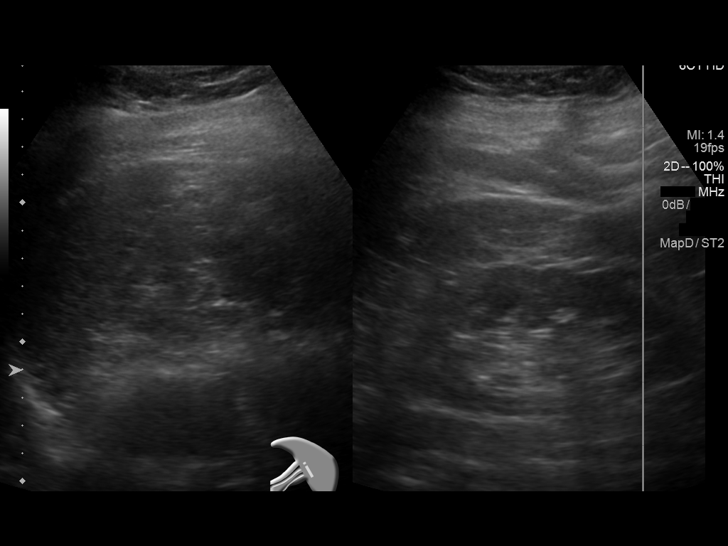
[im 24/41]
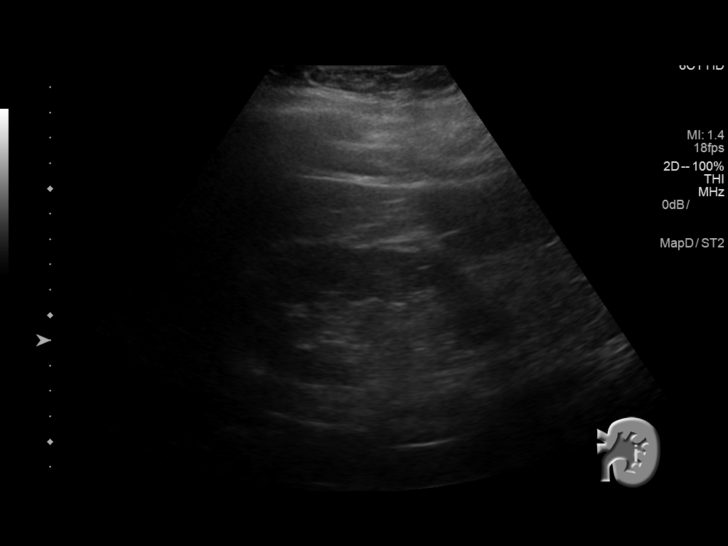
[im 28/41]
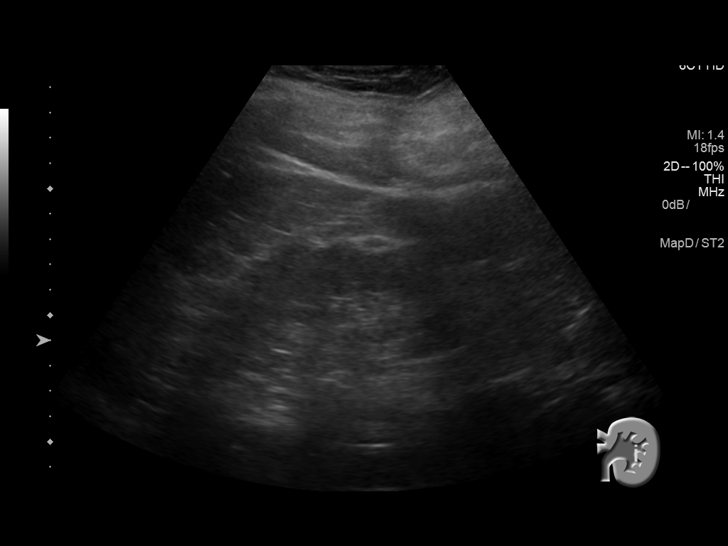
[im 30/41]
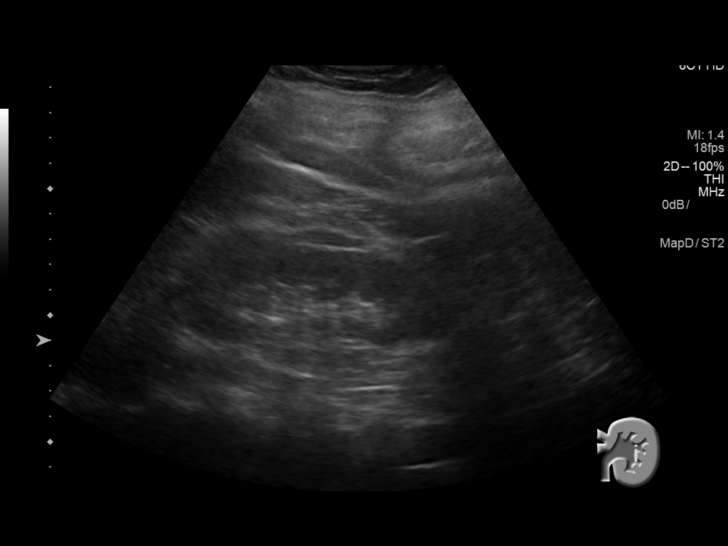
[im 33/41]
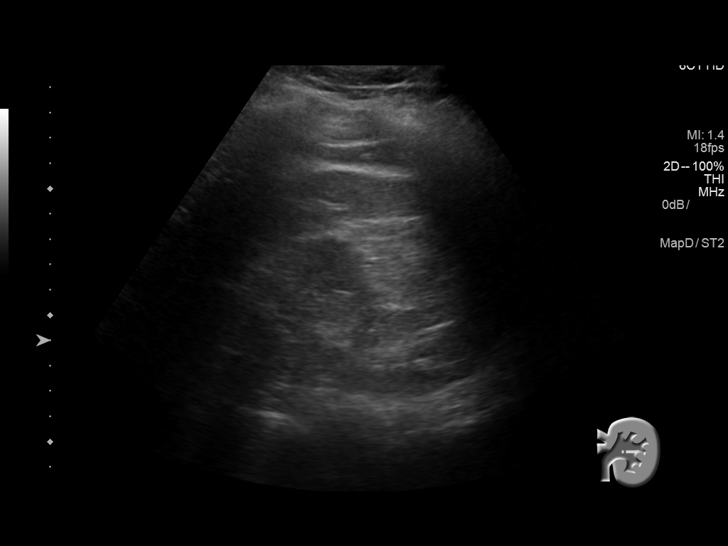
[im 37/41]
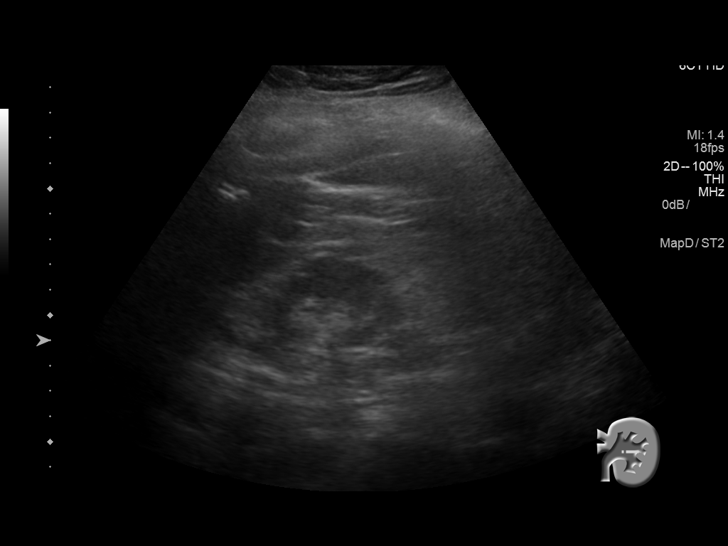
[im 41/41]
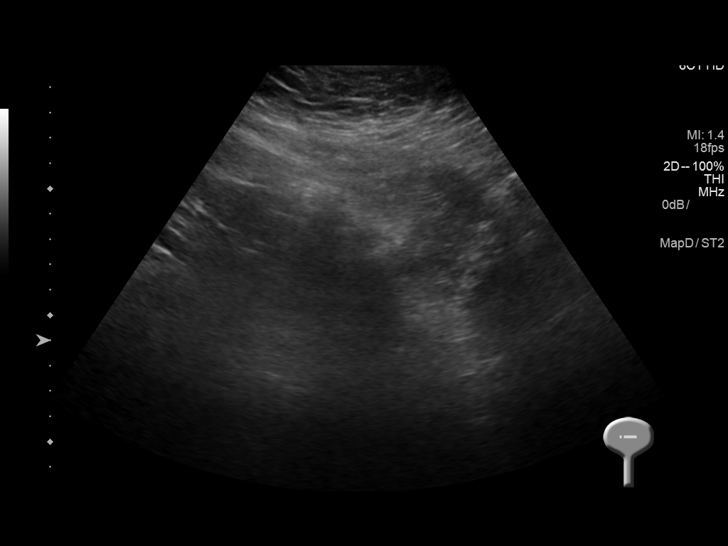

[Series 2001: us renal · 0.28mm/px · 1 of 4 slices shown (2 of 2)]
[im 4/4]
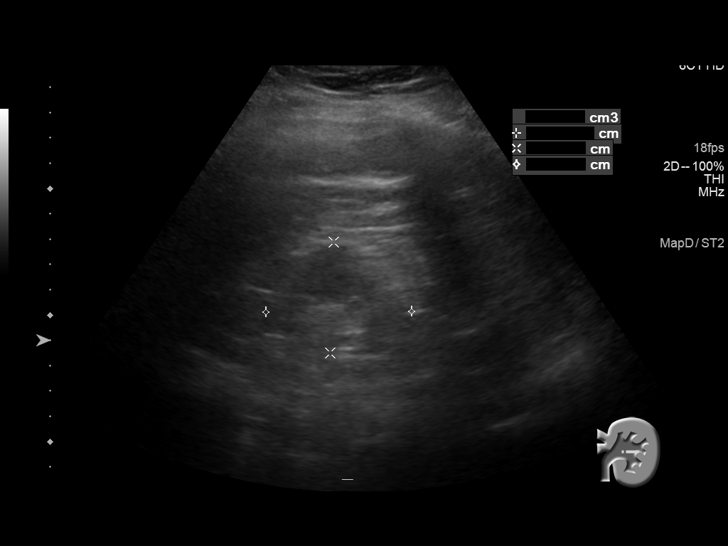

[14 of 25 positions shown; findings below may reference images not displayed]

FINDINGS: Right Kidney:

Renal measurements: 11.3 x 5.0 x 5.5 cm = volume: 161 mL .
Echogenicity within normal limits. No mass or hydronephrosis
visualized.

Left Kidney:

Renal measurements: 11.1 x 4.4 x 5.8 cm = volume: 147 mL.
Echogenicity within normal limits. No mass or hydronephrosis
visualized.

Bladder:

Appears normal for degree of bladder distention.
IMPRESSION: Negative exam.

## 2019-03-27 ENCOUNTER — Other Ambulatory Visit: Payer: Self-pay

## 2019-04-09 ENCOUNTER — Other Ambulatory Visit: Payer: Self-pay

## 2019-04-09 ENCOUNTER — Encounter: Payer: Self-pay | Admitting: Family Medicine

## 2019-04-09 ENCOUNTER — Ambulatory Visit (INDEPENDENT_AMBULATORY_CARE_PROVIDER_SITE_OTHER): Payer: Medicare Other | Admitting: Family Medicine

## 2019-04-09 ENCOUNTER — Ambulatory Visit (HOSPITAL_COMMUNITY)
Admission: RE | Admit: 2019-04-09 | Discharge: 2019-04-09 | Disposition: A | Discharge status: Home / Self Care | Payer: Medicare Other | Source: Ambulatory Visit | Attending: Family Medicine | Admitting: Family Medicine

## 2019-04-09 VITALS — BP 132/74 | Temp 97.2°F | Ht 62.0 in | Wt 205.0 lb

## 2019-04-09 DIAGNOSIS — R0781 Pleurodynia: Secondary | ICD-10-CM | POA: Diagnosis not present

## 2019-04-09 DIAGNOSIS — R079 Chest pain, unspecified: Secondary | ICD-10-CM | POA: Diagnosis not present

## 2019-04-09 NOTE — Progress Notes (Signed)
   Subjective:    Patient ID: Kimberly [name], female    DOB: Jun 13, 1951, 68 y.o.   MRN: [id number]  Back Pain This is a new problem. Episode onset: 2 weeks. Pain location: upper, mid right side and under right arm. Quality: dull to sharp. Exacerbated by: taking deep breaths. Treatments tried: ibuprofen, tylenol.   starte with moring painful aching urts when breathing in, feels it in the right lower posterior chest   Aching at times  No sig pain with motion or pivoting   BP generally    No majr injury hx   exercisign   Smoked a long time ago    Uses tyl and occasionallytakes    Review of Systems  Musculoskeletal: Positive for back pain.       Objective:   Physical Exam   Alert vitals stable, NAD. Blood pressure good on repeat. HEENT normal. Lungs clear. Heart regular rate and rhythm. Right posterior flank tender to deep palpation no CVA tenderness no spinal tenderness      Assessment & Plan:  Impression likely subacute thoracic strain.  With substantial anxiety on part of patient recommend chest x-ray Tylenol as needed.  Anti-inflammatory medicine sparingly expect slow resolution

## 2019-05-02 DIAGNOSIS — H524 Presbyopia: Secondary | ICD-10-CM | POA: Diagnosis not present

## 2019-05-02 DIAGNOSIS — E119 Type 2 diabetes mellitus without complications: Secondary | ICD-10-CM | POA: Diagnosis not present

## 2019-05-27 DIAGNOSIS — Z23 Encounter for immunization: Secondary | ICD-10-CM | POA: Diagnosis not present

## 2019-05-28 ENCOUNTER — Other Ambulatory Visit (HOSPITAL_COMMUNITY): Payer: Self-pay | Admitting: Family Medicine

## 2019-05-28 DIAGNOSIS — Z1231 Encounter for screening mammogram for malignant neoplasm of breast: Secondary | ICD-10-CM

## 2019-06-10 ENCOUNTER — Other Ambulatory Visit: Payer: Medicare Other

## 2019-06-10 ENCOUNTER — Other Ambulatory Visit: Payer: Self-pay | Admitting: Family Medicine

## 2019-06-19 ENCOUNTER — Ambulatory Visit (HOSPITAL_COMMUNITY): Payer: Medicare Other

## 2019-07-03 ENCOUNTER — Ambulatory Visit (HOSPITAL_COMMUNITY)
Admission: RE | Admit: 2019-07-03 | Discharge: 2019-07-03 | Disposition: A | Discharge status: Home / Self Care | Payer: Medicare Other | Source: Ambulatory Visit | Attending: Family Medicine | Admitting: Family Medicine

## 2019-07-03 ENCOUNTER — Ambulatory Visit (HOSPITAL_COMMUNITY): Payer: Medicare Other

## 2019-07-03 ENCOUNTER — Other Ambulatory Visit: Payer: Self-pay

## 2019-07-03 DIAGNOSIS — Z1231 Encounter for screening mammogram for malignant neoplasm of breast: Secondary | ICD-10-CM

## 2019-07-22 ENCOUNTER — Ambulatory Visit (INDEPENDENT_AMBULATORY_CARE_PROVIDER_SITE_OTHER): Payer: Medicare Other | Admitting: Family Medicine

## 2019-07-22 ENCOUNTER — Other Ambulatory Visit: Payer: Self-pay

## 2019-07-22 DIAGNOSIS — I10 Essential (primary) hypertension: Secondary | ICD-10-CM | POA: Diagnosis not present

## 2019-07-22 DIAGNOSIS — Z13220 Encounter for screening for lipoid disorders: Secondary | ICD-10-CM

## 2019-07-22 DIAGNOSIS — E039 Hypothyroidism, unspecified: Secondary | ICD-10-CM | POA: Diagnosis not present

## 2019-07-22 DIAGNOSIS — Z79899 Other long term (current) drug therapy: Secondary | ICD-10-CM

## 2019-07-22 DIAGNOSIS — E1121 Type 2 diabetes mellitus with diabetic nephropathy: Secondary | ICD-10-CM

## 2019-07-22 MED ORDER — LOSARTAN POTASSIUM 100 MG PO TABS: 100.0000 mg | ORAL_TABLET | Freq: Every morning | ORAL | 1 refills | Status: DC

## 2019-07-22 MED ORDER — HYDROCHLOROTHIAZIDE 25 MG PO TABS: 25.0000 mg | ORAL_TABLET | Freq: Every day | ORAL | 1 refills | Status: DC

## 2019-07-22 MED ORDER — GLIPIZIDE ER 5 MG PO TB24: ORAL_TABLET | ORAL | 1 refills | Status: DC

## 2019-07-22 MED ORDER — METOPROLOL SUCCINATE ER 100 MG PO TB24: ORAL_TABLET | ORAL | 1 refills | Status: DC

## 2019-07-22 MED ORDER — VENLAFAXINE HCL ER 75 MG PO CP24: ORAL_CAPSULE | ORAL | 1 refills | Status: DC

## 2019-07-22 MED ORDER — ZOSTER VAC RECOMB ADJUVANTED 50 MCG/0.5ML IM SUSR: 0.5000 mL | Freq: Once | INTRAMUSCULAR | 1 refills | Status: AC

## 2019-07-22 MED ORDER — METFORMIN HCL 500 MG PO TABS: ORAL_TABLET | ORAL | 1 refills | Status: DC

## 2019-07-22 MED ORDER — LEVOTHYROXINE SODIUM 125 MCG PO TABS: ORAL_TABLET | ORAL | 1 refills | Status: DC

## 2019-07-22 NOTE — Progress Notes (Signed)
   Subjective:  Audio only  Patient ID: Kimberly [name], female    DOB: [birth year], 68 y.o.   MRN: [id number]  Diabetes She presents for her follow-up diabetic visit. She has type 2 diabetes mellitus. Current diabetic treatments: metformin, glipizide. She is compliant with treatment all of the time. She is following a diabetic diet. Exercise: walks dog for exercise. Home blood sugar record trend: around 140 in the mornings.  Eye exam is current (months ago).   Ankles and feet hurt. Ankle seems to be weaker. Worse for the past 3 months. Takes tylenol arthritis for pain.   Would like to discuss if she should have a shingles vaccine.   Virtual Visit via Telephone Note  I connected with Kimberly [name] on 07/22/19 at  9:00 AM EST by telephone and verified that I am speaking with the correct person using two identifiers.  Location: Patient: home Provider: office   I discussed the limitations, risks, security and privacy concerns of performing an evaluation and management service by telephone and the availability of in person appointments. I also discussed with the patient that there may be a patient responsible charge related to this service. The patient expressed understanding and agreed to proceed.   History of Present Illness:    Observations/Objective:   Assessment and Plan:   Follow Up Instructions:    I discussed the assessment and treatment plan with the patient. The patient was provided an opportunity to ask questions and all were answered. The patient agreed with the plan and demonstrated an understanding of the instructions.   The patient was advised to call back or seek an in-person evaluation if the symptoms worsen or if the condition fails to improve as anticipated.  I provided 24 minutes of non-face-to-face time during this encounter.  Patient notes ongoing compliance with antidepressant medication. No obvious side effects. Reports does not miss a dose. Overall  continues to help depression substantially. No thoughts of homicide or suicide. Would like to maintain medication.  Patient claims compliance with diabetes medication. No obvious side effects. Reports no substantial low sugar spells. Most numbers are generally in good range when checked fasting. Generally does not miss a dose of medication. Watching diabetic diet closely  Blood pressure medicine and blood pressure levels reviewed today with patient. Compliant with blood pressure medicine. States does not miss a dose. No obvious side effects. Blood pressure generally good when checked elsewhere. Watching salt intake.   Patient compliant with her thyroid medication.  Does not miss a dose.  Has not had blood work for quite some time   Review of Systems No headache, no major weight loss or weight gain, no chest pain no back pain abdominal pain no change in bowel habits complete ROS otherwise negative     Objective:   Physical Exam  Virtual      Assessment & Plan:  Impression progressive ankle pain.  Discussed.  Likely arthritis  2.  Type 2 diabetes.  Apparent good control.  Await A1c results.  Medication compliance discussed  3.  Depression clinically stable patient wishes to maintain same medicines will maintain  4.  Hypertension.  Blood pressure overall good when checked elsewhere compliant with meds  Appropriate blood work.  Flu shot discussed.  Diet exercise discussed.  Medicines refilled.  Recheck 6 months

## 2019-07-24 ENCOUNTER — Encounter: Payer: Self-pay | Admitting: Family Medicine

## 2019-07-31 DIAGNOSIS — E039 Hypothyroidism, unspecified: Secondary | ICD-10-CM | POA: Diagnosis not present

## 2019-07-31 DIAGNOSIS — Z79899 Other long term (current) drug therapy: Secondary | ICD-10-CM | POA: Diagnosis not present

## 2019-07-31 DIAGNOSIS — I10 Essential (primary) hypertension: Secondary | ICD-10-CM | POA: Diagnosis not present

## 2019-07-31 DIAGNOSIS — Z13220 Encounter for screening for lipoid disorders: Secondary | ICD-10-CM | POA: Diagnosis not present

## 2019-07-31 DIAGNOSIS — E1121 Type 2 diabetes mellitus with diabetic nephropathy: Secondary | ICD-10-CM | POA: Diagnosis not present

## 2019-08-01 LAB — LIPID PANEL
Chol/HDL Ratio: 3.8 ratio (ref 0.0–4.4)
Cholesterol, Total: 144 mg/dL (ref 100–199)
HDL: 38 mg/dL — ABNORMAL LOW (ref >39)
LDL Chol Calc (NIH): 72 mg/dL (ref 0–99)
Triglycerides: 207 mg/dL — ABNORMAL HIGH (ref 0–149)
VLDL Cholesterol Cal: 34 mg/dL (ref 5–40)

## 2019-08-01 LAB — HEPATIC FUNCTION PANEL
ALT: 38 IU/L — ABNORMAL HIGH (ref 0–32)
AST: 31 IU/L (ref 0–40)
Albumin: 4.5 g/dL (ref 3.8–4.8)
Alkaline Phosphatase: 88 IU/L (ref 39–117)
Bilirubin Total: 0.5 mg/dL (ref 0.0–1.2)
Bilirubin, Direct: 0.15 mg/dL (ref 0.00–0.40)
Total Protein: 7.3 g/dL (ref 6.0–8.5)

## 2019-08-01 LAB — BASIC METABOLIC PANEL
BUN/Creatinine Ratio: 23 (ref 12–28)
BUN: 22 mg/dL (ref 8–27)
CO2: 24 mmol/L (ref 20–29)
Calcium: 9.6 mg/dL (ref 8.7–10.3)
Chloride: 99 mmol/L (ref 96–106)
Creatinine, Ser: 0.95 mg/dL (ref 0.57–1.00)
GFR calc Af Amer: 72 mL/min/{1.73_m2} (ref >59)
GFR calc non Af Amer: 62 mL/min/{1.73_m2} (ref >59)
Glucose: 170 mg/dL — ABNORMAL HIGH (ref 65–99)
Potassium: 4.6 mmol/L (ref 3.5–5.2)
Sodium: 138 mmol/L (ref 134–144)

## 2019-08-01 LAB — MICROALBUMIN / CREATININE URINE RATIO
Creatinine, Urine: 131.9 mg/dL
Microalb/Creat Ratio: 53 mg/g creat — ABNORMAL HIGH (ref 0–29)
Microalbumin, Urine: 69.7 ug/mL

## 2019-08-01 LAB — TSH: TSH: 2.47 u[IU]/mL (ref 0.450–4.500)

## 2019-08-01 LAB — HEMOGLOBIN A1C
Est. average glucose Bld gHb Est-mCnc: 189 mg/dL
Hgb A1c MFr Bld: 8.2 % — ABNORMAL HIGH (ref 4.8–5.6)

## 2019-08-08 ENCOUNTER — Telehealth: Payer: Self-pay | Admitting: Family Medicine

## 2019-08-08 MED ORDER — GLIPIZIDE ER 5 MG PO TB24: ORAL_TABLET | ORAL | 1 refills | Status: DC

## 2019-08-08 NOTE — Telephone Encounter (Signed)
Pt states she is now taking 2 in the morning. Is it ok to send in a new script with directions 2 qam

## 2019-08-08 NOTE — Telephone Encounter (Signed)
Pt contacted and verbalized understanding. Refills sent to [facility]

## 2019-08-08 NOTE — Telephone Encounter (Signed)
Pt was told to increase glipiZIDE (GLUCOTROL XL) 5 MG 24 hr tablet.   Please update on med list. Pt picked up script today and it still says one a day.

## 2019-08-08 NOTE — Telephone Encounter (Signed)
Yes six mo worth 

## 2019-08-08 NOTE — Telephone Encounter (Signed)
Tried to call and no answer to see how pt is taking it. I did not see a note about increasing it in the office note.

## 2019-08-11 ENCOUNTER — Ambulatory Visit: Payer: Medicare Other

## 2019-08-11 ENCOUNTER — Other Ambulatory Visit: Payer: Self-pay

## 2019-08-11 ENCOUNTER — Telehealth: Payer: Self-pay | Admitting: Radiology

## 2019-08-11 ENCOUNTER — Ambulatory Visit (INDEPENDENT_AMBULATORY_CARE_PROVIDER_SITE_OTHER): Payer: Medicare Other | Admitting: Orthopedic Surgery

## 2019-08-11 ENCOUNTER — Encounter: Payer: Self-pay | Admitting: Orthopedic Surgery

## 2019-08-11 VITALS — Temp 97.2°F | Ht 62.0 in | Wt 199.0 lb

## 2019-08-11 DIAGNOSIS — M1711 Unilateral primary osteoarthritis, right knee: Secondary | ICD-10-CM | POA: Diagnosis not present

## 2019-08-11 DIAGNOSIS — Z96651 Presence of right artificial knee joint: Secondary | ICD-10-CM | POA: Diagnosis not present

## 2019-08-11 DIAGNOSIS — M1712 Unilateral primary osteoarthritis, left knee: Secondary | ICD-10-CM

## 2019-08-11 DIAGNOSIS — M48062 Spinal stenosis, lumbar region with neurogenic claudication: Secondary | ICD-10-CM

## 2019-08-11 DIAGNOSIS — M1710 Unilateral primary osteoarthritis, unspecified knee: Secondary | ICD-10-CM

## 2019-08-11 DIAGNOSIS — Z96652 Presence of left artificial knee joint: Secondary | ICD-10-CM | POA: Diagnosis not present

## 2019-08-11 MED ORDER — GABAPENTIN 100 MG PO CAPS: 100.0000 mg | ORAL_CAPSULE | Freq: Three times a day (TID) | ORAL | 2 refills | Status: DC

## 2019-08-11 NOTE — Progress Notes (Signed)
Kimberly [name]  08/11/2019  Body mass index is 36.4 kg/m.   HISTORY SECTION :  Chief Complaint  Patient presents with  . Routine Post Op    yearly check on knee replacements has pain from waist down    HPI Kimberly [name] is here today for her bilateral total knee annual checkups.  She had a left total knee in 2016 and a right total knee in 2014 with a Sigma fixed bearing posterior stabilized total knee she is doing well with those she says she just cannot kneel on them  She is also complaining of bilateral thigh pain which seemed to worsen when she stopped her diclofenac secondary to her kidney disease.  She says she aches all over since that medication was discontinued  However she has a new complaint which started around Thanksgiving where she experienced 8 days of bilateral hip pain buttock pain lower back pain with bilateral leg pain left worse than right worse when standing requiring her to flex over the sink and also trouble walking for long periods of time   Review of Systems  Constitutional: Negative for chills, fever, malaise/fatigue and weight loss.  Neurological: Positive for weakness. Negative for tingling and sensory change.     has a past medical history of Arthritis, Depression, Diabetes mellitus, High blood pressure, Hypothyroid, Obesity, and PONV (postoperative nausea and vomiting).   Past Surgical History:  Procedure Laterality Date  . ABDOMINAL HYSTERECTOMY    . Bunions Right   . CARPAL TUNNEL RELEASE Right   . KNEE ARTHROSCOPY Right   . THYROID SURGERY     removal  . TOTAL KNEE ARTHROPLASTY Right 08/25/2013   Procedure: TOTAL KNEE ARTHROPLASTY;  Surgeon: Carole Civil, MD;  Location: [facility];  Service: Orthopedics;  Laterality: Right;  . TOTAL KNEE ARTHROPLASTY Left 09/09/2014   Procedure: LEFT TOTAL KNEE ARTHROPLASTY;  Surgeon: Carole Civil, MD;  Location: [facility];  Service: Orthopedics;  Laterality: Left;    Body mass index is 36.4  kg/m.   Allergies  Allergen Reactions  . Ace Inhibitors Cough     Current Outpatient Medications:  .  aspirin 81 MG tablet, Take 81 mg by mouth daily., Disp: , Rfl:  .  famotidine (PEPCID) 40 MG tablet, , Disp: , Rfl: 3 .  glipiZIDE (GLUCOTROL XL) 5 MG 24 hr tablet, Take 2 tablet by mouth every morning, Disp: 180 tablet, Rfl: 1 .  hydrochlorothiazide (HYDRODIURIL) 25 MG tablet, Take 1 tablet (25 mg total) by mouth daily., Disp: 90 tablet, Rfl: 1 .  levothyroxine (SYNTHROID) 125 MCG tablet, Take 1 tablet by mouth once daily before breakfast (Discontinue 135mcg), Disp: 90 tablet, Rfl: 1 .  linaclotide (LINZESS) 290 MCG CAPS capsule, Take 290 mcg by mouth daily before breakfast., Disp: , Rfl:  .  losartan (COZAAR) 100 MG tablet, Take 1 tablet (100 mg total) by mouth every morning., Disp: 90 tablet, Rfl: 1 .  metFORMIN (GLUCOPHAGE) 500 MG tablet, Take 1 tablet by mouth twice daily with a meal, Disp: 180 tablet, Rfl: 1 .  metoprolol succinate (TOPROL-XL) 100 MG 24 hr tablet, Take 1 tablet by mouth at bedtime with or immediately following a meal., Disp: 90 tablet, Rfl: 1 .  omeprazole (PRILOSEC) 20 MG capsule, , Disp: , Rfl:  .  traMADol (ULTRAM) 50 MG tablet, Two tablets po QHS prn pain, Disp: 30 tablet, Rfl: 4 .  venlafaxine XR (EFFEXOR-XR) 75 MG 24 hr capsule, Take 1 capsule by mouth daily with breakfast, Disp: 90  capsule, Rfl: 1 .  gabapentin (NEURONTIN) 100 MG capsule, Take 1 capsule (100 mg total) by mouth 3 (three) times daily., Disp: 90 capsule, Rfl: 2 No current facility-administered medications for this visit.  Facility-Administered Medications Ordered in Other Visits:  .  bupivacaine liposome (EXPAREL) 1.3 % injection 266 mg, 20 mL, Infiltration, Once, Carole Civil, MD   PHYSICAL EXAM SECTION: 1) Temp (!) 97.2 F (36.2 C)   Ht 5\' 2"  (1.575 m)   Wt 199 lb (90.3 kg)   BMI 36.40 kg/m   Body mass index is 36.4 kg/m. General appearance: Well-developed well-nourished no  gross deformities  2) Cardiovascular normal pulse and perfusion in the lower extremities normal color without edema  3) Neurologically deep tendon reflexes are equal and normal, no sensation loss or deficits no pathologic reflexes  4) Psychological: Awake alert and oriented x3 mood and affect normal  5) Skin no lacerations or ulcerations no nodularity no palpable masses, no erythema or nodularity  6) Musculoskeletal: She has normal strength in both hip flexors knee extensors knee flexors dorsiflexors and plantar flexors of both legs bilateral hip range of motion is asymptomatic and full.  She has tenderness in the lower back.  Straight leg raise is negative.   MEDICAL DECISION SECTION:  Encounter Diagnoses  Name Primary?  Marland Kitchen Arthritis of knee   . S/P total knee replacement, left 2016   . S/P total knee replacement, right 2014   . Spinal stenosis of lumbar region with neurogenic claudication Yes    Imaging Bilateral total knee films show stable prosthesis without any loosening  Plan:  (Rx., Inj., surg., Frx, MRI/CT, XR:2)  We are going to recommend gabapentin 100 mg 3 times a day  Physical therapy  Follow-up in 5 weeks if no improvement then we will start ordering x-rays.  I used a lumbar spine model to explain the pathogenesis of spinal stenosis patient seem to voiced understanding  Meds ordered this encounter  Medications  . gabapentin (NEURONTIN) 100 MG capsule    Sig: Take 1 capsule (100 mg total) by mouth 3 (three) times daily.    Dispense:  90 capsule    Refill:  2     2:08 PM Arther Abbott, MD  08/11/2019

## 2019-08-11 NOTE — Telephone Encounter (Signed)
-----   Message from Uvaldo Bristle sent at 08/11/2019  2:17 PM EST ----- Austin Pongratz, Regarding physical therapy orders for patient Kimberly [name], can she have therapy done through [facility] in [place] where she lives?

## 2019-08-11 NOTE — Patient Instructions (Signed)
Spinal Stenosis ° °Spinal stenosis happens when the open space (spinal canal) between the bones of your spine (vertebrae) gets smaller. It is caused by bone pushing into the open spaces of your backbone (spine). This puts pressure on your backbone and the nerves in your backbone. Treatment often focuses on managing any pain and symptoms. In some cases, surgery may be needed. °Follow these instructions at home: °Managing pain, stiffness, and swelling ° °· Do all exercises and stretches as told by your doctor. °· Stand and sit up straight (use good posture). If you were given a brace or a corset, wear it as told by your doctor. °· Do not do any activities that cause pain. Ask your doctor what activities are safe for you. °· Do not lift anything that is heavier than 10 lb (4.5 kg) or heavier than your doctor tells you. °· Try to stay at a healthy weight. Talk with your doctor if you need help losing weight. °· If directed, put heat on the affected area as often as told by your doctor. Use the heat source that your doctor recommends, such as a moist heat pack or a heating pad. °? Put a towel between your skin and the heat source. °? Leave the heat on for 20-30 minutes. °? Remove the heat if your skin turns bright red. This is especially important if you are not able to feel pain, heat, or cold. You may have a greater risk of getting burned. °General instructions °· Take over-the-counter and prescription medicines only as told by your doctor. °· Do not use any products that contain nicotine or tobacco, such as cigarettes and e-cigarettes. If you need help quitting, ask your doctor. °· Eat a healthy diet. This includes plenty of fruits and vegetables, whole grains, and low-fat (lean) protein. °· Keep all follow-up visits as told by your doctor. This is important. °Contact a doctor if: °· Your symptoms do not get better. °· Your symptoms get worse. °· You have a fever. °Get help right away if: °· You have new or worse pain  in your neck or upper back. °· You have very bad pain that medicine does not control. °· You are dizzy. °· You have vision problems, blurred vision, or double vision. °· You have a very bad headache that is worse when you stand. °· You feel sick to your stomach (nauseous). °· You throw up (vomit). °· You have new or worse numbness or tingling in your back or legs. °· You have pain, redness, swelling, or warmth in your arm or leg. °Summary °· Spinal stenosis happens when the open space (spinal canal) between the bones of your spine gets smaller (narrow). °· Contact a doctor if your symptoms get worse. °· In some cases, surgery may be needed. °This information is not intended to replace advice given to you by your health care provider. Make sure you discuss any questions you have with your health care provider. °Document Released: 12/08/2010 Document Revised: 07/27/2017 Document Reviewed: 07/19/2016 °Elsevier Patient Education © 2020 [employer] ° °

## 2019-08-13 ENCOUNTER — Ambulatory Visit: Payer: Medicare Other | Admitting: Orthopedic Surgery

## 2019-08-19 ENCOUNTER — Other Ambulatory Visit: Payer: Self-pay | Admitting: *Deleted

## 2019-08-19 ENCOUNTER — Telehealth: Payer: Self-pay | Admitting: Family Medicine

## 2019-08-19 MED ORDER — GLIPIZIDE ER 5 MG PO TB24: ORAL_TABLET | ORAL | 0 refills | Status: DC

## 2019-08-19 MED ORDER — HYDROCHLOROTHIAZIDE 25 MG PO TABS: 25.0000 mg | ORAL_TABLET | Freq: Every day | ORAL | 0 refills | Status: DC

## 2019-08-19 MED ORDER — LOSARTAN POTASSIUM 100 MG PO TABS: 100.0000 mg | ORAL_TABLET | Freq: Every morning | ORAL | 0 refills | Status: DC

## 2019-08-19 MED ORDER — METFORMIN HCL 500 MG PO TABS: ORAL_TABLET | ORAL | 0 refills | Status: DC

## 2019-08-19 NOTE — Telephone Encounter (Signed)
Patient is requesting refills on medications due to the facts that they delayed from mail order and she is out of her medications.She is needing glipizide 5 mg, losartan 100 mg, hydrochlorothiazide 25 mg, metformin 500 mg called into [facility].States her sugar has been in the one 160 and 12/13-163,12/14-165,12/16-160 this was fasting

## 2019-08-19 NOTE — Telephone Encounter (Signed)
I will send in refills per protocol but wanted to send message so you can review sugar readings.

## 2019-08-19 NOTE — Telephone Encounter (Signed)
Discussed with pt to cst and I let her know her refills were sent to pharm.

## 2019-08-19 NOTE — Telephone Encounter (Signed)
Meds inncr within just the last mo cst

## 2019-09-01 DIAGNOSIS — M48062 Spinal stenosis, lumbar region with neurogenic claudication: Secondary | ICD-10-CM | POA: Diagnosis not present

## 2019-09-08 DIAGNOSIS — M48062 Spinal stenosis, lumbar region with neurogenic claudication: Secondary | ICD-10-CM | POA: Diagnosis not present

## 2019-09-11 DIAGNOSIS — M48062 Spinal stenosis, lumbar region with neurogenic claudication: Secondary | ICD-10-CM | POA: Diagnosis not present

## 2019-09-15 DIAGNOSIS — M48062 Spinal stenosis, lumbar region with neurogenic claudication: Secondary | ICD-10-CM | POA: Diagnosis not present

## 2019-09-17 ENCOUNTER — Ambulatory Visit (INDEPENDENT_AMBULATORY_CARE_PROVIDER_SITE_OTHER): Payer: Medicare Other | Admitting: Orthopedic Surgery

## 2019-09-17 ENCOUNTER — Encounter: Payer: Self-pay | Admitting: Orthopedic Surgery

## 2019-09-17 ENCOUNTER — Other Ambulatory Visit: Payer: Self-pay

## 2019-09-17 VITALS — BP 144/82 | HR 99 | Ht 63.0 in | Wt 200.0 lb

## 2019-09-17 DIAGNOSIS — M48062 Spinal stenosis, lumbar region with neurogenic claudication: Secondary | ICD-10-CM | POA: Diagnosis not present

## 2019-09-17 MED ORDER — GABAPENTIN 100 MG PO CAPS: 100.0000 mg | ORAL_CAPSULE | Freq: Three times a day (TID) | ORAL | 2 refills | Status: DC

## 2019-09-17 NOTE — Progress Notes (Signed)
Follow-up visit  42 last seen in December diagnosed with spinal stenosis treated with physical therapy and gabapentin.  She says she is much better less back pain less leg pain improved walking ability.  Said the therapy really helped  As far as her gabapentin goes she is taking it twice a day was prescribed 3 times a day she did get some drowsiness from it but other than that no problems and it really helped  Encounter Diagnosis  Name Primary?  . Spinal stenosis of lumbar region with neurogenic claudication Yes    Meds ordered this encounter  Medications  . gabapentin (NEURONTIN) 100 MG capsule    Sig: Take 1 capsule (100 mg total) by mouth 3 (three) times daily.    Dispense:  90 capsule    Refill:  2      Chronic problem stable, prescription management, outside records  PT notes reviewed from [facility] dated January 18 these notes confirmed patient says "so much better" pain levels have decreased not getting sharp sharp shocking pains down the legs although she was having some limited ability to stand and walk due to the pain on the left side of the back

## 2019-09-17 NOTE — Patient Instructions (Signed)
Continue gabapentin twice a day  Continue and finish therapy and then maintain exercises on your own  Call us if things get worse again and we will see you on a yearly basis for the knees

## 2019-09-18 DIAGNOSIS — M48062 Spinal stenosis, lumbar region with neurogenic claudication: Secondary | ICD-10-CM | POA: Diagnosis not present

## 2019-09-22 DIAGNOSIS — M48062 Spinal stenosis, lumbar region with neurogenic claudication: Secondary | ICD-10-CM | POA: Diagnosis not present

## 2019-09-25 ENCOUNTER — Telehealth: Payer: Self-pay | Admitting: Orthopedic Surgery

## 2019-09-25 DIAGNOSIS — M48062 Spinal stenosis, lumbar region with neurogenic claudication: Secondary | ICD-10-CM | POA: Diagnosis not present

## 2019-09-25 NOTE — Telephone Encounter (Signed)
I called her, mailbox is full

## 2019-09-25 NOTE — Telephone Encounter (Addendum)
She has questions regarding her Gabapentin.  She asks that you call her back  Thanks

## 2019-09-29 DIAGNOSIS — M48062 Spinal stenosis, lumbar region with neurogenic claudication: Secondary | ICD-10-CM | POA: Diagnosis not present

## 2019-09-30 NOTE — Telephone Encounter (Signed)
Unable to reach.

## 2019-10-02 ENCOUNTER — Encounter: Payer: Self-pay | Admitting: Family Medicine

## 2019-10-02 DIAGNOSIS — M48062 Spinal stenosis, lumbar region with neurogenic claudication: Secondary | ICD-10-CM | POA: Diagnosis not present

## 2019-10-06 DIAGNOSIS — M48062 Spinal stenosis, lumbar region with neurogenic claudication: Secondary | ICD-10-CM | POA: Diagnosis not present

## 2019-10-09 DIAGNOSIS — M48062 Spinal stenosis, lumbar region with neurogenic claudication: Secondary | ICD-10-CM | POA: Diagnosis not present

## 2019-10-13 DIAGNOSIS — M48062 Spinal stenosis, lumbar region with neurogenic claudication: Secondary | ICD-10-CM | POA: Diagnosis not present

## 2019-11-11 ENCOUNTER — Telehealth: Payer: Self-pay | Admitting: Family Medicine

## 2019-11-11 ENCOUNTER — Other Ambulatory Visit: Payer: Self-pay | Admitting: *Deleted

## 2019-11-11 MED ORDER — METOPROLOL SUCCINATE ER 100 MG PO TB24: ORAL_TABLET | ORAL | 1 refills | Status: DC

## 2019-11-11 MED ORDER — TRAMADOL HCL 50 MG PO TABS: ORAL_TABLET | ORAL | 5 refills | Status: DC

## 2019-11-11 MED ORDER — LOSARTAN POTASSIUM 100 MG PO TABS: 100.0000 mg | ORAL_TABLET | Freq: Every morning | ORAL | 1 refills | Status: DC

## 2019-11-11 MED ORDER — METFORMIN HCL 500 MG PO TABS: ORAL_TABLET | ORAL | 1 refills | Status: DC

## 2019-11-11 MED ORDER — VENLAFAXINE HCL ER 75 MG PO CP24: ORAL_CAPSULE | ORAL | 1 refills | Status: DC

## 2019-11-11 MED ORDER — LEVOTHYROXINE SODIUM 125 MCG PO TABS: ORAL_TABLET | ORAL | 1 refills | Status: DC

## 2019-11-11 MED ORDER — HYDROCHLOROTHIAZIDE 25 MG PO TABS: 25.0000 mg | ORAL_TABLET | Freq: Every day | ORAL | 1 refills | Status: DC

## 2019-11-11 NOTE — Telephone Encounter (Signed)
Patient is requesting refills on all medications to be sent to [facility]--Glipizide 5 mg                Hydrochlorothiazide 25 mg                Levothyroxine 125 mg                Losartan 100 mg                Meformin 500 mg               Metoprolol succinate 100 mg               Tramadol 50 mg               Venafaxine  75 mg Please advise

## 2019-11-11 NOTE — Telephone Encounter (Signed)
6 mo ok

## 2019-11-11 NOTE — Telephone Encounter (Signed)
Meds sent to pharm and pt was notified 

## 2019-11-11 NOTE — Telephone Encounter (Signed)
Last med check up 07/22/19

## 2019-11-14 DIAGNOSIS — E559 Vitamin D deficiency, unspecified: Secondary | ICD-10-CM | POA: Diagnosis not present

## 2019-11-14 DIAGNOSIS — D631 Anemia in chronic kidney disease: Secondary | ICD-10-CM | POA: Diagnosis not present

## 2019-11-14 DIAGNOSIS — N1831 Chronic kidney disease, stage 3a: Secondary | ICD-10-CM | POA: Diagnosis not present

## 2019-11-14 DIAGNOSIS — Z79899 Other long term (current) drug therapy: Secondary | ICD-10-CM | POA: Diagnosis not present

## 2019-11-14 DIAGNOSIS — R809 Proteinuria, unspecified: Secondary | ICD-10-CM | POA: Diagnosis not present

## 2019-11-19 DIAGNOSIS — E559 Vitamin D deficiency, unspecified: Secondary | ICD-10-CM | POA: Diagnosis not present

## 2019-11-19 DIAGNOSIS — I5032 Chronic diastolic (congestive) heart failure: Secondary | ICD-10-CM | POA: Diagnosis not present

## 2019-11-19 DIAGNOSIS — E1122 Type 2 diabetes mellitus with diabetic chronic kidney disease: Secondary | ICD-10-CM | POA: Diagnosis not present

## 2019-11-19 DIAGNOSIS — I129 Hypertensive chronic kidney disease with stage 1 through stage 4 chronic kidney disease, or unspecified chronic kidney disease: Secondary | ICD-10-CM | POA: Diagnosis not present

## 2019-11-19 DIAGNOSIS — N189 Chronic kidney disease, unspecified: Secondary | ICD-10-CM | POA: Diagnosis not present

## 2019-11-19 DIAGNOSIS — Z79899 Other long term (current) drug therapy: Secondary | ICD-10-CM | POA: Diagnosis not present

## 2019-11-19 DIAGNOSIS — R809 Proteinuria, unspecified: Secondary | ICD-10-CM | POA: Diagnosis not present

## 2020-01-05 ENCOUNTER — Other Ambulatory Visit: Payer: Self-pay

## 2020-01-05 ENCOUNTER — Ambulatory Visit (INDEPENDENT_AMBULATORY_CARE_PROVIDER_SITE_OTHER): Payer: Medicare Other | Admitting: Family Medicine

## 2020-01-05 ENCOUNTER — Encounter: Payer: Self-pay | Admitting: Family Medicine

## 2020-01-05 VITALS — BP 136/82 | Temp 96.9°F | Wt 207.6 lb

## 2020-01-05 DIAGNOSIS — I10 Essential (primary) hypertension: Secondary | ICD-10-CM

## 2020-01-05 DIAGNOSIS — M48062 Spinal stenosis, lumbar region with neurogenic claudication: Secondary | ICD-10-CM | POA: Diagnosis not present

## 2020-01-05 DIAGNOSIS — E039 Hypothyroidism, unspecified: Secondary | ICD-10-CM | POA: Diagnosis not present

## 2020-01-05 DIAGNOSIS — E1121 Type 2 diabetes mellitus with diabetic nephropathy: Secondary | ICD-10-CM

## 2020-01-05 DIAGNOSIS — N183 Chronic kidney disease, stage 3 unspecified: Secondary | ICD-10-CM | POA: Diagnosis not present

## 2020-01-05 LAB — POCT GLYCOSYLATED HEMOGLOBIN (HGB A1C): Hemoglobin A1C: 7.1 % — AB (ref 4.0–5.6)

## 2020-01-05 MED ORDER — OMEPRAZOLE 20 MG PO CPDR: DELAYED_RELEASE_CAPSULE | ORAL | 3 refills | Status: DC

## 2020-01-05 MED ORDER — HYDROCHLOROTHIAZIDE 25 MG PO TABS: 25.0000 mg | ORAL_TABLET | Freq: Every day | ORAL | 1 refills | Status: DC

## 2020-01-05 MED ORDER — LOSARTAN POTASSIUM 100 MG PO TABS: 100.0000 mg | ORAL_TABLET | Freq: Every morning | ORAL | 1 refills | Status: DC

## 2020-01-05 MED ORDER — FAMOTIDINE 40 MG PO TABS
ORAL_TABLET | ORAL | 3 refills | Status: DC
Start: 2020-01-05 — End: 2020-01-06

## 2020-01-05 MED ORDER — LEVOTHYROXINE SODIUM 125 MCG PO TABS: ORAL_TABLET | ORAL | 1 refills | Status: DC

## 2020-01-05 MED ORDER — VENLAFAXINE HCL ER 75 MG PO CP24: ORAL_CAPSULE | ORAL | 1 refills | Status: DC

## 2020-01-05 MED ORDER — METOPROLOL SUCCINATE ER 100 MG PO TB24: ORAL_TABLET | ORAL | 1 refills | Status: DC

## 2020-01-05 MED ORDER — GLIPIZIDE ER 5 MG PO TB24: ORAL_TABLET | ORAL | 1 refills | Status: DC

## 2020-01-05 MED ORDER — GABAPENTIN 100 MG PO CAPS: 100.0000 mg | ORAL_CAPSULE | Freq: Three times a day (TID) | ORAL | 1 refills | Status: DC

## 2020-01-05 MED ORDER — METFORMIN HCL 500 MG PO TABS: ORAL_TABLET | ORAL | 1 refills | Status: DC

## 2020-01-05 NOTE — Progress Notes (Signed)
Subjective:  Patient arrives with numerous concerns  Patient ID: Kimberly [name], female    DOB: 1950/09/13, 69 y.o.   MRN: [id number]  Diabetes She presents for her follow-up diabetic visit. She has type 2 diabetes mellitus. There are no hypoglycemic associated symptoms. There are no diabetic associated symptoms. There are no hypoglycemic complications. There are no diabetic complications. She participates in exercise intermittently. She sees a podiatrist.Eye exam is current.  Pt is wanting to know if provider will take over refills on Gabapentin 100 mg. Ortho was prescribing Gabapentin but pt has hard time getting in touch with them. Seen ortho about 3 months ago.   Results for orders placed or performed in visit on 07/22/19  Lipid Profile  Result Value Ref Range   Cholesterol, Total 144 100 - 199 mg/dL   Triglycerides 207 (H) 0 - 149 mg/dL   HDL 38 (L) >39 mg/dL   VLDL Cholesterol Cal 34 5 - 40 mg/dL   LDL Chol Calc (NIH) 72 0 - 99 mg/dL   Chol/HDL Ratio 3.8 0.0 - 4.4 ratio  Hepatic function panel  Result Value Ref Range   Total Protein 7.3 6.0 - 8.5 g/dL   Albumin 4.5 3.8 - 4.8 g/dL   Bilirubin Total 0.5 0.0 - 1.2 mg/dL   Bilirubin, Direct 0.15 0.00 - 0.40 mg/dL   Alkaline Phosphatase 88 39 - 117 IU/L   AST 31 0 - 40 IU/L   ALT 38 (H) 0 - 32 IU/L  Basic Metabolic Panel (BMET)  Result Value Ref Range   Glucose 170 (H) 65 - 99 mg/dL   BUN 22 8 - 27 mg/dL   Creatinine, Ser 0.95 0.57 - 1.00 mg/dL   GFR calc non Af Amer 62 >59 mL/min/1.73   GFR calc Af Amer 72 >59 mL/min/1.73   BUN/Creatinine Ratio 23 12 - 28   Sodium 138 134 - 144 mmol/L   Potassium 4.6 3.5 - 5.2 mmol/L   Chloride 99 96 - 106 mmol/L   CO2 24 20 - 29 mmol/L   Calcium 9.6 8.7 - 10.3 mg/dL  Hemoglobin A1c  Result Value Ref Range   Hgb A1c MFr Bld 8.2 (H) 4.8 - 5.6 %   Est. average glucose Bld gHb Est-mCnc 189 mg/dL  Urine Microalbumin w/creat. ratio  Result Value Ref Range   Creatinine, Urine 131.9 Not  Estab. mg/dL   Microalbumin, Urine 69.7 Not Estab. ug/mL   Microalb/Creat Ratio 53 (H) 0 - 29 mg/g creat  TSH  Result Value Ref Range   TSH 2.470 0.450 - 4.500 uIU/mL   Results for orders placed or performed in visit on 07/22/19  Lipid Profile  Result Value Ref Range   Cholesterol, Total 144 100 - 199 mg/dL   Triglycerides 207 (H) 0 - 149 mg/dL   HDL 38 (L) >39 mg/dL   VLDL Cholesterol Cal 34 5 - 40 mg/dL   LDL Chol Calc (NIH) 72 0 - 99 mg/dL   Chol/HDL Ratio 3.8 0.0 - 4.4 ratio  Hepatic function panel  Result Value Ref Range   Total Protein 7.3 6.0 - 8.5 g/dL   Albumin 4.5 3.8 - 4.8 g/dL   Bilirubin Total 0.5 0.0 - 1.2 mg/dL   Bilirubin, Direct 0.15 0.00 - 0.40 mg/dL   Alkaline Phosphatase 88 39 - 117 IU/L   AST 31 0 - 40 IU/L   ALT 38 (H) 0 - 32 IU/L  Basic Metabolic Panel (BMET)  Result Value Ref Range   Glucose  170 (H) 65 - 99 mg/dL   BUN 22 8 - 27 mg/dL   Creatinine, Ser 0.95 0.57 - 1.00 mg/dL   GFR calc non Af Amer 62 >59 mL/min/1.73   GFR calc Af Amer 72 >59 mL/min/1.73   BUN/Creatinine Ratio 23 12 - 28   Sodium 138 134 - 144 mmol/L   Potassium 4.6 3.5 - 5.2 mmol/L   Chloride 99 96 - 106 mmol/L   CO2 24 20 - 29 mmol/L   Calcium 9.6 8.7 - 10.3 mg/dL  Hemoglobin A1c  Result Value Ref Range   Hgb A1c MFr Bld 8.2 (H) 4.8 - 5.6 %   Est. average glucose Bld gHb Est-mCnc 189 mg/dL  Urine Microalbumin w/creat. ratio  Result Value Ref Range   Creatinine, Urine 131.9 Not Estab. mg/dL   Microalbumin, Urine 69.7 Not Estab. ug/mL   Microalb/Creat Ratio 53 (H) 0 - 29 mg/g creat  TSH  Result Value Ref Range   TSH 2.470 0.450 - 4.500 uIU/mL   7.1 per cent   Blood pressure medicine and blood pressure levels reviewed today with patient. Compliant with blood pressure medicine. States does not miss a dose. No obvious side effects. Blood pressure generally good when checked elsewhere. Watching salt intake.   Numbers running good    Trying to walk daily,  Has been having  back problems   Saw the ortho   Has gone thru physical therapy   Walking has gone down significant   neurontin has helped and cut down the pain.  Patient would like to maintain this.  Requests we take over refills   moderna vCCINE GOT TWO SHOTS, TOOK TYL PRN    Has stopped tramadol   Pt notes foot discomfort. Dry scaly discomfort an pain   Patient claims compliance with diabetes medication. No obvious side effects. Reports no substantial low sugar spells. Most numbers are generally in good range when checked fasting. Generally does not miss a dose of medication. Watching diabetic diet closely   History of depression.  Clinically stable.  To maintain same dose of medication  Known history of hypothyroidism.  TSH excellent 6 months ago   Review of Systems No headache no chest pain no shortness of breath    Objective:   Physical Exam Alert and oriented, vitals reviewed and stable, NAD ENT-TM's and ext canals WNL bilat via otoscopic exam Soft palate, tonsils and post pharynx WNL via oropharyngeal exam Neck-symmetric, no masses; thyroid nonpalpable and nontender Pulmonary-no tachypnea or accessory muscle use; Clear without wheezes via auscultation Card--no abnrml murmurs, rhythm reg and rate WNL Carotid pulses symmetric, without bruits   Feet exam bilateral excellent pulses slight scaly eczema-like changes great toes bilateral.  Sensation diffuse and intact except plantar surface of great toes.  Some hammertoe evident on second toe bilateral       Assessment & Plan:  Impression 1 hypertension very good control discussed maintain same meds  2.  Hyperlipidemia.  Status uncertain check blood work  3.  Type 2 diabetes.  A1c very good at 7.1.  Patient to maintain diet exercise and compliance discussed  4.  Hypothyroidism stable  5.  Neurogenic claudication intermittent and uncommon Neurontin helping very much.  Will refill at patient's request.  Has been prescribed per  Dr. Aline Brochure  5.  Depression clinically stable  6.  Site eczema ~changes nonspecific distal great toe numbness.  Not enough for medical work-up at this time discussed  7.  Chronic kidney disease followed by the  specialist  As per orders

## 2020-01-06 ENCOUNTER — Other Ambulatory Visit: Payer: Self-pay

## 2020-01-06 DIAGNOSIS — M48062 Spinal stenosis, lumbar region with neurogenic claudication: Secondary | ICD-10-CM

## 2020-01-06 MED ORDER — VENLAFAXINE HCL ER 75 MG PO CP24: ORAL_CAPSULE | ORAL | 1 refills | Status: DC

## 2020-01-06 MED ORDER — METFORMIN HCL 500 MG PO TABS: ORAL_TABLET | ORAL | 1 refills | Status: DC

## 2020-01-06 MED ORDER — METOPROLOL SUCCINATE ER 100 MG PO TB24: ORAL_TABLET | ORAL | 1 refills | Status: DC

## 2020-01-06 MED ORDER — FAMOTIDINE 40 MG PO TABS: ORAL_TABLET | ORAL | 3 refills | Status: DC

## 2020-01-06 MED ORDER — HYDROCHLOROTHIAZIDE 25 MG PO TABS: 25.0000 mg | ORAL_TABLET | Freq: Every day | ORAL | 1 refills | Status: DC

## 2020-01-06 MED ORDER — GLIPIZIDE ER 5 MG PO TB24: ORAL_TABLET | ORAL | 1 refills | Status: DC

## 2020-01-06 MED ORDER — OMEPRAZOLE 20 MG PO CPDR: DELAYED_RELEASE_CAPSULE | ORAL | 3 refills | Status: DC

## 2020-01-06 MED ORDER — LOSARTAN POTASSIUM 100 MG PO TABS: 100.0000 mg | ORAL_TABLET | Freq: Every morning | ORAL | 1 refills | Status: DC

## 2020-01-06 MED ORDER — GABAPENTIN 100 MG PO CAPS: 100.0000 mg | ORAL_CAPSULE | Freq: Three times a day (TID) | ORAL | 1 refills | Status: DC

## 2020-01-06 MED ORDER — LEVOTHYROXINE SODIUM 125 MCG PO TABS: ORAL_TABLET | ORAL | 1 refills | Status: DC

## 2020-01-21 ENCOUNTER — Other Ambulatory Visit: Payer: Self-pay | Admitting: *Deleted

## 2020-01-21 ENCOUNTER — Telehealth: Payer: Self-pay | Admitting: Family Medicine

## 2020-01-21 MED ORDER — TRIAMCINOLONE ACETONIDE 0.1 % EX CREA
1.0000 | TOPICAL_CREAM | Freq: Two times a day (BID) | CUTANEOUS | 2 refills | Status: DC
Start: 2020-01-21 — End: 2020-04-09

## 2020-01-21 NOTE — Telephone Encounter (Signed)
Please advise. Thank you

## 2020-01-21 NOTE — Telephone Encounter (Signed)
Refills sent and pt was notified.  

## 2020-01-21 NOTE — Telephone Encounter (Signed)
Patient was seen 5/10 and requesting a steroid cream called in for her foot. She states spoke of cream at appointment but wasn't sent in. Kimberly [name]

## 2020-01-21 NOTE — Telephone Encounter (Signed)
Triamcinolone 0.1 per cent 30 g bid to affected area two ref

## 2020-04-09 ENCOUNTER — Encounter: Payer: Self-pay | Admitting: Family Medicine

## 2020-04-09 ENCOUNTER — Other Ambulatory Visit: Payer: Self-pay

## 2020-04-09 ENCOUNTER — Ambulatory Visit (INDEPENDENT_AMBULATORY_CARE_PROVIDER_SITE_OTHER): Payer: Medicare Other | Admitting: Family Medicine

## 2020-04-09 VITALS — BP 146/84 | HR 102 | Temp 98.2°F | Wt 207.2 lb

## 2020-04-09 DIAGNOSIS — E039 Hypothyroidism, unspecified: Secondary | ICD-10-CM

## 2020-04-09 DIAGNOSIS — Z0000 Encounter for general adult medical examination without abnormal findings: Secondary | ICD-10-CM

## 2020-04-09 DIAGNOSIS — E1121 Type 2 diabetes mellitus with diabetic nephropathy: Secondary | ICD-10-CM

## 2020-04-09 DIAGNOSIS — I10 Essential (primary) hypertension: Secondary | ICD-10-CM

## 2020-04-09 DIAGNOSIS — N3941 Urge incontinence: Secondary | ICD-10-CM | POA: Diagnosis not present

## 2020-04-09 NOTE — Patient Instructions (Addendum)
Obtain labs fasting in the next week.  Follow up with urology for incontinence.    Urinary Incontinence  Urinary incontinence refers to a condition in which a person is unable to control where and when to pass urine. A person with this condition will urinate when he or she does not mean to (involuntarily). What are the causes? This condition may be caused by:  Medicines.  Infections.  Constipation.  Overactive bladder muscles.  Weak bladder muscles.  Weak pelvic floor muscles. These muscles provide support for the bladder, intestine, and, in women, the uterus.  Enlarged prostate in men. The prostate is a gland near the bladder. When it gets too big, it can pinch the urethra. With the urethra blocked, the bladder can weaken and lose the ability to empty properly.  Surgery.  Emotional factors, such as anxiety, stress, or post-traumatic stress disorder (PTSD).  Pelvic organ prolapse. This happens in women when organs shift out of place and into the vagina. This shift can prevent the bladder and urethra from working properly. What increases the risk? The following factors may make you more likely to develop this condition:  Older age.  Obesity and physical inactivity.  Pregnancy and childbirth.  Menopause.  Diseases that affect the nerves or spinal cord (neurological diseases).  Long-term (chronic) coughing. This can increase pressure on the bladder and pelvic floor muscles. What are the signs or symptoms? Symptoms may vary depending on the type of urinary incontinence you have. They include:  A sudden urge to urinate, but passing urine involuntarily before you can get to a bathroom (urge incontinence).  Suddenly passing urine with any activity that forces urine to pass, such as coughing, laughing, exercise, or sneezing (stress incontinence).  Needing to urinate often, but urinating only a small amount, or constantly dribbling urine (overflow incontinence).  Urinating  because you cannot get to the bathroom in time due to a physical disability, such as arthritis or injury, or communication and thinking problems, such as Alzheimer disease (functional incontinence). How is this diagnosed? This condition may be diagnosed based on:  Your medical history.  A physical exam.  Tests, such as: ? Urine tests. ? X-rays of your kidney and bladder. ? Ultrasound. ? CT scan. ? Cystoscopy. In this procedure, a health care provider inserts a tube with a light and camera (cystoscope) through the urethra and into the bladder in order to check for problems. ? Urodynamic testing. These tests assess how well the bladder, urethra, and sphincter can store and release urine. There are different types of urodynamic tests, and they vary depending on what the test is measuring. To help diagnose your condition, your health care provider may recommend that you keep a log of when you urinate and how much you urinate. How is this treated? Treatment for this condition depends on the type of incontinence that you have and its cause. Treatment may include:  Lifestyle changes, such as: ? Quitting smoking. ? Maintaining a healthy weight. ? Staying active. Try to get 150 minutes of moderate-intensity exercise every week. Ask your health care provider which activities are safe for you. ? Eating a healthy diet.  Avoid high-fat foods, like fried foods.  Avoid refined carbohydrates like white bread and white rice.  Limit how much alcohol and caffeine you drink.  Increase your fiber intake. Foods such as fresh fruits, vegetables, beans, and whole grains are healthy sources of fiber.  Pelvic floor muscle exercises.  Bladder training, such as lengthening the amount of time  between bathroom breaks, or using the bathroom at regular intervals.  Using techniques to suppress bladder urges. This can include distraction techniques or controlled breathing exercises.  Medicines to relax the  bladder muscles and prevent bladder spasms.  Medicines to help slow or prevent the growth of a man's prostate.  Botox injections. These can help relax the bladder muscles.  Using pulses of electricity to help change bladder reflexes (electrical nerve stimulation).  For women, using a medical device to prevent urine leaks. This is a small, tampon-like, disposable device that is inserted into the urethra.  Injecting collagen or carbon beads (bulking agents) into the urinary sphincter. These can help thicken tissue and close the bladder opening.  Surgery. Follow these instructions at home: Lifestyle  Limit alcohol and caffeine. These can fill your bladder quickly and irritate it.  Keep yourself clean to help prevent odors and skin damage. Ask your doctor about special skin creams and cleansers that can protect the skin from urine.  Consider wearing pads or adult diapers. Make sure to change them regularly, and always change them right after experiencing incontinence. General instructions  Take over-the-counter and prescription medicines only as told by your health care provider.  Use the bathroom about every 3-4 hours, even if you do not feel the need to urinate. Try to empty your bladder completely every time. After urinating, wait a minute. Then try to urinate again.  Make sure you are in a relaxed position while urinating.  If your incontinence is caused by nerve problems, keep a log of the medicines you take and the times you go to the bathroom.  Keep all follow-up visits as told by your health care provider. This is important. Contact a health care provider if:  You have pain that gets worse.  Your incontinence gets worse. Get help right away if:  You have a fever or chills.  You are unable to urinate.  You have redness in your groin area or down your legs. Summary  Urinary incontinence refers to a condition in which a person is unable to control where and when to pass  urine.  This condition may be caused by medicines, infection, weak bladder muscles, weak pelvic floor muscles, enlargement of the prostate (in men), or surgery.  The following factors increase your risk for developing this condition: older age, obesity, pregnancy and childbirth, menopause, neurological diseases, and chronic coughing.  There are several types of urinary incontinence. They include urge incontinence, stress incontinence, overflow incontinence, and functional incontinence.  This condition is usually treated first with lifestyle and behavioral changes, such as quitting smoking, eating a healthier diet, and doing regular pelvic floor exercises. Other treatment options include medicines, bulking agents, medical devices, electrical nerve stimulation, or surgery. This information is not intended to replace advice given to you by your health care provider. Make sure you discuss any questions you have with your health care provider. Document Revised: 08/24/2017 Document Reviewed: 11/23/2016 Elsevier Patient Education  [employer].

## 2020-04-09 NOTE — Progress Notes (Signed)
Patient ID: Kimberly [name], female    DOB: 03-08-1951, 69 y.o.   MRN: [id number]   Chief Complaint  Patient presents with  . Annual Exam   Subjective:    HPI AWV- Annual Wellness Visit  The patient was seen for their annual wellness visit. The patient's past medical history, surgical history, and family history were reviewed. Pertinent vaccines were reviewed ( tetanus, pneumonia, shingles, flu) The patient's medication list was reviewed and updated.  The height and weight were entered.  BMI recorded in electronic record elsewhere  Cognitive screening was completed. Outcome of Mini - Cog: PASS   Falls /depression screening electronically recorded within record elsewhere  Current tobacco usage: none (All patients who use tobacco were given written and verbal information on quitting)  Recent listing of emergency department/hospitalizations over the past year were reviewed.  current specialist the patient sees on a regular basis: Dr. Aline Brochure- ortho   Medicare annual wellness visit patient questionnaire was reviewed.  A written screening schedule for the patient for the next 5-10 years was given. Appropriate discussion of followup regarding next visit was discussed.  Has complaints of urinary incontinence. History of IBS and using probiotics and using this and his helping for past month. Not seen urology for that. Stress incontinence. Wearing pad daily. noticing over the past year. 4/6 strength of pad. History of hysterectomy 25 yrs ago.  DM2- BG around 160s in AM, even with the meds.  Fasting BG is elevated. But overall it's 8.2 for a1c in 12/20.  Recheck a1c today in office was 7.1.   Medical History [place] has a past medical history of Arthritis, Depression, Diabetes mellitus, High blood pressure, Hypothyroid, Obesity, and PONV (postoperative nausea and vomiting).   Outpatient Encounter Medications as of 04/09/2020  Medication Sig  . diclofenac Sodium  (VOLTAREN) 1 % GEL Apply topically 4 (four) times daily. OTC  . Probiotic Product (PROBIOTIC-10 PO) Take by mouth.  Marland Kitchen aspirin 81 MG tablet Take 81 mg by mouth daily.  . famotidine (PEPCID) 40 MG tablet   . gabapentin (NEURONTIN) 100 MG capsule Take 1 capsule (100 mg total) by mouth 3 (three) times daily.  Marland Kitchen glipiZIDE (GLUCOTROL XL) 5 MG 24 hr tablet Take 2 tablet by mouth every morning  . hydrochlorothiazide (HYDRODIURIL) 25 MG tablet Take 1 tablet (25 mg total) by mouth daily.  Marland Kitchen levothyroxine (SYNTHROID) 125 MCG tablet Take 1 tablet by mouth once daily before breakfast  . linaclotide (LINZESS) 290 MCG CAPS capsule Take 290 mcg by mouth daily before breakfast.  . losartan (COZAAR) 100 MG tablet Take 1 tablet (100 mg total) by mouth every morning.  . metFORMIN (GLUCOPHAGE) 500 MG tablet Take 1 tablet by mouth twice daily with a meal  . metoprolol succinate (TOPROL-XL) 100 MG 24 hr tablet Take 1 tablet by mouth at bedtime with or immediately following a meal.  . omeprazole (PRILOSEC) 20 MG capsule Take one capsule po qhs  . traMADol (ULTRAM) 50 MG tablet Two tablets po QHS prn pain (Patient not taking: Reported on 04/09/2020)  . venlafaxine XR (EFFEXOR-XR) 75 MG 24 hr capsule Take 1 capsule by mouth daily with breakfast  . [DISCONTINUED] famotidine (PEPCID) 40 MG tablet Take one tablet po each morning  . [DISCONTINUED] triamcinolone cream (KENALOG) 0.1 % Apply 1 application topically 2 (two) times daily.   Facility-Administered Encounter Medications as of 04/09/2020  Medication  . bupivacaine liposome (EXPAREL) 1.3 % injection 266 mg     Review of Systems  Constitutional: Negative for chills and fever.  HENT: Negative for congestion, rhinorrhea and sore throat.   Respiratory: Negative for cough, shortness of breath and wheezing.   Cardiovascular: Negative for chest pain and leg swelling.  Gastrointestinal: Negative for abdominal pain, diarrhea, nausea and vomiting.  Genitourinary: Negative  for dysuria and frequency.  Musculoskeletal: Negative for arthralgias and back pain.  Skin: Negative for rash.  Neurological: Negative for dizziness, weakness and headaches.     Vitals BP (!) 146/84   Pulse (!) 102   Temp 98.2 F (36.8 C)   Wt 207 lb 3.2 oz (94 kg)   SpO2 98%   BMI 36.70 kg/m   Objective:   Physical Exam Vitals and nursing note reviewed.  Constitutional:      Appearance: Normal appearance.  HENT:     Head: Normocephalic and atraumatic.     Nose: Nose normal.     Mouth/Throat:     Mouth: Mucous membranes are moist.     Pharynx: Oropharynx is clear.  Eyes:     Extraocular Movements: Extraocular movements intact.     Conjunctiva/sclera: Conjunctivae normal.     Pupils: Pupils are equal, round, and reactive to light.  Cardiovascular:     Rate and Rhythm: Regular rhythm. Tachycardia present.     Pulses: Normal pulses.     Heart sounds: Normal heart sounds.  Pulmonary:     Effort: Pulmonary effort is normal.     Breath sounds: Normal breath sounds. No wheezing, rhonchi or rales.  Musculoskeletal:        General: Normal range of motion.     Right lower leg: No edema.     Left lower leg: No edema.  Skin:    General: Skin is warm and dry.     Findings: No lesion or rash.  Neurological:     General: No focal deficit present.     Mental Status: She is alert and oriented to person, place, and time.  Psychiatric:        Mood and Affect: Mood normal.        Behavior: Behavior normal.      Assessment and Plan   1. Medicare annual wellness visit, subsequent  2. Urge incontinence of urine - Ambulatory referral to Urology  3. Essential hypertension, benign  4. Type 2 diabetes mellitus with diabetic nephropathy, without long-term current use of insulin (HCC) - CBC - CMP14+EGFR - Hemoglobin A1c - Microalbumin, urine  5. Hypothyroidism, unspecified type - TSH   htn- suboptimal.  Advising dec salt intake. Cont with losartan and  metoprolol Hypothyroidism- recheck labs. Cont synthroid. DM2- recheck labs. Cont glipizide, metformin. a1c improved.  Gave handout for incontinence. Seeing ortho for knee pain. Seen pt for lumbar stenosis. Labs ordered.   F/u [place] prn.

## 2020-04-19 DIAGNOSIS — E039 Hypothyroidism, unspecified: Secondary | ICD-10-CM | POA: Diagnosis not present

## 2020-04-19 DIAGNOSIS — E1121 Type 2 diabetes mellitus with diabetic nephropathy: Secondary | ICD-10-CM | POA: Diagnosis not present

## 2020-04-20 ENCOUNTER — Other Ambulatory Visit: Payer: Self-pay | Admitting: Family Medicine

## 2020-04-20 DIAGNOSIS — E1121 Type 2 diabetes mellitus with diabetic nephropathy: Secondary | ICD-10-CM

## 2020-04-20 DIAGNOSIS — Z79899 Other long term (current) drug therapy: Secondary | ICD-10-CM

## 2020-04-20 DIAGNOSIS — I10 Essential (primary) hypertension: Secondary | ICD-10-CM

## 2020-04-20 LAB — CBC
Hematocrit: 36.6 % (ref 34.0–46.6)
Hemoglobin: 12.3 g/dL (ref 11.1–15.9)
MCH: 32.2 pg (ref 26.6–33.0)
MCHC: 33.6 g/dL (ref 31.5–35.7)
MCV: 96 fL (ref 79–97)
Platelets: 336 10*3/uL (ref 150–450)
RBC: 3.82 x10E6/uL (ref 3.77–5.28)
RDW: 11.8 % (ref 11.7–15.4)
WBC: 9.8 10*3/uL (ref 3.4–10.8)

## 2020-04-20 LAB — CMP14+EGFR
ALT: 34 IU/L — ABNORMAL HIGH (ref 0–32)
AST: 27 IU/L (ref 0–40)
Albumin/Globulin Ratio: 1.5 (ref 1.2–2.2)
Albumin: 4.6 g/dL (ref 3.8–4.8)
Alkaline Phosphatase: 90 IU/L (ref 48–121)
BUN/Creatinine Ratio: 18 (ref 12–28)
BUN: 21 mg/dL (ref 8–27)
Bilirubin Total: 0.3 mg/dL (ref 0.0–1.2)
CO2: 24 mmol/L (ref 20–29)
Calcium: 9.9 mg/dL (ref 8.7–10.3)
Chloride: 99 mmol/L (ref 96–106)
Creatinine, Ser: 1.15 mg/dL — ABNORMAL HIGH (ref 0.57–1.00)
GFR calc Af Amer: 56 mL/min/{1.73_m2} — ABNORMAL LOW (ref >59)
GFR calc non Af Amer: 49 mL/min/{1.73_m2} — ABNORMAL LOW (ref >59)
Globulin, Total: 3 g/dL (ref 1.5–4.5)
Glucose: 184 mg/dL — ABNORMAL HIGH (ref 65–99)
Potassium: 4.4 mmol/L (ref 3.5–5.2)
Sodium: 138 mmol/L (ref 134–144)
Total Protein: 7.6 g/dL (ref 6.0–8.5)

## 2020-04-20 LAB — HEMOGLOBIN A1C
Est. average glucose Bld gHb Est-mCnc: 194 mg/dL
Hgb A1c MFr Bld: 8.4 % — ABNORMAL HIGH (ref 4.8–5.6)

## 2020-04-20 LAB — MICROALBUMIN, URINE: Microalbumin, Urine: 116.1 ug/mL

## 2020-04-20 LAB — TSH: TSH: 1.49 u[IU]/mL (ref 0.450–4.500)

## 2020-05-07 ENCOUNTER — Ambulatory Visit (INDEPENDENT_AMBULATORY_CARE_PROVIDER_SITE_OTHER): Payer: Medicare Other | Admitting: Urology

## 2020-05-07 ENCOUNTER — Encounter: Payer: Self-pay | Admitting: Urology

## 2020-05-07 ENCOUNTER — Other Ambulatory Visit: Payer: Self-pay

## 2020-05-07 VITALS — BP 164/80 | HR 66 | Temp 97.6°F | Ht 63.0 in | Wt 207.2 lb

## 2020-05-07 DIAGNOSIS — R32 Unspecified urinary incontinence: Secondary | ICD-10-CM | POA: Diagnosis not present

## 2020-05-07 LAB — POCT URINALYSIS DIPSTICK
Bilirubin, UA: NEGATIVE
Blood, UA: NEGATIVE
Glucose, UA: NEGATIVE
Ketones, UA: NEGATIVE
Leukocytes, UA: NEGATIVE
Nitrite, UA: NEGATIVE
Protein, UA: POSITIVE — AB
Spec Grav, UA: 1.02 (ref 1.010–1.025)
Urobilinogen, UA: 0.2 E.U./dL
pH, UA: 7 (ref 5.0–8.0)

## 2020-05-07 LAB — BLADDER SCAN AMB NON-IMAGING: Scan Result: 14

## 2020-05-07 MED ORDER — MIRABEGRON ER 25 MG PO TB24
25.0000 mg | ORAL_TABLET | Freq: Every day | ORAL | 0 refills | Status: DC
Start: 2020-05-07 — End: 2020-06-11

## 2020-05-07 NOTE — Patient Instructions (Signed)

## 2020-05-07 NOTE — Progress Notes (Signed)
05/07/2020 10:30 AM   Kimberly [name] 06-Jun-1951 [id number]  Referring provider: Erven Colla, DO [place],   [id number]  Urinary incontinence  HPI: Kimberly [name] is a 63ZH here for evaluation of urinary incontinence. Starting 1 year ago she developed worsening urgency, frequency, nocturia and urge incontinence. She denies incomplete emptying. PVR 14cc. No numbness/tingling in fingers/toes. She uses 5+ pads per day and 3 of them are soaked. She has mild SUI. Her biggest complaint is urge incontinence.  She has IBS and takes linzess prn  She has DMII and A1c is 8.4.    PMH: Past Medical History:  Diagnosis Date  . Anxiety   . Arthritis   . Depression   . Diabetes mellitus   . GERD (gastroesophageal reflux disease)   . High blood pressure   . Hypothyroid   . Obesity   . PONV (postoperative nausea and vomiting)     Surgical History: Past Surgical History:  Procedure Laterality Date  . ABDOMINAL HYSTERECTOMY    . Bunions Right   . CARPAL TUNNEL RELEASE Right   . KNEE ARTHROSCOPY Right   . THYROID SURGERY     removal  . TOTAL KNEE ARTHROPLASTY Right 08/25/2013   Procedure: TOTAL KNEE ARTHROPLASTY;  Surgeon: Carole Civil, MD;  Location: [facility];  Service: Orthopedics;  Laterality: Right;  . TOTAL KNEE ARTHROPLASTY Left 09/09/2014   Procedure: LEFT TOTAL KNEE ARTHROPLASTY;  Surgeon: Carole Civil, MD;  Location: [facility];  Service: Orthopedics;  Laterality: Left;    Home Medications:  Allergies as of 05/07/2020      Reactions   Ace Inhibitors Cough      Medication List       Accurate as of May 07, 2020 10:30 AM. If you have any questions, ask your nurse or doctor.        aspirin 81 MG tablet Take 81 mg by mouth daily.   diclofenac Sodium 1 % Gel Commonly known as: VOLTAREN Apply topically 4 (four) times daily. OTC   famotidine 40 MG tablet Commonly known as: PEPCID   gabapentin 100 MG capsule Commonly known as:  NEURONTIN Take 1 capsule (100 mg total) by mouth 3 (three) times daily.   glipiZIDE 5 MG 24 hr tablet Commonly known as: GLUCOTROL XL Take 2 tablet by mouth every morning   hydrochlorothiazide 25 MG tablet Commonly known as: HYDRODIURIL Take 1 tablet (25 mg total) by mouth daily.   levothyroxine 125 MCG tablet Commonly known as: SYNTHROID Take 1 tablet by mouth once daily before breakfast   Linzess 290 MCG Caps capsule Generic drug: linaclotide Take 290 mcg by mouth daily before breakfast.   losartan 100 MG tablet Commonly known as: COZAAR Take 1 tablet (100 mg total) by mouth every morning.   metFORMIN 500 MG tablet Commonly known as: GLUCOPHAGE Take 1 tablet by mouth twice daily with a meal   metoprolol succinate 100 MG 24 hr tablet Commonly known as: TOPROL-XL Take 1 tablet by mouth at bedtime with or immediately following a meal.   omeprazole 20 MG capsule Commonly known as: PRILOSEC Take one capsule po qhs   PROBIOTIC-10 PO Take by mouth.   traMADol 50 MG tablet Commonly known as: ULTRAM Two tablets po QHS prn pain   venlafaxine XR 75 MG 24 hr capsule Commonly known as: EFFEXOR-XR Take 1 capsule by mouth daily with breakfast       Allergies:  Allergies  Allergen Reactions  . Ace Inhibitors Cough  Family History: Family History  Problem Relation Age of Onset  . Congestive Heart Failure Father   . Hypertension Father   . Diabetes Mother   . Hypertension Mother   . Hypertension Brother   . Cancer Other   . Diabetes Other     Social History:  reports that she has never smoked. She has never used smokeless tobacco. She reports that she does not drink alcohol and does not use drugs.  ROS: All other review of systems were reviewed and are negative except what is noted above in HPI  Physical Exam: BP (!) 164/80   Pulse 66   Temp 97.6 F (36.4 C)   Ht 5\' 3"  (1.6 m)   Wt 207 lb 3.2 oz (94 kg)   BMI 36.70 kg/m   Constitutional:  Alert and  oriented, No acute distress. HEENT: [place] AT, moist mucus membranes.  Trachea midline, no masses. Cardiovascular: No clubbing, cyanosis, or edema. Respiratory: Normal respiratory effort, no increased work of breathing. GI: Abdomen is soft, nontender, nondistended, no abdominal masses GU: No CVA tenderness.  Lymph: No cervical or inguinal lymphadenopathy. Skin: No rashes, bruises or suspicious lesions. Neurologic: Grossly intact, no focal deficits, moving all 4 extremities. Psychiatric: Normal mood and affect.  Laboratory Data: Lab Results  Component Value Date   WBC 9.8 04/19/2020   HGB 12.3 04/19/2020   HCT 36.6 04/19/2020   MCV 96 04/19/2020   PLT 336 04/19/2020    Lab Results  Component Value Date   CREATININE 1.15 (H) 04/19/2020    No results found for: PSA  No results found for: TESTOSTERONE  Lab Results  Component Value Date   HGBA1C 8.4 (H) 04/19/2020    Urinalysis    Component Value Date/Time   BILIRUBINUR negative 05/07/2020 1000   PROTEINUR Positive (A) 05/07/2020 1000   UROBILINOGEN 0.2 05/07/2020 1000   NITRITE negative 05/07/2020 1000   LEUKOCYTESUR Negative 05/07/2020 1000    Lab Results  Component Value Date   LABMICR 116.1 04/19/2020    Pertinent Imaging:  No results found for this or any previous visit.  No results found for this or any previous visit.  No results found for this or any previous visit.  No results found for this or any previous visit.  Results for orders placed during the hospital encounter of 07/02/18  US RENAL  Narrative CLINICAL DATA:  Chronic renal disease.  EXAM: RENAL / URINARY TRACT ULTRASOUND COMPLETE  COMPARISON:  None.  FINDINGS: Right Kidney:  Renal measurements: 11.3 x 5.0 x 5.5 cm = volume: 161 mL . Echogenicity within normal limits. No mass or hydronephrosis visualized.  Left Kidney:  Renal measurements: 11.1 x 4.4 x 5.8 cm = volume: 147 mL. Echogenicity within normal limits. No mass or  hydronephrosis visualized.  Bladder:  Appears normal for degree of bladder distention.  IMPRESSION: Negative exam.   Electronically Signed By: Inge Rise M.D. On: 07/02/2018 16:13  No results found for this or any previous visit.  No results found for this or any previous visit.  No results found for this or any previous visit.   Assessment & Plan:    1. Urinary incontinence, unspecified type -We will trial mirabegron 25mg   - BLADDER SCAN AMB NON-IMAGING - POCT urinalysis dipstick   No follow-ups on file.  Nicolette Bang, MD  [facility] [place]

## 2020-05-07 NOTE — Progress Notes (Signed)
Urological Symptom Review  Patient is experiencing the following symptoms: Frequent urination Hard to postpone urination Get up at night to urinate Leakage of urine   Review of Systems  Gastrointestinal (upper)  : Negative for upper GI symptoms  Gastrointestinal (lower) : Diarrhea  Constitutional : Night Sweats  Skin: Negative for skin symptoms  Eyes: Negative for eye symptoms  Ear/Nose/Throat : Negative for Ear/Nose/Throat symptoms  Hematologic/Lymphatic: Negative for Hematologic/Lymphatic symptoms  Cardiovascular : Negative for cardiovascular symptoms  Respiratory : Cough  Endocrine: Negative for endocrine symptoms  Musculoskeletal: Negative for musculoskeletal symptoms  Neurological: Negative for neurological symptoms  Psychologic: Depression Anxiety

## 2020-06-03 ENCOUNTER — Encounter: Payer: Self-pay | Admitting: Urology

## 2020-06-03 ENCOUNTER — Ambulatory Visit (INDEPENDENT_AMBULATORY_CARE_PROVIDER_SITE_OTHER): Payer: Medicare Other | Admitting: Urology

## 2020-06-03 ENCOUNTER — Other Ambulatory Visit: Payer: Self-pay

## 2020-06-03 VITALS — BP 162/81 | HR 71 | Temp 98.4°F | Ht 63.0 in | Wt 207.0 lb

## 2020-06-03 DIAGNOSIS — R32 Unspecified urinary incontinence: Secondary | ICD-10-CM

## 2020-06-03 LAB — URINALYSIS, ROUTINE W REFLEX MICROSCOPIC
Bilirubin, UA: NEGATIVE
Glucose, UA: NEGATIVE
Ketones, UA: NEGATIVE
Leukocytes,UA: NEGATIVE
Nitrite, UA: NEGATIVE
RBC, UA: NEGATIVE
Specific Gravity, UA: 1.02 (ref 1.005–1.030)
Urobilinogen, Ur: 1 mg/dL (ref 0.2–1.0)
pH, UA: 6.5 (ref 5.0–7.5)

## 2020-06-03 LAB — BLADDER SCAN AMB NON-IMAGING: Scan Result: 26

## 2020-06-03 MED ORDER — FESOTERODINE FUMARATE ER 8 MG PO TB24
8.0000 mg | ORAL_TABLET | Freq: Every day | ORAL | 0 refills | Status: DC
Start: 2020-06-03 — End: 2020-06-11

## 2020-06-03 NOTE — Progress Notes (Signed)
06/03/2020 10:00 AM   Kimberly [name] 1950-09-03 [id number]  Referring provider: Erven Colla, DO [place],  [place] [id number]  Urge incontinence  HPI: Kimberly [name] is a 3IO here for folllowup for urge incontinence. The mirabegron 25mg  improved her urge incontinence 98% but it is $400 and she cannot afford the medication. She has chronic diarrhea. She does not have glaucoma.    PMH: Past Medical History:  Diagnosis Date  . Anxiety   . Arthritis   . Depression   . Diabetes mellitus   . GERD (gastroesophageal reflux disease)   . High blood pressure   . Hypothyroid   . Obesity   . PONV (postoperative nausea and vomiting)     Surgical History: Past Surgical History:  Procedure Laterality Date  . ABDOMINAL HYSTERECTOMY    . Bunions Right   . CARPAL TUNNEL RELEASE Right   . KNEE ARTHROSCOPY Right   . THYROID SURGERY     removal  . TOTAL KNEE ARTHROPLASTY Right 08/25/2013   Procedure: TOTAL KNEE ARTHROPLASTY;  Surgeon: Carole Civil, MD;  Location: [facility];  Service: Orthopedics;  Laterality: Right;  . TOTAL KNEE ARTHROPLASTY Left 09/09/2014   Procedure: LEFT TOTAL KNEE ARTHROPLASTY;  Surgeon: Carole Civil, MD;  Location: [facility];  Service: Orthopedics;  Laterality: Left;    Home Medications:  Allergies as of 06/03/2020      Reactions   Ace Inhibitors Cough      Medication List       Accurate as of June 03, 2020 10:00 AM. If you have any questions, ask your nurse or doctor.        aspirin 81 MG tablet Take 81 mg by mouth daily.   diclofenac Sodium 1 % Gel Commonly known as: VOLTAREN Apply topically 4 (four) times daily. OTC   famotidine 40 MG tablet Commonly known as: PEPCID   gabapentin 100 MG capsule Commonly known as: NEURONTIN Take 1 capsule (100 mg total) by mouth 3 (three) times daily.   glipiZIDE 5 MG 24 hr tablet Commonly known as: GLUCOTROL XL Take 2 tablet by mouth every morning   hydrochlorothiazide 25 MG  tablet Commonly known as: HYDRODIURIL Take 1 tablet (25 mg total) by mouth daily.   levothyroxine 125 MCG tablet Commonly known as: SYNTHROID Take 1 tablet by mouth once daily before breakfast   Linzess 290 MCG Caps capsule Generic drug: linaclotide Take 290 mcg by mouth daily before breakfast.   losartan 100 MG tablet Commonly known as: COZAAR Take 1 tablet (100 mg total) by mouth every morning.   metFORMIN 500 MG tablet Commonly known as: GLUCOPHAGE Take 1 tablet by mouth twice daily with a meal   metoprolol succinate 100 MG 24 hr tablet Commonly known as: TOPROL-XL Take 1 tablet by mouth at bedtime with or immediately following a meal.   mirabegron ER 25 MG Tb24 tablet Commonly known as: MYRBETRIQ Take 1 tablet (25 mg total) by mouth daily.   omeprazole 20 MG capsule Commonly known as: PRILOSEC Take one capsule po qhs   PROBIOTIC-10 PO Take by mouth.   traMADol 50 MG tablet Commonly known as: ULTRAM Two tablets po QHS prn pain   venlafaxine XR 75 MG 24 hr capsule Commonly known as: EFFEXOR-XR Take 1 capsule by mouth daily with breakfast       Allergies:  Allergies  Allergen Reactions  . Ace Inhibitors Cough    Family History: Family History  Problem Relation Age of Onset  .  Congestive Heart Failure Father   . Hypertension Father   . Diabetes Mother   . Hypertension Mother   . Hypertension Brother   . Cancer Other   . Diabetes Other     Social History:  reports that she has never smoked. She has never used smokeless tobacco. She reports that she does not drink alcohol and does not use drugs.  ROS: All other review of systems were reviewed and are negative except what is noted above in HPI  Physical Exam: BP (!) 162/81   Pulse 71   Temp 98.4 F (36.9 C)   Ht 5\' 3"  (1.6 m)   Wt 207 lb (93.9 kg)   BMI 36.67 kg/m   Constitutional:  Alert and oriented, No acute distress. HEENT: [place] AT, moist mucus membranes.  Trachea midline, no  masses. Cardiovascular: No clubbing, cyanosis, or edema. Respiratory: Normal respiratory effort, no increased work of breathing. GI: Abdomen is soft, nontender, nondistended, no abdominal masses GU: No CVA tenderness.  Lymph: No cervical or inguinal lymphadenopathy. Skin: No rashes, bruises or suspicious lesions. Neurologic: Grossly intact, no focal deficits, moving all 4 extremities. Psychiatric: Normal mood and affect.  Laboratory Data: Lab Results  Component Value Date   WBC 9.8 04/19/2020   HGB 12.3 04/19/2020   HCT 36.6 04/19/2020   MCV 96 04/19/2020   PLT 336 04/19/2020    Lab Results  Component Value Date   CREATININE 1.15 (H) 04/19/2020    No results found for: PSA  No results found for: TESTOSTERONE  Lab Results  Component Value Date   HGBA1C 8.4 (H) 04/19/2020    Urinalysis    Component Value Date/Time   BILIRUBINUR negative 05/07/2020 1000   PROTEINUR Positive (A) 05/07/2020 1000   UROBILINOGEN 0.2 05/07/2020 1000   NITRITE negative 05/07/2020 1000   LEUKOCYTESUR Negative 05/07/2020 1000    Lab Results  Component Value Date   LABMICR 116.1 04/19/2020    Pertinent Imaging:  No results found for this or any previous visit.  No results found for this or any previous visit.  No results found for this or any previous visit.  No results found for this or any previous visit.  Results for orders placed during the hospital encounter of 07/02/18  US RENAL  Narrative CLINICAL DATA:  Chronic renal disease.  EXAM: RENAL / URINARY TRACT ULTRASOUND COMPLETE  COMPARISON:  None.  FINDINGS: Right Kidney:  Renal measurements: 11.3 x 5.0 x 5.5 cm = volume: 161 mL . Echogenicity within normal limits. No mass or hydronephrosis visualized.  Left Kidney:  Renal measurements: 11.1 x 4.4 x 5.8 cm = volume: 147 mL. Echogenicity within normal limits. No mass or hydronephrosis visualized.  Bladder:  Appears normal for degree of bladder  distention.  IMPRESSION: Negative exam.   Electronically Signed By: Inge Rise M.D. On: 07/02/2018 16:13  No results found for this or any previous visit.  No results found for this or any previous visit.  No results found for this or any previous visit.   Assessment & Plan:    1. Urinary incontinence, unspecified type -We will trial toviaz 8mg  daily. RTC 3 months - Urinalysis, Routine w reflex microscopic - Bladder Scan (Post Void Residual) in office   No follow-ups on file.  Nicolette Bang, MD  [facility] [place]

## 2020-06-03 NOTE — Progress Notes (Signed)
Urological Symptom Review  Patient is experiencing the following symptoms: Get up at night to urinate Leakage of urine   Review of Systems  Gastrointestinal (upper)  : Negative for upper GI symptoms  Gastrointestinal (lower) : Negative for lower GI symptoms  Constitutional : Night Sweats  Skin: Negative for skin symptoms  Eyes: Negative for eye symptoms  Ear/Nose/Throat : Negative for Ear/Nose/Throat symptoms  Hematologic/Lymphatic: Negative for Hematologic/Lymphatic symptoms  Cardiovascular : Negative for cardiovascular symptoms  Respiratory : Cough  Endocrine: Negative for endocrine symptoms  Musculoskeletal: Back pain  Neurological: Negative for neurological symptoms  Psychologic: Anxiety

## 2020-06-10 ENCOUNTER — Telehealth: Payer: Self-pay | Admitting: Urology

## 2020-06-10 NOTE — Telephone Encounter (Signed)
PT has question about medication. Wants a nurse to call her.

## 2020-06-11 ENCOUNTER — Other Ambulatory Visit: Payer: Self-pay

## 2020-06-11 ENCOUNTER — Telehealth: Payer: Self-pay

## 2020-06-11 DIAGNOSIS — R32 Unspecified urinary incontinence: Secondary | ICD-10-CM

## 2020-06-11 MED ORDER — MIRABEGRON ER 25 MG PO TB24: 25.0000 mg | ORAL_TABLET | Freq: Every day | ORAL | 3 refills | Status: DC

## 2020-06-11 NOTE — Telephone Encounter (Signed)
If the mirabegron worked well just send her in the rx for the mirabegron

## 2020-06-11 NOTE — Telephone Encounter (Signed)
Tried to call pt. She was coming to pick up samples of Toviaz. Lft mes. Rx sent for Mirabegron.

## 2020-06-11 NOTE — Telephone Encounter (Signed)
Pt called back abd I explained I was sending in a rx for Mirabegron to [facility] because it is the generic for Myrbetriq. Dr. Alyson Ingles gave permission to stay on this.

## 2020-06-11 NOTE — Telephone Encounter (Signed)
Rx sent 

## 2020-06-14 ENCOUNTER — Other Ambulatory Visit: Payer: Self-pay | Admitting: *Deleted

## 2020-06-14 MED ORDER — GLIPIZIDE ER 5 MG PO TB24: ORAL_TABLET | ORAL | 0 refills | Status: DC

## 2020-06-15 ENCOUNTER — Other Ambulatory Visit: Payer: Self-pay

## 2020-06-15 ENCOUNTER — Telehealth: Payer: Self-pay

## 2020-06-15 MED ORDER — TOLTERODINE TARTRATE ER 4 MG PO CP24: 4.0000 mg | ORAL_CAPSULE | Freq: Every day | ORAL | 2 refills | Status: DC

## 2020-06-15 NOTE — Telephone Encounter (Signed)
Called pt and left message to call back, to tell her that I had called her insurance about the Myrbetriq rx sent in and found out they would not pay for med . Also that we had sent in new rx they said would be covered as a generic.

## 2020-06-16 NOTE — Telephone Encounter (Signed)
Pt called back and made aware of new rx sent to [facility] that is  Supposed to be in $10 formulary per her ins.

## 2020-06-16 NOTE — Telephone Encounter (Signed)
-----   Message from Cleon Gustin, MD sent at 06/15/2020  8:32 AM EDT ----- Detrol 4mg  ----- Message ----- From: Valentina Lucks, LPN Sent: [id number]   1:40 PM EDT To: Cleon Gustin, MD  I called pts ins. They will not pay a reasonable price for pts Myrbetriq nor Toviaz which you told her to try. Pt was wanting something in formulary. I called ins. They will take Oxybutinin or Tolteridine. Will either of these be ok for her to try?

## 2020-06-20 DIAGNOSIS — J069 Acute upper respiratory infection, unspecified: Secondary | ICD-10-CM | POA: Diagnosis not present

## 2020-06-20 DIAGNOSIS — Z20822 Contact with and (suspected) exposure to covid-19: Secondary | ICD-10-CM | POA: Diagnosis not present

## 2020-06-22 ENCOUNTER — Telehealth: Payer: Self-pay

## 2020-06-22 NOTE — Telephone Encounter (Signed)
-----   Message from Cleon Gustin, MD sent at 06/15/2020  8:32 AM EDT ----- Detrol 4mg  ----- Message ----- From: Valentina Lucks, LPN Sent: [id number]   1:40 PM EDT To: Cleon Gustin, MD  I called pts ins. They will not pay a reasonable price for pts Myrbetriq nor Toviaz which you told her to try. Pt was wanting something in formulary. I called ins. They will take Oxybutinin or Tolteridine. Will either of these be ok for her to try?

## 2020-06-22 NOTE — Telephone Encounter (Signed)
Pt was notified prior of Detrol rx.

## 2020-07-12 ENCOUNTER — Other Ambulatory Visit: Payer: Self-pay

## 2020-07-12 ENCOUNTER — Ambulatory Visit (INDEPENDENT_AMBULATORY_CARE_PROVIDER_SITE_OTHER): Payer: Medicare Other | Admitting: Family Medicine

## 2020-07-12 VITALS — BP 132/88 | HR 64 | Temp 96.4°F | Ht 63.0 in | Wt 206.0 lb

## 2020-07-12 DIAGNOSIS — E039 Hypothyroidism, unspecified: Secondary | ICD-10-CM

## 2020-07-12 DIAGNOSIS — Z23 Encounter for immunization: Secondary | ICD-10-CM

## 2020-07-12 DIAGNOSIS — E1121 Type 2 diabetes mellitus with diabetic nephropathy: Secondary | ICD-10-CM

## 2020-07-12 DIAGNOSIS — I10 Essential (primary) hypertension: Secondary | ICD-10-CM

## 2020-07-12 NOTE — Progress Notes (Signed)
Patient ID: Kimberly [name], female    DOB: [birth year], 69 y.o.   MRN: [id number]   Chief Complaint  Patient presents with  . Diabetes  . Hypertension   Subjective:    HPI Pt here for follow up on DM, HTN, and thyroid disease.  Pt not checking sugars often. Pt states she knows how she feels. Pt states has been having headaches but believes that is due to reading in small print.   Pt not checking BG regularly. Pt ate something high carb sweets yesterday.  Has eye doctor, had exam this past summer, no retinopathy per pt. No foot ulcers or sores. But feet hurt at times due to OA.  Used to see nephrology in past. Not seen in past year.  Has chronic back pain, taking gabapentin and tramadol and helping. Pt wanting to get back to walking some.  Seeing urology- h/o incontinence. Pt was on samples in past for detrol and myrbetriq, they are going to try a 3rd one.  Pt is going to let them know which medication   HTN Pt compliant with BP meds.  No SEs Denies chest pain, sob, LE swelling, or blurry vision.   Medical History [place] has a past medical history of Anxiety, Arthritis, Depression, Diabetes mellitus, GERD (gastroesophageal reflux disease), High blood pressure, Hypothyroid, Obesity, and PONV (postoperative nausea and vomiting).   Outpatient Encounter Medications as of 07/12/2020  Medication Sig  . aspirin 81 MG tablet Take 81 mg by mouth daily.  . diclofenac Sodium (VOLTAREN) 1 % GEL Apply topically 4 (four) times daily. OTC  . famotidine (PEPCID) 40 MG tablet   . glipiZIDE (GLUCOTROL XL) 5 MG 24 hr tablet Take 2 tablet by mouth every morning  . hydrochlorothiazide (HYDRODIURIL) 25 MG tablet Take 1 tablet (25 mg total) by mouth daily.  Marland Kitchen levothyroxine (SYNTHROID) 125 MCG tablet Take 1 tablet by mouth once daily before breakfast  . linaclotide (LINZESS) 290 MCG CAPS capsule Take 290 mcg by mouth daily before breakfast.  . losartan (COZAAR) 100 MG tablet Take 1  tablet (100 mg total) by mouth every morning.  . metFORMIN (GLUCOPHAGE) 500 MG tablet Take 2 tablets (1,000 mg total) by mouth 2 (two) times daily with a meal. Take 1 tablet by mouth twice daily with a meal  . metoprolol succinate (TOPROL-XL) 100 MG 24 hr tablet Take 1 tablet by mouth at bedtime with or immediately following a meal.  . omeprazole (PRILOSEC) 20 MG capsule Take one capsule po qhs  . Probiotic Product (PROBIOTIC-10 PO) Take by mouth.  . tolterodine (DETROL LA) 4 MG 24 hr capsule Take 1 capsule (4 mg total) by mouth daily.  . traMADol (ULTRAM) 50 MG tablet Two tablets po QHS prn pain  . venlafaxine XR (EFFEXOR-XR) 75 MG 24 hr capsule Take 1 capsule by mouth daily with breakfast  . [DISCONTINUED] metFORMIN (GLUCOPHAGE) 500 MG tablet Take 1 tablet by mouth twice daily with a meal  . gabapentin (NEURONTIN) 100 MG capsule Take 1 capsule (100 mg total) by mouth 3 (three) times daily.  . mirabegron ER (MYRBETRIQ) 25 MG TB24 tablet Take 1 tablet (25 mg total) by mouth daily. (Patient not taking: Reported on 07/12/2020)   Facility-Administered Encounter Medications as of 07/12/2020  Medication  . bupivacaine liposome (EXPAREL) 1.3 % injection 266 mg     Review of Systems  Constitutional: Negative for chills and fever.  HENT: Positive for congestion. Negative for rhinorrhea and sore throat.   Respiratory: Negative  for cough, shortness of breath and wheezing.   Cardiovascular: Negative for chest pain and leg swelling.  Gastrointestinal: Negative for abdominal pain, diarrhea, nausea and vomiting.  Genitourinary: Negative for dysuria and frequency.  Musculoskeletal: Negative for arthralgias and back pain.  Skin: Negative for rash.  Neurological: Positive for headaches. Negative for dizziness and weakness.     Vitals BP 132/88   Pulse 64   Temp (!) 96.4 F (35.8 C)   Ht _0  (1.6 m)   Wt 206 lb (93.4 kg) Comment: pt reported  SpO2 96%   BMI 36.49 kg/m   Objective:    Physical Exam Vitals and nursing note reviewed.  Constitutional:      Appearance: Normal appearance.  HENT:     Head: Normocephalic and atraumatic.     Nose: Nose normal.     Mouth/Throat:     Mouth: Mucous membranes are moist.     Pharynx: Oropharynx is clear.  Eyes:     Extraocular Movements: Extraocular movements intact.     Conjunctiva/sclera: Conjunctivae normal.     Pupils: Pupils are equal, round, and reactive to light.  Cardiovascular:     Rate and Rhythm: Normal rate and regular rhythm.     Pulses: Normal pulses.     Heart sounds: Normal heart sounds.  Pulmonary:     Effort: Pulmonary effort is normal.     Breath sounds: Normal breath sounds. No wheezing, rhonchi or rales.  Musculoskeletal:        General: Normal range of motion.     Right lower leg: No edema.     Left lower leg: No edema.  Skin:    General: Skin is warm and dry.     Findings: No lesion or rash.  Neurological:     General: No focal deficit present.     Mental Status: She is alert and oriented to person, place, and time.  Psychiatric:        Mood and Affect: Mood normal.        Behavior: Behavior normal.      Assessment and Plan   1. Type 2 diabetes mellitus with diabetic nephropathy, without long-term current use of insulin (HCC) - Hemoglobin A1c - CMP14+EGFR - Microalbumin, urine - metFORMIN (GLUCOPHAGE) 500 MG tablet; Take 2 tablets (1,000 mg total) by mouth 2 (two) times daily with a meal. Take 1 tablet by mouth twice daily with a meal  Dispense: 160 tablet; Refill: 1  2. Need for vaccination - Flu Vaccine QUAD High Dose(Fluad)  3. Hypothyroidism, unspecified type  4. Essential hypertension, benign   DM2- will check labs today. Will cont meds. Last a1c 8.4, in 04/19/20. Ave glucose 194. Cr 1.15. Pt taking metfromin 52m bid and glipize XL 15mdaily.  May need to go up on metformin if a1c is still elevated.    Hm-Flu vaccine wanted today.  Pt to f/u with urology about  bladder medication.  Hypothyroidism- tsh 1.49, stable. will cont meds.  htn- stable. Suboptimal.  Watch salt intake and recheck next visit.  Cont meds.  F/u 41m77mo prn.

## 2020-07-13 ENCOUNTER — Other Ambulatory Visit: Payer: Self-pay | Admitting: *Deleted

## 2020-07-13 DIAGNOSIS — E1121 Type 2 diabetes mellitus with diabetic nephropathy: Secondary | ICD-10-CM

## 2020-07-13 DIAGNOSIS — Z79899 Other long term (current) drug therapy: Secondary | ICD-10-CM

## 2020-07-13 DIAGNOSIS — Z13220 Encounter for screening for lipoid disorders: Secondary | ICD-10-CM

## 2020-07-13 DIAGNOSIS — I10 Essential (primary) hypertension: Secondary | ICD-10-CM

## 2020-07-13 LAB — CMP14+EGFR
ALT: 40 IU/L — ABNORMAL HIGH (ref 0–32)
AST: 27 IU/L (ref 0–40)
Albumin/Globulin Ratio: 1.7 (ref 1.2–2.2)
Albumin: 4.8 g/dL (ref 3.8–4.8)
Alkaline Phosphatase: 99 IU/L (ref 44–121)
BUN/Creatinine Ratio: 18 (ref 12–28)
BUN: 16 mg/dL (ref 8–27)
Bilirubin Total: 0.3 mg/dL (ref 0.0–1.2)
CO2: 28 mmol/L (ref 20–29)
Calcium: 9.8 mg/dL (ref 8.7–10.3)
Chloride: 99 mmol/L (ref 96–106)
Creatinine, Ser: 0.9 mg/dL (ref 0.57–1.00)
GFR calc Af Amer: 76 mL/min/{1.73_m2} (ref >59)
GFR calc non Af Amer: 66 mL/min/{1.73_m2} (ref >59)
Globulin, Total: 2.9 g/dL (ref 1.5–4.5)
Glucose: 146 mg/dL — ABNORMAL HIGH (ref 65–99)
Potassium: 4.3 mmol/L (ref 3.5–5.2)
Sodium: 139 mmol/L (ref 134–144)
Total Protein: 7.7 g/dL (ref 6.0–8.5)

## 2020-07-13 LAB — MICROALBUMIN, URINE: Microalbumin, Urine: 63 ug/mL

## 2020-07-13 LAB — HEMOGLOBIN A1C
Est. average glucose Bld gHb Est-mCnc: 209 mg/dL
Hgb A1c MFr Bld: 8.9 % — ABNORMAL HIGH (ref 4.8–5.6)

## 2020-07-13 MED ORDER — METFORMIN HCL 500 MG PO TABS: 1000.0000 mg | ORAL_TABLET | Freq: Two times a day (BID) | ORAL | 1 refills | Status: DC

## 2020-07-21 ENCOUNTER — Other Ambulatory Visit: Payer: Self-pay | Admitting: *Deleted

## 2020-07-21 ENCOUNTER — Telehealth: Payer: Self-pay | Admitting: *Deleted

## 2020-07-21 DIAGNOSIS — E1121 Type 2 diabetes mellitus with diabetic nephropathy: Secondary | ICD-10-CM

## 2020-07-21 MED ORDER — METFORMIN HCL 500 MG PO TABS: ORAL_TABLET | ORAL | 1 refills | Status: DC

## 2020-07-21 NOTE — Telephone Encounter (Signed)
Script sent to pharm with new directions

## 2020-07-21 NOTE — Telephone Encounter (Signed)
[facility] on metformin. Script sent in with two directions 500mg  2 bid and one bid. Note states she is taking one bid.  is it Ok to send in with directions one bid?

## 2020-07-21 NOTE — Telephone Encounter (Signed)
It is new script to increase to metformin 500mg  take 2 tab p.o. bid with meals.   Thx.   Dr. Lovena Le

## 2020-08-02 DIAGNOSIS — Z23 Encounter for immunization: Secondary | ICD-10-CM | POA: Diagnosis not present

## 2020-08-31 ENCOUNTER — Other Ambulatory Visit: Payer: Self-pay | Admitting: *Deleted

## 2020-08-31 MED ORDER — LOSARTAN POTASSIUM 100 MG PO TABS: 100.0000 mg | ORAL_TABLET | Freq: Every morning | ORAL | 0 refills | Status: DC

## 2020-09-01 ENCOUNTER — Other Ambulatory Visit: Payer: Self-pay | Admitting: *Deleted

## 2020-09-01 MED ORDER — VENLAFAXINE HCL ER 75 MG PO CP24: ORAL_CAPSULE | ORAL | 0 refills | Status: DC

## 2020-09-01 MED ORDER — LOSARTAN POTASSIUM 100 MG PO TABS: 100.0000 mg | ORAL_TABLET | Freq: Every morning | ORAL | 0 refills | Status: DC

## 2020-09-03 ENCOUNTER — Ambulatory Visit: Payer: Medicare Other | Admitting: Urology

## 2020-09-10 ENCOUNTER — Ambulatory Visit (INDEPENDENT_AMBULATORY_CARE_PROVIDER_SITE_OTHER): Payer: Medicare Other | Admitting: Urology

## 2020-09-10 ENCOUNTER — Encounter: Payer: Self-pay | Admitting: Urology

## 2020-09-10 ENCOUNTER — Other Ambulatory Visit: Payer: Self-pay

## 2020-09-10 VITALS — BP 183/74 | HR 85 | Temp 98.8°F | Ht 63.0 in | Wt 206.0 lb

## 2020-09-10 DIAGNOSIS — R32 Unspecified urinary incontinence: Secondary | ICD-10-CM | POA: Diagnosis not present

## 2020-09-10 LAB — URINALYSIS, ROUTINE W REFLEX MICROSCOPIC
Bilirubin, UA: NEGATIVE
Glucose, UA: NEGATIVE
Ketones, UA: NEGATIVE
Leukocytes,UA: NEGATIVE
Nitrite, UA: NEGATIVE
RBC, UA: NEGATIVE
Specific Gravity, UA: 1.02 (ref 1.005–1.030)
Urobilinogen, Ur: 1 mg/dL (ref 0.2–1.0)
pH, UA: 7 (ref 5.0–7.5)

## 2020-09-10 LAB — MICROSCOPIC EXAMINATION
RBC, Urine: NONE SEEN /hpf (ref 0–2)
Renal Epithel, UA: NONE SEEN /hpf

## 2020-09-10 LAB — BLADDER SCAN AMB NON-IMAGING: Scan Result: 7

## 2020-09-10 MED ORDER — TOLTERODINE TARTRATE ER 4 MG PO CP24: 4.0000 mg | ORAL_CAPSULE | Freq: Every day | ORAL | 2 refills | Status: DC

## 2020-09-10 NOTE — Patient Instructions (Signed)
Overactive Bladder, Adult  Overactive bladder is a condition in which a person has a sudden and frequent need to urinate. A person might also leak urine if he or she cannot get to the bathroom fast enough (urinary incontinence). Sometimes, symptoms can interfere with work or social activities. What are the causes? Overactive bladder is associated with poor nerve signals between your bladder and your brain. Your bladder may get the signal to empty before it is full. You may also have very sensitive muscles that make your bladder squeeze too soon. This condition may also be caused by other factors, such as:  Medical conditions: ? Urinary tract infection. ? Infection of nearby tissues. ? Prostate enlargement. ? Bladder stones, inflammation, or tumors. ? Diabetes. ? Muscle or nerve weakness, especially from these conditions:  A spinal cord injury.  Stroke.  Multiple sclerosis.  Parkinson's disease.  Other causes: ? Surgery on the uterus or urethra. ? Drinking too much caffeine or alcohol. ? Certain medicines, especially those that eliminate extra fluid in the body (diuretics). ? Constipation. What increases the risk? You may be at greater risk for overactive bladder if you:  Are an older adult.  Smoke.  Are going through menopause.  Have prostate problems.  Have a neurological disease, such as stroke, dementia, Parkinson's disease, or multiple sclerosis (MS).  Eat or drink alcohol, spicy food, caffeine, and other things that irritate the bladder.  Are overweight or obese. What are the signs or symptoms? Symptoms of this condition include a sudden, strong urge to urinate. Other symptoms include:  Leaking urine.  Urinating 8 or more times a day.  Waking up to urinate 2 or more times overnight. How is this diagnosed? This condition may be diagnosed based on:  Your symptoms and medical history.  A physical exam.  Blood or urine tests to check for possible causes,  such as infection. You may also need to see a health care provider who specializes in urinary tract problems. This is called a urologist. How is this treated? Treatment for overactive bladder depends on the cause of your condition and whether it is mild or severe. Treatment may include:  Bladder training, such as: ? Learning to control the urge to urinate by following a schedule to urinate at regular intervals. ? Doing Kegel exercises to strengthen the pelvic floor muscles that support your bladder.  Special devices, such as: ? Biofeedback. This uses sensors to help you become aware of your body's signals. ? Electrical stimulation. This uses electrodes placed inside the body (implanted) or outside the body. These electrodes send gentle pulses of electricity to strengthen the nerves or muscles that control the bladder. ? Women may use a plastic device, called a pessary, that fits into the vagina and supports the bladder.  Medicines, such as: ? Antibiotics to treat bladder infection. ? Antispasmodics to stop the bladder from releasing urine at the wrong time. ? Tricyclic antidepressants to relax bladder muscles. ? Injections of botulinum toxin type A directly into the bladder tissue to relax bladder muscles.  Surgery, such as: ? A device may be implanted to help manage the nerve signals that control urination. ? An electrode may be implanted to stimulate electrical signals in the bladder. ? A procedure may be done to change the shape of the bladder. This is done only in very severe cases. Follow these instructions at home: Eating and drinking  Make diet or lifestyle changes recommended by your health care provider. These may include: ? Drinking fluids   throughout the day and not only with meals. ? Cutting down on caffeine or alcohol. ? Eating a healthy and balanced diet to prevent constipation. This may include:  Choosing foods that are high in fiber, such as beans, whole grains, and  fresh fruits and vegetables.  Limiting foods that are high in fat and processed sugars, such as fried and sweet foods.   Lifestyle  Lose weight if needed.  Do not use any products that contain nicotine or tobacco. These include cigarettes, chewing tobacco, and vaping devices, such as e-cigarettes. If you need help quitting, ask your health care provider.   General instructions  Take over-the-counter and prescription medicines only as told by your health care provider.  If you were prescribed an antibiotic medicine, take it as told by your health care provider. Do not stop taking the antibiotic even if you start to feel better.  Use any implants or pessary as told by your health care provider.  If needed, wear pads to absorb urine leakage.  Keep a log to track how much and when you drink, and when you need to urinate. This will help your health care provider monitor your condition.  Keep all follow-up visits. This is important. Contact a health care provider if:  You have a fever or chills.  Your symptoms do not get better with treatment.  Your pain and discomfort get worse.  You have more frequent urges to urinate. Get help right away if:  You are not able to control your bladder. Summary  Overactive bladder refers to a condition in which a person has a sudden and frequent need to urinate.  Several conditions may lead to an overactive bladder.  Treatment for overactive bladder depends on the cause and severity of your condition.  Making lifestyle changes, doing Kegel exercises, keeping a log, and taking medicines can help with this condition. This information is not intended to replace advice given to you by your health care provider. Make sure you discuss any questions you have with your health care provider. Document Revised: 05/03/2020 Document Reviewed: 05/03/2020 Elsevier Patient Education  2021 [employer] Inc.  

## 2020-09-10 NOTE — Progress Notes (Signed)
09/10/2020 10:40 AM   Kimberly [name] 1950-12-14 [id number]  Referring provider: Erven Colla, DO [place],  [place] [id number]  OAB followup  HPI: Kimberly [name] is a 70yo here for followup for OAB. She had previously tried mirabegron and Lisbeth Ply which worked well but she could not afford the medication. Detrol is on her formulary. She has urinary urgency, occasional urge incontinence and frequency when she stops her OAB meds.    PMH: Past Medical History:  Diagnosis Date  . Anxiety   . Arthritis   . Depression   . Diabetes mellitus   . GERD (gastroesophageal reflux disease)   . High blood pressure   . Hypothyroid   . Obesity   . PONV (postoperative nausea and vomiting)     Surgical History: Past Surgical History:  Procedure Laterality Date  . ABDOMINAL HYSTERECTOMY    . Bunions Right   . CARPAL TUNNEL RELEASE Right   . KNEE ARTHROSCOPY Right   . THYROID SURGERY     removal  . TOTAL KNEE ARTHROPLASTY Right 08/25/2013   Procedure: TOTAL KNEE ARTHROPLASTY;  Surgeon: Carole Civil, MD;  Location: [facility];  Service: Orthopedics;  Laterality: Right;  . TOTAL KNEE ARTHROPLASTY Left 09/09/2014   Procedure: LEFT TOTAL KNEE ARTHROPLASTY;  Surgeon: Carole Civil, MD;  Location: [facility];  Service: Orthopedics;  Laterality: Left;    Home Medications:  Allergies as of 09/10/2020      Reactions   Ace Inhibitors Cough      Medication List       Accurate as of September 10, 2020 10:40 AM. If you have any questions, ask your nurse or doctor.        aspirin 81 MG tablet Take 81 mg by mouth daily.   diclofenac Sodium 1 % Gel Commonly known as: VOLTAREN Apply topically 4 (four) times daily. OTC   famotidine 40 MG tablet Commonly known as: PEPCID   gabapentin 100 MG capsule Commonly known as: NEURONTIN Take 1 capsule (100 mg total) by mouth 3 (three) times daily.   glipiZIDE 5 MG 24 hr tablet Commonly known as: GLUCOTROL XL Take 2 tablet by mouth  every morning   hydrochlorothiazide 25 MG tablet Commonly known as: HYDRODIURIL Take 1 tablet (25 mg total) by mouth daily.   levothyroxine 125 MCG tablet Commonly known as: SYNTHROID Take 1 tablet by mouth once daily before breakfast   linaclotide 290 MCG Caps capsule Commonly known as: LINZESS Take 290 mcg by mouth daily before breakfast.   losartan 100 MG tablet Commonly known as: COZAAR Take 1 tablet (100 mg total) by mouth every morning.   metFORMIN 500 MG tablet Commonly known as: GLUCOPHAGE Take 2 tablets bid with meals   metoprolol succinate 100 MG 24 hr tablet Commonly known as: TOPROL-XL Take 1 tablet by mouth at bedtime with or immediately following a meal.   mirabegron ER 25 MG Tb24 tablet Commonly known as: MYRBETRIQ Take 1 tablet (25 mg total) by mouth daily.   omeprazole 20 MG capsule Commonly known as: PRILOSEC Take one capsule po qhs   PROBIOTIC-10 PO Take by mouth.   tolterodine 4 MG 24 hr capsule Commonly known as: Detrol LA Take 1 capsule (4 mg total) by mouth daily.   traMADol 50 MG tablet Commonly known as: ULTRAM Two tablets po QHS prn pain   venlafaxine XR 75 MG 24 hr capsule Commonly known as: EFFEXOR-XR Take 1 capsule by mouth daily with breakfast  Allergies:  Allergies  Allergen Reactions  . Ace Inhibitors Cough    Family History: Family History  Problem Relation Age of Onset  . Congestive Heart Failure Father   . Hypertension Father   . Diabetes Mother   . Hypertension Mother   . Hypertension Brother   . Cancer Other   . Diabetes Other     Social History:  reports that she has never smoked. She has never used smokeless tobacco. She reports that she does not drink alcohol and does not use drugs.  ROS: All other review of systems were reviewed and are negative except what is noted above in HPI  Physical Exam: BP (!) 183/74   Pulse 85   Temp 98.8 F (37.1 C)   Ht 5\' 3"  (1.6 m)   Wt 206 lb (93.4 kg)   BMI  36.49 kg/m   Constitutional:  Alert and oriented, No acute distress. HEENT:  AT, moist mucus membranes.  Trachea midline, no masses. Cardiovascular: No clubbing, cyanosis, or edema. Respiratory: Normal respiratory effort, no increased work of breathing. GI: Abdomen is soft, nontender, nondistended, no abdominal masses GU: No CVA tenderness.  Lymph: No cervical or inguinal lymphadenopathy. Skin: No rashes, bruises or suspicious lesions. Neurologic: Grossly intact, no focal deficits, moving all 4 extremities. Psychiatric: Normal mood and affect.  Laboratory Data: Lab Results  Component Value Date   WBC 9.8 04/19/2020   HGB 12.3 04/19/2020   HCT 36.6 04/19/2020   MCV 96 04/19/2020   PLT 336 04/19/2020    Lab Results  Component Value Date   CREATININE 0.90 07/12/2020    No results found for: PSA  No results found for: TESTOSTERONE  Lab Results  Component Value Date   HGBA1C 8.9 (H) 07/12/2020    Urinalysis    Component Value Date/Time   APPEARANCEUR Clear 09/10/2020 1000   GLUCOSEU Negative 09/10/2020 1000   BILIRUBINUR Negative 09/10/2020 1000   PROTEINUR 1+ (A) 09/10/2020 1000   UROBILINOGEN 0.2 05/07/2020 1000   NITRITE Negative 09/10/2020 1000   LEUKOCYTESUR Negative 09/10/2020 1000    Lab Results  Component Value Date   LABMICR See below: 09/10/2020   WBCUA 0-5 09/10/2020   LABEPIT 0-10 09/10/2020   BACTERIA Few 09/10/2020    Pertinent Imaging:  No results found for this or any previous visit.  No results found for this or any previous visit.  No results found for this or any previous visit.  No results found for this or any previous visit.  Results for orders placed during the hospital encounter of 07/02/18  US RENAL  Narrative CLINICAL DATA:  Chronic renal disease.  EXAM: RENAL / URINARY TRACT ULTRASOUND COMPLETE  COMPARISON:  None.  FINDINGS: Right Kidney:  Renal measurements: 11.3 x 5.0 x 5.5 cm = volume: 161 mL . Echogenicity  within normal limits. No mass or hydronephrosis visualized.  Left Kidney:  Renal measurements: 11.1 x 4.4 x 5.8 cm = volume: 147 mL. Echogenicity within normal limits. No mass or hydronephrosis visualized.  Bladder:  Appears normal for degree of bladder distention.  IMPRESSION: Negative exam.   Electronically Signed By: Inge Rise M.D. On: 07/02/2018 16:13  No results found for this or any previous visit.  No results found for this or any previous visit.  No results found for this or any previous visit.   Assessment & Plan:    1. Urinary incontinence, unspecified type -We will trial detrol 4mg . RTC 3 months - Urinalysis, Routine w reflex microscopic -  Bladder Scan (Post Void Residual) in office   Return in about 3 months (around 12/09/2020).  Nicolette Bang, MD  [facility] [place]

## 2020-09-10 NOTE — Progress Notes (Signed)
Urological Symptom Review  Patient is experiencing the following symptoms: Frequent urination Hard to postpone urination Get up at night to urinate Leakage of urine   Review of Systems  Gastrointestinal (upper)  : Negative for upper GI symptoms  Gastrointestinal (lower) : Diarrhea  Constitutional : Night Sweats  Skin: Negative for skin symptoms  Eyes: Negative for eye symptoms  Ear/Nose/Throat : Negative for Ear/Nose/Throat symptoms  Hematologic/Lymphatic: Negative for Hematologic/Lymphatic symptoms  Cardiovascular : Negative for cardiovascular symptoms  Respiratory : Negative for respiratory symptoms  Endocrine: Negative for endocrine symptoms  Musculoskeletal: Back pain  Neurological: Negative for neurological symptoms  Psychologic: Depression

## 2020-09-13 ENCOUNTER — Other Ambulatory Visit: Payer: Self-pay | Admitting: Family Medicine

## 2020-10-12 LAB — BASIC METABOLIC PANEL
BUN/Creatinine Ratio: 21 (ref 12–28)
BUN: 22 mg/dL (ref 8–27)
CO2: 24 mmol/L (ref 20–29)
Calcium: 9.9 mg/dL (ref 8.7–10.3)
Chloride: 95 mmol/L — ABNORMAL LOW (ref 96–106)
Creatinine, Ser: 1.03 mg/dL — ABNORMAL HIGH (ref 0.57–1.00)
GFR calc Af Amer: 65 mL/min/{1.73_m2} (ref >59)
GFR calc non Af Amer: 56 mL/min/{1.73_m2} — ABNORMAL LOW (ref >59)
Glucose: 209 mg/dL — ABNORMAL HIGH (ref 65–99)
Potassium: 4.2 mmol/L (ref 3.5–5.2)
Sodium: 136 mmol/L (ref 134–144)

## 2020-10-12 LAB — HEMOGLOBIN A1C
Est. average glucose Bld gHb Est-mCnc: 163 mg/dL
Hgb A1c MFr Bld: 7.3 % — ABNORMAL HIGH (ref 4.8–5.6)

## 2020-10-13 ENCOUNTER — Encounter: Payer: Self-pay | Admitting: Family Medicine

## 2020-10-13 ENCOUNTER — Other Ambulatory Visit: Payer: Self-pay

## 2020-10-13 ENCOUNTER — Ambulatory Visit (INDEPENDENT_AMBULATORY_CARE_PROVIDER_SITE_OTHER): Payer: Medicare Other | Admitting: Family Medicine

## 2020-10-13 VITALS — BP 138/76 | HR 92 | Temp 97.1°F | Ht 63.0 in | Wt 200.8 lb

## 2020-10-13 DIAGNOSIS — E039 Hypothyroidism, unspecified: Secondary | ICD-10-CM | POA: Diagnosis not present

## 2020-10-13 DIAGNOSIS — K219 Gastro-esophageal reflux disease without esophagitis: Secondary | ICD-10-CM | POA: Diagnosis not present

## 2020-10-13 DIAGNOSIS — I10 Essential (primary) hypertension: Secondary | ICD-10-CM | POA: Diagnosis not present

## 2020-10-13 DIAGNOSIS — E1121 Type 2 diabetes mellitus with diabetic nephropathy: Secondary | ICD-10-CM

## 2020-10-13 MED ORDER — LEVOTHYROXINE SODIUM 125 MCG PO TABS: ORAL_TABLET | ORAL | 1 refills | Status: DC

## 2020-10-13 MED ORDER — OMEPRAZOLE 20 MG PO CPDR: DELAYED_RELEASE_CAPSULE | ORAL | 3 refills | Status: DC

## 2020-10-13 MED ORDER — GLIPIZIDE ER 5 MG PO TB24: ORAL_TABLET | ORAL | 1 refills | Status: DC

## 2020-10-13 MED ORDER — METOPROLOL SUCCINATE ER 100 MG PO TB24
ORAL_TABLET | ORAL | 1 refills | Status: DC
Start: 2020-10-13 — End: 2021-03-16

## 2020-10-13 NOTE — Progress Notes (Signed)
Patient ID: Kimberly [name], female    DOB: 05-13-1951, 70 y.o.   MRN: [id number]   Chief Complaint  Patient presents with  . Diabetes  . Hypothyroidism  . Hypertension  . Gastroesophageal Reflux   Subjective:    HPI  CC- DM2 med check up.  States sugars have been good.   gerd- Taking pepcid and omeprazole and wants to know if she can pepcid bid instead of once daily.   Doing well with dm meds.  Lost 7 lbs.  Started watching carbs and sweets. Cutting down on portions. Eating 130 carbs per day. Last 8.9 now at 7.3 a1c.  Pt says has IBS, occ taking linzess, only when constipated.  Now mostly on loose stool side.   Pt taking pepcid and omeprazole. Had coughing and ENT said to continue this.  Redness in esophagus when they did scope. Coughing improved and not choking with eating.  Went to Dr. Noah Delaine- urology. Bladder spasm- detrol LA and this one affordable for her. mybetrix is too expensive. Helping with bladder spasm. Sleeping better during night. Doing mouth rinse at night.  Has some dry mouth.  Medical History Kimberly [name] has a past medical history of Anxiety, Arthritis, Depression, Diabetes mellitus, GERD (gastroesophageal reflux disease), High blood pressure, Hypothyroid, Obesity, and PONV (postoperative nausea and vomiting).   Outpatient Encounter Medications as of 10/13/2020  Medication Sig  . aspirin 81 MG tablet Take 81 mg by mouth daily.  . diclofenac Sodium (VOLTAREN) 1 % GEL Apply topically 4 (four) times daily. OTC  . gabapentin (NEURONTIN) 100 MG capsule Take 1 capsule (100 mg total) by mouth 3 (three) times daily.  Marland Kitchen linaclotide (LINZESS) 290 MCG CAPS capsule Take 290 mcg by mouth daily before breakfast.  . losartan (COZAAR) 100 MG tablet Take 1 tablet (100 mg total) by mouth every morning.  . metFORMIN (GLUCOPHAGE) 500 MG tablet Take 2 tablets bid with meals  . Probiotic Product (PROBIOTIC-10 PO) Take by mouth.  . tolterodine (DETROL LA) 4 MG 24 hr  capsule Take 1 capsule (4 mg total) by mouth daily.  . traMADol (ULTRAM) 50 MG tablet Two tablets po QHS prn pain  . venlafaxine XR (EFFEXOR-XR) 75 MG 24 hr capsule Take 1 capsule by mouth daily with breakfast  . [DISCONTINUED] glipiZIDE (GLUCOTROL XL) 5 MG 24 hr tablet TAKE 2 TABLETS EVERY MORNING  . [DISCONTINUED] hydrochlorothiazide (HYDRODIURIL) 25 MG tablet Take 1 tablet (25 mg total) by mouth daily.  . [DISCONTINUED] levothyroxine (SYNTHROID) 125 MCG tablet Take 1 tablet by mouth once daily before breakfast  . [DISCONTINUED] metoprolol succinate (TOPROL-XL) 100 MG 24 hr tablet Take 1 tablet by mouth at bedtime with or immediately following a meal.  . [DISCONTINUED] omeprazole (PRILOSEC) 20 MG capsule Take one capsule po qhs  . glipiZIDE (GLUCOTROL XL) 5 MG 24 hr tablet TAKE 2 TABLETS EVERY MORNING  . levothyroxine (SYNTHROID) 125 MCG tablet Take 1 tablet by mouth once daily before breakfast  . metoprolol succinate (TOPROL-XL) 100 MG 24 hr tablet Take 1 tablet by mouth at bedtime with or immediately following a meal.  . omeprazole (PRILOSEC) 20 MG capsule Take one capsule po qhs  . [DISCONTINUED] famotidine (PEPCID) 40 MG tablet   . [DISCONTINUED] mirabegron ER (MYRBETRIQ) 25 MG TB24 tablet Take 1 tablet (25 mg total) by mouth daily.   Facility-Administered Encounter Medications as of 10/13/2020  Medication  . bupivacaine liposome (EXPAREL) 1.3 % injection 266 mg     Review of Systems  Constitutional: Negative  for chills and fever.  HENT: Negative for congestion, rhinorrhea and sore throat.   Respiratory: Negative for cough, shortness of breath and wheezing.   Cardiovascular: Negative for chest pain and leg swelling.  Gastrointestinal: Negative for abdominal pain, diarrhea, nausea and vomiting.  Genitourinary: Negative for dysuria and frequency.  Musculoskeletal: Negative for arthralgias and back pain.  Skin: Negative for rash.  Neurological: Negative for dizziness, weakness and  headaches.     Vitals BP 138/76   Pulse 92   Temp (!) 97.1 F (36.2 C)   Ht 5\' 3"  (1.6 m)   Wt 200 lb 12.8 oz (91.1 kg)   SpO2 97%   BMI 35.57 kg/m   Objective:   Physical Exam Vitals and nursing note reviewed.  Constitutional:      Appearance: Normal appearance.  HENT:     Head: Normocephalic and atraumatic.     Nose: Nose normal.     Mouth/Throat:     Mouth: Mucous membranes are moist.     Pharynx: Oropharynx is clear.  Eyes:     Extraocular Movements: Extraocular movements intact.     Conjunctiva/sclera: Conjunctivae normal.     Pupils: Pupils are equal, round, and reactive to light.  Cardiovascular:     Rate and Rhythm: Normal rate and regular rhythm.     Pulses: Normal pulses.     Heart sounds: Normal heart sounds.  Pulmonary:     Effort: Pulmonary effort is normal.     Breath sounds: Normal breath sounds. No wheezing, rhonchi or rales.  Musculoskeletal:        General: Normal range of motion.     Right lower leg: No edema.     Left lower leg: No edema.  Skin:    General: Skin is warm and dry.     Findings: No lesion or rash.  Neurological:     General: No focal deficit present.     Mental Status: She is alert and oriented to person, place, and time.  Psychiatric:        Mood and Affect: Mood normal.        Behavior: Behavior normal.    Assessment and Plan   1. Type 2 diabetes mellitus with diabetic nephropathy, without long-term current use of insulin (HCC) - glipiZIDE (GLUCOTROL XL) 5 MG 24 hr tablet; TAKE 2 TABLETS EVERY MORNING  Dispense: 180 tablet; Refill: 1  2. Gastroesophageal reflux disease without esophagitis - omeprazole (PRILOSEC) 20 MG capsule; Take one capsule po qhs  Dispense: 90 capsule; Refill: 3  3. Essential hypertension, benign - metoprolol succinate (TOPROL-XL) 100 MG 24 hr tablet; Take 1 tablet by mouth at bedtime with or immediately following a meal.  Dispense: 90 tablet; Refill: 1  4. Hypothyroidism, unspecified type -  levothyroxine (SYNTHROID) 125 MCG tablet; Take 1 tablet by mouth once daily before breakfast  Dispense: 90 tablet; Refill: 1    htn- suboptimal. Cont to monitor. Pt going to cont to dec salt and increase in exercising.  Will cont meds.  gerd-stable. Cont meds.  dm2- stable, improved. Cont meds. a1c 7.3, improved from last visit at 8.9.  Hypothyroid- stable. Cont meds.  F/u 62mo.

## 2020-10-15 ENCOUNTER — Telehealth: Payer: Self-pay | Admitting: Family Medicine

## 2020-10-15 ENCOUNTER — Other Ambulatory Visit: Payer: Self-pay | Admitting: *Deleted

## 2020-10-15 MED ORDER — HYDROCHLOROTHIAZIDE 25 MG PO TABS: 25.0000 mg | ORAL_TABLET | Freq: Every day | ORAL | 1 refills | Status: DC

## 2020-10-15 MED ORDER — FAMOTIDINE 40 MG PO TABS: 40.0000 mg | ORAL_TABLET | Freq: Every day | ORAL | 1 refills | Status: DC

## 2020-10-15 NOTE — Telephone Encounter (Signed)
Prescription sent electronically to pharmacy. Patient notified. 

## 2020-10-15 NOTE — Telephone Encounter (Signed)
Patient is requesting refill on famotidine 40 mg and hydrochlorothiazide 25 mg called into [facility] had med check on 10/13/20. Please advise

## 2020-10-15 NOTE — Telephone Encounter (Signed)
Pt recently seen and note states pt wanted to increase pepcid to bid. Also taking omeprazole daily. Pt states dr Lovena Le told her to continue taking both meds once daily but she did not send in refill of pepcid. Pepcid is under historical provider. Ok to send in refill? Also wanting hctz refilled.

## 2020-10-15 NOTE — Telephone Encounter (Signed)
Would be fine to give 6 months worth on each

## 2020-11-14 ENCOUNTER — Other Ambulatory Visit: Payer: Self-pay | Admitting: Family Medicine

## 2020-11-15 ENCOUNTER — Other Ambulatory Visit: Payer: Self-pay | Admitting: Family Medicine

## 2020-11-15 NOTE — Telephone Encounter (Signed)
Last med check up 10/13/20

## 2020-11-16 ENCOUNTER — Telehealth: Payer: Self-pay

## 2020-11-16 NOTE — Telephone Encounter (Signed)
Pls ask pt how she is taking the gabapentin and what it is for?  Since hasn't been prescribed by me.  Last fill was 5/21 and would have ran out in 11/21.   Thanks,   Dr. Lovena Le

## 2020-11-16 NOTE — Telephone Encounter (Signed)
Pt need refill on Gabapentin 100 MG Capsule sent to [facility] [place], [place] call back [phone]

## 2020-11-16 NOTE — Telephone Encounter (Signed)
Pt last seen 10/07/20 for DM. Please advise. Thank you

## 2020-11-16 NOTE — Telephone Encounter (Signed)
Left message to return call 

## 2020-11-17 NOTE — Telephone Encounter (Signed)
Patient stated she has been taking for her back and it has been prescribed by Dr Kenton Kingfisher her ortho specialist. Patient stated she will call their office to get a refill

## 2020-11-19 ENCOUNTER — Telehealth: Payer: Self-pay | Admitting: Family Medicine

## 2020-11-19 DIAGNOSIS — E1121 Type 2 diabetes mellitus with diabetic nephropathy: Secondary | ICD-10-CM

## 2020-11-19 MED ORDER — METFORMIN HCL 500 MG PO TABS: ORAL_TABLET | ORAL | 0 refills | Status: DC

## 2020-11-19 NOTE — Telephone Encounter (Signed)
Pt returned call and verbalized understanding  

## 2020-11-19 NOTE — Telephone Encounter (Signed)
Patient would like her metformin 500 mg [facility]. Please advise

## 2020-11-19 NOTE — Telephone Encounter (Signed)
Prescription sent electronically to pharmacy. Left message to return call to notify patient. 

## 2020-11-29 ENCOUNTER — Encounter: Payer: Self-pay | Admitting: Orthopedic Surgery

## 2020-11-29 ENCOUNTER — Other Ambulatory Visit: Payer: Self-pay

## 2020-11-29 ENCOUNTER — Ambulatory Visit: Payer: Medicare Other

## 2020-11-29 ENCOUNTER — Ambulatory Visit (INDEPENDENT_AMBULATORY_CARE_PROVIDER_SITE_OTHER): Payer: Medicare Other | Admitting: Orthopedic Surgery

## 2020-11-29 VITALS — BP 152/79 | HR 69 | Ht 63.0 in | Wt 200.0 lb

## 2020-11-29 DIAGNOSIS — Z96652 Presence of left artificial knee joint: Secondary | ICD-10-CM | POA: Diagnosis not present

## 2020-11-29 DIAGNOSIS — M1711 Unilateral primary osteoarthritis, right knee: Secondary | ICD-10-CM

## 2020-11-29 DIAGNOSIS — M48062 Spinal stenosis, lumbar region with neurogenic claudication: Secondary | ICD-10-CM

## 2020-11-29 DIAGNOSIS — R748 Abnormal levels of other serum enzymes: Secondary | ICD-10-CM | POA: Insufficient documentation

## 2020-11-29 DIAGNOSIS — M1712 Unilateral primary osteoarthritis, left knee: Secondary | ICD-10-CM

## 2020-11-29 DIAGNOSIS — Z96651 Presence of right artificial knee joint: Secondary | ICD-10-CM | POA: Diagnosis not present

## 2020-11-29 DIAGNOSIS — K5904 Chronic idiopathic constipation: Secondary | ICD-10-CM | POA: Insufficient documentation

## 2020-11-29 DIAGNOSIS — K7581 Nonalcoholic steatohepatitis (NASH): Secondary | ICD-10-CM | POA: Insufficient documentation

## 2020-11-29 DIAGNOSIS — R635 Abnormal weight gain: Secondary | ICD-10-CM | POA: Insufficient documentation

## 2020-11-29 DIAGNOSIS — K8020 Calculus of gallbladder without cholecystitis without obstruction: Secondary | ICD-10-CM | POA: Insufficient documentation

## 2020-11-29 MED ORDER — GABAPENTIN 100 MG PO CAPS: 100.0000 mg | ORAL_CAPSULE | Freq: Three times a day (TID) | ORAL | 0 refills | Status: DC

## 2020-11-29 NOTE — Progress Notes (Signed)
Chief Complaint  Patient presents with  . Post-op Follow-up    Bilateral knee replacements right 2014 left 2016    Encounter Diagnoses  Name Primary?  . S/P total knee replacement, right 2014 Yes  . S/P total knee replacement, left 09/09/14   . Primary osteoarthritis of left knee   . Primary osteoarthritis of right knee   . Spinal stenosis of lumbar region with neurogenic claudication     We have several things were addressing today were following up on a right total knee from 2014 a left total knee from 2016 and chronic spinal stenosis  Kimberly [name] says her knees are doing fine she does have some symptoms with her back no leg pain.  Chest pain across the lower part of her back is exacerbated by long periods of standing.  She did get a new bed which is adjustable and when she raises her legs up and takes her gabapentin 3 times a day it usually controls the pain    Review of Systems  Neurological: Negative for sensory change and focal weakness.   BP (!) 152/79   Pulse 69   Ht 5\' 3"  (1.6 m)   Wt 200 lb (90.7 kg)   BMI 35.43 kg/m   Physical Exam Vitals and nursing note reviewed.  Constitutional:      Appearance: Normal appearance.  Musculoskeletal:     Comments: Right and left knees have full range of motion 0 to 120 degrees with no instability and no pain tenderness or swelling    Neurological:     Mental Status: She is alert and oriented to person, place, and time.  Psychiatric:        Mood and Affect: Mood normal.     Images of both total knees show 2 stable total knees  Meds ordered this encounter  Medications  . DISCONTD: gabapentin (NEURONTIN) 100 MG capsule    Sig: Take 1 capsule (100 mg total) by mouth 3 (three) times daily for 14 days.    Dispense:  42 capsule    Refill:  0  . gabapentin (NEURONTIN) 100 MG capsule    Sig: Take 1 capsule (100 mg total) by mouth 3 (three) times daily.    Dispense:  540 capsule    Refill:  0    Recommendations for to continue  with activity modification continue her gabapentin follow-up with [place] in 2 years  Coding: 3 chronic illnesses stable in a prescription.

## 2020-12-09 ENCOUNTER — Other Ambulatory Visit: Payer: Self-pay

## 2020-12-09 DIAGNOSIS — R32 Unspecified urinary incontinence: Secondary | ICD-10-CM

## 2020-12-09 MED ORDER — TOLTERODINE TARTRATE ER 4 MG PO CP24: 4.0000 mg | ORAL_CAPSULE | Freq: Every day | ORAL | 2 refills | Status: DC

## 2020-12-15 ENCOUNTER — Ambulatory Visit: Payer: Medicare Other | Admitting: Urology

## 2021-01-11 ENCOUNTER — Ambulatory Visit: Payer: Medicare Other | Admitting: Family Medicine

## 2021-01-26 ENCOUNTER — Other Ambulatory Visit: Payer: Self-pay | Admitting: Family Medicine

## 2021-01-26 DIAGNOSIS — E1121 Type 2 diabetes mellitus with diabetic nephropathy: Secondary | ICD-10-CM

## 2021-02-08 ENCOUNTER — Telehealth (INDEPENDENT_AMBULATORY_CARE_PROVIDER_SITE_OTHER): Payer: Medicare Other | Admitting: Urology

## 2021-02-08 ENCOUNTER — Other Ambulatory Visit: Payer: Self-pay

## 2021-02-08 ENCOUNTER — Encounter: Payer: Self-pay | Admitting: Urology

## 2021-02-08 DIAGNOSIS — R32 Unspecified urinary incontinence: Secondary | ICD-10-CM | POA: Diagnosis not present

## 2021-02-08 MED ORDER — TOLTERODINE TARTRATE ER 4 MG PO CP24: 4.0000 mg | ORAL_CAPSULE | Freq: Every day | ORAL | 3 refills | Status: DC

## 2021-02-08 NOTE — Patient Instructions (Signed)

## 2021-02-08 NOTE — Progress Notes (Signed)
02/08/2021 4:02 PM   Kimberly [name] 1950/10/10 [id number]  Referring provider: Erven Colla, DO [address] [place],  [place] [address]  Patient location: home Physician location: office I connected with  Kimberly [name] on 02/08/21 by a video enabled telemedicine application and verified that I am speaking with the correct person using two identifiers.   I discussed the limitations of evaluation and management by telemedicine. The patient expressed understanding and agreed to proceed.    Followuop OAB  HPI: Kimberly [name] is [phone] here for followup for OAB. She is currently on detrol LA 4mg  daily. She has stable urgency and rare urge incontinence. No hestitancy or straining to urinate. No other complaints today   PMH: Past Medical History:  Diagnosis Date   Anxiety    Arthritis    Depression    Diabetes mellitus    GERD (gastroesophageal reflux disease)    High blood pressure    Hypothyroid    Obesity    PONV (postoperative nausea and vomiting)     Surgical History: Past Surgical History:  Procedure Laterality Date   ABDOMINAL HYSTERECTOMY     [place]unions Right    CARPAL TUNNEL RELEASE Right    KNEE ARTHROSCOPY Right    THYROID SURGERY     removal   TOTAL KNEE ARTHROPLASTY Right 08/25/2013   Procedure: TOTAL KNEE ARTHROPLASTY;  Surgeon: Carole Civil, MD;  Location: [facility];  Service: Orthopedics;  Laterality: Right;   TOTAL KNEE ARTHROPLASTY Left 09/09/2014   Procedure: LEFT TOTAL KNEE ARTHROPLASTY;  Surgeon: Carole Civil, MD;  Location: [facility];  Service: Orthopedics;  Laterality: Left;    Home Medications:  Allergies as of 02/08/2021       Reactions   Ace Inhibitors Cough        Medication List        Accurate as of February 08, 2021  4:02 PM. If you have any questions, ask your nurse or doctor.          aspirin 81 MG tablet Take 81 mg by mouth daily.   diclofenac Sodium 1 % Gel Commonly known as: VOLTAREN Apply topically 4 (four) times  daily. OTC   famotidine 40 MG tablet Commonly known as: PEPCID Take 1 tablet (40 mg total) by mouth daily.   gabapentin 100 MG capsule Commonly known as: NEURONTIN Take 1 capsule (100 mg total) by mouth 3 (three) times daily.   glipiZIDE 5 MG 24 hr tablet Commonly known as: GLUCOTROL XL TAKE 2 TABLETS EVERY MORNING   hydrochlorothiazide 25 MG tablet Commonly known as: HYDRODIURIL Take 1 tablet (25 mg total) by mouth daily.   levothyroxine 125 MCG tablet Commonly known as: SYNTHROID Take 1 tablet by mouth once daily before breakfast   linaclotide 290 MCG Caps capsule Commonly known as: LINZESS Take 290 mcg by mouth daily before breakfast.   losartan 100 MG tablet Commonly known as: COZAAR TAKE 1 TABLET EVERY MORNING   metFORMIN 500 MG tablet Commonly known as: GLUCOPHAGE TAKE 2 TABLETS 2 TIMES     DAILY WITH MEALS (NEW      DIRECTIONS)   metoprolol succinate 100 MG 24 hr tablet Commonly known as: TOPROL-XL Take 1 tablet by mouth at bedtime with or immediately following a meal.   omeprazole 20 MG capsule Commonly known as: PRILOSEC Take one capsule po qhs   PROBIOTIC-10 PO Take by mouth.   tolterodine 4 MG 24 hr capsule Commonly known as: Detrol LA Take 1 capsule (  4 mg total) by mouth daily.   traMADol 50 MG tablet Commonly known as: ULTRAM Two tablets po QHS prn pain   venlafaxine XR 75 MG 24 hr capsule Commonly known as: EFFEXOR-XR TAKE 1 CAPSULE DAILY WITH  BREAKFAST        Allergies:  Allergies  Allergen Reactions   Ace Inhibitors Cough    Family History: Family History  Problem Relation Age of Onset   Congestive Heart Failure Father    Hypertension Father    Diabetes Mother    Hypertension Mother    Hypertension Brother    Cancer Other    Diabetes Other     Social History:  reports that she has never smoked. She has never used smokeless tobacco. She reports that she does not drink alcohol and does not use drugs.  ROS: All other  review of systems were reviewed and are negative except what is noted above in HPI   Laboratory Data: Lab Results  Component Value Date   WBC 9.8 04/19/2020   HGB 12.3 04/19/2020   HCT 36.6 04/19/2020   MCV 96 04/19/2020   PLT 336 04/19/2020    Lab Results  Component Value Date   CREATININE 1.03 (H) 10/11/2020    No results found for: PSA  No results found for: TESTOSTERONE  Lab Results  Component Value Date   HGBA1C 7.3 (H) 10/11/2020    Urinalysis    Component Value Date/Time   APPEARANCEUR Clear 09/10/2020 1000   GLUCOSEU Negative 09/10/2020 1000   BILIRUBINUR Negative 09/10/2020 1000   PROTEINUR 1+ (A) 09/10/2020 1000   UROBILINOGEN 0.2 05/07/2020 1000   NITRITE Negative 09/10/2020 1000   LEUKOCYTESUR Negative 09/10/2020 1000    Lab Results  Component Value Date   LABMICR See below: 09/10/2020   WBCUA 0-5 09/10/2020   LABEPIT 0-10 09/10/2020   BACTERIA Few 09/10/2020    Pertinent Imaging:  No results found for this or any previous visit.  No results found for this or any previous visit.  No results found for this or any previous visit.  No results found for this or any previous visit.  Results for orders placed during the hospital encounter of 07/02/18  US RENAL  Narrative CLINICAL DATA:  Chronic renal disease.  EXAM: RENAL / URINARY TRACT ULTRASOUND COMPLETE  COMPARISON:  None.  FINDINGS: Right Kidney:  Renal measurements: 11.3 x 5.0 x 5.5 cm = volume: 161 mL . Echogenicity within normal limits. No mass or hydronephrosis visualized.  Left Kidney:  Renal measurements: 11.1 x 4.4 x 5.8 cm = volume: 147 mL. Echogenicity within normal limits. No mass or hydronephrosis visualized.  Bladder:  Appears normal for degree of bladder distention.  IMPRESSION: Negative exam.   Electronically Signed By: Inge Rise M.D. On: 07/02/2018 16:13  No results found for this or any previous visit.  No results found for this or any  previous visit.  No results found for this or any previous visit.   Assessment & Plan:    1. Urinary incontinence, unspecified type -Continue detrol LA 4mg  daily   No follow-ups on file.  Nicolette Bang, MD  [facility] [place]

## 2021-03-15 ENCOUNTER — Other Ambulatory Visit: Payer: Self-pay | Admitting: Family Medicine

## 2021-03-15 DIAGNOSIS — I10 Essential (primary) hypertension: Secondary | ICD-10-CM

## 2021-03-15 DIAGNOSIS — E039 Hypothyroidism, unspecified: Secondary | ICD-10-CM

## 2021-03-15 DIAGNOSIS — E1121 Type 2 diabetes mellitus with diabetic nephropathy: Secondary | ICD-10-CM

## 2021-03-22 DIAGNOSIS — H524 Presbyopia: Secondary | ICD-10-CM | POA: Diagnosis not present

## 2021-03-22 DIAGNOSIS — E119 Type 2 diabetes mellitus without complications: Secondary | ICD-10-CM | POA: Diagnosis not present

## 2021-03-22 LAB — HM DIABETES EYE EXAM

## 2021-04-09 ENCOUNTER — Other Ambulatory Visit: Payer: Self-pay | Admitting: Family Medicine

## 2021-04-09 DIAGNOSIS — E1121 Type 2 diabetes mellitus with diabetic nephropathy: Secondary | ICD-10-CM

## 2021-04-11 ENCOUNTER — Other Ambulatory Visit: Payer: Self-pay

## 2021-04-11 ENCOUNTER — Ambulatory Visit (INDEPENDENT_AMBULATORY_CARE_PROVIDER_SITE_OTHER): Payer: Medicare Other | Admitting: Family Medicine

## 2021-04-11 ENCOUNTER — Encounter: Payer: Self-pay | Admitting: Family Medicine

## 2021-04-11 VITALS — BP 132/86 | HR 96 | Temp 97.2°F | Wt 204.6 lb

## 2021-04-11 DIAGNOSIS — I10 Essential (primary) hypertension: Secondary | ICD-10-CM | POA: Diagnosis not present

## 2021-04-11 DIAGNOSIS — E1121 Type 2 diabetes mellitus with diabetic nephropathy: Secondary | ICD-10-CM

## 2021-04-11 DIAGNOSIS — E039 Hypothyroidism, unspecified: Secondary | ICD-10-CM | POA: Diagnosis not present

## 2021-04-11 DIAGNOSIS — K7581 Nonalcoholic steatohepatitis (NASH): Secondary | ICD-10-CM | POA: Diagnosis not present

## 2021-04-11 DIAGNOSIS — K219 Gastro-esophageal reflux disease without esophagitis: Secondary | ICD-10-CM

## 2021-04-11 MED ORDER — LOSARTAN POTASSIUM 100 MG PO TABS: 100.0000 mg | ORAL_TABLET | Freq: Every morning | ORAL | 1 refills | Status: DC

## 2021-04-11 MED ORDER — OMEPRAZOLE 20 MG PO CPDR: DELAYED_RELEASE_CAPSULE | ORAL | 1 refills | Status: DC

## 2021-04-11 MED ORDER — HYDROCHLOROTHIAZIDE 25 MG PO TABS: 25.0000 mg | ORAL_TABLET | Freq: Every day | ORAL | 0 refills | Status: DC

## 2021-04-11 MED ORDER — METOPROLOL SUCCINATE ER 100 MG PO TB24: ORAL_TABLET | ORAL | 1 refills | Status: DC

## 2021-04-11 MED ORDER — VENLAFAXINE HCL ER 75 MG PO CP24: ORAL_CAPSULE | ORAL | 1 refills | Status: DC

## 2021-04-11 MED ORDER — LEVOTHYROXINE SODIUM 125 MCG PO TABS: ORAL_TABLET | ORAL | 0 refills | Status: DC

## 2021-04-11 MED ORDER — FAMOTIDINE 40 MG PO TABS: 40.0000 mg | ORAL_TABLET | Freq: Every day | ORAL | 0 refills | Status: DC

## 2021-04-11 NOTE — Progress Notes (Signed)
Patient ID: Kimberly [name], female    DOB: Dec 18, 1950, 70 y.o.   MRN: [id number]   Chief Complaint  Patient presents with   Diabetes   Hypertension   Hypothyroidism   Subjective:    HPI Pt here for follow up on DM, HTN and hypothyroidism. Pt states she is doing well. Started water aerobics. No issues with blood pressure. Taking meds as directed. Pt does not check sugar and would like to have A1C checked. Pt taking pepcid, tums and Prilosec but still having lots of gas. Diarrhea is still bad per pt.  Pt has noticed issues with legs burning and itching for a few hours after being out in the grass. Has noticed it has worsened over the past few years.   Dm2- Compliant with medications. Checking blood glucose.   Not seeing any high or low numbers.  Denies polyuria or polydipsia.  Eye exam: has one recently. Foot exam: no new concerns. Lab Results  Component Value Date   HGBA1C 7.6 (H) 04/11/2021    H/o IBS- Has more gas with flatulence recently. Taking lots of tums.  Has some diarrhea.  Seeing GI for IBS. Has linzess. Having 4-5 days of diarrhea and not able to get out. Last week, so much gas that hemorrhoids from gas. Used hemorrhoid cream. Itchiness in vaginal area. Tried witch hazel.  Not checking bg.  Stopped the sodas and sweets.  Stopping eating at 4pm.   HTN Pt compliant with BP meds.  No SEs Denies chest pain, sob, LE swelling, or blurry vision.  Hypothyroidism- Not having any side effects from thyroid meds.  Compliant with meds. No diarrhea, constipation, depression/anxiety, palpitations, excessive hair loss or weight gain/loss. No heat/cold intolerance.   Medical History Kimberly [name] has a past medical history of Anxiety, Arthritis, Depression, Diabetes mellitus, GERD (gastroesophageal reflux disease), High blood pressure, Hypothyroid, Obesity, and PONV (postoperative nausea and vomiting).   Outpatient Encounter Medications as of 04/11/2021   Medication Sig   diclofenac Sodium (VOLTAREN) 1 % GEL Apply topically 4 (four) times daily. OTC   gabapentin (NEURONTIN) 100 MG capsule Take 1 capsule (100 mg total) by mouth 3 (three) times daily.   linaclotide (LINZESS) 290 MCG CAPS capsule Take 290 mcg by mouth daily before breakfast.   Probiotic Product (PROBIOTIC-10 PO) Take by mouth.   tolterodine (DETROL LA) 4 MG 24 hr capsule Take 1 capsule (4 mg total) by mouth daily.   traMADol (ULTRAM) 50 MG tablet Two tablets po QHS prn pain   [DISCONTINUED] famotidine (PEPCID) 40 MG tablet TAKE 1 TABLET DAILY   [DISCONTINUED] glipiZIDE (GLUCOTROL XL) 5 MG 24 hr tablet TAKE 2 TABLETS EVERY       MORNING   [DISCONTINUED] hydrochlorothiazide (HYDRODIURIL) 25 MG tablet TAKE 1 TABLET DAILY   [DISCONTINUED] levothyroxine (SYNTHROID) 125 MCG tablet TAKE 1 TABLET ONCE DAILY   BEFORE BREAKFAST   [DISCONTINUED] losartan (COZAAR) 100 MG tablet Take 1 tablet (100 mg total) by mouth every morning. APPT NEEDED FOR FUTURE REFILLS   [DISCONTINUED] metFORMIN (GLUCOPHAGE) 500 MG tablet TAKE 2 TABLETS 2 TIMES     DAILY WITH MEALS (NEW      DIRECTIONS)  APPT NEEDED FOR FUTURE REFILLS   [DISCONTINUED] metoprolol succinate (TOPROL-XL) 100 MG 24 hr tablet TAKE 1 TABLET AT BEDTIME   WITH OR IMMEDIATELY        FOLLOWING A MEAL   [DISCONTINUED] omeprazole (PRILOSEC) 20 MG capsule Take one capsule po qhs   [DISCONTINUED] venlafaxine XR (EFFEXOR-XR)  75 MG 24 hr capsule TAKE 1 CAPSULE DAILY WITH  BREAKFAST APPT NEEDED FOR FUTURE REFILLS   aspirin 81 MG tablet Take 81 mg by mouth daily. (Patient not taking: Reported on 04/11/2021)   glipiZIDE (GLUCOTROL XL) 5 MG 24 hr tablet Take 2 tablets (10 mg total) by mouth every morning.   [DISCONTINUED] famotidine (PEPCID) 40 MG tablet Take 1 tablet (40 mg total) by mouth daily.   [DISCONTINUED] hydrochlorothiazide (HYDRODIURIL) 25 MG tablet Take 1 tablet (25 mg total) by mouth daily.   [DISCONTINUED] levothyroxine (SYNTHROID) 125 MCG  tablet TAKE 1 TABLET ONCE DAILY   BEFORE BREAKFAST   [DISCONTINUED] losartan (COZAAR) 100 MG tablet Take 1 tablet (100 mg total) by mouth every morning.   [DISCONTINUED] metoprolol succinate (TOPROL-XL) 100 MG 24 hr tablet TAKE 1 TABLET AT BEDTIME   WITH OR IMMEDIATELY        FOLLOWING A MEAL   [DISCONTINUED] omeprazole (PRILOSEC) 20 MG capsule Take one capsule po qhs   [DISCONTINUED] venlafaxine XR (EFFEXOR-XR) 75 MG 24 hr capsule TAKE 1 CAPSULE DAILY WITH  BREAKFAST   Facility-Administered Encounter Medications as of 04/11/2021  Medication   bupivacaine liposome (EXPAREL) 1.3 % injection 266 mg     Review of Systems  Constitutional:  Negative for chills and fever.  HENT:  Negative for congestion, rhinorrhea and sore throat.   Respiratory:  Negative for cough, shortness of breath and wheezing.   Cardiovascular:  Negative for chest pain and leg swelling.  Gastrointestinal:  Negative for abdominal pain, diarrhea, nausea and vomiting.  Genitourinary:  Negative for dysuria and frequency.  Musculoskeletal:  Negative for arthralgias and back pain.  Skin:  Negative for rash.  Neurological:  Negative for dizziness, weakness and headaches.    Vitals BP 132/86   Pulse 96   Temp (!) 97.2 F (36.2 C)   Wt 204 lb 9.6 oz (92.8 kg)   SpO2 98%   BMI 36.24 kg/m   Objective:   Physical Exam Vitals and nursing note reviewed.  Constitutional:      Appearance: Normal appearance.  HENT:     Head: Normocephalic and atraumatic.     Nose: Nose normal.     Mouth/Throat:     Mouth: Mucous membranes are moist.     Pharynx: Oropharynx is clear.  Eyes:     Extraocular Movements: Extraocular movements intact.     Conjunctiva/sclera: Conjunctivae normal.     Pupils: Pupils are equal, round, and reactive to light.  Cardiovascular:     Rate and Rhythm: Normal rate and regular rhythm.     Pulses: Normal pulses.     Heart sounds: Normal heart sounds.  Pulmonary:     Effort: Pulmonary effort is  normal.     Breath sounds: Normal breath sounds. No wheezing, rhonchi or rales.  Musculoskeletal:        General: Normal range of motion.     Right lower leg: No edema.     Left lower leg: No edema.  Skin:    General: Skin is warm and dry.     Findings: No lesion or rash.  Neurological:     General: No focal deficit present.     Mental Status: She is alert and oriented to person, place, and time.  Psychiatric:        Mood and Affect: Mood normal.        Behavior: Behavior normal.     Assessment and Plan   1. Type 2 diabetes mellitus with  diabetic nephropathy, without long-term current use of insulin (HCC) - CBC - CMP14+EGFR - Hemoglobin A1c - Lipid panel - Microalbumin, urine - Ambulatory referral to Endocrinology - glipiZIDE (GLUCOTROL XL) 5 MG 24 hr tablet; Take 2 tablets (10 mg total) by mouth every morning.  Dispense: 180 tablet; Refill: 1  2. Essential hypertension, benign - CMP14+EGFR - Lipid panel  3. Hypothyroidism, unspecified type - TSH  4. Gastroesophageal reflux disease without esophagitis  5. Nonalcoholic steatohepatitis (NASH)   Eye exam- Just got eye glasses.  Exam-pt stating no retinopathy- dr. Chong Sicilian. Will request records.  After labs reviewed, will refill diabetic meds.   Sent in refills to mail order.  Addendum- lab review Blood count is normal. Glucose at 173, all other electrolytes are in the normal range. Your ALT, and your liver enzyme is slightly elevated at 35, normal is 0-32.  Continue to work on decreasing your weight and working on [employer]. A1c is slightly elevated at 7.6, last time at 7.3.  Continue to work on your diabetic diet and increase in exercising.   Thyroid is in the normal range.  It is on the low end of normal, continue to monitor this and recheck it on your next visit. Cholesterol, stable continue to decrease carbs in your diet since your triglycerides are elevated at 253, we want this under 150. Urine microalbumin test  is normal.   Continue all medications.    Return in about 4 months (around 08/11/2021) for fu dm2, htn.

## 2021-04-12 ENCOUNTER — Telehealth: Payer: Self-pay | Admitting: Family Medicine

## 2021-04-12 DIAGNOSIS — E1121 Type 2 diabetes mellitus with diabetic nephropathy: Secondary | ICD-10-CM

## 2021-04-12 DIAGNOSIS — I10 Essential (primary) hypertension: Secondary | ICD-10-CM

## 2021-04-12 DIAGNOSIS — E039 Hypothyroidism, unspecified: Secondary | ICD-10-CM

## 2021-04-12 DIAGNOSIS — K219 Gastro-esophageal reflux disease without esophagitis: Secondary | ICD-10-CM

## 2021-04-12 LAB — MICROALBUMIN, URINE: Microalbumin, Urine: 25.1 ug/mL

## 2021-04-12 LAB — CMP14+EGFR
ALT: 35 IU/L — ABNORMAL HIGH (ref 0–32)
AST: 26 IU/L (ref 0–40)
Albumin/Globulin Ratio: 2 (ref 1.2–2.2)
Albumin: 5.2 g/dL — ABNORMAL HIGH (ref 3.8–4.8)
Alkaline Phosphatase: 82 IU/L (ref 44–121)
BUN/Creatinine Ratio: 21 (ref 12–28)
BUN: 20 mg/dL (ref 8–27)
Bilirubin Total: 0.4 mg/dL (ref 0.0–1.2)
CO2: 21 mmol/L (ref 20–29)
Calcium: 10.1 mg/dL (ref 8.7–10.3)
Chloride: 98 mmol/L (ref 96–106)
Creatinine, Ser: 0.97 mg/dL (ref 0.57–1.00)
Globulin, Total: 2.6 g/dL (ref 1.5–4.5)
Glucose: 173 mg/dL — ABNORMAL HIGH (ref 65–99)
Potassium: 4 mmol/L (ref 3.5–5.2)
Sodium: 138 mmol/L (ref 134–144)
Total Protein: 7.8 g/dL (ref 6.0–8.5)
eGFR: 63 mL/min/{1.73_m2} (ref >59)

## 2021-04-12 LAB — CBC
Hematocrit: 35.9 % (ref 34.0–46.6)
Hemoglobin: 12 g/dL (ref 11.1–15.9)
MCH: 32.2 pg (ref 26.6–33.0)
MCHC: 33.4 g/dL (ref 31.5–35.7)
MCV: 96 fL (ref 79–97)
Platelets: 307 10*3/uL (ref 150–450)
RBC: 3.73 x10E6/uL — ABNORMAL LOW (ref 3.77–5.28)
RDW: 11.8 % (ref 11.7–15.4)
WBC: 8.1 10*3/uL (ref 3.4–10.8)

## 2021-04-12 LAB — TSH: TSH: 0.653 u[IU]/mL (ref 0.450–4.500)

## 2021-04-12 LAB — LIPID PANEL
Chol/HDL Ratio: 3.9 ratio (ref 0.0–4.4)
Cholesterol, Total: 163 mg/dL (ref 100–199)
HDL: 42 mg/dL (ref >39)
LDL Chol Calc (NIH): 79 mg/dL (ref 0–99)
Triglycerides: 253 mg/dL — ABNORMAL HIGH (ref 0–149)
VLDL Cholesterol Cal: 42 mg/dL — ABNORMAL HIGH (ref 5–40)

## 2021-04-12 LAB — HEMOGLOBIN A1C
Est. average glucose Bld gHb Est-mCnc: 171 mg/dL
Hgb A1c MFr Bld: 7.6 % — ABNORMAL HIGH (ref 4.8–5.6)

## 2021-04-12 MED ORDER — LOSARTAN POTASSIUM 100 MG PO TABS: 100.0000 mg | ORAL_TABLET | Freq: Every morning | ORAL | 1 refills | Status: DC

## 2021-04-12 MED ORDER — VENLAFAXINE HCL ER 75 MG PO CP24: ORAL_CAPSULE | ORAL | 1 refills | Status: DC

## 2021-04-12 MED ORDER — OMEPRAZOLE 20 MG PO CPDR: DELAYED_RELEASE_CAPSULE | ORAL | 1 refills | Status: DC

## 2021-04-12 MED ORDER — LEVOTHYROXINE SODIUM 125 MCG PO TABS: ORAL_TABLET | ORAL | 0 refills | Status: DC

## 2021-04-12 MED ORDER — HYDROCHLOROTHIAZIDE 25 MG PO TABS: 25.0000 mg | ORAL_TABLET | Freq: Every day | ORAL | 0 refills | Status: DC

## 2021-04-12 MED ORDER — METOPROLOL SUCCINATE ER 100 MG PO TB24: ORAL_TABLET | ORAL | 1 refills | Status: DC

## 2021-04-12 MED ORDER — FAMOTIDINE 40 MG PO TABS: 40.0000 mg | ORAL_TABLET | Freq: Every day | ORAL | 0 refills | Status: DC

## 2021-04-12 MED ORDER — METFORMIN HCL 500 MG PO TABS: ORAL_TABLET | ORAL | 1 refills | Status: DC

## 2021-04-12 NOTE — Telephone Encounter (Signed)
Please advise. Thank you

## 2021-04-12 NOTE — Telephone Encounter (Signed)
Meds sent to [facility] as requested. Cancelled meds at [facility]. Pt contacted and verbalized understanding

## 2021-04-12 NOTE — Telephone Encounter (Signed)
Patient called in states she needs all of the medication that was sent in yesterday sent into [facility] mail order not her local pharmacy.   CB# [phone]

## 2021-04-13 MED ORDER — GLIPIZIDE ER 5 MG PO TB24: 10.0000 mg | ORAL_TABLET | Freq: Every morning | ORAL | 1 refills | Status: DC

## 2021-05-04 ENCOUNTER — Other Ambulatory Visit: Payer: Self-pay | Admitting: Orthopedic Surgery

## 2021-05-04 DIAGNOSIS — M48062 Spinal stenosis, lumbar region with neurogenic claudication: Secondary | ICD-10-CM

## 2021-05-16 ENCOUNTER — Ambulatory Visit (INDEPENDENT_AMBULATORY_CARE_PROVIDER_SITE_OTHER): Payer: Medicare Other | Admitting: Nurse Practitioner

## 2021-05-16 ENCOUNTER — Other Ambulatory Visit: Payer: Self-pay

## 2021-05-16 ENCOUNTER — Encounter: Payer: Self-pay | Admitting: Nurse Practitioner

## 2021-05-16 VITALS — BP 149/76 | HR 64 | Ht 63.0 in | Wt 203.6 lb

## 2021-05-16 DIAGNOSIS — E039 Hypothyroidism, unspecified: Secondary | ICD-10-CM

## 2021-05-16 DIAGNOSIS — E782 Mixed hyperlipidemia: Secondary | ICD-10-CM

## 2021-05-16 DIAGNOSIS — I10 Essential (primary) hypertension: Secondary | ICD-10-CM | POA: Diagnosis not present

## 2021-05-16 DIAGNOSIS — E1121 Type 2 diabetes mellitus with diabetic nephropathy: Secondary | ICD-10-CM

## 2021-05-16 MED ORDER — METFORMIN HCL ER (OSM) 1000 MG PO TB24: 1000.0000 mg | ORAL_TABLET | Freq: Every day | ORAL | 1 refills | Status: DC

## 2021-05-16 MED ORDER — GLIPIZIDE ER 5 MG PO TB24: 5.0000 mg | ORAL_TABLET | Freq: Every day | ORAL | 1 refills | Status: DC

## 2021-05-16 NOTE — Patient Instructions (Signed)
Diabetes Mellitus and Nutrition, Adult When you have diabetes, or diabetes mellitus, it is very important to have healthy eating habits because your blood sugar (glucose) levels are greatly affected by what you eat and drink. Eating healthy foods in the right amounts, at about the same times every day, can help you:  Control your blood glucose.  Lower your risk of heart disease.  Improve your blood pressure.  Reach or maintain a healthy weight. What can affect my meal plan? Every person with diabetes is different, and each person has different needs for a meal plan. Your health care provider may recommend that you work with a dietitian to make a meal plan that is best for you. Your meal plan may vary depending on factors such as:  The calories you need.  The medicines you take.  Your weight.  Your blood glucose, blood pressure, and cholesterol levels.  Your activity level.  Other health conditions you have, such as heart or kidney disease. How do carbohydrates affect me? Carbohydrates, also called carbs, affect your blood glucose level more than any other type of food. Eating carbs naturally raises the amount of glucose in your blood. Carb counting is a method for keeping track of how many carbs you eat. Counting carbs is important to keep your blood glucose at a healthy level, especially if you use insulin or take certain oral diabetes medicines. It is important to know how many carbs you can safely have in each meal. This is different for every person. Your dietitian can help you calculate how many carbs you should have at each meal and for each snack. How does alcohol affect me? Alcohol can cause a sudden decrease in blood glucose (hypoglycemia), especially if you use insulin or take certain oral diabetes medicines. Hypoglycemia can be a life-threatening condition. Symptoms of hypoglycemia, such as sleepiness, dizziness, and confusion, are similar to symptoms of having too much  alcohol.  Do not drink alcohol if: ? Your health care provider tells you not to drink. ? You are pregnant, may be pregnant, or are planning to become pregnant.  If you drink alcohol: ? Do not drink on an empty stomach. ? Limit how much you use to:  0-1 drink a day for women.  0-2 drinks a day for men. ? Be aware of how much alcohol is in your drink. In the [place] one drink equals one 12 oz bottle of beer (355 mL), one 5 oz glass of wine (148 mL), or one 1 oz glass of hard liquor (44 mL). ? Keep yourself hydrated with water, diet soda, or unsweetened iced tea.  Keep in mind that regular soda, juice, and other mixers may contain a lot of sugar and must be counted as carbs. What are tips for following this plan? Reading food labels  Start by checking the serving size on the "Nutrition Facts" label of packaged foods and drinks. The amount of calories, carbs, fats, and other nutrients listed on the label is based on one serving of the item. Many items contain more than one serving per package.  Check the total grams (g) of carbs in one serving. You can calculate the number of servings of carbs in one serving by dividing the total carbs by 15. For example, if a food has 30 g of total carbs per serving, it would be equal to 2 servings of carbs.  Check the number of grams (g) of saturated fats and trans fats in one serving. Choose foods that have   a low amount or none of these fats.  Check the number of milligrams (mg) of salt (sodium) in one serving. Most people should limit total sodium intake to less than 2,300 mg per day.  Always check the nutrition information of foods labeled as "low-fat" or "nonfat." These foods may be higher in added sugar or refined carbs and should be avoided.  Talk to your dietitian to identify your daily goals for nutrients listed on the label. Shopping  Avoid buying canned, pre-made, or processed foods. These foods tend to be high in fat, sodium, and added  sugar.  Shop around the outside edge of the grocery store. This is where you will most often find fresh fruits and vegetables, bulk grains, fresh meats, and fresh dairy. Cooking  Use low-heat cooking methods, such as baking, instead of high-heat cooking methods like deep frying.  Cook using healthy oils, such as olive, canola, or sunflower oil.  Avoid cooking with butter, cream, or high-fat meats. Meal planning  Eat meals and snacks regularly, preferably at the same times every day. Avoid going long periods of time without eating.  Eat foods that are high in fiber, such as fresh fruits, vegetables, beans, and whole grains. Talk with your dietitian about how many servings of carbs you can eat at each meal.  Eat 4-6 oz (112-168 g) of lean protein each day, such as lean meat, chicken, fish, eggs, or tofu. One ounce (oz) of lean protein is equal to: ? 1 oz (28 g) of meat, chicken, or fish. ? 1 egg. ?  cup (62 g) of tofu.  Eat some foods each day that contain healthy fats, such as avocado, nuts, seeds, and fish.   What foods should I eat? Fruits Berries. Apples. Oranges. Peaches. Apricots. Plums. Grapes. Mango. Papaya. Pomegranate. Kiwi. Cherries. Vegetables Lettuce. Spinach. Leafy greens, including kale, chard, collard greens, and mustard greens. Beets. Cauliflower. Cabbage. Broccoli. Carrots. Green beans. Tomatoes. Peppers. Onions. Cucumbers. Brussels sprouts. Grains Whole grains, such as whole-wheat or whole-grain bread, crackers, tortillas, cereal, and pasta. Unsweetened oatmeal. Quinoa. Brown or wild rice. Meats and other proteins Seafood. Poultry without skin. Lean cuts of poultry and beef. Tofu. Nuts. Seeds. Dairy Low-fat or fat-free dairy products such as milk, yogurt, and cheese. The items listed above may not be a complete list of foods and beverages you can eat. Contact a dietitian for more information. What foods should I avoid? Fruits Fruits canned with  syrup. Vegetables Canned vegetables. Frozen vegetables with butter or cream sauce. Grains Refined white flour and flour products such as bread, pasta, snack foods, and cereals. Avoid all processed foods. Meats and other proteins Fatty cuts of meat. Poultry with skin. Breaded or fried meats. Processed meat. Avoid saturated fats. Dairy Full-fat yogurt, cheese, or milk. Beverages Sweetened drinks, such as soda or iced tea. The items listed above may not be a complete list of foods and beverages you should avoid. Contact a dietitian for more information. Questions to ask a health care provider  Do I need to meet with a diabetes educator?  Do I need to meet with a dietitian?  What number can I call if I have questions?  When are the best times to check my blood glucose? Where to find more information:  American Diabetes Association: diabetes.org  Academy of Nutrition and Dietetics: www.eatright.org  National Institute of Diabetes and Digestive and Kidney Diseases: www.niddk.nih.gov  Association of Diabetes Care and Education Specialists: www.diabeteseducator.org Summary  It is important to have healthy eating   habits because your blood sugar (glucose) levels are greatly affected by what you eat and drink.  A healthy meal plan will help you control your blood glucose and maintain a healthy lifestyle.  Your health care provider may recommend that you work with a dietitian to make a meal plan that is best for you.  Keep in mind that carbohydrates (carbs) and alcohol have immediate effects on your blood glucose levels. It is important to count carbs and to use alcohol carefully. This information is not intended to replace advice given to you by your health care provider. Make sure you discuss any questions you have with your health care provider. Document Revised: 07/22/2019 Document Reviewed: 07/22/2019 Elsevier Patient Education  2021 Elsevier Inc.  

## 2021-05-16 NOTE — Progress Notes (Addendum)
Endocrinology Consult Note       05/16/2021, 10:47 AM   Subjective:    Patient ID: Kimberly [name], female    DOB: 17-Jun-1951.  KANANI MOWBRAY is being seen in consultation for management of currently uncontrolled symptomatic diabetes requested by  Erven Colla, DO.   Past Medical History:  Diagnosis Date   Anxiety    Arthritis    Depression    Diabetes mellitus    GERD (gastroesophageal reflux disease)    High blood pressure    Hypothyroid    Obesity    PONV (postoperative nausea and vomiting)     Past Surgical History:  Procedure Laterality Date   ABDOMINAL HYSTERECTOMY     Bunions Right    CARPAL TUNNEL RELEASE Right    KNEE ARTHROSCOPY Right    THYROID SURGERY     removal   TOTAL KNEE ARTHROPLASTY Right 08/25/2013   Procedure: TOTAL KNEE ARTHROPLASTY;  Surgeon: Carole Civil, MD;  Location: [facility];  Service: Orthopedics;  Laterality: Right;   TOTAL KNEE ARTHROPLASTY Left 09/09/2014   Procedure: LEFT TOTAL KNEE ARTHROPLASTY;  Surgeon: Carole Civil, MD;  Location: [facility];  Service: Orthopedics;  Laterality: Left;    Social History   Socioeconomic History   Marital status: Married    Spouse name: Not on file   Number of children: 5   Years of education: Not on file   Highest education level: Not on file  Occupational History   Occupation: CNA    Employer: [employer]  Tobacco Use   Smoking status: Never   Smokeless tobacco: Never  Vaping Use   Vaping Use: Never used  Substance and Sexual Activity   Alcohol use: No   Drug use: No   Sexual activity: Yes    Birth control/protection: Surgical  Other Topics Concern   Not on file  Social History Narrative   Not on file   Social Determinants of Health   Financial Resource Strain: Not on file  Food Insecurity: Not on file  Transportation Needs: Not on file  Physical Activity: Not on file  Stress: Not on file   Social Connections: Not on file    Family History  Problem Relation Age of Onset   Congestive Heart Failure Father    Hypertension Father    Diabetes Mother    Hypertension Mother    Hypertension Brother    Cancer Other    Diabetes Other     Outpatient Encounter Medications as of 05/16/2021  Medication Sig   diclofenac Sodium (VOLTAREN) 1 % GEL Apply topically 4 (four) times daily. OTC   [START ON 05/28/2021] famotidine (PEPCID) 40 MG tablet Take 1 tablet (40 mg total) by mouth daily. (Patient taking differently: Take 40 mg by mouth every morning.)   gabapentin (NEURONTIN) 100 MG capsule TAKE 1 CAPSULE 3 TIMES A   DAY   glipiZIDE (GLUCOTROL XL) 5 MG 24 hr tablet Take 1 tablet (5 mg total) by mouth daily with breakfast.   hydrochlorothiazide (HYDRODIURIL) 25 MG tablet Take 1 tablet (25 mg total) by mouth daily.   levothyroxine (SYNTHROID) 125 MCG tablet TAKE 1 TABLET ONCE DAILY   BEFORE  BREAKFAST   linaclotide (LINZESS) 290 MCG CAPS capsule Take 290 mcg by mouth daily before breakfast.   losartan (COZAAR) 100 MG tablet Take 1 tablet (100 mg total) by mouth every morning.   metformin (FORTAMET) 1000 MG (OSM) 24 hr tablet Take 1 tablet (1,000 mg total) by mouth daily with breakfast.   metoprolol succinate (TOPROL-XL) 100 MG 24 hr tablet TAKE 1 TABLET AT BEDTIME   WITH OR IMMEDIATELY        FOLLOWING A MEAL   omeprazole (PRILOSEC) 20 MG capsule Take one capsule po qhs (Patient taking differently: Take 20 mg by mouth at bedtime. Take one capsule po qhs)   Probiotic Product (PROBIOTIC-10 PO) Take by mouth.   tolterodine (DETROL LA) 4 MG 24 hr capsule Take 1 capsule (4 mg total) by mouth daily.   traMADol (ULTRAM) 50 MG tablet Two tablets po QHS prn pain   venlafaxine XR (EFFEXOR-XR) 75 MG 24 hr capsule TAKE 1 CAPSULE DAILY WITH  BREAKFAST   [DISCONTINUED] glipiZIDE (GLUCOTROL XL) 5 MG 24 hr tablet Take 2 tablets (10 mg total) by mouth every morning.   [DISCONTINUED] metFORMIN (GLUCOPHAGE)  500 MG tablet TAKE 2 TABLETS 2 TIMES     DAILY WITH MEALS (NEW      DIRECTIONS)  APPT NEEDED FOR FUTURE REFILLS   aspirin 81 MG tablet Take 81 mg by mouth daily. (Patient not taking: No sig reported)   Facility-Administered Encounter Medications as of 05/16/2021  Medication   bupivacaine liposome (EXPAREL) 1.3 % injection 266 mg    ALLERGIES: Allergies  Allergen Reactions   Ace Inhibitors Cough    VACCINATION STATUS: Immunization History  Administered Date(s) Administered   Fluad Quad(high Dose 65+) 07/12/2020   Influenza,inj,Quad PF,6+ Mos 04/30/2018   Influenza-Unspecified 05/21/2016, 05/03/2017, 06/21/2019   Moderna Sars-Covid-2 Vaccination 10/07/2019, 11/05/2019, 08/02/2020   Pneumococcal Conjugate-13 03/03/2014, 05/30/2017   Pneumococcal Polysaccharide-23 05/27/2010    Diabetes She presents for her initial diabetic visit. She has type 2 diabetes mellitus. Onset time: Diagnosed at approx age of 29. Her disease course has been fluctuating. There are no hypoglycemic associated symptoms. Pertinent negatives for hypoglycemia include no nervousness/anxiousness or tremors. Associated symptoms include fatigue, foot paresthesias and weight loss. Pertinent negatives for diabetes include no polydipsia and no polyuria. There are no hypoglycemic complications. Symptoms are stable. Diabetic complications include nephropathy and peripheral neuropathy. Risk factors for coronary artery disease include diabetes mellitus, dyslipidemia, family history, obesity, post-menopausal and hypertension. Current diabetic treatment includes oral agent (dual therapy) (Currently on Metformin 1000 mg BID; Glip 10 mg XL). She is compliant with treatment most of the time. Her weight is decreasing steadily. She is following a generally healthy diet. Meal planning includes avoidance of concentrated sweets. She has had a previous visit with a dietitian. She participates in exercise three times a week. (She presents today for  her consultation with no meter or logs to review. Her most recent A1c was 7.6% on 04/11/21.  She does not routinely monitor glucose anymore, says her fasting readings are usually in the 160s.  She drinks only water and coffee, and typically eats 2 meals per day with occasional snacks.  She does engage in routine physical activity at least 3 days per week (water aerobics).  She denies any s/s of hypoglycemia.) An ACE inhibitor/angiotensin II receptor blocker is being taken. She sees a podiatrist (has seen one in the distant past).Eye exam is current.  Hyperlipidemia This is a chronic problem. The current episode started more than 1  year ago. The problem is uncontrolled. Recent lipid tests were reviewed and are variable. Exacerbating diseases include chronic renal disease, diabetes, hypothyroidism and obesity. Factors aggravating her hyperlipidemia include thiazides and beta blockers. Current antihyperlipidemic treatment includes diet change and exercise. The current treatment provides mild improvement of lipids. There are no compliance problems.  Risk factors for coronary artery disease include diabetes mellitus, dyslipidemia, family history, hypertension, obesity and post-menopausal.  Hypertension This is a chronic problem. The current episode started more than 1 year ago. The problem has been gradually improving since onset. The problem is uncontrolled. Pertinent negatives include no palpitations. Agents associated with hypertension include thyroid hormones. Risk factors for coronary artery disease include diabetes mellitus, dyslipidemia, family history, obesity and post-menopausal state. Past treatments include angiotensin blockers, beta blockers and diuretics. The current treatment provides moderate improvement. There are no compliance problems.  Hypertensive end-organ damage includes kidney disease. Identifiable causes of hypertension include chronic renal disease and a thyroid problem.  Thyroid  Problem Presents for initial visit. Symptoms include fatigue and weight loss. Patient reports no anxiety, cold intolerance, constipation, depressed mood, heat intolerance, leg swelling, palpitations, tremors or weight gain. The symptoms have been stable. Past treatments include levothyroxine. The treatment provided significant relief. Her past medical history is significant for diabetes, hyperlipidemia and neuropathy (? vs chronic back issues).    Review of systems  Constitutional: + steadily decreasing body weight-intentional, current Body mass index is 36.07 kg/m., + fatigue, no subjective hyperthermia, no subjective hypothermia Eyes: no blurry vision, no xerophthalmia ENT: no sore throat, no nodules palpated in throat, no dysphagia/odynophagia, no hoarseness Cardiovascular: no chest pain, no shortness of breath, no palpitations, no leg swelling Respiratory: no cough, no shortness of breath Gastrointestinal: no nausea/vomiting/diarrhea Musculoskeletal: no muscle/joint aches Skin: no rashes, no hyperemia Neurological: no tremors, + numbness/tingling to BLE, no dizziness Psychiatric: no depression, no anxiety  Objective:     BP (!) 149/76   Pulse 64   Ht 5\' 3"  (1.6 m)   Wt 203 lb 9.6 oz (92.4 kg)   BMI 36.07 kg/m   Wt Readings from Last 3 Encounters:  05/16/21 203 lb 9.6 oz (92.4 kg)  04/11/21 204 lb 9.6 oz (92.8 kg)  11/29/20 200 lb (90.7 kg)     BP Readings from Last 3 Encounters:  05/16/21 (!) 149/76  04/11/21 132/86  11/29/20 (!) 152/79     Physical Exam- Limited  Constitutional:  Body mass index is 36.07 kg/m. , not in acute distress, normal state of mind Eyes:  EOMI, no exophthalmos Neck: Supple Cardiovascular: RRR, no murmurs, rubs, or gallops, no edema Respiratory: Adequate breathing efforts, no crackles, rales, rhonchi, or wheezing Musculoskeletal: no gross deformities, strength intact in all four extremities, no gross restriction of joint movements Skin:  no  rashes, no hyperemia Neurological: no tremor with outstretched hands    CMP ( most recent) CMP     Component Value Date/Time   NA 138 04/11/2021 1042   K 4.0 04/11/2021 1042   CL 98 04/11/2021 1042   CO2 21 04/11/2021 1042   GLUCOSE 173 (H) 04/11/2021 1042   GLUCOSE 163 (H) 09/10/2014 0620   BUN 20 04/11/2021 1042   CREATININE 0.97 04/11/2021 1042   CREATININE 1.06 08/11/2014 0701   CALCIUM 10.1 04/11/2021 1042   PROT 7.8 04/11/2021 1042   ALBUMIN 5.2 (H) 04/11/2021 1042   AST 26 04/11/2021 1042   ALT 35 (H) 04/11/2021 1042   ALKPHOS 82 04/11/2021 1042   BILITOT 0.4 04/11/2021  [place] (L) 10/11/2020 1146   GFRAA 65 10/11/2020 1146     Diabetic Labs (most recent): Lab Results  Component Value Date   HGBA1C 7.6 (H) 04/11/2021   HGBA1C 7.3 (H) 10/11/2020   HGBA1C 8.9 (H) 07/12/2020     Lipid Panel ( most recent) Lipid Panel     Component Value Date/Time   CHOL 163 04/11/2021 1042   TRIG 253 (H) 04/11/2021 1042   HDL 42 04/11/2021 1042   CHOLHDL 3.9 04/11/2021 1042   CHOLHDL 3.7 06/03/2014 0709   VLDL 40 06/03/2014 0709   LDLCALC 79 04/11/2021 1042   LABVLDL 42 (H) 04/11/2021 1042      Lab Results  Component Value Date   TSH 0.653 04/11/2021   TSH 1.490 04/19/2020   TSH 2.470 07/31/2019   TSH 0.528 04/22/2018   TSH 0.256 (L) 12/26/2017   TSH 1.030 07/08/2015   TSH 3.790 11/11/2014   TSH 0.177 (L) 06/03/2014   TSH 1.697 07/09/2013           Assessment & Plan:   1) Type 2 diabetes mellitus with diabetic nephropathy, without long-term current use of insulin ([place])  She presents today for her consultation with no meter or logs to review. Her most recent A1c was 7.6% on 04/11/21.  She does not routinely monitor glucose anymore, says her fasting readings are usually in the 160s.  She drinks only water and coffee, and typically eats 2 meals per day with occasional snacks.  She does engage in routine physical activity at least 3 days per week  (water aerobics).  She denies any s/s of hypoglycemia.  - Kimberly [name] has currently uncontrolled symptomatic type 2 DM since 70 years of age, with most recent A1c of 7.6 %.   -Recent labs reviewed.  - I had a long discussion with her about the progressive nature of diabetes and the pathology behind its complications. -her diabetes is complicated by mild CKD, peripheral neuropathy and she remains at a high risk for more acute and chronic complications which include CAD, CVA, CKD, retinopathy, and neuropathy. These are all discussed in detail with her.  - I have counseled her on diet and weight management by adopting a carbohydrate restricted/protein rich diet. Patient is encouraged to switch to unprocessed or minimally processed complex starch and increased protein intake (animal or plant source), fruits, and vegetables. -  she is advised to stick to a routine mealtimes to eat 3 meals a day and avoid unnecessary snacks (to snack only to correct hypoglycemia).   - she acknowledges that there is a room for improvement in her food and drink choices. - Suggestion is made for her to avoid simple carbohydrates from her diet including Cakes, Sweet Desserts, Ice Cream, Soda (diet and regular), Sweet Tea, Candies, Chips, Cookies, Store Bought Juices, Alcohol in Excess of 1-2 drinks a day, Artificial Sweeteners, Coffee Creamer, and "Sugar-free" Products. This will help patient to have more stable blood glucose profile and potentially avoid unintended weight gain.  - she declines referral to Jearld Fenton, RDN, CDE for diabetes education at this time.  - I have approached her with the following individualized plan to manage her diabetes and patient agrees:   -she is encouraged to start monitoring glucose 4 times daily, before meals and before bed, to log their readings on the clinic sheets provided, and bring them to review at follow up appointment in 2 weeks.  - Adjustment parameters are given to  her  for hypo and hyperglycemia in writing. - she is encouraged to call clinic for blood glucose levels less than 70 or above 300 mg /dl. - she is advised to change her Metformin to 1000 mg ER daily after breakfast (given her significant GI side effects) and lower her dose of Glipizide to 5 mg XL daily with breakfast, therapeutically suitable for patient .  - she will be considered for incretin therapy as appropriate next visit.  - Specific targets for  A1c; LDL, HDL, and Triglycerides were discussed with the patient.  2) Blood Pressure /Hypertension:  her blood pressure is not controlled to target.   she is advised to continue her current medications including HCTZ 25 mg po daily, Losartan 100 mg p.o. daily with breakfast, and Metoprolol 100 mg po daily.  3) Lipids/Hyperlipidemia:    Review of her recent lipid panel from 04/11/21 showed controlled LDL at 79 and elevated triglycerides of 253.  She prefers to stay away from statins at this time.  She is advised to avoid fried foods and butter.   4)  Weight/Diet:  her Body mass index is 36.07 kg/m.  -  clearly complicating her diabetes care.   she is a candidate for weight loss. I discussed with her the fact that loss of 5 - 10% of her  current body weight will have the most impact on her diabetes management.  Exercise, and detailed carbohydrates information provided  -  detailed on discharge instructions.  5) Hypothyroidism-unspecified Her most recent TFTs are consistent with appropriate hormone replacement.  She is advised to continue Levothyroxine 125 mcg po daily before breakfast.   - The correct intake of thyroid hormone (Levothyroxine, Synthroid), is on empty stomach first thing in the morning, with water, separated by at least 30 minutes from breakfast and other medications,  and separated by more than 4 hours from calcium, iron, multivitamins, acid reflux medications (PPIs).  - This medication is a life-long medication and will be needed  to correct thyroid hormone imbalances for the rest of your life.  The dose may change from time to time, based on thyroid blood work.  - It is extremely important to be consistent taking this medication, near the same time each morning.  -AVOID TAKING PRODUCTS CONTAINING BIOTIN (commonly found in Hair, Skin, Nails vitamins) AS IT INTERFERES WITH THE VALIDITY OF THYROID FUNCTION BLOOD TESTS.  6) Chronic Care/Health Maintenance: -she is on ACEI/ARB medications and is encouraged to initiate and continue to follow up with Ophthalmology, Dentist, Podiatrist at least yearly or according to recommendations, and advised to stay away from smoking. I have recommended yearly flu vaccine and pneumonia vaccine at least every 5 years; moderate intensity exercise for up to 150 minutes weekly; and sleep for at least 7 hours a day.  - she is advised to maintain close follow up with Erven Colla, DO for primary care needs, as well as her other providers for optimal and coordinated care.   - Time spent in this patient care: 60 min, of which > 50% was spent in counseling her about her diabetes and the rest reviewing her blood glucose logs, discussing her hypoglycemia and hyperglycemia episodes, reviewing her current and previous labs/studies (including abstraction from other facilities) and medications doses and developing a long term treatment plan based on the latest standards of care/guidelines; and documenting her care.    Please refer to Patient Instructions for Blood Glucose Monitoring and Insulin/Medications Dosing Guide" in media tab for additional information. Please also  refer to "Patient Self Inventory" in the Media tab for reviewed elements of pertinent patient history.  Kimberly [name] participated in the discussions, expressed understanding, and voiced agreement with the above plans.  All questions were answered to her satisfaction. she is encouraged to contact clinic should she have any questions or  concerns prior to her return visit.     Follow up plan: - Return in about 2 weeks (around 05/30/2021) for Diabetes F/U, Bring meter and logs.    Rayetta Pigg, [facility] [address] [place], [place] [address] Phone: [phone] Fax: [phone]  05/16/2021, 10:47 AM

## 2021-05-26 ENCOUNTER — Other Ambulatory Visit (HOSPITAL_COMMUNITY): Payer: Self-pay | Admitting: Family Medicine

## 2021-05-26 DIAGNOSIS — Z1231 Encounter for screening mammogram for malignant neoplasm of breast: Secondary | ICD-10-CM

## 2021-05-30 NOTE — Patient Instructions (Signed)

## 2021-05-31 ENCOUNTER — Ambulatory Visit (INDEPENDENT_AMBULATORY_CARE_PROVIDER_SITE_OTHER): Payer: Medicare Other | Admitting: Nurse Practitioner

## 2021-05-31 ENCOUNTER — Encounter: Payer: Self-pay | Admitting: Nurse Practitioner

## 2021-05-31 ENCOUNTER — Other Ambulatory Visit: Payer: Self-pay

## 2021-05-31 VITALS — BP 148/76 | HR 67 | Ht 63.0 in | Wt 204.6 lb

## 2021-05-31 DIAGNOSIS — E039 Hypothyroidism, unspecified: Secondary | ICD-10-CM | POA: Diagnosis not present

## 2021-05-31 DIAGNOSIS — I10 Essential (primary) hypertension: Secondary | ICD-10-CM | POA: Diagnosis not present

## 2021-05-31 DIAGNOSIS — E782 Mixed hyperlipidemia: Secondary | ICD-10-CM | POA: Diagnosis not present

## 2021-05-31 DIAGNOSIS — E1121 Type 2 diabetes mellitus with diabetic nephropathy: Secondary | ICD-10-CM

## 2021-05-31 NOTE — Progress Notes (Signed)
Endocrinology Follow Up Note       05/31/2021, 9:33 AM   Subjective:    Patient ID: Kimberly [name], female    DOB: 25-Jul-1951.  Kimberly [name] is being seen in follow up after being seen in consultation for management of currently uncontrolled symptomatic diabetes requested by  Erven Colla, DO.   Past Medical History:  Diagnosis Date   Anxiety    Arthritis    Depression    Diabetes mellitus    GERD (gastroesophageal reflux disease)    High blood pressure    Hypothyroid    Obesity    PONV (postoperative nausea and vomiting)     Past Surgical History:  Procedure Laterality Date   ABDOMINAL HYSTERECTOMY     Bunions Right    CARPAL TUNNEL RELEASE Right    KNEE ARTHROSCOPY Right    THYROID SURGERY     removal   TOTAL KNEE ARTHROPLASTY Right 08/25/2013   Procedure: TOTAL KNEE ARTHROPLASTY;  Surgeon: Carole Civil, MD;  Location: [facility];  Service: Orthopedics;  Laterality: Right;   TOTAL KNEE ARTHROPLASTY Left 09/09/2014   Procedure: LEFT TOTAL KNEE ARTHROPLASTY;  Surgeon: Carole Civil, MD;  Location: [facility];  Service: Orthopedics;  Laterality: Left;    Social History   Socioeconomic History   Marital status: Married    Spouse name: Not on file   Number of children: 5   Years of education: Not on file   Highest education level: Not on file  Occupational History   Occupation: CNA    Employer: [employer]  Tobacco Use   Smoking status: Never   Smokeless tobacco: Never  Vaping Use   Vaping Use: Never used  Substance and Sexual Activity   Alcohol use: No   Drug use: No   Sexual activity: Yes    Birth control/protection: Surgical  Other Topics Concern   Not on file  Social History Narrative   Not on file   Social Determinants of Health   Financial Resource Strain: Not on file  Food Insecurity: Not on file  Transportation Needs: Not on file  Physical Activity: Not on  file  Stress: Not on file  Social Connections: Not on file    Family History  Problem Relation Age of Onset   Congestive Heart Failure Father    Hypertension Father    Diabetes Mother    Hypertension Mother    Hypertension Brother    Cancer Other    Diabetes Other     Outpatient Encounter Medications as of 05/31/2021  Medication Sig   diclofenac Sodium (VOLTAREN) 1 % GEL Apply topically 4 (four) times daily. OTC   famotidine (PEPCID) 40 MG tablet Take 1 tablet (40 mg total) by mouth daily. (Patient taking differently: Take 40 mg by mouth every morning.)   gabapentin (NEURONTIN) 100 MG capsule TAKE 1 CAPSULE 3 TIMES A   DAY   glipiZIDE (GLUCOTROL XL) 5 MG 24 hr tablet Take 1 tablet (5 mg total) by mouth daily with breakfast.   hydrochlorothiazide (HYDRODIURIL) 25 MG tablet Take 1 tablet (25 mg total) by mouth daily.   levothyroxine (SYNTHROID) 125 MCG tablet TAKE 1 TABLET ONCE  DAILY   BEFORE BREAKFAST   linaclotide (LINZESS) 290 MCG CAPS capsule Take 290 mcg by mouth daily before breakfast.   losartan (COZAAR) 100 MG tablet Take 1 tablet (100 mg total) by mouth every morning.   metFORMIN (GLUCOPHAGE-XR) 500 MG 24 hr tablet Take 1,000 mg by mouth daily with breakfast.   metoprolol succinate (TOPROL-XL) 100 MG 24 hr tablet TAKE 1 TABLET AT BEDTIME   WITH OR IMMEDIATELY        FOLLOWING A MEAL   omeprazole (PRILOSEC) 20 MG capsule Take one capsule po qhs (Patient taking differently: Take 20 mg by mouth at bedtime. Take one capsule po qhs)   Probiotic Product (PROBIOTIC-10 PO) Take by mouth.   tolterodine (DETROL LA) 4 MG 24 hr capsule Take 1 capsule (4 mg total) by mouth daily.   traMADol (ULTRAM) 50 MG tablet Two tablets po QHS prn pain   venlafaxine XR (EFFEXOR-XR) 75 MG 24 hr capsule TAKE 1 CAPSULE DAILY WITH  BREAKFAST   aspirin 81 MG tablet Take 81 mg by mouth daily. (Patient not taking: No sig reported)   metformin (FORTAMET) 1000 MG (OSM) 24 hr tablet Take 1 tablet (1,000 mg  total) by mouth daily with breakfast. (Patient not taking: Reported on 05/31/2021)   Facility-Administered Encounter Medications as of 05/31/2021  Medication   bupivacaine liposome (EXPAREL) 1.3 % injection 266 mg    ALLERGIES: Allergies  Allergen Reactions   Ace Inhibitors Cough    VACCINATION STATUS: Immunization History  Administered Date(s) Administered   Fluad Quad(high Dose 65+) 07/12/2020   Influenza,inj,Quad PF,6+ Mos 04/30/2018   Influenza-Unspecified 05/21/2016, 05/03/2017, 06/21/2019   Moderna Sars-Covid-2 Vaccination 10/07/2019, 11/05/2019, 08/02/2020   Pneumococcal Conjugate-13 03/03/2014, 05/30/2017   Pneumococcal Polysaccharide-23 05/27/2010    Diabetes She presents for her follow-up diabetic visit. She has type 2 diabetes mellitus. Onset time: Diagnosed at approx age of 89. Her disease course has been improving. There are no hypoglycemic associated symptoms. Pertinent negatives for hypoglycemia include no nervousness/anxiousness or tremors. Associated symptoms include fatigue and foot paresthesias. Pertinent negatives for diabetes include no polydipsia, no polyuria and no weight loss. There are no hypoglycemic complications. Symptoms are stable. Diabetic complications include nephropathy and peripheral neuropathy. Risk factors for coronary artery disease include diabetes mellitus, dyslipidemia, family history, obesity, post-menopausal and hypertension. Current diabetic treatment includes oral agent (dual therapy). She is compliant with treatment most of the time. Her weight is stable. She is following a generally healthy diet. Meal planning includes avoidance of concentrated sweets. She has had a previous visit with a dietitian. She participates in exercise three times a week. Her home blood glucose trend is fluctuating minimally. Her breakfast blood glucose range is generally 140-180 mg/dl. Her lunch blood glucose range is generally 140-180 mg/dl. Her dinner blood glucose  range is generally 140-180 mg/dl. Her bedtime blood glucose range is generally 140-180 mg/dl. (She presents today with her meter and logs showing slightly above target fasting and at goal postprandial glycemic profile.  She was not due for another A1c today.  She reports she still struggles to eat a meal at supper.  Her biggest meal is lunch.  She denies any hypoglycemia.) An ACE inhibitor/angiotensin II receptor blocker is being taken. She sees a podiatrist (has seen one in the distant past).Eye exam is current.  Hyperlipidemia This is a chronic problem. The current episode started more than 1 year ago. The problem is uncontrolled. Recent lipid tests were reviewed and are variable. Exacerbating diseases include chronic renal disease,  diabetes, hypothyroidism and obesity. Factors aggravating her hyperlipidemia include thiazides and beta blockers. Current antihyperlipidemic treatment includes diet change and exercise. The current treatment provides mild improvement of lipids. There are no compliance problems.  Risk factors for coronary artery disease include diabetes mellitus, dyslipidemia, family history, hypertension, obesity and post-menopausal.  Hypertension This is a chronic problem. The current episode started more than 1 year ago. The problem has been gradually improving since onset. The problem is uncontrolled. Pertinent negatives include no palpitations. Agents associated with hypertension include thyroid hormones. Risk factors for coronary artery disease include diabetes mellitus, dyslipidemia, family history, obesity and post-menopausal state. Past treatments include angiotensin blockers, beta blockers and diuretics. The current treatment provides moderate improvement. There are no compliance problems.  Hypertensive end-organ damage includes kidney disease. Identifiable causes of hypertension include chronic renal disease and a thyroid problem.  Thyroid Problem Presents for initial visit. Symptoms  include fatigue. Patient reports no anxiety, cold intolerance, constipation, depressed mood, heat intolerance, leg swelling, palpitations, tremors, weight gain or weight loss. The symptoms have been stable. Past treatments include levothyroxine. The treatment provided significant relief. Her past medical history is significant for diabetes, hyperlipidemia and neuropathy (? vs chronic back issues).    Review of systems  Constitutional: + stable body weight, current Body mass index is 36.24 kg/m., + fatigue, no subjective hyperthermia, no subjective hypothermia Eyes: no blurry vision, no xerophthalmia ENT: no sore throat, no nodules palpated in throat, no dysphagia/odynophagia, no hoarseness Cardiovascular: no chest pain, no shortness of breath, no palpitations, no leg swelling Respiratory: no cough, no shortness of breath Gastrointestinal: no nausea/vomiting/diarrhea Musculoskeletal: no muscle/joint aches Skin: no rashes, no hyperemia Neurological: no tremors, + numbness/tingling to BLE, no dizziness Psychiatric: no depression, no anxiety  Objective:     BP (!) 148/76   Pulse 67   Ht 5\' 3"  (1.6 m)   Wt 204 lb 9.6 oz (92.8 kg)   BMI 36.24 kg/m   Wt Readings from Last 3 Encounters:  05/31/21 204 lb 9.6 oz (92.8 kg)  05/16/21 203 lb 9.6 oz (92.4 kg)  04/11/21 204 lb 9.6 oz (92.8 kg)     BP Readings from Last 3 Encounters:  05/31/21 (!) 148/76  05/16/21 (!) 149/76  04/11/21 132/86      Physical Exam- Limited  Constitutional:  Body mass index is 36.24 kg/m. , not in acute distress, normal state of mind Eyes:  EOMI, no exophthalmos Neck: Supple Cardiovascular: RRR, no murmurs, rubs, or gallops, no edema Respiratory: Adequate breathing efforts, no crackles, rales, rhonchi, or wheezing Musculoskeletal: no gross deformities, strength intact in all four extremities, no gross restriction of joint movements Skin:  no rashes, no hyperemia Neurological: no tremor with outstretched  hands    CMP ( most recent) CMP     Component Value Date/Time   NA 138 04/11/2021 1042   K 4.0 04/11/2021 1042   CL 98 04/11/2021 1042   CO2 21 04/11/2021 1042   GLUCOSE 173 (H) 04/11/2021 1042   GLUCOSE 163 (H) 09/10/2014 0620   BUN 20 04/11/2021 1042   CREATININE 0.97 04/11/2021 1042   CREATININE 1.06 08/11/2014 0701   CALCIUM 10.1 04/11/2021 1042   PROT 7.8 04/11/2021 1042   ALBUMIN 5.2 (H) 04/11/2021 1042   AST 26 04/11/2021 1042   ALT 35 (H) 04/11/2021 1042   ALKPHOS 82 04/11/2021 1042   BILITOT 0.4 04/11/2021 1042   GFRNONAA 56 (L) 10/11/2020 1146   GFRAA 65 10/11/2020 1146     Diabetic  Labs (most recent): Lab Results  Component Value Date   HGBA1C 7.6 (H) 04/11/2021   HGBA1C 7.3 (H) 10/11/2020   HGBA1C 8.9 (H) 07/12/2020     Lipid Panel ( most recent) Lipid Panel     Component Value Date/Time   CHOL 163 04/11/2021 1042   TRIG 253 (H) 04/11/2021 1042   HDL 42 04/11/2021 1042   CHOLHDL 3.9 04/11/2021 1042   CHOLHDL 3.7 06/03/2014 0709   VLDL 40 06/03/2014 0709   LDLCALC 79 04/11/2021 1042   LABVLDL 42 (H) 04/11/2021 1042      Lab Results  Component Value Date   TSH 0.653 04/11/2021   TSH 1.490 04/19/2020   TSH 2.470 07/31/2019   TSH 0.528 04/22/2018   TSH 0.256 (L) 12/26/2017   TSH 1.030 07/08/2015   TSH 3.790 11/11/2014   TSH 0.177 (L) 06/03/2014   TSH 1.697 07/09/2013           Assessment & Plan:   1) Type 2 diabetes mellitus with diabetic nephropathy, without long-term current use of insulin ([place])  She presents today with her meter and logs showing slightly above target fasting and at goal postprandial glycemic profile.  She was not due for another A1c today.  She reports she still struggles to eat a meal at supper.  Her biggest meal is lunch.  She denies any hypoglycemia.  - Kimberly [name] has currently uncontrolled symptomatic type 2 DM since 70 years of age, with most recent A1c of 7.6 %.   -Recent labs reviewed.  - I had a  long discussion with her about the progressive nature of diabetes and the pathology behind its complications. -her diabetes is complicated by mild CKD, peripheral neuropathy and she remains at a high risk for more acute and chronic complications which include CAD, CVA, CKD, retinopathy, and neuropathy. These are all discussed in detail with her.  - Nutritional counseling repeated at each appointment due to patients tendency to fall back in to old habits.  - The patient admits there is a room for improvement in their diet and drink choices. -  Suggestion is made for the patient to avoid simple carbohydrates from their diet including Cakes, Sweet Desserts / Pastries, Ice Cream, Soda (diet and regular), Sweet Tea, Candies, Chips, Cookies, Sweet Pastries, Store Bought Juices, Alcohol in Excess of 1-2 drinks a day, Artificial Sweeteners, Coffee Creamer, and "Sugar-free" Products. This will help patient to have stable blood glucose profile and potentially avoid unintended weight gain.   - I encouraged the patient to switch to unprocessed or minimally processed complex starch and increased protein intake (animal or plant source), fruits, and vegetables.   - Patient is advised to stick to a routine mealtimes to eat 3 meals a day and avoid unnecessary snacks (to snack only to correct hypoglycemia).  - she declines referral to Jearld Fenton, RDN, CDE for diabetes education at this time.  - I have approached her with the following individualized plan to manage her diabetes and patient agrees:   She is tolerating the ER form of Metformin better, is advised to continue 1000 mg ER daily with breakfast and continue Glipizide 5 mg XL daily with breakfast.  She is advised to consume more protein and carbs at supper to prevent glucose from dropping too low during sleep forcing her liver to compensate by dumping glucose into the blood stream.  -she is encouraged to continue monitoring blood glucose twice daily,  before breakfast and before bed, and to call  the clinic if she has readings less than 70 or above 200 for 3 tests in a row.   - she will be considered for incretin therapy as appropriate next visit.  - Specific targets for  A1c; LDL, HDL, and Triglycerides were discussed with the patient.  2) Blood Pressure /Hypertension:  her blood pressure is not controlled to target.   she is advised to continue her current medications including HCTZ 25 mg po daily, Losartan 100 mg p.o. daily with breakfast, and Metoprolol 100 mg po daily.  3) Lipids/Hyperlipidemia:    Review of her recent lipid panel from 04/11/21 showed controlled LDL at 79 and elevated triglycerides of 253.  She prefers to stay away from statins at this time.  She is advised to avoid fried foods and butter.   4)  Weight/Diet:  her Body mass index is 36.24 kg/m.  -  clearly complicating her diabetes care.   she is a candidate for weight loss. I discussed with her the fact that loss of 5 - 10% of her  current body weight will have the most impact on her diabetes management.  Exercise, and detailed carbohydrates information provided  -  detailed on discharge instructions.  5) Hypothyroidism-unspecified Her most recent TFTs are consistent with appropriate hormone replacement.  She is advised to continue Levothyroxine 125 mcg po daily before breakfast.   - The correct intake of thyroid hormone (Levothyroxine, Synthroid), is on empty stomach first thing in the morning, with water, separated by at least 30 minutes from breakfast and other medications,  and separated by more than 4 hours from calcium, iron, multivitamins, acid reflux medications (PPIs).  - This medication is a life-long medication and will be needed to correct thyroid hormone imbalances for the rest of your life.  The dose may change from time to time, based on thyroid blood work.  - It is extremely important to be consistent taking this medication, near the same time each  morning.  -AVOID TAKING PRODUCTS CONTAINING BIOTIN (commonly found in Hair, Skin, Nails vitamins) AS IT INTERFERES WITH THE VALIDITY OF THYROID FUNCTION BLOOD TESTS.  6) Chronic Care/Health Maintenance: -she is on ACEI/ARB medications and is encouraged to initiate and continue to follow up with Ophthalmology, Dentist, Podiatrist at least yearly or according to recommendations, and advised to stay away from smoking. I have recommended yearly flu vaccine and pneumonia vaccine at least every 5 years; moderate intensity exercise for up to 150 minutes weekly; and sleep for at least 7 hours a day.  - she is advised to maintain close follow up with Erven Colla, DO for primary care needs, as well as her other providers for optimal and coordinated care.     I spent 30 minutes in the care of the patient today including review of labs from [place], Lipids, Thyroid Function, Hematology (current and previous including abstractions from other facilities); face-to-face time discussing  her blood glucose readings/logs, discussing hypoglycemia and hyperglycemia episodes and symptoms, medications doses, her options of short and long term treatment based on the latest standards of care / guidelines;  discussion about incorporating lifestyle medicine;  and documenting the encounter.    Please refer to Patient Instructions for Blood Glucose Monitoring and Insulin/Medications Dosing Guide"  in media tab for additional information. Please  also refer to " Patient Self Inventory" in the Media  tab for reviewed elements of pertinent patient history.  Kimberly [name] participated in the discussions, expressed understanding, and voiced agreement with the above  plans.  All questions were answered to her satisfaction. she is encouraged to contact clinic should she have any questions or concerns prior to her return visit.     Follow up plan: - Return in about 3 months (around 08/31/2021) for Diabetes F/U with A1c in  office, No previsit labs, Bring meter and logs.    Rayetta Pigg, [facility] [address] [place], [place] [address] Phone: [phone] Fax: [phone]  05/31/2021, 9:33 AM

## 2021-06-01 ENCOUNTER — Other Ambulatory Visit: Payer: Self-pay | Admitting: Family Medicine

## 2021-06-01 DIAGNOSIS — E1121 Type 2 diabetes mellitus with diabetic nephropathy: Secondary | ICD-10-CM

## 2021-06-02 ENCOUNTER — Ambulatory Visit (HOSPITAL_COMMUNITY)
Admission: RE | Admit: 2021-06-02 | Discharge: 2021-06-02 | Disposition: A | Discharge status: Home / Self Care | Payer: Medicare Other | Source: Ambulatory Visit | Attending: Family Medicine | Admitting: Family Medicine

## 2021-06-02 ENCOUNTER — Other Ambulatory Visit: Payer: Self-pay

## 2021-06-02 DIAGNOSIS — Z1231 Encounter for screening mammogram for malignant neoplasm of breast: Secondary | ICD-10-CM | POA: Insufficient documentation

## 2021-06-06 ENCOUNTER — Telehealth: Payer: Self-pay | Admitting: Orthopedic Surgery

## 2021-06-06 MED ORDER — CEPHALEXIN 500 MG PO CAPS: 2000.0000 mg | ORAL_CAPSULE | Freq: Once | ORAL | 4 refills | Status: AC

## 2021-06-06 NOTE — Telephone Encounter (Signed)
Sent in per protocol  Enough for her 4 visits, plus one extra   [employer]

## 2021-06-06 NOTE — Telephone Encounter (Signed)
Patient called and wants to know if she needs to take some medicine before her dentist appt  She needs it for 4 visit  .    Please call her back at [phone]

## 2021-06-08 ENCOUNTER — Other Ambulatory Visit (HOSPITAL_COMMUNITY): Payer: Self-pay | Admitting: Family Medicine

## 2021-06-08 DIAGNOSIS — R928 Other abnormal and inconclusive findings on diagnostic imaging of breast: Secondary | ICD-10-CM

## 2021-06-09 DIAGNOSIS — Z23 Encounter for immunization: Secondary | ICD-10-CM | POA: Diagnosis not present

## 2021-06-21 ENCOUNTER — Other Ambulatory Visit: Payer: Self-pay

## 2021-06-21 ENCOUNTER — Other Ambulatory Visit (HOSPITAL_COMMUNITY): Payer: Self-pay | Admitting: Family Medicine

## 2021-06-21 ENCOUNTER — Ambulatory Visit (HOSPITAL_COMMUNITY)
Admission: RE | Admit: 2021-06-21 | Discharge: 2021-06-21 | Disposition: A | Discharge status: Home / Self Care | Payer: Medicare Other | Source: Ambulatory Visit | Attending: Family Medicine | Admitting: Family Medicine

## 2021-06-21 DIAGNOSIS — R928 Other abnormal and inconclusive findings on diagnostic imaging of breast: Secondary | ICD-10-CM

## 2021-06-21 DIAGNOSIS — R922 Inconclusive mammogram: Secondary | ICD-10-CM | POA: Diagnosis not present

## 2021-06-30 ENCOUNTER — Ambulatory Visit (HOSPITAL_COMMUNITY)
Admission: RE | Admit: 2021-06-30 | Discharge: 2021-06-30 | Disposition: A | Discharge status: Home / Self Care | Payer: Medicare Other | Source: Ambulatory Visit | Attending: Family Medicine | Admitting: Family Medicine

## 2021-06-30 ENCOUNTER — Other Ambulatory Visit (HOSPITAL_COMMUNITY): Payer: Self-pay | Admitting: Family Medicine

## 2021-06-30 ENCOUNTER — Other Ambulatory Visit: Payer: Self-pay

## 2021-06-30 DIAGNOSIS — R928 Other abnormal and inconclusive findings on diagnostic imaging of breast: Secondary | ICD-10-CM

## 2021-06-30 MED ORDER — LIDOCAINE HCL (PF) 1 % IJ SOLN
INTRAMUSCULAR | Status: AC
  Filled 2021-06-30: qty 5

## 2021-06-30 MED ORDER — SODIUM CHLORIDE FLUSH 0.9 % IV SOLN
INTRAVENOUS | Status: AC
  Filled 2021-06-30: qty 20

## 2021-07-05 ENCOUNTER — Ambulatory Visit (INDEPENDENT_AMBULATORY_CARE_PROVIDER_SITE_OTHER): Payer: Medicare Other | Admitting: General Surgery

## 2021-07-05 ENCOUNTER — Encounter: Payer: Self-pay | Admitting: General Surgery

## 2021-07-05 ENCOUNTER — Other Ambulatory Visit: Payer: Self-pay

## 2021-07-05 VITALS — BP 127/61 | HR 82 | Temp 97.8°F | Resp 14 | Ht 63.0 in | Wt 198.0 lb

## 2021-07-05 DIAGNOSIS — C50411 Malignant neoplasm of upper-outer quadrant of right female breast: Secondary | ICD-10-CM

## 2021-07-06 LAB — SURGICAL PATHOLOGY

## 2021-07-07 NOTE — Progress Notes (Signed)
Kimberly [name]; [id number]; May 24, 1951   HPI Patient is a 70 year old white female who was referred to my care by Dr. Lovena Le in the breast center for newly diagnosed invasive ductal carcinoma of the right breast.  This was found on routine mammography.  Patient denies any family history of breast cancer.  She does not feel a lump.  She has not had any nipple discharge. Past Medical History:  Diagnosis Date   Anxiety    Arthritis    Depression    Diabetes mellitus    GERD (gastroesophageal reflux disease)    High blood pressure    Hypothyroid    Obesity    PONV (postoperative nausea and vomiting)     Past Surgical History:  Procedure Laterality Date   ABDOMINAL HYSTERECTOMY     Bunions Right    CARPAL TUNNEL RELEASE Right    KNEE ARTHROSCOPY Right    THYROID SURGERY     removal   TOTAL KNEE ARTHROPLASTY Right 08/25/2013   Procedure: TOTAL KNEE ARTHROPLASTY;  Surgeon: Carole Civil, MD;  Location: [facility];  Service: Orthopedics;  Laterality: Right;   TOTAL KNEE ARTHROPLASTY Left 09/09/2014   Procedure: LEFT TOTAL KNEE ARTHROPLASTY;  Surgeon: Carole Civil, MD;  Location: [facility];  Service: Orthopedics;  Laterality: Left;    Family History  Problem Relation Age of Onset   Congestive Heart Failure Father    Hypertension Father    Diabetes Mother    Hypertension Mother    Hypertension Brother    Cancer Other    Diabetes Other     Current Outpatient Medications on File Prior to Visit  Medication Sig Dispense Refill   diclofenac Sodium (VOLTAREN) 1 % GEL Apply topically 4 (four) times daily. OTC     famotidine (PEPCID) 40 MG tablet Take 1 tablet (40 mg total) by mouth daily. (Patient taking differently: Take 40 mg by mouth every morning.) 90 tablet 0   gabapentin (NEURONTIN) 100 MG capsule TAKE 1 CAPSULE 3 TIMES A   DAY 270 capsule 1   glipiZIDE (GLUCOTROL XL) 5 MG 24 hr tablet Take 1 tablet (5 mg total) by mouth daily with breakfast. 90 tablet 1    hydrochlorothiazide (HYDRODIURIL) 25 MG tablet Take 1 tablet (25 mg total) by mouth daily. 90 tablet 0   levothyroxine (SYNTHROID) 125 MCG tablet TAKE 1 TABLET ONCE DAILY   BEFORE BREAKFAST 90 tablet 0   linaclotide (LINZESS) 290 MCG CAPS capsule Take 290 mcg by mouth daily before breakfast.     losartan (COZAAR) 100 MG tablet Take 1 tablet (100 mg total) by mouth every morning. 90 tablet 1   metformin (FORTAMET) 1000 MG (OSM) 24 hr tablet Take 1 tablet (1,000 mg total) by mouth daily with breakfast. 90 tablet 1   metFORMIN (GLUCOPHAGE-XR) 500 MG 24 hr tablet Take 1,000 mg by mouth daily with breakfast.     metoprolol succinate (TOPROL-XL) 100 MG 24 hr tablet TAKE 1 TABLET AT BEDTIME   WITH OR IMMEDIATELY        FOLLOWING A MEAL 90 tablet 1   omeprazole (PRILOSEC) 20 MG capsule Take one capsule po qhs (Patient taking differently: Take 20 mg by mouth at bedtime. Take one capsule po qhs) 90 capsule 1   Probiotic Product (PROBIOTIC-10 PO) Take by mouth.     tolterodine (DETROL LA) 4 MG 24 hr capsule Take 1 capsule (4 mg total) by mouth daily. 90 capsule 3   traMADol (ULTRAM) 50 MG tablet Two tablets  po QHS prn pain 30 tablet 5   venlafaxine XR (EFFEXOR-XR) 75 MG 24 hr capsule TAKE 1 CAPSULE DAILY WITH  BREAKFAST 90 capsule 1   aspirin 81 MG tablet Take 81 mg by mouth daily. (Patient not taking: No sig reported)     Current Facility-Administered Medications on File Prior to Visit  Medication Dose Route Frequency Provider Last Rate Last Admin   bupivacaine liposome (EXPAREL) 1.3 % injection 266 mg  20 mL Infiltration Once Carole Civil, MD        Allergies  Allergen Reactions   Ace Inhibitors Cough    Social History   Substance and Sexual Activity  Alcohol Use No    Social History   Tobacco Use  Smoking Status Never  Smokeless Tobacco Never    Review of Systems  Constitutional: Negative.   HENT: Negative.    Eyes: Negative.   Respiratory:  Positive for cough.    Cardiovascular: Negative.   Gastrointestinal: Negative.   Genitourinary: Negative.   Musculoskeletal: Negative.   Skin: Negative.   Neurological: Negative.   Endo/Heme/Allergies: Negative.   Psychiatric/Behavioral: Negative.     Objective   Vitals:   07/05/21 1455  BP: 127/61  Pulse: 82  Resp: 14  Temp: 97.8 F (36.6 C)  SpO2: 96%    Physical Exam Vitals reviewed.  Constitutional:      Appearance: Normal appearance. She is not ill-appearing.  Cardiovascular:     Rate and Rhythm: Normal rate and regular rhythm.     Heart sounds: Normal heart sounds. No murmur heard.   No friction rub. No gallop.  Pulmonary:     Effort: Pulmonary effort is normal. No respiratory distress.     Breath sounds: Normal breath sounds. No stridor. No wheezing, rhonchi or rales.  Lymphadenopathy:     Cervical: No cervical adenopathy.  Skin:    General: Skin is warm and dry.  Neurological:     Mental Status: She is alert and oriented to person, place, and time.  Breast: No dominant mass, nipple discharge, or dimpling in either breast.  The right axilla has some shotty lymphadenopathy.  Bruising is present.  Left axilla is negative for palpable nodes.  US BREAST LTD UNI RIGHT INC AXILLA (Accession [id number]) (Order [id number]) Imaging  - Reflex for Order [id number] Date: 06/21/2021 Department: [facility] Released By: Rosey Bath Authorizing: Kathyrn Drown, MD   Exam Status  Status  Final [99]   PACS Intelerad Image Link   Show images for US BREAST LTD UNI RIGHT INC AXILLA    US BREAST LTD UNI RIGHT INC AXILLA (Order [id number]) - Reflex for Order [id number]   Study Result  Narrative & Impression  CLINICAL DATA:  Ultrasound only screening recall for possible mass in the right axilla.   EXAM: ULTRASOUND OF THE RIGHT BREAST   COMPARISON:  Previous exams.   FINDINGS: Targeted ultrasound of the right axilla/upper outer right breast was performed. There is a lymph  node in the right axillary tail with cortical thickening measuring 1.3 x 0.9 x 1.2 cm. This is felt to correspond well with the mass seen in the right axilla. In addition, there is a mass in the right breast at the 9 to 10 o'clock position 8 cm from nipple with margin irregularity and possible calcifications measuring 0.6 x 0.5 x 0.6 cm. This may represent an area of fibrocystic change however cannot be clearly characterized as such.   IMPRESSION: 1. Indeterminate 0.6 cm mass in  the right breast at the 9 o'clock position.   2.  Morphologically abnormal lymph node in the right axilla.   RECOMMENDATION: 1. Recommend ultrasound-guided biopsy of the mass in the right breast at the 9 o'clock position.   2. Recommend ultrasound-guided biopsy of the abnormal lymph node in the right axilla.   I have discussed the findings and recommendations with the patient. If applicable, a reminder letter will be sent to the patient regarding the next appointment.   BI-RADS CATEGORY  4: Suspicious.     Electronically Signed   By: Everlean Alstrom M.D.   On: 06/21/2021 11:56        Result History  US BREAST LTD UNI RIGHT INC AXILLA (Order #[id number]) on 06/21/2021 - Order Result History Report - Result Edited    MM 3D SCREEN BREAST BILATERAL (Order [id number])   Study Result  Narrative & Impression  CLINICAL DATA:  Screening.   EXAM: DIGITAL SCREENING BILATERAL MAMMOGRAM WITH TOMOSYNTHESIS AND CAD   TECHNIQUE: Bilateral screening digital craniocaudal and mediolateral oblique mammograms were obtained. Bilateral screening digital breast tomosynthesis was performed. The images were evaluated with computer-aided detection.   COMPARISON:  Previous exam(s).   ACR Breast Density Category b: There are scattered areas of fibroglandular density.   FINDINGS: In the right axilla, a possible mass warrants further evaluation. In the left breast, no findings suspicious for malignancy.    IMPRESSION: Further evaluation is suggested for possible mass in the right axilla.   RECOMMENDATION: Ultrasound of the right axilla. (Code:US-R-25M)   The patient will be contacted regarding the findings, and additional imaging will be scheduled.   BI-RADS CATEGORY  0: Incomplete. Need additional imaging evaluation and/or prior mammograms for comparison.     Electronically Signed   By: Kristopher Oppenheim M.D.   On: 06/07/2021 14:45      Result History  MM 3D SCREEN BREAST BILATERAL (Order #[id number]) on 06/07/2021 - Order Result History Report  MyChart Results Release  MyChart Status: Active  Results Release    Encounter-Level Documents on 06/21/2021:  Electronic signature on 06/20/2021 8:42 PM - 1 of 3 e-signatures recorded Scan on 06/08/2021 11:54 AM by Default, Provider, MD       Order-Level Documents:  There are no order-level documents.  Hospital Account-Level Documents:  There are no hospital account-level documents.  Vitals  Height Weight BMI (Calculated)  5\' 3"  (1.6 m) 198 lb (89.8 kg) 35.08   Assessment/Recommendation  Assessment Side Recommendation  Suspicious [4]     Imaging  Imaging Information    US BREAST LTD UNI RIGHT INC AXILLA: Patient Communication   Add Comments   Seen    Resulted by:  Signed Date/Time  Phone Pager  Shelly Bombard, JENNIFER 06/21/2021 11:56 AM [phone]    Link to IR Documentation Timeline  Sedation   Reference Links         Original Order  Ordered On Ordered By   06/08/2021  7:49 AM Pamelia Hoit, RT-CT         External Result Report  External Result Report   Existing Charges  Charge Line Charge Code Status Charge Trigger Charge Type  [id number] Hc US Breast Ltd Uni Inc Axilla [[id number]] [facility] Billing Imaging end exam Technical  [id number] US Breast Uni Real Time With Image Limited [address] [facility]  - Radiology 3rd Party Billing Imaging result study Professional  Final  pathology reveals invasive ductal carcinoma of the right breast with a right axillary lymph node that  is positive for metastatic disease  Assessment  Invasive ductal carcinoma the right breast with involvement of a right axillary lymph node Plan  I had extensive discussion with the patient concerning her future options.  These include a right partial mastectomy with right axillary dissection, modified radical mastectomy, modified radical mastectomy with immediate reconstruction.  Tumor markers are still pending.  The risks and benefits of all the procedures were explained to the patient.  She will call me with her ultimate decision.  She understands that I can refer her to Dr. Marla Roe of Plastic Surgery for consultation.

## 2021-07-08 ENCOUNTER — Ambulatory Visit (INDEPENDENT_AMBULATORY_CARE_PROVIDER_SITE_OTHER): Payer: Medicare Other | Admitting: Plastic Surgery

## 2021-07-08 ENCOUNTER — Encounter: Payer: Self-pay | Admitting: Plastic Surgery

## 2021-07-08 ENCOUNTER — Other Ambulatory Visit: Payer: Self-pay

## 2021-07-08 DIAGNOSIS — C50411 Malignant neoplasm of upper-outer quadrant of right female breast: Secondary | ICD-10-CM

## 2021-07-08 DIAGNOSIS — C50919 Malignant neoplasm of unspecified site of unspecified female breast: Secondary | ICD-10-CM | POA: Insufficient documentation

## 2021-07-08 NOTE — Progress Notes (Signed)
Patient ID: Kimberly [name], female    DOB: 1951/08/16, 70 y.o.   MRN: [id number]   No chief complaint on file.   The patient is a 70 year old female here for consultation for breast reconstruction.  The patient was diagnosed with right breast invasive ductal carcinoma that was found on a routine mammogram.  She also has 1 right-sided lymph node involved.  She has decided on a mastectomy as she wants to get rid of the cancer and would like to avoid radiation.  She is planning on chemotherapy.  She is 5 feet 3 inches tall and weighs 198 pounds her past medical history is positive for diabetes, arthritis, depression, thyroid disease and hypertension.  She has had several surgeries that are listed below.  Her preoperative bra size is likely a D/DD.  She would like to be around the same size but understand she will be slightly smaller.  She has been referred by Dr. Arnoldo Morale at [facility].  She has mild ptosis of her breasts as expected for age.  Review of Systems  Constitutional: Negative.  Negative for activity change and appetite change.  Eyes: Negative.   Respiratory: Negative.  Negative for chest tightness and shortness of breath.   Cardiovascular: Negative.  Negative for leg swelling.  Gastrointestinal: Negative.   Endocrine: Negative.   Genitourinary: Negative.   Musculoskeletal: Negative.   Skin: Negative.   Hematological: Negative.   Psychiatric/Behavioral: Negative.     Past Medical History:  Diagnosis Date   Anxiety    Arthritis    Depression    Diabetes mellitus    GERD (gastroesophageal reflux disease)    High blood pressure    Hypothyroid    Obesity    PONV (postoperative nausea and vomiting)     Past Surgical History:  Procedure Laterality Date   ABDOMINAL HYSTERECTOMY     Bunions Right    CARPAL TUNNEL RELEASE Right    KNEE ARTHROSCOPY Right    THYROID SURGERY     removal   TOTAL KNEE ARTHROPLASTY Right 08/25/2013   Procedure: TOTAL KNEE ARTHROPLASTY;   Surgeon: Carole Civil, MD;  Location: [facility];  Service: Orthopedics;  Laterality: Right;   TOTAL KNEE ARTHROPLASTY Left 09/09/2014   Procedure: LEFT TOTAL KNEE ARTHROPLASTY;  Surgeon: Carole Civil, MD;  Location: [facility];  Service: Orthopedics;  Laterality: Left;      Current Outpatient Medications:    aspirin 81 MG tablet, Take 81 mg by mouth daily. (Patient not taking: No sig reported), Disp: , Rfl:    diclofenac Sodium (VOLTAREN) 1 % GEL, Apply topically 4 (four) times daily. OTC, Disp: , Rfl:    famotidine (PEPCID) 40 MG tablet, Take 1 tablet (40 mg total) by mouth daily. (Patient taking differently: Take 40 mg by mouth every morning.), Disp: 90 tablet, Rfl: 0   gabapentin (NEURONTIN) 100 MG capsule, TAKE 1 CAPSULE 3 TIMES A   DAY, Disp: 270 capsule, Rfl: 1   glipiZIDE (GLUCOTROL XL) 5 MG 24 hr tablet, Take 1 tablet (5 mg total) by mouth daily with breakfast., Disp: 90 tablet, Rfl: 1   hydrochlorothiazide (HYDRODIURIL) 25 MG tablet, Take 1 tablet (25 mg total) by mouth daily., Disp: 90 tablet, Rfl: 0   levothyroxine (SYNTHROID) 125 MCG tablet, TAKE 1 TABLET ONCE DAILY   BEFORE BREAKFAST, Disp: 90 tablet, Rfl: 0   linaclotide (LINZESS) 290 MCG CAPS capsule, Take 290 mcg by mouth daily before breakfast., Disp: , Rfl:    losartan (  COZAAR) 100 MG tablet, Take 1 tablet (100 mg total) by mouth every morning., Disp: 90 tablet, Rfl: 1   metformin (FORTAMET) 1000 MG (OSM) 24 hr tablet, Take 1 tablet (1,000 mg total) by mouth daily with breakfast., Disp: 90 tablet, Rfl: 1   metFORMIN (GLUCOPHAGE-XR) 500 MG 24 hr tablet, Take 1,000 mg by mouth daily with breakfast., Disp: , Rfl:    metoprolol succinate (TOPROL-XL) 100 MG 24 hr tablet, TAKE 1 TABLET AT BEDTIME   WITH OR IMMEDIATELY        FOLLOWING A MEAL, Disp: 90 tablet, Rfl: 1   omeprazole (PRILOSEC) 20 MG capsule, Take one capsule po qhs (Patient taking differently: Take 20 mg by mouth at bedtime. Take one capsule po qhs), Disp: 90 capsule,  Rfl: 1   Probiotic Product (PROBIOTIC-10 PO), Take by mouth., Disp: , Rfl:    tolterodine (DETROL LA) 4 MG 24 hr capsule, Take 1 capsule (4 mg total) by mouth daily., Disp: 90 capsule, Rfl: 3   traMADol (ULTRAM) 50 MG tablet, Two tablets po QHS prn pain, Disp: 30 tablet, Rfl: 5   venlafaxine XR (EFFEXOR-XR) 75 MG 24 hr capsule, TAKE 1 CAPSULE DAILY WITH  BREAKFAST, Disp: 90 capsule, Rfl: 1 No current facility-administered medications for this visit.  Facility-Administered Medications Ordered in Other Visits:    bupivacaine liposome (EXPAREL) 1.3 % injection 266 mg, 20 mL, Infiltration, Once, Carole Civil, MD   Objective:   Vitals:   07/08/21 1303  BP: (!) 152/72  Pulse: 78  Resp: 16  SpO2: 93%    Physical Exam Vitals and nursing note reviewed.  Constitutional:      Appearance: Normal appearance.  HENT:     Head: Normocephalic and atraumatic.  Cardiovascular:     Rate and Rhythm: Normal rate.     Pulses: Normal pulses.  Pulmonary:     Effort: Pulmonary effort is normal. No respiratory distress.  Abdominal:     General: There is no distension.     Palpations: Abdomen is soft.     Tenderness: There is no abdominal tenderness.  Skin:    General: Skin is warm.     Capillary Refill: Capillary refill takes less than 2 seconds.     Coloration: Skin is not jaundiced.     Findings: No bruising.  Neurological:     Mental Status: She is alert and oriented to person, place, and time.  Psychiatric:        Mood and Affect: Mood normal.        Behavior: Behavior normal.        Thought Content: Thought content normal.    Assessment & Plan:  Malignant neoplasm of upper-outer quadrant of right female breast, unspecified estrogen receptor status ([place])  The options for reconstruction we explained to the patient / family for breast reconstruction.  There are two general categories of reconstruction.  We can reconstruction a breast with implants or use the patient's own tissue.   These were further discussed as listed.  Breast reconstruction is an optional procedure and eligibility depends on the full spectrum of the health of the patient and any co-morbidities.  More than one surgery is often needed to complete the reconstruction process.  The process can take three to twelve months to complete.  The breasts will not be identical due to many factors such as rib differences, shoulder asymmetry and treatments such as radiation.  The goal is to get the breasts to look normal and symmetrical in clothes.  Scars are a part of surgery and may fade some in time but will always be present under clothes.  Surgery may be an option on the non-cancer breast to achieve more symmetry.  No matter which procedure is chosen there is always the risk of complications and even failure of the body to heal.  This could result in no breast.    The options for reconstruction include:  1. Placement of a tissue expander with Acellular dermal matrix. When the expander is the desired size surgery is performed to remove the expander and place an implant.  In some cases the implant can be placed without an expander.  2. Autologous reconstruction can include using a muscle or tissue from another area of the body to create a breast.  3. Combined procedures (ie. latissismus dorsi flap) can be done with an expander / implant placed under the muscle.   The risks, benefits, scars and recovery time were discussed for each of the above. Risks include bleeding, infection, hematoma, seroma, scarring, pain, wound healing complications, flap loss, fat necrosis, capsular contracture, need for implant removal, donor site complications, bulge, hernia, umbilical necrosis, need for urgent reoperation, and need for dressing changes.   The procedure the patient selected / that was best for the patient, was then discussed in further detail.  Total time: 45 minutes. This includes time spent with the patient during the visit as  well as time spent before and after the visit reviewing the chart, documenting the encounter, making phone calls and reviewing studies.   The patient is not a great candidate for autologous reconstruction due to body habitus, age and her diabetes.  She is a candidate for reconstruction by implant technique.  My concern is with her diabetes and chemotherapy.  It is still possible and if the patient wishes it is still worth giving it a try.  The patient does want reconstruction and does want to try.  At the end of the conversation she agreed that the risks were high and did not want to do anything to slow down her cancer treatment.  I think that that is a very good idea.  So with that in mind we will plan on a delayed reconstruction with expander and implant.  At the end of the 3 months of expansion she would have exchanged to implant.  She could also have a mastopexy reduction on the opposite side.  Reconstruction could begin 1 to 2 months after she has finished her chemotherapy and her labs have normalized.  Dr. Arnoldo Morale is aware and will reach out to the patient.  Pictures were obtained of the patient and placed in the chart with the patient's or guardian's permission.  Ultimate plan will be for delayed reconstruction with expander and Flex HD placement.   Joanna, DO

## 2021-07-14 ENCOUNTER — Other Ambulatory Visit: Payer: Self-pay

## 2021-07-14 ENCOUNTER — Ambulatory Visit (INDEPENDENT_AMBULATORY_CARE_PROVIDER_SITE_OTHER): Payer: Medicare Other | Admitting: General Surgery

## 2021-07-14 ENCOUNTER — Encounter: Payer: Self-pay | Admitting: General Surgery

## 2021-07-14 VITALS — BP 136/83 | HR 64 | Temp 98.3°F | Resp 14 | Ht 63.0 in | Wt 197.0 lb

## 2021-07-14 DIAGNOSIS — Z09 Encounter for follow-up examination after completed treatment for conditions other than malignant neoplasm: Secondary | ICD-10-CM

## 2021-07-14 NOTE — H&P (Signed)
Kimberly [name]; [id number]; May 24, 1951   HPI Patient is a 70 year old white female who was referred to my care by Dr. Lovena Le in the breast center for newly diagnosed invasive ductal carcinoma of the right breast.  This was found on routine mammography.  Patient denies any family history of breast cancer.  She does not feel a lump.  She has not had any nipple discharge. Past Medical History:  Diagnosis Date   Anxiety    Arthritis    Depression    Diabetes mellitus    GERD (gastroesophageal reflux disease)    High blood pressure    Hypothyroid    Obesity    PONV (postoperative nausea and vomiting)     Past Surgical History:  Procedure Laterality Date   ABDOMINAL HYSTERECTOMY     Bunions Right    CARPAL TUNNEL RELEASE Right    KNEE ARTHROSCOPY Right    THYROID SURGERY     removal   TOTAL KNEE ARTHROPLASTY Right 08/25/2013   Procedure: TOTAL KNEE ARTHROPLASTY;  Surgeon: Carole Civil, MD;  Location: [facility];  Service: Orthopedics;  Laterality: Right;   TOTAL KNEE ARTHROPLASTY Left 09/09/2014   Procedure: LEFT TOTAL KNEE ARTHROPLASTY;  Surgeon: Carole Civil, MD;  Location: [facility];  Service: Orthopedics;  Laterality: Left;    Family History  Problem Relation Age of Onset   Congestive Heart Failure Father    Hypertension Father    Diabetes Mother    Hypertension Mother    Hypertension Brother    Cancer Other    Diabetes Other     Current Outpatient Medications on File Prior to Visit  Medication Sig Dispense Refill   diclofenac Sodium (VOLTAREN) 1 % GEL Apply topically 4 (four) times daily. OTC     famotidine (PEPCID) 40 MG tablet Take 1 tablet (40 mg total) by mouth daily. (Patient taking differently: Take 40 mg by mouth every morning.) 90 tablet 0   gabapentin (NEURONTIN) 100 MG capsule TAKE 1 CAPSULE 3 TIMES A   DAY 270 capsule 1   glipiZIDE (GLUCOTROL XL) 5 MG 24 hr tablet Take 1 tablet (5 mg total) by mouth daily with breakfast. 90 tablet 1    hydrochlorothiazide (HYDRODIURIL) 25 MG tablet Take 1 tablet (25 mg total) by mouth daily. 90 tablet 0   levothyroxine (SYNTHROID) 125 MCG tablet TAKE 1 TABLET ONCE DAILY   BEFORE BREAKFAST 90 tablet 0   linaclotide (LINZESS) 290 MCG CAPS capsule Take 290 mcg by mouth daily before breakfast.     losartan (COZAAR) 100 MG tablet Take 1 tablet (100 mg total) by mouth every morning. 90 tablet 1   metformin (FORTAMET) 1000 MG (OSM) 24 hr tablet Take 1 tablet (1,000 mg total) by mouth daily with breakfast. 90 tablet 1   metFORMIN (GLUCOPHAGE-XR) 500 MG 24 hr tablet Take 1,000 mg by mouth daily with breakfast.     metoprolol succinate (TOPROL-XL) 100 MG 24 hr tablet TAKE 1 TABLET AT BEDTIME   WITH OR IMMEDIATELY        FOLLOWING A MEAL 90 tablet 1   omeprazole (PRILOSEC) 20 MG capsule Take one capsule po qhs (Patient taking differently: Take 20 mg by mouth at bedtime. Take one capsule po qhs) 90 capsule 1   Probiotic Product (PROBIOTIC-10 PO) Take by mouth.     tolterodine (DETROL LA) 4 MG 24 hr capsule Take 1 capsule (4 mg total) by mouth daily. 90 capsule 3   traMADol (ULTRAM) 50 MG tablet Two tablets  po QHS prn pain 30 tablet 5   venlafaxine XR (EFFEXOR-XR) 75 MG 24 hr capsule TAKE 1 CAPSULE DAILY WITH  BREAKFAST 90 capsule 1   aspirin 81 MG tablet Take 81 mg by mouth daily. (Patient not taking: No sig reported)     Current Facility-Administered Medications on File Prior to Visit  Medication Dose Route Frequency Provider Last Rate Last Admin   bupivacaine liposome (EXPAREL) 1.3 % injection 266 mg  20 mL Infiltration Once Carole Civil, MD        Allergies  Allergen Reactions   Ace Inhibitors Cough    Social History   Substance and Sexual Activity  Alcohol Use No    Social History   Tobacco Use  Smoking Status Never  Smokeless Tobacco Never    Review of Systems  Constitutional: Negative.   HENT: Negative.    Eyes: Negative.   Respiratory:  Positive for cough.    Cardiovascular: Negative.   Gastrointestinal: Negative.   Genitourinary: Negative.   Musculoskeletal: Negative.   Skin: Negative.   Neurological: Negative.   Endo/Heme/Allergies: Negative.   Psychiatric/Behavioral: Negative.     Objective   Vitals:   07/05/21 1455  BP: 127/61  Pulse: 82  Resp: 14  Temp: 97.8 F (36.6 C)  SpO2: 96%    Physical Exam Vitals reviewed.  Constitutional:      Appearance: Normal appearance. She is not ill-appearing.  Cardiovascular:     Rate and Rhythm: Normal rate and regular rhythm.     Heart sounds: Normal heart sounds. No murmur heard.   No friction rub. No gallop.  Pulmonary:     Effort: Pulmonary effort is normal. No respiratory distress.     Breath sounds: Normal breath sounds. No stridor. No wheezing, rhonchi or rales.  Lymphadenopathy:     Cervical: No cervical adenopathy.  Skin:    General: Skin is warm and dry.  Neurological:     Mental Status: She is alert and oriented to person, place, and time.  Breast: No dominant mass, nipple discharge, or dimpling in either breast.  The right axilla has some shotty lymphadenopathy.  Bruising is present.  Left axilla is negative for palpable nodes.  US BREAST LTD UNI RIGHT INC AXILLA (Accession [id number]) (Order [id number]) Imaging  - Reflex for Order [id number] Date: 06/21/2021 Department: [facility] Released By: Rosey Bath Authorizing: Kathyrn Drown, MD   Exam Status  Status  Final [99]   PACS Intelerad Image Link   Show images for US BREAST LTD UNI RIGHT INC AXILLA    US BREAST LTD UNI RIGHT INC AXILLA (Order [id number]) - Reflex for Order [id number]   Study Result  Narrative & Impression  CLINICAL DATA:  Ultrasound only screening recall for possible mass in the right axilla.   EXAM: ULTRASOUND OF THE RIGHT BREAST   COMPARISON:  Previous exams.   FINDINGS: Targeted ultrasound of the right axilla/upper outer right breast was performed. There is a lymph  node in the right axillary tail with cortical thickening measuring 1.3 x 0.9 x 1.2 cm. This is felt to correspond well with the mass seen in the right axilla. In addition, there is a mass in the right breast at the 9 to 10 o'clock position 8 cm from nipple with margin irregularity and possible calcifications measuring 0.6 x 0.5 x 0.6 cm. This may represent an area of fibrocystic change however cannot be clearly characterized as such.   IMPRESSION: 1. Indeterminate 0.6 cm mass in  the right breast at the 9 o'clock position.   2.  Morphologically abnormal lymph node in the right axilla.   RECOMMENDATION: 1. Recommend ultrasound-guided biopsy of the mass in the right breast at the 9 o'clock position.   2. Recommend ultrasound-guided biopsy of the abnormal lymph node in the right axilla.   I have discussed the findings and recommendations with the patient. If applicable, a reminder letter will be sent to the patient regarding the next appointment.   BI-RADS CATEGORY  4: Suspicious.     Electronically Signed   By: Everlean Alstrom M.D.   On: 06/21/2021 11:56        Result History  US BREAST LTD UNI RIGHT INC AXILLA (Order #[id number]) on 06/21/2021 - Order Result History Report - Result Edited    MM 3D SCREEN BREAST BILATERAL (Order [id number])   Study Result  Narrative & Impression  CLINICAL DATA:  Screening.   EXAM: DIGITAL SCREENING BILATERAL MAMMOGRAM WITH TOMOSYNTHESIS AND CAD   TECHNIQUE: Bilateral screening digital craniocaudal and mediolateral oblique mammograms were obtained. Bilateral screening digital breast tomosynthesis was performed. The images were evaluated with computer-aided detection.   COMPARISON:  Previous exam(s).   ACR Breast Density Category b: There are scattered areas of fibroglandular density.   FINDINGS: In the right axilla, a possible mass warrants further evaluation. In the left breast, no findings suspicious for malignancy.    IMPRESSION: Further evaluation is suggested for possible mass in the right axilla.   RECOMMENDATION: Ultrasound of the right axilla. (Code:US-R-25M)   The patient will be contacted regarding the findings, and additional imaging will be scheduled.   BI-RADS CATEGORY  0: Incomplete. Need additional imaging evaluation and/or prior mammograms for comparison.     Electronically Signed   By: Kristopher Oppenheim M.D.   On: 06/07/2021 14:45      Result History  MM 3D SCREEN BREAST BILATERAL (Order #[id number]) on 06/07/2021 - Order Result History Report  MyChart Results Release  MyChart Status: Active  Results Release    Encounter-Level Documents on 06/21/2021:  Electronic signature on 06/20/2021 8:42 PM - 1 of 3 e-signatures recorded Scan on 06/08/2021 11:54 AM by Default, Provider, MD       Order-Level Documents:  There are no order-level documents.  Hospital Account-Level Documents:  There are no hospital account-level documents.  Vitals  Height Weight BMI (Calculated)  5\' 3"  (1.6 m) 198 lb (89.8 kg) 35.08   Assessment/Recommendation  Assessment Side Recommendation  Suspicious [4]     Imaging  Imaging Information    US BREAST LTD UNI RIGHT INC AXILLA: Patient Communication   Add Comments   Seen    Resulted by:  Signed Date/Time  Phone Pager  Shelly Bombard, JENNIFER 06/21/2021 11:56 AM [phone]    Link to IR Documentation Timeline  Sedation   Reference Links         Original Order  Ordered On Ordered By   06/08/2021  7:49 AM Pamelia Hoit, RT-CT         External Result Report  External Result Report   Existing Charges  Charge Line Charge Code Status Charge Trigger Charge Type  [id number] Hc US Breast Ltd Uni Inc Axilla [[id number]] [facility] Billing Imaging end exam Technical  [id number] US Breast Uni Real Time With Image Limited [address] [facility]  - Radiology 3rd Party Billing Imaging result study Professional  Final  pathology reveals invasive ductal carcinoma of the right breast with a right axillary lymph node that  is positive for metastatic disease  Assessment  Invasive ductal carcinoma the right breast with involvement of a right axillary lymph node Plan  I had extensive discussion with the patient concerning her future options.  These include a right partial mastectomy with right axillary dissection, modified radical mastectomy, modified radical mastectomy with immediate reconstruction.  Patient has elected to undergo right modified radical mastectomy on 07/29/2021.  The risks and benefits of the procedure including bleeding, infection, right arm pain, and the need for a drain placement were fully explained to the patient, who gave informed consent.

## 2021-07-14 NOTE — Progress Notes (Signed)
Subjective:     Kimberly [name]  Patient here for follow-up of a right breast cancer.  She was seen by Dr. Elisabeth Cara of plastic surgery for consideration of immediate reconstruction, but the patient has elected to proceed with a right modified radical mastectomy. Objective:    BP 136/83   Pulse 64   Temp 98.3 F (36.8 C) (Other (Comment))   Resp 14   Ht 5' 3"  (1.6 m)   Wt 197 lb (89.4 kg)   SpO2 97%   BMI 34.90 kg/m   General:  alert, cooperative, and no distress  Patient is ER positive, PR negative, HER2 negative     Assessment:    Invasive ductal carcinoma of the right breast with metastatic disease to an axillary lymph node    Plan:   Patient is scheduled for a right modified radical mastectomy on 07/29/2021.  The risks and benefits of the procedure including bleeding, infection, right arm pain, and drain use were fully explained to the patient, who gave informed consent.  All her questions were answered.

## 2021-07-25 ENCOUNTER — Telehealth: Payer: Self-pay | Admitting: Nurse Practitioner

## 2021-07-25 NOTE — Telephone Encounter (Signed)
Noted, thanks for the update 

## 2021-07-25 NOTE — Telephone Encounter (Signed)
Patient said she was recently DX with [facility] and recently her numbers are high  11/25- 218, 221 11/26- 188, 182 11/27- 171, 231 11/28- 191

## 2021-07-25 NOTE — Telephone Encounter (Signed)
She is having a mastectomy on Friday.

## 2021-07-25 NOTE — Telephone Encounter (Signed)
Lets not have her change anything with her meds just yet.  Have her continue monitoring it and if glucose is above 200 for 3 tests in a row call back.  Reinforce the need to eat 3 well balanced meals per day, avoiding sugary beverages and snacks.  Adding in some exercise, if she is able, will help bring down those higher numbers without adding more medications.

## 2021-07-27 ENCOUNTER — Ambulatory Visit (HOSPITAL_COMMUNITY)
Admission: RE | Admit: 2021-07-27 | Discharge: 2021-07-27 | Disposition: A | Discharge status: Home / Self Care | Payer: Medicare Other | Source: Ambulatory Visit | Attending: General Surgery | Admitting: General Surgery

## 2021-07-27 ENCOUNTER — Encounter (HOSPITAL_COMMUNITY): Payer: Self-pay

## 2021-07-27 ENCOUNTER — Encounter (HOSPITAL_COMMUNITY)
Admission: RE | Admit: 2021-07-27 | Discharge: 2021-07-27 | Disposition: A | Discharge status: Home / Self Care | Payer: Medicare Other | Source: Ambulatory Visit | Attending: General Surgery | Admitting: General Surgery

## 2021-07-27 ENCOUNTER — Other Ambulatory Visit (HOSPITAL_COMMUNITY)
Admission: RE | Admit: 2021-07-27 | Discharge: 2021-07-27 | Disposition: A | Discharge status: Home / Self Care | Payer: Medicare Other | Source: Ambulatory Visit | Attending: General Surgery | Admitting: General Surgery

## 2021-07-27 ENCOUNTER — Other Ambulatory Visit: Payer: Self-pay

## 2021-07-27 DIAGNOSIS — Z20822 Contact with and (suspected) exposure to covid-19: Secondary | ICD-10-CM | POA: Insufficient documentation

## 2021-07-27 DIAGNOSIS — Z170 Estrogen receptor positive status [ER+]: Secondary | ICD-10-CM

## 2021-07-27 DIAGNOSIS — C50911 Malignant neoplasm of unspecified site of right female breast: Secondary | ICD-10-CM

## 2021-07-27 DIAGNOSIS — E1121 Type 2 diabetes mellitus with diabetic nephropathy: Secondary | ICD-10-CM | POA: Insufficient documentation

## 2021-07-27 DIAGNOSIS — C50111 Malignant neoplasm of central portion of right female breast: Secondary | ICD-10-CM | POA: Insufficient documentation

## 2021-07-27 DIAGNOSIS — Z01818 Encounter for other preprocedural examination: Secondary | ICD-10-CM

## 2021-07-27 DIAGNOSIS — I10 Essential (primary) hypertension: Secondary | ICD-10-CM | POA: Diagnosis not present

## 2021-07-27 LAB — CBC WITH DIFFERENTIAL/PLATELET
Abs Immature Granulocytes: 0.02 10*3/uL (ref 0.00–0.07)
Basophils Absolute: 0.1 10*3/uL (ref 0.0–0.1)
Basophils Relative: 1 %
Eosinophils Absolute: 0.3 10*3/uL (ref 0.0–0.5)
Eosinophils Relative: 4 %
HCT: 34.9 % — ABNORMAL LOW (ref 36.0–46.0)
Hemoglobin: 11.7 g/dL — ABNORMAL LOW (ref 12.0–15.0)
Immature Granulocytes: 0 %
Lymphocytes Relative: 39 %
Lymphs Abs: 2.5 10*3/uL (ref 0.7–4.0)
MCH: 33.1 pg (ref 26.0–34.0)
MCHC: 33.5 g/dL (ref 30.0–36.0)
MCV: 98.6 fL (ref 80.0–100.0)
Monocytes Absolute: 0.6 10*3/uL (ref 0.1–1.0)
Monocytes Relative: 9 %
Neutro Abs: 3 10*3/uL (ref 1.7–7.7)
Neutrophils Relative %: 47 %
Platelets: 320 10*3/uL (ref 150–400)
RBC: 3.54 MIL/uL — ABNORMAL LOW (ref 3.87–5.11)
RDW: 12.1 % (ref 11.5–15.5)
WBC: 6.4 10*3/uL (ref 4.0–10.5)
nRBC: 0 % (ref 0.0–0.2)

## 2021-07-27 LAB — TYPE AND SCREEN
ABO/RH(D): O POS
Antibody Screen: NEGATIVE

## 2021-07-27 LAB — COMPREHENSIVE METABOLIC PANEL
ALT: 31 U/L (ref 0–44)
AST: 20 U/L (ref 15–41)
Albumin: 4.2 g/dL (ref 3.5–5.0)
Alkaline Phosphatase: 67 U/L (ref 38–126)
Anion gap: 10 (ref 5–15)
BUN: 21 mg/dL (ref 8–23)
CO2: 25 mmol/L (ref 22–32)
Calcium: 9.3 mg/dL (ref 8.9–10.3)
Chloride: 103 mmol/L (ref 98–111)
Creatinine, Ser: 1 mg/dL (ref 0.44–1.00)
GFR, Estimated: >60 mL/min (ref >60)
Glucose, Bld: 139 mg/dL — ABNORMAL HIGH (ref 70–99)
Potassium: 3.5 mmol/L (ref 3.5–5.1)
Sodium: 138 mmol/L (ref 135–145)
Total Bilirubin: 0.5 mg/dL (ref 0.3–1.2)
Total Protein: 7.4 g/dL (ref 6.5–8.1)

## 2021-07-27 NOTE — Patient Instructions (Signed)
Kimberly [name]  07/27/2021     @PREFPERIOPPHARMACY @   Your procedure is scheduled on  07/29/2021.   Report to [facility] at  [phone] A.M.   Call this number if you have problems the morning of surgery:  [phone]   Remember:  Do not eat or drink after midnight.     DO NOT take any medications for diabetes the morning of your procedure.      Take these medicines the morning of surgery with A SIP OF WATER        pepcid, effexor, gabapentin, levothyroxine, metoprolol, omeprazole, detrol.     Do not wear jewelry, make-up or nail polish.  Do not wear lotions, powders, or perfumes, or deodorant.  Do not shave 48 hours prior to surgery.  Men may shave face and neck.  Do not bring valuables to the hospital.  [facility] is not responsible for any belongings or valuables.  Contacts, dentures or bridgework may not be worn into surgery.  Leave your suitcase in the car.  After surgery it may be brought to your room.  For patients admitted to the hospital, discharge time will be determined by your treatment team.  Patients discharged the day of surgery will not be allowed to drive home and must have someone with them for 24 hours.    Special instructions:   DO NOT smoke tobacco or vape for 24 hours before your procedure.  Please read over the following fact sheets that you were given. Coughing and Deep Breathing, Surgical Site Infection Prevention, Anesthesia Post-op Instructions, and Care and Recovery After Surgery      Total or Modified Radical Mastectomy, Care After This sheet gives you information about how to care for yourself after your procedure. Your health care provider may also give you more specific instructions. If you have problems or questions, contact your health care provider. What can I expect after the procedure? After the procedure, it is common to have: Pain. Numbness. Stiffness in the arm or shoulder. Feelings of stress, sadness, or  depression. If the lymph nodes under your arm were removed, you may have arm swelling, weakness, or numbness on the same side of your body as your surgery. Follow these instructions at home: Incision care  Follow instructions from your health care provider about how to take care of your incision. Make sure you: Wash your hands with soap and water before you change your bandage (dressing). If soap and water are not available, use hand sanitizer. Change your dressing as told by your health care provider. Leave stitches (sutures), skin glue, or adhesive strips in place. These skin closures may need to stay in place for 2 weeks or longer. If adhesive strip edges start to loosen and curl up, you may trim the loose edges. Do not remove adhesive strips completely unless your health care provider tells you to do that. Check your incision area every day for signs of infection. Check for: Redness, swelling, or more pain. Fluid or blood. Warmth. Pus or a bad smell. If you were sent home with a surgical drain in place, follow instructions from your health care provider about emptying it. Bathing Do not take baths, swim, or use a hot tub until your health care provider approves. Ask your health care provider if you may take showers. You may only be allowed to take sponge baths. Activity  Return to your normal activities as told by your health care provider. Ask  your health care provider what activities are safe for you. Avoid activities that take a lot of effort. Be careful to avoid any activities that could cause an injury to your arm on the side of your surgery. Do not lift anything that is heavier than 10 lb (4.5 kg), or the limit that you are told, until your health care provider says that it is safe. Avoid lifting with the arm on the side of your surgery. Do not carry heavy objects on your shoulder. After your drain is removed, do exercises to prevent stiffness and swelling in your arm. Talk with  your health care provider about which exercises are safe for you. General instructions Take over-the-counter and prescription medicines only as told by your health care provider. You may eat what you usually do. Keep your arm raised (elevated) above the level of your heart when you are sitting or lying down. Do not wear tight jewelry on your arm, wrist, or fingers on the side of your surgery. You may be given a tight sleeve (compression bandage) to wear over your arm on the side of your surgery. Wear this sleeve as told by your health care provider. Ask your health care provider when you can start wearing a bra or using a breast prosthesis. Before you are involved in certain procedures such as giving blood or having your blood pressure checked, tell all your health care providers if lymph nodes under your arm were removed. This is important information. Follow-up Keep all follow-up visits as told by your health care provider. This is important. Get checked for extra fluid around your lymph nodes (lymphedema) as often as told by your health care provider. Contact a health care provider if: You have a fever. Your pain medicine is not working. Your arm swelling, weakness, or numbness has not improved after a few weeks. You have new swelling in your breast area or arm. You have redness, swelling, or more pain in your incision area. You have fluid or blood coming from your incision. Your incision feels warm to the touch. You have pus or a bad smell coming from your incision. Get help right away if: You have very bad pain in your breast area or arm. You have chest pain. You have difficulty breathing. Summary Follow instructions from your health care provider about how to take care of your incision. Check your incision area every day for signs of infection. Ask your health care provider what activities are safe for you. Keep all follow-up visits as told by your health care provider. This is  important. Make sure you know which symptoms should cause you to contact your health care provider or to get help right away. This information is not intended to replace advice given to you by your health care provider. Make sure you discuss any questions you have with your health care provider. Document Revised: 02/24/2020 Document Reviewed: 03/05/2020 Elsevier Patient Education  [place] Anesthesia, Adult, Care After This sheet gives you information about how to care for yourself after your procedure. Your health care provider may also give you more specific instructions. If you have problems or questions, contact your health care provider. What can I expect after the procedure? After the procedure, the following side effects are common: Pain or discomfort at the IV site. Nausea. Vomiting. Sore throat. Trouble concentrating. Feeling cold or chills. Feeling weak or tired. Sleepiness and fatigue. Soreness and body aches. These side effects can affect parts of the body that were  not involved in surgery. Follow these instructions at home: For the time period you were told by your health care provider:  Rest. Do not participate in activities where you could fall or become injured. Do not drive or use machinery. Do not drink alcohol. Do not take sleeping pills or medicines that cause drowsiness. Do not make important decisions or sign legal documents. Do not take care of children on your own. Eating and drinking Follow any instructions from your health care provider about eating or drinking restrictions. When you feel hungry, start by eating small amounts of foods that are soft and easy to digest (bland), such as toast. Gradually return to your regular diet. Drink enough fluid to keep your urine pale yellow. If you vomit, rehydrate by drinking water, juice, or clear broth. General instructions If you have sleep apnea, surgery and certain medicines can increase your  risk for breathing problems. Follow instructions from your health care provider about wearing your sleep device: Anytime you are sleeping, including during daytime naps. While taking prescription pain medicines, sleeping medicines, or medicines that make you drowsy. Have a responsible adult stay with you for the time you are told. It is important to have someone help care for you until you are awake and alert. Return to your normal activities as told by your health care provider. Ask your health care provider what activities are safe for you. Take over-the-counter and prescription medicines only as told by your health care provider. If you smoke, do not smoke without supervision. Keep all follow-up visits as told by your health care provider. This is important. Contact a health care provider if: You have nausea or vomiting that does not get better with medicine. You cannot eat or drink without vomiting. You have pain that does not get better with medicine. You are unable to pass urine. You develop a skin rash. You have a fever. You have redness around your IV site that gets worse. Get help right away if: You have difficulty breathing. You have chest pain. You have blood in your urine or stool, or you vomit blood. Summary After the procedure, it is common to have a sore throat or nausea. It is also common to feel tired. Have a responsible adult stay with you for the time you are told. It is important to have someone help care for you until you are awake and alert. When you feel hungry, start by eating small amounts of foods that are soft and easy to digest (bland), such as toast. Gradually return to your regular diet. Drink enough fluid to keep your urine pale yellow. Return to your normal activities as told by your health care provider. Ask your health care provider what activities are safe for you. This information is not intended to replace advice given to you by your health care  provider. Make sure you discuss any questions you have with your health care provider. Document Revised: 04/29/2020 Document Reviewed: 11/27/2019 Elsevier Patient Education  2022 [place]. How to Use Chlorhexidine for Bathing Chlorhexidine gluconate (CHG) is a germ-killing (antiseptic) solution that is used to clean the skin. It can get rid of the bacteria that normally live on the skin and can keep them away for about 24 hours. To clean your skin with CHG, you may be given: A CHG solution to use in the shower or as part of a sponge bath. A prepackaged cloth that contains CHG. Cleaning your skin with CHG may help lower the risk for infection:  While you are staying in the intensive care unit of the hospital. If you have a vascular access, such as a central line, to provide short-term or long-term access to your veins. If you have a catheter to drain urine from your bladder. If you are on a ventilator. A ventilator is a machine that helps you breathe by moving air in and out of your lungs. After surgery. What are the risks? Risks of using CHG include: A skin reaction. Hearing loss, if CHG gets in your ears and you have a perforated eardrum. Eye injury, if CHG gets in your eyes and is not rinsed out. The CHG product catching fire. Make sure that you avoid smoking and flames after applying CHG to your skin. Do not use CHG: If you have a chlorhexidine allergy or have previously reacted to chlorhexidine. On babies younger than 60 months of age. How to use CHG solution Use CHG only as told by your health care provider, and follow the instructions on the label. Use the full amount of CHG as directed. Usually, this is one bottle. During a shower Follow these steps when using CHG solution during a shower (unless your health care provider gives you different instructions): Start the shower. Use your normal soap and shampoo to wash your face and hair. Turn off the shower or move out of the  shower stream. Pour the CHG onto a clean washcloth. Do not use any type of brush or rough-edged sponge. Starting at your neck, lather your body down to your toes. Make sure you follow these instructions: If you will be having surgery, pay special attention to the part of your body where you will be having surgery. Scrub this area for at least 1 minute. Do not use CHG on your head or face. If the solution gets into your ears or eyes, rinse them well with water. Avoid your genital area. Avoid any areas of skin that have broken skin, cuts, or scrapes. Scrub your back and under your arms. Make sure to wash skin folds. Let the lather sit on your skin for 1-2 minutes or as long as told by your health care provider. Thoroughly rinse your entire body in the shower. Make sure that all body creases and crevices are rinsed well. Dry off with a clean towel. Do not put any substances on your body afterward--such as powder, lotion, or perfume--unless you are told to do so by your health care provider. Only use lotions that are recommended by the manufacturer. Put on clean clothes or pajamas. If it is the night before your surgery, sleep in clean sheets.  During a sponge bath Follow these steps when using CHG solution during a sponge bath (unless your health care provider gives you different instructions): Use your normal soap and shampoo to wash your face and hair. Pour the CHG onto a clean washcloth. Starting at your neck, lather your body down to your toes. Make sure you follow these instructions: If you will be having surgery, pay special attention to the part of your body where you will be having surgery. Scrub this area for at least 1 minute. Do not use CHG on your head or face. If the solution gets into your ears or eyes, rinse them well with water. Avoid your genital area. Avoid any areas of skin that have broken skin, cuts, or scrapes. Scrub your back and under your arms. Make sure to wash skin  folds. Let the lather sit on your skin for 1-2 minutes  or as long as told by your health care provider. Using a different clean, wet washcloth, thoroughly rinse your entire body. Make sure that all body creases and crevices are rinsed well. Dry off with a clean towel. Do not put any substances on your body afterward--such as powder, lotion, or perfume--unless you are told to do so by your health care provider. Only use lotions that are recommended by the manufacturer. Put on clean clothes or pajamas. If it is the night before your surgery, sleep in clean sheets. How to use CHG prepackaged cloths Only use CHG cloths as told by your health care provider, and follow the instructions on the label. Use the CHG cloth on clean, dry skin. Do not use the CHG cloth on your head or face unless your health care provider tells you to. When washing with the CHG cloth: Avoid your genital area. Avoid any areas of skin that have broken skin, cuts, or scrapes. Before surgery Follow these steps when using a CHG cloth to clean before surgery (unless your health care provider gives you different instructions): Using the CHG cloth, vigorously scrub the part of your body where you will be having surgery. Scrub using a back-and-forth motion for 3 minutes. The area on your body should be completely wet with CHG when you are done scrubbing. Do not rinse. Discard the cloth and let the area air-dry. Do not put any substances on the area afterward, such as powder, lotion, or perfume. Put on clean clothes or pajamas. If it is the night before your surgery, sleep in clean sheets.  For general bathing Follow these steps when using CHG cloths for general bathing (unless your health care provider gives you different instructions). Use a separate CHG cloth for each area of your body. Make sure you wash between any folds of skin and between your fingers and toes. Wash your body in the following order, switching to a new cloth  after each step: The front of your neck, shoulders, and chest. Both of your arms, under your arms, and your hands. Your stomach and groin area, avoiding the genitals. Your right leg and foot. Your left leg and foot. The back of your neck, your back, and your buttocks. Do not rinse. Discard the cloth and let the area air-dry. Do not put any substances on your body afterward--such as powder, lotion, or perfume--unless you are told to do so by your health care provider. Only use lotions that are recommended by the manufacturer. Put on clean clothes or pajamas. Contact a health care provider if: Your skin gets irritated after scrubbing. You have questions about using your solution or cloth. You swallow any chlorhexidine. Call your local poison control center (1-309-209-7432 in the [place] Get help right away if: Your eyes itch badly, or they become very red or swollen. Your skin itches badly and is red or swollen. Your hearing changes. You have trouble seeing. You have swelling or tingling in your mouth or throat. You have trouble breathing. These symptoms may represent a serious problem that is an emergency. Do not wait to see if the symptoms will go away. Get medical help right away. Call your local emergency services (911 in the [place] Do not drive yourself to the hospital. Summary Chlorhexidine gluconate (CHG) is a germ-killing (antiseptic) solution that is used to clean the skin. Cleaning your skin with CHG may help to lower your risk for infection. You may be given CHG to use for bathing. It may be in a  bottle or in a prepackaged cloth to use on your skin. Carefully follow your health care provider's instructions and the instructions on the product label. Do not use CHG if you have a chlorhexidine allergy. Contact your health care provider if your skin gets irritated after scrubbing. This information is not intended to replace advice given to you by your health care provider. Make sure you  discuss any questions you have with your health care provider. Document Revised: 10/25/2020 Document Reviewed: 10/25/2020 Elsevier Patient Education  2022 [employer].

## 2021-07-28 LAB — HEMOGLOBIN A1C
Hgb A1c MFr Bld: 8.6 % — ABNORMAL HIGH (ref 4.8–5.6)
Mean Plasma Glucose: 200 mg/dL

## 2021-07-28 LAB — SARS CORONAVIRUS 2 (TAT 6-24 HRS): SARS Coronavirus 2: NEGATIVE

## 2021-07-29 ENCOUNTER — Observation Stay (HOSPITAL_COMMUNITY)
Admission: RE | Admit: 2021-07-29 | Discharge: 2021-07-30 | Disposition: A | Discharge status: Home / Self Care | Payer: Medicare Other | Attending: General Surgery | Admitting: General Surgery

## 2021-07-29 ENCOUNTER — Ambulatory Visit (HOSPITAL_COMMUNITY): Payer: Medicare Other | Admitting: Certified Registered"

## 2021-07-29 ENCOUNTER — Encounter (HOSPITAL_COMMUNITY)
Admission: RE | Disposition: A | Discharge status: Home / Self Care | Payer: Self-pay | Source: Home / Self Care | Attending: General Surgery

## 2021-07-29 ENCOUNTER — Other Ambulatory Visit: Payer: Self-pay

## 2021-07-29 ENCOUNTER — Encounter (HOSPITAL_COMMUNITY): Payer: Self-pay | Admitting: General Surgery

## 2021-07-29 DIAGNOSIS — Z96653 Presence of artificial knee joint, bilateral: Secondary | ICD-10-CM | POA: Diagnosis not present

## 2021-07-29 DIAGNOSIS — I10 Essential (primary) hypertension: Secondary | ICD-10-CM | POA: Insufficient documentation

## 2021-07-29 DIAGNOSIS — Z9011 Acquired absence of right breast and nipple: Secondary | ICD-10-CM

## 2021-07-29 DIAGNOSIS — C50911 Malignant neoplasm of unspecified site of right female breast: Principal | ICD-10-CM | POA: Insufficient documentation

## 2021-07-29 DIAGNOSIS — Z79899 Other long term (current) drug therapy: Secondary | ICD-10-CM | POA: Insufficient documentation

## 2021-07-29 DIAGNOSIS — C50111 Malignant neoplasm of central portion of right female breast: Secondary | ICD-10-CM | POA: Diagnosis not present

## 2021-07-29 DIAGNOSIS — Z7984 Long term (current) use of oral hypoglycemic drugs: Secondary | ICD-10-CM | POA: Diagnosis not present

## 2021-07-29 DIAGNOSIS — C773 Secondary and unspecified malignant neoplasm of axilla and upper limb lymph nodes: Secondary | ICD-10-CM | POA: Diagnosis not present

## 2021-07-29 DIAGNOSIS — E119 Type 2 diabetes mellitus without complications: Secondary | ICD-10-CM | POA: Insufficient documentation

## 2021-07-29 DIAGNOSIS — E890 Postprocedural hypothyroidism: Secondary | ICD-10-CM | POA: Diagnosis not present

## 2021-07-29 DIAGNOSIS — E039 Hypothyroidism, unspecified: Secondary | ICD-10-CM | POA: Insufficient documentation

## 2021-07-29 DIAGNOSIS — R921 Mammographic calcification found on diagnostic imaging of breast: Secondary | ICD-10-CM | POA: Diagnosis not present

## 2021-07-29 DIAGNOSIS — Z170 Estrogen receptor positive status [ER+]: Secondary | ICD-10-CM

## 2021-07-29 HISTORY — PX: MASTECTOMY MODIFIED RADICAL: SHX5962

## 2021-07-29 LAB — GLUCOSE, CAPILLARY
Glucose-Capillary: 181 mg/dL — ABNORMAL HIGH (ref 70–99)
Glucose-Capillary: 193 mg/dL — ABNORMAL HIGH (ref 70–99)
Glucose-Capillary: 249 mg/dL — ABNORMAL HIGH (ref 70–99)
Glucose-Capillary: 342 mg/dL — ABNORMAL HIGH (ref 70–99)
Glucose-Capillary: 281 mg/dL — ABNORMAL HIGH (ref 70–99)

## 2021-07-29 SURGERY — MASTECTOMY, MODIFIED RADICAL
Anesthesia: General | Laterality: Right

## 2021-07-29 MED ORDER — FENTANYL CITRATE (PF) 100 MCG/2ML IJ SOLN
INTRAMUSCULAR | Status: AC
  Filled 2021-07-29: qty 2

## 2021-07-29 MED ORDER — SUGAMMADEX SODIUM 200 MG/2ML IV SOLN
INTRAVENOUS | Status: DC | PRN
  Administered 2021-07-29: 200 mg via INTRAVENOUS

## 2021-07-29 MED ORDER — FENTANYL CITRATE (PF) 100 MCG/2ML IJ SOLN
INTRAMUSCULAR | Status: DC | PRN
  Administered 2021-07-29 (×2): 50 ug via INTRAVENOUS

## 2021-07-29 MED ORDER — FENTANYL CITRATE PF 50 MCG/ML IJ SOSY
PREFILLED_SYRINGE | INTRAMUSCULAR | Status: AC
  Filled 2021-07-29: qty 1

## 2021-07-29 MED ORDER — ROCURONIUM BROMIDE 100 MG/10ML IV SOLN
INTRAVENOUS | Status: DC | PRN
  Administered 2021-07-29: 60 mg via INTRAVENOUS

## 2021-07-29 MED ORDER — ENOXAPARIN SODIUM 40 MG/0.4ML IJ SOSY
40.0000 mg | PREFILLED_SYRINGE | Freq: Once | INTRAMUSCULAR | Status: AC
  Administered 2021-07-29: 40 mg via SUBCUTANEOUS
  Filled 2021-07-29: qty 0.4

## 2021-07-29 MED ORDER — SIMETHICONE 80 MG PO CHEW: 40.0000 mg | CHEWABLE_TABLET | Freq: Four times a day (QID) | ORAL | Status: DC | PRN

## 2021-07-29 MED ORDER — OXYCODONE HCL 5 MG/5ML PO SOLN: 5.0000 mg | Freq: Once | ORAL | Status: DC | PRN

## 2021-07-29 MED ORDER — MIDAZOLAM HCL 5 MG/5ML IJ SOLN
INTRAMUSCULAR | Status: DC | PRN
  Administered 2021-07-29: 2 mg via INTRAVENOUS

## 2021-07-29 MED ORDER — ONDANSETRON HCL 4 MG/2ML IJ SOLN
INTRAMUSCULAR | Status: AC
  Filled 2021-07-29: qty 2

## 2021-07-29 MED ORDER — ONDANSETRON HCL 4 MG/2ML IJ SOLN: 4.0000 mg | Freq: Four times a day (QID) | INTRAMUSCULAR | Status: DC | PRN

## 2021-07-29 MED ORDER — DEXAMETHASONE SODIUM PHOSPHATE 10 MG/ML IJ SOLN
INTRAMUSCULAR | Status: DC | PRN
  Administered 2021-07-29: 10 mg via INTRAVENOUS

## 2021-07-29 MED ORDER — OXYCODONE HCL 5 MG PO TABS: 5.0000 mg | ORAL_TABLET | Freq: Once | ORAL | Status: DC | PRN

## 2021-07-29 MED ORDER — LIDOCAINE 2% (20 MG/ML) 5 ML SYRINGE
INTRAMUSCULAR | Status: DC | PRN
  Administered 2021-07-29: 80 mg via INTRAVENOUS

## 2021-07-29 MED ORDER — POVIDONE-IODINE 10 % EX OINT
TOPICAL_OINTMENT | CUTANEOUS | Status: AC
  Filled 2021-07-29: qty 1

## 2021-07-29 MED ORDER — LOSARTAN POTASSIUM 50 MG PO TABS
100.0000 mg | ORAL_TABLET | Freq: Every morning | ORAL | Status: DC
  Administered 2021-07-30: 100 mg via ORAL
  Filled 2021-07-29: qty 2

## 2021-07-29 MED ORDER — KETOROLAC TROMETHAMINE 30 MG/ML IJ SOLN
INTRAMUSCULAR | Status: AC
  Filled 2021-07-29: qty 1

## 2021-07-29 MED ORDER — INSULIN ASPART 100 UNIT/ML IJ SOLN: 0.0000 [IU] | Freq: Three times a day (TID) | INTRAMUSCULAR | Status: DC

## 2021-07-29 MED ORDER — ONDANSETRON 4 MG PO TBDP: 4.0000 mg | ORAL_TABLET | Freq: Four times a day (QID) | ORAL | Status: DC | PRN

## 2021-07-29 MED ORDER — CHLORHEXIDINE GLUCONATE CLOTH 2 % EX PADS: 6.0000 | MEDICATED_PAD | Freq: Once | CUTANEOUS | Status: DC

## 2021-07-29 MED ORDER — BUPIVACAINE LIPOSOME 1.3 % IJ SUSP
INTRAMUSCULAR | Status: AC
  Filled 2021-07-29: qty 20

## 2021-07-29 MED ORDER — VENLAFAXINE HCL ER 75 MG PO CP24
75.0000 mg | ORAL_CAPSULE | Freq: Every day | ORAL | Status: DC
  Administered 2021-07-29: 75 mg via ORAL
  Filled 2021-07-29: qty 1

## 2021-07-29 MED ORDER — CEFAZOLIN SODIUM-DEXTROSE 2-4 GM/100ML-% IV SOLN
2.0000 g | INTRAVENOUS | Status: AC
  Administered 2021-07-29: 2 g via INTRAVENOUS
  Filled 2021-07-29: qty 100

## 2021-07-29 MED ORDER — HYDROMORPHONE HCL 1 MG/ML IJ SOLN
1.0000 mg | INTRAMUSCULAR | Status: DC | PRN
  Administered 2021-07-29: 1 mg via INTRAVENOUS
  Filled 2021-07-29: qty 1

## 2021-07-29 MED ORDER — LORAZEPAM 2 MG/ML IJ SOLN: 1.0000 mg | INTRAMUSCULAR | Status: DC | PRN

## 2021-07-29 MED ORDER — ONDANSETRON HCL 4 MG/2ML IJ SOLN: 4.0000 mg | Freq: Once | INTRAMUSCULAR | Status: DC | PRN

## 2021-07-29 MED ORDER — KETAMINE HCL 50 MG/5ML IJ SOSY
PREFILLED_SYRINGE | INTRAMUSCULAR | Status: AC
  Filled 2021-07-29: qty 5

## 2021-07-29 MED ORDER — KETAMINE HCL 10 MG/ML IJ SOLN
INTRAMUSCULAR | Status: DC | PRN
  Administered 2021-07-29 (×3): 10 mg via INTRAVENOUS

## 2021-07-29 MED ORDER — MIDAZOLAM HCL 2 MG/2ML IJ SOLN
INTRAMUSCULAR | Status: AC
  Filled 2021-07-29: qty 2

## 2021-07-29 MED ORDER — GABAPENTIN 100 MG PO CAPS
100.0000 mg | ORAL_CAPSULE | Freq: Two times a day (BID) | ORAL | Status: DC
  Administered 2021-07-29 – 2021-07-30 (×2): 100 mg via ORAL
  Filled 2021-07-29 (×2): qty 1

## 2021-07-29 MED ORDER — METOPROLOL SUCCINATE ER 50 MG PO TB24
100.0000 mg | ORAL_TABLET | Freq: Every day | ORAL | Status: DC
  Administered 2021-07-29: 100 mg via ORAL
  Filled 2021-07-29 (×2): qty 2

## 2021-07-29 MED ORDER — LACTATED RINGERS IV SOLN: INTRAVENOUS | Status: DC

## 2021-07-29 MED ORDER — BUPIVACAINE LIPOSOME 1.3 % IJ SUSP
INTRAMUSCULAR | Status: DC | PRN
  Administered 2021-07-29: 20 mL

## 2021-07-29 MED ORDER — SCOPOLAMINE 1 MG/3DAYS TD PT72
MEDICATED_PATCH | TRANSDERMAL | Status: AC
  Administered 2021-07-29: 1.5 mg via TRANSDERMAL
  Filled 2021-07-29: qty 1

## 2021-07-29 MED ORDER — PANTOPRAZOLE SODIUM 40 MG PO TBEC
40.0000 mg | DELAYED_RELEASE_TABLET | Freq: Every day | ORAL | Status: DC
  Administered 2021-07-30: 40 mg via ORAL
  Filled 2021-07-29: qty 1

## 2021-07-29 MED ORDER — LEVOTHYROXINE SODIUM 100 MCG PO TABS
125.0000 ug | ORAL_TABLET | Freq: Every day | ORAL | Status: DC
  Administered 2021-07-30: 125 ug via ORAL
  Filled 2021-07-29: qty 1

## 2021-07-29 MED ORDER — DEXAMETHASONE SODIUM PHOSPHATE 10 MG/ML IJ SOLN
INTRAMUSCULAR | Status: AC
  Filled 2021-07-29: qty 1

## 2021-07-29 MED ORDER — HYDROCODONE-ACETAMINOPHEN 5-325 MG PO TABS
1.0000 | ORAL_TABLET | ORAL | Status: DC | PRN
  Administered 2021-07-29 – 2021-07-30 (×4): 1 via ORAL
  Filled 2021-07-29 (×4): qty 1

## 2021-07-29 MED ORDER — LIDOCAINE HCL (PF) 2 % IJ SOLN
INTRAMUSCULAR | Status: AC
  Filled 2021-07-29: qty 5

## 2021-07-29 MED ORDER — TRAMADOL HCL 50 MG PO TABS: 50.0000 mg | ORAL_TABLET | Freq: Four times a day (QID) | ORAL | Status: DC | PRN

## 2021-07-29 MED ORDER — KETOROLAC TROMETHAMINE 15 MG/ML IJ SOLN: 15.0000 mg | Freq: Four times a day (QID) | INTRAMUSCULAR | Status: AC

## 2021-07-29 MED ORDER — ONDANSETRON HCL 4 MG/2ML IJ SOLN
INTRAMUSCULAR | Status: DC | PRN
  Administered 2021-07-29 (×2): 4 mg via INTRAVENOUS

## 2021-07-29 MED ORDER — KETOROLAC TROMETHAMINE 15 MG/ML IJ SOLN: 15.0000 mg | Freq: Four times a day (QID) | INTRAMUSCULAR | Status: DC | PRN

## 2021-07-29 MED ORDER — ORAL CARE MOUTH RINSE: 15.0000 mL | Freq: Once | OROMUCOSAL | Status: DC

## 2021-07-29 MED ORDER — SCOPOLAMINE 1 MG/3DAYS TD PT72: 1.0000 | MEDICATED_PATCH | Freq: Once | TRANSDERMAL | Status: DC

## 2021-07-29 MED ORDER — ENOXAPARIN SODIUM 40 MG/0.4ML IJ SOSY
40.0000 mg | PREFILLED_SYRINGE | INTRAMUSCULAR | Status: DC
  Administered 2021-07-30: 40 mg via SUBCUTANEOUS
  Filled 2021-07-29: qty 0.4

## 2021-07-29 MED ORDER — INSULIN ASPART 100 UNIT/ML IJ SOLN
0.0000 [IU] | Freq: Three times a day (TID) | INTRAMUSCULAR | Status: DC
  Administered 2021-07-29: 11 [IU] via SUBCUTANEOUS
  Administered 2021-07-30: 8 [IU] via SUBCUTANEOUS

## 2021-07-29 MED ORDER — PROPOFOL 10 MG/ML IV BOLUS
INTRAVENOUS | Status: DC | PRN
  Administered 2021-07-29: 140 mg via INTRAVENOUS

## 2021-07-29 MED ORDER — PROPOFOL 10 MG/ML IV BOLUS
INTRAVENOUS | Status: AC
  Filled 2021-07-29: qty 20

## 2021-07-29 MED ORDER — GLIPIZIDE ER 5 MG PO TB24
5.0000 mg | ORAL_TABLET | Freq: Every day | ORAL | Status: DC
  Administered 2021-07-30: 5 mg via ORAL
  Filled 2021-07-29: qty 1

## 2021-07-29 MED ORDER — POVIDONE-IODINE 10 % OINT PACKET
TOPICAL_OINTMENT | CUTANEOUS | Status: DC | PRN
  Administered 2021-07-29: 1 via TOPICAL

## 2021-07-29 MED ORDER — KETOROLAC TROMETHAMINE 30 MG/ML IJ SOLN
INTRAMUSCULAR | Status: DC | PRN
  Administered 2021-07-29: 30 mg via INTRAVENOUS

## 2021-07-29 MED ORDER — FENTANYL CITRATE PF 50 MCG/ML IJ SOSY
25.0000 ug | PREFILLED_SYRINGE | INTRAMUSCULAR | Status: DC | PRN
  Administered 2021-07-29 (×2): 50 ug via INTRAVENOUS

## 2021-07-29 MED ORDER — METFORMIN HCL ER 500 MG PO TB24
1000.0000 mg | ORAL_TABLET | Freq: Every day | ORAL | Status: DC
  Administered 2021-07-30: 1000 mg via ORAL
  Filled 2021-07-29: qty 2

## 2021-07-29 MED ORDER — CHLORHEXIDINE GLUCONATE 0.12 % MT SOLN: 15.0000 mL | Freq: Once | OROMUCOSAL | Status: DC

## 2021-07-29 MED ORDER — 0.9 % SODIUM CHLORIDE (POUR BTL) OPTIME
TOPICAL | Status: DC | PRN
  Administered 2021-07-29: 1000 mL

## 2021-07-29 MED ORDER — INSULIN ASPART 100 UNIT/ML IJ SOLN
0.0000 [IU] | Freq: Every day | INTRAMUSCULAR | Status: DC
Start: 2021-07-29 — End: 2021-07-30
  Administered 2021-07-29: 3 [IU] via SUBCUTANEOUS

## 2021-07-29 MED ORDER — FESOTERODINE FUMARATE ER 4 MG PO TB24
8.0000 mg | ORAL_TABLET | Freq: Every day | ORAL | Status: DC
  Filled 2021-07-29: qty 2

## 2021-07-29 MED ORDER — HYDROCHLOROTHIAZIDE 25 MG PO TABS
25.0000 mg | ORAL_TABLET | Freq: Every day | ORAL | Status: DC
  Administered 2021-07-30: 25 mg via ORAL
  Filled 2021-07-29: qty 1

## 2021-07-29 SURGICAL SUPPLY — 46 items
APL PRP STRL LF DISP 70% ISPRP (MISCELLANEOUS) ×1
APPLIER CLIP 11 MED OPEN (CLIP) ×2
APPLIER CLIP 9.375 SM OPEN (CLIP) ×2
APR CLP MED 11 20 MLT OPN (CLIP) ×1
APR CLP SM 9.3 20 MLT OPN (CLIP) ×1
BINDER BREAST XLRG (GAUZE/BANDAGES/DRESSINGS) ×2 IMPLANT
CHLORAPREP W/TINT 26 (MISCELLANEOUS) ×2 IMPLANT
CLIP APPLIE 11 MED OPEN (CLIP) ×1 IMPLANT
CLIP APPLIE 9.375 SM OPEN (CLIP) ×1 IMPLANT
CLOTH BEACON ORANGE TIMEOUT ST (SAFETY) ×2 IMPLANT
COVER LIGHT HANDLE STERIS (MISCELLANEOUS) ×4 IMPLANT
DRAPE HALF SHEET 40X57 (DRAPES) ×4 IMPLANT
ELECT REM PT RETURN 9FT ADLT (ELECTROSURGICAL) ×2
ELECTRODE REM PT RTRN 9FT ADLT (ELECTROSURGICAL) ×1 IMPLANT
EVACUATOR DRAINAGE 10X20 100CC (DRAIN) ×1 IMPLANT
EVACUATOR SILICONE 100CC (DRAIN) ×2
GAUZE SPONGE 4X4 12PLY STRL (GAUZE/BANDAGES/DRESSINGS) ×2 IMPLANT
GLOVE SURG POLYISO LF SZ7.5 (GLOVE) ×2 IMPLANT
GLOVE SURG UNDER POLY LF SZ7 (GLOVE) ×6 IMPLANT
GOWN STRL REUS W/TWL LRG LVL3 (GOWN DISPOSABLE) ×6 IMPLANT
INST SET MINOR GENERAL (KITS) ×2 IMPLANT
KIT TURNOVER KIT A (KITS) ×2 IMPLANT
MANIFOLD NEPTUNE II (INSTRUMENTS) ×2 IMPLANT
NEEDLE 22X1 1/2 (OR ONLY) (NEEDLE) ×2 IMPLANT
NEEDLE HYPO 18GX1.5 BLUNT FILL (NEEDLE) ×2 IMPLANT
NS IRRIG 1000ML POUR BTL (IV SOLUTION) ×2 IMPLANT
PACK MINOR (CUSTOM PROCEDURE TRAY) ×2 IMPLANT
PAD ABD 5X9 TENDERSORB (GAUZE/BANDAGES/DRESSINGS) ×4 IMPLANT
PAD ABD 8X10 STRL (GAUZE/BANDAGES/DRESSINGS) ×2 IMPLANT
PAD ARMBOARD 7.5X6 YLW CONV (MISCELLANEOUS) ×2 IMPLANT
PENCIL SMOKE EVACUATOR (MISCELLANEOUS) ×2 IMPLANT
SET BASIN LINEN APH (SET/KITS/TRAYS/PACK) ×2 IMPLANT
SPONGE DRAIN TRACH 4X4 STRL 2S (GAUZE/BANDAGES/DRESSINGS) ×2 IMPLANT
SPONGE INTESTINAL PEANUT (DISPOSABLE) ×2 IMPLANT
SPONGE T-LAP 18X18 ~~LOC~~+RFID (SPONGE) ×4 IMPLANT
STAPLER VISISTAT (STAPLE) ×4 IMPLANT
SUT ETHILON 3 0 FSL (SUTURE) ×2 IMPLANT
SUT SILK 2 0 (SUTURE) ×2
SUT SILK 2 0 SH (SUTURE) ×2 IMPLANT
SUT SILK 2-0 18XBRD TIE 12 (SUTURE) ×1 IMPLANT
SUT VIC AB 2-0 CT1 27 (SUTURE) ×12
SUT VIC AB 2-0 CT1 TAPERPNT 27 (SUTURE) ×6 IMPLANT
SUT VIC AB 3-0 SH 27 (SUTURE) ×2
SUT VIC AB 3-0 SH 27X BRD (SUTURE) ×1 IMPLANT
SYR 20ML LL LF (SYRINGE) ×4 IMPLANT
SYR BULB IRRIG 60ML STRL (SYRINGE) ×2 IMPLANT

## 2021-07-29 NOTE — Anesthesia Postprocedure Evaluation (Signed)
Anesthesia Post Note  Patient: Kimberly [name]  Procedure(s) Performed: MASTECTOMY MODIFIED RADICAL (Right)  Patient location during evaluation: Phase II Anesthesia Type: General Level of consciousness: awake Pain management: pain level controlled Vital Signs Assessment: post-procedure vital signs reviewed and stable Respiratory status: spontaneous breathing and respiratory function stable Cardiovascular status: blood pressure returned to baseline and stable Postop Assessment: no headache and no apparent nausea or vomiting Anesthetic complications: no Comments: Late entry   No notable events documented.   Last Vitals:  Vitals:   07/29/21 1115 07/29/21 1149  BP: (!) 137/59 133/64  Pulse: 71 63  Resp: 14 16  Temp:  (!) 36.3 C  SpO2: 98% 97%    Last Pain:  Vitals:   07/29/21 1331  TempSrc:   PainSc: [place]

## 2021-07-29 NOTE — Anesthesia Preprocedure Evaluation (Signed)
Anesthesia Evaluation  Patient identified by MRN, date of birth, ID band Patient awake    Reviewed: Allergy & Precautions, H&P , NPO status , Patient's Chart, lab work & pertinent test results, reviewed documented beta blocker date and time   History of Anesthesia Complications (+) PONV  Airway Mallampati: II  TM Distance: >3 FB Neck ROM: full    Dental no notable dental hx.    Pulmonary neg pulmonary ROS,    Pulmonary exam normal breath sounds clear to auscultation       Cardiovascular Exercise Tolerance: Good hypertension, negative cardio ROS   Rhythm:regular Rate:Normal     Neuro/Psych PSYCHIATRIC DISORDERS Anxiety Depression  Neuromuscular disease    GI/Hepatic GERD  Medicated,(+) Hepatitis -, Autoimmune  Endo/Other  diabetes, Type 2Hypothyroidism   Renal/GU negative Renal ROS  negative genitourinary   Musculoskeletal   Abdominal   Peds  Hematology negative hematology ROS (+)   Anesthesia Other Findings   Reproductive/Obstetrics negative OB ROS                             Anesthesia Physical Anesthesia Plan  ASA: 3  Anesthesia Plan: General and General LMA   Post-op Pain Management:    Induction:   PONV Risk Score and Plan: Ondansetron  Airway Management Planned:   Additional Equipment:   Intra-op Plan:   Post-operative Plan:   Informed Consent: I have reviewed the patients History and Physical, chart, labs and discussed the procedure including the risks, benefits and alternatives for the proposed anesthesia with the patient or authorized representative who has indicated his/her understanding and acceptance.     Dental Advisory Given  Plan Discussed with: CRNA  Anesthesia Plan Comments:         Anesthesia Quick Evaluation

## 2021-07-29 NOTE — Anesthesia Procedure Notes (Signed)
Procedure Name: Intubation Date/Time: 07/29/2021 8:45 AM Performed by: Gwyndolyn Saxon, CRNA Pre-anesthesia Checklist: Patient identified, Emergency Drugs available, Suction available and Patient being monitored Patient Re-evaluated:Patient Re-evaluated prior to induction Oxygen Delivery Method: Circle system utilized Preoxygenation: Pre-oxygenation with 100% oxygen Induction Type: IV induction Ventilation: Mask ventilation without difficulty Laryngoscope Size: Miller and 2 Grade View: Grade I Tube type: Oral Tube size: 7.0 mm Number of attempts: 1 Airway Equipment and Method: Stylet Placement Confirmation: ETT inserted through vocal cords under direct vision, positive ETCO2 and breath sounds checked- equal and bilateral Secured at: 21 cm Tube secured with: Tape Dental Injury: Teeth and Oropharynx as per pre-operative assessment

## 2021-07-29 NOTE — Op Note (Signed)
Patient:  Kimberly [name]  DOB:  1951/06/23  MRN:  [id number]   Preop Diagnosis: Right breast carcinoma  Postop Diagnosis: Same  Procedure: Right modified radical mastectomy  Surgeon: Aviva Signs, MD  Anes: General endotracheal  Indications: Patient is a 70 year old white female who was found on recent work-up to have invasive ductal carcinoma of the right breast with metastatic disease to her right axillary lymph node.  She now presents for right modified radical mastectomy.  The risks and benefits of the procedure including bleeding, infection, pain, nerve injury, and the possibility of long arm swelling were fully explained to the patient, who gave informed consent.  Procedure note: The patient was placed in the supine position.  After induction of general endotracheal anesthesia, the right breast and axilla were prepped and draped using the usual sterile technique with ChloraPrep.  Surgical site confirmation was performed.  An elliptical incision was made medial to lateral around the right nipple.  A superior flap was formed to the clavicle.  An inferior flap was formed to the chest wall.  The breast was then removed from the chest wall medial to lateral using Bovie electrocautery.  A suture was placed superiorly for orientation purposes.  The right breast was sent to pathology further examination.  A level 2 axillary dissection was then performed in the right axilla.  Multiple palpable lymph nodes were found.  Any bleeding was controlled using small clips.  The axillary contents were removed and sent to pathology for further examination.  Care was taken to avoid the thoracodorsal artery vein and nerve as well as the long thoracic nerve.  The wound was irrigated with normal saline.  A #10 flat Jackson-Pratt drain was placed under the breast flap and in the right axilla and brought out through a separate stab wound inferior to the incision line.  It was secured in place at the skin level  using a 3-0 nylon suture.  The subcutaneous layer was reapproximated using a 2-0 Vicryl interrupted suture.  The skin was closed using staples.  Exparel was instilled into the surrounding wound.  Betadine ointment and a dry sterile dressing were applied.  All tape and needle counts were correct at the end of the procedure.  The patient was extubated in the operating room and transferred to PACU in stable condition.  Complications: None  EBL: 50 cc  Specimen: Right breast, right axillary contents  Drains: Jackson-Pratt drain to the right mastectomy flap

## 2021-07-29 NOTE — Transfer of Care (Signed)
Immediate Anesthesia Transfer of Care Note  Patient: Kimberly [name]  Procedure(s) Performed: MASTECTOMY MODIFIED RADICAL (Right)  Patient Location: PACU  Anesthesia Type:General  Level of Consciousness: drowsy  Airway & Oxygen Therapy: Patient Spontanous Breathing and Patient connected to nasal cannula oxygen  Post-op Assessment: Report given to RN and Post -op Vital signs reviewed and stable  Post vital signs: Reviewed and stable  Last Vitals:  Vitals Value Taken Time  BP 150/67 07/29/21 1016  Temp    Pulse 64 07/29/21 1018  Resp 12 07/29/21 1018  SpO2 94 % 07/29/21 1018  Vitals shown include unvalidated device data.  Last Pain:  Vitals:   07/29/21 0721  TempSrc: Oral  PainSc: 0-No pain         Complications: No notable events documented.

## 2021-07-29 NOTE — Evaluation (Signed)
Physical Therapy Evaluation Patient Details Name: Kimberly [name] MRN: [id number] DOB: October 02, 1950 Today's Date: 07/29/2021  History of Present Illness  Patient is a 70 year old white female who was found on recent work-up to have invasive ductal carcinoma of the right breast with metastatic disease to her right axillary lymph node.  She now presents for right modified radical mastectomy   Clinical Impression  Patient lethargic today for physical therapy secondary to just receiving pain meds right before session. Patient agreeable to participate and is able to transition to seated EOB with min assist. She is apprehensive on completing ROM exercises but completes several simple ones following education. Patient becomes increasingly lethargic during session due to pain meds and she is assisted back to supine. Patient and husband given handout for HEP they can complete upon returning home. Patient discharged to care of nursing for ambulation daily as tolerated for length of stay.      Recommendations for follow up therapy are one component of a multi-disciplinary discharge planning process, led by the attending physician.  Recommendations may be updated based on patient status, additional functional criteria and insurance authorization.  Follow Up Recommendations No PT follow up    Assistance Recommended at Discharge Intermittent Supervision/Assistance  Functional Status Assessment Patient has had a recent decline in their functional status and demonstrates the ability to make significant improvements in function in a reasonable and predictable amount of time.  Equipment Recommendations  None recommended by PT    Recommendations for Other Services       Precautions / Restrictions Precautions Precautions: Fall Restrictions Weight Bearing Restrictions: No      Mobility  Bed Mobility Overal bed mobility: Needs Assistance Bed Mobility: Supine to Sit;Sit to Supine     Supine to sit:  Min assist;HOB elevated Sit to supine: Supervision   General bed mobility comments: patient requires min assist to pull to seated EOB, able to laterally scoot at EOB with HHA    Transfers                        Ambulation/Gait                  Stairs            Wheelchair Mobility    Modified Rankin (Stroke Patients Only)       Balance Overall balance assessment: Needs assistance   Sitting balance-Leahy Scale: Fair Sitting balance - Comments: seated EOB, patient lethargic due to pain meds                                     Pertinent Vitals/Pain Pain Assessment: No/denies pain    Home Living Family/patient expects to be discharged to:: Private residence Living Arrangements: Spouse/significant other Available Help at Discharge: Family Type of Home: Mobile home Home Access: Stairs to enter Entrance Stairs-Rails: Can reach both Entrance Stairs-Number of Steps: 11   Home Layout: One level Home Equipment: Cane - single point;Shower seat;BSC/3in1      Prior Function Prior Level of Function : Independent/Modified Independent             Mobility Comments: 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 ADLs Comments: independent     Hand Dominance        Extremity/Trunk Assessment   Upper Extremity Assessment Upper Extremity Assessment: RUE deficits/detail RUE Deficits / Details: patient s/p R  mastectomy; minimal RUE use    Lower Extremity Assessment Lower Extremity Assessment: Overall WFL for tasks assessed    Cervical / Trunk Assessment Cervical / Trunk Assessment: Normal  Communication   Communication: No difficulties  Cognition Arousal/Alertness: Awake/alert;Suspect due to medications Behavior During Therapy: [facility] for tasks assessed/performed Overall Cognitive Status: Within Functional Limits for tasks assessed                                          General Comments       Exercises General Exercises - Upper Extremity Elbow Extension: AROM;Both;10 reps;Seated Other Exercises Other Exercises: scap retractions x 20 bilateral   Assessment/Plan    PT Assessment Patient does not need any further PT services  PT Problem List Decreased strength;Decreased range of motion;Decreased activity tolerance;Decreased balance;Decreased mobility       PT Treatment Interventions DME instruction;Balance training;Gait training;Neuromuscular re-education;Stair training;Functional mobility training;Patient/family education;Therapeutic activities;Therapeutic exercise;Manual techniques    PT Goals (Current goals can be found in the Care Plan section)  Acute Rehab PT Goals Patient Stated Goal: return home PT Goal Formulation: With patient Time For Goal Achievement: 07/29/21 Potential to Achieve Goals: Good    Frequency     Barriers to discharge        Co-evaluation               AM-PAC PT "6 Clicks" Mobility  Outcome Measure Help needed turning from your back to your side while in a flat bed without using bedrails?: A Little Help needed moving from lying on your back to sitting on the side of a flat bed without using bedrails?: A Little Help needed moving to and from a bed to a chair (including a wheelchair)?: A Little Help needed standing up from a chair using your arms (e.g., wheelchair or bedside chair)?: A Little Help needed to walk in hospital room?: A Little Help needed climbing 3-5 steps with a railing? : A Little 6 Click Score: 18    End of Session Equipment Utilized During Treatment: Oxygen Activity Tolerance: Patient limited by lethargy;Treatment limited secondary to medical complications (Comment) (patient received pain meds right before session) Patient left: in bed;with call bell/phone within reach;with family/visitor present Nurse Communication: Mobility status;Precautions PT Visit Diagnosis: Other abnormalities of gait and mobility (R26.89)     Time: 8338-2505 PT Time Calculation (min) (ACUTE ONLY): 15 min   Charges:   PT Evaluation $PT Eval Low Complexity: 1 Low PT Treatments $Therapeutic Exercise: 8-22 mins      1:42 PM, 07/29/21 Mearl Latin PT, DPT Physical Therapist at [facility]

## 2021-07-29 NOTE — Interval H&P Note (Signed)
History and Physical Interval Note:  07/29/2021 8:09 AM  Kimberly [name]  has presented today for surgery, with the diagnosis of Right breast cancer.  The various methods of treatment have been discussed with the patient and family. After consideration of risks, benefits and other options for treatment, the patient has consented to  Procedure(s): MASTECTOMY MODIFIED RADICAL (Right) as a surgical intervention.  The patient's history has been reviewed, patient examined, no change in status, stable for surgery.  I have reviewed the patient's chart and labs.  Questions were answered to the patient's satisfaction.     Aviva Signs

## 2021-07-30 DIAGNOSIS — C50911 Malignant neoplasm of unspecified site of right female breast: Secondary | ICD-10-CM | POA: Diagnosis not present

## 2021-07-30 LAB — BASIC METABOLIC PANEL
Anion gap: 8 (ref 5–15)
BUN: 34 mg/dL — ABNORMAL HIGH (ref 8–23)
CO2: 26 mmol/L (ref 22–32)
Calcium: 8.9 mg/dL (ref 8.9–10.3)
Chloride: 103 mmol/L (ref 98–111)
Creatinine, Ser: 1.35 mg/dL — ABNORMAL HIGH (ref 0.44–1.00)
GFR, Estimated: 43 mL/min — ABNORMAL LOW (ref >60)
Glucose, Bld: 192 mg/dL — ABNORMAL HIGH (ref 70–99)
Potassium: 4.1 mmol/L (ref 3.5–5.1)
Sodium: 137 mmol/L (ref 135–145)

## 2021-07-30 LAB — CBC
HCT: 31.7 % — ABNORMAL LOW (ref 36.0–46.0)
Hemoglobin: 10.3 g/dL — ABNORMAL LOW (ref 12.0–15.0)
MCH: 33.1 pg (ref 26.0–34.0)
MCHC: 32.5 g/dL (ref 30.0–36.0)
MCV: 101.9 fL — ABNORMAL HIGH (ref 80.0–100.0)
Platelets: 296 10*3/uL (ref 150–400)
RBC: 3.11 MIL/uL — ABNORMAL LOW (ref 3.87–5.11)
RDW: 12.4 % (ref 11.5–15.5)
WBC: 14.6 10*3/uL — ABNORMAL HIGH (ref 4.0–10.5)
nRBC: 0 % (ref 0.0–0.2)

## 2021-07-30 LAB — GLUCOSE, CAPILLARY: Glucose-Capillary: 225 mg/dL — ABNORMAL HIGH (ref 70–99)

## 2021-07-30 MED ORDER — HYDROCODONE-ACETAMINOPHEN 5-325 MG PO TABS: 1.0000 | ORAL_TABLET | ORAL | 0 refills | Status: DC | PRN

## 2021-07-30 NOTE — Discharge Summary (Signed)
Physician Discharge Summary  Patient ID: Kimberly [name] MRN: [id number] DOB/AGE: 69/19/1952 69 y.o.  Admit date: 07/29/2021 Discharge date: 07/30/2021  Admission Diagnoses: Right breast carcinoma, status post right modified radical mastectomy  Discharge Diagnoses: Same Principal Problem:   S/P mastectomy, right Hypothyroidism, arthritis, hypertension, GERD, diabetes mellitus  Discharged Condition: good  Hospital Course: Patient is a 70 year old white female recently diagnosed with right breast carcinoma with involvement of a lymph node in the right axilla who underwent a right modified radical mastectomy on 07/29/2021.  She tolerated surgery well.  Her postoperative course has been unremarkable.  Her diet was advanced out difficulty.  The patient is being discharged home on 07/30/2021 in good and improving condition.  Final pathology is pending.  Treatments: surgery: Right modified radical mastectomy on 07/29/2021  Discharge Exam: Blood pressure (!) 127/45, pulse (!) 58, temperature 98 F (36.7 C), temperature source Oral, resp. rate 17, height 5\' 3"  (1.6 m), weight 89.8 kg, SpO2 94 %. General appearance: alert, cooperative, and no distress Resp: clear to auscultation bilaterally Breasts: Right mastectomy incision site healing well without hematoma.  JP drainage serosanguineous in nature. Cardio: regular rate and rhythm, S1, S2 normal, no murmur, click, rub or gallop  Disposition: Discharge disposition: 01-Home or Self Care       Discharge Instructions     Diet - low sodium heart healthy   Complete by: As directed    Increase activity slowly   Complete by: As directed       Allergies as of 07/30/2021       Reactions   Ace Inhibitors Cough        Medication List     TAKE these medications    diclofenac Sodium 1 % Gel Commonly known as: VOLTAREN Apply 2 g topically daily as needed (knee pain).   famotidine 40 MG tablet Commonly known as: PEPCID Take 1 tablet  (40 mg total) by mouth daily.   gabapentin 100 MG capsule Commonly known as: NEURONTIN TAKE 1 CAPSULE 3 TIMES A   DAY What changed: See the new instructions.   glipiZIDE 5 MG 24 hr tablet Commonly known as: GLUCOTROL XL Take 1 tablet (5 mg total) by mouth daily with breakfast.   hydrochlorothiazide 25 MG tablet Commonly known as: HYDRODIURIL Take 1 tablet (25 mg total) by mouth daily.   HYDROcodone-acetaminophen 5-325 MG tablet Commonly known as: Norco Take 1 tablet by mouth every 4 (four) hours as needed for moderate pain.   levothyroxine 125 MCG tablet Commonly known as: SYNTHROID TAKE 1 TABLET ONCE DAILY   BEFORE BREAKFAST   linaclotide 290 MCG Caps capsule Commonly known as: LINZESS Take 290 mcg by mouth daily as needed (constipation).   losartan 100 MG tablet Commonly known as: COZAAR Take 1 tablet (100 mg total) by mouth every morning.   metFORMIN 500 MG 24 hr tablet Commonly known as: GLUCOPHAGE-XR Take 1,000 mg by mouth daily with breakfast.   metoprolol succinate 100 MG 24 hr tablet Commonly known as: TOPROL-XL TAKE 1 TABLET AT BEDTIME   WITH OR IMMEDIATELY        FOLLOWING A MEAL   omeprazole 20 MG capsule Commonly known as: PRILOSEC Take one capsule po qhs   tolterodine 4 MG 24 hr capsule Commonly known as: Detrol LA Take 1 capsule (4 mg total) by mouth daily.   venlafaxine XR 75 MG 24 hr capsule Commonly known as: EFFEXOR-XR TAKE 1 CAPSULE DAILY WITH  BREAKFAST  Follow-up Information     Aviva Signs, MD. Schedule an appointment as soon as possible for a visit on 08/09/2021.   Specialty: General Surgery Contact information: [id number] [address] [phone]                 Signed: Aviva Signs 07/30/2021, 9:20 AM

## 2021-07-30 NOTE — Progress Notes (Signed)
Discharge instructions given pt verbalized understanding. Discharged patient via wheelchair.

## 2021-08-01 ENCOUNTER — Encounter (HOSPITAL_COMMUNITY): Payer: Self-pay | Admitting: General Surgery

## 2021-08-01 LAB — SURGICAL PATHOLOGY

## 2021-08-02 ENCOUNTER — Telehealth (INDEPENDENT_AMBULATORY_CARE_PROVIDER_SITE_OTHER): Payer: Medicare Other | Admitting: General Surgery

## 2021-08-02 DIAGNOSIS — Z09 Encounter for follow-up examination after completed treatment for conditions other than malignant neoplasm: Secondary | ICD-10-CM

## 2021-08-02 NOTE — Telephone Encounter (Signed)
Pathology results given to patient.  I will see her in 2 days for wound check.

## 2021-08-04 ENCOUNTER — Other Ambulatory Visit: Payer: Self-pay

## 2021-08-04 ENCOUNTER — Encounter: Payer: Self-pay | Admitting: General Surgery

## 2021-08-04 ENCOUNTER — Ambulatory Visit (INDEPENDENT_AMBULATORY_CARE_PROVIDER_SITE_OTHER): Payer: Medicare Other | Admitting: General Surgery

## 2021-08-04 VITALS — BP 154/76 | HR 68 | Temp 97.4°F | Resp 16 | Ht 63.0 in | Wt 199.0 lb

## 2021-08-04 DIAGNOSIS — Z09 Encounter for follow-up examination after completed treatment for conditions other than malignant neoplasm: Secondary | ICD-10-CM

## 2021-08-04 NOTE — Progress Notes (Signed)
Subjective:     Kimberly [name]  Here for postoperative visit, status post right modified radical mastectomy.  Patient states she is doing well.  She is doing her right arm exercises.  Her JP drainage has been serosanguineous in nature approximately 60 cc a day. Objective:    BP (!) 154/76   Pulse 68   Temp (!) 97.4 F (36.3 C) (Other (Comment))   Resp 16   Ht 5\' 3"  (1.6 m)   Wt 199 lb (90.3 kg)   SpO2 96%   BMI 35.25 kg/m   General:  alert, cooperative, and no distress  Right mastectomy healing well with 1 small area of bruising present.  One half of staples removed.  JP drain was left in place. Final pathology reviewed with patient.     Assessment:    Doing well postoperatively.    Plan:   Patient to return in 5 days for wound check.  We will make referral to oncology at that time.

## 2021-08-09 ENCOUNTER — Encounter: Payer: Self-pay | Admitting: General Surgery

## 2021-08-09 ENCOUNTER — Other Ambulatory Visit: Payer: Self-pay

## 2021-08-09 ENCOUNTER — Ambulatory Visit (INDEPENDENT_AMBULATORY_CARE_PROVIDER_SITE_OTHER): Payer: Medicare Other | Admitting: General Surgery

## 2021-08-09 VITALS — BP 106/70 | HR 87 | Temp 97.7°F | Resp 18 | Ht 63.0 in | Wt 194.0 lb

## 2021-08-09 DIAGNOSIS — Z09 Encounter for follow-up examination after completed treatment for conditions other than malignant neoplasm: Secondary | ICD-10-CM

## 2021-08-10 NOTE — Progress Notes (Signed)
Subjective:     Kimberly [name]  Here for postop follow up.  Doing well, no complaints. Objective:    BP 106/70    Pulse 87    Temp 97.7 F (36.5 C) (Other (Comment))    Resp 18    Ht 5\' 3"  (1.6 m)    Wt 194 lb (88 kg)    SpO2 97%    BMI 34.37 kg/m   General:  alert, cooperative, and no distress  Right mastectomy incision healing well.  Remaining staples removed.  JP drain removed. Patient aware of final pathology results.     Assessment:    Doing well postoperatively.    Plan:   Breast prosthesis and mastectomy bra prescribed.  Referred to Oncology for further evaluation and treatment.  Follow up here prn.

## 2021-08-25 ENCOUNTER — Ambulatory Visit (HOSPITAL_COMMUNITY): Payer: Medicare Other | Admitting: Hematology

## 2021-08-30 NOTE — Progress Notes (Signed)
[name] [address], [place] [address]   Patient Care Team: Coral Spikes, DO as PCP - General (Family Medicine)  CHIEF COMPLAINTS/PURPOSE OF CONSULTATION:  Newly diagnosed right breast cancer  HISTORY OF PRESENTING ILLNESS:  Kimberly [name] 71 y.o. female is here because of recent diagnosis of right breast cancer. She underwent right mastectomy on 07/29/2021 with Dr. Arnoldo Morale.   Today she reports feeling well, and she is accompanied by her daughter. She denies prior history of cancer. She has a history of DM starting 12 years ago; she denies associated neuropathy. She reports she had a bone density scan done many years ago which was normal at the time. She reports history of hypertension. She denies history of CVA and MI. She reports soreness at the mastectomy site.   She currently lives at home with her husband. 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 She denies smoking history. Her father had metastatic squamous cell skin cancer, her paternal uncle had prostate cancer, her paternal aunt had breast cancer, and her maternal grandmother had colon cancer.   In terms of breast cancer risk profile:  She menarched at early age of 14 or 17 and she had a hysterectomy at the age of 37 due to fibroids  She had 4 pregnancies, her first child was born at age 50  She never received birth control pills. She was never exposed to fertility medications or hormone replacement therapy.  She has family history of Breast/GYN/GI cancer  I reviewed her records extensively and collaborated the history with the patient.  SUMMARY OF ONCOLOGIC HISTORY: Oncology History   No history exists.    MEDICAL HISTORY:  Past Medical History:  Diagnosis Date   Anxiety    Arthritis    Depression    Diabetes mellitus    GERD (gastroesophageal reflux disease)    High blood pressure    Hypothyroid    Obesity    PONV (postoperative  nausea and vomiting)     SURGICAL HISTORY: Past Surgical History:  Procedure Laterality Date   ABDOMINAL HYSTERECTOMY     [place] Right    CARPAL TUNNEL RELEASE Right    KNEE ARTHROSCOPY Right    MASTECTOMY MODIFIED RADICAL Right 07/29/2021   Procedure: MASTECTOMY MODIFIED RADICAL;  Surgeon: Aviva Signs, MD;  Location: [facility];  Service: General;  Laterality: Right;   THYROID SURGERY     removal   TOTAL KNEE ARTHROPLASTY Right 08/25/2013   Procedure: TOTAL KNEE ARTHROPLASTY;  Surgeon: Carole Civil, MD;  Location: [facility];  Service: Orthopedics;  Laterality: Right;   TOTAL KNEE ARTHROPLASTY Left 09/09/2014   Procedure: LEFT TOTAL KNEE ARTHROPLASTY;  Surgeon: Carole Civil, MD;  Location: [facility];  Service: Orthopedics;  Laterality: Left;    SOCIAL HISTORY: Social History   Socioeconomic History   Marital status: Married    Spouse name: Not on file   Number of children: 5   Years of education: Not on file   Highest education level: Not on file  Occupational History   Occupation: CNA    Employer: [employer]  Tobacco Use   Smoking status: Never   Smokeless tobacco: Never  Vaping Use   Vaping Use: Never used  Substance and Sexual Activity   Alcohol use: No   Drug use: No   Sexual activity: Yes    Birth control/protection: Surgical  Other Topics Concern  Not on file  Social History Narrative   Not on file   Social Determinants of Health   Financial Resource Strain: Not on file  Food Insecurity: Not on file  Transportation Needs: Not on file  Physical Activity: Not on file  Stress: Not on file  Social Connections: Not on file  Intimate Partner Violence: Not on file    FAMILY HISTORY: Family History  Problem Relation Age of Onset   Congestive Heart Failure Father    Hypertension Father    Diabetes Mother    Hypertension Mother    Hypertension Brother    Cancer Other    Diabetes Other     ALLERGIES:  is allergic to ace inhibitors.  MEDICATIONS:   Current Outpatient Medications  Medication Sig Dispense Refill   famotidine (PEPCID) 40 MG tablet Take 1 tablet (40 mg total) by mouth daily. 90 tablet 0   gabapentin (NEURONTIN) 100 MG capsule TAKE 1 CAPSULE 3 TIMES A   DAY (Patient taking differently: Take 100 mg by mouth 2 (two) times daily.) 270 capsule 1   glipiZIDE (GLUCOTROL XL) 5 MG 24 hr tablet Take 1 tablet (5 mg total) by mouth daily with breakfast. 90 tablet 1   hydrochlorothiazide (HYDRODIURIL) 25 MG tablet Take 1 tablet (25 mg total) by mouth daily. 90 tablet 0   levothyroxine (SYNTHROID) 125 MCG tablet TAKE 1 TABLET ONCE DAILY   BEFORE BREAKFAST 90 tablet 0   linaclotide (LINZESS) 290 MCG CAPS capsule Take 290 mcg by mouth daily as needed (constipation).     losartan (COZAAR) 100 MG tablet Take 1 tablet (100 mg total) by mouth every morning. 90 tablet 1   metFORMIN (GLUCOPHAGE-XR) 500 MG 24 hr tablet Take 1,000 mg by mouth daily with breakfast.     metoprolol succinate (TOPROL-XL) 100 MG 24 hr tablet TAKE 1 TABLET AT BEDTIME   WITH OR IMMEDIATELY        FOLLOWING A MEAL 90 tablet 1   omeprazole (PRILOSEC) 20 MG capsule Take one capsule po qhs 90 capsule 1   tolterodine (DETROL LA) 4 MG 24 hr capsule Take 1 capsule (4 mg total) by mouth daily. 90 capsule 3   venlafaxine XR (EFFEXOR-XR) 75 MG 24 hr capsule TAKE 1 CAPSULE DAILY WITH  BREAKFAST 90 capsule 1   diclofenac Sodium (VOLTAREN) 1 % GEL Apply 2 g topically daily as needed (knee pain). (Patient not taking: Reported on 08/31/2021)     HYDROcodone-acetaminophen (NORCO) 5-325 MG tablet Take 1 tablet by mouth every 4 (four) hours as needed for moderate pain. 30 tablet 0   No current facility-administered medications for this visit.   Facility-Administered Medications Ordered in Other Visits  Medication Dose Route Frequency Provider Last Rate Last Admin   bupivacaine liposome (EXPAREL) 1.3 % injection 266 mg  20 mL Infiltration Once Carole Civil, MD        REVIEW OF  SYSTEMS:   Review of Systems  Constitutional:  Positive for fatigue. Negative for appetite change.  Cardiovascular:  Positive for chest pain (5/10 R chest).  Psychiatric/Behavioral:  The patient is nervous/anxious.   All other systems reviewed and are negative.  PHYSICAL EXAMINATION: ECOG PERFORMANCE STATUS: 1 - Symptomatic but completely ambulatory  Vitals:   08/31/21 1320  BP: 136/63  Pulse: 72  Resp: 18  Temp: (!) 96.9 F (36.1 C)  SpO2: 98%   Filed Weights   08/31/21 1320  Weight: 196 lb 6.4 oz (89.1 kg)   Physical Exam Vitals reviewed.  Constitutional:      Appearance: Normal appearance. She is obese.  Cardiovascular:     Rate and Rhythm: Normal rate and regular rhythm.     Pulses: Normal pulses.     Heart sounds: Normal heart sounds.  Pulmonary:     Effort: Pulmonary effort is normal.     Breath sounds: Normal breath sounds.  Chest:  Breasts:    Right: Absent.     Left: Normal.  Abdominal:     Palpations: Abdomen is soft. There is no hepatomegaly, splenomegaly or mass.     Tenderness: There is no abdominal tenderness.  Musculoskeletal:     Right lower leg: No edema.     Left lower leg: No edema.  Lymphadenopathy:     Upper Body:     Right upper body: No supraclavicular, axillary or pectoral adenopathy.     Left upper body: No supraclavicular, axillary or pectoral adenopathy.  Neurological:     General: No focal deficit present.     Mental Status: She is alert and oriented to person, place, and time.  Psychiatric:        Mood and Affect: Mood normal.        Behavior: Behavior normal.    Breast Exam Chaperone: Thana Ates    LABORATORY DATA:  I have reviewed the data as listed   RADIOGRAPHIC STUDIES: I have personally reviewed the radiological reports and agreed with the findings in the report. No results found.   ASSESSMENT:  Stage IIa (T1CN1) right breast upper outer quadrant IDC: - Abnormal mammogram on 06/02/2021. - Right breast 9:00 mass  biopsy on 06/30/2021, invasive ductal carcinoma, ER 70% positive, PR negative, Ki-67 2%, HER2 1+.  Right axillary lymph node biopsy was positive for metastatic carcinoma. - She met with Dr. Marla Roe for possible reconstruction but decided against it. - She underwent right breast mastectomy with lymph node biopsy on 07/29/2021. - Pathology showed 1.1 cm grade 2 IDC, associated DCIS, margins negative.  Metastatic carcinoma involving 1/9 lymph nodes.  PT1CPN1A.   Social/family history: - She lives at home with her husband.  Today she is seen with her daughter. 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  Non-smoker. - Father had squamous cell carcinoma of the lip which has metastasized.  Maternal grandmother had colon cancer.  Paternal uncle had prostate cancer and paternal aunt had breast cancer.   PLAN:  Stage IIa (T1CN1) right breast IDC, ER positive, PR/HER2 negative: - We have reviewed pathology reports in detail. - Recommend genetic testing. - We talked about the Oncotype Dx for prognostication and predicting response to chemotherapy.  She is agreeable. - We will send for Oncotype DX and return to clinic in 2 weeks to discuss results.  If she has a high risk of recurrence, she will be offered 4 cycles of TC. - If she has low recurrence score, she will be started on antiestrogen therapy. - She does not have any clinical signs or symptoms to order scans for metastatic work-up.  2.  Bone health: - We will obtain baseline bone density test.  We will also obtain vitamin D levels.     Derek Jack, MD 08/31/21 2:17 PM  [place] [phone]   I, Thana Ates, am acting as a scribe for Dr. Derek Jack.  I, Derek Jack MD, have reviewed the above documentation for accuracy and completeness, and I agree with the above.

## 2021-08-31 ENCOUNTER — Inpatient Hospital Stay (HOSPITAL_COMMUNITY): Payer: Medicare Other | Attending: Hematology | Admitting: Hematology

## 2021-08-31 ENCOUNTER — Inpatient Hospital Stay (HOSPITAL_COMMUNITY): Payer: Medicare Other

## 2021-08-31 ENCOUNTER — Encounter (HOSPITAL_COMMUNITY): Payer: Self-pay | Admitting: Hematology

## 2021-08-31 ENCOUNTER — Encounter (HOSPITAL_COMMUNITY): Payer: Self-pay | Admitting: *Deleted

## 2021-08-31 ENCOUNTER — Other Ambulatory Visit: Payer: Self-pay

## 2021-08-31 DIAGNOSIS — Z9011 Acquired absence of right breast and nipple: Secondary | ICD-10-CM | POA: Insufficient documentation

## 2021-08-31 DIAGNOSIS — E559 Vitamin D deficiency, unspecified: Secondary | ICD-10-CM

## 2021-08-31 DIAGNOSIS — Z803 Family history of malignant neoplasm of breast: Secondary | ICD-10-CM | POA: Insufficient documentation

## 2021-08-31 DIAGNOSIS — I252 Old myocardial infarction: Secondary | ICD-10-CM | POA: Insufficient documentation

## 2021-08-31 DIAGNOSIS — Z8249 Family history of ischemic heart disease and other diseases of the circulatory system: Secondary | ICD-10-CM | POA: Insufficient documentation

## 2021-08-31 DIAGNOSIS — Z840 Family history of diseases of the skin and subcutaneous tissue: Secondary | ICD-10-CM | POA: Diagnosis not present

## 2021-08-31 DIAGNOSIS — I10 Essential (primary) hypertension: Secondary | ICD-10-CM | POA: Diagnosis not present

## 2021-08-31 DIAGNOSIS — Z170 Estrogen receptor positive status [ER+]: Secondary | ICD-10-CM | POA: Diagnosis not present

## 2021-08-31 DIAGNOSIS — C50411 Malignant neoplasm of upper-outer quadrant of right female breast: Secondary | ICD-10-CM | POA: Diagnosis not present

## 2021-08-31 DIAGNOSIS — Z90710 Acquired absence of both cervix and uterus: Secondary | ICD-10-CM | POA: Insufficient documentation

## 2021-08-31 DIAGNOSIS — N959 Unspecified menopausal and perimenopausal disorder: Secondary | ICD-10-CM

## 2021-08-31 DIAGNOSIS — E119 Type 2 diabetes mellitus without complications: Secondary | ICD-10-CM | POA: Insufficient documentation

## 2021-08-31 DIAGNOSIS — Z8042 Family history of malignant neoplasm of prostate: Secondary | ICD-10-CM | POA: Diagnosis not present

## 2021-08-31 DIAGNOSIS — Z800 Family history of malignant neoplasm of digestive organs: Secondary | ICD-10-CM | POA: Diagnosis not present

## 2021-08-31 DIAGNOSIS — Z8673 Personal history of transient ischemic attack (TIA), and cerebral infarction without residual deficits: Secondary | ICD-10-CM | POA: Insufficient documentation

## 2021-08-31 DIAGNOSIS — Z808 Family history of malignant neoplasm of other organs or systems: Secondary | ICD-10-CM | POA: Diagnosis not present

## 2021-08-31 LAB — VITAMIN D 25 HYDROXY (VIT D DEFICIENCY, FRACTURES): Vit D, 25-Hydroxy: 15.64 ng/mL — ABNORMAL LOW (ref 30–100)

## 2021-08-31 NOTE — Patient Instructions (Signed)

## 2021-08-31 NOTE — Patient Instructions (Addendum)
[place] at [facility] Discharge Instructions   You were seen and examined today by Dr. Delton Coombes.  He reviewed the results of your surgical pathology results which show breast cancer that is estrogen receptor positive, meaning this cancer is fed by estrogen.  We can place you on an estrogen blocking pill for 5 years to help prevent this cancer from coming back.   We will send a special test called OncoType Dx.  This test is done on the tumor that will predict how likely it would be that you would benefit from chemotherapy.  It takes about 2 weeks to get these results back.  We will also refer you for genetic testing and you will speak with a genetics counselor who will go over the results with you.  This will be of benefit to your children.  We will get a bone scan called a DEXA scan to get a baseline of how strong your bones are.  The estrogen blocker pill can cause your bones to weaken when taken for extended periods of time.   Return as scheduled in about 2 weeks to review the results of your tests.        Thank you for choosing [place] at [facility] to provide your oncology and hematology care.  To afford each patient quality time with our provider, please arrive at least 15 minutes before your scheduled appointment time.   If you have a lab appointment with the [facility] please come in thru the Main Entrance and check in at the main information desk.  You need to re-schedule your appointment should you arrive 10 or more minutes late.  We strive to give you quality time with our providers, and arriving late affects you and other patients whose appointments are after yours.  Also, if you no show three or more times for appointments you may be dismissed from the clinic at the providers discretion.     Again, thank you for choosing [facility].  Our hope is that these requests will decrease the amount of time that you  wait before being seen by our physicians.       _____________________________________________________________  Should you have questions after your visit to [facility], please contact our office at [phone] and follow the prompts.  Our office hours are 8:00 a.m. and 4:30 p.m. Monday - Friday.  Please note that voicemails left after 4:00 p.m. may not be returned until the following business day.  We are closed weekends and major holidays.  You do have access to a nurse 24-7, just call the main number to the clinic [phone] and do not press any options, hold on the line and a nurse will answer the phone.    For prescription refill requests, have your pharmacy contact our office and allow 72 hours.    Due to Covid, you will need to wear a mask upon entering the hospital. If you do not have a mask, a mask will be given to you at the Main Entrance upon arrival. For doctor visits, patients may have 1 support person age 15 or older with them. For treatment visits, patients can not have anyone with them due to social distancing guidelines and our immunocompromised population.

## 2021-09-01 ENCOUNTER — Encounter (HOSPITAL_COMMUNITY): Payer: Self-pay

## 2021-09-01 ENCOUNTER — Ambulatory Visit (INDEPENDENT_AMBULATORY_CARE_PROVIDER_SITE_OTHER): Payer: Medicare Other | Admitting: Nurse Practitioner

## 2021-09-01 ENCOUNTER — Encounter: Payer: Self-pay | Admitting: Nurse Practitioner

## 2021-09-01 VITALS — BP 115/67 | HR 60 | Ht 63.0 in | Wt 194.0 lb

## 2021-09-01 DIAGNOSIS — E039 Hypothyroidism, unspecified: Secondary | ICD-10-CM | POA: Diagnosis not present

## 2021-09-01 DIAGNOSIS — E1121 Type 2 diabetes mellitus with diabetic nephropathy: Secondary | ICD-10-CM

## 2021-09-01 DIAGNOSIS — E782 Mixed hyperlipidemia: Secondary | ICD-10-CM | POA: Diagnosis not present

## 2021-09-01 DIAGNOSIS — I10 Essential (primary) hypertension: Secondary | ICD-10-CM

## 2021-09-01 NOTE — Progress Notes (Signed)
Endocrinology Follow Up Note       09/01/2021, 10:08 AM   Subjective:    Patient ID: Kimberly [name], female    DOB: [birth year].  Kimberly [name] is being seen in follow up after being seen in consultation for management of currently uncontrolled symptomatic diabetes requested by  Coral Spikes, DO.   Past Medical History:  Diagnosis Date   Anxiety    Arthritis    Depression    Diabetes mellitus    GERD (gastroesophageal reflux disease)    High blood pressure    Hypothyroid    Obesity    PONV (postoperative nausea and vomiting)     Past Surgical History:  Procedure Laterality Date   ABDOMINAL HYSTERECTOMY     [place] Right    CARPAL TUNNEL RELEASE Right    KNEE ARTHROSCOPY Right    MASTECTOMY MODIFIED RADICAL Right 07/29/2021   Procedure: MASTECTOMY MODIFIED RADICAL;  Surgeon: Aviva Signs, MD;  Location: [facility];  Service: General;  Laterality: Right;   THYROID SURGERY     removal   TOTAL KNEE ARTHROPLASTY Right 08/25/2013   Procedure: TOTAL KNEE ARTHROPLASTY;  Surgeon: Carole Civil, MD;  Location: [facility];  Service: Orthopedics;  Laterality: Right;   TOTAL KNEE ARTHROPLASTY Left 09/09/2014   Procedure: LEFT TOTAL KNEE ARTHROPLASTY;  Surgeon: Carole Civil, MD;  Location: [facility];  Service: Orthopedics;  Laterality: Left;    Social History   Socioeconomic History   Marital status: Married    Spouse name: Not on file   Number of children: 5   Years of education: Not on file   Highest education level: Not on file  Occupational History   Occupation: CNA    Employer:   Tobacco Use   Smoking status: Never   Smokeless tobacco: Never  Vaping Use   Vaping Use: Never used  Substance and Sexual Activity   Alcohol use: No   Drug use: No   Sexual activity: Yes    Birth control/protection: Surgical  Other Topics Concern   Not on file  Social History Narrative   Not on file    Social Determinants of Health   Financial Resource Strain: Not on file  Food Insecurity: Not on file  Transportation Needs: Not on file  Physical Activity: Not on file  Stress: Not on file  Social Connections: Not on file    Family History  Problem Relation Age of Onset   Congestive Heart Failure Father    Hypertension Father    Diabetes Mother    Hypertension Mother    Hypertension Brother    Cancer Other    Diabetes Other     Outpatient Encounter Medications as of 09/01/2021  Medication Sig   diclofenac Sodium (VOLTAREN) 1 % GEL Apply 2 g topically daily as needed (knee pain).   famotidine (PEPCID) 40 MG tablet Take 1 tablet (40 mg total) by mouth daily.   gabapentin (NEURONTIN) 100 MG capsule TAKE 1 CAPSULE 3 TIMES A   DAY (Patient taking differently: Take 100 mg by mouth 2 (two) times daily.)   glipiZIDE (GLUCOTROL XL) 5 MG 24 hr tablet Take 1 tablet (5 mg total)  by mouth daily with breakfast.   hydrochlorothiazide (HYDRODIURIL) 25 MG tablet Take 1 tablet (25 mg total) by mouth daily.   levothyroxine (SYNTHROID) 125 MCG tablet TAKE 1 TABLET ONCE DAILY   BEFORE BREAKFAST   losartan (COZAAR) 100 MG tablet Take 1 tablet (100 mg total) by mouth every morning.   metFORMIN (GLUCOPHAGE-XR) 500 MG 24 hr tablet Take 1,000 mg by mouth daily with breakfast.   metoprolol succinate (TOPROL-XL) 100 MG 24 hr tablet TAKE 1 TABLET AT BEDTIME   WITH OR IMMEDIATELY        FOLLOWING A MEAL   omeprazole (PRILOSEC) 20 MG capsule Take one capsule po qhs   tolterodine (DETROL LA) 4 MG 24 hr capsule Take 1 capsule (4 mg total) by mouth daily.   venlafaxine XR (EFFEXOR-XR) 75 MG 24 hr capsule TAKE 1 CAPSULE DAILY WITH  BREAKFAST   [DISCONTINUED] HYDROcodone-acetaminophen (NORCO) 5-325 MG tablet Take 1 tablet by mouth every 4 (four) hours as needed for moderate pain.   [DISCONTINUED] linaclotide (LINZESS) 290 MCG CAPS capsule Take 290 mcg by mouth daily as needed (constipation). (Patient not taking:  Reported on 09/01/2021)   Facility-Administered Encounter Medications as of 09/01/2021  Medication   bupivacaine liposome (EXPAREL) 1.3 % injection 266 mg    ALLERGIES: Allergies  Allergen Reactions   Ace Inhibitors Cough    VACCINATION STATUS: Immunization History  Administered Date(s) Administered   Fluad Quad(high Dose 65+) 07/12/2020   Influenza,inj,Quad PF,6+ Mos 04/30/2018   Influenza-Unspecified 05/21/2016, 05/03/2017, 06/21/2019   Moderna Sars-Covid-2 Vaccination 10/07/2019, 11/05/2019, 08/02/2020   Pneumococcal Conjugate-13 03/03/2014, 05/30/2017   Pneumococcal Polysaccharide-23 05/27/2010    Diabetes She presents for her follow-up diabetic visit. She has type 2 diabetes mellitus. Onset time: Diagnosed at approx age of 37. Her disease course has been fluctuating. There are no hypoglycemic associated symptoms. Pertinent negatives for hypoglycemia include no nervousness/anxiousness or tremors. Associated symptoms include fatigue and foot paresthesias. Pertinent negatives for diabetes include no polydipsia, no polyuria and no weight loss. There are no hypoglycemic complications. Symptoms are stable. Diabetic complications include nephropathy and peripheral neuropathy. Risk factors for coronary artery disease include diabetes mellitus, dyslipidemia, family history, obesity, post-menopausal and hypertension. Current diabetic treatment includes oral agent (dual therapy). She is compliant with treatment most of the time. Her weight is stable. She is following a generally healthy diet. Meal planning includes avoidance of concentrated sweets. She has had a previous visit with a dietitian. She participates in exercise three times a week. Her home blood glucose trend is fluctuating minimally. Her breakfast blood glucose range is generally 180-200 mg/dl. Her bedtime blood glucose range is generally 140-180 mg/dl. (She presents today with her logs, no meter, showing slightly above target fasting and  at goal postprandial glycemic profile.  Her previsit A1c (done preoperatively prior to mastectomy) was 8.6%, increasing from last visit.  She did have elevation in glucose right after surgery but is seeing improvement in recent weeks.  She also has plans to return to pool exercises now that she has been cleared by her oncologist.) An ACE inhibitor/angiotensin II receptor blocker is being taken. She sees a podiatrist (has seen one in the distant past).Eye exam is current.  Hyperlipidemia This is a chronic problem. The current episode started more than 1 year ago. The problem is uncontrolled. Recent lipid tests were reviewed and are variable. Exacerbating diseases include chronic renal disease, diabetes, hypothyroidism and obesity. Factors aggravating her hyperlipidemia include thiazides and beta blockers. Current antihyperlipidemic treatment includes diet change  and exercise. The current treatment provides mild improvement of lipids. There are no compliance problems.  Risk factors for coronary artery disease include diabetes mellitus, dyslipidemia, family history, hypertension, obesity and post-menopausal.  Hypertension This is a chronic problem. The current episode started more than 1 year ago. The problem has been gradually improving since onset. The problem is uncontrolled. Pertinent negatives include no palpitations. Agents associated with hypertension include thyroid hormones. Risk factors for coronary artery disease include diabetes mellitus, dyslipidemia, family history, obesity and post-menopausal state. Past treatments include angiotensin blockers, beta blockers and diuretics. The current treatment provides moderate improvement. There are no compliance problems.  Hypertensive end-organ damage includes kidney disease. Identifiable causes of hypertension include chronic renal disease and a thyroid problem.  Thyroid Problem Presents for initial visit. Symptoms include fatigue. Patient reports no anxiety,  cold intolerance, constipation, depressed mood, heat intolerance, leg swelling, palpitations, tremors, weight gain or weight loss. The symptoms have been stable. Past treatments include levothyroxine. The treatment provided significant relief. Her past medical history is significant for diabetes, hyperlipidemia and neuropathy (? vs chronic back issues).    Review of systems  Constitutional: + stable body weight, current Body mass index is 34.37 kg/m., + fatigue, no subjective hyperthermia, no subjective hypothermia Eyes: no blurry vision, no xerophthalmia ENT: no sore throat, no nodules palpated in throat, no dysphagia/odynophagia, no hoarseness Cardiovascular: no chest pain, no shortness of breath, no palpitations, no leg swelling Respiratory: no cough, no shortness of breath Gastrointestinal: no nausea/vomiting/diarrhea Musculoskeletal: no muscle/joint aches- recent right mastectomy Skin: no rashes, no hyperemia Neurological: no tremors, + numbness/tingling to BLE, no dizziness Psychiatric: no depression, no anxiety  Objective:     BP 115/67    Pulse 60    Ht 5\' 3"  (1.6 m)    Wt 194 lb (88 kg)    SpO2 97%    BMI 34.37 kg/m   Wt Readings from Last 3 Encounters:  09/01/21 194 lb (88 kg)  08/31/21 196 lb 6.4 oz (89.1 kg)  08/09/21 194 lb (88 kg)     BP Readings from Last 3 Encounters:  09/01/21 115/67  08/31/21 136/63  08/09/21 106/70      Physical Exam- Limited  Constitutional:  Body mass index is 34.37 kg/m. , not in acute distress, normal state of mind Eyes:  EOMI, no exophthalmos Neck: Supple Cardiovascular: RRR, no murmurs, rubs, or gallops, no edema Respiratory: Adequate breathing efforts, no crackles, rales, rhonchi, or wheezing Musculoskeletal: no gross deformities, strength intact in all four extremities, no gross restriction of joint movements Skin:  no rashes, no hyperemia Neurological: no tremor with outstretched hands   Diabetic Foot Exam - Simple   Simple  Foot Form Diabetic Foot exam was performed with the following findings: Yes 09/01/2021 10:00 AM  Visual Inspection See comments: Yes Sensation Testing Intact to touch and monofilament testing bilaterally: Yes Pulse Check Posterior Tibialis and Dorsalis pulse intact bilaterally: Yes Comments Left great toe onychomycosis       CMP ( most recent) CMP     Component Value Date/Time   NA 137 07/30/2021 0354   NA 138 04/11/2021 1042   K 4.1 07/30/2021 0354   CL 103 07/30/2021 0354   CO2 26 07/30/2021 0354   GLUCOSE 192 (H) 07/30/2021 0354   BUN 34 (H) 07/30/2021 0354   BUN 20 04/11/2021 1042   CREATININE 1.35 (H) 07/30/2021 0354   CREATININE 1.06 08/11/2014 0701   CALCIUM 8.9 07/30/2021 0354   PROT 7.4 07/27/2021 1443  PROT 7.8 04/11/2021 1042   ALBUMIN 4.2 07/27/2021 1443   ALBUMIN 5.2 (H) 04/11/2021 1042   AST 20 07/27/2021 1443   ALT 31 07/27/2021 1443   ALKPHOS 67 07/27/2021 1443   BILITOT 0.5 07/27/2021 1443   BILITOT 0.4 04/11/2021 1042   GFRNONAA 43 (L) 07/30/2021 0354   GFRAA 65 10/11/2020 1146     Diabetic Labs (most recent): Lab Results  Component Value Date   HGBA1C 8.6 (H) 07/27/2021   HGBA1C 7.6 (H) 04/11/2021   HGBA1C 7.3 (H) 10/11/2020     Lipid Panel ( most recent) Lipid Panel     Component Value Date/Time   CHOL 163 04/11/2021 1042   TRIG 253 (H) 04/11/2021 1042   HDL 42 04/11/2021 1042   CHOLHDL 3.9 04/11/2021 1042   CHOLHDL 3.7 06/03/2014 0709   VLDL 40 06/03/2014 0709   LDLCALC 79 04/11/2021 1042   LABVLDL 42 (H) 04/11/2021 1042      Lab Results  Component Value Date   TSH 0.653 04/11/2021   TSH 1.490 04/19/2020   TSH 2.470 07/31/2019   TSH 0.528 04/22/2018   TSH 0.256 (L) 12/26/2017   TSH 1.030 07/08/2015   TSH 3.790 11/11/2014   TSH 0.177 (L) 06/03/2014   TSH 1.697 07/09/2013           Assessment & Plan:   1) Type 2 diabetes mellitus with diabetic nephropathy, without long-term current use of insulin ([place])  She  presents today with her logs, no meter, showing slightly above target fasting and at goal postprandial glycemic profile.  Her previsit A1c (done preoperatively prior to mastectomy) was 8.6%, increasing from last visit.  She did have elevation in glucose right after surgery but is seeing improvement in recent weeks.  She also has plans to return to pool exercises now that she has been cleared by her oncologist.  - Kimberly [name] has currently uncontrolled symptomatic type 2 DM since 71 years of age.  -Recent labs reviewed.  - I had a long discussion with her about the progressive nature of diabetes and the pathology behind its complications. -her diabetes is complicated by mild CKD, peripheral neuropathy and she remains at a high risk for more acute and chronic complications which include CAD, CVA, CKD, retinopathy, and neuropathy. These are all discussed in detail with her.  - Nutritional counseling repeated at each appointment due to patients tendency to fall back in to old habits.  - The patient admits there is a room for improvement in their diet and drink choices. -  Suggestion is made for the patient to avoid simple carbohydrates from their diet including Cakes, Sweet Desserts / Pastries, Ice Cream, Soda (diet and regular), Sweet Tea, Candies, Chips, Cookies, Sweet Pastries, Store Bought Juices, Alcohol in Excess of 1-2 drinks a day, Artificial Sweeteners, Coffee Creamer, and "Sugar-free" Products. This will help patient to have stable blood glucose profile and potentially avoid unintended weight gain.   - I encouraged the patient to switch to unprocessed or minimally processed complex starch and increased protein intake (animal or plant source), fruits, and vegetables.   - Patient is advised to stick to a routine mealtimes to eat 3 meals a day and avoid unnecessary snacks (to snack only to correct hypoglycemia).  - she declines referral to Jearld Fenton, RDN, CDE for diabetes education at  this time.  - I have approached her with the following individualized plan to manage her diabetes and patient agrees:   She is tolerating the  ER form of Metformin better, is advised to continue 1000 mg ER daily with breakfast and continue Glipizide 5 mg XL daily with breakfast.    -she is encouraged to continue monitoring blood glucose twice daily, before breakfast and before bed, and to call the clinic if she has readings less than 70 or above 200 for 3 tests in a row.   - she will be considered for incretin therapy as appropriate next visit.  - Specific targets for  A1c; LDL, HDL, and Triglycerides were discussed with the patient.  2) Blood Pressure /Hypertension:  her blood pressure is controlled to target.   she is advised to continue her current medications including HCTZ 25 mg po daily, Losartan 100 mg p.o. daily with breakfast, and Metoprolol 100 mg po daily.  3) Lipids/Hyperlipidemia:    Review of her recent lipid panel from 04/11/21 showed controlled LDL at 79 and elevated triglycerides of 253.  She prefers to stay away from statins at this time.  She is advised to avoid fried foods and butter.   4)  Weight/Diet:  her Body mass index is 34.37 kg/m.  -  clearly complicating her diabetes care.   she is a candidate for weight loss. I discussed with her the fact that loss of 5 - 10% of her  current body weight will have the most impact on her diabetes management.  Exercise, and detailed carbohydrates information provided  -  detailed on discharge instructions.  5) Vitamin D Deficiency Her recent vitamin D level was 15.64 on 08/31/21- checked by her oncologist.  She has upcoming appt to discuss with them.  6) Hypothyroidism-unspecified There are no recent TFTs to review.  She is advised to continue Levothyroxine 125 mcg po daily before breakfast.  Will recheck TFTs prior to next visit and adjust dose if needed.   - The correct intake of thyroid hormone (Levothyroxine, Synthroid), is on  empty stomach first thing in the morning, with water, separated by at least 30 minutes from breakfast and other medications,  and separated by more than 4 hours from calcium, iron, multivitamins, acid reflux medications (PPIs).  - This medication is a life-long medication and will be needed to correct thyroid hormone imbalances for the rest of your life.  The dose may change from time to time, based on thyroid blood work.  - It is extremely important to be consistent taking this medication, near the same time each morning.  -AVOID TAKING PRODUCTS CONTAINING BIOTIN (commonly found in Hair, Skin, Nails vitamins) AS IT INTERFERES WITH THE VALIDITY OF THYROID FUNCTION BLOOD TESTS.  6) Chronic Care/Health Maintenance: -she is on ACEI/ARB medications and is encouraged to initiate and continue to follow up with Ophthalmology, Dentist, Podiatrist at least yearly or according to recommendations, and advised to stay away from smoking. I have recommended yearly flu vaccine and pneumonia vaccine at least every 5 years; moderate intensity exercise for up to 150 minutes weekly; and sleep for at least 7 hours a day.  - she is advised to maintain close follow up with Coral Spikes, DO for primary care needs, as well as her other providers for optimal and coordinated care.     I spent 43 minutes in the care of the patient today including review of labs from [facility], Lipids, Thyroid Function, Hematology (current and previous including abstractions from other facilities); face-to-face time discussing  her blood glucose readings/logs, discussing hypoglycemia and hyperglycemia episodes and symptoms, medications doses, her options of short and long term  treatment based on the latest standards of care / guidelines;  discussion about incorporating lifestyle medicine;  and documenting the encounter.    Please refer to Patient Instructions for Blood Glucose Monitoring and Insulin/Medications Dosing Guide"  in media tab for  additional information. Please  also refer to " Patient Self Inventory" in the Media  tab for reviewed elements of pertinent patient history.  Kimberly [name] participated in the discussions, expressed understanding, and voiced agreement with the above plans.  All questions were answered to her satisfaction. she is encouraged to contact clinic should she have any questions or concerns prior to her return visit.     Follow up plan: - Return in about 3 months (around 11/30/2021) for Diabetes F/U with A1c in office, Thyroid follow up, Previsit labs, Bring meter and logs.   Rayetta Pigg, [facility] [address] [place], [place] [address] Phone: [phone] Fax: [phone]  09/01/2021, 10:08 AM

## 2021-09-01 NOTE — Progress Notes (Signed)
Oncotype DX requested on [id number] per Dr. Delton Coombes

## 2021-09-02 LAB — GENETIC SCREENING ORDER

## 2021-09-05 ENCOUNTER — Encounter: Payer: Self-pay | Admitting: Family Medicine

## 2021-09-05 ENCOUNTER — Ambulatory Visit (INDEPENDENT_AMBULATORY_CARE_PROVIDER_SITE_OTHER): Payer: Medicare Other | Admitting: Family Medicine

## 2021-09-05 ENCOUNTER — Other Ambulatory Visit: Payer: Self-pay

## 2021-09-05 VITALS — BP 143/80 | HR 63 | Temp 97.4°F | Ht 63.0 in | Wt 191.0 lb

## 2021-09-05 DIAGNOSIS — E1122 Type 2 diabetes mellitus with diabetic chronic kidney disease: Secondary | ICD-10-CM | POA: Diagnosis not present

## 2021-09-05 DIAGNOSIS — K219 Gastro-esophageal reflux disease without esophagitis: Secondary | ICD-10-CM | POA: Diagnosis not present

## 2021-09-05 DIAGNOSIS — E1129 Type 2 diabetes mellitus with other diabetic kidney complication: Secondary | ICD-10-CM | POA: Insufficient documentation

## 2021-09-05 DIAGNOSIS — N1831 Chronic kidney disease, stage 3a: Secondary | ICD-10-CM

## 2021-09-05 DIAGNOSIS — E781 Pure hyperglyceridemia: Secondary | ICD-10-CM | POA: Insufficient documentation

## 2021-09-05 DIAGNOSIS — I10 Essential (primary) hypertension: Secondary | ICD-10-CM

## 2021-09-05 DIAGNOSIS — D649 Anemia, unspecified: Secondary | ICD-10-CM | POA: Insufficient documentation

## 2021-09-05 DIAGNOSIS — D539 Nutritional anemia, unspecified: Secondary | ICD-10-CM | POA: Insufficient documentation

## 2021-09-05 DIAGNOSIS — E559 Vitamin D deficiency, unspecified: Secondary | ICD-10-CM

## 2021-09-05 MED ORDER — VITAMIN D (ERGOCALCIFEROL) 1.25 MG (50000 UNIT) PO CAPS: 50000.0000 [IU] | ORAL_CAPSULE | ORAL | 0 refills | Status: DC

## 2021-09-05 MED ORDER — FAMOTIDINE 40 MG PO TABS: 40.0000 mg | ORAL_TABLET | Freq: Every day | ORAL | 0 refills | Status: DC

## 2021-09-05 MED ORDER — LOSARTAN POTASSIUM 100 MG PO TABS: 100.0000 mg | ORAL_TABLET | Freq: Every morning | ORAL | 1 refills | Status: DC

## 2021-09-05 MED ORDER — GABAPENTIN 100 MG PO CAPS
100.0000 mg | ORAL_CAPSULE | Freq: Three times a day (TID) | ORAL | 1 refills | Status: DC
Start: 2021-09-05 — End: 2021-11-23

## 2021-09-05 MED ORDER — HYDROCHLOROTHIAZIDE 25 MG PO TABS: 25.0000 mg | ORAL_TABLET | Freq: Every day | ORAL | 0 refills | Status: DC

## 2021-09-05 MED ORDER — OMEPRAZOLE 20 MG PO CPDR: DELAYED_RELEASE_CAPSULE | ORAL | 1 refills | Status: DC

## 2021-09-05 MED ORDER — VENLAFAXINE HCL ER 75 MG PO CP24: ORAL_CAPSULE | ORAL | 1 refills | Status: DC

## 2021-09-05 MED ORDER — METOPROLOL SUCCINATE ER 100 MG PO TB24: ORAL_TABLET | ORAL | 1 refills | Status: DC

## 2021-09-05 NOTE — Assessment & Plan Note (Signed)
Starting on high-dose vitamin D.  Planning for 12 weeks of treatment.

## 2021-09-05 NOTE — Assessment & Plan Note (Signed)
BP stable.  Continue hydrochlorothiazide, losartan, metoprolol.

## 2021-09-05 NOTE — Progress Notes (Signed)
Subjective:  Patient ID: Kimberly [name], female    DOB: 06/14/1951  Age: 71 y.o. MRN: [id number]  CC: Chief Complaint  Patient presents with   Hypertension    HPI:  71 year old female with recent diagnosis of breast cancer, hypertension, hypothyroidism presents for follow-up.  Patient states that she is doing fairly well at this time.  Recently saw endocrinology regarding her type 2 diabetes and hypothyroidism.  Endocrinology is currently managing.  Hypothyroidism is stable on her current dose of levothyroxine.  Type 2 diabetes is currently uncontrolled.  Last A1c was 8.6.  She is compliant with metformin and glipizide.  Blood sugar readings have been around 200.  Patient has an upcoming visit with hematology/oncology to discuss further management of her breast cancer.  Recent vitamin D level was low.  We will discuss treatment today.  Additionally, patient was anemic on her recent blood work.  Needs additional labs for further valuation.  Patient's blood pressure is stable on metoprolol, losartan, and HCTZ.  Patient Active Problem List   Diagnosis Date Noted   Type 2 diabetes with kidney complications ([place]) 74/16/3845   Vitamin D deficiency 09/05/2021   Hypertriglyceridemia 09/05/2021   Macrocytic anemia 09/05/2021   Breast cancer of upper-outer quadrant of right female breast ([place]) 08/31/2021   S/P mastectomy, right 36/46/8032   Nonalcoholic steatohepatitis ([facility]) 11/29/2020   Urinary incontinence 05/07/2020   Depression 12/29/2015   Esophageal reflux 06/02/2014   S/P knee replacement 09/02/2013   Essential hypertension, benign 07/06/2013   Hypothyroidism 07/06/2013    Social Hx   Social History   Socioeconomic History   Marital status: Married    Spouse name: Not on file   Number of children: 5   Years of education: Not on file   Highest education level: Not on file  Occupational History   Occupation: CNA    Employer: [employer]  Tobacco Use   Smoking status:  Never   Smokeless tobacco: Never  Vaping Use   Vaping Use: Never used  Substance and Sexual Activity   Alcohol use: No   Drug use: No   Sexual activity: Yes    Birth control/protection: Surgical  Other Topics Concern   Not on file  Social History Narrative   Not on file   Social Determinants of Health   Financial Resource Strain: Not on file  Food Insecurity: Not on file  Transportation Needs: Not on file  Physical Activity: Not on file  Stress: Not on file  Social Connections: Not on file    Review of Systems  Constitutional: Negative.   Respiratory: Negative.    Cardiovascular: Negative.    Objective:  BP (!) 143/80    Pulse 63    Temp (!) 97.4 F (36.3 C)    Ht 5' 3"  (1.6 m)    Wt 191 lb (86.6 kg)    SpO2 98%    BMI 33.83 kg/m   BP/Weight 09/05/2021 08/29/2480 5/0/0370  Systolic BP 488 891 694  Diastolic BP 80 67 63  Wt. (Lbs) 191 194 196.4  BMI 33.83 34.37 34.79    Physical Exam Vitals and nursing note reviewed.  Constitutional:      General: She is not in acute distress.    Appearance: Normal appearance. She is obese.  HENT:     Head: Normocephalic and atraumatic.  Cardiovascular:     Rate and Rhythm: Normal rate and regular rhythm.  Pulmonary:     Effort: Pulmonary effort is normal.  Breath sounds: Normal breath sounds. No wheezing, rhonchi or rales.  Abdominal:     General: There is no distension.     Palpations: Abdomen is soft.     Tenderness: There is no abdominal tenderness.  Neurological:     Mental Status: She is alert.  Psychiatric:        Mood and Affect: Mood normal.        Behavior: Behavior normal.    Lab Results  Component Value Date   WBC 14.6 (H) 07/30/2021   HGB 10.3 (L) 07/30/2021   HCT 31.7 (L) 07/30/2021   PLT 296 07/30/2021   GLUCOSE 192 (H) 07/30/2021   CHOL 163 04/11/2021   TRIG 253 (H) 04/11/2021   HDL 42 04/11/2021   LDLCALC 79 04/11/2021   ALT 31 07/27/2021   AST 20 07/27/2021   NA 137 07/30/2021   K 4.1  07/30/2021   CL 103 07/30/2021   CREATININE 1.35 (H) 07/30/2021   BUN 34 (H) 07/30/2021   CO2 26 07/30/2021   TSH 0.653 04/11/2021   INR 1.04 09/04/2014   HGBA1C 8.6 (H) 07/27/2021   MICROALBUR 1.1 06/03/2014     Assessment & Plan:   Problem List Items Addressed This Visit       Cardiovascular and Mediastinum   Essential hypertension, benign - Primary    BP stable.  Continue hydrochlorothiazide, losartan, metoprolol.      Relevant Medications   hydrochlorothiazide (HYDRODIURIL) 25 MG tablet   losartan (COZAAR) 100 MG tablet   metoprolol succinate (TOPROL-XL) 100 MG 24 hr tablet   Other Relevant Orders   CMP14+EGFR     Digestive   Esophageal reflux   Relevant Medications   famotidine (PEPCID) 40 MG tablet   omeprazole (PRILOSEC) 20 MG capsule     Endocrine   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)   Relevant Medications   losartan (COZAAR) 100 MG tablet   Other Relevant Orders   CMP14+EGFR     Other   Vitamin D deficiency    Starting on high-dose vitamin D.  Planning for 12 weeks of treatment.      Macrocytic anemia    Labs for further evaluation today.      Relevant Orders   Vitamin B12   Iron, TIBC and Ferritin Panel   Folate    Meds ordered this encounter  Medications   famotidine (PEPCID) 40 MG tablet    Sig: Take 1 tablet (40 mg total) by mouth daily.    Dispense:  90 tablet    Refill:  0   gabapentin (NEURONTIN) 100 MG capsule    Sig: Take 1 capsule (100 mg total) by mouth 3 (three) times daily.    Dispense:  270 capsule    Refill:  1   hydrochlorothiazide (HYDRODIURIL) 25 MG tablet    Sig: Take 1 tablet (25 mg total) by mouth daily.    Dispense:  90 tablet    Refill:  0   losartan (COZAAR) 100 MG tablet    Sig: Take 1 tablet (100 mg total) by mouth every morning.    Dispense:  90 tablet    Refill:  1   metoprolol succinate (TOPROL-XL) 100 MG 24 hr tablet    Sig: TAKE 1 TABLET AT BEDTIME   WITH OR IMMEDIATELY        FOLLOWING A  MEAL    Dispense:  90 tablet    Refill:  1   omeprazole (PRILOSEC) 20 MG capsule  Sig: Take one capsule po qhs    Dispense:  90 capsule    Refill:  1   venlafaxine XR (EFFEXOR-XR) 75 MG 24 hr capsule    Sig: TAKE 1 CAPSULE DAILY WITH  BREAKFAST    Dispense:  90 capsule    Refill:  1   Vitamin D, Ergocalciferol, (DRISDOL) 1.25 MG (50000 UNIT) CAPS capsule    Sig: Take 1 capsule (50,000 Units total) by mouth every 7 (seven) days.    Dispense:  12 capsule    Refill:  0    Follow-up:  6 months  [place] DO [place]

## 2021-09-05 NOTE — Patient Instructions (Signed)
Labs today.  Medications refilled.  Start the Vitamin D.  Follow up in 6 months.  Take care  Dr. Lacinda Axon

## 2021-09-05 NOTE — Assessment & Plan Note (Signed)
Further evaluation with labs today -iron panel, vitamin B12, and folate

## 2021-09-05 NOTE — Assessment & Plan Note (Signed)
Labs for further evaluation today.

## 2021-09-06 LAB — CMP14+EGFR
ALT: 20 IU/L (ref 0–32)
AST: 19 IU/L (ref 0–40)
Albumin/Globulin Ratio: 1.7 (ref 1.2–2.2)
Albumin: 4.7 g/dL (ref 3.8–4.8)
Alkaline Phosphatase: 104 IU/L (ref 44–121)
BUN/Creatinine Ratio: 25 (ref 12–28)
BUN: 25 mg/dL (ref 8–27)
Bilirubin Total: 0.4 mg/dL (ref 0.0–1.2)
CO2: 24 mmol/L (ref 20–29)
Calcium: 10.3 mg/dL (ref 8.7–10.3)
Chloride: 98 mmol/L (ref 96–106)
Creatinine, Ser: 1 mg/dL (ref 0.57–1.00)
Globulin, Total: 2.7 g/dL (ref 1.5–4.5)
Glucose: 164 mg/dL — ABNORMAL HIGH (ref 70–99)
Potassium: 4.4 mmol/L (ref 3.5–5.2)
Sodium: 135 mmol/L (ref 134–144)
Total Protein: 7.4 g/dL (ref 6.0–8.5)
eGFR: 61 mL/min/{1.73_m2} (ref >59)

## 2021-09-06 LAB — VITAMIN B12: Vitamin B-12: 474 pg/mL (ref 232–1245)

## 2021-09-06 LAB — IRON,TIBC AND FERRITIN PANEL
Ferritin: 73 ng/mL (ref 15–150)
Iron Saturation: 20 % (ref 15–55)
Iron: 73 ug/dL (ref 27–139)
Total Iron Binding Capacity: 365 ug/dL (ref 250–450)
UIBC: 292 ug/dL (ref 118–369)

## 2021-09-06 LAB — FOLATE: Folate: 4.9 ng/mL (ref >3.0)

## 2021-09-12 ENCOUNTER — Other Ambulatory Visit: Payer: Self-pay

## 2021-09-12 ENCOUNTER — Ambulatory Visit (HOSPITAL_COMMUNITY)
Admission: RE | Admit: 2021-09-12 | Discharge: 2021-09-12 | Disposition: A | Discharge status: Home / Self Care | Payer: Medicare Other | Source: Ambulatory Visit | Attending: Hematology | Admitting: Hematology

## 2021-09-12 DIAGNOSIS — Z170 Estrogen receptor positive status [ER+]: Secondary | ICD-10-CM | POA: Diagnosis not present

## 2021-09-12 DIAGNOSIS — Z780 Asymptomatic menopausal state: Secondary | ICD-10-CM | POA: Diagnosis not present

## 2021-09-12 DIAGNOSIS — N959 Unspecified menopausal and perimenopausal disorder: Secondary | ICD-10-CM | POA: Diagnosis not present

## 2021-09-12 DIAGNOSIS — C50411 Malignant neoplasm of upper-outer quadrant of right female breast: Secondary | ICD-10-CM | POA: Insufficient documentation

## 2021-09-14 DIAGNOSIS — C50411 Malignant neoplasm of upper-outer quadrant of right female breast: Secondary | ICD-10-CM | POA: Diagnosis not present

## 2021-09-14 DIAGNOSIS — Z170 Estrogen receptor positive status [ER+]: Secondary | ICD-10-CM | POA: Diagnosis not present

## 2021-09-15 ENCOUNTER — Ambulatory Visit (HOSPITAL_COMMUNITY): Payer: Medicare Other | Admitting: Hematology

## 2021-09-19 ENCOUNTER — Other Ambulatory Visit: Payer: Self-pay

## 2021-09-19 ENCOUNTER — Inpatient Hospital Stay (HOSPITAL_BASED_OUTPATIENT_CLINIC_OR_DEPARTMENT_OTHER): Payer: Medicare Other | Admitting: Hematology

## 2021-09-19 VITALS — BP 145/77 | HR 59 | Temp 96.8°F | Resp 18 | Ht 61.81 in | Wt 194.0 lb

## 2021-09-19 DIAGNOSIS — Z170 Estrogen receptor positive status [ER+]: Secondary | ICD-10-CM | POA: Diagnosis not present

## 2021-09-19 DIAGNOSIS — I10 Essential (primary) hypertension: Secondary | ICD-10-CM | POA: Diagnosis not present

## 2021-09-19 DIAGNOSIS — C50411 Malignant neoplasm of upper-outer quadrant of right female breast: Secondary | ICD-10-CM

## 2021-09-19 DIAGNOSIS — E119 Type 2 diabetes mellitus without complications: Secondary | ICD-10-CM | POA: Diagnosis not present

## 2021-09-19 DIAGNOSIS — Z8673 Personal history of transient ischemic attack (TIA), and cerebral infarction without residual deficits: Secondary | ICD-10-CM | POA: Diagnosis not present

## 2021-09-19 DIAGNOSIS — Z9011 Acquired absence of right breast and nipple: Secondary | ICD-10-CM | POA: Diagnosis not present

## 2021-09-19 MED ORDER — ANASTROZOLE 1 MG PO TABS: 1.0000 mg | ORAL_TABLET | Freq: Every day | ORAL | 3 refills | Status: DC

## 2021-09-19 NOTE — Patient Instructions (Addendum)
[place] at [facility] Discharge Instructions   You were seen and examined today by Dr. Delton Coombes.  He reviewed the results of your OncoType Dx test.  Your score indicated you would not benefit from chemotherapy.  He also reviewed the results of your bone density test, which was good.   We sent a pill to your pharmacy called Arimidex.  It is an estrogen blocker pill that will help prevent your cancer from coming back.  You will need to take this medication daily at the same time every day.  You will be on this medication for at least 5 years.  Return as scheduled for lab work and office visit.      Thank you for choosing [place] at [facility] to provide your oncology and hematology care.  To afford each patient quality time with our provider, please arrive at least 15 minutes before your scheduled appointment time.   If you have a lab appointment with the [facility] please come in thru the Main Entrance and check in at the main information desk.  You need to re-schedule your appointment should you arrive 10 or more minutes late.  We strive to give you quality time with our providers, and arriving late affects you and other patients whose appointments are after yours.  Also, if you no show three or more times for appointments you may be dismissed from the clinic at the providers discretion.     Again, thank you for choosing [facility].  Our hope is that these requests will decrease the amount of time that you wait before being seen by our physicians.       _____________________________________________________________  Should you have questions after your visit to [facility], please contact our office at [phone] and follow the prompts.  Our office hours are 8:00 a.m. and 4:30 p.m. Monday - Friday.  Please note that voicemails left after 4:00 p.m. may not be returned until the following business day.  We  are closed weekends and major holidays.  You do have access to a nurse 24-7, just call the main number to the clinic [phone] and do not press any options, hold on the line and a nurse will answer the phone.    For prescription refill requests, have your pharmacy contact our office and allow 72 hours.    Due to Covid, you will need to wear a mask upon entering the hospital. If you do not have a mask, a mask will be given to you at the Main Entrance upon arrival. For doctor visits, patients may have 1 support person age 36 or older with them. For treatment visits, patients can not have anyone with them due to social distancing guidelines and our immunocompromised population.

## 2021-09-19 NOTE — Progress Notes (Signed)
[facility] [address] [place] [address]   Patient Care Team: Coral Spikes, DO as PCP - General (Family Medicine) Derek Jack, MD as Medical Oncologist (Medical Oncology)  SUMMARY OF ONCOLOGIC HISTORY: Oncology History   No history exists.    CHIEF COMPLIANT: Follow-up of right breast cancer   INTERVAL HISTORY: [name] is a 71 y.o. female here today for follow up of her right breast cancer. Her last visit was on 08/31/2021.   Today she reports feeling well, and she is accompanied by her daughter. She is agreeable to starting antiestrogen therapy with anastrozole. She reports occasional hot flashes. She reports chest wall tenderness following her mastectomy.   REVIEW OF SYSTEMS:   Review of Systems  Constitutional:  Negative for appetite change and fatigue.  Cardiovascular:  Positive for chest pain (chest wall tender from mastectomy).  Endocrine: Positive for hot flashes (night sweats).  All other systems reviewed and are negative.  I have reviewed the past medical history, past surgical history, social history and family history with the patient and they are unchanged from previous note.   ALLERGIES:   is allergic to ace inhibitors.   MEDICATIONS:  Current Outpatient Medications  Medication Sig Dispense Refill   anastrozole (ARIMIDEX) 1 MG tablet Take 1 tablet (1 mg total) by mouth daily. 90 tablet 3   diclofenac Sodium (VOLTAREN) 1 % GEL Apply 2 g topically daily as needed (knee pain).     famotidine (PEPCID) 40 MG tablet Take 1 tablet (40 mg total) by mouth daily. 90 tablet 0   gabapentin (NEURONTIN) 100 MG capsule Take 1 capsule (100 mg total) by mouth 3 (three) times daily. 270 capsule 1   glipiZIDE (GLUCOTROL XL) 5 MG 24 hr tablet Take 1 tablet (5 mg total) by mouth daily with breakfast. 90 tablet 1   hydrochlorothiazide (HYDRODIURIL) 25 MG tablet Take 1 tablet (25 mg total) by mouth daily. 90 tablet 0   levothyroxine  (SYNTHROID) 125 MCG tablet TAKE 1 TABLET ONCE DAILY   BEFORE BREAKFAST 90 tablet 0   losartan (COZAAR) 100 MG tablet Take 1 tablet (100 mg total) by mouth every morning. 90 tablet 1   metFORMIN (GLUCOPHAGE-XR) 500 MG 24 hr tablet Take 1,000 mg by mouth daily with breakfast.     metoprolol succinate (TOPROL-XL) 100 MG 24 hr tablet TAKE 1 TABLET AT BEDTIME   WITH OR IMMEDIATELY        FOLLOWING A MEAL 90 tablet 1   omeprazole (PRILOSEC) 20 MG capsule Take one capsule po qhs 90 capsule 1   tolterodine (DETROL LA) 4 MG 24 hr capsule Take 1 capsule (4 mg total) by mouth daily. 90 capsule 3   venlafaxine XR (EFFEXOR-XR) 75 MG 24 hr capsule TAKE 1 CAPSULE DAILY WITH  BREAKFAST 90 capsule 1   Vitamin D, Ergocalciferol, (DRISDOL) 1.25 MG (50000 UNIT) CAPS capsule Take 1 capsule (50,000 Units total) by mouth every 7 (seven) days. 12 capsule 0   No current facility-administered medications for this visit.   Facility-Administered Medications Ordered in Other Visits  Medication Dose Route Frequency Provider Last Rate Last Admin   bupivacaine liposome (EXPAREL) 1.3 % injection 266 mg  20 mL Infiltration Once Carole Civil, MD         PHYSICAL EXAMINATION: Performance status (ECOG): 1 - Symptomatic but completely ambulatory  Vitals:   09/19/21 1024  BP: (!) 145/77  Pulse: (!) 59  Resp: 18  Temp: Marland Kitchen)  96.8 F (36 C)  SpO2: 96%   Wt Readings from Last 3 Encounters:  09/19/21 194 lb (88 kg)  09/05/21 191 lb (86.6 kg)  09/01/21 194 lb (88 kg)   Physical Exam Vitals reviewed.  Constitutional:      Appearance: Normal appearance.  Cardiovascular:     Rate and Rhythm: Normal rate and regular rhythm.     Pulses: Normal pulses.     Heart sounds: Normal heart sounds.  Pulmonary:     Effort: Pulmonary effort is normal.     Breath sounds: Normal breath sounds.  Neurological:     General: No focal deficit present.     Mental Status: She is alert and oriented to person, place, and time.   Psychiatric:        Mood and Affect: Mood normal.        Behavior: Behavior normal.    Breast Exam Chaperone: Thana Ates     LABORATORY DATA:  I have reviewed the data as listed CMP Latest Ref Rng & Units 09/05/2021 07/30/2021 07/27/2021  Glucose 70 - 99 mg/dL 164(H) 192(H) 139(H)  BUN 8 - 27 mg/dL 25 34(H) 21  Creatinine 0.57 - 1.00 mg/dL 1.00 1.35(H) 1.00  Sodium 134 - 144 mmol/L 135 137 138  Potassium 3.5 - 5.2 mmol/L 4.4 4.1 3.5  Chloride 96 - 106 mmol/L 98 103 103  CO2 20 - 29 mmol/L _0 Calcium 8.7 - 10.3 mg/dL 10.3 8.9 9.3  Total Protein 6.0 - 8.5 g/dL 7.4 - 7.4  Total Bilirubin 0.0 - 1.2 mg/dL 0.4 - 0.5  Alkaline Phos 44 - 121 IU/L 104 - 67  AST 0 - 40 IU/L 19 - 20  ALT 0 - 32 IU/L 20 - 31   No results found for: VQQ595 Lab Results  Component Value Date   WBC 14.6 (H) 07/30/2021   HGB 10.3 (L) 07/30/2021   HCT 31.7 (L) 07/30/2021   MCV 101.9 (H) 07/30/2021   PLT 296 07/30/2021   NEUTROABS 3.0 07/27/2021    ASSESSMENT:  Stage IIa (T1CN1) right breast upper outer quadrant IDC: - Abnormal mammogram on 06/02/2021. - Right breast 9:00 mass biopsy on 06/30/2021, invasive ductal carcinoma, ER 70% positive, PR negative, Ki-67 2%, HER2 1+.  Right axillary lymph node biopsy was positive for metastatic carcinoma. - She met with Dr. Marla Roe for possible reconstruction but decided against it. - She underwent right breast mastectomy with lymph node biopsy on 07/29/2021. - Pathology showed 1.1 cm grade 2 IDC, associated DCIS, margins negative.  Metastatic carcinoma involving 1/9 lymph nodes.  PT1CPN1A. - Oncotype DX recurrence score 22    Social/family history: - She lives at home with her husband.  Today she is seen with her daughter. 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  Non-smoker. - Father had squamous cell carcinoma of the lip which has metastasized.  Maternal grandmother had colon cancer.  Paternal uncle had prostate cancer and  paternal aunt had breast cancer.   PLAN:  Stage IIa (T1CN1) right breast IDC, ER positive, PR/HER2 negative: - We have discussed results of Oncotype DX with recurrence score 22. - There is no advantage with chemotherapy unless recurrence score is more than 25.  Hence I did not recommend chemotherapy.  I have recommended antiestrogen therapy with anastrozole. - We discussed side effects in detail. - RTC 4 months for follow-up with repeat labs and physical exam. - We will also consider genetic testing.  2.  Bone health: - DEXA scan shows T score -0.5 on 09/12/2021. - Vitamin D level is 15.6.  Dr. Lacinda Axon started her on vitamin D 50,000 units weekly. - We will plan to repeat vitamin D in 4 months.   Breast Cancer therapy associated bone loss: I have recommended calcium, Vitamin D and weight bearing exercises.  Orders placed this encounter:  No orders of the defined types were placed in this encounter.   The patient has a good understanding of the overall plan. She agrees with it. She will call with any problems that may develop before the next visit here.  Derek Jack, MD [place] [phone]   I, Thana Ates, am acting as a scribe for Dr. Derek Jack.  I, Derek Jack MD, have reviewed the above documentation for accuracy and completeness, and I agree with the above.

## 2021-09-20 ENCOUNTER — Other Ambulatory Visit (HOSPITAL_COMMUNITY): Payer: Self-pay | Admitting: *Deleted

## 2021-09-20 DIAGNOSIS — E559 Vitamin D deficiency, unspecified: Secondary | ICD-10-CM

## 2021-09-20 DIAGNOSIS — Z170 Estrogen receptor positive status [ER+]: Secondary | ICD-10-CM

## 2021-10-03 ENCOUNTER — Other Ambulatory Visit: Payer: Self-pay

## 2021-10-03 ENCOUNTER — Encounter: Payer: Self-pay | Admitting: Nurse Practitioner

## 2021-10-03 ENCOUNTER — Ambulatory Visit (INDEPENDENT_AMBULATORY_CARE_PROVIDER_SITE_OTHER): Payer: Medicare Other | Admitting: Nurse Practitioner

## 2021-10-03 VITALS — BP 122/70 | HR 71 | Ht 61.81 in | Wt 193.0 lb

## 2021-10-03 DIAGNOSIS — I10 Essential (primary) hypertension: Secondary | ICD-10-CM | POA: Diagnosis not present

## 2021-10-03 DIAGNOSIS — E039 Hypothyroidism, unspecified: Secondary | ICD-10-CM | POA: Diagnosis not present

## 2021-10-03 DIAGNOSIS — E782 Mixed hyperlipidemia: Secondary | ICD-10-CM | POA: Diagnosis not present

## 2021-10-03 DIAGNOSIS — E1121 Type 2 diabetes mellitus with diabetic nephropathy: Secondary | ICD-10-CM | POA: Diagnosis not present

## 2021-10-03 MED ORDER — TRESIBA FLEXTOUCH 100 UNIT/ML ~~LOC~~ SOPN: 18.0000 [IU] | PEN_INJECTOR | Freq: Every day | SUBCUTANEOUS | 3 refills | Status: DC

## 2021-10-03 MED ORDER — BD PEN NEEDLE SHORT U/F 31G X 8 MM MISC: 1.0000 | 3 refills | Status: DC

## 2021-10-03 NOTE — Progress Notes (Signed)
Endocrinology Follow Up Note       10/03/2021, 1:53 PM   Subjective:    Patient ID: Kimberly [name], female    DOB: 1951-08-18.  Kimberly [name] is being seen in follow up after being seen in consultation for management of currently uncontrolled symptomatic diabetes requested by  Coral Spikes, DO.   Past Medical History:  Diagnosis Date   Anxiety    Arthritis    Depression    Diabetes mellitus    GERD (gastroesophageal reflux disease)    High blood pressure    Hypothyroid    Obesity    PONV (postoperative nausea and vomiting)     Past Surgical History:  Procedure Laterality Date   ABDOMINAL HYSTERECTOMY     [place] Right    CARPAL TUNNEL RELEASE Right    KNEE ARTHROSCOPY Right    MASTECTOMY MODIFIED RADICAL Right 07/29/2021   Procedure: MASTECTOMY MODIFIED RADICAL;  Surgeon: Aviva Signs, MD;  Location: [facility];  Service: General;  Laterality: Right;   THYROID SURGERY     removal   TOTAL KNEE ARTHROPLASTY Right 08/25/2013   Procedure: TOTAL KNEE ARTHROPLASTY;  Surgeon: Carole Civil, MD;  Location: [facility];  Service: Orthopedics;  Laterality: Right;   TOTAL KNEE ARTHROPLASTY Left 09/09/2014   Procedure: LEFT TOTAL KNEE ARTHROPLASTY;  Surgeon: Carole Civil, MD;  Location: [facility];  Service: Orthopedics;  Laterality: Left;    Social History   Socioeconomic History   Marital status: Married    Spouse name: Not on file   Number of children: 5   Years of education: Not on file   Highest education level: Not on file  Occupational History   Occupation: CNA    Employer: [employer]  Tobacco Use   Smoking status: Never   Smokeless tobacco: Never  Vaping Use   Vaping Use: Never used  Substance and Sexual Activity   Alcohol use: No   Drug use: No   Sexual activity: Yes    Birth control/protection: Surgical  Other Topics Concern   Not on file  Social History Narrative   Not on file    Social Determinants of Health   Financial Resource Strain: Not on file  Food Insecurity: Not on file  Transportation Needs: Not on file  Physical Activity: Not on file  Stress: Not on file  Social Connections: Not on file    Family History  Problem Relation Age of Onset   Congestive Heart Failure Father    Hypertension Father    Diabetes Mother    Hypertension Mother    Hypertension Brother    Cancer Other    Diabetes Other     Outpatient Encounter Medications as of 10/03/2021  Medication Sig   anastrozole (ARIMIDEX) 1 MG tablet Take 1 tablet (1 mg total) by mouth daily.   Calcium-Vitamin D 500-3.125 MG-MCG TABS Take by mouth. Takes 2 daily   diclofenac Sodium (VOLTAREN) 1 % GEL Apply 2 g topically daily as needed (knee pain).   famotidine (PEPCID) 40 MG tablet Take 1 tablet (40 mg total) by mouth daily.   gabapentin (NEURONTIN) 100 MG capsule Take 1 capsule (100 mg total) by mouth  3 (three) times daily.   glipiZIDE (GLUCOTROL XL) 5 MG 24 hr tablet Take 1 tablet (5 mg total) by mouth daily with breakfast.   hydrochlorothiazide (HYDRODIURIL) 25 MG tablet Take 1 tablet (25 mg total) by mouth daily.   insulin degludec (TRESIBA FLEXTOUCH) 100 UNIT/ML FlexTouch Pen Inject 18 Units into the skin at bedtime.   Insulin Pen Needle (B-D ULTRAFINE III SHORT PEN) 31G X 8 MM MISC 1 each by Does not apply route as directed. Use to inject insulin once daily   levothyroxine (SYNTHROID) 125 MCG tablet TAKE 1 TABLET ONCE DAILY   BEFORE BREAKFAST   losartan (COZAAR) 100 MG tablet Take 1 tablet (100 mg total) by mouth every morning.   metFORMIN (GLUCOPHAGE-XR) 500 MG 24 hr tablet Take 1,000 mg by mouth daily with breakfast.   metoprolol succinate (TOPROL-XL) 100 MG 24 hr tablet TAKE 1 TABLET AT BEDTIME   WITH OR IMMEDIATELY        FOLLOWING A MEAL   omeprazole (PRILOSEC) 20 MG capsule Take one capsule po qhs   tolterodine (DETROL LA) 4 MG 24 hr capsule Take 1 capsule (4 mg total) by mouth daily.    venlafaxine XR (EFFEXOR-XR) 75 MG 24 hr capsule TAKE 1 CAPSULE DAILY WITH  BREAKFAST   Vitamin D, Ergocalciferol, (DRISDOL) 1.25 MG (50000 UNIT) CAPS capsule Take 1 capsule (50,000 Units total) by mouth every 7 (seven) days.   Facility-Administered Encounter Medications as of 10/03/2021  Medication   bupivacaine liposome (EXPAREL) 1.3 % injection 266 mg    ALLERGIES: Allergies  Allergen Reactions   Ace Inhibitors Cough    VACCINATION STATUS: Immunization History  Administered Date(s) Administered   Fluad Quad(high Dose 65+) 07/12/2020   Influenza,inj,Quad PF,6+ Mos 04/30/2018   Influenza-Unspecified 05/21/2016, 05/03/2017, 06/21/2019   Moderna Sars-Covid-2 Vaccination 10/07/2019, 11/05/2019, 08/02/2020   Pneumococcal Conjugate-13 03/03/2014, 05/30/2017   Pneumococcal Polysaccharide-23 05/27/2010    Diabetes She presents for her follow-up diabetic visit. She has type 2 diabetes mellitus. Onset time: Diagnosed at approx age of 59. Her disease course has been worsening. There are no hypoglycemic associated symptoms. Pertinent negatives for hypoglycemia include no nervousness/anxiousness or tremors. Associated symptoms include fatigue and foot paresthesias. Pertinent negatives for diabetes include no polydipsia, no polyuria and no weight loss. There are no hypoglycemic complications. Symptoms are stable. Diabetic complications include nephropathy and peripheral neuropathy. Risk factors for coronary artery disease include diabetes mellitus, dyslipidemia, family history, obesity, post-menopausal and hypertension. Current diabetic treatment includes oral agent (dual therapy). She is compliant with treatment most of the time. Her weight is stable. She is following a generally healthy diet. Meal planning includes avoidance of concentrated sweets. She has had a previous visit with a dietitian. She participates in exercise three times a week. Her home blood glucose trend is increasing steadily. Her  breakfast blood glucose range is generally 180-200 mg/dl. Her bedtime blood glucose range is generally >200 mg/dl. (She presents today with her logs, no meter, showing increasing fasting glucose readings despite eating healthy and healing from her recent mastectomy.  She was not due for another A1c today.  She denies any hypoglycemia.  She drinks only water and rarely eats snacks.) An ACE inhibitor/angiotensin II receptor blocker is being taken. She sees a podiatrist (has seen one in the distant past).Eye exam is current.  Hyperlipidemia This is a chronic problem. The current episode started more than 1 year ago. The problem is uncontrolled. Recent lipid tests were reviewed and are variable. Exacerbating diseases include chronic  renal disease, diabetes, hypothyroidism and obesity. Factors aggravating her hyperlipidemia include thiazides and beta blockers. Current antihyperlipidemic treatment includes diet change and exercise. The current treatment provides mild improvement of lipids. There are no compliance problems.  Risk factors for coronary artery disease include diabetes mellitus, dyslipidemia, family history, hypertension, obesity and post-menopausal.  Hypertension This is a chronic problem. The current episode started more than 1 year ago. The problem has been gradually improving since onset. The problem is controlled. Pertinent negatives include no palpitations. Agents associated with hypertension include thyroid hormones. Risk factors for coronary artery disease include diabetes mellitus, dyslipidemia, family history, obesity and post-menopausal state. Past treatments include angiotensin blockers, beta blockers and diuretics. The current treatment provides moderate improvement. There are no compliance problems.  Hypertensive end-organ damage includes kidney disease. Identifiable causes of hypertension include chronic renal disease and a thyroid problem.  Thyroid Problem Presents for follow-up visit.  Symptoms include fatigue. Patient reports no anxiety, cold intolerance, constipation, depressed mood, heat intolerance, leg swelling, palpitations, tremors, weight gain or weight loss. The symptoms have been stable. Past treatments include levothyroxine. The treatment provided significant relief. Her past medical history is significant for diabetes, hyperlipidemia and neuropathy (? vs chronic back issues).    Review of systems  Constitutional: + stable body weight, current Body mass index is 35.52 kg/m., + fatigue, no subjective hyperthermia, no subjective hypothermia Eyes: no blurry vision, no xerophthalmia ENT: no sore throat, no nodules palpated in throat, no dysphagia/odynophagia, no hoarseness Cardiovascular: no chest pain, no shortness of breath, no palpitations, no leg swelling Respiratory: no cough, no shortness of breath Gastrointestinal: no nausea/vomiting/diarrhea Musculoskeletal: no muscle/joint aches- recent right mastectomy Skin: no rashes, no hyperemia Neurological: no tremors, + numbness/tingling to BLE, no dizziness Psychiatric: no depression, no anxiety  Objective:     BP 122/70    Pulse 71    Ht 5' 1.81" (1.57 m)    Wt 193 lb (87.5 kg)    SpO2 97%    BMI 35.52 kg/m   Wt Readings from Last 3 Encounters:  10/03/21 193 lb (87.5 kg)  09/19/21 194 lb (88 kg)  09/05/21 191 lb (86.6 kg)     BP Readings from Last 3 Encounters:  10/03/21 122/70  09/19/21 (!) 145/77  09/05/21 (!) 143/80      Physical Exam- Limited  Constitutional:  Body mass index is 35.52 kg/m. , not in acute distress, normal state of mind Eyes:  EOMI, no exophthalmos Neck: Supple Cardiovascular: RRR, no murmurs, rubs, or gallops, no edema Respiratory: Adequate breathing efforts, no crackles, rales, rhonchi, or wheezing Musculoskeletal: no gross deformities, strength intact in all four extremities, no gross restriction of joint movements Skin:  no rashes, no hyperemia Neurological: no tremor  with outstretched hands   Diabetic Foot Exam - Simple   No data filed      CMP ( most recent) CMP     Component Value Date/Time   NA 135 09/05/2021 1012   K 4.4 09/05/2021 1012   CL 98 09/05/2021 1012   CO2 24 09/05/2021 1012   GLUCOSE 164 (H) 09/05/2021 1012   GLUCOSE 192 (H) 07/30/2021 0354   BUN 25 09/05/2021 1012   CREATININE 1.00 09/05/2021 1012   CREATININE 1.06 08/11/2014 0701   CALCIUM 10.3 09/05/2021 1012   PROT 7.4 09/05/2021 1012   ALBUMIN 4.7 09/05/2021 1012   AST 19 09/05/2021 1012   ALT 20 09/05/2021 1012   ALKPHOS 104 09/05/2021 1012   BILITOT 0.4 09/05/2021 1012  GFRNONAA 43 (L) 07/30/2021 0354   GFRAA 65 10/11/2020 1146     Diabetic Labs (most recent): Lab Results  Component Value Date   HGBA1C 8.6 (H) 07/27/2021   HGBA1C 7.6 (H) 04/11/2021   HGBA1C 7.3 (H) 10/11/2020     Lipid Panel ( most recent) Lipid Panel     Component Value Date/Time   CHOL 163 04/11/2021 1042   TRIG 253 (H) 04/11/2021 1042   HDL 42 04/11/2021 1042   CHOLHDL 3.9 04/11/2021 1042   CHOLHDL 3.7 06/03/2014 0709   VLDL 40 06/03/2014 0709   LDLCALC 79 04/11/2021 1042   LABVLDL 42 (H) 04/11/2021 1042      Lab Results  Component Value Date   TSH 0.653 04/11/2021   TSH 1.490 04/19/2020   TSH 2.470 07/31/2019   TSH 0.528 04/22/2018   TSH 0.256 (L) 12/26/2017   TSH 1.030 07/08/2015   TSH 3.790 11/11/2014   TSH 0.177 (L) 06/03/2014   TSH 1.697 07/09/2013           Assessment & Plan:   1) Type 2 diabetes mellitus with diabetic nephropathy, without long-term current use of insulin ([place])  She presents today with her logs, no meter, showing increasing fasting glucose readings despite eating healthy and healing from her recent mastectomy.  She was not due for another A1c today.  She denies any hypoglycemia.  She drinks only water and rarely eats snacks.  - Kimberly [name] has currently uncontrolled symptomatic type 2 DM since 71 years of age.  -Recent labs  reviewed.  - I had a long discussion with her about the progressive nature of diabetes and the pathology behind its complications. -her diabetes is complicated by mild CKD, peripheral neuropathy and she remains at a high risk for more acute and chronic complications which include CAD, CVA, CKD, retinopathy, and neuropathy. These are all discussed in detail with her.  - Nutritional counseling repeated at each appointment due to patients tendency to fall back in to old habits.  - The patient admits there is a room for improvement in their diet and drink choices. -  Suggestion is made for the patient to avoid simple carbohydrates from their diet including Cakes, Sweet Desserts / Pastries, Ice Cream, Soda (diet and regular), Sweet Tea, Candies, Chips, Cookies, Sweet Pastries, Store Bought Juices, Alcohol in Excess of 1-2 drinks a day, Artificial Sweeteners, Coffee Creamer, and "Sugar-free" Products. This will help patient to have stable blood glucose profile and potentially avoid unintended weight gain.   - I encouraged the patient to switch to unprocessed or minimally processed complex starch and increased protein intake (animal or plant source), fruits, and vegetables.   - Patient is advised to stick to a routine mealtimes to eat 3 meals a day and avoid unnecessary snacks (to snack only to correct hypoglycemia).  - she declines referral to Jearld Fenton, RDN, CDE for diabetes education at this time.  - I have approached her with the following individualized plan to manage her diabetes and patient agrees:   She is tolerating the ER form of Metformin better, is advised to continue 1000 mg ER daily with breakfast and continue Glipizide 5 mg XL daily with breakfast.  Given her rising numbers, I discussed and initiated basal insulin with Tresiba 18 units SQ nightly.  We went over proper injection technique in the room today.  I will have her return in 1 month to review readings and adjust medications  appropriately.  -she is encouraged to continue  monitoring blood glucose twice daily, before breakfast and before bed, and to call the clinic if she has readings less than 70 or above 200 for 3 tests in a row.   - she will be considered for incretin therapy as appropriate next visit.  - Specific targets for  A1c; LDL, HDL, and Triglycerides were discussed with the patient.  2) Blood Pressure /Hypertension:  her blood pressure is controlled to target.   she is advised to continue her current medications including HCTZ 25 mg po daily, Losartan 100 mg p.o. daily with breakfast, and Metoprolol 100 mg po daily.  3) Lipids/Hyperlipidemia:    Review of her recent lipid panel from 04/11/21 showed controlled LDL at 79 and elevated triglycerides of 253.  She prefers to stay away from statins at this time.  She is advised to avoid fried foods and butter.   4)  Weight/Diet:  her Body mass index is 35.52 kg/m.  -  clearly complicating her diabetes care.   she is a candidate for weight loss. I discussed with her the fact that loss of 5 - 10% of her  current body weight will have the most impact on her diabetes management.  Exercise, and detailed carbohydrates information provided  -  detailed on discharge instructions.  5) Vitamin D Deficiency Her recent vitamin D level was 15.64 on 08/31/21- checked by her oncologist.  She was initiated on Ergocalciferol 50000 units weekly.  6) Hypothyroidism-unspecified There are no recent TFTs to review.  She is advised to continue Levothyroxine 125 mcg po daily before breakfast.  Will recheck TFTs prior to next visit and adjust dose if needed.   - The correct intake of thyroid hormone (Levothyroxine, Synthroid), is on empty stomach first thing in the morning, with water, separated by at least 30 minutes from breakfast and other medications,  and separated by more than 4 hours from calcium, iron, multivitamins, acid reflux medications (PPIs).  - This medication is a  life-long medication and will be needed to correct thyroid hormone imbalances for the rest of your life.  The dose may change from time to time, based on thyroid blood work.  - It is extremely important to be consistent taking this medication, near the same time each morning.  -AVOID TAKING PRODUCTS CONTAINING BIOTIN (commonly found in Hair, Skin, Nails vitamins) AS IT INTERFERES WITH THE VALIDITY OF THYROID FUNCTION BLOOD TESTS.  6) Chronic Care/Health Maintenance: -she is on ACEI/ARB medications and is encouraged to initiate and continue to follow up with Ophthalmology, Dentist, Podiatrist at least yearly or according to recommendations, and advised to stay away from smoking. I have recommended yearly flu vaccine and pneumonia vaccine at least every 5 years; moderate intensity exercise for up to 150 minutes weekly; and sleep for at least 7 hours a day.  - she is advised to maintain close follow up with Coral Spikes, DO for primary care needs, as well as her other providers for optimal and coordinated care.      I spent 43 minutes in the care of the patient today including review of labs from [facility], Lipids, Thyroid Function, Hematology (current and previous including abstractions from other facilities); face-to-face time discussing  her blood glucose readings/logs, discussing hypoglycemia and hyperglycemia episodes and symptoms, medications doses, her options of short and long term treatment based on the latest standards of care / guidelines;  discussion about incorporating lifestyle medicine;  and documenting the encounter.    Please refer to Patient Instructions for Blood  Glucose Monitoring and Insulin/Medications Dosing Guide"  in media tab for additional information. Please  also refer to " Patient Self Inventory" in the Media  tab for reviewed elements of pertinent patient history.  Kimberly [name] participated in the discussions, expressed understanding, and voiced agreement with the above  plans.  All questions were answered to her satisfaction. she is encouraged to contact clinic should she have any questions or concerns prior to her return visit.     Follow up plan: - Return in about 1 month (around 10/31/2021) for Diabetes F/U with A1c in office, Thyroid follow up, Previsit labs, Bring meter and logs.   Rayetta Pigg, [facility] [address] [place], [place] [address] Phone: [phone] Fax: [phone]  10/03/2021, 1:53 PM

## 2021-10-03 NOTE — Patient Instructions (Signed)

## 2021-10-04 LAB — COMPREHENSIVE METABOLIC PANEL
ALT: 30 IU/L (ref 0–32)
AST: 25 IU/L (ref 0–40)
Albumin/Globulin Ratio: 1.3 (ref 1.2–2.2)
Albumin: 4.3 g/dL (ref 3.8–4.8)
Alkaline Phosphatase: 96 IU/L (ref 44–121)
BUN/Creatinine Ratio: 20 (ref 12–28)
BUN: 20 mg/dL (ref 8–27)
Bilirubin Total: 0.3 mg/dL (ref 0.0–1.2)
CO2: 23 mmol/L (ref 20–29)
Calcium: 10.1 mg/dL (ref 8.7–10.3)
Chloride: 100 mmol/L (ref 96–106)
Creatinine, Ser: 0.98 mg/dL (ref 0.57–1.00)
Globulin, Total: 3.2 g/dL (ref 1.5–4.5)
Glucose: 235 mg/dL — ABNORMAL HIGH (ref 70–99)
Potassium: 3.8 mmol/L (ref 3.5–5.2)
Sodium: 138 mmol/L (ref 134–144)
Total Protein: 7.5 g/dL (ref 6.0–8.5)
eGFR: 62 mL/min/{1.73_m2} (ref >59)

## 2021-10-04 LAB — T4, FREE: Free T4: 1.65 ng/dL (ref 0.82–1.77)

## 2021-10-04 LAB — TSH: TSH: 0.133 u[IU]/mL — ABNORMAL LOW (ref 0.450–4.500)

## 2021-10-06 ENCOUNTER — Ambulatory Visit (HOSPITAL_BASED_OUTPATIENT_CLINIC_OR_DEPARTMENT_OTHER): Payer: Medicare Other | Admitting: Licensed Clinical Social Worker

## 2021-10-06 ENCOUNTER — Encounter (HOSPITAL_COMMUNITY): Payer: Self-pay | Admitting: Licensed Clinical Social Worker

## 2021-10-06 ENCOUNTER — Telehealth (HOSPITAL_COMMUNITY): Payer: Medicare Other | Admitting: Licensed Clinical Social Worker

## 2021-10-06 DIAGNOSIS — C50411 Malignant neoplasm of upper-outer quadrant of right female breast: Secondary | ICD-10-CM | POA: Diagnosis not present

## 2021-10-06 DIAGNOSIS — Z808 Family history of malignant neoplasm of other organs or systems: Secondary | ICD-10-CM | POA: Diagnosis not present

## 2021-10-06 DIAGNOSIS — Z803 Family history of malignant neoplasm of breast: Secondary | ICD-10-CM

## 2021-10-06 DIAGNOSIS — Z800 Family history of malignant neoplasm of digestive organs: Secondary | ICD-10-CM | POA: Diagnosis not present

## 2021-10-06 DIAGNOSIS — Z8042 Family history of malignant neoplasm of prostate: Secondary | ICD-10-CM

## 2021-10-06 DIAGNOSIS — Z170 Estrogen receptor positive status [ER+]: Secondary | ICD-10-CM | POA: Diagnosis not present

## 2021-10-06 NOTE — Progress Notes (Signed)
REFERRING PROVIDER: Derek Jack, MD [place],  [place] [address]  PRIMARY PROVIDER:  Coral Spikes, DO  PRIMARY REASON FOR VISIT:  1. Malignant neoplasm of upper-outer quadrant of right breast in female, estrogen receptor positive ([place])   2. Family history of breast cancer   3. Family history of prostate cancer   4. Family history of colon cancer   5. Family history of skin cancer     I connected with Ms. Weissman on 10/06/2021 at 1:45 PM EDT by MyChart video conference and verified that I am speaking with the correct person using two identifiers.    Patient location: home Provider location: [place]:   Ms. Othman, a 71 y.o. female, was seen for a [facility] cancer genetics consultation at the request of Dr. Delton Coombes due to a personal and family history of cancer.  Ms. Habermann presents to clinic today to discuss the possibility of a hereditary predisposition to cancer, genetic testing, and to further clarify her future cancer risks, as well as potential cancer risks for family members.   In 2022, at the age of 72, Ms. Guardiola was diagnosed with invasive ductal carcinoma of the right breast, ER/PR+, HER2-. The treatment plan included mastectomy completed on 07/29/2021 and anastrozole.    CANCER HISTORY:  Oncology History   No history exists.     RISK FACTORS:  Menarche was at age 32.  First live birth at age 37.  OCP use for approximately 0 years.  Ovaries intact: yes.  Hysterectomy: yes.  Menopausal status: postmenopausal.  HRT use: 0 years. Colonoscopy: yes; normal.  Past Medical History:  Diagnosis Date   Anxiety    Arthritis    Depression    Diabetes mellitus    Family history of breast cancer    Family history of colon cancer    Family history of prostate cancer    Family history of skin cancer    GERD (gastroesophageal reflux disease)    High blood pressure    Hypothyroid    Obesity    PONV  (postoperative nausea and vomiting)     Past Surgical History:  Procedure Laterality Date   ABDOMINAL HYSTERECTOMY     Bunions Right    CARPAL TUNNEL RELEASE Right    KNEE ARTHROSCOPY Right    MASTECTOMY MODIFIED RADICAL Right 07/29/2021   Procedure: MASTECTOMY MODIFIED RADICAL;  Surgeon: Aviva Signs, MD;  Location: [facility];  Service: General;  Laterality: Right;   THYROID SURGERY     removal   TOTAL KNEE ARTHROPLASTY Right 08/25/2013   Procedure: TOTAL KNEE ARTHROPLASTY;  Surgeon: Carole Civil, MD;  Location: [facility];  Service: Orthopedics;  Laterality: Right;   TOTAL KNEE ARTHROPLASTY Left 09/09/2014   Procedure: LEFT TOTAL KNEE ARTHROPLASTY;  Surgeon: Carole Civil, MD;  Location: [facility];  Service: Orthopedics;  Laterality: Left;    Social History   Socioeconomic History   Marital status: Married    Spouse name: Not on file   Number of children: 5   Years of education: Not on file   Highest education level: Not on file  Occupational History   Occupation: CNA    Employer: [employer]  Tobacco Use   Smoking status: Never   Smokeless tobacco: Never  Vaping Use   Vaping Use: Never used  Substance and Sexual Activity   Alcohol use: No   Drug use: No   Sexual activity: Yes    Birth  control/protection: Surgical  Other Topics Concern   Not on file  Social History Narrative   Not on file   Social Determinants of Health   Financial Resource Strain: Not on file  Food Insecurity: Not on file  Transportation Needs: Not on file  Physical Activity: Not on file  Stress: Not on file  Social Connections: Not on file     FAMILY HISTORY:  We obtained a detailed, 4-generation family history.  Significant diagnoses are listed below: Family History  Problem Relation Age of Onset   Diabetes Mother    Hypertension Mother    Congestive Heart Failure Father    Hypertension Father    Skin cancer Father 11       squamous cell on lip; metastatic   Hypertension Brother     Skin cancer Brother        squamous cell   Breast cancer Paternal Aunt        dx 56s d. 22s   Prostate cancer Paternal Uncle        dx 46s   Colon cancer Maternal Grandmother        dx 75s d. 54s   Diabetes Other    Cancer Cousin        unk type; possibly pancreatic   Ms. Cropley has 3 sons (33, 35, 3) and 1 daughter (32). She has 1 brother (73) who has had squamous cell skin cancer. She has 2 nephews and 1 niece.   Ms. Blattner mother died at 69. Patient had 1 maternal half uncle and 1 maternal half aunt, neither had cancer. One of her half aunt's sons had cancer, possibly pancreatic, and died in his 5s. Maternal grandmother died of colon cancer in her 20s. Grandfather died at 5.   Ms.Yoshimura's father died of metastatic squamous cell on his lip at 40. Patient had 5 paternal aunts, 3 paternal uncles. One aunt had breast cancer in her 64s and died in her 34s. An uncle had prostate cancer in his 32s and is living at 51. No known cancers in cousins or in paternal grandparents.  Ms. Brigandi is unaware of previous family history of genetic testing for hereditary cancer risks. Patient's maternal ancestors are of [place] descent, and paternal ancestors are of [place] descent. There is no reported Ashkenazi Jewish ancestry. There is no known consanguinity.    GENETIC COUNSELING ASSESSMENT: Ms. Steinmiller is a 71 y.o. female with a personal and family history which is somewhat suggestive of a hereditary cancer syndrome and predisposition to cancer. We, therefore, discussed and recommended the following at today's visit.   DISCUSSION: We discussed that approximately 10% of breast/prostate cancer is hereditary. Most cases of hereditary breast and prostate cancer are associated with BRCA1/BRCA2 genes, although there are other genes associated with hereditary cancer as well. Cancers and risks are gene specific.  We discussed that testing is beneficial for several reasons including knowing about other  cancer risks, identifying potential screening and risk-reduction options that may be appropriate, and to understand if other family members could be at risk for cancer and allow them to undergo genetic testing.   We reviewed the characteristics, features and inheritance patterns of hereditary cancer syndromes. We also discussed genetic testing, including the appropriate family members to test, the process of testing, insurance coverage and turn-around-time for results. We discussed the implications of a negative, positive and/or variant of uncertain significant result. We recommended Ms. Prowell pursue genetic testing for the [facility] Multi-Cancer+RNA gene panel.   The Multi-Cancer  Panel + RNA offered by [facility] includes sequencing and/or deletion duplication testing of the following 84 genes: AIP, ALK, APC, ATM, AXIN2,BAP1,  BARD1, BLM, BMPR1A, BRCA1, BRCA2, BRIP1, CASR, CDC73, CDH1, CDK4, CDKN1B, CDKN1C, CDKN2A (p14ARF), CDKN2A (p16INK4a), CEBPA, CHEK2, CTNNA1, DICER1, DIS3L2, EGFR (c.2369C>T, p.Thr790Met variant only), EPCAM (Deletion/duplication testing only), FH, FLCN, GATA2, GPC3, GREM1 (Promoter region deletion/duplication testing only), HOXB13 (c.251G>A, p.Gly84Glu), HRAS, KIT, MAX, MEN1, MET, MITF (c.952G>A, p.Glu318Lys variant only), MLH1, MSH2, MSH3, MSH6, MUTYH, NBN, NF1, NF2, NTHL1, PALB2, PDGFRA, PHOX2B, PMS2, POLD1, POLE, POT1, PRKAR1A, PTCH1, PTEN, RAD50, RAD51C, RAD51D, RB1, RECQL4, RET, RUNX1, SDHAF2, SDHA (sequence changes only), SDHB, SDHC, SDHD, SMAD4, SMARCA4, SMARCB1, SMARCE1, STK11, SUFU, TERC, TERT, TMEM127, TP53, TSC1, TSC2, VHL, WRN and WT1.  Based on Ms. Lennon personal and family history of cancer, she meets medical criteria for genetic testing. Despite that she meets criteria, she may still have an out of pocket cost. We discussed that if her out of pocket cost for testing is over $100, the laboratory will call and confirm whether she wants to proceed with testing.  If the out  of pocket cost of testing is less than $100 she will be billed by the genetic testing laboratory.   PLAN: After considering the risks, benefits, and limitations, Ms. Name provided informed consent to pursue genetic testing and the blood sample was sent to [facility] for analysis of the Multi-Cancer+RNA panel. Results should be available within approximately 2-3 weeks' time, at which point they will be disclosed by telephone to Ms. Gulick, as will any additional recommendations warranted by these results. Ms. Mangal will receive a summary of her genetic counseling visit and a copy of her results once available. This information will also be available in Epic.   Ms. Oliphant questions were answered to her satisfaction today. Our contact information was provided should additional questions or concerns arise. Thank you for the referral and allowing [place] to share in the care of your patient.   Faith Rogue, MS, [facility] Genetic Counselor [email or web address] Phone: [phone]  The patient was seen for a total of 30 minutes in virtual genetic counseling.  Patient was seen alone. Dr. Grayland Ormond was available for discussion regarding this case.   _______________________________________________________________________ For Office Staff:  Number of people involved in session: 1 Was an Intern/ student involved with case: no

## 2021-10-24 ENCOUNTER — Other Ambulatory Visit: Payer: Self-pay | Admitting: Urology

## 2021-10-24 DIAGNOSIS — R32 Unspecified urinary incontinence: Secondary | ICD-10-CM

## 2021-10-25 ENCOUNTER — Telehealth: Payer: Self-pay | Admitting: Licensed Clinical Social Worker

## 2021-10-25 ENCOUNTER — Encounter: Payer: Self-pay | Admitting: Licensed Clinical Social Worker

## 2021-10-25 ENCOUNTER — Ambulatory Visit: Payer: Self-pay | Admitting: Licensed Clinical Social Worker

## 2021-10-25 DIAGNOSIS — Z1379 Encounter for other screening for genetic and chromosomal anomalies: Secondary | ICD-10-CM

## 2021-10-25 NOTE — Progress Notes (Signed)
HPI:  Ms. Panning was previously seen in the [facility] clinic due to a personal and family history of cancer and concerns regarding a hereditary predisposition to cancer. Please refer to our prior cancer genetics clinic note for more information regarding our discussion, assessment and recommendations, at the time. Ms. Juncaj recent genetic test results were disclosed to her, as were recommendations warranted by these results. These results and recommendations are discussed in more detail below.  CANCER HISTORY:  Oncology History  Breast cancer of upper-outer quadrant of right female breast ([place])  08/31/2021 Initial Diagnosis   Breast cancer of upper-outer quadrant of right female breast [facility])    Genetic Testing   Negative genetic testing. No pathogenic variants identified on the [facility] Multi-Cancer+RNA panel. VUS in [place] called c.1022G>A identified. The report date is 10/24/2021.  The Multi-Cancer Panel + RNA offered by [facility] includes sequencing and/or deletion duplication testing of the following 84 genes: AIP, ALK, APC, ATM, AXIN2,BAP1,  BARD1, BLM, BMPR1A, BRCA1, BRCA2, BRIP1, CASR, CDC73, CDH1, CDK4, CDKN1B, CDKN1C, CDKN2A (p14ARF), CDKN2A (p16INK4a), CEBPA, CHEK2, CTNNA1, DICER1, DIS3L2, EGFR (c.2369C>T, p.Thr790Met variant only), EPCAM (Deletion/duplication testing only), FH, FLCN, GATA2, GPC3, GREM1 (Promoter region deletion/duplication testing only), HOXB13 (c.251G>A, p.Gly84Glu), HRAS, KIT, MAX, MEN1, MET, MITF (c.952G>A, p.Glu318Lys variant only), MLH1, MSH2, MSH3, MSH6, MUTYH, NBN, NF1, NF2, NTHL1, PALB2, PDGFRA, PHOX2B, PMS2, POLD1, POLE, POT1, PRKAR1A, PTCH1, PTEN, RAD50, RAD51C, RAD51D, RB1, RECQL4, RET, RUNX1, SDHAF2, SDHA (sequence changes only), SDHB, SDHC, SDHD, SMAD4, SMARCA4, SMARCB1, SMARCE1, STK11, SUFU, TERC, TERT, TMEM127, TP53, TSC1, TSC2, VHL, WRN and WT1.     FAMILY HISTORY:  We obtained a detailed, 4-generation family history.  Significant diagnoses  are listed below: Family History  Problem Relation Age of Onset   Diabetes Mother    Hypertension Mother    Congestive Heart Failure Father    Hypertension Father    Skin cancer Father 19       squamous cell on lip; metastatic   Hypertension Brother    Skin cancer Brother        squamous cell   Breast cancer Paternal Aunt        dx 52s d. 23s   Prostate cancer Paternal Uncle        dx 22s   Colon cancer Maternal Grandmother        dx 10s d. 44s   Diabetes Other    Cancer Cousin        unk type; possibly pancreatic   Ms. Llanas has 3 sons (11, 33, 34) and 1 daughter (71). She has 1 brother (73) who has had squamous cell skin cancer. She has 2 nephews and 1 niece.    Ms. Qin mother died at 37. Patient had 1 maternal half uncle and 1 maternal half aunt, neither had cancer. One of her half aunt's sons had cancer, possibly pancreatic, and died in his 35s. Maternal grandmother died of colon cancer in her 103s. Grandfather died at 51.    Ms.Mcgregory's father died of metastatic squamous cell on his lip at 31. Patient had 5 paternal aunts, 3 paternal uncles. One aunt had breast cancer in her 64s and died in her 65s. An uncle had prostate cancer in his 76s and is living at 51. No known cancers in cousins or in paternal grandparents.   Ms. Symons is unaware of previous family history of genetic testing for hereditary cancer risks. Patient's maternal ancestors are of [place] descent, and paternal ancestors are of [place] descent.  There is no reported Ashkenazi Jewish ancestry. There is no known consanguinity.      GENETIC TEST RESULTS: Genetic testing reported out on 10/24/2021 through the [facility] Multi-Cancer+RNA cancer panel found no pathogenic mutations.   The Multi-Cancer Panel + RNA offered by [facility] includes sequencing and/or deletion duplication testing of the following 84 genes: AIP, ALK, APC, ATM, AXIN2,BAP1,  BARD1, BLM, BMPR1A, BRCA1, BRCA2, BRIP1, CASR, CDC73, CDH1, CDK4,  CDKN1B, CDKN1C, CDKN2A (p14ARF), CDKN2A (p16INK4a), CEBPA, CHEK2, CTNNA1, DICER1, DIS3L2, EGFR (c.2369C>T, p.Thr790Met variant only), EPCAM (Deletion/duplication testing only), FH, FLCN, GATA2, GPC3, GREM1 (Promoter region deletion/duplication testing only), HOXB13 (c.251G>A, p.Gly84Glu), HRAS, KIT, MAX, MEN1, MET, MITF (c.952G>A, p.Glu318Lys variant only), MLH1, MSH2, MSH3, MSH6, MUTYH, NBN, NF1, NF2, NTHL1, PALB2, PDGFRA, PHOX2B, PMS2, POLD1, POLE, POT1, PRKAR1A, PTCH1, PTEN, RAD50, RAD51C, RAD51D, RB1, RECQL4, RET, RUNX1, SDHAF2, SDHA (sequence changes only), SDHB, SDHC, SDHD, SMAD4, SMARCA4, SMARCB1, SMARCE1, STK11, SUFU, TERC, TERT, TMEM127, TP53, TSC1, TSC2, VHL, WRN and WT1.   The test report has been scanned into EPIC and is located under the Molecular Pathology section of the Results Review tab.  A portion of the result report is included below for reference.     We discussed that because current genetic testing is not perfect, it is possible there may be a gene mutation in one of these genes that current testing cannot detect, but that chance is small.  There could be another gene that has not yet been discovered, or that we have not yet tested, that is responsible for the cancer diagnoses in the family. It is also possible there is a hereditary cause for the cancer in the family that Ms. Plummer did not inherit and therefore was not identified in her testing.  Therefore, it is important to remain in touch with cancer genetics in the future so that we can continue to offer Ms. Loney the most up to date genetic testing.   Genetic testing did identify a variant of uncertain significance (VUS) in the FLCN gene called c.1022G>A.  At this time, it is unknown if this variant is associated with increased cancer risk or if this is a normal finding, but most variants such as this get reclassified to being inconsequential. It should not be used to make medical management decisions. With time, we suspect  the lab will determine the significance of this variant, if any. If we do learn more about it we will try to contact Ms. Hosek to discuss it further. However, it is important to stay in touch with [place] periodically and keep the address and phone number up to date.  ADDITIONAL GENETIC TESTING: We discussed with Ms. Bert that her genetic testing was fairly extensive.  If there are genes identified to increase cancer risk that can be analyzed in the future, we would be happy to discuss and coordinate this testing at that time.    CANCER SCREENING RECOMMENDATIONS: Ms. Winders test result is considered negative (normal).  This means that we have not identified a hereditary cause for her  personal and family history of cancer at this time. Most cancers happen by chance and this negative test suggests that her cancer may fall into this category.    While reassuring, this does not definitively rule out a hereditary predisposition to cancer. It is still possible that there could be genetic mutations that are undetectable by current technology. There could be genetic mutations in genes that have not been tested or identified to increase cancer risk.  Therefore, it  is recommended she continue to follow the cancer management and screening guidelines provided by her oncology and primary healthcare provider.   An individual's cancer risk and medical management are not determined by genetic test results alone. Overall cancer risk assessment incorporates additional factors, including personal medical history, family history, and any available genetic information that may result in a personalized plan for cancer prevention and surveillance.  RECOMMENDATIONS FOR FAMILY MEMBERS:  Relatives in this family might be at some increased risk of developing cancer, over the general population risk, simply due to the family history of cancer.  We recommended female relatives in this family have a yearly mammogram beginning at  age 40, or 19 years younger than the earliest onset of cancer, an annual clinical breast exam, and perform monthly breast self-exams. Female relatives in this family should also have a gynecological exam as recommended by their primary provider.  All family members should be referred for colonoscopy starting at age 40.   FOLLOW-UP: Lastly, we discussed with Ms. Greear that cancer genetics is a rapidly advancing field and it is possible that new genetic tests will be appropriate for her and/or her family members in the future. We encouraged her to remain in contact with cancer genetics on an annual basis so we can update her personal and family histories and let her know of advances in cancer genetics that may benefit this family.   Our contact number was provided. Ms. Kemler questions were answered to her satisfaction, and she knows she is welcome to call us at anytime with additional questions or concerns.   Faith Rogue, MS, [facility] Genetic Counselor [email or web address] Phone: [phone]

## 2021-10-25 NOTE — Telephone Encounter (Signed)
Revealed negative genetic testing.  Revealed that a VUS in FLCN was identified. This normal result is reassuring and indicates that it is unlikely Kimberly [name] cancer is due to a hereditary cause.  It is unlikely that there is an increased risk of another cancer due to a mutation in one of these genes.  However, genetic testing is not perfect, and cannot definitively rule out a hereditary cause.  It will be important for her to keep in contact with genetics to learn if any additional testing may be needed in the future.

## 2021-10-31 ENCOUNTER — Ambulatory Visit (INDEPENDENT_AMBULATORY_CARE_PROVIDER_SITE_OTHER): Payer: Medicare Other | Admitting: Nurse Practitioner

## 2021-10-31 ENCOUNTER — Encounter: Payer: Self-pay | Admitting: Nurse Practitioner

## 2021-10-31 VITALS — BP 117/68 | HR 85 | Ht 61.81 in | Wt 191.0 lb

## 2021-10-31 DIAGNOSIS — E1121 Type 2 diabetes mellitus with diabetic nephropathy: Secondary | ICD-10-CM

## 2021-10-31 DIAGNOSIS — I10 Essential (primary) hypertension: Secondary | ICD-10-CM

## 2021-10-31 DIAGNOSIS — E039 Hypothyroidism, unspecified: Secondary | ICD-10-CM

## 2021-10-31 DIAGNOSIS — E782 Mixed hyperlipidemia: Secondary | ICD-10-CM

## 2021-10-31 LAB — POCT GLYCOSYLATED HEMOGLOBIN (HGB A1C): HbA1c, POC (controlled diabetic range): 8.5 % — AB (ref 0.0–7.0)

## 2021-10-31 MED ORDER — METFORMIN HCL ER 500 MG PO TB24: 1000.0000 mg | ORAL_TABLET | Freq: Every day | ORAL | 3 refills | Status: DC

## 2021-10-31 MED ORDER — TRESIBA FLEXTOUCH 100 UNIT/ML ~~LOC~~ SOPN: 25.0000 [IU] | PEN_INJECTOR | Freq: Every day | SUBCUTANEOUS | 3 refills | Status: DC

## 2021-10-31 NOTE — Progress Notes (Signed)
Endocrinology Follow Up Note       10/31/2021, 2:01 PM   Subjective:    Patient ID: Kimberly [name], female    DOB: 11/02/1950.  Kimberly [name] is being seen in follow up after being seen in consultation for management of currently uncontrolled symptomatic diabetes requested by  Coral Spikes, DO.   Past Medical History:  Diagnosis Date   Anxiety    Arthritis    Depression    Diabetes mellitus    Family history of breast cancer    Family history of colon cancer    Family history of prostate cancer    Family history of skin cancer    GERD (gastroesophageal reflux disease)    High blood pressure    Hypothyroid    Obesity    PONV (postoperative nausea and vomiting)     Past Surgical History:  Procedure Laterality Date   ABDOMINAL HYSTERECTOMY     [place]un[place] Right    CARPAL TUNNEL RELEASE Right    KNEE ARTHROSCOPY Right    MASTECTOMY MODIFIED RADICAL Right 07/29/2021   Procedure: MASTECTOMY MODIFIED RADICAL;  Surgeon: Aviva Signs, MD;  Location: [facility];  Service: General;  Laterality: Right;   THYROID SURGERY     removal   TOTAL KNEE ARTHROPLASTY Right 08/25/2013   Procedure: TOTAL KNEE ARTHROPLASTY;  Surgeon: Carole Civil, MD;  Location: [facility];  Service: Orthopedics;  Laterality: Right;   TOTAL KNEE ARTHROPLASTY Left 09/09/2014   Procedure: LEFT TOTAL KNEE ARTHROPLASTY;  Surgeon: Carole Civil, MD;  Location: [facility];  Service: Orthopedics;  Laterality: Left;    Social History   Socioeconomic History   Marital status: Married    Spouse name: Not on file   Number of children: 5   Years of education: Not on file   Highest education level: Not on file  Occupational History   Occupation: CNA    Employer: [employer]  Tobacco Use   Smoking status: Never   Smokeless tobacco: Never  Vaping Use   Vaping Use: Never used  Substance and Sexual Activity   Alcohol use: No   Drug use: No    Sexual activity: Yes    Birth control/protection: Surgical  Other Topics Concern   Not on file  Social History Narrative   Not on file   Social Determinants of Health   Financial Resource Strain: Not on file  Food Insecurity: Not on file  Transportation Needs: Not on file  Physical Activity: Not on file  Stress: Not on file  Social Connections: Not on file    Family History  Problem Relation Age of Onset   Diabetes Mother    Hypertension Mother    Congestive Heart Failure Father    Hypertension Father    Skin cancer Father 94       squamous cell on lip; metastatic   Hypertension Brother    Skin cancer Brother        squamous cell   Breast cancer Paternal Aunt        dx 59s d. 21s   Prostate cancer Paternal Uncle        dx 53s   Colon cancer  Maternal Grandmother        dx 12s d. 95s   Diabetes Other    Cancer Cousin        unk type; possibly pancreatic    Outpatient Encounter Medications as of 10/31/2021  Medication Sig   anastrozole (ARIMIDEX) 1 MG tablet Take 1 tablet (1 mg total) by mouth daily.   Calcium-Vitamin D 500-3.125 MG-MCG TABS Take by mouth. Takes 2 daily   diclofenac Sodium (VOLTAREN) 1 % GEL Apply 2 g topically daily as needed (knee pain).   famotidine (PEPCID) 40 MG tablet Take 1 tablet (40 mg total) by mouth daily.   gabapentin (NEURONTIN) 100 MG capsule Take 1 capsule (100 mg total) by mouth 3 (three) times daily.   glipiZIDE (GLUCOTROL XL) 5 MG 24 hr tablet Take 1 tablet (5 mg total) by mouth daily with breakfast.   hydrochlorothiazide (HYDRODIURIL) 25 MG tablet Take 1 tablet (25 mg total) by mouth daily.   Insulin Pen Needle (B-D ULTRAFINE III SHORT PEN) 31G X 8 MM MISC 1 each by Does not apply route as directed. Use to inject insulin once daily   levothyroxine (SYNTHROID) 125 MCG tablet TAKE 1 TABLET ONCE DAILY   BEFORE BREAKFAST   losartan (COZAAR) 100 MG tablet Take 1 tablet (100 mg total) by mouth every morning.   metoprolol succinate  (TOPROL-XL) 100 MG 24 hr tablet TAKE 1 TABLET AT BEDTIME   WITH OR IMMEDIATELY        FOLLOWING A MEAL   omeprazole (PRILOSEC) 20 MG capsule Take one capsule po qhs   tolterodine (DETROL LA) 4 MG 24 hr capsule Take 1 capsule by mouth once daily   venlafaxine XR (EFFEXOR-XR) 75 MG 24 hr capsule TAKE 1 CAPSULE DAILY WITH  BREAKFAST   Vitamin D, Ergocalciferol, (DRISDOL) 1.25 MG (50000 UNIT) CAPS capsule Take 1 capsule (50,000 Units total) by mouth every 7 (seven) days.   [DISCONTINUED] insulin degludec (TRESIBA FLEXTOUCH) 100 UNIT/ML FlexTouch Pen Inject 18 Units into the skin at bedtime.   [DISCONTINUED] metFORMIN (GLUCOPHAGE-XR) 500 MG 24 hr tablet Take 1,000 mg by mouth daily with breakfast.   insulin degludec (TRESIBA FLEXTOUCH) 100 UNIT/ML FlexTouch Pen Inject 25 Units into the skin at bedtime.   metFORMIN (GLUCOPHAGE-XR) 500 MG 24 hr tablet Take 2 tablets (1,000 mg total) by mouth daily with breakfast.   Facility-Administered Encounter Medications as of 10/31/2021  Medication   bupivacaine liposome (EXPAREL) 1.3 % injection 266 mg    ALLERGIES: Allergies  Allergen Reactions   Ace Inhibitors Cough    VACCINATION STATUS: Immunization History  Administered Date(s) Administered   Fluad Quad(high Dose 65+) 07/12/2020   Influenza,inj,Quad PF,6+ Mos 04/30/2018   Influenza-Unspecified 05/21/2016, 05/03/2017, 06/21/2019   Moderna Sars-Covid-2 Vaccination 10/07/2019, 11/05/2019, 08/02/2020   Pneumococcal Conjugate-13 03/03/2014, 05/30/2017   Pneumococcal Polysaccharide-23 05/27/2010    Diabetes She presents for her follow-up diabetic visit. She has type 2 diabetes mellitus. Onset time: Diagnosed at approx age of 37. Her disease course has been improving. There are no hypoglycemic associated symptoms. Pertinent negatives for hypoglycemia include no nervousness/anxiousness or tremors. Associated symptoms include foot paresthesias. Pertinent negatives for diabetes include no fatigue, no  polydipsia, no polyuria and no weight loss. There are no hypoglycemic complications. Symptoms are stable. Diabetic complications include nephropathy and peripheral neuropathy. Risk factors for coronary artery disease include diabetes mellitus, dyslipidemia, family history, obesity, post-menopausal and hypertension. Current diabetic treatment includes oral agent (dual therapy) and insulin injections. She is compliant with treatment most of  the time. Her weight is fluctuating minimally. She is following a generally healthy diet. Meal planning includes avoidance of concentrated sweets. She has had a previous visit with a dietitian. She participates in exercise three times a week. Her home blood glucose trend is decreasing steadily. Her breakfast blood glucose range is generally 140-180 mg/dl. Her bedtime blood glucose range is generally 180-200 mg/dl. (She presents today with her meter and logs showing improved, yet still above target glycemic profile overall.  Her POCT A1c today is 8.5%, improving slightly from last visit of 8.6%.  She has tolerated the addition of basal insulin well.  She denies any hypoglycemia.) An ACE inhibitor/angiotensin II receptor blocker is being taken. She sees a podiatrist (has seen one in the distant past).Eye exam is current.  Hyperlipidemia This is a chronic problem. The current episode started more than 1 year ago. The problem is uncontrolled. Recent lipid tests were reviewed and are variable. Exacerbating diseases include chronic renal disease, diabetes, hypothyroidism and obesity. Factors aggravating her hyperlipidemia include thiazides and beta blockers. Current antihyperlipidemic treatment includes diet change and exercise. The current treatment provides mild improvement of lipids. There are no compliance problems.  Risk factors for coronary artery disease include diabetes mellitus, dyslipidemia, family history, hypertension, obesity and post-menopausal.  Hypertension This is a  chronic problem. The current episode started more than 1 year ago. The problem has been resolved since onset. The problem is controlled. Pertinent negatives include no palpitations. Agents associated with hypertension include thyroid hormones. Risk factors for coronary artery disease include diabetes mellitus, dyslipidemia, family history, obesity and post-menopausal state. Past treatments include angiotensin blockers, beta blockers and diuretics. The current treatment provides moderate improvement. There are no compliance problems.  Hypertensive end-organ damage includes kidney disease. Identifiable causes of hypertension include chronic renal disease and a thyroid problem.  Thyroid Problem Presents for follow-up visit. Patient reports no anxiety, cold intolerance, constipation, depressed mood, fatigue, heat intolerance, leg swelling, palpitations, tremors, weight gain or weight loss. The symptoms have been stable. Past treatments include levothyroxine. The treatment provided significant relief. Her past medical history is significant for diabetes, hyperlipidemia and neuropathy (? vs chronic back issues).    Review of systems  Constitutional: + Minimally fluctuating body weight,  current Body mass index is 35.15 kg/m. , no fatigue, no subjective hyperthermia, no subjective hypothermia Eyes: no blurry vision, no xerophthalmia ENT: no sore throat, no nodules palpated in throat, no dysphagia/odynophagia, no hoarseness Cardiovascular: no chest pain, no shortness of breath, no palpitations, no leg swelling Respiratory: no cough, no shortness of breath Gastrointestinal: no nausea/vomiting/diarrhea Musculoskeletal: no muscle/joint aches Skin: no rashes, no hyperemia Neurological: no tremors, no numbness, no tingling, no dizziness Psychiatric: no depression, no anxiety  Objective:     BP 117/68    Pulse 85    Ht 5' 1.81" (1.57 m)    Wt 191 lb (86.6 kg)    SpO2 98%    BMI 35.15 kg/m   Wt Readings  from Last 3 Encounters:  10/31/21 191 lb (86.6 kg)  10/03/21 193 lb (87.5 kg)  09/19/21 194 lb (88 kg)     BP Readings from Last 3 Encounters:  10/31/21 117/68  10/03/21 122/70  09/19/21 (!) 145/77     Physical Exam- Limited  Constitutional:  Body mass index is 35.15 kg/m. , not in acute distress, normal state of mind Eyes:  EOMI, no exophthalmos Neck: Supple Cardiovascular: RRR, no murmurs, rubs, or gallops, no edema Respiratory: Adequate breathing efforts, no crackles,  rales, rhonchi, or wheezing Musculoskeletal: no gross deformities, strength intact in all four extremities, no gross restriction of joint movements Skin:  no rashes, no hyperemia Neurological: no tremor with outstretched hands   Diabetic Foot Exam - Simple   No data filed      CMP ( most recent) CMP     Component Value Date/Time   NA 138 10/03/2021 1419   K 3.8 10/03/2021 1419   CL 100 10/03/2021 1419   CO2 23 10/03/2021 1419   GLUCOSE 235 (H) 10/03/2021 1419   GLUCOSE 192 (H) 07/30/2021 0354   BUN 20 10/03/2021 1419   CREATININE 0.98 10/03/2021 1419   CREATININE 1.06 08/11/2014 0701   CALCIUM 10.1 10/03/2021 1419   PROT 7.5 10/03/2021 1419   ALBUMIN 4.3 10/03/2021 1419   AST 25 10/03/2021 1419   ALT 30 10/03/2021 1419   ALKPHOS 96 10/03/2021 1419   BILITOT 0.3 10/03/2021 1419   GFRNONAA 43 (L) 07/30/2021 0354   GFRAA 65 10/11/2020 1146     Diabetic Labs (most recent): Lab Results  Component Value Date   HGBA1C 8.5 (A) 10/31/2021   HGBA1C 8.6 (H) 07/27/2021   HGBA1C 7.6 (H) 04/11/2021     Lipid Panel ( most recent) Lipid Panel     Component Value Date/Time   CHOL 163 04/11/2021 1042   TRIG 253 (H) 04/11/2021 1042   HDL 42 04/11/2021 1042   CHOLHDL 3.9 04/11/2021 1042   CHOLHDL 3.7 06/03/2014 0709   VLDL 40 06/03/2014 0709   LDLCALC 79 04/11/2021 1042   LABVLDL 42 (H) 04/11/2021 1042      Lab Results  Component Value Date   TSH 0.133 (L) 10/03/2021   TSH 0.653  04/11/2021   TSH 1.490 04/19/2020   TSH 2.470 07/31/2019   TSH 0.528 04/22/2018   TSH 0.256 (L) 12/26/2017   TSH 1.030 07/08/2015   TSH 3.790 11/11/2014   TSH 0.177 (L) 06/03/2014   TSH 1.697 07/09/2013   FREET4 1.65 10/03/2021           Assessment & Plan:   1) Type 2 diabetes mellitus with diabetic nephropathy, without long-term current use of insulin ([place])  She presents today with her meter and logs showing improved, yet still above target glycemic profile overall.  Her POCT A1c today is 8.5%, improving slightly from last visit of 8.6%.  She has tolerated the addition of basal insulin well.  She denies any hypoglycemia.  - Kimberly [name] has currently uncontrolled symptomatic type 2 DM since 72 years of age.  -Recent labs reviewed.  - I had a long discussion with her about the progressive nature of diabetes and the pathology behind its complications. -her diabetes is complicated by mild CKD, peripheral neuropathy and she remains at a high risk for more acute and chronic complications which include CAD, CVA, CKD, retinopathy, and neuropathy. These are all discussed in detail with her.  - Nutritional counseling repeated at each appointment due to patients tendency to fall back in to old habits.  - The patient admits there is a room for improvement in their diet and drink choices. -  Suggestion is made for the patient to avoid simple carbohydrates from their diet including Cakes, Sweet Desserts / Pastries, Ice Cream, Soda (diet and regular), Sweet Tea, Candies, Chips, Cookies, Sweet Pastries, Store Bought Juices, Alcohol in Excess of 1-2 drinks a day, Artificial Sweeteners, Coffee Creamer, and "Sugar-free" Products. This will help patient to have stable blood glucose profile and potentially avoid  unintended weight gain.   - I encouraged the patient to switch to unprocessed or minimally processed complex starch and increased protein intake (animal or plant source), fruits, and  vegetables.   - Patient is advised to stick to a routine mealtimes to eat 3 meals a day and avoid unnecessary snacks (to snack only to correct hypoglycemia).  - she declines referral to Jearld Fenton, RDN, CDE for diabetes education at this time.  - I have approached her with the following individualized plan to manage her diabetes and patient agrees:   -She will tolerate slight increase in her Tresiba to 25 units SQ nightly.  She can continue her Metformin 1000 mg ER po daily with breakfast (tolerates much better than regular form), and Glipizide 5 mg XL daily with breakfast.  -she is encouraged to continue monitoring blood glucose twice daily, before breakfast and before bed, and to call the clinic if she has readings less than 70 or above 200 for 3 tests in a row.   - she will be considered for incretin therapy as appropriate next visit.  - Specific targets for  A1c; LDL, HDL, and Triglycerides were discussed with the patient.  2) Blood Pressure /Hypertension:  her blood pressure is controlled to target.   she is advised to continue her current medications including HCTZ 25 mg po daily, Losartan 100 mg p.o. daily with breakfast, and Metoprolol 100 mg po daily.  3) Lipids/Hyperlipidemia:    Review of her recent lipid panel from 04/11/21 showed controlled LDL at 79 and elevated triglycerides of 253.  She prefers to stay away from statins at this time.  She is advised to avoid fried foods and butter.  Will recheck lipid panel prior to next visit.  4)  Weight/Diet:  her Body mass index is 35.15 kg/m.  -  clearly complicating her diabetes care.   she is a candidate for weight loss. I discussed with her the fact that loss of 5 - 10% of her  current body weight will have the most impact on her diabetes management.  Exercise, and detailed carbohydrates information provided  -  detailed on discharge instructions.  5) Vitamin D Deficiency Her recent vitamin D level was 15.64 on 08/31/21- checked by  her oncologist.  She was initiated on Ergocalciferol 50000 units weekly.  6) Hypothyroidism-unspecified Her previsit thyroid function tests are consistent with appropriate hormone replacement.  She is advised to continue Levothyroxine 125 mcg po daily before breakfast.   Will recheck TFTs prior to next visit and adjust dose if needed.   - The correct intake of thyroid hormone (Levothyroxine, Synthroid), is on empty stomach first thing in the morning, with water, separated by at least 30 minutes from breakfast and other medications,  and separated by more than 4 hours from calcium, iron, multivitamins, acid reflux medications (PPIs).  - This medication is a life-long medication and will be needed to correct thyroid hormone imbalances for the rest of your life.  The dose may change from time to time, based on thyroid blood work.  - It is extremely important to be consistent taking this medication, near the same time each morning.  -AVOID TAKING PRODUCTS CONTAINING BIOTIN (commonly found in Hair, Skin, Nails vitamins) AS IT INTERFERES WITH THE VALIDITY OF THYROID FUNCTION BLOOD TESTS.  6) Chronic Care/Health Maintenance: -she is on ACEI/ARB medications and is encouraged to initiate and continue to follow up with Ophthalmology, Dentist, Podiatrist at least yearly or according to recommendations, and advised to stay  away from smoking. I have recommended yearly flu vaccine and pneumonia vaccine at least every 5 years; moderate intensity exercise for up to 150 minutes weekly; and sleep for at least 7 hours a day.  - she is advised to maintain close follow up with Coral Spikes, DO for primary care needs, as well as her other providers for optimal and coordinated care.      I spent 34 minutes in the care of the patient today including review of labs from [place], Lipids, Thyroid Function, Hematology (current and previous including abstractions from other facilities); face-to-face time discussing  her blood  glucose readings/logs, discussing hypoglycemia and hyperglycemia episodes and symptoms, medications doses, her options of short and long term treatment based on the latest standards of care / guidelines;  discussion about incorporating lifestyle medicine;  and documenting the encounter.    Please refer to Patient Instructions for Blood Glucose Monitoring and Insulin/Medications Dosing Guide"  in media tab for additional information. Please  also refer to " Patient Self Inventory" in the Media  tab for reviewed elements of pertinent patient history.  Kimberly [name] participated in the discussions, expressed understanding, and voiced agreement with the above plans.  All questions were answered to her satisfaction. she is encouraged to contact clinic should she have any questions or concerns prior to her return visit.     Follow up plan: - Return in about 3 months (around 01/31/2022) for Diabetes F/U with A1c in office, Previsit labs, Bring meter and logs.   Rayetta Pigg, [facility] [address] [place], [place] [address] Phone: [phone] Fax: [phone]  10/31/2021, 2:01 PM

## 2021-10-31 NOTE — Patient Instructions (Signed)

## 2021-11-23 ENCOUNTER — Telehealth: Payer: Self-pay | Admitting: Nurse Practitioner

## 2021-11-23 ENCOUNTER — Other Ambulatory Visit: Payer: Self-pay

## 2021-11-23 ENCOUNTER — Other Ambulatory Visit: Payer: Self-pay | Admitting: *Deleted

## 2021-11-23 MED ORDER — VENLAFAXINE HCL ER 75 MG PO CP24: ORAL_CAPSULE | ORAL | 1 refills | Status: DC

## 2021-11-23 MED ORDER — GABAPENTIN 100 MG PO CAPS
100.0000 mg | ORAL_CAPSULE | Freq: Three times a day (TID) | ORAL | 1 refills | Status: DC
Start: 2021-11-23 — End: 2022-11-25

## 2021-11-23 MED ORDER — TRESIBA FLEXTOUCH 100 UNIT/ML ~~LOC~~ SOPN: 25.0000 [IU] | PEN_INJECTOR | Freq: Every day | SUBCUTANEOUS | 3 refills | Status: DC

## 2021-11-23 NOTE — Telephone Encounter (Signed)
Patient notified

## 2021-11-23 NOTE — Telephone Encounter (Signed)
Patient said she is ready for her new insulin to be ordered. She said it was going to be [place]. [place] ?

## 2021-11-23 NOTE — Telephone Encounter (Signed)
Patient called and would like refills sent to [facility] in [place] for her Gabapentin  and Effexor - she stated it is cheaper to get the scripts filled there with good RX ?

## 2021-11-23 NOTE — Telephone Encounter (Signed)
Medication has been sent to the requested pharmacy. ?

## 2021-11-24 ENCOUNTER — Other Ambulatory Visit: Payer: Self-pay | Admitting: Family Medicine

## 2021-11-24 DIAGNOSIS — I10 Essential (primary) hypertension: Secondary | ICD-10-CM

## 2021-11-24 DIAGNOSIS — K219 Gastro-esophageal reflux disease without esophagitis: Secondary | ICD-10-CM

## 2021-11-30 ENCOUNTER — Ambulatory Visit: Payer: Medicare Other | Admitting: Nurse Practitioner

## 2021-12-29 ENCOUNTER — Other Ambulatory Visit: Payer: Self-pay | Admitting: Urology

## 2021-12-29 DIAGNOSIS — R32 Unspecified urinary incontinence: Secondary | ICD-10-CM

## 2022-01-03 ENCOUNTER — Other Ambulatory Visit: Payer: Self-pay

## 2022-01-03 DIAGNOSIS — R32 Unspecified urinary incontinence: Secondary | ICD-10-CM

## 2022-01-03 MED ORDER — TOLTERODINE TARTRATE ER 4 MG PO CP24: 4.0000 mg | ORAL_CAPSULE | Freq: Every day | ORAL | 0 refills | Status: DC

## 2022-01-09 ENCOUNTER — Inpatient Hospital Stay (HOSPITAL_COMMUNITY): Payer: Medicare Other | Attending: Hematology

## 2022-01-09 DIAGNOSIS — Z9011 Acquired absence of right breast and nipple: Secondary | ICD-10-CM | POA: Insufficient documentation

## 2022-01-09 DIAGNOSIS — Z170 Estrogen receptor positive status [ER+]: Secondary | ICD-10-CM | POA: Insufficient documentation

## 2022-01-09 DIAGNOSIS — Z90710 Acquired absence of both cervix and uterus: Secondary | ICD-10-CM | POA: Diagnosis not present

## 2022-01-09 DIAGNOSIS — M2550 Pain in unspecified joint: Secondary | ICD-10-CM | POA: Diagnosis not present

## 2022-01-09 DIAGNOSIS — I10 Essential (primary) hypertension: Secondary | ICD-10-CM | POA: Diagnosis not present

## 2022-01-09 DIAGNOSIS — R232 Flushing: Secondary | ICD-10-CM | POA: Insufficient documentation

## 2022-01-09 DIAGNOSIS — Z8673 Personal history of transient ischemic attack (TIA), and cerebral infarction without residual deficits: Secondary | ICD-10-CM | POA: Insufficient documentation

## 2022-01-09 DIAGNOSIS — Z840 Family history of diseases of the skin and subcutaneous tissue: Secondary | ICD-10-CM | POA: Diagnosis not present

## 2022-01-09 DIAGNOSIS — Z8249 Family history of ischemic heart disease and other diseases of the circulatory system: Secondary | ICD-10-CM | POA: Insufficient documentation

## 2022-01-09 DIAGNOSIS — C50411 Malignant neoplasm of upper-outer quadrant of right female breast: Secondary | ICD-10-CM | POA: Insufficient documentation

## 2022-01-09 DIAGNOSIS — Z800 Family history of malignant neoplasm of digestive organs: Secondary | ICD-10-CM | POA: Insufficient documentation

## 2022-01-09 DIAGNOSIS — E119 Type 2 diabetes mellitus without complications: Secondary | ICD-10-CM | POA: Diagnosis not present

## 2022-01-09 DIAGNOSIS — Z803 Family history of malignant neoplasm of breast: Secondary | ICD-10-CM | POA: Diagnosis not present

## 2022-01-09 DIAGNOSIS — I252 Old myocardial infarction: Secondary | ICD-10-CM | POA: Insufficient documentation

## 2022-01-09 DIAGNOSIS — E559 Vitamin D deficiency, unspecified: Secondary | ICD-10-CM

## 2022-01-09 DIAGNOSIS — Z79811 Long term (current) use of aromatase inhibitors: Secondary | ICD-10-CM | POA: Diagnosis not present

## 2022-01-09 LAB — CBC WITH DIFFERENTIAL/PLATELET
Abs Immature Granulocytes: 0.01 10*3/uL (ref 0.00–0.07)
Basophils Absolute: 0.1 10*3/uL (ref 0.0–0.1)
Basophils Relative: 1 %
Eosinophils Absolute: 0.3 10*3/uL (ref 0.0–0.5)
Eosinophils Relative: 4 %
HCT: 33.9 % — ABNORMAL LOW (ref 36.0–46.0)
Hemoglobin: 11.4 g/dL — ABNORMAL LOW (ref 12.0–15.0)
Immature Granulocytes: 0 %
Lymphocytes Relative: 26 %
Lymphs Abs: 2.1 10*3/uL (ref 0.7–4.0)
MCH: 32.3 pg (ref 26.0–34.0)
MCHC: 33.6 g/dL (ref 30.0–36.0)
MCV: 96 fL (ref 80.0–100.0)
Monocytes Absolute: 0.6 10*3/uL (ref 0.1–1.0)
Monocytes Relative: 7 %
Neutro Abs: 4.9 10*3/uL (ref 1.7–7.7)
Neutrophils Relative %: 62 %
Platelets: 301 10*3/uL (ref 150–400)
RBC: 3.53 MIL/uL — ABNORMAL LOW (ref 3.87–5.11)
RDW: 12.3 % (ref 11.5–15.5)
WBC: 7.9 10*3/uL (ref 4.0–10.5)
nRBC: 0 % (ref 0.0–0.2)

## 2022-01-09 LAB — COMPREHENSIVE METABOLIC PANEL
ALT: 20 U/L (ref 0–44)
AST: 20 U/L (ref 15–41)
Albumin: 4.1 g/dL (ref 3.5–5.0)
Alkaline Phosphatase: 70 U/L (ref 38–126)
Anion gap: 7 (ref 5–15)
BUN: 26 mg/dL — ABNORMAL HIGH (ref 8–23)
CO2: 27 mmol/L (ref 22–32)
Calcium: 9.6 mg/dL (ref 8.9–10.3)
Chloride: 102 mmol/L (ref 98–111)
Creatinine, Ser: 1.03 mg/dL — ABNORMAL HIGH (ref 0.44–1.00)
GFR, Estimated: 58 mL/min — ABNORMAL LOW (ref >60)
Glucose, Bld: 249 mg/dL — ABNORMAL HIGH (ref 70–99)
Potassium: 4.1 mmol/L (ref 3.5–5.1)
Sodium: 136 mmol/L (ref 135–145)
Total Bilirubin: 0.9 mg/dL (ref 0.3–1.2)
Total Protein: 7.7 g/dL (ref 6.5–8.1)

## 2022-01-09 LAB — VITAMIN D 25 HYDROXY (VIT D DEFICIENCY, FRACTURES): Vit D, 25-Hydroxy: 52.59 ng/mL (ref 30–100)

## 2022-01-16 ENCOUNTER — Inpatient Hospital Stay (HOSPITAL_BASED_OUTPATIENT_CLINIC_OR_DEPARTMENT_OTHER): Payer: Medicare Other | Admitting: Hematology

## 2022-01-16 VITALS — BP 126/54 | HR 63 | Temp 97.8°F | Resp 18 | Ht 61.81 in | Wt 197.5 lb

## 2022-01-16 DIAGNOSIS — Z170 Estrogen receptor positive status [ER+]: Secondary | ICD-10-CM

## 2022-01-16 DIAGNOSIS — R232 Flushing: Secondary | ICD-10-CM | POA: Diagnosis not present

## 2022-01-16 DIAGNOSIS — C50411 Malignant neoplasm of upper-outer quadrant of right female breast: Secondary | ICD-10-CM | POA: Diagnosis not present

## 2022-01-16 DIAGNOSIS — Z1231 Encounter for screening mammogram for malignant neoplasm of breast: Secondary | ICD-10-CM

## 2022-01-16 DIAGNOSIS — E559 Vitamin D deficiency, unspecified: Secondary | ICD-10-CM

## 2022-01-16 DIAGNOSIS — M2550 Pain in unspecified joint: Secondary | ICD-10-CM | POA: Diagnosis not present

## 2022-01-16 DIAGNOSIS — Z79811 Long term (current) use of aromatase inhibitors: Secondary | ICD-10-CM | POA: Diagnosis not present

## 2022-01-16 DIAGNOSIS — Z9011 Acquired absence of right breast and nipple: Secondary | ICD-10-CM | POA: Diagnosis not present

## 2022-01-16 NOTE — Progress Notes (Signed)
[facility] [address] [place] [address]   Patient Care Team: Coral Spikes, DO as PCP - General (Family Medicine) Derek Jack, MD as Medical Oncologist (Medical Oncology)  SUMMARY OF ONCOLOGIC HISTORY: Oncology History  Breast cancer of upper-outer quadrant of right female breast ([place])  08/31/2021 Initial Diagnosis   Breast cancer of upper-outer quadrant of right female breast [facility])    Genetic Testing   Negative genetic testing. No pathogenic variants identified on the [facility] Multi-Cancer+RNA panel. VUS in [place] called c.1022G>A identified. The report date is 10/24/2021.  The Multi-Cancer Panel + RNA offered by [facility] includes sequencing and/or deletion duplication testing of the following 84 genes: AIP, ALK, APC, ATM, AXIN2,BAP1,  BARD1, BLM, BMPR1A, BRCA1, BRCA2, BRIP1, CASR, CDC73, CDH1, CDK4, CDKN1B, CDKN1C, CDKN2A (p14ARF), CDKN2A (p16INK4a), CEBPA, CHEK2, CTNNA1, DICER1, DIS3L2, EGFR (c.2369C>T, p.Thr790Met variant only), EPCAM (Deletion/duplication testing only), FH, FLCN, GATA2, GPC3, GREM1 (Promoter region deletion/duplication testing only), HOXB13 (c.251G>A, p.Gly84Glu), HRAS, KIT, MAX, MEN1, MET, MITF (c.952G>A, p.Glu318Lys variant only), MLH1, MSH2, MSH3, MSH6, MUTYH, NBN, NF1, NF2, NTHL1, PALB2, PDGFRA, PHOX2B, PMS2, POLD1, POLE, POT1, PRKAR1A, PTCH1, PTEN, RAD50, RAD51C, RAD51D, RB1, RECQL4, RET, RUNX1, SDHAF2, SDHA (sequence changes only), SDHB, SDHC, SDHD, SMAD4, SMARCA4, SMARCB1, SMARCE1, STK11, SUFU, TERC, TERT, TMEM127, TP53, TSC1, TSC2, VHL, WRN and WT1.     CHIEF COMPLIANT: Follow-up of right breast cancer   INTERVAL HISTORY: Kimberly [name] is a 71 y.o. female here today for follow up of her right breast cancer. Her last visit was on 09/19/2021.   Today she reports feeling good. She continues to take anastrozole and reports changes in mood. She reports her hot flashes are improving, and her joint pains are stable. She reports  continued shooting pains in her right arm and under her right arm at the site of her surgery.   REVIEW OF SYSTEMS:   Review of Systems  Constitutional:  Negative for appetite change and fatigue.  Respiratory:  Positive for cough.   Cardiovascular:  Positive for chest pain (6/10 R under arm).  Endocrine: Positive for hot flashes (improving).  Musculoskeletal:  Positive for arthralgias (stable).  Neurological:  Positive for headaches.  Psychiatric/Behavioral:  Positive for depression and sleep disturbance. The patient is nervous/anxious.   All other systems reviewed and are negative.  I have reviewed the past medical history, past surgical history, social history and family history with the patient and they are unchanged from previous note.   ALLERGIES:   is allergic to ace inhibitors.   MEDICATIONS:  Current Outpatient Medications  Medication Sig Dispense Refill   anastrozole (ARIMIDEX) 1 MG tablet Take 1 tablet (1 mg total) by mouth daily. 90 tablet 3   Calcium-Vitamin D 500-3.125 MG-MCG TABS Take by mouth. Takes 2 daily     diclofenac Sodium (VOLTAREN) 1 % GEL Apply 2 g topically daily as needed (knee pain).     famotidine (PEPCID) 40 MG tablet TAKE 1 TABLET DAILY 90 tablet 0   gabapentin (NEURONTIN) 100 MG capsule Take 1 capsule (100 mg total) by mouth 3 (three) times daily. 270 capsule 1   glipiZIDE (GLUCOTROL XL) 5 MG 24 hr tablet Take 1 tablet (5 mg total) by mouth daily with breakfast. 90 tablet 1   hydrochlorothiazide (HYDRODIURIL) 25 MG tablet TAKE 1 TABLET DAILY 90 tablet 0   insulin degludec (TRESIBA FLEXTOUCH) 100 UNIT/ML FlexTouch Pen Inject 25 Units into the skin at bedtime. 15 mL 3   Insulin Pen Needle (B-D  ULTRAFINE III SHORT PEN) 31G X 8 MM MISC 1 each by Does not apply route as directed. Use to inject insulin once daily 100 each 3   levothyroxine (SYNTHROID) 125 MCG tablet TAKE 1 TABLET ONCE DAILY   BEFORE BREAKFAST 90 tablet 0   losartan (COZAAR) 100 MG tablet TAKE  1 TABLET EVERY MORNING 90 tablet 0   metFORMIN (GLUCOPHAGE-XR) 500 MG 24 hr tablet Take 2 tablets (1,000 mg total) by mouth daily with breakfast. 180 tablet 3   metoprolol succinate (TOPROL-XL) 100 MG 24 hr tablet TAKE 1 TABLET AT BEDTIME   WITH OR IMMEDIATELY        FOLLOWING A MEAL 90 tablet 0   omeprazole (PRILOSEC) 20 MG capsule TAKE 1 CAPSULE AT BEDTIME 90 capsule 0   tolterodine (DETROL LA) 4 MG 24 hr capsule Take 1 capsule by mouth once daily 60 capsule 0   tolterodine (DETROL LA) 4 MG 24 hr capsule Take 1 capsule (4 mg total) by mouth daily. 45 capsule 0   venlafaxine XR (EFFEXOR-XR) 75 MG 24 hr capsule TAKE 1 CAPSULE DAILY WITH  BREAKFAST 90 capsule 1   No current facility-administered medications for this visit.   Facility-Administered Medications Ordered in Other Visits  Medication Dose Route Frequency Provider Last Rate Last Admin   bupivacaine liposome (EXPAREL) 1.3 % injection 266 mg  20 mL Infiltration Once Carole Civil, MD         PHYSICAL EXAMINATION: Performance status (ECOG): 1 - Symptomatic but completely ambulatory  Vitals:   01/16/22 1124  BP: (!) 126/54  Pulse: 63  Resp: 18  Temp: 97.8 F (36.6 C)  SpO2: 97%   Wt Readings from Last 3 Encounters:  01/16/22 197 lb 8.5 oz (89.6 kg)  10/31/21 191 lb (86.6 kg)  10/03/21 193 lb (87.5 kg)   Physical Exam Vitals reviewed.  Constitutional:      Appearance: Normal appearance. She is obese.  Cardiovascular:     Rate and Rhythm: Normal rate and regular rhythm.     Pulses: Normal pulses.     Heart sounds: Normal heart sounds.  Pulmonary:     Effort: Pulmonary effort is normal.     Breath sounds: Normal breath sounds.  Chest:  Breasts:    Right: No swelling, bleeding, mass, skin change (mastectomy site WNL) or tenderness.     Left: Normal. No swelling, bleeding, inverted nipple, mass, nipple discharge, skin change or tenderness.  Abdominal:     Palpations: Abdomen is soft. There is no hepatomegaly or  mass.     Tenderness: There is no abdominal tenderness.  Lymphadenopathy:     Upper Body:     Right upper body: No supraclavicular or axillary adenopathy.     Left upper body: No supraclavicular or axillary adenopathy.     Lower Body: No right inguinal adenopathy. No left inguinal adenopathy.  Neurological:     General: No focal deficit present.     Mental Status: She is alert and oriented to person, place, and time.  Psychiatric:        Mood and Affect: Mood normal.        Behavior: Behavior normal.    Breast Exam Chaperone: Thana Ates     LABORATORY DATA:  I have reviewed the data as listed    Latest Ref Rng & Units 01/09/2022   11:21 AM 10/03/2021    2:19 PM 09/05/2021   10:12 AM  CMP  Glucose 70 - 99 mg/dL 249  235   164    BUN 8 - 23 mg/dL 26   20   25     Creatinine 0.44 - 1.00 mg/dL 1.03   0.98   1.00    Sodium 135 - 145 mmol/L 136   138   135    Potassium 3.5 - 5.1 mmol/L 4.1   3.8   4.4    Chloride 98 - 111 mmol/L 102   100   98    CO2 22 - 32 mmol/L 27   23   24     Calcium 8.9 - 10.3 mg/dL 9.6   10.1   10.3    Total Protein 6.5 - 8.1 g/dL 7.7   7.5   7.4    Total Bilirubin 0.3 - 1.2 mg/dL 0.9   0.3   0.4    Alkaline Phos 38 - 126 U/L 70   96   104    AST 15 - 41 U/L 20   25   19     ALT 0 - 44 U/L 20   30   20      No results found for: KTG256 Lab Results  Component Value Date   WBC 7.9 01/09/2022   HGB 11.4 (L) 01/09/2022   HCT 33.9 (L) 01/09/2022   MCV 96.0 01/09/2022   PLT 301 01/09/2022   NEUTROABS 4.9 01/09/2022    ASSESSMENT:  Stage IIa (T1CN1) right breast upper outer quadrant IDC: - Abnormal mammogram on 06/02/2021. - Right breast 9:00 mass biopsy on 06/30/2021, invasive ductal carcinoma, ER 70% positive, PR negative, Ki-67 2%, HER2 1+.  Right axillary lymph node biopsy was positive for metastatic carcinoma. - She met with Dr. Marla Roe for possible reconstruction but decided against it. - She underwent right breast mastectomy with lymph node  biopsy on 07/29/2021. - Pathology showed 1.1 cm grade 2 IDC, associated DCIS, margins negative.  Metastatic carcinoma involving 1/9 lymph nodes.  PT1CPN1A. - Oncotype DX recurrence score 22    Social/family history: - She lives at home with her husband.  Today she is seen with her daughter. 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  Non-smoker. - Father had squamous cell carcinoma of the lip which has metastasized.  Maternal grandmother had colon cancer.  Paternal uncle had prostate cancer and paternal aunt had breast cancer.     PLAN:  Stage IIa (T1CN1) right breast IDC, ER positive, PR/HER2 negative: - She is tolerating anastrozole reasonably well.  Hot flashes are getting better.  She has some arthralgias and mood changes which are manageable. - Reviewed labs today which showed creatinine 1.03 and normal LFTs.  CBC was grossly normal. - Physical examination shows right mastectomy site is within normal limits.  Left breast has no palpable masses. - Recommend continuing anastrozole.  RTC 4 months for follow-up with repeat labs.  We will schedule her for left breast mammogram on 06/02/2022.  2.  Bone health: - DEXA scan with T score -0.5 on 09/12/2021. - Vitamin D is 52.9. - Weekly vitamin D was discontinued.  I have recommended her to start taking vitamin D 2000 units daily.  Breast Cancer therapy associated bone loss: I have recommended calcium, Vitamin D and weight bearing exercises.  Orders placed this encounter:  Orders Placed This Encounter  Procedures   MM Digital Screening Unilat L    The patient has a good understanding of the overall plan. She agrees with it. She will call with any problems that may develop  before the next visit here.  Derek Jack, MD [place] [phone]   I, Thana Ates, am acting as a scribe for Dr. Derek Jack.  I, Derek Jack MD, have reviewed the above documentation for accuracy  and completeness, and I agree with the above.

## 2022-01-16 NOTE — Patient Instructions (Signed)
[facility] at [facility] Discharge Instructions  You were seen and examined today by Dr. Delton Coombes.  Dr.Katragadda reviewed your recent lab work which is stable. You are experiencing known side effects of Anastrozole. Please continue as prescribed.  Please take 2000 units of Vitamin D daily.  Your next mammogram is due in October.  You will see Dr. Delton Coombes following your next mammogram.   Thank you for choosing [place] at [facility] to provide your oncology and hematology care.  To afford each patient quality time with our provider, please arrive at least 15 minutes before your scheduled appointment time.   If you have a lab appointment with the [facility] please come in thru the Main Entrance and check in at the main information desk.  You need to re-schedule your appointment should you arrive 10 or more minutes late.  We strive to give you quality time with our providers, and arriving late affects you and other patients whose appointments are after yours.  Also, if you no show three or more times for appointments you may be dismissed from the clinic at the providers discretion.     Again, thank you for choosing [facility].  Our hope is that these requests will decrease the amount of time that you wait before being seen by our physicians.       _____________________________________________________________  Should you have questions after your visit to [facility], please contact our office at [phone] and follow the prompts.  Our office hours are 8:00 a.m. and 4:30 p.m. Monday - Friday.  Please note that voicemails left after 4:00 p.m. may not be returned until the following business day.  We are closed weekends and major holidays.  You do have access to a nurse 24-7, just call the main number to the clinic [phone] and do not press any options, hold on the line and a nurse will answer the phone.     For prescription refill requests, have your pharmacy contact our office and allow 72 hours.    Due to Covid, you will need to wear a mask upon entering the hospital. If you do not have a mask, a mask will be given to you at the Main Entrance upon arrival. For doctor visits, patients may have 1 support person age 78 or older with them. For treatment visits, patients can not have anyone with them due to social distancing guidelines and our immunocompromised population.

## 2022-01-25 ENCOUNTER — Other Ambulatory Visit: Payer: Self-pay | Admitting: Nurse Practitioner

## 2022-01-25 DIAGNOSIS — E039 Hypothyroidism, unspecified: Secondary | ICD-10-CM

## 2022-01-26 ENCOUNTER — Telehealth: Payer: Self-pay | Admitting: Family Medicine

## 2022-01-26 NOTE — Telephone Encounter (Signed)
Left message for patient to call back and schedule Medicare Annual Wellness Visit (AWV) in office.   If unable to come into the office for AWV,  please offer to do virtually or by telephone.  Last AWV: 04/09/2020  Please schedule at anytime with [facility]Nurse Health Advisor.  30 minute appointment for Virtual or phone  45 minute appointment for in office or Initial virtual/phone  Any questions, please contact me at [phone]

## 2022-01-27 DIAGNOSIS — E039 Hypothyroidism, unspecified: Secondary | ICD-10-CM | POA: Diagnosis not present

## 2022-01-28 LAB — TSH: TSH: 0.24 u[IU]/mL — ABNORMAL LOW (ref 0.450–4.500)

## 2022-01-28 LAB — T4, FREE: Free T4: 1.31 ng/dL (ref 0.82–1.77)

## 2022-02-02 ENCOUNTER — Ambulatory Visit (INDEPENDENT_AMBULATORY_CARE_PROVIDER_SITE_OTHER): Payer: Medicare Other | Admitting: Nurse Practitioner

## 2022-02-02 ENCOUNTER — Encounter: Payer: Self-pay | Admitting: Nurse Practitioner

## 2022-02-02 VITALS — BP 132/80 | HR 63 | Ht 61.81 in | Wt 196.0 lb

## 2022-02-02 DIAGNOSIS — E782 Mixed hyperlipidemia: Secondary | ICD-10-CM | POA: Diagnosis not present

## 2022-02-02 DIAGNOSIS — E1121 Type 2 diabetes mellitus with diabetic nephropathy: Secondary | ICD-10-CM | POA: Diagnosis not present

## 2022-02-02 DIAGNOSIS — I10 Essential (primary) hypertension: Secondary | ICD-10-CM | POA: Diagnosis not present

## 2022-02-02 DIAGNOSIS — E039 Hypothyroidism, unspecified: Secondary | ICD-10-CM | POA: Diagnosis not present

## 2022-02-02 LAB — POCT GLYCOSYLATED HEMOGLOBIN (HGB A1C): HbA1c POC (<> result, manual entry): 7.8 % (ref 4.0–5.6)

## 2022-02-02 NOTE — Progress Notes (Signed)
Endocrinology Follow Up Note       02/02/2022, 7:46 PM   Subjective:    Patient ID: Kimberly [name], female    DOB: 06-10-1951.  Kimberly [name] is being seen in follow up after being seen in consultation for management of currently uncontrolled symptomatic diabetes requested by  Coral Spikes, DO.   Past Medical History:  Diagnosis Date   Anxiety    Arthritis    Depression    Diabetes mellitus    Family history of breast cancer    Family history of colon cancer    Family history of prostate cancer    Family history of skin cancer    GERD (gastroesophageal reflux disease)    High blood pressure    Hypothyroid    Obesity    PONV (postoperative nausea and vomiting)     Past Surgical History:  Procedure Laterality Date   ABDOMINAL HYSTERECTOMY     [place] Right    CARPAL TUNNEL RELEASE Right    KNEE ARTHROSCOPY Right    MASTECTOMY MODIFIED RADICAL Right 07/29/2021   Procedure: MASTECTOMY MODIFIED RADICAL;  Surgeon: Aviva Signs, MD;  Location: [facility];  Service: General;  Laterality: Right;   THYROID SURGERY     removal   TOTAL KNEE ARTHROPLASTY Right 08/25/2013   Procedure: TOTAL KNEE ARTHROPLASTY;  Surgeon: Carole Civil, MD;  Location: [facility];  Service: Orthopedics;  Laterality: Right;   TOTAL KNEE ARTHROPLASTY Left 09/09/2014   Procedure: LEFT TOTAL KNEE ARTHROPLASTY;  Surgeon: Carole Civil, MD;  Location: [facility];  Service: Orthopedics;  Laterality: Left;    Social History   Socioeconomic History   Marital status: Married    Spouse name: Not on file   Number of children: 5   Years of education: Not on file   Highest education level: Not on file  Occupational History   Occupation: CNA    Employer: [employer]  Tobacco Use   Smoking status: Never   Smokeless tobacco: Never  Vaping Use   Vaping Use: Never used  Substance and Sexual Activity   Alcohol use: No   Drug use: No    Sexual activity: Yes    Birth control/protection: Surgical  Other Topics Concern   Not on file  Social History Narrative   Not on file   Social Determinants of Health   Financial Resource Strain: Not on file  Food Insecurity: Not on file  Transportation Needs: Not on file  Physical Activity: Not on file  Stress: Not on file  Social Connections: Not on file    Family History  Problem Relation Age of Onset   Diabetes Mother    Hypertension Mother    Congestive Heart Failure Father    Hypertension Father    Skin cancer Father 49       squamous cell on lip; metastatic   Hypertension Brother    Skin cancer Brother        squamous cell   Breast cancer Paternal Aunt        dx 33s d. 43s   Prostate cancer Paternal Uncle        dx 8s   Colon cancer  Maternal Grandmother        dx 21s d. 78s   Diabetes Other    Cancer Cousin        unk type; possibly pancreatic    Outpatient Encounter Medications as of 02/02/2022  Medication Sig   anastrozole (ARIMIDEX) 1 MG tablet Take 1 tablet (1 mg total) by mouth daily.   Calcium-Vitamin D 500-3.125 MG-MCG TABS Take by mouth. Takes 2 daily   diclofenac Sodium (VOLTAREN) 1 % GEL Apply 2 g topically daily as needed (knee pain).   famotidine (PEPCID) 40 MG tablet TAKE 1 TABLET DAILY   gabapentin (NEURONTIN) 100 MG capsule Take 1 capsule (100 mg total) by mouth 3 (three) times daily.   glipiZIDE (GLUCOTROL XL) 5 MG 24 hr tablet Take 1 tablet (5 mg total) by mouth daily with breakfast.   hydrochlorothiazide (HYDRODIURIL) 25 MG tablet TAKE 1 TABLET DAILY   insulin degludec (TRESIBA FLEXTOUCH) 100 UNIT/ML FlexTouch Pen Inject 25 Units into the skin at bedtime.   Insulin Pen Needle (B-D ULTRAFINE III SHORT PEN) 31G X 8 MM MISC 1 each by Does not apply route as directed. Use to inject insulin once daily   levothyroxine (SYNTHROID) 125 MCG tablet TAKE 1 TABLET ONCE DAILY   BEFORE BREAKFAST   losartan (COZAAR) 100 MG tablet TAKE 1 TABLET EVERY MORNING    metFORMIN (GLUCOPHAGE-XR) 500 MG 24 hr tablet Take 2 tablets (1,000 mg total) by mouth daily with breakfast.   metoprolol succinate (TOPROL-XL) 100 MG 24 hr tablet TAKE 1 TABLET AT BEDTIME   WITH OR IMMEDIATELY        FOLLOWING A MEAL   omeprazole (PRILOSEC) 20 MG capsule TAKE 1 CAPSULE AT BEDTIME   tolterodine (DETROL LA) 4 MG 24 hr capsule Take 1 capsule (4 mg total) by mouth daily.   venlafaxine XR (EFFEXOR-XR) 75 MG 24 hr capsule TAKE 1 CAPSULE DAILY WITH  BREAKFAST   [DISCONTINUED] tolterodine (DETROL LA) 4 MG 24 hr capsule Take 1 capsule by mouth once daily   Facility-Administered Encounter Medications as of 02/02/2022  Medication   bupivacaine liposome (EXPAREL) 1.3 % injection 266 mg    ALLERGIES: Allergies  Allergen Reactions   Ace Inhibitors Cough    VACCINATION STATUS: Immunization History  Administered Date(s) Administered   Fluad Quad(high Dose 65+) 07/12/2020   Influenza,inj,Quad PF,6+ Mos 04/30/2018   Influenza-Unspecified 05/21/2016, 05/03/2017, 06/21/2019   Moderna Sars-Covid-2 Vaccination 10/07/2019, 11/05/2019, 08/02/2020   Pneumococcal Conjugate-13 03/03/2014, 05/30/2017   Pneumococcal Polysaccharide-23 05/27/2010    Diabetes She presents for her follow-up diabetic visit. She has type 2 diabetes mellitus. Onset time: Diagnosed at approx age of 67. Her disease course has been improving. There are no hypoglycemic associated symptoms. Pertinent negatives for hypoglycemia include no nervousness/anxiousness or tremors. Associated symptoms include foot paresthesias. Pertinent negatives for diabetes include no fatigue, no polydipsia, no polyuria and no weight loss. There are no hypoglycemic complications. Symptoms are stable. Diabetic complications include nephropathy and peripheral neuropathy. Risk factors for coronary artery disease include diabetes mellitus, dyslipidemia, family history, obesity, post-menopausal and hypertension. Current diabetic treatment includes oral  agent (dual therapy) and insulin injections. She is compliant with treatment most of the time. Her weight is fluctuating minimally. She is following a generally healthy diet. Meal planning includes avoidance of concentrated sweets. She has had a previous visit with a dietitian. She participates in exercise three times a week. Her home blood glucose trend is decreasing steadily. Her breakfast blood glucose range is generally 140-180 mg/dl. Her  bedtime blood glucose range is generally 140-180 mg/dl. Her overall blood glucose range is 140-180 mg/dl. (She presents today with her logs, no meter, showing stable, slightly above target glycemic profile.  Her POCT A1c today is 7.8%, improving from last visit of 8.5%.  She denies any significant hypoglycemia.) An ACE inhibitor/angiotensin II receptor blocker is being taken. She sees a podiatrist (has seen one in the distant past).Eye exam is current.  Hyperlipidemia This is a chronic problem. The current episode started more than 1 year ago. The problem is uncontrolled. Recent lipid tests were reviewed and are variable. Exacerbating diseases include chronic renal disease, diabetes, hypothyroidism and obesity. Factors aggravating her hyperlipidemia include thiazides and beta blockers. Current antihyperlipidemic treatment includes diet change and exercise. The current treatment provides mild improvement of lipids. There are no compliance problems.  Risk factors for coronary artery disease include diabetes mellitus, dyslipidemia, family history, hypertension, obesity and post-menopausal.  Hypertension This is a chronic problem. The current episode started more than 1 year ago. The problem has been resolved since onset. The problem is controlled. Pertinent negatives include no palpitations. Agents associated with hypertension include thyroid hormones. Risk factors for coronary artery disease include diabetes mellitus, dyslipidemia, family history, obesity and post-menopausal  state. Past treatments include angiotensin blockers, beta blockers and diuretics. The current treatment provides moderate improvement. There are no compliance problems.  Hypertensive end-organ damage includes kidney disease. Identifiable causes of hypertension include chronic renal disease and a thyroid problem.  Thyroid Problem Presents for follow-up visit. Patient reports no anxiety, cold intolerance, constipation, depressed mood, fatigue, heat intolerance, leg swelling, palpitations, tremors, weight gain or weight loss. The symptoms have been stable. Past treatments include levothyroxine. The treatment provided significant relief. Her past medical history is significant for diabetes, hyperlipidemia and neuropathy (? vs chronic back issues).     Review of systems  Constitutional: + Minimally fluctuating body weight,  current Body mass index is 36.07 kg/m. , + fatigue, no subjective hyperthermia, no subjective hypothermia Eyes: no blurry vision, no xerophthalmia ENT: no sore throat, no nodules palpated in throat, no dysphagia/odynophagia, no hoarseness Cardiovascular: no chest pain, no shortness of breath, no palpitations, no leg swelling Respiratory: no cough, no shortness of breath Gastrointestinal: no nausea/vomiting/diarrhea Musculoskeletal: + diffuse muscle/joint aches-r/t meds recently added by oncologist Skin: no rashes, no hyperemia Neurological: no tremors, no numbness, no tingling, no dizziness Psychiatric: no depression, no anxiety  Objective:     BP 132/80   Pulse 63   Ht 5' 1.81" (1.57 m)   Wt 196 lb (88.9 kg)   BMI 36.07 kg/m   Wt Readings from Last 3 Encounters:  02/02/22 196 lb (88.9 kg)  01/16/22 197 lb 8.5 oz (89.6 kg)  10/31/21 191 lb (86.6 kg)     BP Readings from Last 3 Encounters:  02/02/22 132/80  01/16/22 (!) 126/54  10/31/21 117/68     Physical Exam- Limited  Constitutional:  Body mass index is 36.07 kg/m. , not in acute distress, normal state of  mind Eyes:  EOMI, no exophthalmos Neck: Supple Cardiovascular: RRR, no murmurs, rubs, or gallops, no edema Respiratory: Adequate breathing efforts, no crackles, rales, rhonchi, or wheezing Musculoskeletal: no gross deformities, strength intact in all four extremities, no gross restriction of joint movements Skin:  no rashes, no hyperemia Neurological: no tremor with outstretched hands   Diabetic Foot Exam - Simple   No data filed      CMP ( most recent) CMP     Component Value  Date/Time   NA 136 01/09/2022 1121   NA 138 10/03/2021 1419   K 4.1 01/09/2022 1121   CL 102 01/09/2022 1121   CO2 27 01/09/2022 1121   GLUCOSE 249 (H) 01/09/2022 1121   BUN 26 (H) 01/09/2022 1121   BUN 20 10/03/2021 1419   CREATININE 1.03 (H) 01/09/2022 1121   CREATININE 1.06 08/11/2014 0701   CALCIUM 9.6 01/09/2022 1121   PROT 7.7 01/09/2022 1121   PROT 7.5 10/03/2021 1419   ALBUMIN 4.1 01/09/2022 1121   ALBUMIN 4.3 10/03/2021 1419   AST 20 01/09/2022 1121   ALT 20 01/09/2022 1121   ALKPHOS 70 01/09/2022 1121   BILITOT 0.9 01/09/2022 1121   BILITOT 0.3 10/03/2021 1419   GFRNONAA 58 (L) 01/09/2022 1121   GFRAA 65 10/11/2020 1146     Diabetic Labs (most recent): Lab Results  Component Value Date   HGBA1C 7.8 02/02/2022   HGBA1C 8.5 (A) 10/31/2021   HGBA1C 8.6 (H) 07/27/2021   MICROALBUR 1.1 06/03/2014   MICROALBUR 3.48 (H) 07/09/2013     Lipid Panel ( most recent) Lipid Panel     Component Value Date/Time   CHOL 163 04/11/2021 1042   TRIG 253 (H) 04/11/2021 1042   HDL 42 04/11/2021 1042   CHOLHDL 3.9 04/11/2021 1042   CHOLHDL 3.7 06/03/2014 0709   VLDL 40 06/03/2014 0709   LDLCALC 79 04/11/2021 1042   LABVLDL 42 (H) 04/11/2021 1042      Lab Results  Component Value Date   TSH 0.240 (L) 01/27/2022   TSH 0.133 (L) 10/03/2021   TSH 0.653 04/11/2021   TSH 1.490 04/19/2020   TSH 2.470 07/31/2019   TSH 0.528 04/22/2018   TSH 0.256 (L) 12/26/2017   TSH 1.030 07/08/2015    TSH 3.790 11/11/2014   TSH 0.177 (L) 06/03/2014   FREET4 1.31 01/27/2022   FREET4 1.65 10/03/2021           Assessment & Plan:   1) Type 2 diabetes mellitus with diabetic nephropathy, without long-term current use of insulin ([place])  She presents today with her logs, no meter, showing stable, slightly above target glycemic profile.  Her POCT A1c today is 7.8%, improving from last visit of 8.5%.  She denies any significant hypoglycemia.  - Kimberly [name] has currently uncontrolled symptomatic type 2 DM since 71 years of age.  -Recent labs reviewed.  - I had a long discussion with her about the progressive nature of diabetes and the pathology behind its complications. -her diabetes is complicated by mild CKD, peripheral neuropathy and she remains at a high risk for more acute and chronic complications which include CAD, CVA, CKD, retinopathy, and neuropathy. These are all discussed in detail with her.  - Nutritional counseling repeated at each appointment due to patients tendency to fall back in to old habits.  - The patient admits there is a room for improvement in their diet and drink choices. -  Suggestion is made for the patient to avoid simple carbohydrates from their diet including Cakes, Sweet Desserts / Pastries, Ice Cream, Soda (diet and regular), Sweet Tea, Candies, Chips, Cookies, Sweet Pastries, Store Bought Juices, Alcohol in Excess of 1-2 drinks a day, Artificial Sweeteners, Coffee Creamer, and "Sugar-free" Products. This will help patient to have stable blood glucose profile and potentially avoid unintended weight gain.   - I encouraged the patient to switch to unprocessed or minimally processed complex starch and increased protein intake (animal or plant source), fruits, and vegetables.   -  Patient is advised to stick to a routine mealtimes to eat 3 meals a day and avoid unnecessary snacks (to snack only to correct hypoglycemia).  - she declines referral to Jearld Fenton, RDN, CDE for diabetes education at this time.  - I have approached her with the following individualized plan to manage her diabetes and patient agrees:   -She is advised to continue her Tresiba 25 units SQ nightly and Glipizide 5 mg XL daily with breakfast.  Will lower her Metformin to 500 mg ER daily with breakfast given recent decline in renal function.   -she is encouraged to continue monitoring blood glucose twice daily, before breakfast and before bed, and to call the clinic if she has readings less than 70 or above 200 for 3 tests in a row.   She could greatly benefit from CGM device to see how foods affect her glucose.  I ordered Dexcom G7 through [employer].  - she will be considered for incretin therapy as appropriate next visit.  - Specific targets for  A1c; LDL, HDL, and Triglycerides were discussed with the patient.  2) Blood Pressure /Hypertension:  her blood pressure is controlled to target.   she is advised to continue her current medications including HCTZ 25 mg po daily, Losartan 100 mg p.o. daily with breakfast, and Metoprolol 100 mg po daily.  3) Lipids/Hyperlipidemia:    Review of her recent lipid panel from 04/11/21 showed controlled LDL at 79 and elevated triglycerides of 253.  She prefers to stay away from statins at this time.  She is advised to avoid fried foods and butter.    4)  Weight/Diet:  her Body mass index is 36.07 kg/m.  -  clearly complicating her diabetes care.   she is a candidate for weight loss. I discussed with her the fact that loss of 5 - 10% of her  current body weight will have the most impact on her diabetes management.  Exercise, and detailed carbohydrates information provided  -  detailed on discharge instructions.  5) Vitamin D Deficiency Her recent vitamin D level was 15.64 on 08/31/21- checked by her oncologist.  She was initiated on Ergocalciferol 50000 units weekly.  6) Hypothyroidism-unspecified   Her previsit thyroid function tests  are consistent with appropriate hormone replacement.  She is advised to continue Levothyroxine 125 mcg po daily before breakfast.    - The correct intake of thyroid hormone (Levothyroxine, Synthroid), is on empty stomach first thing in the morning, with water, separated by at least 30 minutes from breakfast and other medications,  and separated by more than 4 hours from calcium, iron, multivitamins, acid reflux medications (PPIs).  - This medication is a life-long medication and will be needed to correct thyroid hormone imbalances for the rest of your life.  The dose may change from time to time, based on thyroid blood work.  - It is extremely important to be consistent taking this medication, near the same time each morning.  -AVOID TAKING PRODUCTS CONTAINING BIOTIN (commonly found in Hair, Skin, Nails vitamins) AS IT INTERFERES WITH THE VALIDITY OF THYROID FUNCTION BLOOD TESTS.  6) Chronic Care/Health Maintenance: -she is on ACEI/ARB medications and is encouraged to initiate and continue to follow up with Ophthalmology, Dentist, Podiatrist at least yearly or according to recommendations, and advised to stay away from smoking. I have recommended yearly flu vaccine and pneumonia vaccine at least every 5 years; moderate intensity exercise for up to 150 minutes weekly; and sleep for at least  7 hours a day.  - she is advised to maintain close follow up with Coral Spikes, DO for primary care needs, as well as her other providers for optimal and coordinated care.        I spent 31 minutes in the care of the patient today including review of labs from [facility], Lipids, Thyroid Function, Hematology (current and previous including abstractions from other facilities); face-to-face time discussing  her blood glucose readings/logs, discussing hypoglycemia and hyperglycemia episodes and symptoms, medications doses, her options of short and long term treatment based on the latest standards of care / guidelines;   discussion about incorporating lifestyle medicine;  and documenting the encounter.    Please refer to Patient Instructions for Blood Glucose Monitoring and Insulin/Medications Dosing Guide"  in media tab for additional information. Please  also refer to " Patient Self Inventory" in the Media  tab for reviewed elements of pertinent patient history.  Kimberly [name] participated in the discussions, expressed understanding, and voiced agreement with the above plans.  All questions were answered to her satisfaction. she is encouraged to contact clinic should she have any questions or concerns prior to her return visit.     Follow up plan: - Return in about 3 months (around 05/05/2022) for Diabetes F/U with A1c in office, No previsit labs, Bring meter and logs.   Rayetta Pigg, [facility] [address] [place], [place] [address] Phone: [phone] Fax: [phone]  02/02/2022, 7:46 PM

## 2022-02-02 NOTE — Patient Instructions (Signed)
Diabetes Mellitus and Foot Care Foot care is an important part of your health, especially when you have diabetes. Diabetes may cause you to have problems because of poor blood flow (circulation) to your feet and legs, which can cause your skin to: Become thinner and drier. Break more easily. Heal more slowly. Peel and crack. You may also have nerve damage (neuropathy) in your legs and feet, causing decreased feeling in them. This means that you may not notice minor injuries to your feet that could lead to more serious problems. Noticing and addressing any potential problems early is the best way to prevent future foot problems. How to care for your feet Foot hygiene  Wash your feet daily with warm water and mild soap. Do not use hot water. Then, pat your feet and the areas between your toes until they are completely dry. Do not soak your feet as this can dry your skin. Trim your toenails straight across. Do not dig under them or around the cuticle. File the edges of your nails with an emery board or nail file. Apply a moisturizing lotion or petroleum jelly to the skin on your feet and to dry, brittle toenails. Use lotion that does not contain alcohol and is unscented. Do not apply lotion between your toes. Shoes and socks Wear clean socks or stockings every day. Make sure they are not too tight. Do not wear knee-high stockings since they may decrease blood flow to your legs. Wear shoes that fit properly and have enough cushioning. Always look in your shoes before you put them on to be sure there are no objects inside. To break in new shoes, wear them for just a few hours a day. This prevents injuries on your feet. Wounds, scrapes, corns, and calluses  Check your feet daily for blisters, cuts, bruises, sores, and redness. If you cannot see the bottom of your feet, use a mirror or ask someone for help. Do not cut corns or calluses or try to remove them with medicine. If you find a minor scrape,  cut, or break in the skin on your feet, keep it and the skin around it clean and dry. You may clean these areas with mild soap and water. Do not clean the area with peroxide, alcohol, or iodine. If you have a wound, scrape, corn, or callus on your foot, look at it several times a day to make sure it is healing and not infected. Check for: Redness, swelling, or pain. Fluid or blood. Warmth. Pus or a bad smell. General tips Do not cross your legs. This may decrease blood flow to your feet. Do not use heating pads or hot water bottles on your feet. They may burn your skin. If you have lost feeling in your feet or legs, you may not know this is happening until it is too late. Protect your feet from hot and cold by wearing shoes, such as at the beach or on hot pavement. Schedule a complete foot exam at least once a year (annually) or more often if you have foot problems. Report any cuts, sores, or bruises to your health care provider immediately. Where to find more information American Diabetes Association: www.diabetes.org Association of Diabetes Care & Education Specialists: www.diabeteseducator.org Contact a health care provider if: You have a medical condition that increases your risk of infection and you have any cuts, sores, or bruises on your feet. You have an injury that is not healing. You have redness on your legs or feet. You   feel burning or tingling in your legs or feet. You have pain or cramps in your legs and feet. Your legs or feet are numb. Your feet always feel cold. You have pain around any toenails. Get help right away if: You have a wound, scrape, corn, or callus on your foot and: You have pain, swelling, or redness that gets worse. You have fluid or blood coming from the wound, scrape, corn, or callus. Your wound, scrape, corn, or callus feels warm to the touch. You have pus or a bad smell coming from the wound, scrape, corn, or callus. You have a fever. You have a red  line going up your leg. Summary Check your feet every day for blisters, cuts, bruises, sores, and redness. Apply a moisturizing lotion or petroleum jelly to the skin on your feet and to dry, brittle toenails. Wear shoes that fit properly and have enough cushioning. If you have foot problems, report any cuts, sores, or bruises to your health care provider immediately. Schedule a complete foot exam at least once a year (annually) or more often if you have foot problems. This information is not intended to replace advice given to you by your health care provider. Make sure you discuss any questions you have with your health care provider. Document Revised: 03/04/2020 Document Reviewed: 03/04/2020 Elsevier Patient Education  2023 [employer]  

## 2022-02-07 ENCOUNTER — Ambulatory Visit (INDEPENDENT_AMBULATORY_CARE_PROVIDER_SITE_OTHER): Payer: Medicare Other

## 2022-02-07 VITALS — Ht 62.0 in | Wt 196.0 lb

## 2022-02-07 DIAGNOSIS — Z0000 Encounter for general adult medical examination without abnormal findings: Secondary | ICD-10-CM | POA: Diagnosis not present

## 2022-02-07 NOTE — Progress Notes (Signed)
Subjective:   Kimberly [name] is a 71 y.o. female who presents for Medicare Annual (Subsequent) preventive examination. Virtual Visit via Telephone Note  I connected with  Kimberly [name] on 02/07/22 at  1:30 PM EDT by telephone and verified that I am speaking with the correct person using two identifiers.  Location: Patient: HOME Provider: [facility] Persons participating in the virtual visit: patient/Nurse Health Advisor   I discussed the limitations, risks, security and privacy concerns of performing an evaluation and management service by telephone and the availability of in person appointments. The patient expressed understanding and agreed to proceed.  Interactive audio and video telecommunications were attempted between this nurse and patient, however failed, due to patient having technical difficulties OR patient did not have access to video capability.  We continued and completed visit with audio only.  Some vital signs may be absent or patient reported.   Kimberly Driver, LPN  Review of Systems     Cardiac Risk Factors include: advanced age (>66mn, >>17women);diabetes mellitus;hypertension;sedentary lifestyle;obesity (BMI >30kg/m2)     Objective:    Today's Vitals   02/07/22 1526 02/07/22 1527  Weight: 196 lb (88.9 kg)   Height: '5\' 2"'$  (1.575 m)   PainSc:  5    Body mass index is 35.85 kg/m.     02/07/2022    3:34 PM 01/16/2022   11:33 AM 09/19/2021   10:32 AM 08/31/2021    1:28 PM 07/29/2021   12:00 PM 07/27/2021    2:38 PM 10/02/2014    1:45 PM  Advanced Directives  Does Patient Have a Medical Advance Directive? Yes No Yes Yes Yes Yes No  Type of AParamedicof [place]Living will  H[place]Living will HCanal [place]Living will HS[place]Living will H[place]uc[place]Living will   Does patient want to make changes to medical advance directive?    No - Patient declined No -  Patient declined No - Patient declined   Copy of HThe Colonyin Chart? No - copy requested  No - copy requested No - copy requested     Would patient like information on creating a medical advance directive?  No - Patient declined     No - patient declined information    Current Medications (verified) Outpatient Encounter Medications as of 02/07/2022  Medication Sig   anastrozole (ARIMIDEX) 1 MG tablet Take 1 tablet (1 mg total) by mouth daily.   Calcium-Vitamin D 500-3.125 MG-MCG TABS Take by mouth. Takes 2 daily   diclofenac Sodium (VOLTAREN) 1 % GEL Apply 2 g topically daily as needed (knee pain).   famotidine (PEPCID) 40 MG tablet TAKE 1 TABLET DAILY   gabapentin (NEURONTIN) 100 MG capsule Take 1 capsule (100 mg total) by mouth 3 (three) times daily.   glipiZIDE (GLUCOTROL XL) 5 MG 24 hr tablet Take 1 tablet (5 mg total) by mouth daily with breakfast.   hydrochlorothiazide (HYDRODIURIL) 25 MG tablet TAKE 1 TABLET DAILY   insulin degludec (TRESIBA FLEXTOUCH) 100 UNIT/ML FlexTouch Pen Inject 25 Units into the skin at bedtime.   Insulin Pen Needle (B-D ULTRAFINE III SHORT PEN) 31G X 8 MM MISC 1 each by Does not apply route as directed. Use to inject insulin once daily   levothyroxine (SYNTHROID) 125 MCG tablet TAKE 1 TABLET ONCE DAILY   BEFORE BREAKFAST   losartan (COZAAR) 100 MG tablet TAKE 1 TABLET EVERY MORNING   metFORMIN (GLUCOPHAGE-XR) 500  MG 24 hr tablet Take 2 tablets (1,000 mg total) by mouth daily with breakfast.   metoprolol succinate (TOPROL-XL) 100 MG 24 hr tablet TAKE 1 TABLET AT BEDTIME   WITH OR IMMEDIATELY        FOLLOWING A MEAL   omeprazole (PRILOSEC) 20 MG capsule TAKE 1 CAPSULE AT BEDTIME   tolterodine (DETROL LA) 4 MG 24 hr capsule Take 1 capsule (4 mg total) by mouth daily.   venlafaxine XR (EFFEXOR-XR) 75 MG 24 hr capsule TAKE 1 CAPSULE DAILY WITH  BREAKFAST   Facility-Administered Encounter Medications as of 02/07/2022  Medication   bupivacaine  liposome (EXPAREL) 1.3 % injection 266 mg    Allergies (verified) Ace inhibitors   History: Past Medical History:  Diagnosis Date   Anxiety    Arthritis    Depression    Diabetes mellitus    Family history of breast cancer    Family history of colon cancer    Family history of prostate cancer    Family history of skin cancer    GERD (gastroesophageal reflux disease)    High blood pressure    Hypothyroid    Obesity    PONV (postoperative nausea and vomiting)    Past Surgical History:  Procedure Laterality Date   ABDOMINAL HYSTERECTOMY     [place] Right    CARPAL TUNNEL RELEASE Right    KNEE ARTHROSCOPY Right    MASTECTOMY MODIFIED RADICAL Right 07/29/2021   Procedure: MASTECTOMY MODIFIED RADICAL;  Surgeon: Aviva Signs, MD;  Location: [facility];  Service: General;  Laterality: Right;   THYROID SURGERY     removal   TOTAL KNEE ARTHROPLASTY Right 08/25/2013   Procedure: TOTAL KNEE ARTHROPLASTY;  Surgeon: Carole Civil, MD;  Location: [facility];  Service: Orthopedics;  Laterality: Right;   TOTAL KNEE ARTHROPLASTY Left 09/09/2014   Procedure: LEFT TOTAL KNEE ARTHROPLASTY;  Surgeon: Carole Civil, MD;  Location: [facility];  Service: Orthopedics;  Laterality: Left;   Family History  Problem Relation Age of Onset   Diabetes Mother    Hypertension Mother    Congestive Heart Failure Father    Hypertension Father    Skin cancer Father 69       squamous cell on lip; metastatic   Hypertension Brother    Skin cancer Brother        squamous cell   Breast cancer Paternal Aunt        dx 41s d. 15s   Prostate cancer Paternal Uncle        dx 73s   Colon cancer Maternal Grandmother        dx 63s d. 48s   Diabetes Other    Cancer Cousin        unk type; possibly pancreatic   Social History   Socioeconomic History   Marital status: Married    Spouse name: Not on file   Number of children: 5   Years of education: Not on file   Highest education level: Not on file   Occupational History   Occupation: CNA    Employer: [employer]  Tobacco Use   Smoking status: Never   Smokeless tobacco: Never  Vaping Use   Vaping Use: Never used  Substance and Sexual Activity   Alcohol use: No   Drug use: No   Sexual activity: Yes    Birth control/protection: Surgical  Other Topics Concern   Not on file  Social History Narrative   Not on file   Social  Determinants of Health   Financial Resource Strain: Low Risk  (02/07/2022)   Overall Financial Resource Strain (CARDIA)    Difficulty of Paying Living Expenses: Not hard at all  Food Insecurity: No Food Insecurity (02/07/2022)   Hunger Vital Sign    Worried About Running Out of Food in the Last Year: Never true    Ran Out of Food in the Last Year: Never true  Transportation Needs: No Transportation Needs (02/07/2022)   PRAPARE - Hydrologist (Medical): No    Lack of Transportation (Non-Medical): No  Physical Activity: Insufficiently Active (02/07/2022)   Exercise Vital Sign    Days of Exercise per Week: 2 days    Minutes of Exercise per Session: 60 min  Stress: No Stress Concern Present (02/07/2022)   Lydia    Feeling of Stress : Only a little  Social Connections: Socially Integrated (02/07/2022)   Social Connection and Isolation Panel [NHANES]    Frequency of Communication with Friends and Family: More than three times a week    Frequency of Social Gatherings with Friends and Family: More than three times a week    Attends Religious Services: More than 4 times per year    Active Member of [employer] or Organizations: Yes    Attends Music therapist: More than 4 times per year    Marital Status: Married    Tobacco Counseling Counseling given: Not Answered   Clinical Intake:  Pre-visit preparation completed: Yes  Pain : 0-10 Pain Score: 5  Pain Type: Chronic pain Pain Location: Foot Pain  Descriptors / Indicators: Aching, Burning Pain Onset: More than a month ago Pain Frequency: Intermittent     BMI - recorded: 35.85 Nutritional Status: BMI > 30  Obese Nutritional Risks: None Diabetes: Yes  How often do you need to have someone help you when you read instructions, pamphlets, or other written materials from your doctor or pharmacy?: 1 - Never  Diabetic?Nutrition Risk Assessment:  Has the patient had any N/V/D within the last 2 months?  No  Does the patient have any non-healing wounds?  No  Has the patient had any unintentional weight loss or weight gain?  No   Diabetes:  Is the patient diabetic?  Yes  If diabetic, was a CBG obtai\ned today?  No  Did the patient bring in their glucometer from home?  No  How often do you monitor your CBG's? BID.   Financial Strains and Diabetes Management:  Are you having any financial strains with the device, your supplies or your medication? No .  Does the patient want to be seen by Chronic Care Management for management of their diabetes?  No  Would the patient like to be referred to a Nutritionist or for Diabetic Management?  No   Diabetic Exams:  Diabetic Eye Exam: Completed 2022. Pt has been advised about the importance in completing this exam. A referral has been placed today. Message sent to referral coordinator for scheduling purposes. Advised pt to expect a call from office referred to regarding appt.  Diabetic Foot Exam: Completed 09/01/2021. Pt has been advised about the importance in completing this exam.  Interpreter Needed?: No  Information entered by :: mj Juaquin [place], lpn   Activities of Daily Living    02/07/2022    3:39 PM 07/29/2021   12:00 PM  In your present state of health, do you have any difficulty performing the following activities:  Hearing? 0 0  Vision? 0 0  Difficulty concentrating or making decisions? 0 0  Walking or climbing stairs? 0 0  Dressing or bathing? 0 0  Doing errands, shopping? 0 1   Preparing Food and eating ? N   Using the Toilet? N   In the past six months, have you accidently leaked urine? Y   Comment on medication.   Do you have problems with loss of bowel control? N   Managing your Medications? N   Managing your Finances? N   Housekeeping or managing your Housekeeping? N     Patient Care Team: Coral Spikes, DO as PCP - General (Family Medicine) Derek Jack, MD as Medical Oncologist (Medical Oncology)  Indicate any recent Medical Services you may have received from other than [facility] providers in the past year (date may be approximate).     Assessment:   This is a routine wellness examination for [place].  Hearing/Vision screen Hearing Screening - Comments:: No hearing issues.  Vision Screening - Comments:: Glasses. Dr. Mindi Junker. 2022.  Dietary issues and exercise activities discussed: Current Exercise Habits: Structured exercise class, Type of exercise: Other - see comments, Time (Minutes): 60, Frequency (Times/Week): 2, Weekly Exercise (Minutes/Week): 120, Exercise limited by: cardiac condition(s)   Goals Addressed             This Visit's Progress    Exercise 3x per week (30 min per time)       Continue to exercise and stay healthy.        Depression Screen    02/07/2022    3:31 PM 04/11/2021   10:00 AM 10/14/2020   11:28 AM 07/13/2020    8:41 AM 04/09/2020   11:13 AM 01/05/2020   10:26 AM 09/05/2017    9:04 AM  PHQ 2/9 Scores  PHQ - 2 Score 1 0 0 4 0 4 6  PHQ- 9 Score   '1 10 2 9 18    '$ Fall Risk    02/07/2022    3:34 PM 09/05/2021    9:14 AM 07/05/2021    2:56 PM 04/11/2021   10:00 AM 10/13/2020    1:16 PM  Fall Risk   Falls in the past year? 0 0 0 0 0  Number falls in past yr: 0 0  0   Injury with Fall? 0 0  0   Risk for fall due to : No Fall Risks No Fall Risks  No Fall Risks   Follow up Falls prevention discussed Falls evaluation completed Falls evaluation completed Falls evaluation completed Falls evaluation completed     [place]:  Any stairs in or around the home? Yes  If so, are there any without handrails? No  Home free of loose throw rugs in walkways, pet beds, electrical cords, etc? Yes  Adequate lighting in your home to reduce risk of falls? Yes   ASSISTIVE DEVICES UTILIZED TO PREVENT FALLS:  Life alert? No  Use of a cane, walker or w/c? No  Grab bars in the bathroom? Yes  Shower chair or bench in shower? Yes  Elevated toilet seat or a handicapped toilet? No   TIMED UP AND GO:  Was the test performed? No .  Phone visit.   Cognitive Function:        02/07/2022    3:40 PM  6CIT Screen  What Year? 0 points  What month? 0 points  What time? 0 points  Count back from 20 0  points  Months in reverse 0 points  Repeat phrase 0 points  Total Score 0 points    Immunizations Immunization History  Administered Date(s) Administered   Fluad Quad(high Dose 65+) 07/12/2020   Influenza,inj,Quad PF,6+ Mos 04/30/2018   Influenza-Unspecified 05/21/2016, 05/03/2017, 06/21/2019   Moderna Sars-Covid-2 Vaccination 10/07/2019, 11/05/2019, 08/02/2020   Pneumococcal Conjugate-13 03/03/2014, 05/30/2017   Pneumococcal Polysaccharide-23 05/27/2010    TDAP status: Due, Education has been provided regarding the importance of this vaccine. Advised may receive this vaccine at local pharmacy or Health Dept. Aware to provide a copy of the vaccination record if obtained from local pharmacy or Health Dept. Verbalized acceptance and understanding.  Flu Vaccine status: Up to date  Pneumococcal vaccine status: Up to date  Covid-19 vaccine status: Completed vaccines  Qualifies for Shingles Vaccine? Yes   Zostavax completed No   Shingrix Completed?: No.    Education has been provided regarding the importance of this vaccine. Patient has been advised to call insurance company to determine out of pocket expense if they have not yet received this vaccine. Advised may also  receive vaccine at local pharmacy or Health Dept. Verbalized acceptance and understanding.  Screening Tests Health Maintenance  Topic Date Due   COVID-19 Vaccine (4 - Booster for Moderna series) 02/23/2022 (Originally 09/27/2020)   Pneumonia Vaccine 81+ Years old (3 - PPSV23 if available, else PCV20) 08/25/2022 (Originally 05/30/2018)   COLONOSCOPY (Pts 45-38yr Insurance coverage will need to be confirmed)  08/25/2022 (Originally 08/15/2020)   Zoster Vaccines- Shingrix (1 of 2) 08/25/2022 (Originally 11/22/1969)   OPHTHALMOLOGY EXAM  03/22/2022   INFLUENZA VACCINE  03/28/2022   HEMOGLOBIN A1C  08/04/2022   FOOT EXAM  09/01/2022   MAMMOGRAM  06/03/2023   TETANUS/TDAP  07/04/2023   DEXA SCAN  Completed   Hepatitis C Screening  Completed   HPV VACCINES  Aged Out    Health Maintenance  There are no preventive care reminders to display for this patient.   Colorectal cancer screening: Type of screening: Colonoscopy. Completed 08/16/2015. Repeat every 5 years  Mammogram status: Completed 06/02/2021. Repeat every year  Bone Density status: Completed 09/12/2021. Results reflect: Bone density results: NORMAL. Repeat every 2 years.  Lung Cancer Screening: (Low Dose CT Chest recommended if Age 70-80years, 30 pack-year currently smoking OR have quit w/in 15years.) does not qualify.   Additional Screening:  Hepatitis C Screening: does qualify; Completed 07/26/2015  Vision Screening: Recommended annual ophthalmology exams for early detection of glaucoma and other disorders of the eye. Is the patient up to date with their annual eye exam?  Yes  Who is the provider or what is the name of the office in which the patient attends annual eye exams? DR. PETTY If pt is not established with a provider, would they like to be referred to a provider to establish care? No .   Dental Screening: Recommended annual dental exams for proper oral hygiene  Community Resource Referral / Chronic Care  Management: CRR required this visit?  No   CCM required this visit?  No      Plan:     I have personally reviewed and noted the following in the patient's chart:   Medical and social history Use of alcohol, tobacco or illicit drugs  Current medications and supplements including opioid prescriptions.  Functional ability and status Nutritional status Physical activity Advanced directives List of other physicians Hospitalizations, surgeries, and ER visits in previous 12 months Vitals Screenings to include cognitive, depression, and falls Referrals  and appointments  In addition, I have reviewed and discussed with patient certain preventive protocols, quality metrics, and best practice recommendations. A written personalized care plan for preventive services as well as general preventive health recommendations were provided to patient.     Kimberly Driver, LPN   [id number]   Nurse Note: Discussed Colonoscopy. Pt states she will call and schedule in the next few months, in between [place] appointments.

## 2022-02-07 NOTE — Patient Instructions (Signed)
Kimberly [name] , Thank you for taking time to come for your Medicare Wellness Visit. I appreciate your ongoing commitment to your health goals. Please review the following plan we discussed and let me know if I can assist you in the future.   Screening recommendations/referrals: Colonoscopy: Done 08/15/2014 Repeat in 5 years  Mammogram: Done 06/02/2021 Repeat annually  Bone Density: Done 09/12/2021 Repeat every 2 years  Recommended yearly ophthalmology/optometry visit for glaucoma screening and checkup Recommended yearly dental visit for hygiene and checkup  Vaccinations: Influenza vaccine: Due Fall 2022. Pneumococcal vaccine: Done 05/30/2017, 03/03/2014 and 05/27/2010 Tdap vaccine: Due Repeat in 10 years  Shingles vaccine: Discussed.  Covid-19:Done 08/02/2020, 11/05/2019 and 10/07/2019.   Advanced directives: Please bring a copy of your health care power of attorney and living will to the office to be added to your chart at your convenience.   Conditions/risks identified: Aim for 30 minutes of exercise or brisk walking, 6-8 glasses of water, and 5 servings of fruits and vegetables each day. KEEP UP THE GOOD WORK!!  Next appointment: Follow up in one year for your annual wellness visit 2024.   Preventive Care 20 Years and Older, Female Preventive care refers to lifestyle choices and visits with your health care provider that can promote health and wellness. What does preventive care include? A yearly physical exam. This is also called an annual well check. Dental exams once or twice a year. Routine eye exams. Ask your health care provider how often you should have your eyes checked. Personal lifestyle choices, including: Daily care of your teeth and gums. Regular physical activity. Eating a healthy diet. Avoiding tobacco and drug use. Limiting alcohol use. Practicing safe sex. Taking low-dose aspirin every day. Taking vitamin and mineral supplements as recommended by your health care  provider. What happens during an annual well check? The services and screenings done by your health care provider during your annual well check will depend on your age, overall health, lifestyle risk factors, and family history of disease. Counseling  Your health care provider may ask you questions about your: Alcohol use. Tobacco use. Drug use. Emotional well-being. Home and relationship well-being. Sexual activity. Eating habits. History of falls. Memory and ability to understand (cognition). Work and work Statistician. Reproductive health. Screening  You may have the following tests or measurements: Height, weight, and BMI. Blood pressure. Lipid and cholesterol levels. These may be checked every 5 years, or more frequently if you are over 56 years old. Skin check. Lung cancer screening. You may have this screening every year starting at age 39 if you have a 30-pack-year history of smoking and currently smoke or have quit within the past 15 years. Fecal occult blood test (FOBT) of the stool. You may have this test every year starting at age 35. Flexible sigmoidoscopy or colonoscopy. You may have a sigmoidoscopy every 5 years or a colonoscopy every 10 years starting at age 56. Hepatitis C blood test. Hepatitis B blood test. Sexually transmitted disease (STD) testing. Diabetes screening. This is done by checking your blood sugar (glucose) after you have not eaten for a while (fasting). You may have this done every 1-3 years. Bone density scan. This is done to screen for osteoporosis. You may have this done starting at age 60. Mammogram. This may be done every 1-2 years. Talk to your health care provider about how often you should have regular mammograms. Talk with your health care provider about your test results, treatment options, and if necessary, the need  for more tests. Vaccines  Your health care provider may recommend certain vaccines, such as: Influenza vaccine. This is  recommended every year. Tetanus, diphtheria, and acellular pertussis (Tdap, Td) vaccine. You may need a Td booster every 10 years. Zoster vaccine. You may need this after age 61. Pneumococcal 13-valent conjugate (PCV13) vaccine. One dose is recommended after age 2. Pneumococcal polysaccharide (PPSV23) vaccine. One dose is recommended after age 36. Talk to your health care provider about which screenings and vaccines you need and how often you need them. This information is not intended to replace advice given to you by your health care provider. Make sure you discuss any questions you have with your health care provider. Document Released: 09/10/2015 Document Revised: 05/03/2016 Document Reviewed: 06/15/2015 Elsevier Interactive Patient Education  2017 [place] Prevention in the Home Falls can cause injuries. They can happen to people of all ages. There are many things you can do to make your home safe and to help prevent falls. What can I do on the outside of my home? Regularly fix the edges of walkways and driveways and fix any cracks. Remove anything that might make you trip as you walk through a door, such as a raised step or threshold. Trim any bushes or trees on the path to your home. Use bright outdoor lighting. Clear any walking paths of anything that might make someone trip, such as rocks or tools. Regularly check to see if handrails are loose or broken. Make sure that both sides of any steps have handrails. Any raised decks and porches should have guardrails on the edges. Have any leaves, snow, or ice cleared regularly. Use sand or salt on walking paths during winter. Clean up any spills in your garage right away. This includes oil or grease spills. What can I do in the bathroom? Use night lights. Install grab bars by the toilet and in the tub and shower. Do not use towel bars as grab bars. Use non-skid mats or decals in the tub or shower. If you need to sit down in  the shower, use a plastic, non-slip stool. Keep the floor dry. Clean up any water that spills on the floor as soon as it happens. Remove soap buildup in the tub or shower regularly. Attach bath mats securely with double-sided non-slip rug tape. Do not have throw rugs and other things on the floor that can make you trip. What can I do in the bedroom? Use night lights. Make sure that you have a light by your bed that is easy to reach. Do not use any sheets or blankets that are too big for your bed. They should not hang down onto the floor. Have a firm chair that has side arms. You can use this for support while you get dressed. Do not have throw rugs and other things on the floor that can make you trip. What can I do in the kitchen? Clean up any spills right away. Avoid walking on wet floors. Keep items that you use a lot in easy-to-reach places. If you need to reach something above you, use a strong step stool that has a grab bar. Keep electrical cords out of the way. Do not use floor polish or wax that makes floors slippery. If you must use wax, use non-skid floor wax. Do not have throw rugs and other things on the floor that can make you trip. What can I do with my stairs? Do not leave any items on the stairs.  Make sure that there are handrails on both sides of the stairs and use them. Fix handrails that are broken or loose. Make sure that handrails are as long as the stairways. Check any carpeting to make sure that it is firmly attached to the stairs. Fix any carpet that is loose or worn. Avoid having throw rugs at the top or bottom of the stairs. If you do have throw rugs, attach them to the floor with carpet tape. Make sure that you have a light switch at the top of the stairs and the bottom of the stairs. If you do not have them, ask someone to add them for you. What else can I do to help prevent falls? Wear shoes that: Do not have high heels. Have rubber bottoms. Are comfortable  and fit you well. Are closed at the toe. Do not wear sandals. If you use a stepladder: Make sure that it is fully opened. Do not climb a closed stepladder. Make sure that both sides of the stepladder are locked into place. Ask someone to hold it for you, if possible. Clearly mark and make sure that you can see: Any grab bars or handrails. First and last steps. Where the edge of each step is. Use tools that help you move around (mobility aids) if they are needed. These include: Canes. Walkers. Scooters. Crutches. Turn on the lights when you go into a dark area. Replace any light bulbs as soon as they burn out. Set up your furniture so you have a clear path. Avoid moving your furniture around. If any of your floors are uneven, fix them. If there are any pets around you, be aware of where they are. Review your medicines with your doctor. Some medicines can make you feel dizzy. This can increase your chance of falling. Ask your doctor what other things that you can do to help prevent falls. This information is not intended to replace advice given to you by your health care provider. Make sure you discuss any questions you have with your health care provider. Document Released: 06/10/2009 Document Revised: 01/20/2016 Document Reviewed: 09/18/2014 Elsevier Interactive Patient Education  2017 [employer].

## 2022-02-08 ENCOUNTER — Ambulatory Visit: Payer: Medicare Other | Admitting: Urology

## 2022-02-09 ENCOUNTER — Other Ambulatory Visit: Payer: Self-pay | Admitting: Family Medicine

## 2022-02-09 ENCOUNTER — Ambulatory Visit (INDEPENDENT_AMBULATORY_CARE_PROVIDER_SITE_OTHER): Payer: Medicare Other

## 2022-02-09 DIAGNOSIS — N1831 Chronic kidney disease, stage 3a: Secondary | ICD-10-CM

## 2022-02-09 DIAGNOSIS — E1122 Type 2 diabetes mellitus with diabetic chronic kidney disease: Secondary | ICD-10-CM | POA: Diagnosis not present

## 2022-02-09 NOTE — Progress Notes (Signed)
Went over [employer] use and application instructions. Placed Dexcom G7 sensor on patients L arm. She voices understanding on how to the device and I advised her to call office with any questions or concerns.

## 2022-02-13 ENCOUNTER — Ambulatory Visit (INDEPENDENT_AMBULATORY_CARE_PROVIDER_SITE_OTHER): Payer: Medicare Other | Admitting: Urology

## 2022-02-13 ENCOUNTER — Encounter: Payer: Self-pay | Admitting: Urology

## 2022-02-13 VITALS — BP 123/55 | HR 74

## 2022-02-13 DIAGNOSIS — R32 Unspecified urinary incontinence: Secondary | ICD-10-CM | POA: Diagnosis not present

## 2022-02-13 LAB — MICROSCOPIC EXAMINATION
Bacteria, UA: NONE SEEN
RBC, Urine: NONE SEEN /hpf (ref 0–2)

## 2022-02-13 LAB — URINALYSIS, ROUTINE W REFLEX MICROSCOPIC
Bilirubin, UA: NEGATIVE
Glucose, UA: NEGATIVE
Ketones, UA: NEGATIVE
Nitrite, UA: NEGATIVE
Protein,UA: NEGATIVE
RBC, UA: NEGATIVE
Specific Gravity, UA: 1.02 (ref 1.005–1.030)
Urobilinogen, Ur: 0.2 mg/dL (ref 0.2–1.0)
pH, UA: 6.5 (ref 5.0–7.5)

## 2022-02-13 MED ORDER — TOLTERODINE TARTRATE ER 4 MG PO CP24: 4.0000 mg | ORAL_CAPSULE | Freq: Every day | ORAL | 3 refills | Status: DC

## 2022-02-13 NOTE — Addendum Note (Signed)
Addended by: Cleon Gustin on: 02/13/2022 02:35 PM   Modules accepted: Orders

## 2022-02-13 NOTE — Patient Instructions (Signed)

## 2022-02-13 NOTE — Progress Notes (Signed)
02/13/2022 2:15 PM   ETNA FORQUER 1950-11-04 [id number]  Referring provider: Erven Colla, DO [place] [address],  [place] [address]  Followup urge incontinence   HPI: Kimberly [name] is a 71yo here for followup for urge incontinence and OAB. She is currently on detrol LA '4mg'$  daily. She has stable urinary urgency and rare urge incontinence. She has increased blood sugars and A1c is 7.8. She has nocturnal enuresis and wears pads. No other complaints today.    PMH: Past Medical History:  Diagnosis Date   Anxiety    Arthritis    Depression    Diabetes mellitus    Family history of breast cancer    Family history of colon cancer    Family history of prostate cancer    Family history of skin cancer    GERD (gastroesophageal reflux disease)    High blood pressure    Hypothyroid    Obesity    PONV (postoperative nausea and vomiting)     Surgical History: Past Surgical History:  Procedure Laterality Date   ABDOMINAL HYSTERECTOMY     [place] Right    CARPAL TUNNEL RELEASE Right    KNEE ARTHROSCOPY Right    MASTECTOMY MODIFIED RADICAL Right 07/29/2021   Procedure: MASTECTOMY MODIFIED RADICAL;  Surgeon: Aviva Signs, MD;  Location: [facility];  Service: General;  Laterality: Right;   THYROID SURGERY     removal   TOTAL KNEE ARTHROPLASTY Right 08/25/2013   Procedure: TOTAL KNEE ARTHROPLASTY;  Surgeon: Carole Civil, MD;  Location: [facility];  Service: Orthopedics;  Laterality: Right;   TOTAL KNEE ARTHROPLASTY Left 09/09/2014   Procedure: LEFT TOTAL KNEE ARTHROPLASTY;  Surgeon: Carole Civil, MD;  Location: [facility];  Service: Orthopedics;  Laterality: Left;    Home Medications:  Allergies as of 02/13/2022       Reactions   Ace Inhibitors Cough        Medication List        Accurate as of February 13, 2022  2:15 PM. If you have any questions, ask your nurse or doctor.          anastrozole 1 MG tablet Commonly known as: ARIMIDEX Take 1 tablet (1 mg total) by  mouth daily.   B-D ULTRAFINE III SHORT PEN 31G X 8 MM Misc Generic drug: Insulin Pen Needle 1 each by Does not apply route as directed. Use to inject insulin once daily   Calcium-Vitamin D 500-3.125 MG-MCG Tabs Take by mouth. Takes 2 daily   diclofenac Sodium 1 % Gel Commonly known as: VOLTAREN Apply 2 g topically daily as needed (knee pain).   famotidine 40 MG tablet Commonly known as: PEPCID TAKE 1 TABLET DAILY   gabapentin 100 MG capsule Commonly known as: NEURONTIN Take 1 capsule (100 mg total) by mouth 3 (three) times daily.   glipiZIDE 5 MG 24 hr tablet Commonly known as: GLUCOTROL XL Take 1 tablet (5 mg total) by mouth daily with breakfast.   hydrochlorothiazide 25 MG tablet Commonly known as: HYDRODIURIL TAKE 1 TABLET DAILY   levothyroxine 125 MCG tablet Commonly known as: SYNTHROID TAKE 1 TABLET ONCE DAILY   BEFORE BREAKFAST   losartan 100 MG tablet Commonly known as: COZAAR TAKE 1 TABLET EVERY MORNING   metFORMIN 500 MG 24 hr tablet Commonly known as: GLUCOPHAGE-XR Take 2 tablets (1,000 mg total) by mouth daily with breakfast.   metoprolol succinate 100 MG 24 hr tablet Commonly known as: TOPROL-XL TAKE 1 TABLET AT BEDTIME  WITH OR IMMEDIATELY        FOLLOWING A MEAL   omeprazole 20 MG capsule Commonly known as: PRILOSEC TAKE 1 CAPSULE AT BEDTIME   tolterodine 4 MG 24 hr capsule Commonly known as: DETROL LA Take 1 capsule (4 mg total) by mouth daily.   Tyler Aas FlexTouch 100 UNIT/ML FlexTouch Pen Generic drug: insulin degludec Inject 25 Units into the skin at bedtime.   venlafaxine XR 75 MG 24 hr capsule Commonly known as: EFFEXOR-XR TAKE 1 CAPSULE DAILY WITH  BREAKFAST        Allergies:  Allergies  Allergen Reactions   Ace Inhibitors Cough    Family History: Family History  Problem Relation Age of Onset   Diabetes Mother    Hypertension Mother    Congestive Heart Failure Father    Hypertension Father    Skin cancer Father 59        squamous cell on lip; metastatic   Hypertension Brother    Skin cancer Brother        squamous cell   Breast cancer Paternal Aunt        dx 24s d. 9s   Prostate cancer Paternal Uncle        dx 89s   Colon cancer Maternal Grandmother        dx 17s d. 74s   Diabetes Other    Cancer Cousin        unk type; possibly pancreatic    Social History:  reports that she has never smoked. She has never used smokeless tobacco. She reports that she does not drink alcohol and does not use drugs.  ROS: All other review of systems were reviewed and are negative except what is noted above in HPI  Physical Exam: BP (!) 123/55   Pulse 74   Constitutional:  Alert and oriented, No acute distress. HEENT: [place] AT, moist mucus membranes.  Trachea midline, no masses. Cardiovascular: No clubbing, cyanosis, or edema. Respiratory: Normal respiratory effort, no increased work of breathing. GI: Abdomen is soft, nontender, nondistended, no abdominal masses GU: No CVA tenderness.  Lymph: No cervical or inguinal lymphadenopathy. Skin: No rashes, bruises or suspicious lesions. Neurologic: Grossly intact, no focal deficits, moving all 4 extremities. Psychiatric: Normal mood and affect.  Laboratory Data: Lab Results  Component Value Date   WBC 7.9 01/09/2022   HGB 11.4 (L) 01/09/2022   HCT 33.9 (L) 01/09/2022   MCV 96.0 01/09/2022   PLT 301 01/09/2022    Lab Results  Component Value Date   CREATININE 1.03 (H) 01/09/2022    No results found for: "PSA"  No results found for: "TESTOSTERONE"  Lab Results  Component Value Date   HGBA1C 7.8 02/02/2022    Urinalysis    Component Value Date/Time   APPEARANCEUR Clear 09/10/2020 1000   GLUCOSEU Negative 09/10/2020 1000   BILIRUBINUR Negative 09/10/2020 1000   PROTEINUR 1+ (A) 09/10/2020 1000   UROBILINOGEN 0.2 05/07/2020 1000   NITRITE Negative 09/10/2020 1000   LEUKOCYTESUR Negative 09/10/2020 1000    Lab Results  Component Value Date    LABMICR 25.1 04/11/2021   WBCUA 0-5 09/10/2020   LABEPIT 0-10 09/10/2020   BACTERIA Few 09/10/2020    Pertinent Imaging:  No results found for this or any previous visit.  No results found for this or any previous visit.  No results found for this or any previous visit.  No results found for this or any previous visit.  Results for orders placed during the hospital  encounter of 07/02/18  US RENAL  Narrative CLINICAL DATA:  Chronic renal disease.  EXAM: RENAL / URINARY TRACT ULTRASOUND COMPLETE  COMPARISON:  None.  FINDINGS: Right Kidney:  Renal measurements: 11.3 x 5.0 x 5.5 cm = volume: 161 mL . Echogenicity within normal limits. No mass or hydronephrosis visualized.  Left Kidney:  Renal measurements: 11.1 x 4.4 x 5.8 cm = volume: 147 mL. Echogenicity within normal limits. No mass or hydronephrosis visualized.  Bladder:  Appears normal for degree of bladder distention.  IMPRESSION: Negative exam.   Electronically Signed By: Inge Rise M.D. On: 07/02/2018 16:13  No results found for this or any previous visit.  No results found for this or any previous visit.  No results found for this or any previous visit.   Assessment & Plan:    1. Urinary incontinence, unspecified type -Continue detrol LA '4mg'$   - Urinalysis, Routine w reflex microscopic   No follow-ups on file.  Nicolette Bang, MD  [facility] [place]

## 2022-02-22 ENCOUNTER — Ambulatory Visit (INDEPENDENT_AMBULATORY_CARE_PROVIDER_SITE_OTHER): Payer: Medicare Other | Admitting: Family Medicine

## 2022-02-22 ENCOUNTER — Encounter: Payer: Self-pay | Admitting: Family Medicine

## 2022-02-22 DIAGNOSIS — I10 Essential (primary) hypertension: Secondary | ICD-10-CM

## 2022-02-22 DIAGNOSIS — M722 Plantar fascial fibromatosis: Secondary | ICD-10-CM | POA: Diagnosis not present

## 2022-02-22 DIAGNOSIS — E781 Pure hyperglyceridemia: Secondary | ICD-10-CM | POA: Diagnosis not present

## 2022-02-22 DIAGNOSIS — N1831 Chronic kidney disease, stage 3a: Secondary | ICD-10-CM | POA: Diagnosis not present

## 2022-02-22 DIAGNOSIS — E1122 Type 2 diabetes mellitus with diabetic chronic kidney disease: Secondary | ICD-10-CM

## 2022-02-22 MED ORDER — NAPROXEN 500 MG PO TABS: 500.0000 mg | ORAL_TABLET | Freq: Two times a day (BID) | ORAL | 0 refills | Status: DC

## 2022-02-22 NOTE — Progress Notes (Signed)
Subjective:  Patient ID: Kimberly [name], female    DOB: [birth year]  Age: 71 y.o. MRN: [id number]  CC: Chief Complaint  Patient presents with   Hypertension    Pt arrives for follow up on HTN. Pt having issues with both feet, mainly left heel. Pt states very painful, when getting up at night it will be a while before she can walk; began about 3 weeks ago. Has appt with foot doctor on 03/23/22.    HPI:  71 year old female presents for follow-up  Hypertension is well controlled on HCTZ, losartan, and metoprolol.  Patient is followed closely by endocrinology.  A1c improving.  Last A1c was 7.8.  Lipids have been fairly well controlled although triglycerides elevated.  This is likely due to diabetes and is not at goal.  Patient has a history of breast cancer.  She is doing well.  Tolerating anastrozole.  Follows regularly with heme-onc.  Patient reports 3-week history of pain in the left heel.  She has been icing the area without resolution.  Worse with activity.  Seems to be worse at night as well.  Patient Active Problem List   Diagnosis Date Noted   Plantar fasciitis 02/22/2022   Genetic testing 10/25/2021   Type 2 diabetes with kidney complications ([facility]) 22/63/3354   Vitamin D deficiency 09/05/2021   Hypertriglyceridemia 09/05/2021   Macrocytic anemia 09/05/2021   Breast cancer of upper-outer quadrant of right female breast ([facility]) 08/31/2021   S/P mastectomy, right 56/25/6389   Nonalcoholic steatohepatitis ([facility]) 11/29/2020   Urinary incontinence 05/07/2020   Depression 12/29/2015   S/P knee replacement 09/02/2013   Essential hypertension, benign 07/06/2013   Hypothyroidism 07/06/2013    Social Hx   Social History   Socioeconomic History   Marital status: Married    Spouse name: Not on file   Number of children: 5   Years of education: Not on file   Highest education level: Not on file  Occupational History   Occupation: CNA    Employer: [employer]  Tobacco Use    Smoking status: Never   Smokeless tobacco: Never  Vaping Use   Vaping Use: Never used  Substance and Sexual Activity   Alcohol use: No   Drug use: No   Sexual activity: Yes    Birth control/protection: Surgical  Other Topics Concern   Not on file  Social History Narrative   Not on file   Social Determinants of Health   Financial Resource Strain: Low Risk  (02/07/2022)   Overall Financial Resource Strain (CARDIA)    Difficulty of Paying Living Expenses: Not hard at all  Food Insecurity: No Food Insecurity (02/07/2022)   Hunger Vital Sign    Worried About Running Out of Food in the Last Year: Never true    Ran Out of Food in the Last Year: Never true  Transportation Needs: No Transportation Needs (02/07/2022)   PRAPARE - Hydrologist (Medical): No    Lack of Transportation (Non-Medical): No  Physical Activity: Insufficiently Active (02/07/2022)   Exercise Vital Sign    Days of Exercise per Week: 2 days    Minutes of Exercise per Session: 60 min  Stress: No Stress Concern Present (02/07/2022)   [name]    Feeling of Stress : Only a little  Social Connections: Socially Integrated (02/07/2022)   Social Connection and Isolation Panel [NHANES]    Frequency of Communication with Friends and Family: More  than three times a week    Frequency of Social Gatherings with Friends and Family: More than three times a week    Attends Religious Services: More than 4 times per year    Active Member of Clubs or Organizations: Yes    Attends Music therapist: More than 4 times per year    Marital Status: Married    Review of Systems Per HPI  Objective:  BP 138/78   Pulse (!) 57   Temp 97.6 F (36.4 C)   Wt 194 lb 6.4 oz (88.2 kg)   SpO2 95%   BMI 35.56 kg/m      02/22/2022   11:07 AM 02/13/2022    2:10 PM 02/07/2022    3:26 PM  BP/Weight  Systolic BP 045 409   Diastolic BP  78 55   Wt. (Lbs) 194.4  196  BMI 35.56 kg/m2  35.85 kg/m2    Physical Exam Vitals and nursing note reviewed.  Constitutional:      General: She is not in acute distress.    Appearance: Normal appearance. She is obese.  HENT:     Head: Normocephalic and atraumatic.  Cardiovascular:     Rate and Rhythm: Normal rate and regular rhythm.  Pulmonary:     Effort: Pulmonary effort is normal.     Breath sounds: Normal breath sounds. No wheezing, rhonchi or rales.  Musculoskeletal:     Comments: Patient with reported tenderness on the plantar aspect of the heel.  No significant tenderness to palpation on exam.  Neurological:     Mental Status: She is alert.  Psychiatric:        Mood and Affect: Mood normal.        Behavior: Behavior normal.     Lab Results  Component Value Date   WBC 7.9 01/09/2022   HGB 11.4 (L) 01/09/2022   HCT 33.9 (L) 01/09/2022   PLT 301 01/09/2022   GLUCOSE 249 (H) 01/09/2022   CHOL 163 04/11/2021   TRIG 253 (H) 04/11/2021   HDL 42 04/11/2021   LDLCALC 79 04/11/2021   ALT 20 01/09/2022   AST 20 01/09/2022   NA 136 01/09/2022   K 4.1 01/09/2022   CL 102 01/09/2022   CREATININE 1.03 (H) 01/09/2022   BUN 26 (H) 01/09/2022   CO2 27 01/09/2022   TSH 0.240 (L) 01/27/2022   INR 1.04 09/04/2014   HGBA1C 7.8 02/02/2022   MICROALBUR 1.1 06/03/2014     Assessment & Plan:   Problem List Items Addressed This Visit       Cardiovascular and Mediastinum   Essential hypertension, benign    Stable.  Continue losartan, HCTZ, and metoprolol.        Endocrine   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)    W1X improving.  Patient to continue close follow-up with endocrinology.        Musculoskeletal and Integument   Plantar fasciitis    Advised ice, heel inserts/cups, exercises and brief use of anti-inflammatory.  Rx was sent for this.        Other   Hypertriglyceridemia    Has been relatively stable.  Needs better control of triglycerides.   This will likely improve with better control of type 2 diabetes.       Meds ordered this encounter  Medications   naproxen (NAPROSYN) 500 MG tablet    Sig: Take 1 tablet (500 mg total) by mouth 2 (two) times daily with a meal.  Dispense:  28 tablet    Refill:  0    Follow-up:  Return in about 6 months (around 08/24/2022).  [place]

## 2022-02-22 NOTE — Assessment & Plan Note (Signed)
Has been relatively stable.  Needs better control of triglycerides.  This will likely improve with better control of type 2 diabetes.

## 2022-02-22 NOTE — Assessment & Plan Note (Signed)
Advised ice, heel inserts/cups, exercises and brief use of anti-inflammatory.  Rx was sent for this.

## 2022-02-22 NOTE — Assessment & Plan Note (Signed)
Stable.  Continue losartan, HCTZ, and metoprolol.

## 2022-02-22 NOTE — Assessment & Plan Note (Signed)
A1c improving.  Patient to continue close follow-up with endocrinology.

## 2022-03-06 ENCOUNTER — Ambulatory Visit: Payer: Medicare Other | Admitting: Family Medicine

## 2022-03-23 DIAGNOSIS — E1142 Type 2 diabetes mellitus with diabetic polyneuropathy: Secondary | ICD-10-CM | POA: Diagnosis not present

## 2022-03-23 DIAGNOSIS — Z7984 Long term (current) use of oral hypoglycemic drugs: Secondary | ICD-10-CM | POA: Diagnosis not present

## 2022-03-23 DIAGNOSIS — L600 Ingrowing nail: Secondary | ICD-10-CM | POA: Diagnosis not present

## 2022-03-23 DIAGNOSIS — B351 Tinea unguium: Secondary | ICD-10-CM | POA: Diagnosis not present

## 2022-03-23 DIAGNOSIS — M7732 Calcaneal spur, left foot: Secondary | ICD-10-CM | POA: Diagnosis not present

## 2022-03-23 DIAGNOSIS — Z794 Long term (current) use of insulin: Secondary | ICD-10-CM | POA: Diagnosis not present

## 2022-03-23 DIAGNOSIS — M722 Plantar fascial fibromatosis: Secondary | ICD-10-CM | POA: Diagnosis not present

## 2022-03-23 DIAGNOSIS — L603 Nail dystrophy: Secondary | ICD-10-CM | POA: Diagnosis not present

## 2022-03-23 DIAGNOSIS — M24575 Contracture, left foot: Secondary | ICD-10-CM | POA: Diagnosis not present

## 2022-03-24 ENCOUNTER — Other Ambulatory Visit: Payer: Self-pay

## 2022-03-24 DIAGNOSIS — E039 Hypothyroidism, unspecified: Secondary | ICD-10-CM

## 2022-03-24 MED ORDER — LEVOTHYROXINE SODIUM 125 MCG PO TABS: ORAL_TABLET | ORAL | 0 refills | Status: DC

## 2022-03-30 DIAGNOSIS — Z872 Personal history of diseases of the skin and subcutaneous tissue: Secondary | ICD-10-CM | POA: Diagnosis not present

## 2022-03-30 DIAGNOSIS — L603 Nail dystrophy: Secondary | ICD-10-CM | POA: Diagnosis not present

## 2022-04-14 DIAGNOSIS — E119 Type 2 diabetes mellitus without complications: Secondary | ICD-10-CM | POA: Diagnosis not present

## 2022-04-14 DIAGNOSIS — H524 Presbyopia: Secondary | ICD-10-CM | POA: Diagnosis not present

## 2022-04-25 ENCOUNTER — Other Ambulatory Visit: Payer: Self-pay | Admitting: Family Medicine

## 2022-04-25 DIAGNOSIS — K219 Gastro-esophageal reflux disease without esophagitis: Secondary | ICD-10-CM

## 2022-04-25 DIAGNOSIS — I10 Essential (primary) hypertension: Secondary | ICD-10-CM

## 2022-04-28 ENCOUNTER — Other Ambulatory Visit: Payer: Self-pay | Admitting: Family Medicine

## 2022-05-08 ENCOUNTER — Encounter: Payer: Self-pay | Admitting: Nurse Practitioner

## 2022-05-08 ENCOUNTER — Ambulatory Visit (INDEPENDENT_AMBULATORY_CARE_PROVIDER_SITE_OTHER): Payer: Medicare Other | Admitting: Nurse Practitioner

## 2022-05-08 VITALS — BP 139/69 | HR 70 | Ht 60.0 in | Wt 190.0 lb

## 2022-05-08 DIAGNOSIS — N1831 Chronic kidney disease, stage 3a: Secondary | ICD-10-CM | POA: Diagnosis not present

## 2022-05-08 DIAGNOSIS — E1122 Type 2 diabetes mellitus with diabetic chronic kidney disease: Secondary | ICD-10-CM | POA: Diagnosis not present

## 2022-05-08 DIAGNOSIS — E782 Mixed hyperlipidemia: Secondary | ICD-10-CM | POA: Diagnosis not present

## 2022-05-08 DIAGNOSIS — I10 Essential (primary) hypertension: Secondary | ICD-10-CM

## 2022-05-08 DIAGNOSIS — E039 Hypothyroidism, unspecified: Secondary | ICD-10-CM

## 2022-05-08 MED ORDER — TRESIBA FLEXTOUCH 100 UNIT/ML ~~LOC~~ SOPN: 25.0000 [IU] | PEN_INJECTOR | Freq: Every day | SUBCUTANEOUS | 3 refills | Status: DC

## 2022-05-08 MED ORDER — GLIPIZIDE ER 5 MG PO TB24
5.0000 mg | ORAL_TABLET | Freq: Every day | ORAL | 1 refills | Status: DC
Start: 2022-05-08 — End: 2022-12-25

## 2022-05-08 NOTE — Patient Instructions (Signed)

## 2022-05-08 NOTE — Progress Notes (Signed)
Endocrinology Follow Up Note       05/08/2022, 12:50 PM   Subjective:    Patient ID: Kimberly [name], female    DOB: 15-Jun-1951.  Kimberly [name] is being seen in follow up after being seen in consultation for management of currently uncontrolled symptomatic diabetes requested by  Coral Spikes, DO.   Past Medical History:  Diagnosis Date   Anxiety    Arthritis    Depression    Diabetes mellitus    Family history of breast cancer    Family history of colon cancer    Family history of prostate cancer    Family history of skin cancer    GERD (gastroesophageal reflux disease)    High blood pressure    Hypothyroid    Obesity    PONV (postoperative nausea and vomiting)     Past Surgical History:  Procedure Laterality Date   ABDOMINAL HYSTERECTOMY     [place] Right    CARPAL TUNNEL RELEASE Right    KNEE ARTHROSCOPY Right    MASTECTOMY MODIFIED RADICAL Right 07/29/2021   Procedure: MASTECTOMY MODIFIED RADICAL;  Surgeon: Aviva Signs, MD;  Location: [facility];  Service: General;  Laterality: Right;   THYROID SURGERY     removal   TOTAL KNEE ARTHROPLASTY Right 08/25/2013   Procedure: TOTAL KNEE ARTHROPLASTY;  Surgeon: Carole Civil, MD;  Location: [facility];  Service: Orthopedics;  Laterality: Right;   TOTAL KNEE ARTHROPLASTY Left 09/09/2014   Procedure: LEFT TOTAL KNEE ARTHROPLASTY;  Surgeon: Carole Civil, MD;  Location: [facility];  Service: Orthopedics;  Laterality: Left;    Social History   Socioeconomic History   Marital status: Married    Spouse name: Not on file   Number of children: 5   Years of education: Not on file   Highest education level: Not on file  Occupational History   Occupation: CNA    Employer: [employer]  Tobacco Use   Smoking status: Never   Smokeless tobacco: Never  Vaping Use   Vaping Use: Never used  Substance and Sexual Activity   Alcohol use: No   Drug use: No    Sexual activity: Yes    Birth control/protection: Surgical  Other Topics Concern   Not on file  Social History Narrative   Not on file   Social Determinants of Health   Financial Resource Strain: Low Risk  (02/07/2022)   Overall Financial Resource Strain (CARDIA)    Difficulty of Paying Living Expenses: Not hard at all  Food Insecurity: No Food Insecurity (02/07/2022)   Hunger Vital Sign    Worried About Running Out of Food in the Last Year: Never true    Ran Out of Food in the Last Year: Never true  Transportation Needs: No Transportation Needs (02/07/2022)   PRAPARE - Hydrologist (Medical): No    Lack of Transportation (Non-Medical): No  Physical Activity: Insufficiently Active (02/07/2022)   Exercise Vital Sign    Days of Exercise per Week: 2 days    Minutes of Exercise per Session: 60 min  Stress: No Stress Concern Present (02/07/2022)   [place] -  Occupational Stress Questionnaire    Feeling of Stress : Only a little  Social Connections: Socially Integrated (02/07/2022)   Social Connection and Isolation Panel [NHANES]    Frequency of Communication with Friends and Family: More than three times a week    Frequency of Social Gatherings with Friends and Family: More than three times a week    Attends Religious Services: More than 4 times per year    Active Member of [employer] or Organizations: Yes    Attends Music therapist: More than 4 times per year    Marital Status: Married    Family History  Problem Relation Age of Onset   Diabetes Mother    Hypertension Mother    Congestive Heart Failure Father    Hypertension Father    Skin cancer Father 25       squamous cell on lip; metastatic   Hypertension Brother    Skin cancer Brother        squamous cell   Breast cancer Paternal Aunt        dx 88s d. 74s   Prostate cancer Paternal Uncle        dx 77s   Colon cancer Maternal Grandmother        dx  34s d. 36s   Diabetes Other    Cancer Cousin        unk type; possibly pancreatic    Outpatient Encounter Medications as of 05/08/2022  Medication Sig   anastrozole (ARIMIDEX) 1 MG tablet Take 1 tablet (1 mg total) by mouth daily.   Calcium-Vitamin D 500-3.125 MG-MCG TABS Take by mouth. Takes 2 daily   Continuous Blood Gluc Sensor (DEXCOM G7 SENSOR) MISC by Does not apply route.   diclofenac Sodium (VOLTAREN) 1 % GEL Apply 2 g topically daily as needed (knee pain).   famotidine (PEPCID) 40 MG tablet TAKE 1 TABLET DAILY   gabapentin (NEURONTIN) 100 MG capsule Take 1 capsule (100 mg total) by mouth 3 (three) times daily.   hydrochlorothiazide (HYDRODIURIL) 25 MG tablet TAKE 1 TABLET DAILY   Insulin Pen Needle (B-D ULTRAFINE III SHORT PEN) 31G X 8 MM MISC 1 each by Does not apply route as directed. Use to inject insulin once daily   levothyroxine (SYNTHROID) 125 MCG tablet TAKE 1 TABLET ONCE DAILY   BEFORE BREAKFAST   losartan (COZAAR) 100 MG tablet TAKE 1 TABLET EVERY MORNING   metFORMIN (GLUCOPHAGE-XR) 500 MG 24 hr tablet Take 2 tablets (1,000 mg total) by mouth daily with breakfast.   metoprolol succinate (TOPROL-XL) 100 MG 24 hr tablet TAKE 1 TABLET AT BEDTIME   WITH OR IMMEDIATELY        FOLLOWING A MEAL   naproxen (NAPROSYN) 500 MG tablet Take 1 tablet (500 mg total) by mouth 2 (two) times daily with a meal.   omeprazole (PRILOSEC) 20 MG capsule TAKE 1 CAPSULE AT BEDTIME   tolterodine (DETROL LA) 4 MG 24 hr capsule Take 1 capsule (4 mg total) by mouth daily.   venlafaxine XR (EFFEXOR-XR) 75 MG 24 hr capsule TAKE 1 CAPSULE DAILY WITH  BREAKFAST   [DISCONTINUED] glipiZIDE (GLUCOTROL XL) 5 MG 24 hr tablet Take 1 tablet (5 mg total) by mouth daily with breakfast.   [DISCONTINUED] insulin degludec (TRESIBA FLEXTOUCH) 100 UNIT/ML FlexTouch Pen Inject 25 Units into the skin at bedtime.   glipiZIDE (GLUCOTROL XL) 5 MG 24 hr tablet Take 1 tablet (5 mg total) by mouth daily with breakfast.    insulin degludec (  TRESIBA FLEXTOUCH) 100 UNIT/ML FlexTouch Pen Inject 25 Units into the skin at bedtime.   Facility-Administered Encounter Medications as of 05/08/2022  Medication   bupivacaine liposome (EXPAREL) 1.3 % injection 266 mg    ALLERGIES: Allergies  Allergen Reactions   Ace Inhibitors Cough    VACCINATION STATUS: Immunization History  Administered Date(s) Administered   Fluad Quad(high Dose 65+) 07/12/2020   Influenza,inj,Quad PF,6+ Mos 04/30/2018   Influenza-Unspecified 05/21/2016, 05/03/2017, 06/21/2019   Moderna Sars-Covid-2 Vaccination 10/07/2019, 11/05/2019, 08/02/2020   Pneumococcal Conjugate-13 03/03/2014, 05/30/2017   Pneumococcal Polysaccharide-23 05/27/2010    Diabetes She presents for her follow-up diabetic visit. She has type 2 diabetes mellitus. Onset time: Diagnosed at approx age of 34. Her disease course has been improving. There are no hypoglycemic associated symptoms. Pertinent negatives for hypoglycemia include no nervousness/anxiousness or tremors. Associated symptoms include foot paresthesias. Pertinent negatives for diabetes include no fatigue, no polydipsia, no polyuria and no weight loss. There are no hypoglycemic complications. Symptoms are stable. Diabetic complications include nephropathy and peripheral neuropathy. Risk factors for coronary artery disease include diabetes mellitus, dyslipidemia, family history, obesity, post-menopausal and hypertension. Current diabetic treatment includes oral agent (dual therapy) and insulin injections. She is compliant with treatment most of the time. Her weight is fluctuating minimally. She is following a generally healthy diet. Meal planning includes avoidance of concentrated sweets. She has had a previous visit with a dietitian. She participates in exercise three times a week. Her home blood glucose trend is decreasing steadily. Her overall blood glucose range is 140-180 mg/dl. (She presents today with her CGM and  logs showing slightly above target glycemic profile overall.  Her POCT A1c today is 7.1%, improving from last visit of 7.8%.  She is loving her CGM, is experimenting with different foods to see how her glucose reacts.  Analysis of her CGM shows TIR 66%, TAR 34%, TBR 0% with a GMI of 7.4%.  Has been doing water aerobics 3 times per week which is helping her muscle/joint aches.) An ACE inhibitor/angiotensin II receptor blocker is being taken. She sees a podiatrist (has seen one in the distant past).Eye exam is current.  Hyperlipidemia This is a chronic problem. The current episode started more than 1 year ago. The problem is uncontrolled. Recent lipid tests were reviewed and are variable. Exacerbating diseases include chronic renal disease, diabetes, hypothyroidism and obesity. Factors aggravating her hyperlipidemia include thiazides and beta blockers. Current antihyperlipidemic treatment includes diet change and exercise. The current treatment provides mild improvement of lipids. There are no compliance problems.  Risk factors for coronary artery disease include diabetes mellitus, dyslipidemia, family history, hypertension, obesity and post-menopausal.  Hypertension This is a chronic problem. The current episode started more than 1 year ago. The problem has been resolved since onset. The problem is controlled. Pertinent negatives include no palpitations. Agents associated with hypertension include thyroid hormones. Risk factors for coronary artery disease include diabetes mellitus, dyslipidemia, family history, obesity and post-menopausal state. Past treatments include angiotensin blockers, beta blockers and diuretics. The current treatment provides moderate improvement. There are no compliance problems.  Hypertensive end-organ damage includes kidney disease. Identifiable causes of hypertension include chronic renal disease and a thyroid problem.  Thyroid Problem Presents for follow-up visit. Patient reports no  anxiety, cold intolerance, constipation, depressed mood, fatigue, heat intolerance, leg swelling, palpitations, tremors, weight gain or weight loss. The symptoms have been stable. Past treatments include levothyroxine. The treatment provided significant relief. Her past medical history is significant for diabetes, hyperlipidemia and neuropathy (?  vs chronic back issues).     Review of systems  Constitutional: + Minimally fluctuating body weight,  current Body mass index is 37.11 kg/m. , + fatigue, no subjective hyperthermia, no subjective hypothermia Eyes: no blurry vision, no xerophthalmia ENT: no sore throat, no nodules palpated in throat, no dysphagia/odynophagia, no hoarseness Cardiovascular: no chest pain, no shortness of breath, no palpitations, no leg swelling Respiratory: no cough, no shortness of breath Gastrointestinal: no nausea/vomiting/diarrhea Musculoskeletal: + diffuse muscle/joint aches- improving Skin: no rashes, no hyperemia Neurological: no tremors, no numbness, no tingling, no dizziness Psychiatric: no depression, no anxiety  Objective:     BP 139/69 (BP Location: Left Arm, Patient Position: Sitting, Cuff Size: Normal)   Pulse 70   Ht 5' (1.524 m)   Wt 190 lb (86.2 kg)   BMI 37.11 kg/m   Wt Readings from Last 3 Encounters:  05/08/22 190 lb (86.2 kg)  02/22/22 194 lb 6.4 oz (88.2 kg)  02/07/22 196 lb (88.9 kg)     BP Readings from Last 3 Encounters:  05/08/22 139/69  02/22/22 138/78  02/13/22 (!) 123/55      Physical Exam- Limited  Constitutional:  Body mass index is 37.11 kg/m. , not in acute distress, normal state of mind Eyes:  EOMI, no exophthalmos Neck: Supple Cardiovascular: RRR, no murmurs, rubs, or gallops, no edema Respiratory: Adequate breathing efforts, no crackles, rales, rhonchi, or wheezing Musculoskeletal: no gross deformities, strength intact in all four extremities, no gross restriction of joint movements Skin:  no rashes, no  hyperemia Neurological: no tremor with outstretched hands   Diabetic Foot Exam - Simple   No data filed      CMP ( most recent) CMP     Component Value Date/Time   NA 136 01/09/2022 1121   NA 138 10/03/2021 1419   K 4.1 01/09/2022 1121   CL 102 01/09/2022 1121   CO2 27 01/09/2022 1121   GLUCOSE 249 (H) 01/09/2022 1121   BUN 26 (H) 01/09/2022 1121   BUN 20 10/03/2021 1419   CREATININE 1.03 (H) 01/09/2022 1121   CREATININE 1.06 08/11/2014 0701   CALCIUM 9.6 01/09/2022 1121   PROT 7.7 01/09/2022 1121   PROT 7.5 10/03/2021 1419   ALBUMIN 4.1 01/09/2022 1121   ALBUMIN 4.3 10/03/2021 1419   AST 20 01/09/2022 1121   ALT 20 01/09/2022 1121   ALKPHOS 70 01/09/2022 1121   BILITOT 0.9 01/09/2022 1121   BILITOT 0.3 10/03/2021 1419   GFRNONAA 58 (L) 01/09/2022 1121   GFRAA 65 10/11/2020 1146     Diabetic Labs (most recent): Lab Results  Component Value Date   HGBA1C 7.8 02/02/2022   HGBA1C 8.5 (A) 10/31/2021   HGBA1C 8.6 (H) 07/27/2021   MICROALBUR 1.1 06/03/2014   MICROALBUR 3.48 (H) 07/09/2013     Lipid Panel ( most recent) Lipid Panel     Component Value Date/Time   CHOL 163 04/11/2021 1042   TRIG 253 (H) 04/11/2021 1042   HDL 42 04/11/2021 1042   CHOLHDL 3.9 04/11/2021 1042   CHOLHDL 3.7 06/03/2014 0709   VLDL 40 06/03/2014 0709   LDLCALC 79 04/11/2021 1042   LABVLDL 42 (H) 04/11/2021 1042      Lab Results  Component Value Date   TSH 0.240 (L) 01/27/2022   TSH 0.133 (L) 10/03/2021   TSH 0.653 04/11/2021   TSH 1.490 04/19/2020   TSH 2.470 07/31/2019   TSH 0.528 04/22/2018   TSH 0.256 (L) 12/26/2017   TSH 1.030 07/08/2015  TSH 3.790 11/11/2014   TSH 0.177 (L) 06/03/2014   FREET4 1.31 01/27/2022   FREET4 1.65 10/03/2021           Assessment & Plan:   1) Type 2 diabetes mellitus with diabetic nephropathy, without long-term current use of insulin ([place])  She presents today with her CGM and logs showing slightly above target glycemic profile  overall.  Her POCT A1c today is 7.1%, improving from last visit of 7.8%.  She is loving her CGM, is experimenting with different foods to see how her glucose reacts.  Analysis of her CGM shows TIR 66%, TAR 34%, TBR 0% with a GMI of 7.4%.  Has been doing water aerobics 3 times per week which is helping her muscle/joint aches.  - Kimberly [name] has currently uncontrolled symptomatic type 2 DM since 71 years of age.  -Recent labs reviewed.  - I had a long discussion with her about the progressive nature of diabetes and the pathology behind its complications. -her diabetes is complicated by mild CKD, peripheral neuropathy and she remains at a high risk for more acute and chronic complications which include CAD, CVA, CKD, retinopathy, and neuropathy. These are all discussed in detail with her.  - Nutritional counseling repeated at each appointment due to patients tendency to fall back in to old habits.  - The patient admits there is a room for improvement in their diet and drink choices. -  Suggestion is made for the patient to avoid simple carbohydrates from their diet including Cakes, Sweet Desserts / Pastries, Ice Cream, Soda (diet and regular), Sweet Tea, Candies, Chips, Cookies, Sweet Pastries, Store Bought Juices, Alcohol in Excess of 1-2 drinks a day, Artificial Sweeteners, Coffee Creamer, and "Sugar-free" Products. This will help patient to have stable blood glucose profile and potentially avoid unintended weight gain.   - I encouraged the patient to switch to unprocessed or minimally processed complex starch and increased protein intake (animal or plant source), fruits, and vegetables.   - Patient is advised to stick to a routine mealtimes to eat 3 meals a day and avoid unnecessary snacks (to snack only to correct hypoglycemia).  - she declines referral to Jearld Fenton, RDN, CDE for diabetes education at this time.  - I have approached her with the following individualized plan to manage  her diabetes and patient agrees:   -She is advised to continue her Tresiba 25 units SQ nightly and Glipizide 5 mg XL daily with breakfast, and Metformin 1000 mg ER po daily with breakfast.  -she is encouraged to continue monitoring blood glucose twice daily (using her CGM), before breakfast and before bed, and to call the clinic if she has readings less than 70 or above 200 for 3 tests in a row.   She is benefiting from CGM device.  - she will be considered for incretin therapy as appropriate next visit.  - Specific targets for  A1c; LDL, HDL, and Triglycerides were discussed with the patient.  2) Blood Pressure /Hypertension:  her blood pressure is controlled to target.   she is advised to continue her current medications including HCTZ 25 mg po daily, Losartan 100 mg p.o. daily with breakfast, and Metoprolol 100 mg po daily.  3) Lipids/Hyperlipidemia:    Review of her recent lipid panel from 04/11/21 showed controlled LDL at 79 and elevated triglycerides of 253.  She prefers to stay away from statins at this time.  She is advised to avoid fried foods and butter.  Will recheck  lipid panel prior to next visit.  4)  Weight/Diet:  her Body mass index is 37.11 kg/m.  -  clearly complicating her diabetes care.   she is a candidate for weight loss. I discussed with her the fact that loss of 5 - 10% of her  current body weight will have the most impact on her diabetes management.  Exercise, and detailed carbohydrates information provided  -  detailed on discharge instructions.  5) Vitamin D Deficiency Her recent vitamin D level was 15.64 on 08/31/21- checked by her oncologist.  She was initiated on Ergocalciferol 50000 units weekly.  6) Hypothyroidism-unspecified There are no recent TFTs to review.  She is advised to continue Levothyroxine 125 mcg po daily before breakfast.  Will recheck TFTs prior to next visit.   - The correct intake of thyroid hormone (Levothyroxine, Synthroid), is on empty  stomach first thing in the morning, with water, separated by at least 30 minutes from breakfast and other medications,  and separated by more than 4 hours from calcium, iron, multivitamins, acid reflux medications (PPIs).  - This medication is a life-long medication and will be needed to correct thyroid hormone imbalances for the rest of your life.  The dose may change from time to time, based on thyroid blood work.  - It is extremely important to be consistent taking this medication, near the same time each morning.  -AVOID TAKING PRODUCTS CONTAINING BIOTIN (commonly found in Hair, Skin, Nails vitamins) AS IT INTERFERES WITH THE VALIDITY OF THYROID FUNCTION BLOOD TESTS.  6) Chronic Care/Health Maintenance: -she is on ACEI/ARB medications and is encouraged to initiate and continue to follow up with Ophthalmology, Dentist, Podiatrist at least yearly or according to recommendations, and advised to stay away from smoking. I have recommended yearly flu vaccine and pneumonia vaccine at least every 5 years; moderate intensity exercise for up to 150 minutes weekly; and sleep for at least 7 hours a day.  - she is advised to maintain close follow up with Coral Spikes, DO for primary care needs, as well as her other providers for optimal and coordinated care.       I spent 40 minutes in the care of the patient today including review of labs from [place], Lipids, Thyroid Function, Hematology (current and previous including abstractions from other facilities); face-to-face time discussing  her blood glucose readings/logs, discussing hypoglycemia and hyperglycemia episodes and symptoms, medications doses, her options of short and long term treatment based on the latest standards of care / guidelines;  discussion about incorporating lifestyle medicine;  and documenting the encounter. Risk reduction counseling performed per USPSTF guidelines to reduce obesity and cardiovascular risk factors.     Please refer to  Patient Instructions for Blood Glucose Monitoring and Insulin/Medications Dosing Guide"  in media tab for additional information. Please  also refer to " Patient Self Inventory" in the Media  tab for reviewed elements of pertinent patient history.  Kimberly [name] participated in the discussions, expressed understanding, and voiced agreement with the above plans.  All questions were answered to her satisfaction. she is encouraged to contact clinic should she have any questions or concerns prior to her return visit.     Follow up plan: - Return in about 3 months (around 08/07/2022) for Diabetes F/U with A1c in office, Bring meter and logs, Previsit labs.   Rayetta Pigg, [facility] [address] [place], [place] [address] Phone: [phone] Fax: [phone]  05/08/2022, 12:50 PM

## 2022-05-25 DIAGNOSIS — Z1211 Encounter for screening for malignant neoplasm of colon: Secondary | ICD-10-CM | POA: Diagnosis not present

## 2022-05-25 DIAGNOSIS — K219 Gastro-esophageal reflux disease without esophagitis: Secondary | ICD-10-CM | POA: Diagnosis not present

## 2022-05-25 DIAGNOSIS — K5730 Diverticulosis of large intestine without perforation or abscess without bleeding: Secondary | ICD-10-CM | POA: Diagnosis not present

## 2022-05-25 DIAGNOSIS — K5904 Chronic idiopathic constipation: Secondary | ICD-10-CM | POA: Diagnosis not present

## 2022-05-25 DIAGNOSIS — E669 Obesity, unspecified: Secondary | ICD-10-CM | POA: Diagnosis not present

## 2022-06-05 ENCOUNTER — Ambulatory Visit (HOSPITAL_COMMUNITY)
Admission: RE | Admit: 2022-06-05 | Discharge: 2022-06-05 | Disposition: A | Discharge status: Home / Self Care | Payer: Medicare Other | Source: Ambulatory Visit | Attending: Hematology | Admitting: Hematology

## 2022-06-05 ENCOUNTER — Inpatient Hospital Stay: Payer: Medicare Other | Attending: Hematology

## 2022-06-05 DIAGNOSIS — Z1231 Encounter for screening mammogram for malignant neoplasm of breast: Secondary | ICD-10-CM | POA: Insufficient documentation

## 2022-06-05 DIAGNOSIS — Z79811 Long term (current) use of aromatase inhibitors: Secondary | ICD-10-CM | POA: Insufficient documentation

## 2022-06-05 DIAGNOSIS — E559 Vitamin D deficiency, unspecified: Secondary | ICD-10-CM | POA: Insufficient documentation

## 2022-06-05 DIAGNOSIS — E669 Obesity, unspecified: Secondary | ICD-10-CM | POA: Insufficient documentation

## 2022-06-05 DIAGNOSIS — M2550 Pain in unspecified joint: Secondary | ICD-10-CM | POA: Diagnosis not present

## 2022-06-05 DIAGNOSIS — Z79890 Hormone replacement therapy (postmenopausal): Secondary | ICD-10-CM | POA: Diagnosis not present

## 2022-06-05 DIAGNOSIS — Z79899 Other long term (current) drug therapy: Secondary | ICD-10-CM | POA: Diagnosis not present

## 2022-06-05 DIAGNOSIS — R232 Flushing: Secondary | ICD-10-CM | POA: Insufficient documentation

## 2022-06-05 DIAGNOSIS — C50411 Malignant neoplasm of upper-outer quadrant of right female breast: Secondary | ICD-10-CM | POA: Diagnosis not present

## 2022-06-05 DIAGNOSIS — Z170 Estrogen receptor positive status [ER+]: Secondary | ICD-10-CM | POA: Diagnosis not present

## 2022-06-05 LAB — CBC WITH DIFFERENTIAL/PLATELET
Abs Immature Granulocytes: 0.01 10*3/uL (ref 0.00–0.07)
Basophils Absolute: 0.1 10*3/uL (ref 0.0–0.1)
Basophils Relative: 1 %
Eosinophils Absolute: 0.3 10*3/uL (ref 0.0–0.5)
Eosinophils Relative: 4 %
HCT: 35.2 % — ABNORMAL LOW (ref 36.0–46.0)
Hemoglobin: 11.9 g/dL — ABNORMAL LOW (ref 12.0–15.0)
Immature Granulocytes: 0 %
Lymphocytes Relative: 26 %
Lymphs Abs: 1.8 10*3/uL (ref 0.7–4.0)
MCH: 32.2 pg (ref 26.0–34.0)
MCHC: 33.8 g/dL (ref 30.0–36.0)
MCV: 95.4 fL (ref 80.0–100.0)
Monocytes Absolute: 0.7 10*3/uL (ref 0.1–1.0)
Monocytes Relative: 9 %
Neutro Abs: 4.3 10*3/uL (ref 1.7–7.7)
Neutrophils Relative %: 60 %
Platelets: 329 10*3/uL (ref 150–400)
RBC: 3.69 MIL/uL — ABNORMAL LOW (ref 3.87–5.11)
RDW: 12.6 % (ref 11.5–15.5)
WBC: 7.1 10*3/uL (ref 4.0–10.5)
nRBC: 0 % (ref 0.0–0.2)

## 2022-06-05 LAB — COMPREHENSIVE METABOLIC PANEL
ALT: 18 U/L (ref 0–44)
AST: 17 U/L (ref 15–41)
Albumin: 4.1 g/dL (ref 3.5–5.0)
Alkaline Phosphatase: 73 U/L (ref 38–126)
Anion gap: 8 (ref 5–15)
BUN: 20 mg/dL (ref 8–23)
CO2: 28 mmol/L (ref 22–32)
Calcium: 10 mg/dL (ref 8.9–10.3)
Chloride: 105 mmol/L (ref 98–111)
Creatinine, Ser: 0.87 mg/dL (ref 0.44–1.00)
GFR, Estimated: >60 mL/min (ref >60)
Glucose, Bld: 140 mg/dL — ABNORMAL HIGH (ref 70–99)
Potassium: 4.1 mmol/L (ref 3.5–5.1)
Sodium: 141 mmol/L (ref 135–145)
Total Bilirubin: 0.7 mg/dL (ref 0.3–1.2)
Total Protein: 7.6 g/dL (ref 6.5–8.1)

## 2022-06-05 LAB — VITAMIN D 25 HYDROXY (VIT D DEFICIENCY, FRACTURES): Vit D, 25-Hydroxy: 62.23 ng/mL (ref 30–100)

## 2022-06-06 LAB — CANCER ANTIGEN 15-3: CA 15-3: 22.1 U/mL (ref 0.0–25.0)

## 2022-06-07 ENCOUNTER — Other Ambulatory Visit (HOSPITAL_COMMUNITY): Payer: Self-pay | Admitting: Hematology

## 2022-06-07 DIAGNOSIS — R928 Other abnormal and inconclusive findings on diagnostic imaging of breast: Secondary | ICD-10-CM

## 2022-06-09 ENCOUNTER — Other Ambulatory Visit: Payer: Self-pay | Admitting: Nurse Practitioner

## 2022-06-09 DIAGNOSIS — E039 Hypothyroidism, unspecified: Secondary | ICD-10-CM

## 2022-06-13 ENCOUNTER — Encounter: Payer: Self-pay | Admitting: Hematology

## 2022-06-13 ENCOUNTER — Ambulatory Visit (HOSPITAL_COMMUNITY)
Admission: RE | Admit: 2022-06-13 | Discharge: 2022-06-13 | Disposition: A | Discharge status: Home / Self Care | Payer: Medicare Other | Source: Ambulatory Visit | Attending: Hematology | Admitting: Hematology

## 2022-06-13 ENCOUNTER — Inpatient Hospital Stay (HOSPITAL_BASED_OUTPATIENT_CLINIC_OR_DEPARTMENT_OTHER): Payer: Medicare Other | Admitting: Hematology

## 2022-06-13 VITALS — BP 148/78 | HR 81 | Temp 97.4°F | Resp 18 | Ht 63.5 in | Wt 188.5 lb

## 2022-06-13 DIAGNOSIS — Z170 Estrogen receptor positive status [ER+]: Secondary | ICD-10-CM

## 2022-06-13 DIAGNOSIS — R928 Other abnormal and inconclusive findings on diagnostic imaging of breast: Secondary | ICD-10-CM

## 2022-06-13 DIAGNOSIS — M2550 Pain in unspecified joint: Secondary | ICD-10-CM | POA: Diagnosis not present

## 2022-06-13 DIAGNOSIS — E559 Vitamin D deficiency, unspecified: Secondary | ICD-10-CM | POA: Diagnosis not present

## 2022-06-13 DIAGNOSIS — C50411 Malignant neoplasm of upper-outer quadrant of right female breast: Secondary | ICD-10-CM | POA: Diagnosis not present

## 2022-06-13 DIAGNOSIS — R232 Flushing: Secondary | ICD-10-CM | POA: Diagnosis not present

## 2022-06-13 DIAGNOSIS — E669 Obesity, unspecified: Secondary | ICD-10-CM | POA: Diagnosis not present

## 2022-06-13 DIAGNOSIS — Z79811 Long term (current) use of aromatase inhibitors: Secondary | ICD-10-CM | POA: Diagnosis not present

## 2022-06-13 NOTE — Progress Notes (Signed)
[facility] [address] [place] [address]   Patient Care Team: Coral Spikes, DO as PCP - General (Family Medicine) Derek Jack, MD as Medical Oncologist (Medical Oncology)  SUMMARY OF ONCOLOGIC HISTORY: Oncology History  Breast cancer of upper-outer quadrant of right female breast ([place])  08/31/2021 Initial Diagnosis   Breast cancer of upper-outer quadrant of right female breast [facility])    Genetic Testing   Negative genetic testing. No pathogenic variants identified on the [facility] Multi-Cancer+RNA panel. VUS in [place] called c.1022G>A identified. The report date is 10/24/2021.  The Multi-Cancer Panel + RNA offered by [facility] includes sequencing and/or deletion duplication testing of the following 84 genes: AIP, ALK, APC, ATM, AXIN2,BAP1,  BARD1, BLM, BMPR1A, BRCA1, BRCA2, BRIP1, CASR, CDC73, CDH1, CDK4, CDKN1B, CDKN1C, CDKN2A (p14ARF), CDKN2A (p16INK4a), CEBPA, CHEK2, CTNNA1, DICER1, DIS3L2, EGFR (c.2369C>T, p.Thr790Met variant only), EPCAM (Deletion/duplication testing only), FH, FLCN, GATA2, GPC3, GREM1 (Promoter region deletion/duplication testing only), HOXB13 (c.251G>A, p.Gly84Glu), HRAS, KIT, MAX, MEN1, MET, MITF (c.952G>A, p.Glu318Lys variant only), MLH1, MSH2, MSH3, MSH6, MUTYH, NBN, NF1, NF2, NTHL1, PALB2, PDGFRA, PHOX2B, PMS2, POLD1, POLE, POT1, PRKAR1A, PTCH1, PTEN, RAD50, RAD51C, RAD51D, RB1, RECQL4, RET, RUNX1, SDHAF2, SDHA (sequence changes only), SDHB, SDHC, SDHD, SMAD4, SMARCA4, SMARCB1, SMARCE1, STK11, SUFU, TERC, TERT, TMEM127, TP53, TSC1, TSC2, VHL, WRN and WT1.     CHIEF COMPLIANT: Follow-up of right breast cancer   INTERVAL HISTORY: [name] is a 71 y.o. female seen for follow-up of her right breast cancer.  She is continuing to tolerate anastrozole reasonably well.  She has occasional hot flashes which are tolerable.  Her joint pains are also more prominent since she started anastrozole but they are tolerable.  No new pains  reported.  REVIEW OF SYSTEMS:   Review of Systems  Constitutional:  Negative for appetite change and fatigue.  Endocrine: Positive for hot flashes (improving).  Musculoskeletal:  Positive for arthralgias (stable).  All other systems reviewed and are negative.   I have reviewed the past medical history, past surgical history, social history and family history with the patient and they are unchanged from previous note.   ALLERGIES:   is allergic to ace inhibitors.   MEDICATIONS:  Current Outpatient Medications  Medication Sig Dispense Refill   anastrozole (ARIMIDEX) 1 MG tablet Take 1 tablet (1 mg total) by mouth daily. 90 tablet 3   Calcium-Vitamin D 500-3.125 MG-MCG TABS Take by mouth. Takes 2 daily     Continuous Blood Gluc Sensor (DEXCOM G7 SENSOR) MISC by Does not apply route.     diclofenac Sodium (VOLTAREN) 1 % GEL Apply 2 g topically daily as needed (knee pain).     famotidine (PEPCID) 40 MG tablet TAKE 1 TABLET DAILY 90 tablet 0   gabapentin (NEURONTIN) 100 MG capsule Take 1 capsule (100 mg total) by mouth 3 (three) times daily. 270 capsule 1   glipiZIDE (GLUCOTROL XL) 5 MG 24 hr tablet Take 1 tablet (5 mg total) by mouth daily with breakfast. 90 tablet 1   hydrochlorothiazide (HYDRODIURIL) 25 MG tablet TAKE 1 TABLET DAILY 90 tablet 0   insulin degludec (TRESIBA FLEXTOUCH) 100 UNIT/ML FlexTouch Pen Inject 25 Units into the skin at bedtime. 25 mL 3   Insulin Pen Needle (B-D ULTRAFINE III SHORT PEN) 31G X 8 MM MISC 1 each by Does not apply route as directed. Use to inject insulin once daily 100 each 3   levothyroxine (SYNTHROID) 125 MCG tablet TAKE 1 TABLET ONCE DAILY  BEFORE BREAKFAST 90 tablet 0   losartan (COZAAR) 100 MG tablet TAKE 1 TABLET EVERY MORNING 90 tablet 0   metFORMIN (GLUCOPHAGE-XR) 500 MG 24 hr tablet Take 2 tablets (1,000 mg total) by mouth daily with breakfast. 180 tablet 3   metoprolol succinate (TOPROL-XL) 100 MG 24 hr tablet TAKE 1 TABLET AT BEDTIME   WITH  OR IMMEDIATELY        FOLLOWING A MEAL 90 tablet 0   naproxen (NAPROSYN) 500 MG tablet Take 1 tablet (500 mg total) by mouth 2 (two) times daily with a meal. 28 tablet 0   omeprazole (PRILOSEC) 20 MG capsule TAKE 1 CAPSULE AT BEDTIME 90 capsule 0   tolterodine (DETROL LA) 4 MG 24 hr capsule Take 1 capsule (4 mg total) by mouth daily. 90 capsule 3   venlafaxine XR (EFFEXOR-XR) 75 MG 24 hr capsule TAKE 1 CAPSULE DAILY WITH  BREAKFAST 90 capsule 1   No current facility-administered medications for this visit.   Facility-Administered Medications Ordered in Other Visits  Medication Dose Route Frequency Provider Last Rate Last Admin   bupivacaine liposome (EXPAREL) 1.3 % injection 266 mg  20 mL Infiltration Once Carole Civil, MD         PHYSICAL EXAMINATION: Performance status (ECOG): 1 - Symptomatic but completely ambulatory  Vitals:   06/13/22 1428  BP: (!) 148/78  Pulse: 81  Resp: 18  Temp: (!) 97.4 F (36.3 C)  SpO2: 96%   Wt Readings from Last 3 Encounters:  06/13/22 188 lb 8 oz (85.5 kg)  05/08/22 190 lb (86.2 kg)  02/22/22 194 lb 6.4 oz (88.2 kg)   Physical Exam Vitals reviewed.  Constitutional:      Appearance: Normal appearance. She is obese.  Cardiovascular:     Rate and Rhythm: Normal rate and regular rhythm.     Pulses: Normal pulses.     Heart sounds: Normal heart sounds.  Pulmonary:     Effort: Pulmonary effort is normal.     Breath sounds: Normal breath sounds.  Chest:  Breasts:    Right: No swelling, bleeding, mass, skin change (mastectomy site WNL) or tenderness.     Left: Normal. No swelling, bleeding, inverted nipple, mass, nipple discharge, skin change or tenderness.  Abdominal:     Palpations: Abdomen is soft. There is no hepatomegaly or mass.     Tenderness: There is no abdominal tenderness.  Lymphadenopathy:     Upper Body:     Right upper body: No supraclavicular or axillary adenopathy.     Left upper body: No supraclavicular or axillary  adenopathy.     Lower Body: No right inguinal adenopathy. No left inguinal adenopathy.  Neurological:     General: No focal deficit present.     Mental Status: She is alert and oriented to person, place, and time.  Psychiatric:        Mood and Affect: Mood normal.        Behavior: Behavior normal.     Breast Exam Chaperone: Thana Ates     LABORATORY DATA:  I have reviewed the data as listed    Latest Ref Rng & Units 06/05/2022    9:40 AM 01/09/2022   11:21 AM 10/03/2021    2:19 PM  CMP  Glucose 70 - 99 mg/dL 140  249  235   BUN 8 - 23 mg/dL 20  26  20    Creatinine 0.44 - 1.00 mg/dL 0.87  1.03  0.98   Sodium 135 -  145 mmol/L 141  136  138   Potassium 3.5 - 5.1 mmol/L 4.1  4.1  3.8   Chloride 98 - 111 mmol/L 105  102  100   CO2 22 - 32 mmol/L 28  27  23    Calcium 8.9 - 10.3 mg/dL 10.0  9.6  10.1   Total Protein 6.5 - 8.1 g/dL 7.6  7.7  7.5   Total Bilirubin 0.3 - 1.2 mg/dL 0.7  0.9  0.3   Alkaline Phos 38 - 126 U/L 73  70  96   AST 15 - 41 U/L 17  20  25    ALT 0 - 44 U/L 18  20  30     Lab Results  Component Value Date   CAN153 22.1 06/05/2022   Lab Results  Component Value Date   WBC 7.1 06/05/2022   HGB 11.9 (L) 06/05/2022   HCT 35.2 (L) 06/05/2022   MCV 95.4 06/05/2022   PLT 329 06/05/2022   NEUTROABS 4.3 06/05/2022    ASSESSMENT:  Stage IIa (T1CN1) right breast upper outer quadrant IDC: - Abnormal mammogram on 06/02/2021. - Right breast 9:00 mass biopsy on 06/30/2021, invasive ductal carcinoma, ER 70% positive, PR negative, Ki-67 2%, HER2 1+.  Right axillary lymph node biopsy was positive for metastatic carcinoma. - She met with Dr. Marla Roe for possible reconstruction but decided against it. - She underwent right breast mastectomy with lymph node biopsy on 07/29/2021. - Pathology showed 1.1 cm grade 2 IDC, associated DCIS, margins negative.  Metastatic carcinoma involving 1/9 lymph nodes.  PT1CPN1A. - Oncotype DX recurrence score 22.  Anastrozole started in  February 2023.    Social/family history: - She lives at home with her husband.  Today she is seen with her daughter. 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  Non-smoker. - Father had squamous cell carcinoma of the lip which has metastasized.  Maternal grandmother had colon cancer.  Paternal uncle had prostate cancer and paternal aunt had breast cancer.     PLAN:  Stage IIa (T1CN1) right breast IDC, ER positive, PR/HER2 negative: - She is tolerating anastrozole reasonably well.  Hot flashes are minor and arthralgias are more prominent since she started anastrozole. - Labs today showed LFTs are normal. - Left breast mammogram (06/13/2022): BI-RADS Category 1. - Continue anastrozole.  Follow-up in 6 months.  2.  Bone health (DEXA scan 09/12/2021 T score -0.5): - Vitamin D 62. - Continue vitamin D 2000 units daily.   Breast Cancer therapy associated bone loss: I have recommended calcium, Vitamin D and weight bearing exercises.  Orders placed this encounter:  Orders Placed This Encounter  Procedures   CBC with Differential/Platelet   Comprehensive metabolic panel   VITAMIN D 25 Hydroxy (Vit-D Deficiency, Fractures)   Cancer antigen 15-3    The patient has a good understanding of the overall plan. She agrees with it. She will call with any problems that may develop before the next visit here.  Derek Jack, MD [place] [phone]

## 2022-06-13 NOTE — Patient Instructions (Signed)
[facility]  Discharge Instructions  You were seen and examined today by Dr. Delton Coombes.  Dr. Delton Coombes discussed your most recent lab work which revealed that everything looks good.   Follow-up as scheduled in 6 months with labs.    Thank you for choosing [facility] to provide your oncology and hematology care.   To afford each patient quality time with our provider, please arrive at least 15 minutes before your scheduled appointment time. You may need to reschedule your appointment if you arrive late (10 or more minutes). Arriving late affects you and other patients whose appointments are after yours.  Also, if you miss three or more appointments without notifying the office, you may be dismissed from the clinic at the provider's discretion.    Again, thank you for choosing [facility].  Our hope is that these requests will decrease the amount of time that you wait before being seen by our physicians.   If you have a lab appointment with the [facility] please come in thru the Main Entrance and check in at the main information desk.           _____________________________________________________________  Should you have questions after your visit to [facility], please contact our office at [phone] and follow the prompts.  Our office hours are 8:00 a.m. to 4:30 p.m. Monday - Thursday and 8:00 a.m. to 2:30 p.m. Friday.  Please note that voicemails left after 4:00 p.m. may not be returned until the following business day.  We are closed weekends and all major holidays.  You do have access to a nurse 24-7, just call the main number to the clinic [phone] and do not press any options, hold on the line and a nurse will answer the phone.    For prescription refill requests, have your pharmacy contact our office and allow 72 hours.    Masks are optional in the cancer centers. If you would like for  your care team to wear a mask while they are taking care of you, please let them know. You may have one support person who is at least 70 years old accompany you for your appointments.

## 2022-06-28 ENCOUNTER — Other Ambulatory Visit: Payer: Self-pay | Admitting: Family Medicine

## 2022-07-12 ENCOUNTER — Other Ambulatory Visit: Payer: Self-pay | Admitting: Family Medicine

## 2022-07-12 DIAGNOSIS — I10 Essential (primary) hypertension: Secondary | ICD-10-CM

## 2022-07-12 DIAGNOSIS — K219 Gastro-esophageal reflux disease without esophagitis: Secondary | ICD-10-CM

## 2022-07-18 ENCOUNTER — Other Ambulatory Visit: Payer: Self-pay | Admitting: Family Medicine

## 2022-07-25 ENCOUNTER — Other Ambulatory Visit (HOSPITAL_COMMUNITY): Payer: Self-pay | Admitting: Hematology

## 2022-07-31 DIAGNOSIS — E039 Hypothyroidism, unspecified: Secondary | ICD-10-CM | POA: Diagnosis not present

## 2022-07-31 DIAGNOSIS — N1831 Chronic kidney disease, stage 3a: Secondary | ICD-10-CM | POA: Diagnosis not present

## 2022-07-31 DIAGNOSIS — E1122 Type 2 diabetes mellitus with diabetic chronic kidney disease: Secondary | ICD-10-CM | POA: Diagnosis not present

## 2022-08-01 LAB — LIPID PANEL
Chol/HDL Ratio: 4.1 ratio (ref 0.0–4.4)
Cholesterol, Total: 158 mg/dL (ref 100–199)
HDL: 39 mg/dL — ABNORMAL LOW (ref >39)
LDL Chol Calc (NIH): 88 mg/dL (ref 0–99)
Triglycerides: 180 mg/dL — ABNORMAL HIGH (ref 0–149)
VLDL Cholesterol Cal: 31 mg/dL (ref 5–40)

## 2022-08-01 LAB — TSH: TSH: 0.414 u[IU]/mL — ABNORMAL LOW (ref 0.450–4.500)

## 2022-08-01 LAB — T4, FREE: Free T4: 1.33 ng/dL (ref 0.82–1.77)

## 2022-08-09 ENCOUNTER — Ambulatory Visit (INDEPENDENT_AMBULATORY_CARE_PROVIDER_SITE_OTHER): Payer: Medicare Other | Admitting: Nurse Practitioner

## 2022-08-09 ENCOUNTER — Encounter: Payer: Self-pay | Admitting: Nurse Practitioner

## 2022-08-09 VITALS — BP 123/77 | HR 61 | Ht 63.5 in | Wt 191.0 lb

## 2022-08-09 DIAGNOSIS — E1122 Type 2 diabetes mellitus with diabetic chronic kidney disease: Secondary | ICD-10-CM | POA: Diagnosis not present

## 2022-08-09 DIAGNOSIS — N1831 Chronic kidney disease, stage 3a: Secondary | ICD-10-CM | POA: Diagnosis not present

## 2022-08-09 DIAGNOSIS — E039 Hypothyroidism, unspecified: Secondary | ICD-10-CM

## 2022-08-09 DIAGNOSIS — E782 Mixed hyperlipidemia: Secondary | ICD-10-CM

## 2022-08-09 LAB — POCT UA - MICROALBUMIN
Creatinine, POC: 50 mg/dL
Microalbumin Ur, POC: 10 mg/L

## 2022-08-09 LAB — POCT GLYCOSYLATED HEMOGLOBIN (HGB A1C): Hemoglobin A1C: 6.9 % — AB (ref 4.0–5.6)

## 2022-08-09 NOTE — Progress Notes (Signed)
Endocrinology Follow Up Note       08/09/2022, 11:07 AM   Subjective:    Patient ID: Kimberly [name], female    DOB: [birth year].  Kimberly [name] is being seen in follow up after being seen in consultation for management of currently uncontrolled symptomatic diabetes requested by  Coral Spikes, DO.   Past Medical History:  Diagnosis Date   Anxiety    Arthritis    Depression    Diabetes mellitus    Family history of breast cancer    Family history of colon cancer    Family history of prostate cancer    Family history of skin cancer    GERD (gastroesophageal reflux disease)    High blood pressure    Hypothyroid    Obesity    PONV (postoperative nausea and vomiting)     Past Surgical History:  Procedure Laterality Date   ABDOMINAL HYSTERECTOMY     Bunions Right    CARPAL TUNNEL RELEASE Right    KNEE ARTHROSCOPY Right    MASTECTOMY MODIFIED RADICAL Right 07/29/2021   Procedure: MASTECTOMY MODIFIED RADICAL;  Surgeon: Aviva Signs, MD;  Location: [facility];  Service: General;  Laterality: Right;   THYROID SURGERY     removal   TOTAL KNEE ARTHROPLASTY Right 08/25/2013   Procedure: TOTAL KNEE ARTHROPLASTY;  Surgeon: Carole Civil, MD;  Location: [facility];  Service: Orthopedics;  Laterality: Right;   TOTAL KNEE ARTHROPLASTY Left 09/09/2014   Procedure: LEFT TOTAL KNEE ARTHROPLASTY;  Surgeon: Carole Civil, MD;  Location: [facility];  Service: Orthopedics;  Laterality: Left;    Social History   Socioeconomic History   Marital status: Married    Spouse name: Not on file   Number of children: 5   Years of education: Not on file   Highest education level: Not on file  Occupational History   Occupation: CNA    Employer: [employer]  Tobacco Use   Smoking status: Never   Smokeless tobacco: Never  Vaping Use   Vaping Use: Never used  Substance and Sexual Activity   Alcohol use: No   Drug use: No    Sexual activity: Yes    Birth control/protection: Surgical  Other Topics Concern   Not on file  Social History Narrative   Not on file   Social Determinants of Health   Financial Resource Strain: Low Risk  (02/07/2022)   Overall Financial Resource Strain (CARDIA)    Difficulty of Paying Living Expenses: Not hard at all  Food Insecurity: No Food Insecurity (02/07/2022)   Hunger Vital Sign    Worried About Running Out of Food in the Last Year: Never true    Ran Out of Food in the Last Year: Never true  Transportation Needs: No Transportation Needs (02/07/2022)   PRAPARE - Hydrologist (Medical): No    Lack of Transportation (Non-Medical): No  Physical Activity: Insufficiently Active (02/07/2022)   Exercise Vital Sign    Days of Exercise per Week: 2 days    Minutes of Exercise per Session: 60 min  Stress: No Stress Concern Present (02/07/2022)   [place] -  Occupational Stress Questionnaire    Feeling of Stress : Only a little  Social Connections: Socially Integrated (02/07/2022)   Social Connection and Isolation Panel [NHANES]    Frequency of Communication with Friends and Family: More than three times a week    Frequency of Social Gatherings with Friends and Family: More than three times a week    Attends Religious Services: More than 4 times per year    Active Member of [employer] or Organizations: Yes    Attends Music therapist: More than 4 times per year    Marital Status: Married    Family History  Problem Relation Age of Onset   Diabetes Mother    Hypertension Mother    Congestive Heart Failure Father    Hypertension Father    Skin cancer Father 36       squamous cell on lip; metastatic   Hypertension Brother    Skin cancer Brother        squamous cell   Breast cancer Paternal Aunt        dx 51s d. 59s   Prostate cancer Paternal Uncle        dx 63s   Colon cancer Maternal Grandmother        dx  25s d. 85s   Diabetes Other    Cancer Cousin        unk type; possibly pancreatic    Outpatient Encounter Medications as of 08/09/2022  Medication Sig   anastrozole (ARIMIDEX) 1 MG tablet TAKE 1 TABLET DAILY   Calcium-Vitamin D 500-3.125 MG-MCG TABS Take by mouth. Takes 2 daily   Continuous Blood Gluc Sensor (DEXCOM G7 SENSOR) MISC by Does not apply route.   diclofenac Sodium (VOLTAREN) 1 % GEL Apply 2 g topically daily as needed (knee pain).   famotidine (PEPCID) 40 MG tablet TAKE 1 TABLET DAILY   gabapentin (NEURONTIN) 100 MG capsule Take 1 capsule (100 mg total) by mouth 3 (three) times daily.   glipiZIDE (GLUCOTROL XL) 5 MG 24 hr tablet Take 1 tablet (5 mg total) by mouth daily with breakfast.   hydrochlorothiazide (HYDRODIURIL) 25 MG tablet TAKE 1 TABLET DAILY   insulin degludec (TRESIBA FLEXTOUCH) 100 UNIT/ML FlexTouch Pen Inject 25 Units into the skin at bedtime.   Insulin Pen Needle (B-D ULTRAFINE III SHORT PEN) 31G X 8 MM MISC 1 each by Does not apply route as directed. Use to inject insulin once daily   levothyroxine (SYNTHROID) 125 MCG tablet TAKE 1 TABLET ONCE DAILY   BEFORE BREAKFAST   losartan (COZAAR) 100 MG tablet TAKE 1 TABLET EVERY MORNING   metFORMIN (GLUCOPHAGE-XR) 500 MG 24 hr tablet Take 2 tablets (1,000 mg total) by mouth daily with breakfast.   metoprolol succinate (TOPROL-XL) 100 MG 24 hr tablet TAKE 1 TABLET AT BEDTIME   WITH OR IMMEDIATELY        FOLLOWING A MEAL   naproxen (NAPROSYN) 500 MG tablet Take 1 tablet (500 mg total) by mouth 2 (two) times daily with a meal.   omeprazole (PRILOSEC) 20 MG capsule TAKE 1 CAPSULE AT BEDTIME   tolterodine (DETROL LA) 4 MG 24 hr capsule Take 1 capsule (4 mg total) by mouth daily.   venlafaxine XR (EFFEXOR-XR) 75 MG 24 hr capsule TAKE 1 CAPSULE BY MOUTH ONCE DAILY WITH BREAKFAST   Facility-Administered Encounter Medications as of 08/09/2022  Medication   bupivacaine liposome (EXPAREL) 1.3 % injection 266 mg     ALLERGIES: Allergies  Allergen Reactions  Ace Inhibitors Cough    VACCINATION STATUS: Immunization History  Administered Date(s) Administered   Fluad Quad(high Dose 65+) 07/12/2020   Influenza,inj,Quad PF,6+ Mos 04/30/2018   Influenza-Unspecified 05/21/2016, 05/03/2017, 06/21/2019   Moderna Sars-Covid-2 Vaccination 10/07/2019, 11/05/2019, 08/02/2020   Pneumococcal Conjugate-13 03/03/2014, 05/30/2017   Pneumococcal Polysaccharide-23 05/27/2010    Diabetes She presents for her follow-up diabetic visit. She has type 2 diabetes mellitus. Onset time: Diagnosed at approx age of 82. Her disease course has been improving. There are no hypoglycemic associated symptoms. Pertinent negatives for hypoglycemia include no nervousness/anxiousness or tremors. Associated symptoms include foot paresthesias. Pertinent negatives for diabetes include no fatigue, no polydipsia, no polyuria and no weight loss. There are no hypoglycemic complications. Symptoms are stable. Diabetic complications include nephropathy and peripheral neuropathy. Risk factors for coronary artery disease include diabetes mellitus, dyslipidemia, family history, obesity, post-menopausal and hypertension. Current diabetic treatment includes oral agent (dual therapy) and insulin injections. She is compliant with treatment most of the time. Her weight is fluctuating minimally. She is following a generally healthy diet. Meal planning includes avoidance of concentrated sweets. She has had a previous visit with a dietitian. She participates in exercise three times a week. Her home blood glucose trend is decreasing steadily. Her overall blood glucose range is 140-180 mg/dl. (She presents today with her CGM showing at target glycemic profile overall.  Her POCT A1c today is 6.9%, improving from last visit of 7.1%.  Analysis of her CGM shows TIR 74%, TAR 26%, TBR <1% with a GMI of 7.2%.  She does have spikes in glucose on occasion from over treating a  low reading (usually occurring right after her exercise routine).  She still goes to water aerobics.) An ACE inhibitor/angiotensin II receptor blocker is being taken. She sees a podiatrist (has seen one in the distant past).Eye exam is current.  Hyperlipidemia This is a chronic problem. The current episode started more than 1 year ago. The problem is uncontrolled. Recent lipid tests were reviewed and are variable. Exacerbating diseases include chronic renal disease, diabetes, hypothyroidism and obesity. Factors aggravating her hyperlipidemia include thiazides and beta blockers. Current antihyperlipidemic treatment includes diet change and exercise. The current treatment provides mild improvement of lipids. There are no compliance problems.  Risk factors for coronary artery disease include diabetes mellitus, dyslipidemia, family history, hypertension, obesity and post-menopausal.  Hypertension This is a chronic problem. The current episode started more than 1 year ago. The problem has been resolved since onset. The problem is controlled. Pertinent negatives include no palpitations. Agents associated with hypertension include thyroid hormones. Risk factors for coronary artery disease include diabetes mellitus, dyslipidemia, family history, obesity and post-menopausal state. Past treatments include angiotensin blockers, beta blockers and diuretics. The current treatment provides moderate improvement. There are no compliance problems.  Hypertensive end-organ damage includes kidney disease. Identifiable causes of hypertension include chronic renal disease and a thyroid problem.  Thyroid Problem Presents for follow-up visit. Patient reports no anxiety, cold intolerance, constipation, depressed mood, fatigue, heat intolerance, leg swelling, palpitations, tremors, weight gain or weight loss. The symptoms have been stable. Past treatments include levothyroxine. The treatment provided significant relief. Her past  medical history is significant for diabetes, hyperlipidemia and neuropathy (? vs chronic back issues).     Review of systems  Constitutional: + Minimally fluctuating body weight,  current Body mass index is 33.3 kg/m. , + fatigue, no subjective hyperthermia, no subjective hypothermia Eyes: no blurry vision, no xerophthalmia ENT: no sore throat, no nodules palpated in throat,  no dysphagia/odynophagia, no hoarseness Cardiovascular: no chest pain, no shortness of breath, no palpitations, no leg swelling Respiratory: no cough, no shortness of breath Gastrointestinal: no nausea/vomiting/diarrhea Musculoskeletal: + diffuse muscle/joint aches- improving Skin: no rashes, no hyperemia Neurological: no tremors, no numbness, no tingling, no dizziness Psychiatric: no depression, no anxiety  Objective:     BP 123/77 (BP Location: Left Arm, Patient Position: Sitting, Cuff Size: Large)   Pulse 61   Ht 5' 3.5" (1.613 m)   Wt 191 lb (86.6 kg)   BMI 33.30 kg/m   Wt Readings from Last 3 Encounters:  08/09/22 191 lb (86.6 kg)  06/13/22 188 lb 8 oz (85.5 kg)  05/08/22 190 lb (86.2 kg)     BP Readings from Last 3 Encounters:  08/09/22 123/77  06/13/22 (!) 148/78  05/08/22 139/69      Physical Exam- Limited  Constitutional:  Body mass index is 33.3 kg/m. , not in acute distress, normal state of mind Eyes:  EOMI, no exophthalmos Musculoskeletal: no gross deformities, strength intact in all four extremities, no gross restriction of joint movements Skin:  no rashes, no hyperemia Neurological: no tremor with outstretched hands   Diabetic Foot Exam - Simple   No data filed      CMP ( most recent) CMP     Component Value Date/Time   NA 141 06/05/2022 0940   NA 138 10/03/2021 1419   K 4.1 06/05/2022 0940   CL 105 06/05/2022 0940   CO2 28 06/05/2022 0940   GLUCOSE 140 (H) 06/05/2022 0940   BUN 20 06/05/2022 0940   BUN 20 10/03/2021 1419   CREATININE 0.87 06/05/2022 0940    CREATININE 1.06 08/11/2014 0701   CALCIUM 10.0 06/05/2022 0940   PROT 7.6 06/05/2022 0940   PROT 7.5 10/03/2021 1419   ALBUMIN 4.1 06/05/2022 0940   ALBUMIN 4.3 10/03/2021 1419   AST 17 06/05/2022 0940   ALT 18 06/05/2022 0940   ALKPHOS 73 06/05/2022 0940   BILITOT 0.7 06/05/2022 0940   BILITOT 0.3 10/03/2021 1419   GFRNONAA >60 06/05/2022 0940   GFRAA 65 10/11/2020 1146     Diabetic Labs (most recent): Lab Results  Component Value Date   HGBA1C 6.9 (A) 08/09/2022   HGBA1C 7.8 02/02/2022   HGBA1C 8.5 (A) 10/31/2021   MICROALBUR 10 mg/L 08/09/2022   MICROALBUR 1.1 06/03/2014   MICROALBUR 3.48 (H) 07/09/2013     Lipid Panel ( most recent) Lipid Panel     Component Value Date/Time   CHOL 158 07/31/2022 0816   TRIG 180 (H) 07/31/2022 0816   HDL 39 (L) 07/31/2022 0816   CHOLHDL 4.1 07/31/2022 0816   CHOLHDL 3.7 06/03/2014 0709   VLDL 40 06/03/2014 0709   LDLCALC 88 07/31/2022 0816   LABVLDL 31 07/31/2022 0816      Lab Results  Component Value Date   TSH 0.414 (L) 07/31/2022   TSH 0.240 (L) 01/27/2022   TSH 0.133 (L) 10/03/2021   TSH 0.653 04/11/2021   TSH 1.490 04/19/2020   TSH 2.470 07/31/2019   TSH 0.528 04/22/2018   TSH 0.256 (L) 12/26/2017   TSH 1.030 07/08/2015   TSH 3.790 11/11/2014   FREET4 1.33 07/31/2022   FREET4 1.31 01/27/2022   FREET4 1.65 10/03/2021           Assessment & Plan:   1) Type 2 diabetes mellitus with diabetic nephropathy, without long-term current use of insulin ([place])  She presents today with her CGM showing at target glycemic  profile overall.  Her POCT A1c today is 6.9%, improving from last visit of 7.1%.  Analysis of her CGM shows TIR 74%, TAR 26%, TBR <1% with a GMI of 7.2%.  She does have spikes in glucose on occasion from over treating a low reading (usually occurring right after her exercise routine).  She still goes to water aerobics.  - Kimberly [name] has currently uncontrolled symptomatic type 2 DM since 71 years of  age.  -Recent labs reviewed.  - I had a long discussion with her about the progressive nature of diabetes and the pathology behind its complications. -her diabetes is complicated by mild CKD, peripheral neuropathy and she remains at a high risk for more acute and chronic complications which include CAD, CVA, CKD, retinopathy, and neuropathy. These are all discussed in detail with her.  - Nutritional counseling repeated at each appointment due to patients tendency to fall back in to old habits.  - The patient admits there is a room for improvement in their diet and drink choices. -  Suggestion is made for the patient to avoid simple carbohydrates from their diet including Cakes, Sweet Desserts / Pastries, Ice Cream, Soda (diet and regular), Sweet Tea, Candies, Chips, Cookies, Sweet Pastries, Store Bought Juices, Alcohol in Excess of 1-2 drinks a day, Artificial Sweeteners, Coffee Creamer, and "Sugar-free" Products. This will help patient to have stable blood glucose profile and potentially avoid unintended weight gain.   - I encouraged the patient to switch to unprocessed or minimally processed complex starch and increased protein intake (animal or plant source), fruits, and vegetables.   - Patient is advised to stick to a routine mealtimes to eat 3 meals a day and avoid unnecessary snacks (to snack only to correct hypoglycemia).  - she declines referral to Jearld Fenton, RDN, CDE for diabetes education at this time.  - I have approached her with the following individualized plan to manage her diabetes and patient agrees:   -She is advised to continue her Tresiba 25 units SQ nightly, Metformin 500 mg ER po twice daily with meals and Glipizide 5 mg XL daily with breakfast for now.  She can try stopping her Glipizide after the holidays and see how she does, see if it helps her avoid those lows on exercise days.  -she is encouraged to continue monitoring blood glucose twice daily (using her CGM),  before breakfast and before bed, and to call the clinic if she has readings less than 70 or above 200 for 3 tests in a row.   She is benefiting from CGM device.  - she will be considered for incretin therapy as appropriate next visit.  - Specific targets for  A1c; LDL, HDL, and Triglycerides were discussed with the patient.  2) Blood Pressure /Hypertension:  her blood pressure is controlled to target.   she is advised to continue her current medications including HCTZ 25 mg po daily, Losartan 100 mg p.o. daily with breakfast, and Metoprolol 100 mg po daily.  3) Lipids/Hyperlipidemia:    Review of her recent lipid panel from 07/31/22 showed controlled LDL at 88 and elevated triglycerides of 180 (improved).  She prefers to stay away from statins at this time.  She is advised to avoid fried foods and butter.    4)  Weight/Diet:  her Body mass index is 33.3 kg/m.  -  clearly complicating her diabetes care.   she is a candidate for weight loss. I discussed with her the fact that loss of 5 -  10% of her  current body weight will have the most impact on her diabetes management.  Exercise, and detailed carbohydrates information provided  -  detailed on discharge instructions.  5) Vitamin D Deficiency Her recent vitamin D level was 15.64 on 08/31/21- checked by her oncologist.  She was initiated on Ergocalciferol 50000 units weekly.  6) Hypothyroidism-unspecified Her previsit thyroid function tests are consistent with appropriate hormone replacement.  She is advised to continue Levothyroxine 125 mcg po daily before breakfast.     - The correct intake of thyroid hormone (Levothyroxine, Synthroid), is on empty stomach first thing in the morning, with water, separated by at least 30 minutes from breakfast and other medications,  and separated by more than 4 hours from calcium, iron, multivitamins, acid reflux medications (PPIs).  - This medication is a life-long medication and will be needed to correct  thyroid hormone imbalances for the rest of your life.  The dose may change from time to time, based on thyroid blood work.  - It is extremely important to be consistent taking this medication, near the same time each morning.  -AVOID TAKING PRODUCTS CONTAINING BIOTIN (commonly found in Hair, Skin, Nails vitamins) AS IT INTERFERES WITH THE VALIDITY OF THYROID FUNCTION BLOOD TESTS.  6) Chronic Care/Health Maintenance: -she is on ACEI/ARB medications and is encouraged to initiate and continue to follow up with Ophthalmology, Dentist, Podiatrist at least yearly or according to recommendations, and advised to stay away from smoking. I have recommended yearly flu vaccine and pneumonia vaccine at least every 5 years; moderate intensity exercise for up to 150 minutes weekly; and sleep for at least 7 hours a day.  - she is advised to maintain close follow up with Coral Spikes, DO for primary care needs, as well as her other providers for optimal and coordinated care.      I spent 34 minutes in the care of the patient today including review of labs from [place], Lipids, Thyroid Function, Hematology (current and previous including abstractions from other facilities); face-to-face time discussing  her blood glucose readings/logs, discussing hypoglycemia and hyperglycemia episodes and symptoms, medications doses, her options of short and long term treatment based on the latest standards of care / guidelines;  discussion about incorporating lifestyle medicine;  and documenting the encounter. Risk reduction counseling performed per USPSTF guidelines to reduce obesity and cardiovascular risk factors.     Please refer to Patient Instructions for Blood Glucose Monitoring and Insulin/Medications Dosing Guide"  in media tab for additional information. Please  also refer to " Patient Self Inventory" in the Media  tab for reviewed elements of pertinent patient history.  Kimberly [name] participated in the discussions,  expressed understanding, and voiced agreement with the above plans.  All questions were answered to her satisfaction. she is encouraged to contact clinic should she have any questions or concerns prior to her return visit.     Follow up plan: - Return in about 4 months (around 12/09/2022) for Diabetes F/U with A1c in office, No previsit labs, Bring meter and logs.   Rayetta Pigg, [facility] [address] [place], [place] [address] Phone: [phone] Fax: [phone]  08/09/2022, 11:07 AM

## 2022-08-16 DIAGNOSIS — K635 Polyp of colon: Secondary | ICD-10-CM | POA: Diagnosis not present

## 2022-08-16 DIAGNOSIS — Z1211 Encounter for screening for malignant neoplasm of colon: Secondary | ICD-10-CM | POA: Diagnosis not present

## 2022-08-16 DIAGNOSIS — D128 Benign neoplasm of rectum: Secondary | ICD-10-CM | POA: Diagnosis not present

## 2022-08-16 DIAGNOSIS — D120 Benign neoplasm of cecum: Secondary | ICD-10-CM | POA: Diagnosis not present

## 2022-08-16 DIAGNOSIS — K5730 Diverticulosis of large intestine without perforation or abscess without bleeding: Secondary | ICD-10-CM | POA: Diagnosis not present

## 2022-08-16 DIAGNOSIS — K621 Rectal polyp: Secondary | ICD-10-CM | POA: Diagnosis not present

## 2022-08-24 ENCOUNTER — Ambulatory Visit (INDEPENDENT_AMBULATORY_CARE_PROVIDER_SITE_OTHER): Payer: Medicare Other | Admitting: Family Medicine

## 2022-08-24 DIAGNOSIS — E782 Mixed hyperlipidemia: Secondary | ICD-10-CM | POA: Diagnosis not present

## 2022-08-24 DIAGNOSIS — N1831 Chronic kidney disease, stage 3a: Secondary | ICD-10-CM

## 2022-08-24 DIAGNOSIS — E1122 Type 2 diabetes mellitus with diabetic chronic kidney disease: Secondary | ICD-10-CM

## 2022-08-24 DIAGNOSIS — E039 Hypothyroidism, unspecified: Secondary | ICD-10-CM | POA: Diagnosis not present

## 2022-08-24 DIAGNOSIS — I10 Essential (primary) hypertension: Secondary | ICD-10-CM

## 2022-08-24 DIAGNOSIS — Z23 Encounter for immunization: Secondary | ICD-10-CM

## 2022-08-24 NOTE — Assessment & Plan Note (Signed)
Well-controlled.  Most recent metabolic panel revealed improvement in creatinine and GFR.  Continue current treatment.  Continue follow-up with endocrinology.

## 2022-08-24 NOTE — Progress Notes (Signed)
Subjective:  Patient ID: Kimberly [name], female    DOB: Jan 27, 1951  Age: 71 y.o. MRN: [id number]  CC: Chief Complaint  Patient presents with   Hypertension    HPI:  71 year old female with the below mentioned medical problems presents for follow-up.  BP mildly elevated today.  However given her age, this is acceptable.  Needs to monitor at home.  She is compliant with HCTZ, losartan, metoprolol.  Type 2 diabetes is well-controlled.  A1c at goal.  She is followed closely by endocrinology.  She is currently on glipizide, metformin, and [place].  No hypoglycemia.  Patient states that glipizide will be discontinued after the holidays.  Hypothyroidism stable.  Monitored by endocrinology.  Desires flu vaccine today.  Doing well from a breast cancer perspective.  Remains on Arimidex.  Patient Active Problem List   Diagnosis Date Noted   Mixed hyperlipidemia 08/24/2022   Genetic testing 10/25/2021   Type 2 diabetes with kidney complications ([place]) 22/63/3354   Vitamin D deficiency 09/05/2021   Breast cancer of upper-outer quadrant of right female breast ([place]) 08/31/2021   S/P mastectomy, right 56/25/6389   Nonalcoholic steatohepatitis ([facility]) 11/29/2020   Urinary incontinence 05/07/2020   Depression 12/29/2015   S/P knee replacement 09/02/2013   Essential hypertension, benign 07/06/2013   Hypothyroidism 07/06/2013    Social Hx   Social History   Socioeconomic History   Marital status: Married    Spouse name: Not on file   Number of children: 5   Years of education: Not on file   Highest education level: Not on file  Occupational History   Occupation: CNA    Employer: [employer]  Tobacco Use   Smoking status: Never   Smokeless tobacco: Never  Vaping Use   Vaping Use: Never used  Substance and Sexual Activity   Alcohol use: No   Drug use: No   Sexual activity: Yes    Birth control/protection: Surgical  Other Topics Concern   Not on file  Social History  Narrative   Not on file   Social Determinants of Health   Financial Resource Strain: Low Risk  (02/07/2022)   Overall Financial Resource Strain (CARDIA)    Difficulty of Paying Living Expenses: Not hard at all  Food Insecurity: No Food Insecurity (02/07/2022)   Hunger Vital Sign    Worried About Running Out of Food in the Last Year: Never true    Ran Out of Food in the Last Year: Never true  Transportation Needs: No Transportation Needs (02/07/2022)   PRAPARE - Hydrologist (Medical): No    Lack of Transportation (Non-Medical): No  Physical Activity: Insufficiently Active (02/07/2022)   Exercise Vital Sign    Days of Exercise per Week: 2 days    Minutes of Exercise per Session: 60 min  Stress: No Stress Concern Present (02/07/2022)   [place]    Feeling of Stress : Only a little  Social Connections: Socially Integrated (02/07/2022)   Social Connection and Isolation Panel [NHANES]    Frequency of Communication with Friends and Family: More than three times a week    Frequency of Social Gatherings with Friends and Family: More than three times a week    Attends Religious Services: More than 4 times per year    Active Member of [employer] or Organizations: Yes    Attends Archivist Meetings: More than 4 times per year    Marital  Status: Married    Review of Systems  Constitutional: Negative.   Respiratory: Negative.    Cardiovascular: Negative.    Objective:  BP (!) 148/67   Pulse (!) 59   Temp (!) 97.2 F (36.2 C)   Ht 5' 3.5" (1.613 m)   Wt 193 lb (87.5 kg)   SpO2 96%   BMI 33.65 kg/m      08/24/2022   10:35 AM 08/09/2022   10:09 AM 06/13/2022    2:28 PM  BP/Weight  Systolic BP 791 505 697  Diastolic BP 67 77 78  Wt. (Lbs) 193 191 188.5  BMI 33.65 kg/m2 33.3 kg/m2 32.87 kg/m2    Physical Exam Vitals and nursing note reviewed.  Constitutional:      General: She  is not in acute distress.    Appearance: Normal appearance. She is obese.  HENT:     Head: Normocephalic and atraumatic.  Eyes:     General:        Right eye: No discharge.        Left eye: No discharge.     Conjunctiva/sclera: Conjunctivae normal.  Cardiovascular:     Rate and Rhythm: Normal rate and regular rhythm.  Pulmonary:     Effort: Pulmonary effort is normal.     Breath sounds: No wheezing, rhonchi or rales.  Neurological:     Mental Status: She is alert.  Psychiatric:        Mood and Affect: Mood normal.        Behavior: Behavior normal.     Lab Results  Component Value Date   WBC 7.1 06/05/2022   HGB 11.9 (L) 06/05/2022   HCT 35.2 (L) 06/05/2022   PLT 329 06/05/2022   GLUCOSE 140 (H) 06/05/2022   CHOL 158 07/31/2022   TRIG 180 (H) 07/31/2022   HDL 39 (L) 07/31/2022   LDLCALC 88 07/31/2022   ALT 18 06/05/2022   AST 17 06/05/2022   NA 141 06/05/2022   K 4.1 06/05/2022   CL 105 06/05/2022   CREATININE 0.87 06/05/2022   BUN 20 06/05/2022   CO2 28 06/05/2022   TSH 0.414 (L) 07/31/2022   INR 1.04 09/04/2014   HGBA1C 6.9 (A) 08/09/2022   MICROALBUR 10 mg/L 08/09/2022     Assessment & Plan:   Problem List Items Addressed This Visit       Cardiovascular and Mediastinum   Essential hypertension, benign    BP mildly elevated today.  Monitor blood pressures at home.  Continue current treatment with losartan, metoprolol, and HCTZ.        Endocrine   Hypothyroidism    Stable.  Continue close follow-up with endocrinology.  Continue current Synthroid dosing.      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)    Well-controlled.  Most recent metabolic panel revealed improvement in creatinine and GFR.  Continue current treatment.  Continue follow-up with endocrinology.        Other   Mixed hyperlipidemia    Fairly well controlled.  Will continue to monitor closely.      Other Visit Diagnoses     Immunization due       Relevant Orders   Flu  Vaccine QUAD 2moIM (Fluarix, Fluzone & Alfiuria Quad PF)      Follow-up:  6 months  JOakleyDO RApplegate

## 2022-08-24 NOTE — Assessment & Plan Note (Signed)
Stable.  Continue close follow-up with endocrinology.  Continue current Synthroid dosing.

## 2022-08-24 NOTE — Assessment & Plan Note (Signed)
Fairly well controlled.  Will continue to monitor closely.

## 2022-08-24 NOTE — Patient Instructions (Signed)
Everything looks good.  Follow up in 6 months.  Be sure to get your Flu vaccine.  Take care  Dr. Lacinda Axon

## 2022-08-24 NOTE — Assessment & Plan Note (Signed)
BP mildly elevated today.  Monitor blood pressures at home.  Continue current treatment with losartan, metoprolol, and HCTZ.

## 2022-08-25 DIAGNOSIS — Z23 Encounter for immunization: Secondary | ICD-10-CM | POA: Diagnosis not present

## 2022-10-02 ENCOUNTER — Other Ambulatory Visit: Payer: Self-pay | Admitting: Nurse Practitioner

## 2022-10-02 DIAGNOSIS — E039 Hypothyroidism, unspecified: Secondary | ICD-10-CM

## 2022-10-14 ENCOUNTER — Other Ambulatory Visit: Payer: Self-pay | Admitting: Family Medicine

## 2022-10-25 ENCOUNTER — Other Ambulatory Visit: Payer: Self-pay | Admitting: Nurse Practitioner

## 2022-10-26 ENCOUNTER — Encounter: Payer: Self-pay | Admitting: Radiology

## 2022-11-22 ENCOUNTER — Other Ambulatory Visit: Payer: Self-pay | Admitting: Nurse Practitioner

## 2022-11-24 ENCOUNTER — Telehealth: Payer: Self-pay

## 2022-11-24 NOTE — Telephone Encounter (Signed)
Prescription Request  11/24/2022  LOV: Visit date not found  What is the name of the medication or equipment? gabapentin (NEURONTIN) 100 MG capsule   Have you contacted your pharmacy to request a refill? Yes   Which pharmacy would you like this sent to? [facility]    Patient notified that their request is being sent to the clinical staff for review and that they should receive a response within 2 business days.   Please advise at Mobile [phone] (mobile)

## 2022-11-25 ENCOUNTER — Other Ambulatory Visit: Payer: Self-pay | Admitting: Family Medicine

## 2022-11-25 MED ORDER — GABAPENTIN 100 MG PO CAPS: 100.0000 mg | ORAL_CAPSULE | Freq: Three times a day (TID) | ORAL | 1 refills | Status: DC

## 2022-11-27 NOTE — Telephone Encounter (Signed)
Cook, [name], DO     Refilled.     

## 2022-11-27 NOTE — Telephone Encounter (Signed)
Patient notified

## 2022-11-30 ENCOUNTER — Ambulatory Visit (INDEPENDENT_AMBULATORY_CARE_PROVIDER_SITE_OTHER): Payer: Medicare Other | Admitting: Orthopedic Surgery

## 2022-11-30 ENCOUNTER — Other Ambulatory Visit: Payer: Self-pay

## 2022-11-30 ENCOUNTER — Other Ambulatory Visit (INDEPENDENT_AMBULATORY_CARE_PROVIDER_SITE_OTHER): Payer: Medicare Other

## 2022-11-30 ENCOUNTER — Encounter: Payer: Self-pay | Admitting: Orthopedic Surgery

## 2022-11-30 DIAGNOSIS — M1712 Unilateral primary osteoarthritis, left knee: Secondary | ICD-10-CM

## 2022-11-30 DIAGNOSIS — Z96651 Presence of right artificial knee joint: Secondary | ICD-10-CM

## 2022-11-30 DIAGNOSIS — Z96652 Presence of left artificial knee joint: Secondary | ICD-10-CM | POA: Diagnosis not present

## 2022-11-30 DIAGNOSIS — M1711 Unilateral primary osteoarthritis, right knee: Secondary | ICD-10-CM

## 2022-11-30 NOTE — Progress Notes (Signed)
Chief Complaint  Patient presents with   Follow-up    Bilateral knee replacements right in 2014 left in 2016 annual follow-up   Annual follow-up status post bilateral total knees right in 2014 left in 2016  Patient ID: Kimberly [name], female   DOB: 07/09/1951, 72 y.o.   MRN: [id number]  Chief Complaint  Patient presents with   Follow-up    Bilateral knee replacements right in 2014 left in 2016 annual follow-up    HPI Kimberly [name] is a 72 y.o. female.  Here for annual checkups Status post bilateral total knee arthroplasty. The patient is doing well the knee implant is functioning well.     Physical Exam There were no vitals taken for this visit.  Gait: Normal  Assist device none  ROM: Right knee 130 left knee 130   Data Reviewed Today's imaging shows stable total knee implant without loosening or complication  Assessment X-rays again no issues doing well   Plan Return 2 years x-ray    Arther Abbott 11/30/2022, 10:07 AM

## 2022-12-11 ENCOUNTER — Inpatient Hospital Stay: Payer: Medicare Other | Attending: Hematology

## 2022-12-11 ENCOUNTER — Ambulatory Visit: Payer: Medicare Other | Admitting: Nurse Practitioner

## 2022-12-11 DIAGNOSIS — C50411 Malignant neoplasm of upper-outer quadrant of right female breast: Secondary | ICD-10-CM | POA: Insufficient documentation

## 2022-12-11 DIAGNOSIS — Z170 Estrogen receptor positive status [ER+]: Secondary | ICD-10-CM | POA: Diagnosis not present

## 2022-12-11 DIAGNOSIS — E559 Vitamin D deficiency, unspecified: Secondary | ICD-10-CM

## 2022-12-11 DIAGNOSIS — Z803 Family history of malignant neoplasm of breast: Secondary | ICD-10-CM | POA: Diagnosis not present

## 2022-12-11 DIAGNOSIS — Z800 Family history of malignant neoplasm of digestive organs: Secondary | ICD-10-CM | POA: Diagnosis not present

## 2022-12-11 DIAGNOSIS — C773 Secondary and unspecified malignant neoplasm of axilla and upper limb lymph nodes: Secondary | ICD-10-CM | POA: Insufficient documentation

## 2022-12-11 DIAGNOSIS — Z8042 Family history of malignant neoplasm of prostate: Secondary | ICD-10-CM | POA: Insufficient documentation

## 2022-12-11 DIAGNOSIS — Z9011 Acquired absence of right breast and nipple: Secondary | ICD-10-CM | POA: Diagnosis not present

## 2022-12-11 DIAGNOSIS — Z79811 Long term (current) use of aromatase inhibitors: Secondary | ICD-10-CM | POA: Insufficient documentation

## 2022-12-11 LAB — COMPREHENSIVE METABOLIC PANEL
ALT: 19 U/L (ref 0–44)
AST: 20 U/L (ref 15–41)
Albumin: 4.1 g/dL (ref 3.5–5.0)
Alkaline Phosphatase: 97 U/L (ref 38–126)
Anion gap: 8 (ref 5–15)
BUN: 25 mg/dL — ABNORMAL HIGH (ref 8–23)
CO2: 26 mmol/L (ref 22–32)
Calcium: 10.5 mg/dL — ABNORMAL HIGH (ref 8.9–10.3)
Chloride: 101 mmol/L (ref 98–111)
Creatinine, Ser: 1.14 mg/dL — ABNORMAL HIGH (ref 0.44–1.00)
GFR, Estimated: 51 mL/min — ABNORMAL LOW (ref >60)
Glucose, Bld: 186 mg/dL — ABNORMAL HIGH (ref 70–99)
Potassium: 3.7 mmol/L (ref 3.5–5.1)
Sodium: 135 mmol/L (ref 135–145)
Total Bilirubin: 0.4 mg/dL (ref 0.3–1.2)
Total Protein: 7.7 g/dL (ref 6.5–8.1)

## 2022-12-11 LAB — CBC WITH DIFFERENTIAL/PLATELET
Abs Immature Granulocytes: 0.01 10*3/uL (ref 0.00–0.07)
Basophils Absolute: 0.1 10*3/uL (ref 0.0–0.1)
Basophils Relative: 1 %
Eosinophils Absolute: 0.4 10*3/uL (ref 0.0–0.5)
Eosinophils Relative: 5 %
HCT: 34.6 % — ABNORMAL LOW (ref 36.0–46.0)
Hemoglobin: 11.8 g/dL — ABNORMAL LOW (ref 12.0–15.0)
Immature Granulocytes: 0 %
Lymphocytes Relative: 28 %
Lymphs Abs: 2.1 10*3/uL (ref 0.7–4.0)
MCH: 32.5 pg (ref 26.0–34.0)
MCHC: 34.1 g/dL (ref 30.0–36.0)
MCV: 95.3 fL (ref 80.0–100.0)
Monocytes Absolute: 0.7 10*3/uL (ref 0.1–1.0)
Monocytes Relative: 9 %
Neutro Abs: 4.5 10*3/uL (ref 1.7–7.7)
Neutrophils Relative %: 57 %
Platelets: 344 10*3/uL (ref 150–400)
RBC: 3.63 MIL/uL — ABNORMAL LOW (ref 3.87–5.11)
RDW: 12.5 % (ref 11.5–15.5)
WBC: 7.8 10*3/uL (ref 4.0–10.5)
nRBC: 0 % (ref 0.0–0.2)

## 2022-12-11 LAB — VITAMIN D 25 HYDROXY (VIT D DEFICIENCY, FRACTURES): Vit D, 25-Hydroxy: 36.29 ng/mL (ref 30–100)

## 2022-12-12 ENCOUNTER — Other Ambulatory Visit: Payer: Medicare Other

## 2022-12-13 LAB — CANCER ANTIGEN 15-3: CA 15-3: 20.3 U/mL (ref 0.0–25.0)

## 2022-12-18 NOTE — Progress Notes (Signed)
[facility] [address], [place] [address]    Clinic Day:  12/19/2022  Referring physician: Tommie Sams, DO  Patient Care Team: Tommie Sams, DO as PCP - General (Family Medicine) Doreatha Massed, MD as Medical Oncologist (Medical Oncology)   ASSESSMENT & PLAN:   Assessment: 1. Stage IIa (T1CN1) right breast upper outer quadrant IDC: - Abnormal mammogram on 06/02/2021. - Right breast 9:00 mass biopsy on 06/30/2021, invasive ductal carcinoma, ER 70% positive, PR negative, Ki-67 2%, HER2 1+.  Right axillary lymph node biopsy was positive for metastatic carcinoma. - She met with Dr. Ulice Bold for possible reconstruction but decided against it. - She underwent right breast mastectomy with lymph node biopsy on 07/29/2021. - Pathology showed 1.1 cm grade 2 IDC, associated DCIS, margins negative.  Metastatic carcinoma involving 1/9 lymph nodes.  PT1CPN1A. - Oncotype DX recurrence score 22.  Anastrozole started in February 2023.   2. Social/family history: - She lives at home with her husband.  Today she is seen with her daughter. 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  Non-smoker. - Father had squamous cell carcinoma of the lip which has metastasized.  Maternal grandmother had colon cancer.  Paternal uncle had prostate cancer and paternal aunt had breast cancer.    Plan: 1. Stage IIa (T1CN1) right breast IDC, ER positive, PR/HER2 negative: - She is tolerating anastrozole very well.  Minor hot flashes and arthralgias are stable. - She reported left breast pain of 2 months duration.  She reports sharp pains. - Physical exam: Right mastectomy site is within normal limits.  No palpable adenopathy.  Left breast has no palpable masses. - Reviewed mammogram from 06/13/2022: BI-RADS Category 1. - Recommend left breast diagnostic mammogram because of new symptoms.  RTC after the mammogram.  2.  Bone health (DEXA scan 09/12/2021 T score  -0.5): - She is not on calcium or vitamin D supplements.  Vitamin D is 36 today.  However calcium is elevated at 10.5 for the first time. - Will do further workup with BMP, intact PTH and 125 dihydroxy vitamin D.  Orders Placed This Encounter  Procedures   MM DIAG BREAST TOMO UNI LEFT    Standing Status:   Future    Standing Expiration Date:   12/19/2023    Order Specific Question:   Reason for Exam (SYMPTOM  OR DIAGNOSIS REQUIRED)    Answer:   breast cancer screening    Order Specific Question:   Preferred imaging location?    Answer:   [facility]      I,Katie Daubenspeck,acting as a scribe for Doreatha Massed, MD.,have documented all relevant documentation on the behalf of Doreatha Massed, MD,as directed by  Doreatha Massed, MD while in the presence of Doreatha Massed, MD.   I, Doreatha Massed MD, have reviewed the above documentation for accuracy and completeness, and I agree with the above.   Doreatha Massed, MD   4/23/202412:55 PM  CHIEF COMPLAINT:   Diagnosis: right breast cancer    Cancer Staging  Breast cancer of upper-outer quadrant of right female breast Staging form: Breast, AJCC 8th Edition - Clinical stage from 08/31/2021: Stage IIA (cT1c, cN1, cM0, G2, ER+, PR-, HER2-) - Unsigned    Prior Therapy: right breast mastectomy with lymph node biopsy on 07/29/2021   Current Therapy:  anastrozole   HISTORY OF PRESENT ILLNESS:   Oncology History  Breast cancer of upper-outer quadrant of right female breast  08/31/2021 Initial Diagnosis   Breast cancer of upper-outer quadrant of right female breast [facility])    Genetic Testing   Negative genetic testing. No pathogenic variants identified on the [facility] Multi-Cancer+RNA panel. VUS in [facility] called c.1022G>A identified. The report date is 10/24/2021.  The Multi-Cancer Panel + RNA offered by [facility] includes sequencing and/or deletion duplication testing of the following 84 genes: AIP, ALK, APC,  ATM, AXIN2,BAP1,  BARD1, BLM, BMPR1A, BRCA1, BRCA2, BRIP1, CASR, CDC73, CDH1, CDK4, CDKN1B, CDKN1C, CDKN2A (p14ARF), CDKN2A (p16INK4a), CEBPA, CHEK2, CTNNA1, DICER1, DIS3L2, EGFR (c.2369C>T, p.Thr790Met variant only), EPCAM (Deletion/duplication testing only), FH, FLCN, GATA2, GPC3, GREM1 (Promoter region deletion/duplication testing only), HOXB13 (c.251G>A, p.Gly84Glu), HRAS, KIT, MAX, MEN1, MET, MITF (c.952G>A, p.Glu318Lys variant only), MLH1, MSH2, MSH3, MSH6, MUTYH, NBN, NF1, NF2, NTHL1, PALB2, PDGFRA, PHOX2B, PMS2, POLD1, POLE, POT1, PRKAR1A, PTCH1, PTEN, RAD50, RAD51C, RAD51D, RB1, RECQL4, RET, RUNX1, SDHAF2, SDHA (sequence changes only), SDHB, SDHC, SDHD, SMAD4, SMARCA4, SMARCB1, SMARCE1, STK11, SUFU, TERC, TERT, TMEM127, TP53, TSC1, TSC2, VHL, WRN and WT1.      INTERVAL HISTORY:   Kimberly [name] is a 72 y.o. female presenting to clinic today for follow up of right breast cancer. She was last seen by me on 06/13/22.  Today, she states that she is doing well overall. Her appetite level is at 50%. Her energy level is at 70%.  PAST MEDICAL HISTORY:   Past Medical History: Past Medical History:  Diagnosis Date   Anxiety    Arthritis    Depression    Diabetes mellitus    Family history of breast cancer    Family history of colon cancer    Family history of prostate cancer    Family history of skin cancer    GERD (gastroesophageal reflux disease)    High blood pressure    Hypothyroid    Obesity    PONV (postoperative nausea and vomiting)     Surgical History: Past Surgical History:  Procedure Laterality Date   ABDOMINAL HYSTERECTOMY     [place] Right    CARPAL TUNNEL RELEASE Right    KNEE ARTHROSCOPY Right    MASTECTOMY MODIFIED RADICAL Right 07/29/2021   Procedure: MASTECTOMY MODIFIED RADICAL;  Surgeon: Franky Macho, MD;  Location: [facility];  Service: General;  Laterality: Right;   THYROID SURGERY     removal   TOTAL KNEE ARTHROPLASTY Right 08/25/2013   Procedure: TOTAL KNEE  ARTHROPLASTY;  Surgeon: Vickki Hearing, MD;  Location: [facility];  Service: Orthopedics;  Laterality: Right;   TOTAL KNEE ARTHROPLASTY Left 09/09/2014   Procedure: LEFT TOTAL KNEE ARTHROPLASTY;  Surgeon: Vickki Hearing, MD;  Location: [facility];  Service: Orthopedics;  Laterality: Left;    Social History: Social History   Socioeconomic History   Marital status: Married    Spouse name: Not on file   Number of children: 5   Years of education: Not on file   Highest education level: Not on file  Occupational History   Occupation: CNA    Employer: [employer]  Tobacco Use   Smoking status: Never   Smokeless tobacco: Never  Vaping Use   Vaping Use: Never used  Substance and Sexual Activity   Alcohol use: No   Drug use: No   Sexual activity: Yes    Birth control/protection: Surgical  Other Topics Concern   Not on file  Social History Narrative   Not on file   Social Determinants of Health   Financial Resource Strain: Low Risk  (02/07/2022)   Overall  Financial Resource Strain (CARDIA)    Difficulty of Paying Living Expenses: Not hard at all  Food Insecurity: No Food Insecurity (02/07/2022)   Hunger Vital Sign    Worried About Running Out of Food in the Last Year: Never true    Ran Out of Food in the Last Year: Never true  Transportation Needs: No Transportation Needs (02/07/2022)   PRAPARE - Administrator, Civil Service (Medical): No    Lack of Transportation (Non-Medical): No  Physical Activity: Insufficiently Active (02/07/2022)   Exercise Vital Sign    Days of Exercise per Week: 2 days    Minutes of Exercise per Session: 60 min  Stress: No Stress Concern Present (02/07/2022)   [employer] of Occupational Health - Occupational Stress Questionnaire    Feeling of Stress : Only a little  Social Connections: Socially Integrated (02/07/2022)   Social Connection and Isolation Panel [NHANES]    Frequency of Communication with Friends and Family: More than three  times a week    Frequency of Social Gatherings with Friends and Family: More than three times a week    Attends Religious Services: More than 4 times per year    Active Member of [employer] or Organizations: Yes    Attends Engineer, structural: More than 4 times per year    Marital Status: Married  Catering manager Violence: Not At Risk (02/07/2022)   Humiliation, Afraid, Rape, and Kick questionnaire    Fear of Current or Ex-Partner: No    Emotionally Abused: No    Physically Abused: No    Sexually Abused: No    Family History: Family History  Problem Relation Age of Onset   Diabetes Mother    Hypertension Mother    Congestive Heart Failure Father    Hypertension Father    Skin cancer Father 84       squamous cell on lip; metastatic   Hypertension Brother    Skin cancer Brother        squamous cell   Breast cancer Paternal Aunt        dx 28s d. 59s   Prostate cancer Paternal Uncle        dx 64s   Colon cancer Maternal Grandmother        dx 85s d. 28s   Diabetes Other    Cancer Cousin        unk type; possibly pancreatic    Current Medications:  Current Outpatient Medications:    anastrozole (ARIMIDEX) 1 MG tablet, TAKE 1 TABLET DAILY, Disp: 90 tablet, Rfl: 3   Calcium-Vitamin D 500-3.125 MG-MCG TABS, Take by mouth. Takes 2 daily, Disp: , Rfl:    Continuous Blood Gluc Sensor (DEXCOM G7 SENSOR) MISC, by Does not apply route., Disp: , Rfl:    diclofenac Sodium (VOLTAREN) 1 % GEL, Apply 2 g topically daily as needed (knee pain)., Disp: , Rfl:    famotidine (PEPCID) 40 MG tablet, TAKE 1 TABLET DAILY, Disp: 90 tablet, Rfl: 0   gabapentin (NEURONTIN) 100 MG capsule, Take 1 capsule (100 mg total) by mouth 3 (three) times daily., Disp: 270 capsule, Rfl: 1   glipiZIDE (GLUCOTROL XL) 5 MG 24 hr tablet, Take 1 tablet (5 mg total) by mouth daily with breakfast., Disp: 90 tablet, Rfl: 1   hydrochlorothiazide (HYDRODIURIL) 25 MG tablet, TAKE 1 TABLET DAILY, Disp: 90 tablet, Rfl:  0   insulin degludec (TRESIBA FLEXTOUCH) 100 UNIT/ML FlexTouch Pen, Inject 25 Units into the skin at bedtime.,  Disp: 25 mL, Rfl: 3   Insulin Pen Needle (B-D ULTRAFINE III SHORT PEN) 31G X 8 MM MISC, 1 each by Does not apply route as directed. Use to inject insulin once daily, Disp: 100 each, Rfl: 3   levothyroxine (SYNTHROID) 125 MCG tablet, TAKE 1 TABLET ONCE DAILY   BEFORE BREAKFAST, Disp: 90 tablet, Rfl: 0   losartan (COZAAR) 100 MG tablet, TAKE 1 TABLET EVERY MORNING, Disp: 90 tablet, Rfl: 0   metFORMIN (GLUCOPHAGE-XR) 500 MG 24 hr tablet, Take 1 tablet (500 mg total) by mouth 2 (two) times daily with a meal., Disp: 180 tablet, Rfl: 0   metoprolol succinate (TOPROL-XL) 100 MG 24 hr tablet, TAKE 1 TABLET AT BEDTIME   WITH OR IMMEDIATELY        FOLLOWING A MEAL, Disp: 90 tablet, Rfl: 0   naproxen (NAPROSYN) 500 MG tablet, Take 1 tablet (500 mg total) by mouth 2 (two) times daily with a meal., Disp: 28 tablet, Rfl: 0   omeprazole (PRILOSEC) 20 MG capsule, TAKE 1 CAPSULE AT BEDTIME, Disp: 90 capsule, Rfl: 0   tolterodine (DETROL LA) 4 MG 24 hr capsule, Take 1 capsule (4 mg total) by mouth daily., Disp: 90 capsule, Rfl: 3   ULTICARE MINI PEN NEEDLES 31G X 6 MM MISC, USE ONCE DAILY AS DIRECTED., Disp: 100 each, Rfl: 3   venlafaxine XR (EFFEXOR-XR) 75 MG 24 hr capsule, TAKE 1 CAPSULE BY MOUTH ONCE DAILY WITH BREAKFAST, Disp: 90 capsule, Rfl: 1 No current facility-administered medications for this visit.  Facility-Administered Medications Ordered in Other Visits:    bupivacaine liposome (EXPAREL) 1.3 % injection 266 mg, 20 mL, Infiltration, Once, Vickki Hearing, MD   Allergies: Allergies  Allergen Reactions   Ace Inhibitors Cough    REVIEW OF SYSTEMS:   Review of Systems  Constitutional:  Negative for chills, fatigue and fever.  HENT:   Negative for lump/mass, mouth sores, nosebleeds, sore throat and trouble swallowing.   Eyes:  Negative for eye problems.  Respiratory:  Positive for  cough. Negative for shortness of breath.   Cardiovascular:  Negative for chest pain, leg swelling and palpitations.  Gastrointestinal:  Negative for abdominal pain, constipation, diarrhea, nausea and vomiting.  Genitourinary:  Positive for frequency. Negative for bladder incontinence, difficulty urinating, dysuria, hematuria and nocturia.   Musculoskeletal:  Negative for arthralgias, back pain, flank pain, myalgias and neck pain.  Skin:  Negative for itching and rash.  Neurological:  Positive for headaches and numbness. Negative for dizziness.  Hematological:  Does not bruise/bleed easily.  Psychiatric/Behavioral:  Positive for depression. Negative for sleep disturbance and suicidal ideas. The patient is not nervous/anxious.   All other systems reviewed and are negative.    VITALS:   Blood pressure 136/66, pulse (!) 59, temperature (!) 97.4 F (36.3 C), temperature source Oral, resp. rate 17, height 5' 3.5" (1.613 m), weight 190 lb 12.8 oz (86.5 kg), SpO2 100 %.  Wt Readings from Last 3 Encounters:  12/19/22 190 lb 12.8 oz (86.5 kg)  08/24/22 193 lb (87.5 kg)  08/09/22 191 lb (86.6 kg)    Body mass index is 33.27 kg/m.  Performance status (ECOG): 1 - Symptomatic but completely ambulatory  PHYSICAL EXAM:   Physical Exam Vitals and nursing note reviewed. Exam conducted with a chaperone present.  Constitutional:      Appearance: Normal appearance.  Cardiovascular:     Rate and Rhythm: Normal rate and regular rhythm.     Pulses: Normal pulses.  Heart sounds: Normal heart sounds.  Pulmonary:     Effort: Pulmonary effort is normal.     Breath sounds: Normal breath sounds.  Abdominal:     Palpations: Abdomen is soft. There is no hepatomegaly, splenomegaly or mass.     Tenderness: There is no abdominal tenderness.  Musculoskeletal:     Right lower leg: No edema.     Left lower leg: No edema.  Lymphadenopathy:     Cervical: No cervical adenopathy.     Right cervical: No  superficial, deep or posterior cervical adenopathy.    Left cervical: No superficial, deep or posterior cervical adenopathy.     Upper Body:     Right upper body: No supraclavicular or axillary adenopathy.     Left upper body: No supraclavicular or axillary adenopathy.  Neurological:     General: No focal deficit present.     Mental Status: She is alert and oriented to person, place, and time.  Psychiatric:        Mood and Affect: Mood normal.        Behavior: Behavior normal.     LABS:      Latest Ref Rng & Units 12/11/2022    2:08 PM 06/05/2022    9:40 AM 01/09/2022   11:21 AM  CBC  WBC 4.0 - 10.5 K/uL 7.8  7.1  7.9   Hemoglobin 12.0 - 15.0 g/dL 14.7  82.9  56.2   Hematocrit 36.0 - 46.0 % 34.6  35.2  33.9   Platelets 150 - 400 K/uL 344  329  301       Latest Ref Rng & Units 12/11/2022    2:08 PM 06/05/2022    9:40 AM 01/09/2022   11:21 AM  CMP  Glucose 70 - 99 mg/dL 130  865  784   BUN 8 - 23 mg/dL 25  20  26    Creatinine 0.44 - 1.00 mg/dL 6.96  2.95  2.84   Sodium 135 - 145 mmol/L 135  141  136   Potassium 3.5 - 5.1 mmol/L 3.7  4.1  4.1   Chloride 98 - 111 mmol/L 101  105  102   CO2 22 - 32 mmol/L 26  28  27    Calcium 8.9 - 10.3 mg/dL 13.2  44.0  9.6   Total Protein 6.5 - 8.1 g/dL 7.7  7.6  7.7   Total Bilirubin 0.3 - 1.2 mg/dL 0.4  0.7  0.9   Alkaline Phos 38 - 126 U/L 97  73  70   AST 15 - 41 U/L 20  17  20    ALT 0 - 44 U/L 19  18  20       No results found for: "CEA1", "CEA" / No results found for: "CEA1", "CEA" No results found for: "PSA1" No results found for: "NUU725" No results found for: "CAN125"  No results found for: "TOTALPROTELP", "ALBUMINELP", "A1GS", "A2GS", "BETS", "BETA2SER", "GAMS", "MSPIKE", "SPEI" Lab Results  Component Value Date   TIBC 365 09/05/2021   TIBC 314 07/26/2015   FERRITIN 73 09/05/2021   FERRITIN 293 (H) 07/26/2015   IRONPCTSAT 20 09/05/2021   IRONPCTSAT 29 07/26/2015   No results found for: "LDH"   STUDIES:   DG Knee  AP/LAT W/Sunrise Right  Result Date: 11/30/2022 X-ray report Chief complaint annual follow-up right knee Images lateral and sunrise view (AP views of the left knee series), DePuy fixed-bearing posterior stabilized total knee is inserted 2014 [place]: Limited without any loosening or radiolucent  lines Impression: Stable left total knee at 10 years  DG Knee AP/LAT W/Sunrise Left  Result Date: 11/30/2022 X-ray report Chief complaint annual imaging of the right and left knee starting with the left knee Images AP of both left and right knee lateral left knee sunrise left knee Sigma PS fixed-bearing DePuy total knees 2016 implantation at [place]: Alignment without any loosening Impression: Stable total knee with loosening and normal alignment at yr #8

## 2022-12-19 ENCOUNTER — Other Ambulatory Visit (HOSPITAL_COMMUNITY): Payer: Self-pay | Admitting: Hematology

## 2022-12-19 ENCOUNTER — Inpatient Hospital Stay (HOSPITAL_BASED_OUTPATIENT_CLINIC_OR_DEPARTMENT_OTHER): Payer: Medicare Other | Admitting: Hematology

## 2022-12-19 ENCOUNTER — Other Ambulatory Visit: Payer: Self-pay | Admitting: *Deleted

## 2022-12-19 VITALS — BP 136/66 | HR 59 | Temp 97.4°F | Resp 17 | Ht 63.5 in | Wt 190.8 lb

## 2022-12-19 DIAGNOSIS — C773 Secondary and unspecified malignant neoplasm of axilla and upper limb lymph nodes: Secondary | ICD-10-CM | POA: Diagnosis not present

## 2022-12-19 DIAGNOSIS — Z853 Personal history of malignant neoplasm of breast: Secondary | ICD-10-CM

## 2022-12-19 DIAGNOSIS — Z800 Family history of malignant neoplasm of digestive organs: Secondary | ICD-10-CM | POA: Diagnosis not present

## 2022-12-19 DIAGNOSIS — Z79811 Long term (current) use of aromatase inhibitors: Secondary | ICD-10-CM | POA: Diagnosis not present

## 2022-12-19 DIAGNOSIS — Z9011 Acquired absence of right breast and nipple: Secondary | ICD-10-CM | POA: Diagnosis not present

## 2022-12-19 DIAGNOSIS — Z1231 Encounter for screening mammogram for malignant neoplasm of breast: Secondary | ICD-10-CM | POA: Diagnosis not present

## 2022-12-19 DIAGNOSIS — C50411 Malignant neoplasm of upper-outer quadrant of right female breast: Secondary | ICD-10-CM

## 2022-12-19 DIAGNOSIS — Z170 Estrogen receptor positive status [ER+]: Secondary | ICD-10-CM

## 2022-12-19 NOTE — Patient Instructions (Addendum)
[facility] - [facility]  Discharge Instructions  You were seen and examined today by Dr. Ellin Saba.  He reviewed the results of your lab work which are normal/stable.   He reviewed the results of your mammogram from October 2023 which was normal.   Continue anastrozole as prescribed.   We will see you back in 6 months. We will repeat lab work and a mammogram prior to your next visit.    Thank you for choosing [facility] - [facility] to provide your oncology and hematology care.   To afford each patient quality time with our provider, please arrive at least 15 minutes before your scheduled appointment time. You may need to reschedule your appointment if you arrive late (10 or more minutes). Arriving late affects you and other patients whose appointments are after yours.  Also, if you miss three or more appointments without notifying the office, you may be dismissed from the clinic at the provider's discretion.    Again, thank you for choosing [facility].  Our hope is that these requests will decrease the amount of time that you wait before being seen by our physicians.   If you have a lab appointment with the Cancer Center - please note that after April 8th, all labs will be drawn in the cancer center.  You do not have to check in or register with the main entrance as you have in the past but will complete your check-in at the cancer center.            _____________________________________________________________  Should you have questions after your visit to [facility], please contact our office at [phone] and follow the prompts.  Our office hours are 8:00 a.m. to 4:30 p.m. Monday - Thursday and 8:00 a.m. to 2:30 p.m. Friday.  Please note that voicemails left after 4:00 p.m. may not be returned until the following business day.  We are closed weekends and all major holidays.  You do have access to a nurse 24-7, just call the  main number to the clinic [phone] and do not press any options, hold on the line and a nurse will answer the phone.    For prescription refill requests, have your pharmacy contact our office and allow 72 hours.    Masks are no longer required in the cancer centers. If you would like for your care team to wear a mask while they are taking care of you, please let them know. You may have one support person who is at least 71 years old accompany you for your appointments.

## 2022-12-25 ENCOUNTER — Ambulatory Visit (INDEPENDENT_AMBULATORY_CARE_PROVIDER_SITE_OTHER): Payer: Medicare Other | Admitting: Nurse Practitioner

## 2022-12-25 ENCOUNTER — Encounter: Payer: Self-pay | Admitting: Nurse Practitioner

## 2022-12-25 VITALS — BP 110/70 | HR 76 | Ht 63.5 in | Wt 189.0 lb

## 2022-12-25 DIAGNOSIS — N1831 Chronic kidney disease, stage 3a: Secondary | ICD-10-CM

## 2022-12-25 DIAGNOSIS — E1122 Type 2 diabetes mellitus with diabetic chronic kidney disease: Secondary | ICD-10-CM

## 2022-12-25 DIAGNOSIS — E039 Hypothyroidism, unspecified: Secondary | ICD-10-CM

## 2022-12-25 DIAGNOSIS — I10 Essential (primary) hypertension: Secondary | ICD-10-CM | POA: Diagnosis not present

## 2022-12-25 DIAGNOSIS — E782 Mixed hyperlipidemia: Secondary | ICD-10-CM | POA: Diagnosis not present

## 2022-12-25 LAB — POCT GLYCOSYLATED HEMOGLOBIN (HGB A1C): Hemoglobin A1C: 6.9 % — AB (ref 4.0–5.6)

## 2022-12-25 MED ORDER — METFORMIN HCL ER 500 MG PO TB24: 500.0000 mg | ORAL_TABLET | Freq: Two times a day (BID) | ORAL | 3 refills | Status: DC

## 2022-12-25 MED ORDER — BD PEN NEEDLE SHORT U/F 31G X 8 MM MISC: 1.0000 | 3 refills | Status: DC

## 2022-12-25 MED ORDER — LEVOTHYROXINE SODIUM 125 MCG PO TABS
125.0000 ug | ORAL_TABLET | Freq: Every day | ORAL | 3 refills | Status: AC
Start: 2022-12-25 — End: ?

## 2022-12-25 MED ORDER — TRESIBA FLEXTOUCH 100 UNIT/ML ~~LOC~~ SOPN: 28.0000 [IU] | PEN_INJECTOR | Freq: Every day | SUBCUTANEOUS | 3 refills | Status: DC

## 2022-12-25 MED ORDER — GLIPIZIDE ER 5 MG PO TB24: 5.0000 mg | ORAL_TABLET | Freq: Every day | ORAL | 3 refills | Status: DC

## 2022-12-25 NOTE — Progress Notes (Signed)
Endocrinology Follow Up Note       12/25/2022, 10:33 AM   Subjective:    Patient ID: Kimberly [name], female    DOB: Mar 04, 1951.  Kimberly [name] is being seen in follow up after being seen in consultation for management of currently uncontrolled symptomatic diabetes requested by  Tommie Sams, DO.   Past Medical History:  Diagnosis Date   Anxiety    Arthritis    Depression    Diabetes mellitus    Family history of breast cancer    Family history of colon cancer    Family history of prostate cancer    Family history of skin cancer    GERD (gastroesophageal reflux disease)    High blood pressure    Hypothyroid    Obesity    PONV (postoperative nausea and vomiting)     Past Surgical History:  Procedure Laterality Date   ABDOMINAL HYSTERECTOMY     [place] Right    CARPAL TUNNEL RELEASE Right    KNEE ARTHROSCOPY Right    MASTECTOMY MODIFIED RADICAL Right 07/29/2021   Procedure: MASTECTOMY MODIFIED RADICAL;  Surgeon: Franky Macho, MD;  Location: [facility];  Service: General;  Laterality: Right;   THYROID SURGERY     removal   TOTAL KNEE ARTHROPLASTY Right 08/25/2013   Procedure: TOTAL KNEE ARTHROPLASTY;  Surgeon: Vickki Hearing, MD;  Location: [facility];  Service: Orthopedics;  Laterality: Right;   TOTAL KNEE ARTHROPLASTY Left 09/09/2014   Procedure: LEFT TOTAL KNEE ARTHROPLASTY;  Surgeon: Vickki Hearing, MD;  Location: [facility];  Service: Orthopedics;  Laterality: Left;    Social History   Socioeconomic History   Marital status: Married    Spouse name: Not on file   Number of children: 5   Years of education: Not on file   Highest education level: Not on file  Occupational History   Occupation: CNA    Employer: [employer]  Tobacco Use   Smoking status: Never   Smokeless tobacco: Never  Vaping Use   Vaping Use: Never used  Substance and Sexual Activity   Alcohol use: No   Drug use: No    Sexual activity: Yes    Birth control/protection: Surgical  Other Topics Concern   Not on file  Social History Narrative   Not on file   Social Determinants of Health   Financial Resource Strain: Low Risk  (02/07/2022)   Overall Financial Resource Strain (CARDIA)    Difficulty of Paying Living Expenses: Not hard at all  Food Insecurity: No Food Insecurity (02/07/2022)   Hunger Vital Sign    Worried About Running Out of Food in the Last Year: Never true    Ran Out of Food in the Last Year: Never true  Transportation Needs: No Transportation Needs (02/07/2022)   PRAPARE - Administrator, Civil Service (Medical): No    Lack of Transportation (Non-Medical): No  Physical Activity: Insufficiently Active (02/07/2022)   Exercise Vital Sign    Days of Exercise per Week: 2 days    Minutes of Exercise per Session: 60 min  Stress: No Stress Concern Present (02/07/2022)   [employer] of Occupational Health -  Occupational Stress Questionnaire    Feeling of Stress : Only a little  Social Connections: Socially Integrated (02/07/2022)   Social Connection and Isolation Panel [NHANES]    Frequency of Communication with Friends and Family: More than three times a week    Frequency of Social Gatherings with Friends and Family: More than three times a week    Attends Religious Services: More than 4 times per year    Active Member of [employer] or Organizations: Yes    Attends Engineer, structural: More than 4 times per year    Marital Status: Married    Family History  Problem Relation Age of Onset   Diabetes Mother    Hypertension Mother    Congestive Heart Failure Father    Hypertension Father    Skin cancer Father 86       squamous cell on lip; metastatic   Hypertension Brother    Skin cancer Brother        squamous cell   Breast cancer Paternal Aunt        dx 15s d. 58s   Prostate cancer Paternal Uncle        dx 30s   Colon cancer Maternal Grandmother        dx  61s d. 46s   Diabetes Other    Cancer Cousin        unk type; possibly pancreatic    Outpatient Encounter Medications as of 12/25/2022  Medication Sig   anastrozole (ARIMIDEX) 1 MG tablet TAKE 1 TABLET DAILY   Calcium-Vitamin D 500-3.125 MG-MCG TABS Take by mouth. Takes 2 daily   Continuous Blood Gluc Sensor (DEXCOM G7 SENSOR) MISC by Does not apply route.   diclofenac Sodium (VOLTAREN) 1 % GEL Apply 2 g topically daily as needed (knee pain).   famotidine (PEPCID) 40 MG tablet TAKE 1 TABLET DAILY   gabapentin (NEURONTIN) 100 MG capsule Take 1 capsule (100 mg total) by mouth 3 (three) times daily.   hydrochlorothiazide (HYDRODIURIL) 25 MG tablet TAKE 1 TABLET DAILY   losartan (COZAAR) 100 MG tablet TAKE 1 TABLET EVERY MORNING   metoprolol succinate (TOPROL-XL) 100 MG 24 hr tablet TAKE 1 TABLET AT BEDTIME   WITH OR IMMEDIATELY        FOLLOWING A MEAL   naproxen (NAPROSYN) 500 MG tablet Take 1 tablet (500 mg total) by mouth 2 (two) times daily with a meal.   omeprazole (PRILOSEC) 20 MG capsule TAKE 1 CAPSULE AT BEDTIME   tolterodine (DETROL LA) 4 MG 24 hr capsule Take 1 capsule (4 mg total) by mouth daily.   ULTICARE MINI PEN NEEDLES 31G X 6 MM MISC USE ONCE DAILY AS DIRECTED.   venlafaxine XR (EFFEXOR-XR) 75 MG 24 hr capsule TAKE 1 CAPSULE BY MOUTH ONCE DAILY WITH BREAKFAST   [DISCONTINUED] glipiZIDE (GLUCOTROL XL) 5 MG 24 hr tablet Take 1 tablet (5 mg total) by mouth daily with breakfast.   [DISCONTINUED] insulin degludec (TRESIBA FLEXTOUCH) 100 UNIT/ML FlexTouch Pen Inject 25 Units into the skin at bedtime.   [DISCONTINUED] Insulin Pen Needle (B-D ULTRAFINE III SHORT PEN) 31G X 8 MM MISC 1 each by Does not apply route as directed. Use to inject insulin once daily   [DISCONTINUED] levothyroxine (SYNTHROID) 125 MCG tablet TAKE 1 TABLET ONCE DAILY   BEFORE BREAKFAST   [DISCONTINUED] metFORMIN (GLUCOPHAGE-XR) 500 MG 24 hr tablet Take 1 tablet (500 mg total) by mouth 2 (two) times daily with a  meal.   glipiZIDE (GLUCOTROL XL)  5 MG 24 hr tablet Take 1 tablet (5 mg total) by mouth daily with breakfast.   insulin degludec (TRESIBA FLEXTOUCH) 100 UNIT/ML FlexTouch Pen Inject 28 Units into the skin at bedtime.   Insulin Pen Needle (B-D ULTRAFINE III SHORT PEN) 31G X 8 MM MISC 1 each by Does not apply route as directed. Use to inject insulin once daily   levothyroxine (SYNTHROID) 125 MCG tablet Take 1 tablet (125 mcg total) by mouth daily before breakfast.   metFORMIN (GLUCOPHAGE-XR) 500 MG 24 hr tablet Take 1 tablet (500 mg total) by mouth 2 (two) times daily with a meal.   Facility-Administered Encounter Medications as of 12/25/2022  Medication   bupivacaine liposome (EXPAREL) 1.3 % injection 266 mg    ALLERGIES: Allergies  Allergen Reactions   Ace Inhibitors Cough    VACCINATION STATUS: Immunization History  Administered Date(s) Administered   Fluad Quad(high Dose 65+) 07/12/2020   Influenza,inj,Quad PF,6+ Mos 04/30/2018, 08/24/2022   Influenza-Unspecified 05/21/2016, 05/03/2017, 06/21/2019   Moderna Sars-Covid-2 Vaccination 10/07/2019, 11/05/2019, 08/02/2020   Pneumococcal Conjugate-13 03/03/2014, 05/30/2017   Pneumococcal Polysaccharide-23 05/27/2010    Diabetes She presents for her follow-up diabetic visit. She has type 2 diabetes mellitus. Onset time: Diagnosed at approx age of 79. Her disease course has been improving. There are no hypoglycemic associated symptoms. Pertinent negatives for hypoglycemia include no nervousness/anxiousness or tremors. Associated symptoms include foot paresthesias. Pertinent negatives for diabetes include no fatigue, no polydipsia, no polyuria and no weight loss. There are no hypoglycemic complications. Symptoms are stable. Diabetic complications include nephropathy and peripheral neuropathy. Risk factors for coronary artery disease include diabetes mellitus, dyslipidemia, family history, obesity, post-menopausal and hypertension. Current  diabetic treatment includes oral agent (dual therapy) and insulin injections. She is compliant with treatment most of the time. Her weight is decreasing steadily. She is following a generally healthy diet. Meal planning includes avoidance of concentrated sweets. She has had a previous visit with a dietitian. She participates in exercise three times a week. Her home blood glucose trend is decreasing steadily. An ACE inhibitor/angiotensin II receptor blocker is being taken. She sees a podiatrist (has seen one in the distant past).Eye exam is current.  Hyperlipidemia This is a chronic problem. The current episode started more than 1 year ago. The problem is uncontrolled. Recent lipid tests were reviewed and are variable. Exacerbating diseases include chronic renal disease, diabetes, hypothyroidism and obesity. Factors aggravating her hyperlipidemia include thiazides and beta blockers. Current antihyperlipidemic treatment includes diet change and exercise. The current treatment provides mild improvement of lipids. There are no compliance problems.  Risk factors for coronary artery disease include diabetes mellitus, dyslipidemia, family history, hypertension, obesity and post-menopausal.  Hypertension This is a chronic problem. The current episode started more than 1 year ago. The problem has been resolved since onset. The problem is controlled. Pertinent negatives include no palpitations. Agents associated with hypertension include thyroid hormones. Risk factors for coronary artery disease include diabetes mellitus, dyslipidemia, family history, obesity and post-menopausal state. Past treatments include angiotensin blockers, beta blockers and diuretics. The current treatment provides moderate improvement. There are no compliance problems.  Hypertensive end-organ damage includes kidney disease. Identifiable causes of hypertension include chronic renal disease and a thyroid problem.  Thyroid Problem Presents for  follow-up visit. Patient reports no anxiety, cold intolerance, constipation, depressed mood, fatigue, heat intolerance, leg swelling, palpitations, tremors, weight gain or weight loss. The symptoms have been stable. Past treatments include levothyroxine. The treatment provided significant relief. Her past medical history  is significant for diabetes, hyperlipidemia and neuropathy (? vs chronic back issues).     Review of systems  Constitutional: + Minimally fluctuating body weight,  current Body mass index is 32.95 kg/m. , + fatigue, no subjective hyperthermia, no subjective hypothermia Eyes: no blurry vision, no xerophthalmia ENT: no sore throat, no nodules palpated in throat, no dysphagia/odynophagia, no hoarseness Cardiovascular: no chest pain, no shortness of breath, no palpitations, no leg swelling Respiratory: no cough, no shortness of breath Gastrointestinal: no nausea/vomiting/diarrhea Musculoskeletal: + diffuse muscle/joint aches- improving Skin: no rashes, no hyperemia Neurological: no tremors, no numbness, no tingling, no dizziness Psychiatric: no depression, no anxiety  Objective:     BP 110/70 (BP Location: Left Arm, Patient Position: Sitting, Cuff Size: Large)   Pulse 76   Ht 5' 3.5" (1.613 m)   Wt 189 lb (85.7 kg)   BMI 32.95 kg/m   Wt Readings from Last 3 Encounters:  12/25/22 189 lb (85.7 kg)  12/19/22 190 lb 12.8 oz (86.5 kg)  08/24/22 193 lb (87.5 kg)     BP Readings from Last 3 Encounters:  12/25/22 110/70  12/19/22 136/66  08/24/22 (!) 148/67       Physical Exam- Limited  Constitutional:  Body mass index is 32.95 kg/m. , not in acute distress, normal state of mind Eyes:  EOMI, no exophthalmos Musculoskeletal: no gross deformities, strength intact in all four extremities, no gross restriction of joint movements Skin:  no rashes, no hyperemia Neurological: no tremor with outstretched hands   Diabetic Foot Exam - Simple   Simple Foot Form Diabetic  Foot exam was performed with the following findings: Yes 12/25/2022 10:16 AM  Visual Inspection No deformities, no ulcerations, no other skin breakdown bilaterally: Yes Sensation Testing Intact to touch and monofilament testing bilaterally: Yes Pulse Check Posterior Tibialis and Dorsalis pulse intact bilaterally: Yes Comments      CMP ( most recent) CMP     Component Value Date/Time   NA 135 12/11/2022 1408   NA 138 10/03/2021 1419   K 3.7 12/11/2022 1408   CL 101 12/11/2022 1408   CO2 26 12/11/2022 1408   GLUCOSE 186 (H) 12/11/2022 1408   BUN 25 (H) 12/11/2022 1408   BUN 20 10/03/2021 1419   CREATININE 1.14 (H) 12/11/2022 1408   CREATININE 1.06 08/11/2014 0701   CALCIUM 10.5 (H) 12/11/2022 1408   PROT 7.7 12/11/2022 1408   PROT 7.5 10/03/2021 1419   ALBUMIN 4.1 12/11/2022 1408   ALBUMIN 4.3 10/03/2021 1419   AST 20 12/11/2022 1408   ALT 19 12/11/2022 1408   ALKPHOS 97 12/11/2022 1408   BILITOT 0.4 12/11/2022 1408   BILITOT 0.3 10/03/2021 1419   GFRNONAA 51 (L) 12/11/2022 1408   GFRAA 65 10/11/2020 1146     Diabetic Labs (most recent): Lab Results  Component Value Date   HGBA1C 6.9 (A) 12/25/2022   HGBA1C 6.9 (A) 08/09/2022   HGBA1C 7.8 02/02/2022   MICROALBUR 10 mg/L 08/09/2022   MICROALBUR 1.1 06/03/2014   MICROALBUR 3.48 (H) 07/09/2013     Lipid Panel ( most recent) Lipid Panel     Component Value Date/Time   CHOL 158 07/31/2022 0816   TRIG 180 (H) 07/31/2022 0816   HDL 39 (L) 07/31/2022 0816   CHOLHDL 4.1 07/31/2022 0816   CHOLHDL 3.7 06/03/2014 0709   VLDL 40 06/03/2014 0709   LDLCALC 88 07/31/2022 0816   LABVLDL 31 07/31/2022 0816      Lab Results  Component Value Date  TSH 0.414 (L) 07/31/2022   TSH 0.240 (L) 01/27/2022   TSH 0.133 (L) 10/03/2021   TSH 0.653 04/11/2021   TSH 1.490 04/19/2020   TSH 2.470 07/31/2019   TSH 0.528 04/22/2018   TSH 0.256 (L) 12/26/2017   TSH 1.030 07/08/2015   TSH 3.790 11/11/2014   FREET4 1.33  07/31/2022   FREET4 1.31 01/27/2022   FREET4 1.65 10/03/2021           Assessment & Plan:   1) Type 2 diabetes mellitus with diabetic nephropathy, without long-term current use of insulin (HCC)    - Kimberly [name] has currently uncontrolled symptomatic type 2 DM since 72 years of age.  -Recent labs reviewed.  - I had a long discussion with her about the progressive nature of diabetes and the pathology behind its complications. -her diabetes is complicated by mild CKD, peripheral neuropathy and she remains at a high risk for more acute and chronic complications which include CAD, CVA, CKD, retinopathy, and neuropathy. These are all discussed in detail with her.  - Nutritional counseling repeated at each appointment due to patients tendency to fall back in to old habits.  - The patient admits there is a room for improvement in their diet and drink choices. -  Suggestion is made for the patient to avoid simple carbohydrates from their diet including Cakes, Sweet Desserts / Pastries, Ice Cream, Soda (diet and regular), Sweet Tea, Candies, Chips, Cookies, Sweet Pastries, Store Bought Juices, Alcohol in Excess of 1-2 drinks a day, Artificial Sweeteners, Coffee Creamer, and "Sugar-free" Products. This will help patient to have stable blood glucose profile and potentially avoid unintended weight gain.   - I encouraged the patient to switch to unprocessed or minimally processed complex starch and increased protein intake (animal or plant source), fruits, and vegetables.   - Patient is advised to stick to a routine mealtimes to eat 3 meals a day and avoid unnecessary snacks (to snack only to correct hypoglycemia).  - she declines referral to Norm Salt, RDN, CDE for diabetes education at this time.  - I have approached her with the following individualized plan to manage her diabetes and patient agrees:   -She is advised to increase her Tresiba to 28 units SQ nightly, Metformin 500 mg  ER po twice daily with meals and Glipizide 5 mg XL daily with breakfast for now.  She did try stopping her Glipizide after the holidays be restarted it due to hyperglycemia.   -she is encouraged to continue monitoring blood glucose twice daily (using her CGM), before breakfast and before bed, and to call the clinic if she has readings less than 70 or above 200 for 3 tests in a row.   She is benefiting from CGM device.  - she will be considered for incretin therapy as appropriate next visit.  - Specific targets for  A1c; LDL, HDL, and Triglycerides were discussed with the patient.  2) Blood Pressure /Hypertension:  her blood pressure is controlled to target.   she is advised to continue her current medications including HCTZ 25 mg po daily, Losartan 100 mg p.o. daily with breakfast, and Metoprolol 100 mg po daily.  3) Lipids/Hyperlipidemia:    Review of her recent lipid panel from 07/31/22 showed controlled LDL at 88 and elevated triglycerides of 180 (improved).  She prefers to stay away from statins at this time.  She is advised to avoid fried foods and butter.    4)  Weight/Diet:  her Body mass index  is 32.95 kg/m.  -  clearly complicating her diabetes care.   she is a candidate for weight loss. I discussed with her the fact that loss of 5 - 10% of her  current body weight will have the most impact on her diabetes management.  Exercise, and detailed carbohydrates information provided  -  detailed on discharge instructions.  5) Vitamin D Deficiency Her recent vitamin D level was 36.2 on 12/11/22- checked by her oncologist. She is also undergoing evaluation for hypercalcemia with him as well.  6) Hypothyroidism-unspecified There are no recent TFTs to review.  She is advised to continue Levothyroxine 125 mcg po daily before breakfast.     - The correct intake of thyroid hormone (Levothyroxine, Synthroid), is on empty stomach first thing in the morning, with water, separated by at least 30 minutes  from breakfast and other medications,  and separated by more than 4 hours from calcium, iron, multivitamins, acid reflux medications (PPIs).  - This medication is a life-long medication and will be needed to correct thyroid hormone imbalances for the rest of your life.  The dose may change from time to time, based on thyroid blood work.  - It is extremely important to be consistent taking this medication, near the same time each morning.  -AVOID TAKING PRODUCTS CONTAINING BIOTIN (commonly found in Hair, Skin, Nails vitamins) AS IT INTERFERES WITH THE VALIDITY OF THYROID FUNCTION BLOOD TESTS.  6) Chronic Care/Health Maintenance: -she is on ACEI/ARB medications and is encouraged to initiate and continue to follow up with Ophthalmology, Dentist, Podiatrist at least yearly or according to recommendations, and advised to stay away from smoking. I have recommended yearly flu vaccine and pneumonia vaccine at least every 5 years; moderate intensity exercise for up to 150 minutes weekly; and sleep for at least 7 hours a day.  - she is advised to maintain close follow up with Tommie Sams, DO for primary care needs, as well as her other providers for optimal and coordinated care.      I spent  48  minutes in the care of the patient today including review of labs from CMP, Lipids, Thyroid Function, Hematology (current and previous including abstractions from other facilities); face-to-face time discussing  her blood glucose readings/logs, discussing hypoglycemia and hyperglycemia episodes and symptoms, medications doses, her options of short and long term treatment based on the latest standards of care / guidelines;  discussion about incorporating lifestyle medicine;  and documenting the encounter. Risk reduction counseling performed per USPSTF guidelines to reduce obesity and cardiovascular risk factors.     Please refer to Patient Instructions for Blood Glucose Monitoring and Insulin/Medications Dosing  Guide"  in media tab for additional information. Please  also refer to " Patient Self Inventory" in the Media  tab for reviewed elements of pertinent patient history.  Kimberly [name] participated in the discussions, expressed understanding, and voiced agreement with the above plans.  All questions were answered to her satisfaction. she is encouraged to contact clinic should she have any questions or concerns prior to her return visit.     Follow up plan: - Return in about 4 months (around 04/26/2023) for Diabetes F/U with A1c in office, No previsit labs, Bring meter and logs.   Ronny Bacon, [facility] [address] [place], [place] [address] Phone: [phone] Fax: [phone]  12/25/2022, 10:33 AM

## 2022-12-28 ENCOUNTER — Ambulatory Visit (HOSPITAL_COMMUNITY)
Admission: RE | Admit: 2022-12-28 | Discharge: 2022-12-28 | Disposition: A | Discharge status: Home / Self Care | Payer: Medicare Other | Source: Ambulatory Visit | Attending: Hematology | Admitting: Hematology

## 2022-12-28 ENCOUNTER — Inpatient Hospital Stay: Payer: Medicare Other | Attending: Hematology

## 2022-12-28 ENCOUNTER — Encounter (HOSPITAL_COMMUNITY): Payer: Self-pay

## 2022-12-28 DIAGNOSIS — Z803 Family history of malignant neoplasm of breast: Secondary | ICD-10-CM | POA: Insufficient documentation

## 2022-12-28 DIAGNOSIS — Z8042 Family history of malignant neoplasm of prostate: Secondary | ICD-10-CM | POA: Diagnosis not present

## 2022-12-28 DIAGNOSIS — Z853 Personal history of malignant neoplasm of breast: Secondary | ICD-10-CM | POA: Diagnosis not present

## 2022-12-28 DIAGNOSIS — Z9011 Acquired absence of right breast and nipple: Secondary | ICD-10-CM | POA: Diagnosis not present

## 2022-12-28 DIAGNOSIS — N644 Mastodynia: Secondary | ICD-10-CM | POA: Diagnosis not present

## 2022-12-28 DIAGNOSIS — C50411 Malignant neoplasm of upper-outer quadrant of right female breast: Secondary | ICD-10-CM | POA: Diagnosis not present

## 2022-12-28 DIAGNOSIS — Z800 Family history of malignant neoplasm of digestive organs: Secondary | ICD-10-CM | POA: Diagnosis not present

## 2022-12-28 DIAGNOSIS — Z1231 Encounter for screening mammogram for malignant neoplasm of breast: Secondary | ICD-10-CM | POA: Insufficient documentation

## 2022-12-28 DIAGNOSIS — Z170 Estrogen receptor positive status [ER+]: Secondary | ICD-10-CM | POA: Insufficient documentation

## 2022-12-28 DIAGNOSIS — Z79811 Long term (current) use of aromatase inhibitors: Secondary | ICD-10-CM | POA: Insufficient documentation

## 2022-12-28 LAB — BASIC METABOLIC PANEL
Anion gap: 11 (ref 5–15)
BUN: 25 mg/dL — ABNORMAL HIGH (ref 8–23)
CO2: 25 mmol/L (ref 22–32)
Calcium: 10.3 mg/dL (ref 8.9–10.3)
Chloride: 97 mmol/L — ABNORMAL LOW (ref 98–111)
Creatinine, Ser: 1.11 mg/dL — ABNORMAL HIGH (ref 0.44–1.00)
GFR, Estimated: 53 mL/min — ABNORMAL LOW (ref >60)
Glucose, Bld: 170 mg/dL — ABNORMAL HIGH (ref 70–99)
Potassium: 3.4 mmol/L — ABNORMAL LOW (ref 3.5–5.1)
Sodium: 133 mmol/L — ABNORMAL LOW (ref 135–145)

## 2022-12-31 LAB — PTH, INTACT AND CALCIUM
Calcium, Total (PTH): 10.9 mg/dL — ABNORMAL HIGH (ref 8.7–10.3)
PTH: 5 pg/mL — ABNORMAL LOW (ref 15–65)

## 2023-01-04 ENCOUNTER — Inpatient Hospital Stay (HOSPITAL_BASED_OUTPATIENT_CLINIC_OR_DEPARTMENT_OTHER): Payer: Medicare Other | Admitting: Physician Assistant

## 2023-01-04 DIAGNOSIS — C50411 Malignant neoplasm of upper-outer quadrant of right female breast: Secondary | ICD-10-CM | POA: Diagnosis not present

## 2023-01-04 DIAGNOSIS — Z79811 Long term (current) use of aromatase inhibitors: Secondary | ICD-10-CM | POA: Diagnosis not present

## 2023-01-04 DIAGNOSIS — Z170 Estrogen receptor positive status [ER+]: Secondary | ICD-10-CM | POA: Diagnosis not present

## 2023-01-04 DIAGNOSIS — Z9011 Acquired absence of right breast and nipple: Secondary | ICD-10-CM | POA: Diagnosis not present

## 2023-01-04 DIAGNOSIS — Z803 Family history of malignant neoplasm of breast: Secondary | ICD-10-CM | POA: Diagnosis not present

## 2023-01-04 NOTE — Progress Notes (Signed)
[facility] [address], [place] [address]    Clinic Day:  01/04/2023  Referring physician: Tommie Sams, DO  Patient Care Team: Tommie Sams, DO as PCP - General (Family Medicine) Doreatha Massed, MD as Medical Oncologist (Medical Oncology)  CHIEF COMPLAINT:   Diagnosis: right breast cancer    Cancer Staging  Breast cancer of upper-outer quadrant of right [name] breast [facility]) Staging form: Breast, AJCC 8th Edition - Clinical stage from 08/31/2021: Stage IIA (cT1c, cN1, cM0, G2, ER+, PR-, HER2-) - Unsigned    Prior Therapy: right breast mastectomy with lymph node biopsy on 07/29/2021   Current Therapy:  anastrozole   HISTORY OF PRESENT ILLNESS:   Oncology History  Breast cancer of upper-outer quadrant of right [name] breast (HCC)  08/31/2021 Initial Diagnosis   Breast cancer of upper-outer quadrant of right [name] breast (HCC)    Genetic Testing   Negative genetic testing. No pathogenic variants identified on the [facility] Multi-Cancer+RNA panel. VUS in [facility] called c.1022G>A identified. The report date is 10/24/2021.  The Multi-Cancer Panel + RNA offered by [facility] includes sequencing and/or deletion duplication testing of the following 84 genes: AIP, ALK, APC, ATM, AXIN2,BAP1,  BARD1, BLM, BMPR1A, BRCA1, BRCA2, BRIP1, CASR, CDC73, CDH1, CDK4, CDKN1B, CDKN1C, CDKN2A (p14ARF), CDKN2A (p16INK4a), CEBPA, CHEK2, CTNNA1, DICER1, DIS3L2, EGFR (c.2369C>T, p.Thr790Met variant only), EPCAM (Deletion/duplication testing only), FH, FLCN, GATA2, GPC3, GREM1 (Promoter region deletion/duplication testing only), HOXB13 (c.251G>A, p.Gly84Glu), HRAS, KIT, MAX, MEN1, MET, MITF (c.952G>A, p.Glu318Lys variant only), MLH1, MSH2, MSH3, MSH6, MUTYH, NBN, NF1, NF2, NTHL1, PALB2, PDGFRA, PHOX2B, PMS2, POLD1, POLE, POT1, PRKAR1A, PTCH1, PTEN, RAD50, RAD51C, RAD51D, RB1, RECQL4, RET, RUNX1, SDHAF2, SDHA (sequence changes only), SDHB, SDHC, SDHD, SMAD4, SMARCA4, SMARCB1, SMARCE1, STK11,  SUFU, TERC, TERT, TMEM127, TP53, TSC1, TSC2, VHL, WRN and WT1.      INTERVAL HISTORY:   Kimberly [name] is a 72 y.o. [name] presenting to clinic today for follow up of right breast cancer. She was last seen by Dr. Ellin Saba on 12/19/2022. She presents today to review mammogram results. She is unaccompanied for this visit.   Today, she states that she is doing well overall. She still has similar left breast pain without any palpable masses. Her energy and appetite are stable. She denies nausea, vomiting or abdominal pain. Her bowel habits are unchanged. She denies easy bruising or bleeding. She denies fevers, chills, sweats, shortness of breath, chest pain or cough. She has no other complaints. Rest of the ROS is below.   PAST MEDICAL HISTORY:   Past Medical History: Past Medical History:  Diagnosis Date   Anxiety    Arthritis    Depression    Diabetes mellitus    Family history of breast cancer    Family history of colon cancer    Family history of prostate cancer    Family history of skin cancer    GERD (gastroesophageal reflux disease)    High blood pressure    Hypothyroid    Obesity    PONV (postoperative nausea and vomiting)     Surgical History: Past Surgical History:  Procedure Laterality Date   ABDOMINAL HYSTERECTOMY     Bunions Right    CARPAL TUNNEL RELEASE Right    KNEE ARTHROSCOPY Right    MASTECTOMY MODIFIED RADICAL Right 07/29/2021   invasive ductal ca   THYROID SURGERY     removal   TOTAL KNEE ARTHROPLASTY Right 08/25/2013   Procedure: TOTAL KNEE ARTHROPLASTY;  Surgeon: Vickki Hearing, MD;  Location: [facility];  Service: Orthopedics;  Laterality: Right;   TOTAL KNEE ARTHROPLASTY Left 09/09/2014   Procedure: LEFT TOTAL KNEE ARTHROPLASTY;  Surgeon: Vickki Hearing, MD;  Location: [facility];  Service: Orthopedics;  Laterality: Left;    Social History: Social History   Socioeconomic History   Marital status: Married    Spouse name: Not on file   Number of  children: 5   Years of education: Not on file   Highest education level: Not on file  Occupational History   Occupation: CNA    Employer: [employer]  Tobacco Use   Smoking status: Never   Smokeless tobacco: Never  Vaping Use   Vaping Use: Never used  Substance and Sexual Activity   Alcohol use: No   Drug use: No   Sexual activity: Yes    Birth control/protection: Surgical  Other Topics Concern   Not on file  Social History Narrative   Not on file   Social Determinants of Health   Financial Resource Strain: Low Risk  (02/07/2022)   Overall Financial Resource Strain (CARDIA)    Difficulty of Paying Living Expenses: Not hard at all  Food Insecurity: No Food Insecurity (02/07/2022)   Hunger Vital Sign    Worried About Running Out of Food in the Last Year: Never true    Ran Out of Food in the Last Year: Never true  Transportation Needs: No Transportation Needs (02/07/2022)   PRAPARE - Administrator, Civil Service (Medical): No    Lack of Transportation (Non-Medical): No  Physical Activity: Insufficiently Active (02/07/2022)   Exercise Vital Sign    Days of Exercise per Week: 2 days    Minutes of Exercise per Session: 60 min  Stress: No Stress Concern Present (02/07/2022)   [employer] of Occupational Health - Occupational Stress Questionnaire    Feeling of Stress : Only a little  Social Connections: Socially Integrated (02/07/2022)   Social Connection and Isolation Panel [NHANES]    Frequency of Communication with Friends and Family: More than three times a week    Frequency of Social Gatherings with Friends and Family: More than three times a week    Attends Religious Services: More than 4 times per year    Active Member of [employer] or Organizations: Yes    Attends Engineer, structural: More than 4 times per year    Marital Status: Married  Catering manager Violence: Not At Risk (02/07/2022)   Humiliation, Afraid, Rape, and Kick questionnaire    Fear  of Current or Ex-Partner: No    Emotionally Abused: No    Physically Abused: No    Sexually Abused: No    Family History: Family History  Problem Relation Age of Onset   Diabetes Mother    Hypertension Mother    Congestive Heart Failure Father    Hypertension Father    Skin cancer Father 37       squamous cell on lip; metastatic   Hypertension Brother    Skin cancer Brother        squamous cell   Breast cancer Paternal Aunt        dx 41s d. 56s   Prostate cancer Paternal Uncle        dx 44s   Colon cancer Maternal Grandmother        dx 59s d. 16s   Diabetes Other    Cancer Cousin        unk type; possibly pancreatic  Current Medications:  Current Outpatient Medications:    anastrozole (ARIMIDEX) 1 MG tablet, TAKE 1 TABLET DAILY, Disp: 90 tablet, Rfl: 3   Calcium-Vitamin D 500-3.125 MG-MCG TABS, Take by mouth. Takes 2 daily, Disp: , Rfl:    Continuous Blood Gluc Sensor (DEXCOM G7 SENSOR) MISC, by Does not apply route., Disp: , Rfl:    diclofenac Sodium (VOLTAREN) 1 % GEL, Apply 2 g topically daily as needed (knee pain)., Disp: , Rfl:    famotidine (PEPCID) 40 MG tablet, TAKE 1 TABLET DAILY, Disp: 90 tablet, Rfl: 0   gabapentin (NEURONTIN) 100 MG capsule, Take 1 capsule (100 mg total) by mouth 3 (three) times daily., Disp: 270 capsule, Rfl: 1   glipiZIDE (GLUCOTROL XL) 5 MG 24 hr tablet, Take 1 tablet (5 mg total) by mouth daily with breakfast., Disp: 90 tablet, Rfl: 3   hydrochlorothiazide (HYDRODIURIL) 25 MG tablet, TAKE 1 TABLET DAILY, Disp: 90 tablet, Rfl: 0   insulin degludec (TRESIBA FLEXTOUCH) 100 UNIT/ML FlexTouch Pen, Inject 28 Units into the skin at bedtime., Disp: 28 mL, Rfl: 3   Insulin Pen Needle (B-D ULTRAFINE III SHORT PEN) 31G X 8 MM MISC, 1 each by Does not apply route as directed. Use to inject insulin once daily, Disp: 100 each, Rfl: 3   levothyroxine (SYNTHROID) 125 MCG tablet, Take 1 tablet (125 mcg total) by mouth daily before breakfast., Disp: 90  tablet, Rfl: 3   losartan (COZAAR) 100 MG tablet, TAKE 1 TABLET EVERY MORNING, Disp: 90 tablet, Rfl: 0   Magnesium 250 MG TABS, Take 250 mg by mouth daily., Disp: , Rfl:    metFORMIN (GLUCOPHAGE-XR) 500 MG 24 hr tablet, Take 1 tablet (500 mg total) by mouth 2 (two) times daily with a meal., Disp: 180 tablet, Rfl: 3   metoprolol succinate (TOPROL-XL) 100 MG 24 hr tablet, TAKE 1 TABLET AT BEDTIME   WITH OR IMMEDIATELY        FOLLOWING A MEAL, Disp: 90 tablet, Rfl: 0   naproxen (NAPROSYN) 500 MG tablet, Take 1 tablet (500 mg total) by mouth 2 (two) times daily with a meal., Disp: 28 tablet, Rfl: 0   omeprazole (PRILOSEC) 20 MG capsule, TAKE 1 CAPSULE AT BEDTIME, Disp: 90 capsule, Rfl: 0   tolterodine (DETROL LA) 4 MG 24 hr capsule, Take 1 capsule (4 mg total) by mouth daily., Disp: 90 capsule, Rfl: 3   ULTICARE MINI PEN NEEDLES 31G X 6 MM MISC, USE ONCE DAILY AS DIRECTED., Disp: 100 each, Rfl: 3   venlafaxine XR (EFFEXOR-XR) 75 MG 24 hr capsule, TAKE 1 CAPSULE BY MOUTH ONCE DAILY WITH BREAKFAST, Disp: 90 capsule, Rfl: 1 No current facility-administered medications for this visit.  Facility-Administered Medications Ordered in Other Visits:    bupivacaine liposome (EXPAREL) 1.3 % injection 266 mg, 20 mL, Infiltration, Once, Vickki Hearing, MD   Allergies: Allergies  Allergen Reactions   Ace Inhibitors Cough    REVIEW OF SYSTEMS:   Review of Systems  Constitutional:  Negative for chills, fatigue and fever.  Eyes:  Negative for eye problems.  Respiratory:  Negative for cough and shortness of breath.   Cardiovascular:  Negative for chest pain, leg swelling and palpitations.  Gastrointestinal:  Negative for abdominal pain, constipation, diarrhea, nausea and vomiting.  Skin:  Negative for rash.  Neurological:  Positive for numbness. Negative for dizziness and headaches.  Hematological:  Does not bruise/bleed easily.  Psychiatric/Behavioral:  The patient is not nervous/anxious.   All other  systems reviewed and  are negative.    VITALS:   There were no vitals taken for this visit.  Wt Readings from Last 3 Encounters:  12/25/22 189 lb (85.7 kg)  12/19/22 190 lb 12.8 oz (86.5 kg)  08/24/22 193 lb (87.5 kg)    There is no height or weight on file to calculate BMI.  Performance status (ECOG): 1 - Symptomatic but completely ambulatory  PHYSICAL EXAM:   Physical Exam Vitals and nursing note reviewed. Exam conducted with a chaperone present.  Constitutional:      General: She is not in acute distress.    Appearance: Normal appearance.  Cardiovascular:     Rate and Rhythm: Normal rate and regular rhythm.     Heart sounds: Normal heart sounds.  Pulmonary:     Effort: Pulmonary effort is normal.     Breath sounds: Normal breath sounds.  Abdominal:     Palpations: There is no hepatomegaly or splenomegaly.  Musculoskeletal:     Right lower leg: No edema.     Left lower leg: No edema.  Neurological:     General: No focal deficit present.     Mental Status: She is alert and oriented to person, place, and time.  Psychiatric:        Mood and Affect: Mood normal.        Behavior: Behavior normal.     LABS:      Latest Ref Rng & Units 12/11/2022    2:08 PM 06/05/2022    9:40 AM 01/09/2022   11:21 AM  CBC  WBC 4.0 - 10.5 K/uL 7.8  7.1  7.9   Hemoglobin 12.0 - 15.0 g/dL 16.1  09.6  04.5   Hematocrit 36.0 - 46.0 % 34.6  35.2  33.9   Platelets 150 - 400 K/uL 344  329  301       Latest Ref Rng & Units 12/28/2022    2:52 PM 12/11/2022    2:08 PM 06/05/2022    9:40 AM  CMP  Glucose 70 - 99 mg/dL 409  811  914   BUN 8 - 23 mg/dL 25  25  20    Creatinine 0.44 - 1.00 mg/dL 7.82  9.56  2.13   Sodium 135 - 145 mmol/L 133  135  141   Potassium 3.5 - 5.1 mmol/L 3.4  3.7  4.1   Chloride 98 - 111 mmol/L 97  101  105   CO2 22 - 32 mmol/L 25  26  28    Calcium 8.9 - 10.3 mg/dL 8.7 - 08.6 mg/dL 57.8    46.9  62.9  52.8   Total Protein 6.5 - 8.1 g/dL  7.7  7.6   Total Bilirubin  0.3 - 1.2 mg/dL  0.4  0.7   Alkaline Phos 38 - 126 U/L  97  73   AST 15 - 41 U/L  20  17   ALT 0 - 44 U/L  19  18      No results found for: "CEA1", "CEA" / No results found for: "CEA1", "CEA" No results found for: "PSA1" No results found for: "UXL244" No results found for: "CAN125"  No results found for: "TOTALPROTELP", "ALBUMINELP", "A1GS", "A2GS", "BETS", "BETA2SER", "GAMS", "MSPIKE", "SPEI" Lab Results  Component Value Date   TIBC 365 09/05/2021   TIBC 314 07/26/2015   FERRITIN 73 09/05/2021   FERRITIN 293 (H) 07/26/2015   IRONPCTSAT 20 09/05/2021   IRONPCTSAT 29 07/26/2015   No results found for: "LDH"   STUDIES:  MM DIAG BREAST TOMO UNI LEFT  Result Date: 12/28/2022 CLINICAL DATA:  72 year old [name] with history of right breast cancer post mastectomy 2022 presents with nonfocal tenderness involving the left breast. EXAM: DIGITAL DIAGNOSTIC UNILATERAL LEFT MAMMOGRAM WITH TOMOSYNTHESIS TECHNIQUE: Left digital diagnostic mammography and breast tomosynthesis was performed. COMPARISON:  Previous exam(s). ACR Breast Density Category b: There are scattered areas of fibroglandular density. FINDINGS: No suspicious masses or calcifications are seen in the left breast. There is no mammographic evidence of malignancy in the left breast. IMPRESSION: No mammographic evidence of malignancy in the left breast. RECOMMENDATION: 1. Recommend further management of nonfocal left breast pain be based on clinical assessment. 2.  Screening mammogram in one year.(Code:SM-B-01Y) I have discussed the findings and recommendations with the patient. If applicable, a reminder letter will be sent to the patient regarding the next appointment. BI-RADS CATEGORY  1: Negative. Electronically Signed   By: Edwin Cap M.D.   On: 12/28/2022 14:49       ASSESSMENT & PLAN:   Assessment: 1. Stage IIa (T1CN1) right breast upper outer quadrant IDC: - Abnormal mammogram on 06/02/2021. - Right breast 9:00 mass  biopsy on 06/30/2021, invasive ductal carcinoma, ER 70% positive, PR negative, Ki-67 2%, HER2 1+.  Right axillary lymph node biopsy was positive for metastatic carcinoma. - She met with Dr. Ulice Bold for possible reconstruction but decided against it. - She underwent right breast mastectomy with lymph node biopsy on 07/29/2021. - Pathology showed 1.1 cm grade 2 IDC, associated DCIS, margins negative.  Metastatic carcinoma involving 1/9 lymph nodes.  PT1CPN1A. - Oncotype DX recurrence score 22.  Anastrozole started in February 2023.   2. Social/family history: - She lives at home with her husband.  Today she is seen with her daughter. 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  Non-smoker. - Father had squamous cell carcinoma of the lip which has metastasized.  Maternal grandmother had colon cancer.  Paternal uncle had prostate cancer and paternal aunt had breast cancer.    Plan: 1. Stage IIa (T1CN1) right breast IDC, ER positive, PR/HER2 negative: - She is tolerating anastrozole very well.  Minor hot flashes and arthralgias are stable. Continue anastrozole therapy.  - Mammogram of left breast was obtained on 12/28/2022 due to sharp breast pain.Findings showed no evidence of malignancy. Next mammogram due in one year around May 2025.  --RTC in 6 months with labs and follow up.   2.  Bone health (DEXA scan 09/12/2021 T score -0.5): - She is not on calcium or vitamin D supplements.   --Vitamin D was 36 and calcium level was elevated to 10.5 for the first time on 12/11/2022 - Repeat labs from 12/28/2022 showed calcium level normalized to 10.3 in BNP, PTH was low due to history of parathyroidectomy.Vitamin D level is pending from 12/28/2022. Monitor for now.   Patient expressed understanding of the plan provided.   I have spent a total of 25 minutes minutes of face-to-face and non-face-to-face time, preparing to see the patient,performing a medically appropriate examination,  counseling and educating the patient, documenting clinical information in the electronic health record, and care coordination.   Georga Kaufmann PA-C Dept of Hematology and Oncology [facility]

## 2023-01-08 LAB — VITAMIN D 1,25 DIHYDROXY
Vitamin D 1, 25 (OH)2 Total: 66 pg/mL — ABNORMAL HIGH
Vitamin D2 1, 25 (OH)2: <10 pg/mL
Vitamin D3 1, 25 (OH)2: 61 pg/mL

## 2023-01-15 ENCOUNTER — Encounter (HOSPITAL_COMMUNITY): Payer: Self-pay

## 2023-01-15 ENCOUNTER — Emergency Department (HOSPITAL_COMMUNITY)
Admission: EM | Admit: 2023-01-15 | Discharge: 2023-01-16 | Disposition: A | Discharge status: Home / Self Care | Payer: Medicare Other | Source: Home / Self Care | Attending: Emergency Medicine | Admitting: Emergency Medicine

## 2023-01-15 ENCOUNTER — Emergency Department (HOSPITAL_COMMUNITY): Payer: Medicare Other

## 2023-01-15 ENCOUNTER — Other Ambulatory Visit: Payer: Self-pay

## 2023-01-15 DIAGNOSIS — I10 Essential (primary) hypertension: Secondary | ICD-10-CM | POA: Insufficient documentation

## 2023-01-15 DIAGNOSIS — E039 Hypothyroidism, unspecified: Secondary | ICD-10-CM | POA: Diagnosis not present

## 2023-01-15 DIAGNOSIS — Z794 Long term (current) use of insulin: Secondary | ICD-10-CM | POA: Diagnosis not present

## 2023-01-15 DIAGNOSIS — N1831 Chronic kidney disease, stage 3a: Secondary | ICD-10-CM | POA: Diagnosis not present

## 2023-01-15 DIAGNOSIS — Z96653 Presence of artificial knee joint, bilateral: Secondary | ICD-10-CM | POA: Diagnosis present

## 2023-01-15 DIAGNOSIS — E119 Type 2 diabetes mellitus without complications: Secondary | ICD-10-CM | POA: Insufficient documentation

## 2023-01-15 DIAGNOSIS — Z8249 Family history of ischemic heart disease and other diseases of the circulatory system: Secondary | ICD-10-CM | POA: Diagnosis not present

## 2023-01-15 DIAGNOSIS — I700 Atherosclerosis of aorta: Secondary | ICD-10-CM | POA: Diagnosis not present

## 2023-01-15 DIAGNOSIS — W19XXXA Unspecified fall, initial encounter: Secondary | ICD-10-CM | POA: Diagnosis present

## 2023-01-15 DIAGNOSIS — Z803 Family history of malignant neoplasm of breast: Secondary | ICD-10-CM | POA: Diagnosis not present

## 2023-01-15 DIAGNOSIS — R55 Syncope and collapse: Secondary | ICD-10-CM | POA: Insufficient documentation

## 2023-01-15 DIAGNOSIS — R509 Fever, unspecified: Secondary | ICD-10-CM | POA: Diagnosis not present

## 2023-01-15 DIAGNOSIS — N179 Acute kidney failure, unspecified: Secondary | ICD-10-CM | POA: Diagnosis present

## 2023-01-15 DIAGNOSIS — R5081 Fever presenting with conditions classified elsewhere: Secondary | ICD-10-CM | POA: Diagnosis not present

## 2023-01-15 DIAGNOSIS — Z1152 Encounter for screening for COVID-19: Secondary | ICD-10-CM | POA: Diagnosis not present

## 2023-01-15 DIAGNOSIS — Z90710 Acquired absence of both cervix and uterus: Secondary | ICD-10-CM | POA: Diagnosis not present

## 2023-01-15 DIAGNOSIS — Z853 Personal history of malignant neoplasm of breast: Secondary | ICD-10-CM | POA: Diagnosis not present

## 2023-01-15 DIAGNOSIS — Z7984 Long term (current) use of oral hypoglycemic drugs: Secondary | ICD-10-CM | POA: Insufficient documentation

## 2023-01-15 DIAGNOSIS — R109 Unspecified abdominal pain: Secondary | ICD-10-CM | POA: Diagnosis not present

## 2023-01-15 DIAGNOSIS — I959 Hypotension, unspecified: Secondary | ICD-10-CM | POA: Diagnosis not present

## 2023-01-15 DIAGNOSIS — N12 Tubulo-interstitial nephritis, not specified as acute or chronic: Secondary | ICD-10-CM | POA: Insufficient documentation

## 2023-01-15 DIAGNOSIS — E1122 Type 2 diabetes mellitus with diabetic chronic kidney disease: Secondary | ICD-10-CM | POA: Diagnosis not present

## 2023-01-15 DIAGNOSIS — N390 Urinary tract infection, site not specified: Secondary | ICD-10-CM | POA: Diagnosis present

## 2023-01-15 DIAGNOSIS — R519 Headache, unspecified: Secondary | ICD-10-CM | POA: Diagnosis not present

## 2023-01-15 DIAGNOSIS — Z6833 Body mass index (BMI) 33.0-33.9, adult: Secondary | ICD-10-CM | POA: Diagnosis not present

## 2023-01-15 DIAGNOSIS — Z79899 Other long term (current) drug therapy: Secondary | ICD-10-CM | POA: Insufficient documentation

## 2023-01-15 DIAGNOSIS — N3000 Acute cystitis without hematuria: Secondary | ICD-10-CM | POA: Diagnosis not present

## 2023-01-15 DIAGNOSIS — G9341 Metabolic encephalopathy: Secondary | ICD-10-CM | POA: Diagnosis present

## 2023-01-15 DIAGNOSIS — K219 Gastro-esophageal reflux disease without esophagitis: Secondary | ICD-10-CM | POA: Diagnosis present

## 2023-01-15 DIAGNOSIS — G9340 Encephalopathy, unspecified: Secondary | ICD-10-CM | POA: Diagnosis not present

## 2023-01-15 DIAGNOSIS — E876 Hypokalemia: Secondary | ICD-10-CM | POA: Diagnosis present

## 2023-01-15 DIAGNOSIS — R059 Cough, unspecified: Secondary | ICD-10-CM | POA: Diagnosis not present

## 2023-01-15 DIAGNOSIS — R410 Disorientation, unspecified: Secondary | ICD-10-CM | POA: Diagnosis not present

## 2023-01-15 DIAGNOSIS — E782 Mixed hyperlipidemia: Secondary | ICD-10-CM | POA: Diagnosis present

## 2023-01-15 DIAGNOSIS — M47812 Spondylosis without myelopathy or radiculopathy, cervical region: Secondary | ICD-10-CM | POA: Diagnosis not present

## 2023-01-15 DIAGNOSIS — E669 Obesity, unspecified: Secondary | ICD-10-CM | POA: Diagnosis present

## 2023-01-15 DIAGNOSIS — Z9011 Acquired absence of right breast and nipple: Secondary | ICD-10-CM | POA: Diagnosis not present

## 2023-01-15 DIAGNOSIS — Z79890 Hormone replacement therapy (postmenopausal): Secondary | ICD-10-CM | POA: Diagnosis not present

## 2023-01-15 DIAGNOSIS — E871 Hypo-osmolality and hyponatremia: Secondary | ICD-10-CM | POA: Diagnosis present

## 2023-01-15 DIAGNOSIS — E1129 Type 2 diabetes mellitus with other diabetic kidney complication: Secondary | ICD-10-CM | POA: Diagnosis present

## 2023-01-15 DIAGNOSIS — R110 Nausea: Secondary | ICD-10-CM | POA: Diagnosis not present

## 2023-01-15 LAB — BASIC METABOLIC PANEL
Anion gap: 10 (ref 5–15)
BUN: 16 mg/dL (ref 8–23)
CO2: 23 mmol/L (ref 22–32)
Calcium: 8.8 mg/dL — ABNORMAL LOW (ref 8.9–10.3)
Chloride: 97 mmol/L — ABNORMAL LOW (ref 98–111)
Creatinine, Ser: 1 mg/dL (ref 0.44–1.00)
GFR, Estimated: >60 mL/min (ref >60)
Glucose, Bld: 143 mg/dL — ABNORMAL HIGH (ref 70–99)
Potassium: 3.4 mmol/L — ABNORMAL LOW (ref 3.5–5.1)
Sodium: 130 mmol/L — ABNORMAL LOW (ref 135–145)

## 2023-01-15 LAB — URINALYSIS, ROUTINE W REFLEX MICROSCOPIC
Bilirubin Urine: NEGATIVE
Glucose, UA: NEGATIVE mg/dL
Hgb urine dipstick: NEGATIVE
Ketones, ur: NEGATIVE mg/dL
Nitrite: NEGATIVE
Protein, ur: 30 mg/dL — AB
Specific Gravity, Urine: 1.018 (ref 1.005–1.030)
pH: 6 (ref 5.0–8.0)

## 2023-01-15 LAB — CBC
HCT: 32.2 % — ABNORMAL LOW (ref 36.0–46.0)
Hemoglobin: 11.1 g/dL — ABNORMAL LOW (ref 12.0–15.0)
MCH: 32 pg (ref 26.0–34.0)
MCHC: 34.5 g/dL (ref 30.0–36.0)
MCV: 92.8 fL (ref 80.0–100.0)
Platelets: 233 10*3/uL (ref 150–400)
RBC: 3.47 MIL/uL — ABNORMAL LOW (ref 3.87–5.11)
RDW: 12.2 % (ref 11.5–15.5)
WBC: 4.9 10*3/uL (ref 4.0–10.5)
nRBC: 0 % (ref 0.0–0.2)

## 2023-01-15 MED ORDER — IOHEXOL 300 MG/ML  SOLN
100.0000 mL | Freq: Once | INTRAMUSCULAR | Status: AC | PRN
  Administered 2023-01-15: 100 mL via INTRAVENOUS

## 2023-01-15 MED ORDER — SODIUM CHLORIDE 0.9 % IV BOLUS
1000.0000 mL | Freq: Once | INTRAVENOUS | Status: AC
  Administered 2023-01-15: 1000 mL via INTRAVENOUS

## 2023-01-15 MED ORDER — ACETAMINOPHEN 325 MG PO TABS
650.0000 mg | ORAL_TABLET | Freq: Once | ORAL | Status: AC
  Administered 2023-01-15: 650 mg via ORAL
  Filled 2023-01-15: qty 2

## 2023-01-15 NOTE — ED Notes (Addendum)
EKG given to EDP and notified of patient placement in [place] to be seen.

## 2023-01-15 NOTE — ED Triage Notes (Addendum)
[facility] cc of near loc. Has been feeling unwell for 36 hours. Might have gotten up too fast per ems and patient because she was lying on the couch all day and then stood up.  20g l hand ns and 4mg  zofran.

## 2023-01-16 ENCOUNTER — Emergency Department (HOSPITAL_COMMUNITY): Payer: Medicare Other

## 2023-01-16 ENCOUNTER — Encounter (HOSPITAL_COMMUNITY): Payer: Self-pay | Admitting: Internal Medicine

## 2023-01-16 ENCOUNTER — Inpatient Hospital Stay (HOSPITAL_COMMUNITY)
Admission: EM | Admit: 2023-01-16 | Discharge: 2023-01-19 | DRG: 689 | Disposition: A | Discharge status: Home / Self Care | Payer: Medicare Other | Attending: Internal Medicine | Admitting: Internal Medicine

## 2023-01-16 ENCOUNTER — Other Ambulatory Visit: Payer: Self-pay

## 2023-01-16 DIAGNOSIS — E039 Hypothyroidism, unspecified: Secondary | ICD-10-CM | POA: Diagnosis present

## 2023-01-16 DIAGNOSIS — Z794 Long term (current) use of insulin: Secondary | ICD-10-CM

## 2023-01-16 DIAGNOSIS — G9341 Metabolic encephalopathy: Secondary | ICD-10-CM | POA: Diagnosis present

## 2023-01-16 DIAGNOSIS — E871 Hypo-osmolality and hyponatremia: Secondary | ICD-10-CM | POA: Diagnosis present

## 2023-01-16 DIAGNOSIS — E876 Hypokalemia: Secondary | ICD-10-CM | POA: Diagnosis present

## 2023-01-16 DIAGNOSIS — Z7984 Long term (current) use of oral hypoglycemic drugs: Secondary | ICD-10-CM

## 2023-01-16 DIAGNOSIS — Z9011 Acquired absence of right breast and nipple: Secondary | ICD-10-CM | POA: Diagnosis not present

## 2023-01-16 DIAGNOSIS — R059 Cough, unspecified: Secondary | ICD-10-CM | POA: Diagnosis not present

## 2023-01-16 DIAGNOSIS — W19XXXA Unspecified fall, initial encounter: Secondary | ICD-10-CM | POA: Diagnosis present

## 2023-01-16 DIAGNOSIS — I10 Essential (primary) hypertension: Secondary | ICD-10-CM | POA: Diagnosis present

## 2023-01-16 DIAGNOSIS — Z1152 Encounter for screening for COVID-19: Secondary | ICD-10-CM

## 2023-01-16 DIAGNOSIS — R410 Disorientation, unspecified: Secondary | ICD-10-CM | POA: Diagnosis present

## 2023-01-16 DIAGNOSIS — G9340 Encephalopathy, unspecified: Secondary | ICD-10-CM | POA: Diagnosis not present

## 2023-01-16 DIAGNOSIS — K219 Gastro-esophageal reflux disease without esophagitis: Secondary | ICD-10-CM | POA: Diagnosis present

## 2023-01-16 DIAGNOSIS — Z8249 Family history of ischemic heart disease and other diseases of the circulatory system: Secondary | ICD-10-CM

## 2023-01-16 DIAGNOSIS — N3000 Acute cystitis without hematuria: Secondary | ICD-10-CM | POA: Diagnosis not present

## 2023-01-16 DIAGNOSIS — E669 Obesity, unspecified: Secondary | ICD-10-CM | POA: Diagnosis present

## 2023-01-16 DIAGNOSIS — R55 Syncope and collapse: Secondary | ICD-10-CM | POA: Diagnosis present

## 2023-01-16 DIAGNOSIS — Z79890 Hormone replacement therapy (postmenopausal): Secondary | ICD-10-CM | POA: Diagnosis not present

## 2023-01-16 DIAGNOSIS — Z90710 Acquired absence of both cervix and uterus: Secondary | ICD-10-CM | POA: Diagnosis not present

## 2023-01-16 DIAGNOSIS — Z833 Family history of diabetes mellitus: Secondary | ICD-10-CM

## 2023-01-16 DIAGNOSIS — Z6833 Body mass index (BMI) 33.0-33.9, adult: Secondary | ICD-10-CM

## 2023-01-16 DIAGNOSIS — R509 Fever, unspecified: Secondary | ICD-10-CM | POA: Diagnosis not present

## 2023-01-16 DIAGNOSIS — E782 Mixed hyperlipidemia: Secondary | ICD-10-CM | POA: Diagnosis present

## 2023-01-16 DIAGNOSIS — N179 Acute kidney failure, unspecified: Secondary | ICD-10-CM | POA: Diagnosis present

## 2023-01-16 DIAGNOSIS — Z853 Personal history of malignant neoplasm of breast: Secondary | ICD-10-CM

## 2023-01-16 DIAGNOSIS — G0490 Encephalitis and encephalomyelitis, unspecified: Secondary | ICD-10-CM | POA: Diagnosis present

## 2023-01-16 DIAGNOSIS — E1129 Type 2 diabetes mellitus with other diabetic kidney complication: Secondary | ICD-10-CM | POA: Diagnosis present

## 2023-01-16 DIAGNOSIS — N1831 Chronic kidney disease, stage 3a: Secondary | ICD-10-CM | POA: Diagnosis not present

## 2023-01-16 DIAGNOSIS — N390 Urinary tract infection, site not specified: Secondary | ICD-10-CM | POA: Diagnosis present

## 2023-01-16 DIAGNOSIS — Z96653 Presence of artificial knee joint, bilateral: Secondary | ICD-10-CM | POA: Diagnosis present

## 2023-01-16 DIAGNOSIS — Z803 Family history of malignant neoplasm of breast: Secondary | ICD-10-CM

## 2023-01-16 DIAGNOSIS — E1122 Type 2 diabetes mellitus with diabetic chronic kidney disease: Secondary | ICD-10-CM | POA: Diagnosis not present

## 2023-01-16 DIAGNOSIS — R5081 Fever presenting with conditions classified elsewhere: Secondary | ICD-10-CM | POA: Diagnosis not present

## 2023-01-16 DIAGNOSIS — Z79899 Other long term (current) drug therapy: Secondary | ICD-10-CM

## 2023-01-16 LAB — RESP PANEL BY RT-PCR (RSV, FLU A&B, COVID)  RVPGX2
Influenza A by PCR: NEGATIVE
Influenza B by PCR: NEGATIVE
Resp Syncytial Virus by PCR: NEGATIVE
SARS Coronavirus 2 by RT PCR: NEGATIVE

## 2023-01-16 LAB — COMPREHENSIVE METABOLIC PANEL
ALT: 32 U/L (ref 0–44)
AST: 41 U/L (ref 15–41)
Albumin: 3.9 g/dL (ref 3.5–5.0)
Alkaline Phosphatase: 68 U/L (ref 38–126)
Anion gap: 10 (ref 5–15)
BUN: 15 mg/dL (ref 8–23)
CO2: 26 mmol/L (ref 22–32)
Calcium: 9.3 mg/dL (ref 8.9–10.3)
Chloride: 92 mmol/L — ABNORMAL LOW (ref 98–111)
Creatinine, Ser: 1.16 mg/dL — ABNORMAL HIGH (ref 0.44–1.00)
GFR, Estimated: 50 mL/min — ABNORMAL LOW (ref >60)
Glucose, Bld: 106 mg/dL — ABNORMAL HIGH (ref 70–99)
Potassium: 3.6 mmol/L (ref 3.5–5.1)
Sodium: 128 mmol/L — ABNORMAL LOW (ref 135–145)
Total Bilirubin: 0.7 mg/dL (ref 0.3–1.2)
Total Protein: 7.6 g/dL (ref 6.5–8.1)

## 2023-01-16 LAB — CBC WITH DIFFERENTIAL/PLATELET
Abs Immature Granulocytes: 0.01 10*3/uL (ref 0.00–0.07)
Basophils Absolute: 0 10*3/uL (ref 0.0–0.1)
Basophils Relative: 1 %
Eosinophils Absolute: 0 10*3/uL (ref 0.0–0.5)
Eosinophils Relative: 1 %
HCT: 33.9 % — ABNORMAL LOW (ref 36.0–46.0)
Hemoglobin: 11.4 g/dL — ABNORMAL LOW (ref 12.0–15.0)
Immature Granulocytes: 0 %
Lymphocytes Relative: 15 %
Lymphs Abs: 0.6 10*3/uL — ABNORMAL LOW (ref 0.7–4.0)
MCH: 31.9 pg (ref 26.0–34.0)
MCHC: 33.6 g/dL (ref 30.0–36.0)
MCV: 95 fL (ref 80.0–100.0)
Monocytes Absolute: 0.6 10*3/uL (ref 0.1–1.0)
Monocytes Relative: 14 %
Neutro Abs: 3.1 10*3/uL (ref 1.7–7.7)
Neutrophils Relative %: 69 %
Platelets: 202 10*3/uL (ref 150–400)
RBC: 3.57 MIL/uL — ABNORMAL LOW (ref 3.87–5.11)
RDW: 12.3 % (ref 11.5–15.5)
WBC: 4.4 10*3/uL (ref 4.0–10.5)
nRBC: 0 % (ref 0.0–0.2)

## 2023-01-16 LAB — URINALYSIS, ROUTINE W REFLEX MICROSCOPIC
Bilirubin Urine: NEGATIVE
Glucose, UA: NEGATIVE mg/dL
Hgb urine dipstick: NEGATIVE
Ketones, ur: NEGATIVE mg/dL
Leukocytes,Ua: NEGATIVE
Nitrite: NEGATIVE
Specific Gravity, Urine: 1.015 (ref 1.005–1.030)
pH: 6.5 (ref 5.0–8.0)

## 2023-01-16 LAB — CSF CELL COUNT WITH DIFFERENTIAL
RBC Count, CSF: 0 /mm3
RBC Count, CSF: 23 /mm3 — ABNORMAL HIGH
Tube #: 1
Tube #: 4
WBC, CSF: 3 /mm3 (ref 0–5)
WBC, CSF: 3 /mm3 (ref 0–5)

## 2023-01-16 LAB — URINALYSIS, MICROSCOPIC (REFLEX)

## 2023-01-16 LAB — GLUCOSE, CAPILLARY: Glucose-Capillary: 124 mg/dL — ABNORMAL HIGH (ref 70–99)

## 2023-01-16 LAB — LACTIC ACID, PLASMA
Lactic Acid, Venous: 1.1 mmol/L (ref 0.5–1.9)
Lactic Acid, Venous: 1 mmol/L (ref 0.5–1.9)

## 2023-01-16 LAB — GLUCOSE, CSF: Glucose, CSF: 48 mg/dL (ref 40–70)

## 2023-01-16 LAB — PROTIME-INR
INR: 1.2 (ref 0.8–1.2)
Prothrombin Time: 15.8 seconds — ABNORMAL HIGH (ref 11.4–15.2)

## 2023-01-16 LAB — TSH: TSH: 0.49 u[IU]/mL (ref 0.350–4.500)

## 2023-01-16 LAB — BODY FLUID CULTURE W GRAM STAIN

## 2023-01-16 LAB — CBG MONITORING, ED
Glucose-Capillary: 105 mg/dL — ABNORMAL HIGH (ref 70–99)
Glucose-Capillary: 73 mg/dL (ref 70–99)

## 2023-01-16 LAB — PROTEIN, CSF: Total  Protein, CSF: 43 mg/dL (ref 15–45)

## 2023-01-16 MED ORDER — SODIUM CHLORIDE 0.9 % IV SOLN: INTRAVENOUS | Status: DC

## 2023-01-16 MED ORDER — DICLOFENAC SODIUM 1 % EX GEL: 2.0000 g | Freq: Every day | CUTANEOUS | Status: DC | PRN

## 2023-01-16 MED ORDER — SODIUM CHLORIDE 0.9 % IV SOLN
2.0000 g | Freq: Two times a day (BID) | INTRAVENOUS | Status: DC
  Administered 2023-01-17 – 2023-01-18 (×3): 2 g via INTRAVENOUS
  Filled 2023-01-16 (×3): qty 20

## 2023-01-16 MED ORDER — FENTANYL CITRATE PF 50 MCG/ML IJ SOSY
50.0000 ug | PREFILLED_SYRINGE | Freq: Once | INTRAMUSCULAR | Status: AC | PRN
  Administered 2023-01-16: 50 ug via INTRAVENOUS
  Filled 2023-01-16: qty 1

## 2023-01-16 MED ORDER — VANCOMYCIN HCL 1500 MG/300ML IV SOLN
1500.0000 mg | Freq: Once | INTRAVENOUS | Status: AC
  Administered 2023-01-16: 1500 mg via INTRAVENOUS
  Filled 2023-01-16: qty 300

## 2023-01-16 MED ORDER — SODIUM CHLORIDE 0.9 % IV SOLN
1.0000 g | Freq: Once | INTRAVENOUS | Status: AC
  Administered 2023-01-16: 1 g via INTRAVENOUS
  Filled 2023-01-16: qty 10

## 2023-01-16 MED ORDER — VANCOMYCIN HCL 750 MG/150ML IV SOLN
750.0000 mg | Freq: Two times a day (BID) | INTRAVENOUS | Status: DC
  Administered 2023-01-17 – 2023-01-19 (×5): 750 mg via INTRAVENOUS
  Filled 2023-01-16 (×5): qty 150

## 2023-01-16 MED ORDER — SENNA 8.6 MG PO TABS
1.0000 | ORAL_TABLET | Freq: Two times a day (BID) | ORAL | Status: DC
  Administered 2023-01-17 – 2023-01-19 (×6): 8.6 mg via ORAL
  Filled 2023-01-16 (×6): qty 1

## 2023-01-16 MED ORDER — SODIUM CHLORIDE 0.9 % IV BOLUS
1000.0000 mL | Freq: Once | INTRAVENOUS | Status: AC
  Administered 2023-01-16: 1000 mL via INTRAVENOUS

## 2023-01-16 MED ORDER — CEFDINIR 300 MG PO CAPS: 300.0000 mg | ORAL_CAPSULE | Freq: Two times a day (BID) | ORAL | 0 refills | Status: AC

## 2023-01-16 MED ORDER — ANASTROZOLE 1 MG PO TABS
1.0000 mg | ORAL_TABLET | Freq: Every day | ORAL | Status: DC
  Filled 2023-01-16 (×3): qty 1

## 2023-01-16 MED ORDER — INSULIN ASPART 100 UNIT/ML IJ SOLN
0.0000 [IU] | Freq: Three times a day (TID) | INTRAMUSCULAR | Status: DC
  Administered 2023-01-17: 3 [IU] via SUBCUTANEOUS
  Administered 2023-01-17: 2 [IU] via SUBCUTANEOUS
  Administered 2023-01-18: 3 [IU] via SUBCUTANEOUS
  Administered 2023-01-18: 5 [IU] via SUBCUTANEOUS
  Administered 2023-01-18 – 2023-01-19 (×3): 3 [IU] via SUBCUTANEOUS

## 2023-01-16 MED ORDER — METOPROLOL SUCCINATE ER 50 MG PO TB24
100.0000 mg | ORAL_TABLET | Freq: Every day | ORAL | Status: DC
  Administered 2023-01-16 – 2023-01-18 (×3): 100 mg via ORAL
  Filled 2023-01-16 (×3): qty 2

## 2023-01-16 MED ORDER — ONDANSETRON HCL 4 MG/2ML IJ SOLN
4.0000 mg | Freq: Once | INTRAMUSCULAR | Status: AC
  Administered 2023-01-16: 4 mg via INTRAVENOUS
  Filled 2023-01-16: qty 2

## 2023-01-16 MED ORDER — ACETAMINOPHEN 325 MG PO TABS: 650.0000 mg | ORAL_TABLET | Freq: Once | ORAL | Status: AC | PRN

## 2023-01-16 MED ORDER — MAGNESIUM OXIDE -MG SUPPLEMENT 400 (240 MG) MG PO TABS
200.0000 mg | ORAL_TABLET | Freq: Every day | ORAL | Status: DC
  Administered 2023-01-17 – 2023-01-19 (×3): 200 mg via ORAL
  Filled 2023-01-16 (×3): qty 1

## 2023-01-16 MED ORDER — PANTOPRAZOLE SODIUM 40 MG PO TBEC
40.0000 mg | DELAYED_RELEASE_TABLET | Freq: Every day | ORAL | Status: DC
  Administered 2023-01-16 – 2023-01-19 (×4): 40 mg via ORAL
  Filled 2023-01-16 (×4): qty 1

## 2023-01-16 MED ORDER — LOSARTAN POTASSIUM 50 MG PO TABS
100.0000 mg | ORAL_TABLET | Freq: Every morning | ORAL | Status: DC
  Administered 2023-01-17 – 2023-01-19 (×3): 100 mg via ORAL
  Filled 2023-01-16 (×3): qty 2

## 2023-01-16 MED ORDER — ENOXAPARIN SODIUM 40 MG/0.4ML IJ SOSY
40.0000 mg | PREFILLED_SYRINGE | INTRAMUSCULAR | Status: DC
  Administered 2023-01-17 – 2023-01-19 (×3): 40 mg via SUBCUTANEOUS
  Filled 2023-01-16 (×3): qty 0.4

## 2023-01-16 MED ORDER — TRAMADOL HCL 50 MG PO TABS
50.0000 mg | ORAL_TABLET | Freq: Three times a day (TID) | ORAL | Status: DC | PRN
  Administered 2023-01-17: 50 mg via ORAL
  Filled 2023-01-16: qty 1

## 2023-01-16 MED ORDER — HYDROCHLOROTHIAZIDE 25 MG PO TABS
25.0000 mg | ORAL_TABLET | Freq: Every day | ORAL | Status: DC
  Administered 2023-01-17 – 2023-01-19 (×3): 25 mg via ORAL
  Filled 2023-01-16 (×3): qty 1

## 2023-01-16 MED ORDER — LEVOTHYROXINE SODIUM 25 MCG PO TABS
125.0000 ug | ORAL_TABLET | Freq: Every day | ORAL | Status: DC
  Administered 2023-01-17 – 2023-01-19 (×3): 125 ug via ORAL
  Filled 2023-01-16 (×3): qty 1

## 2023-01-16 MED ORDER — VENLAFAXINE HCL ER 75 MG PO CP24
75.0000 mg | ORAL_CAPSULE | Freq: Every day | ORAL | Status: DC
  Administered 2023-01-17 – 2023-01-19 (×3): 75 mg via ORAL
  Filled 2023-01-16 (×3): qty 1

## 2023-01-16 MED ORDER — SODIUM CHLORIDE 0.9 % IV SOLN
2.0000 g | Freq: Once | INTRAVENOUS | Status: AC
  Administered 2023-01-16: 2 g via INTRAVENOUS
  Filled 2023-01-16: qty 20

## 2023-01-16 MED ORDER — ACETAMINOPHEN 325 MG PO TABS
ORAL_TABLET | ORAL | Status: AC
  Administered 2023-01-16: 650 mg via ORAL
  Filled 2023-01-16: qty 2

## 2023-01-16 MED ORDER — INSULIN GLARGINE-YFGN 100 UNIT/ML ~~LOC~~ SOLN
28.0000 [IU] | Freq: Every day | SUBCUTANEOUS | Status: DC
  Administered 2023-01-16: 28 [IU] via SUBCUTANEOUS
  Filled 2023-01-16: qty 0.28

## 2023-01-16 MED ORDER — FESOTERODINE FUMARATE ER 4 MG PO TB24
4.0000 mg | ORAL_TABLET | Freq: Every day | ORAL | Status: DC
  Administered 2023-01-17 – 2023-01-19 (×3): 4 mg via ORAL
  Filled 2023-01-16 (×4): qty 1

## 2023-01-16 MED ORDER — GABAPENTIN 100 MG PO CAPS
100.0000 mg | ORAL_CAPSULE | Freq: Three times a day (TID) | ORAL | Status: DC
  Administered 2023-01-16 – 2023-01-19 (×8): 100 mg via ORAL
  Filled 2023-01-16 (×8): qty 1

## 2023-01-16 MED ORDER — INSULIN GLARGINE-YFGN 100 UNIT/ML ~~LOC~~ SOLN
15.0000 [IU] | Freq: Every day | SUBCUTANEOUS | Status: DC
  Administered 2023-01-17 – 2023-01-18 (×2): 15 [IU] via SUBCUTANEOUS
  Filled 2023-01-16 (×3): qty 0.15

## 2023-01-16 NOTE — Subjective & Objective (Addendum)
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 Her evaluation, including CT abdomen/pelvis - negative for any acute process; CT c-spine negative for fracture but with multi-level spndylosis, U/A with moderated bacteria, 21-50 WBC/hpf, nl CBC, Bmet with Cr 1.0 in a setting of CKD 3. She was diagnosed with UTI with possible pyelonephritis. She received Rocephin in the ED and was sent home on oral abx.   Today, 01/16/23, she felt worse than yesterday, was noted to be confused, very different from her baseline. For these symptoms she returns to [facility] for re-evaluation.

## 2023-01-16 NOTE — Assessment & Plan Note (Signed)
Last A1C 6.9% 12/25/22.   Plan Hold metformin in setting of mild AKI  Continue basal insulin  Sliding scale coverage

## 2023-01-16 NOTE — Assessment & Plan Note (Signed)
BP mildly elevated.  Plan  Continue home regimen 

## 2023-01-16 NOTE — ED Triage Notes (Signed)
Pt brought in with daughter for increasing confusion today.  Seen yesterday and treated for possible pyelo.  Pt has been complaining of headache. Febrile in triage. Daughter states her balance is off and she is not herself.

## 2023-01-16 NOTE — ED Provider Notes (Signed)
[place] EMERGENCY DEPARTMENT AT [facility] Provider Note  CSN: [id number] Arrival date & time: 01/15/23 1903  Chief Complaint(s) Near Syncope and Fall  HPI Kimberly [name] is a 72 y.o. female with history of diabetes, hypertension presenting to the emergency department with generalized weakness and near syncope.  The patient reports over the past 2 days has developed generalized weakness and malaise.  Also reports right flank pain.  She reports chronic urinary frequency, not sure if different than normal.  No dysuria.  Has not noticed any fevers or chills.  No chest pain, abdominal pain.  No shortness of breath or cough.  No headaches.  Was sitting on the couch today lying down and then stood up, felt lightheaded and fell and hit her head.  No wounds.   Past Medical History Past Medical History:  Diagnosis Date   Anxiety    Arthritis    Depression    Diabetes mellitus    Family history of breast cancer    Family history of colon cancer    Family history of prostate cancer    Family history of skin cancer    GERD (gastroesophageal reflux disease)    High blood pressure    Hypothyroid    Obesity    PONV (postoperative nausea and vomiting)    Patient Active Problem List   Diagnosis Date Noted   Mixed hyperlipidemia 08/24/2022   Genetic testing 10/25/2021   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) 09/05/2021   Vitamin D deficiency 09/05/2021   Breast cancer of upper-outer quadrant of right female breast (HCC) 08/31/2021   S/P mastectomy, right 07/29/2021   Nonalcoholic steatohepatitis (NASH) 11/29/2020   Urinary incontinence 05/07/2020   Depression 12/29/2015   S/P knee replacement 09/02/2013   Essential hypertension, benign 07/06/2013   Hypothyroidism 07/06/2013   Home Medication(s) Prior to Admission medications   Medication Sig Start Date End Date Taking? Authorizing Provider  cefdinir (OMNICEF) 300 MG capsule Take 1 capsule (300 mg total) by mouth  2 (two) times daily for 10 days. 01/16/23 01/26/23 Yes Lonell Grandchild, MD  anastrozole (ARIMIDEX) 1 MG tablet TAKE 1 TABLET DAILY 07/25/22   Doreatha Massed, MD  Calcium-Vitamin D 500-3.125 MG-MCG TABS Take by mouth. Takes 2 daily    [provider]  Continuous Blood Gluc Sensor (DEXCOM G7 SENSOR) MISC by Does not apply route.    [provider]  diclofenac Sodium (VOLTAREN) 1 % GEL Apply 2 g topically daily as needed (knee pain).    [provider]  famotidine (PEPCID) 40 MG tablet TAKE 1 TABLET DAILY 07/19/22   Everlene Other G, DO  gabapentin (NEURONTIN) 100 MG capsule Take 1 capsule (100 mg total) by mouth 3 (three) times daily. 11/25/22   Tommie Sams, DO  glipiZIDE (GLUCOTROL XL) 5 MG 24 hr tablet Take 1 tablet (5 mg total) by mouth daily with breakfast. 12/25/22   Dani Gobble, NP  hydrochlorothiazide (HYDRODIURIL) 25 MG tablet TAKE 1 TABLET DAILY 07/19/22   Cook, [name], DO  insulin degludec (TRESIBA FLEXTOUCH) 100 UNIT/ML FlexTouch Pen Inject 28 Units into the skin at bedtime. 12/25/22   Dani Gobble, NP  Insulin Pen Needle (B-D ULTRAFINE III SHORT PEN) 31G X 8 MM MISC 1 each by Does not apply route as directed. Use to inject insulin once daily 12/25/22   Dani Gobble, NP  levothyroxine (SYNTHROID) 125 MCG tablet Take 1 tablet (125 mcg total) by mouth daily before breakfast. 12/25/22  Dani Gobble, NP  losartan (COZAAR) 100 MG tablet TAKE 1 TABLET EVERY MORNING 07/14/22   Tommie Sams, DO  Magnesium 250 MG TABS Take 250 mg by mouth daily.    [provider]  metFORMIN (GLUCOPHAGE-XR) 500 MG 24 hr tablet Take 1 tablet (500 mg total) by mouth 2 (two) times daily with a meal. 12/25/22   Dani Gobble, NP  metoprolol succinate (TOPROL-XL) 100 MG 24 hr tablet TAKE 1 TABLET AT BEDTIME   WITH OR IMMEDIATELY        FOLLOWING A MEAL 07/14/22   Cook, [name], DO  naproxen (NAPROSYN) 500 MG tablet Take 1 tablet (500 mg total) by mouth  2 (two) times daily with a meal. 02/22/22   Cook, Verdis Frederickson, DO  omeprazole (PRILOSEC) 20 MG capsule TAKE 1 CAPSULE AT BEDTIME 07/14/22   Cook, Dorie Rank G, DO  tolterodine (DETROL LA) 4 MG 24 hr capsule Take 1 capsule (4 mg total) by mouth daily. 02/13/22   McKenzie, Mardene Celeste, MD  ULTICARE MINI PEN NEEDLES 31G X 6 MM MISC USE ONCE DAILY AS DIRECTED. 10/25/22   Roma Kayser, MD  venlafaxine XR (EFFEXOR-XR) 75 MG 24 hr capsule TAKE 1 CAPSULE BY MOUTH ONCE DAILY WITH BREAKFAST 10/17/22   Tommie Sams, DO                                                                                                                                    Past Surgical History Past Surgical History:  Procedure Laterality Date   ABDOMINAL HYSTERECTOMY     Bunions Right    CARPAL TUNNEL RELEASE Right    KNEE ARTHROSCOPY Right    MASTECTOMY MODIFIED RADICAL Right 07/29/2021   invasive ductal ca   THYROID SURGERY     removal   TOTAL KNEE ARTHROPLASTY Right 08/25/2013   Procedure: TOTAL KNEE ARTHROPLASTY;  Surgeon: Vickki Hearing, MD;  Location: [facility];  Service: Orthopedics;  Laterality: Right;   TOTAL KNEE ARTHROPLASTY Left 09/09/2014   Procedure: LEFT TOTAL KNEE ARTHROPLASTY;  Surgeon: Vickki Hearing, MD;  Location: [facility];  Service: Orthopedics;  Laterality: Left;   Family History Family History  Problem Relation Age of Onset   Diabetes Mother    Hypertension Mother    Congestive Heart Failure Father    Hypertension Father    Skin cancer Father 55       squamous cell on lip; metastatic   Hypertension Brother    Skin cancer Brother        squamous cell   Breast cancer Paternal Aunt        dx 22s d. 22s   Prostate cancer Paternal Uncle        dx 48s   Colon cancer Maternal Grandmother        dx 35s d. 57s   Diabetes Other    Cancer Cousin  unk type; possibly pancreatic    Social History Social History   Tobacco Use   Smoking status: Never   Smokeless tobacco: Never  Vaping Use    Vaping Use: Never used  Substance Use Topics   Alcohol use: No   Drug use: No   Allergies Ace inhibitors  Review of Systems Review of Systems  All other systems reviewed and are negative.   Physical Exam Vital Signs  I have reviewed the triage vital signs BP (!) 142/85   Pulse 83   Temp (!) 102.2 F (39 C) (Oral)   Resp 20   Ht 5' 3.5" (1.613 m)   Wt 86 kg   SpO2 95%   BMI 33.06 kg/m  Physical Exam Vitals and nursing note reviewed.  Constitutional:      General: She is not in acute distress.    Appearance: She is well-developed.  HENT:     Head: Normocephalic and atraumatic.     Mouth/Throat:     Mouth: Mucous membranes are moist.  Eyes:     Pupils: Pupils are equal, round, and reactive to light.  Cardiovascular:     Rate and Rhythm: Normal rate and regular rhythm.     Heart sounds: No murmur heard. Pulmonary:     Effort: Pulmonary effort is normal. No respiratory distress.     Breath sounds: Normal breath sounds.  Abdominal:     General: Abdomen is flat.     Palpations: Abdomen is soft.     Tenderness: There is no abdominal tenderness. There is right CVA tenderness. There is no left CVA tenderness.  Musculoskeletal:        General: No tenderness.     Right lower leg: No edema.     Left lower leg: No edema.  Skin:    General: Skin is warm and dry.  Neurological:     General: No focal deficit present.     Mental Status: She is alert. Mental status is at baseline.  Psychiatric:        Mood and Affect: Mood normal.        Behavior: Behavior normal.     ED Results and Treatments Labs (all labs ordered are listed, but only abnormal results are displayed) Labs Reviewed  CBC - Abnormal; Notable for the following components:      Result Value   RBC 3.47 (*)    Hemoglobin 11.1 (*)    HCT 32.2 (*)    All other components within normal limits  BASIC METABOLIC PANEL - Abnormal; Notable for the following components:   Sodium 130 (*)    Potassium 3.4 (*)     Chloride 97 (*)    Glucose, Bld 143 (*)    Calcium 8.8 (*)    All other components within normal limits  URINALYSIS, ROUTINE W REFLEX MICROSCOPIC - Abnormal; Notable for the following components:   APPearance HAZY (*)    Protein, ur 30 (*)    Leukocytes,Ua SMALL (*)    Bacteria, UA RARE (*)    All other components within normal limits  URINE CULTURE  Radiology CT ABDOMEN PELVIS W CONTRAST  Result Date: 01/15/2023 CLINICAL DATA:  Syncope abdomen pain EXAM: CT ABDOMEN AND PELVIS WITH CONTRAST TECHNIQUE: Multidetector CT imaging of the abdomen and pelvis was performed using the standard protocol following bolus administration of intravenous contrast. RADIATION DOSE REDUCTION: This exam was performed according to the departmental dose-optimization program which includes automated exposure control, adjustment of the mA and/or kV according to patient size and/or use of iterative reconstruction technique. CONTRAST:  OMNIPAQUE IOHEXOL 300 MG/ML  SOLN COMPARISON:  None Available. FINDINGS: Lower chest: Lung bases demonstrate mild subpleural scarring. No acute airspace disease. Borderline cardiac size. Hepatobiliary: Gallstones. No biliary dilatation. No focal hepatic abnormality. Pancreas: Unremarkable. No pancreatic ductal dilatation or surrounding inflammatory changes. Spleen: Upper normal in size at 13 cm Adrenals/Urinary Tract: Adrenal glands are normal. Kidneys show no hydronephrosis. The bladder is unremarkable. Stomach/Bowel: Stomach nonenlarged. No dilated small bowel. No acute bowel wall thickening. Negative appendix. Vascular/Lymphatic: Mild aortic atherosclerosis. No aortic aneurysm. 11 mm calcified splenic artery aneurysm. No suspicious lymph nodes. Reproductive: Status post hysterectomy. No adnexal masses. Other: Negative for pelvic effusion or free air.  Musculoskeletal: No acute or suspicious osseous abnormality. Multilevel degenerative changes IMPRESSION: 1. No CT evidence for acute intra-abdominal or pelvic abnormality. 2. Gallstones. 3. 11 mm calcified splenic artery aneurysm Electronically Signed   By: Jasmine Pang M.D.   On: 01/15/2023 23:45   CT Head Wo Contrast  Result Date: 01/15/2023 CLINICAL DATA:  Headache, syncope EXAM: CT HEAD WITHOUT CONTRAST TECHNIQUE: Contiguous axial images were obtained from the base of the skull through the vertex without intravenous contrast. RADIATION DOSE REDUCTION: This exam was performed according to the departmental dose-optimization program which includes automated exposure control, adjustment of the mA and/or kV according to patient size and/or use of iterative reconstruction technique. COMPARISON:  None Available. FINDINGS: Brain: Hypodensities within the bilateral parietal periventricular white matter most consistent with chronic small vessel ischemic change. Otherwise no acute infarct or hemorrhage. Lateral ventricles and midline structures are unremarkable. No acute extra-axial fluid collections. No mass effect. Vascular: No hyperdense vessel or unexpected calcification. Skull: Normal. Negative for fracture or focal lesion. Sinuses/Orbits: No acute finding. Other: None. IMPRESSION: 1. No acute intracranial process. Electronically Signed   By: Sharlet Salina M.D.   On: 01/15/2023 23:41   CT Cervical Spine Wo Contrast  Result Date: 01/15/2023 CLINICAL DATA:  Syncope, neck trauma EXAM: CT CERVICAL SPINE WITHOUT CONTRAST TECHNIQUE: Multidetector CT imaging of the cervical spine was performed without intravenous contrast. Multiplanar CT image reconstructions were also generated. RADIATION DOSE REDUCTION: This exam was performed according to the departmental dose-optimization program which includes automated exposure control, adjustment of the mA and/or kV according to patient size and/or use of iterative  reconstruction technique. COMPARISON:  None Available. FINDINGS: Alignment: There is right convex scoliosis centered at the C4-5 level. Reversal cervical lordosis likely due to multilevel spondylosis and facet hypertrophy. Skull base and vertebrae: No acute fracture. No primary bone lesion or focal pathologic process. Soft tissues and spinal canal: No prevertebral fluid or swelling. No visible canal hematoma. Disc levels: There is diffuse multilevel spondylosis and facet hypertrophy. Disc space narrowing is most pronounced at C4-5, C5-6, and C6-7. Severe left predominant facet hypertrophic changes are seen from C2-3 through C5-6. Upper chest: Airway is patent.  Lung apices are clear. Other: Reconstructed images demonstrate no additional findings. IMPRESSION: 1. No acute cervical spine fracture. 2. Extensive multilevel cervical spondylosis and facet hypertrophy as above. Electronically Signed  By: Sharlet Salina M.D.   On: 01/15/2023 23:40    Pertinent labs & imaging results that were available during my care of the patient were reviewed by me and considered in my medical decision making (see MDM for details).  Medications Ordered in ED Medications  acetaminophen (TYLENOL) tablet 650 mg (650 mg Oral Given 01/15/23 1935)  sodium chloride 0.9 % bolus 1,000 mL (0 mLs Intravenous Stopped 01/16/23 0007)  iohexol (OMNIPAQUE) 300 MG/ML solution 100 mL (100 mLs Intravenous Contrast Given 01/15/23 2311)  cefTRIAXone (ROCEPHIN) 1 g in sodium chloride 0.9 % 100 mL IVPB (0 g Intravenous Stopped 01/16/23 0047)                                                                                                                                     Procedures Procedures  (including critical care time)  Medical Decision Making / ED Course   MDM:  71 year old female presenting to the emergency department with weakness and near syncope after standing.  Patient noted to be febrile.  Exam with right flank  tenderness.  Suspect likely pyelonephritis given flank tenderness and fever.  Treated with ceftriaxone.  Obtain CT abdomen pelvis without evidence of other intra-abdominal process or obstructive stone.  Lungs clear and no coughing.  No headaches or meningismus to suggest meningitis.  No fractures to suggest significant tissue infection.  Suspect near syncopal event in the setting of orthostatic syncope from fever infection. Very low concern for cardiac cause.  Obtain CT head and neck given head injury without evidence of acute intracranial process or fracture.  Patient feels much better after fluids. Will discharge patient to home. All questions answered. Patient comfortable with plan of discharge. Return precautions discussed with patient and specified on the after visit summary.       Additional history obtained: -Additional history obtained from family    Lab Tests: -I ordered, reviewed, and interpreted labs.   The pertinent results include:   Labs Reviewed  CBC - Abnormal; Notable for the following components:      Result Value   RBC 3.47 (*)    Hemoglobin 11.1 (*)    HCT 32.2 (*)    All other components within normal limits  BASIC METABOLIC PANEL - Abnormal; Notable for the following components:   Sodium 130 (*)    Potassium 3.4 (*)    Chloride 97 (*)    Glucose, Bld 143 (*)    Calcium 8.8 (*)    All other components within normal limits  URINALYSIS, ROUTINE W REFLEX MICROSCOPIC - Abnormal; Notable for the following components:   APPearance HAZY (*)    Protein, ur 30 (*)    Leukocytes,Ua SMALL (*)    Bacteria, UA RARE (*)    All other components within normal limits  URINE CULTURE    Notable for signs of UTI and mild dehydration  EKG   EKG Interpretation  Date/Time:  Monday Jan 15 2023 19:38:41 EDT Ventricular Rate:  97 PR Interval:  152 QRS Duration: 138 QT Interval:  372 QTC Calculation: 472 R Axis:   -37 Text Interpretation: Normal sinus rhythm Left axis  deviation Right bundle branch block Abnormal ECG When compared with ECG of Premature ventricular complexes are no longer Present Vent. rate has increased BY  34 BPM Right bundle branch block is now Present Confirmed by Alvino Blood ([id number]) on 01/16/2023 12:20:38 AM         Imaging Studies ordered: I ordered imaging studies including CT head and neck, CT A/P On my interpretation imaging demonstrates no acute process I independently visualized and interpreted imaging. I agree with the radiologist interpretation   Medicines ordered and prescription drug management: Meds ordered this encounter  Medications   acetaminophen (TYLENOL) tablet 650 mg   sodium chloride 0.9 % bolus 1,000 mL   iohexol (OMNIPAQUE) 300 MG/ML solution 100 mL   cefTRIAXone (ROCEPHIN) 1 g in sodium chloride 0.9 % 100 mL IVPB    Order Specific Question:   Antibiotic Indication:    Answer:   UTI   cefdinir (OMNICEF) 300 MG capsule    Sig: Take 1 capsule (300 mg total) by mouth 2 (two) times daily for 10 days.    Dispense:  20 capsule    Refill:  0    -I have reviewed the patients home medicines and have made adjustments as needed Cardiac Monitoring: The patient was maintained on a cardiac monitor.  I personally viewed and interpreted the cardiac monitored which showed an underlying rhythm of: NSR  Social Determinants of Health:  Diagnosis or treatment significantly limited by social determinants of health: obesity   Reevaluation: After the interventions noted above, I reevaluated the patient and found that their symptoms have improved  Co morbidities that complicate the patient evaluation  Past Medical History:  Diagnosis Date   Anxiety    Arthritis    Depression    Diabetes mellitus    Family history of breast cancer    Family history of colon cancer    Family history of prostate cancer    Family history of skin cancer    GERD (gastroesophageal reflux disease)    High blood pressure     Hypothyroid    Obesity    PONV (postoperative nausea and vomiting)       Dispostion: Disposition decision including need for hospitalization was considered, and patient discharged from emergency department.    Final Clinical Impression(s) / ED Diagnoses Final diagnoses:  Pyelonephritis  Near syncope     This chart was dictated using voice recognition software.  Despite best efforts to proofread,  errors can occur which can change the documentation meaning.    Lonell Grandchild, MD 01/16/23 1210

## 2023-01-16 NOTE — ED Notes (Signed)
Xray to bring pt to room 17 when done with chest xrays

## 2023-01-16 NOTE — H&P (Signed)
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 Her evaluation, including CT abdomen/pelvis - negative for any acute process; CT c-spine negative for fracture but with multi-level spndylosis, U/A with moderated bacteria, 21-50 WBC/hpf, nl CBC, Bmet with Cr 1.0 in a setting of CKD 3. She was diagnosed with UTI with possible pyelonephritis. She received Rocephin in the ED and was sent home on oral abx.   Today, 01/16/23, she felt worse than yesterday, was noted to be confused, very different from her baseline. For these symptoms she returns to [facility] for re-evaluation.   ED Course: Tmax 102.2 down to 98.7 at exam.  153/66  HR 74  RR 17.  Lab: Na 128  Cr 1.16  153/66  HR 74  RR 17. Lactic acid 1.1. WBC 4.4 w/ 69/15/14. U/A many bacteria (contaminated) 0-5 WBC/hpf. CT head negative for acute findings. EDP performed LP - spinal fluid analysis pending. TRH called to admit for continued evaluation and treatment  Review of Systems:  Review of Systems  Constitutional:  Positive for chills, fever and malaise/fatigue. Negative for weight loss.  HENT: Negative.    Eyes: Negative.   Cardiovascular: Negative.   Gastrointestinal: Negative.   Genitourinary: Negative.   Musculoskeletal:  Positive for neck pain.  Skin: Negative.   Neurological:  Positive for weakness and headaches. Negative for tremors, sensory change and loss of consciousness.  Endo/Heme/Allergies: Negative.   Psychiatric/Behavioral: Negative.      Past Medical History:  Diagnosis Date   Anxiety    Arthritis    Depression     Diabetes mellitus    Family history of breast cancer    Family history of colon cancer    Family history of prostate cancer    Family history of skin cancer    GERD (gastroesophageal reflux disease)    High blood pressure    Hypothyroid    Obesity    PONV (postoperative nausea and vomiting)     Past Surgical History:  Procedure Laterality Date   ABDOMINAL HYSTERECTOMY     Bunions Right    CARPAL TUNNEL RELEASE Right    KNEE ARTHROSCOPY Right    MASTECTOMY MODIFIED RADICAL Right 07/29/2021   invasive ductal ca   THYROID SURGERY     removal   TOTAL KNEE ARTHROPLASTY Right 08/25/2013   Procedure: TOTAL KNEE ARTHROPLASTY;  Surgeon: Vickki Hearing, MD;  Location: [facility];  Service: Orthopedics;  Laterality: Right;   TOTAL KNEE ARTHROPLASTY Left 09/09/2014   Procedure: LEFT TOTAL KNEE ARTHROPLASTY;  Surgeon: Vickki Hearing, MD;  Location: [facility];  Service: Orthopedics;  Laterality: Left;    Soc Hx - married 54 years, lives with husband. Has 3 sons, 1 daughter, 4 grandchildren. She is retired from working as a Lawyer.    reports that she has never smoked. She has never used smokeless tobacco. She reports that she does not drink alcohol and does not use drugs.  Allergies  Allergen Reactions   Ace Inhibitors Cough    Family History  Problem Relation Age of Onset  Diabetes Mother    Hypertension Mother    Congestive Heart Failure Father    Hypertension Father    Skin cancer Father 13       squamous cell on lip; metastatic   Hypertension Brother    Skin cancer Brother        squamous cell   Breast cancer Paternal Aunt        dx 87s d. 67s   Prostate cancer Paternal Uncle        dx 53s   Colon cancer Maternal Grandmother        dx 60s d. 84s   Diabetes Other    Cancer Cousin        unk type; possibly pancreatic    Prior to Admission medications   Medication Sig Start Date End Date Taking? Authorizing Provider  anastrozole (ARIMIDEX) 1 MG tablet TAKE 1 TABLET  DAILY 07/25/22  Yes Doreatha Massed, MD  cefdinir (OMNICEF) 300 MG capsule Take 1 capsule (300 mg total) by mouth 2 (two) times daily for 10 days. 01/16/23 01/26/23 Yes Lonell Grandchild, MD  Continuous Blood Gluc Sensor (DEXCOM G7 SENSOR) MISC by Does not apply route.   Yes [provider]  diclofenac Sodium (VOLTAREN) 1 % GEL Apply 2 g topically daily as needed (knee pain).   Yes [provider]  famotidine (PEPCID) 40 MG tablet TAKE 1 TABLET DAILY 07/19/22  Yes Cook, [name], DO  gabapentin (NEURONTIN) 100 MG capsule Take 1 capsule (100 mg total) by mouth 3 (three) times daily. 11/25/22  Yes Cook, [name], DO  glipiZIDE (GLUCOTROL XL) 5 MG 24 hr tablet Take 1 tablet (5 mg total) by mouth daily with breakfast. 12/25/22  Yes Dani Gobble, NP  hydrochlorothiazide (HYDRODIURIL) 25 MG tablet TAKE 1 TABLET DAILY 07/19/22  Yes Cook, [name], DO  insulin degludec (TRESIBA FLEXTOUCH) 100 UNIT/ML FlexTouch Pen Inject 28 Units into the skin at bedtime. 12/25/22  Yes Dani Gobble, NP  levothyroxine (SYNTHROID) 125 MCG tablet Take 1 tablet (125 mcg total) by mouth daily before breakfast. 12/25/22  Yes Dani Gobble, NP  losartan (COZAAR) 100 MG tablet TAKE 1 TABLET EVERY MORNING 07/14/22  Yes Cook, [name], DO  metFORMIN (GLUCOPHAGE-XR) 500 MG 24 hr tablet Take 1 tablet (500 mg total) by mouth 2 (two) times daily with a meal. 12/25/22  Yes Reardon, Freddi Starr, NP  metoprolol succinate (TOPROL-XL) 100 MG 24 hr tablet TAKE 1 TABLET AT BEDTIME   WITH OR IMMEDIATELY        FOLLOWING A MEAL Patient taking differently: Take 100 mg by mouth daily. TAKE 1 TABLET AT BEDTIME   WITH OR IMMEDIATELY        FOLLOWING A MEAL 07/14/22  Yes Cook, [name], DO  omeprazole (PRILOSEC) 20 MG capsule TAKE 1 CAPSULE AT BEDTIME 07/14/22  Yes Cook, [name], DO  tolterodine (DETROL LA) 4 MG 24 hr capsule Take 1 capsule (4 mg total) by mouth daily. 02/13/22  Yes McKenzie, Mardene Celeste, MD  venlafaxine XR  (EFFEXOR-XR) 75 MG 24 hr capsule TAKE 1 CAPSULE BY MOUTH ONCE DAILY WITH BREAKFAST 10/17/22  Yes Cook, [name], DO  Insulin Pen Needle (B-D ULTRAFINE III SHORT PEN) 31G X 8 MM MISC 1 each by Does not apply route as directed. Use to inject insulin once daily 12/25/22   Dani Gobble, NP  Magnesium 250 MG TABS Take 250 mg by mouth daily.    [provider]  ULTICARE MINI PEN NEEDLES 31G X 6 MM MISC USE ONCE DAILY AS DIRECTED. 10/25/22   Roma Kayser, MD    Physical Exam: Vitals:   01/16/23 1720 01/16/23 1721 01/16/23 2000 01/16/23 2103  BP: 135/68  (!) 153/66   Pulse: 75  74   Resp: 19  17   Temp:  98.5 F (36.9 C)  98.7 F (37.1 C)  TempSrc:  Oral  Oral  SpO2: 96%  96%     Physical Exam Vitals and nursing note reviewed.  Constitutional:      General: She is not in acute distress.    Appearance: Normal appearance. She is ill-appearing. She is not toxic-appearing or diaphoretic.     Comments: Somnolent during exam. Confused about events. Poor concentration.  Overweight  HENT:     Head: Normocephalic and atraumatic.     Mouth/Throat:     Mouth: Mucous membranes are moist.     Pharynx: Oropharynx is clear. No oropharyngeal exudate.  Eyes:     Extraocular Movements: Extraocular movements intact.     Conjunctiva/sclera: Conjunctivae normal.     Pupils: Pupils are equal, round, and reactive to light.  Cardiovascular:     Rate and Rhythm: Normal rate and regular rhythm.     Pulses: Normal pulses.     Heart sounds: Normal heart sounds.  Pulmonary:     Effort: Pulmonary effort is normal.     Breath sounds: Normal breath sounds.  Abdominal:     General: Bowel sounds are normal. There is no distension.     Palpations: Abdomen is soft. There is no mass.     Tenderness: There is no right CVA tenderness, left CVA tenderness, guarding or rebound.  Musculoskeletal:        General: Normal range of motion.     Cervical back: Normal range of motion. No rigidity or  tenderness.  Skin:    General: Skin is warm and dry.     Coloration: Skin is not jaundiced.     Findings: No bruising or lesion.  Neurological:     Mental Status: She is disoriented.     Cranial Nerves: No cranial nerve deficit.     Comments: Patient awakens to questions but is somnolent, inattentive and drifts off to sleep. She does not give a clear history of the last two days. Daughter is helpful CN- nl facial symmetry and movement. PERRLA, EOMI, no visual deficit, nl shoulder shrug. MS - nl grip, nl proximal and distal strength UE, LE. DTR- 2+ symmetrical Sensation well preserved No cerebellar signs - no tremor, all movement purposefull  Psychiatric:     Comments: Subdued behavior.       Labs on Admission: I have personally reviewed following labs and imaging studies  CBC: Recent Labs  Lab 01/15/23 2025 01/16/23 1459  WBC 4.9 4.4  NEUTROABS  --  3.1  HGB 11.1* 11.4*  HCT 32.2* 33.9*  MCV 92.8 95.0  PLT 233 202   Basic Metabolic Panel: Recent Labs  Lab 01/15/23 2025 01/16/23 1459  NA 130* 128*  K 3.4* 3.6  CL 97* 92*  CO2 23 26  GLUCOSE 143* 106*  BUN 16 15  CREATININE 1.00 1.16*  CALCIUM 8.8* 9.3   GFR: Estimated Creatinine Clearance: 46.8 mL/min (A) (by C-G formula based on SCr of 1.16 mg/dL (H)). Liver Function Tests: Recent Labs  Lab 01/16/23 1459  AST 41  ALT 32  ALKPHOS 68  BILITOT 0.7  PROT 7.6  ALBUMIN 3.9  No results for input(s): "LIPASE", "AMYLASE" in the last 168 hours. No results for input(s): "AMMONIA" in the last 168 hours. Coagulation Profile: Recent Labs  Lab 01/16/23 1654  INR 1.2   Cardiac Enzymes: No results for input(s): "CKTOTAL", "CKMB", "CKMBINDEX", "TROPONINI" in the last 168 hours. BNP (last 3 results) No results for input(s): "PROBNP" in the last 8760 hours. HbA1C: No results for input(s): "HGBA1C" in the last 72 hours. CBG: Recent Labs  Lab 01/16/23 1445  GLUCAP 105*   Lipid Profile: No results for  input(s): "CHOL", "HDL", "LDLCALC", "TRIG", "CHOLHDL", "LDLDIRECT" in the last 72 hours. Thyroid Function Tests: No results for input(s): "TSH", "T4TOTAL", "FREET4", "T3FREE", "THYROIDAB" in the last 72 hours. Anemia Panel: No results for input(s): "VITAMINB12", "FOLATE", "FERRITIN", "TIBC", "IRON", "RETICCTPCT" in the last 72 hours. Urine analysis:    Component Value Date/Time   COLORURINE YELLOW 01/16/2023 1706   APPEARANCEUR CLEAR 01/16/2023 1706   APPEARANCEUR Clear 02/13/2022 1413   LABSPEC 1.015 01/16/2023 1706   PHURINE 6.5 01/16/2023 1706   GLUCOSEU NEGATIVE 01/16/2023 1706   HGBUR NEGATIVE 01/16/2023 1706   BILIRUBINUR NEGATIVE 01/16/2023 1706   BILIRUBINUR Negative 02/13/2022 1413   KETONESUR NEGATIVE 01/16/2023 1706   PROTEINUR TRACE (A) 01/16/2023 1706   UROBILINOGEN 0.2 05/07/2020 1000   NITRITE NEGATIVE 01/16/2023 1706   LEUKOCYTESUR NEGATIVE 01/16/2023 1706    Radiological Exams on Admission: I have personally reviewed images CT Head Wo Contrast  Result Date: 01/16/2023 CLINICAL DATA:  Mental status change of unknown etiology. Progressive confusion. EXAM: CT HEAD WITHOUT CONTRAST TECHNIQUE: Contiguous axial images were obtained from the base of the skull through the vertex without intravenous contrast. RADIATION DOSE REDUCTION: This exam was performed according to the departmental dose-optimization program which includes automated exposure control, adjustment of the mA and/or kV according to patient size and/or use of iterative reconstruction technique. COMPARISON:  01/15/2023 FINDINGS: Brain: No evidence of acute infarction, hemorrhage, hydrocephalus, extra-axial collection or mass lesion/mass effect. There is mild low-attenuation within the subcortical and periventricular white matter compatible with chronic microvascular disease. Vascular: No hyperdense vessel or unexpected calcification. Skull: Normal. Negative for fracture or focal lesion. Sinuses/Orbits: No acute  finding. Other: None. IMPRESSION: 1. No acute intracranial abnormalities. 2. Mild chronic microvascular disease. Electronically Signed   By: Signa Kell M.D.   On: 01/16/2023 16:19   DG Chest 2 View  Result Date: 01/16/2023 CLINICAL DATA:  Cough. EXAM: CHEST - 2 VIEW COMPARISON:  July 27, 2021. FINDINGS: The heart size and mediastinal contours are within normal limits. Both lungs are clear. The visualized skeletal structures are unremarkable. IMPRESSION: No active cardiopulmonary disease. Electronically Signed   By: Lupita Raider M.D.   On: 01/16/2023 16:15   CT ABDOMEN PELVIS W CONTRAST  Result Date: 01/15/2023 CLINICAL DATA:  Syncope abdomen pain EXAM: CT ABDOMEN AND PELVIS WITH CONTRAST TECHNIQUE: Multidetector CT imaging of the abdomen and pelvis was performed using the standard protocol following bolus administration of intravenous contrast. RADIATION DOSE REDUCTION: This exam was performed according to the departmental dose-optimization program which includes automated exposure control, adjustment of the mA and/or kV according to patient size and/or use of iterative reconstruction technique. CONTRAST:  OMNIPAQUE IOHEXOL 300 MG/ML  SOLN COMPARISON:  None Available. FINDINGS: Lower chest: Lung bases demonstrate mild subpleural scarring. No acute airspace disease. Borderline cardiac size. Hepatobiliary: Gallstones. No biliary dilatation. No focal hepatic abnormality. Pancreas: Unremarkable. No pancreatic ductal dilatation or surrounding inflammatory changes. Spleen: Upper normal in size at 13 cm  Adrenals/Urinary Tract: Adrenal glands are normal. Kidneys show no hydronephrosis. The bladder is unremarkable. Stomach/Bowel: Stomach nonenlarged. No dilated small bowel. No acute bowel wall thickening. Negative appendix. Vascular/Lymphatic: Mild aortic atherosclerosis. No aortic aneurysm. 11 mm calcified splenic artery aneurysm. No suspicious lymph nodes. Reproductive: Status post hysterectomy. No  adnexal masses. Other: Negative for pelvic effusion or free air. Musculoskeletal: No acute or suspicious osseous abnormality. Multilevel degenerative changes IMPRESSION: 1. No CT evidence for acute intra-abdominal or pelvic abnormality. 2. Gallstones. 3. 11 mm calcified splenic artery aneurysm Electronically Signed   By: Jasmine Pang M.D.   On: 01/15/2023 23:45   CT Head Wo Contrast  Result Date: 01/15/2023 CLINICAL DATA:  Headache, syncope EXAM: CT HEAD WITHOUT CONTRAST TECHNIQUE: Contiguous axial images were obtained from the base of the skull through the vertex without intravenous contrast. RADIATION DOSE REDUCTION: This exam was performed according to the departmental dose-optimization program which includes automated exposure control, adjustment of the mA and/or kV according to patient size and/or use of iterative reconstruction technique. COMPARISON:  None Available. FINDINGS: Brain: Hypodensities within the bilateral parietal periventricular white matter most consistent with chronic small vessel ischemic change. Otherwise no acute infarct or hemorrhage. Lateral ventricles and midline structures are unremarkable. No acute extra-axial fluid collections. No mass effect. Vascular: No hyperdense vessel or unexpected calcification. Skull: Normal. Negative for fracture or focal lesion. Sinuses/Orbits: No acute finding. Other: None. IMPRESSION: 1. No acute intracranial process. Electronically Signed   By: Sharlet Salina M.D.   On: 01/15/2023 23:41   CT Cervical Spine Wo Contrast  Result Date: 01/15/2023 CLINICAL DATA:  Syncope, neck trauma EXAM: CT CERVICAL SPINE WITHOUT CONTRAST TECHNIQUE: Multidetector CT imaging of the cervical spine was performed without intravenous contrast. Multiplanar CT image reconstructions were also generated. RADIATION DOSE REDUCTION: This exam was performed according to the departmental dose-optimization program which includes automated exposure control, adjustment of the mA  and/or kV according to patient size and/or use of iterative reconstruction technique. COMPARISON:  None Available. FINDINGS: Alignment: There is right convex scoliosis centered at the C4-5 level. Reversal cervical lordosis likely due to multilevel spondylosis and facet hypertrophy. Skull base and vertebrae: No acute fracture. No primary bone lesion or focal pathologic process. Soft tissues and spinal canal: No prevertebral fluid or swelling. No visible canal hematoma. Disc levels: There is diffuse multilevel spondylosis and facet hypertrophy. Disc space narrowing is most pronounced at C4-5, C5-6, and C6-7. Severe left predominant facet hypertrophic changes are seen from C2-3 through C5-6. Upper chest: Airway is patent.  Lung apices are clear. Other: Reconstructed images demonstrate no additional findings. IMPRESSION: 1. No acute cervical spine fracture. 2. Extensive multilevel cervical spondylosis and facet hypertrophy as above. Electronically Signed   By: Sharlet Salina M.D.   On: 01/15/2023 23:40    EKG: I have personally reviewed EKG: Sinus rhythm, LAD, RBBB, old anterior injury  Assessment/Plan Active Problems:   UTI (urinary tract infection)   Fever   Essential hypertension, benign   Hypothyroidism   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)    Assessment and Plan: Fever Patient fever starting 01/15/20 for which she was seen in [facility] and diagnosed with UTI, possibly pyelonephritis. She was treated with IV Rocephin and sent home on oral abx. Today she had recurrent fever, confusion and felt worse than previously. Lab revealed nl CBCD, mild hyponatremia at 128, mild elevation of Cr 10 1.16 (1.0 01/15/23), U/A with 0-5 WBC/hpf. CT head, done due to encephalopathy and headache and fever was  negative for any acute findings. The EDP performed an LP with appropriate fluid analysis pending. With concern for FUO, possibly meningitis or encephalitis patient started on high dose Ceftriaxone and  Vancomycin. Viral etiology possible given nl CBCD  Plan Await spinal fluid results  Continue Vanc and Rocephin pending lab results  Track temperature curve, giving tramadol for pain and mechanical cooling if uncomfortable due to fever  Observation admission to med-surg.   UTI (urinary tract infection) Repeat U/A today with 0-5 WBC, many bacteria.  Plan Rocephin offers broad coverage for any potential persistent UTI.   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) Last A1C 6.9% 12/25/22.   Plan Hold metformin in setting of mild AKI  Continue basal insulin  Sliding scale coverage    Hypothyroidism Last TSH 0.414 07/31/22.  Plan TSH  Continue home dose of synthroid.  Essential hypertension, benign BP mildly elevated.  Plan Continue home regimen       DVT prophylaxis: Lovenox Code Status: Full Code discussed with patient and daughter Family Communication: daughter present during interview and exam. Understands Dx and Tx plan.   Disposition Plan: home when medically stable  Consults called: none  Admission status: Inpatient, Med-Surg   Illene Regulus, MD [facility] 01/16/2023, 10:29 PM

## 2023-01-16 NOTE — Progress Notes (Signed)
Pharmacy Antibiotic Note  FARM GREGORY is a 72 y.o. female admitted on 01/16/2023 with AMS/fever (rule out meningitis.  Pharmacy has been consulted for Vancomycin dosing.  Plan: Vancomycin 1500 mg iv x 1 dose now then 750 mg iv Q 12 hours Ceftriaxone 2 grams iv Q 12 hours Follow up progress, Scr, CSF studies     Temp (24hrs), Avg:100.5 F (38.1 C), Min:98.5 F (36.9 C), Max:102.5 F (39.2 C)  Recent Labs  Lab 01/15/23 2025 01/16/23 1459 01/16/23 1654  WBC 4.9 4.4  --   CREATININE 1.00 1.16*  --   LATICACIDVEN  --  1.1 1.0    Estimated Creatinine Clearance: 46.8 mL/min (A) (by C-G formula based on SCr of 1.16 mg/dL (H)).    Allergies  Allergen Reactions   Ace Inhibitors Cough   Thank you for allowing pharmacy to be a part of this patient's care. Okey Regal, PharmD 01/16/2023 8:46 PM

## 2023-01-16 NOTE — Assessment & Plan Note (Signed)
Last TSH 0.414 07/31/22.  Plan TSH  Continue home dose of synthroid.

## 2023-01-16 NOTE — Assessment & Plan Note (Signed)
Repeat U/A today with 0-5 WBC, many bacteria.  Plan Rocephin offers broad coverage for any potential persistent UTI.

## 2023-01-16 NOTE — ED Provider Notes (Addendum)
[place] EMERGENCY DEPARTMENT AT [facility] Provider Note  CSN: [id number] Arrival date & time: 01/16/23 1427  Chief Complaint(s) Altered Mental Status  HPI Kimberly [name] is a 72 y.o. female with history of hypertension, diabetes presents to the emergency department with confusion.  Patient seen in the emergency department yesterday after having a near syncopal event and falling and hitting her head, also with fever.  Have right flank pain and treated with ceftriaxone for suspected pyelonephritis.  Patient returns today after having taken an additional dose of her home antibiotics with some confusion.  Daughter reports that this morning was more herself then seemed more confused later.  No new falls.  Patient also reports a headache.  No neck pain or neck stiffness.  Daughter reports that she has had some intermittent cough.  Patient continues to report right flank pain without any other urinary symptoms or other abdominal pain.   Past Medical History Past Medical History:  Diagnosis Date   Anxiety    Arthritis    Depression    Diabetes mellitus    Family history of breast cancer    Family history of colon cancer    Family history of prostate cancer    Family history of skin cancer    GERD (gastroesophageal reflux disease)    High blood pressure    Hypothyroid    Obesity    PONV (postoperative nausea and vomiting)    Patient Active Problem List   Diagnosis Date Noted   UTI (urinary tract infection) 01/16/2023   Fever 01/16/2023   Mixed hyperlipidemia 08/24/2022   Genetic testing 10/25/2021   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) 09/05/2021   Vitamin D deficiency 09/05/2021   Breast cancer of upper-outer quadrant of right female breast (HCC) 08/31/2021   S/P mastectomy, right 07/29/2021   Nonalcoholic steatohepatitis (NASH) 11/29/2020   Urinary incontinence 05/07/2020   Depression 12/29/2015   S/P knee replacement 09/02/2013   Essential  hypertension, benign 07/06/2013   Hypothyroidism 07/06/2013   Home Medication(s) Prior to Admission medications   Medication Sig Start Date End Date Taking? Authorizing Provider  anastrozole (ARIMIDEX) 1 MG tablet TAKE 1 TABLET DAILY 07/25/22  Yes Doreatha Massed, MD  cefdinir (OMNICEF) 300 MG capsule Take 1 capsule (300 mg total) by mouth 2 (two) times daily for 10 days. 01/16/23 01/26/23 Yes Lonell Grandchild, MD  Continuous Blood Gluc Sensor (DEXCOM G7 SENSOR) MISC by Does not apply route.   Yes [provider]  diclofenac Sodium (VOLTAREN) 1 % GEL Apply 2 g topically daily as needed (knee pain).   Yes [provider]  famotidine (PEPCID) 40 MG tablet TAKE 1 TABLET DAILY 07/19/22  Yes Cook, [name], DO  gabapentin (NEURONTIN) 100 MG capsule Take 1 capsule (100 mg total) by mouth 3 (three) times daily. 11/25/22  Yes Cook, [name], DO  glipiZIDE (GLUCOTROL XL) 5 MG 24 hr tablet Take 1 tablet (5 mg total) by mouth daily with breakfast. 12/25/22  Yes Dani Gobble, NP  hydrochlorothiazide (HYDRODIURIL) 25 MG tablet TAKE 1 TABLET DAILY 07/19/22  Yes Cook, [name], DO  insulin degludec (TRESIBA FLEXTOUCH) 100 UNIT/ML FlexTouch Pen Inject 28 Units into the skin at bedtime. 12/25/22  Yes Dani Gobble, NP  levothyroxine (SYNTHROID) 125 MCG tablet Take 1 tablet (125 mcg total) by mouth daily before breakfast. 12/25/22  Yes Dani Gobble, NP  losartan (COZAAR) 100 MG tablet TAKE 1 TABLET EVERY MORNING 07/14/22  Yes Everlene Other  G, DO  metFORMIN (GLUCOPHAGE-XR) 500 MG 24 hr tablet Take 1 tablet (500 mg total) by mouth 2 (two) times daily with a meal. 12/25/22  Yes Reardon, Freddi Starr, NP  metoprolol succinate (TOPROL-XL) 100 MG 24 hr tablet TAKE 1 TABLET AT BEDTIME   WITH OR IMMEDIATELY        FOLLOWING A MEAL Patient taking differently: Take 100 mg by mouth daily. TAKE 1 TABLET AT BEDTIME   WITH OR IMMEDIATELY        FOLLOWING A MEAL 07/14/22  Yes Cook, [name], DO   omeprazole (PRILOSEC) 20 MG capsule TAKE 1 CAPSULE AT BEDTIME 07/14/22  Yes Cook, [name], DO  tolterodine (DETROL LA) 4 MG 24 hr capsule Take 1 capsule (4 mg total) by mouth daily. 02/13/22  Yes McKenzie, Mardene Celeste, MD  venlafaxine XR (EFFEXOR-XR) 75 MG 24 hr capsule TAKE 1 CAPSULE BY MOUTH ONCE DAILY WITH BREAKFAST 10/17/22  Yes Cook, [name], DO  Insulin Pen Needle (B-D ULTRAFINE III SHORT PEN) 31G X 8 MM MISC 1 each by Does not apply route as directed. Use to inject insulin once daily 12/25/22   Dani Gobble, NP  Magnesium 250 MG TABS Take 250 mg by mouth daily.    [provider]  ULTICARE MINI PEN NEEDLES 31G X 6 MM MISC USE ONCE DAILY AS DIRECTED. 10/25/22   Roma Kayser, MD                                                                                                                                    Past Surgical History Past Surgical History:  Procedure Laterality Date   ABDOMINAL HYSTERECTOMY     [place] Right    CARPAL TUNNEL RELEASE Right    KNEE ARTHROSCOPY Right    MASTECTOMY MODIFIED RADICAL Right 07/29/2021   invasive ductal ca   THYROID SURGERY     removal   TOTAL KNEE ARTHROPLASTY Right 08/25/2013   Procedure: TOTAL KNEE ARTHROPLASTY;  Surgeon: Vickki Hearing, MD;  Location: [facility];  Service: Orthopedics;  Laterality: Right;   TOTAL KNEE ARTHROPLASTY Left 09/09/2014   Procedure: LEFT TOTAL KNEE ARTHROPLASTY;  Surgeon: Vickki Hearing, MD;  Location: [facility];  Service: Orthopedics;  Laterality: Left;   Family History Family History  Problem Relation Age of Onset   Diabetes Mother    Hypertension Mother    Congestive Heart Failure Father    Hypertension Father    Skin cancer Father 2       squamous cell on lip; metastatic   Hypertension Brother    Skin cancer Brother        squamous cell   Breast cancer Paternal Aunt        dx 34s d. 55s   Prostate cancer Paternal Uncle        dx 44s   Colon cancer Maternal Grandmother  dx  60s d. 53s   Diabetes Other    Cancer Cousin        unk type; possibly pancreatic    Social History Social History   Tobacco Use   Smoking status: Never   Smokeless tobacco: Never  Vaping Use   Vaping Use: Never used  Substance Use Topics   Alcohol use: No   Drug use: No   Allergies Ace inhibitors  Review of Systems Review of Systems  All other systems reviewed and are negative.   Physical Exam Vital Signs  I have reviewed the triage vital signs BP (!) 153/66   Pulse 74   Temp 98.7 F (37.1 C) (Oral)   Resp 17   SpO2 96%  Physical Exam Vitals and nursing note reviewed.  Constitutional:      General: She is not in acute distress.    Appearance: She is well-developed.  HENT:     Head: Normocephalic and atraumatic.     Mouth/Throat:     Mouth: Mucous membranes are moist.  Eyes:     Pupils: Pupils are equal, round, and reactive to light.  Cardiovascular:     Rate and Rhythm: Normal rate and regular rhythm.     Heart sounds: No murmur heard. Pulmonary:     Effort: Pulmonary effort is normal. No respiratory distress.     Breath sounds: Normal breath sounds.  Abdominal:     General: Abdomen is flat.     Palpations: Abdomen is soft.     Tenderness: There is no abdominal tenderness. There is right CVA tenderness.  Musculoskeletal:        General: No tenderness.     Cervical back: Neck supple. No rigidity.     Right lower leg: No edema.     Left lower leg: No edema.  Skin:    General: Skin is warm and dry.  Neurological:     Mental Status: She is alert.     Comments: No focal neurologic deficit, no cranial nerve deficit, moving all 4 extremities equally, no sensory deficit to light touch throughout.  Oriented x 3 but does seem slightly confused compared to yesterday  Psychiatric:        Mood and Affect: Mood normal.        Behavior: Behavior normal.     ED Results and Treatments Labs (all labs ordered are listed, but only abnormal results are  displayed) Labs Reviewed  COMPREHENSIVE METABOLIC PANEL - Abnormal; Notable for the following components:      Result Value   Sodium 128 (*)    Chloride 92 (*)    Glucose, Bld 106 (*)    Creatinine, Ser 1.16 (*)    GFR, Estimated 50 (*)    All other components within normal limits  CBC WITH DIFFERENTIAL/PLATELET - Abnormal; Notable for the following components:   RBC 3.57 (*)    Hemoglobin 11.4 (*)    HCT 33.9 (*)    Lymphs Abs 0.6 (*)    All other components within normal limits  URINALYSIS, ROUTINE W REFLEX MICROSCOPIC - Abnormal; Notable for the following components:   Protein, ur TRACE (*)    All other components within normal limits  PROTIME-INR - Abnormal; Notable for the following components:   Prothrombin Time 15.8 (*)    All other components within normal limits  URINALYSIS, MICROSCOPIC (REFLEX) - Abnormal; Notable for the following components:   Bacteria, UA MANY (*)    All other components within normal limits  CSF CELL COUNT WITH DIFFERENTIAL - Abnormal; Notable for the following components:   RBC Count, CSF 23 (*)    All other components within normal limits  CBG MONITORING, ED - Abnormal; Notable for the following components:   Glucose-Capillary 105 (*)    All other components within normal limits  CULTURE, BLOOD (ROUTINE X 2)  CULTURE, BLOOD (ROUTINE X 2)  RESP PANEL BY RT-PCR (RSV, FLU A&B, COVID)  RVPGX2  BODY FLUID CULTURE W GRAM STAIN  LACTIC ACID, PLASMA  LACTIC ACID, PLASMA  CSF CELL COUNT WITH DIFFERENTIAL  GLUCOSE, CSF  PROTEIN, CSF  HSV 1/2 PCR, CSF  MENINGITIS/ENCEPHALITIS PANEL (CSF)                                                                                                                          Radiology CT Head Wo Contrast  Result Date: 01/16/2023 CLINICAL DATA:  Mental status change of unknown etiology. Progressive confusion. EXAM: CT HEAD WITHOUT CONTRAST TECHNIQUE: Contiguous axial images were obtained from the base of the skull  through the vertex without intravenous contrast. RADIATION DOSE REDUCTION: This exam was performed according to the departmental dose-optimization program which includes automated exposure control, adjustment of the mA and/or kV according to patient size and/or use of iterative reconstruction technique. COMPARISON:  01/15/2023 FINDINGS: Brain: No evidence of acute infarction, hemorrhage, hydrocephalus, extra-axial collection or mass lesion/mass effect. There is mild low-attenuation within the subcortical and periventricular white matter compatible with chronic microvascular disease. Vascular: No hyperdense vessel or unexpected calcification. Skull: Normal. Negative for fracture or focal lesion. Sinuses/Orbits: No acute finding. Other: None. IMPRESSION: 1. No acute intracranial abnormalities. 2. Mild chronic microvascular disease. Electronically Signed   By: Signa Kell M.D.   On: 01/16/2023 16:19   DG Chest 2 View  Result Date: 01/16/2023 CLINICAL DATA:  Cough. EXAM: CHEST - 2 VIEW COMPARISON:  July 27, 2021. FINDINGS: The heart size and mediastinal contours are within normal limits. Both lungs are clear. The visualized skeletal structures are unremarkable. IMPRESSION: No active cardiopulmonary disease. Electronically Signed   By: Lupita Raider M.D.   On: 01/16/2023 16:15   CT ABDOMEN PELVIS W CONTRAST  Result Date: 01/15/2023 CLINICAL DATA:  Syncope abdomen pain EXAM: CT ABDOMEN AND PELVIS WITH CONTRAST TECHNIQUE: Multidetector CT imaging of the abdomen and pelvis was performed using the standard protocol following bolus administration of intravenous contrast. RADIATION DOSE REDUCTION: This exam was performed according to the departmental dose-optimization program which includes automated exposure control, adjustment of the mA and/or kV according to patient size and/or use of iterative reconstruction technique. CONTRAST:  OMNIPAQUE IOHEXOL 300 MG/ML  SOLN COMPARISON:  None Available.  FINDINGS: Lower chest: Lung bases demonstrate mild subpleural scarring. No acute airspace disease. Borderline cardiac size. Hepatobiliary: Gallstones. No biliary dilatation. No focal hepatic abnormality. Pancreas: Unremarkable. No pancreatic ductal dilatation or surrounding inflammatory changes. Spleen: Upper normal in size at 13 cm Adrenals/Urinary Tract: Adrenal glands are normal. Kidneys show  no hydronephrosis. The bladder is unremarkable. Stomach/Bowel: Stomach nonenlarged. No dilated small bowel. No acute bowel wall thickening. Negative appendix. Vascular/Lymphatic: Mild aortic atherosclerosis. No aortic aneurysm. 11 mm calcified splenic artery aneurysm. No suspicious lymph nodes. Reproductive: Status post hysterectomy. No adnexal masses. Other: Negative for pelvic effusion or free air. Musculoskeletal: No acute or suspicious osseous abnormality. Multilevel degenerative changes IMPRESSION: 1. No CT evidence for acute intra-abdominal or pelvic abnormality. 2. Gallstones. 3. 11 mm calcified splenic artery aneurysm Electronically Signed   By: Jasmine Pang M.D.   On: 01/15/2023 23:45   CT Head Wo Contrast  Result Date: 01/15/2023 CLINICAL DATA:  Headache, syncope EXAM: CT HEAD WITHOUT CONTRAST TECHNIQUE: Contiguous axial images were obtained from the base of the skull through the vertex without intravenous contrast. RADIATION DOSE REDUCTION: This exam was performed according to the departmental dose-optimization program which includes automated exposure control, adjustment of the mA and/or kV according to patient size and/or use of iterative reconstruction technique. COMPARISON:  None Available. FINDINGS: Brain: Hypodensities within the bilateral parietal periventricular white matter most consistent with chronic small vessel ischemic change. Otherwise no acute infarct or hemorrhage. Lateral ventricles and midline structures are unremarkable. No acute extra-axial fluid collections. No mass effect. Vascular: No  hyperdense vessel or unexpected calcification. Skull: Normal. Negative for fracture or focal lesion. Sinuses/Orbits: No acute finding. Other: None. IMPRESSION: 1. No acute intracranial process. Electronically Signed   By: Sharlet Salina M.D.   On: 01/15/2023 23:41   CT Cervical Spine Wo Contrast  Result Date: 01/15/2023 CLINICAL DATA:  Syncope, neck trauma EXAM: CT CERVICAL SPINE WITHOUT CONTRAST TECHNIQUE: Multidetector CT imaging of the cervical spine was performed without intravenous contrast. Multiplanar CT image reconstructions were also generated. RADIATION DOSE REDUCTION: This exam was performed according to the departmental dose-optimization program which includes automated exposure control, adjustment of the mA and/or kV according to patient size and/or use of iterative reconstruction technique. COMPARISON:  None Available. FINDINGS: Alignment: There is right convex scoliosis centered at the C4-5 level. Reversal cervical lordosis likely due to multilevel spondylosis and facet hypertrophy. Skull base and vertebrae: No acute fracture. No primary bone lesion or focal pathologic process. Soft tissues and spinal canal: No prevertebral fluid or swelling. No visible canal hematoma. Disc levels: There is diffuse multilevel spondylosis and facet hypertrophy. Disc space narrowing is most pronounced at C4-5, C5-6, and C6-7. Severe left predominant facet hypertrophic changes are seen from C2-3 through C5-6. Upper chest: Airway is patent.  Lung apices are clear. Other: Reconstructed images demonstrate no additional findings. IMPRESSION: 1. No acute cervical spine fracture. 2. Extensive multilevel cervical spondylosis and facet hypertrophy as above. Electronically Signed   By: Sharlet Salina M.D.   On: 01/15/2023 23:40    Pertinent labs & imaging results that were available during my care of the patient were reviewed by me and considered in my medical decision making (see MDM for details).  Medications Ordered in  ED Medications  vancomycin (VANCOREADY) IVPB 1500 mg/300 mL (has no administration in time range)  vancomycin (VANCOREADY) IVPB 750 mg/150 mL (has no administration in time range)  cefTRIAXone (ROCEPHIN) 2 g in sodium chloride 0.9 % 100 mL IVPB (has no administration in time range)  anastrozole (ARIMIDEX) tablet 1 mg (has no administration in time range)  hydrochlorothiazide (HYDRODIURIL) tablet 25 mg (has no administration in time range)  losartan (COZAAR) tablet 100 mg (has no administration in time range)  metoprolol succinate (TOPROL-XL) 24 hr tablet 100 mg (has no administration in  time range)  venlafaxine XR (EFFEXOR-XR) 24 hr capsule 75 mg (has no administration in time range)  insulin glargine-yfgn (SEMGLEE) injection 28 Units (has no administration in time range)  levothyroxine (SYNTHROID) tablet 125 mcg (has no administration in time range)  pantoprazole (PROTONIX) EC tablet 40 mg (has no administration in time range)  fesoterodine (TOVIAZ) tablet 4 mg (has no administration in time range)  gabapentin (NEURONTIN) capsule 100 mg (has no administration in time range)  magnesium oxide (MAG-OX) tablet 200 mg (has no administration in time range)  diclofenac Sodium (VOLTAREN) 1 % topical gel 2 g (has no administration in time range)  acetaminophen (TYLENOL) tablet 650 mg (650 mg Oral Given 01/16/23 1451)  sodium chloride 0.9 % bolus 1,000 mL (0 mLs Intravenous Stopped 01/16/23 1729)  fentaNYL (SUBLIMAZE) injection 50 mcg (50 mcg Intravenous Given 01/16/23 2011)  ondansetron (ZOFRAN) injection 4 mg (4 mg Intravenous Given 01/16/23 2011)  cefTRIAXone (ROCEPHIN) 2 g in sodium chloride 0.9 % 100 mL IVPB (2 g Intravenous New Bag/Given 01/16/23 2107)                                                                                                                                     Procedures .Lumbar Puncture  Date/Time: 01/16/2023 10:29 PM  Performed by: Lonell Grandchild, MD Authorized by:  Lonell Grandchild, MD   Consent:    Consent obtained:  Verbal and written   Consent given by:  Patient and healthcare agent   Risks, benefits, and alternatives were discussed: yes     Risks discussed:  Bleeding, headache, nerve damage, infection, pain and repeat procedure   Alternatives discussed:  Observation Universal protocol:    Procedure explained and questions answered to patient or proxy's satisfaction: yes     Patient identity confirmed:  Verbally with patient and arm band Pre-procedure details:    Procedure purpose:  Diagnostic   Preparation: Patient was prepped and draped in usual sterile fashion   Sedation:    Sedation type:  None Anesthesia:    Anesthesia method:  Local infiltration   Local anesthetic:  Lidocaine 1% w/o epi Procedure details:    Lumbar space:  L4-L5 interspace   Patient position:  R lateral decubitus   Needle type:  Spinal needle - Quincke tip   Ultrasound guidance: no     Number of attempts:  1   Fluid appearance:  Clear   Tubes of fluid:  4 Post-procedure details:    Puncture site:  Adhesive bandage applied   Procedure completion:  Tolerated well, no immediate complications .Critical Care  Performed by: Lonell Grandchild, MD Authorized by: Lonell Grandchild, MD   Critical care provider statement:    Critical care time (minutes):  30   Critical care time was exclusive of:  Separately billable procedures and treating other patients   Critical care was necessary to treat or prevent imminent or life-threatening deterioration  of the following conditions:  CNS failure or compromise   Critical care was time spent personally by me on the following activities:  Development of treatment plan with patient or surrogate, discussions with consultants, evaluation of patient's response to treatment, examination of patient, ordering and review of laboratory studies, ordering and review of radiographic studies, ordering and performing treatments and  interventions, pulse oximetry, re-evaluation of patient's condition and review of old charts   Care discussed with: admitting provider     (including critical care time)  Medical Decision Making / ED Course   MDM:  72 year old female presenting to the emergency department with confusion.  Patient is overall well-appearing, continues to have a fever.  No tachycardia.  No meningismus on exam.  Continues to have some right CVA tenderness.  Given new confusion, differential includes metabolic encephalopathy from infection, concussion, delayed head bleed from fall yesterday.  No focal deficit to suggest stroke.  Given flank pain and urinalysis from yesterday, patient was treated empirically for pyelonephritis.  CT abdomen was negative.  CT head and neck were negative for traumatic injury or bleeding.  Urine culture was ordered yesterday as add-on but apparently was not actually performed by laboratory.  Will obtain blood cultures, given daughter reporting she has had some cough will check chest x-ray although lungs clear, will repeat urinalysis today,.  Patient has no meningismus to suggest meningitis but given she is now complaining of some headache and continues to have fevers will consider lumbar puncture if remainder of testing is unrevealing of source of infection.  Anticipate admission given failure of outpatient antibiotics and continued fever and now confusion.  Clinical Course as of 01/16/23 2231  Tue Jan 16, 2023  2118 Urinalysis not very impressive for infection but patient has been on antibiotics.  CT head without interval change.  Chest x-ray with no pneumonia.  COVID/flu negative.  Given unclear infectious source and confusion as well as headaches, discussed lumbar puncture with patient and daughter, they consent to proceed and understand the risks and benefits.  See procedure note for details.  CSF sent to lab.  Covered with 2 g of ceftriaxone and vancomycin. CSF clear, still low concern  for meningitis. Discussed with Dr. Debby Bud of hospitalist service who will admit the patient. [WS]  2228 LP with normal protein and normal glucose. HSV and meningitis PCR panel sent but low concern for meningitis. Admitted. [WS]    Clinical Course User Index [WS] Lonell Grandchild, MD     Additional history obtained: -Additional history obtained from family -External records from outside source obtained and reviewed including: Chart review including previous notes, labs, imaging, consultation notes including ER visit from yesterday   Lab Tests: -I ordered, reviewed, and interpreted labs.   The pertinent results include:   Labs Reviewed  COMPREHENSIVE METABOLIC PANEL - Abnormal; Notable for the following components:      Result Value   Sodium 128 (*)    Chloride 92 (*)    Glucose, Bld 106 (*)    Creatinine, Ser 1.16 (*)    GFR, Estimated 50 (*)    All other components within normal limits  CBC WITH DIFFERENTIAL/PLATELET - Abnormal; Notable for the following components:   RBC 3.57 (*)    Hemoglobin 11.4 (*)    HCT 33.9 (*)    Lymphs Abs 0.6 (*)    All other components within normal limits  URINALYSIS, ROUTINE W REFLEX MICROSCOPIC - Abnormal; Notable for the following components:   Protein, ur TRACE (*)  All other components within normal limits  PROTIME-INR - Abnormal; Notable for the following components:   Prothrombin Time 15.8 (*)    All other components within normal limits  URINALYSIS, MICROSCOPIC (REFLEX) - Abnormal; Notable for the following components:   Bacteria, UA MANY (*)    All other components within normal limits  CSF CELL COUNT WITH DIFFERENTIAL - Abnormal; Notable for the following components:   RBC Count, CSF 23 (*)    All other components within normal limits  CBG MONITORING, ED - Abnormal; Notable for the following components:   Glucose-Capillary 105 (*)    All other components within normal limits  CULTURE, BLOOD (ROUTINE X 2)  CULTURE, BLOOD  (ROUTINE X 2)  RESP PANEL BY RT-PCR (RSV, FLU A&B, COVID)  RVPGX2  BODY FLUID CULTURE W GRAM STAIN  LACTIC ACID, PLASMA  LACTIC ACID, PLASMA  CSF CELL COUNT WITH DIFFERENTIAL  GLUCOSE, CSF  PROTEIN, CSF  HSV 1/2 PCR, CSF  MENINGITIS/ENCEPHALITIS PANEL (CSF)    Notable for normal protein and glucose  EKG   EKG Interpretation  Date/Time:  Tuesday Jan 16 2023 14:49:24 EDT Ventricular Rate:  80 PR Interval:  160 QRS Duration: 140 QT Interval:  364 QTC Calculation: 419 R Axis:   -32 Text Interpretation: Normal sinus rhythm Left axis deviation Right bundle branch block Possible Anterior infarct , age undetermined Abnormal ECG When compared with ECG of 15-Jan-2023 19:38, No significant change was found Confirmed by Alvino Blood ([id number]) on 01/16/2023 3:06:00 PM         Imaging Studies ordered: I ordered imaging studies including CT head, CXR On my interpretation imaging demonstrates no acute process I independently visualized and interpreted imaging. I agree with the radiologist interpretation   Medicines ordered and prescription drug management: Meds ordered this encounter  Medications   acetaminophen (TYLENOL) tablet 650 mg   acetaminophen (TYLENOL) 325 MG tablet    Berrier, Candace B: cabinet override   sodium chloride 0.9 % bolus 1,000 mL   fentaNYL (SUBLIMAZE) injection 50 mcg   ondansetron (ZOFRAN) injection 4 mg   cefTRIAXone (ROCEPHIN) 2 g in sodium chloride 0.9 % 100 mL IVPB    Order Specific Question:   Antibiotic Indication:    Answer:   Bacteremia   vancomycin (VANCOREADY) IVPB 1500 mg/300 mL    Order Specific Question:   Indication:    Answer:   Other Indication (list below)   vancomycin (VANCOREADY) IVPB 750 mg/150 mL    Order Specific Question:   Indication:    Answer:   Other Indication (list below)   cefTRIAXone (ROCEPHIN) 2 g in sodium chloride 0.9 % 100 mL IVPB    Order Specific Question:   Antibiotic Indication:    Answer:   Other Indication  (list below)   anastrozole (ARIMIDEX) tablet 1 mg   hydrochlorothiazide (HYDRODIURIL) tablet 25 mg   losartan (COZAAR) tablet 100 mg   metoprolol succinate (TOPROL-XL) 24 hr tablet 100 mg    TAKE 1 TABLET AT BEDTIME   WITH OR IMMEDIATELY        FOLLOWING A MEAL     venlafaxine XR (EFFEXOR-XR) 24 hr capsule 75 mg    TAKE 1 CAPSULE BY MOUTH ONCE DAILY WITH BREAKFAST     insulin glargine-yfgn (SEMGLEE) injection 28 Units   levothyroxine (SYNTHROID) tablet 125 mcg   pantoprazole (PROTONIX) EC tablet 40 mg   fesoterodine (TOVIAZ) tablet 4 mg   gabapentin (NEURONTIN) capsule 100 mg   magnesium oxide (MAG-OX) tablet 200  mg   diclofenac Sodium (VOLTAREN) 1 % topical gel 2 g    -I have reviewed the patients home medicines and have made adjustments as needed   Consultations Obtained: I requested consultation with the hospitalist,  and discussed lab and imaging findings as well as pertinent plan - they recommend: admission   Cardiac Monitoring: The patient was maintained on a cardiac monitor.  I personally viewed and interpreted the cardiac monitored which showed an underlying rhythm of: NSR  Social Determinants of Health:  Diagnosis or treatment significantly limited by social determinants of health: obesity   Reevaluation: After the interventions noted above, I reevaluated the patient and found that their symptoms have improved  Co morbidities that complicate the patient evaluation  Past Medical History:  Diagnosis Date   Anxiety    Arthritis    Depression    Diabetes mellitus    Family history of breast cancer    Family history of colon cancer    Family history of prostate cancer    Family history of skin cancer    GERD (gastroesophageal reflux disease)    High blood pressure    Hypothyroid    Obesity    PONV (postoperative nausea and vomiting)       Dispostion: Disposition decision including need for hospitalization was considered, and patient admitted to the  hospital.    Final Clinical Impression(s) / ED Diagnoses Final diagnoses:  Fever, unspecified fever cause  Confusion     This chart was dictated using voice recognition software.  Despite best efforts to proofread,  errors can occur which can change the documentation meaning.    Lonell Grandchild, MD 01/16/23 2231    Lonell Grandchild, MD 02/08/23 1106

## 2023-01-16 NOTE — ED Notes (Addendum)
Pt ambulatory to bathroom/ pt refused wheelchair/ daughter went into bathroom with pt/ daughter states she's more alert and oriented than earlier

## 2023-01-16 NOTE — Discharge Instructions (Addendum)
We evaluated you for your weakness and near fainting episode.  We obtained CT scans of your head and your abdomen.  We did not see any signs of a dangerous head injury or neck fracture.  Your CT scan of your abdomen did not show any dangerous process.  Your symptoms are most likely due to a kidney infection.  We have given you a dose of antibiotics in the emergency department and started you on oral antibiotics.  Please take these as prescribed.  Please follow-up closely with your primary doctor for a recheck.  If you have any worsening symptoms such as uncontrolled nausea or vomiting, severe pain, lightheadedness or dizziness, further episodes of fainting, or any other concerning symptoms, please return to the emergency department.

## 2023-01-16 NOTE — Assessment & Plan Note (Addendum)
Patient fever starting 01/15/20 for which she was seen in [facility] and diagnosed with UTI, possibly pyelonephritis. She was treated with IV Rocephin and sent home on oral abx. Today she had recurrent fever, confusion and felt worse than previously. Lab revealed nl CBCD, mild hyponatremia at 128, mild elevation of Cr 10 1.16 (1.0 01/15/23), U/A with 0-5 WBC/hpf. CT head, done due to encephalopathy and headache and fever was negative for any acute findings. The EDP performed an LP with appropriate fluid analysis pending. With concern for FUO, possibly meningitis or encephalitis patient started on high dose Ceftriaxone and Vancomycin. Viral etiology possible given nl CBCD  Plan Await spinal fluid results  Continue Vanc and Rocephin pending lab results  Track temperature curve, giving tramadol for pain and mechanical cooling if uncomfortable due to fever  Observation admission to med-surg.

## 2023-01-16 NOTE — ED Notes (Signed)
Pt just placed in treatment room at this time.

## 2023-01-17 ENCOUNTER — Encounter: Payer: Self-pay | Admitting: Nurse Practitioner

## 2023-01-17 DIAGNOSIS — E1122 Type 2 diabetes mellitus with diabetic chronic kidney disease: Secondary | ICD-10-CM | POA: Diagnosis not present

## 2023-01-17 DIAGNOSIS — E039 Hypothyroidism, unspecified: Secondary | ICD-10-CM | POA: Diagnosis not present

## 2023-01-17 DIAGNOSIS — N1831 Chronic kidney disease, stage 3a: Secondary | ICD-10-CM

## 2023-01-17 DIAGNOSIS — G9340 Encephalopathy, unspecified: Secondary | ICD-10-CM | POA: Diagnosis not present

## 2023-01-17 DIAGNOSIS — I10 Essential (primary) hypertension: Secondary | ICD-10-CM

## 2023-01-17 DIAGNOSIS — R509 Fever, unspecified: Secondary | ICD-10-CM | POA: Diagnosis not present

## 2023-01-17 DIAGNOSIS — N3000 Acute cystitis without hematuria: Secondary | ICD-10-CM

## 2023-01-17 LAB — BASIC METABOLIC PANEL
Anion gap: 10 (ref 5–15)
BUN: 15 mg/dL (ref 8–23)
CO2: 22 mmol/L (ref 22–32)
Calcium: 8.4 mg/dL — ABNORMAL LOW (ref 8.9–10.3)
Chloride: 99 mmol/L (ref 98–111)
Creatinine, Ser: 1.04 mg/dL — ABNORMAL HIGH (ref 0.44–1.00)
GFR, Estimated: 57 mL/min — ABNORMAL LOW (ref >60)
Glucose, Bld: 101 mg/dL — ABNORMAL HIGH (ref 70–99)
Potassium: 3.3 mmol/L — ABNORMAL LOW (ref 3.5–5.1)
Sodium: 131 mmol/L — ABNORMAL LOW (ref 135–145)

## 2023-01-17 LAB — MENINGITIS/ENCEPHALITIS PANEL (CSF)

## 2023-01-17 LAB — CBC
HCT: 32.3 % — ABNORMAL LOW (ref 36.0–46.0)
Hemoglobin: 10.7 g/dL — ABNORMAL LOW (ref 12.0–15.0)
MCH: 31.8 pg (ref 26.0–34.0)
MCHC: 33.1 g/dL (ref 30.0–36.0)
MCV: 95.8 fL (ref 80.0–100.0)
Platelets: 171 10*3/uL (ref 150–400)
RBC: 3.37 MIL/uL — ABNORMAL LOW (ref 3.87–5.11)
RDW: 12.4 % (ref 11.5–15.5)
WBC: 4.2 10*3/uL (ref 4.0–10.5)
nRBC: 0 % (ref 0.0–0.2)

## 2023-01-17 LAB — GLUCOSE, CAPILLARY
Glucose-Capillary: 96 mg/dL (ref 70–99)
Glucose-Capillary: 164 mg/dL — ABNORMAL HIGH (ref 70–99)
Glucose-Capillary: 141 mg/dL — ABNORMAL HIGH (ref 70–99)
Glucose-Capillary: 205 mg/dL — ABNORMAL HIGH (ref 70–99)

## 2023-01-17 LAB — CULTURE, BLOOD (ROUTINE X 2): Special Requests: ADEQUATE

## 2023-01-17 LAB — URINE CULTURE: Culture: NO GROWTH

## 2023-01-17 MED ORDER — ACETAMINOPHEN 325 MG PO TABS
650.0000 mg | ORAL_TABLET | Freq: Four times a day (QID) | ORAL | Status: DC | PRN
  Administered 2023-01-17 – 2023-01-19 (×6): 650 mg via ORAL
  Filled 2023-01-17 (×6): qty 2

## 2023-01-17 MED ORDER — POTASSIUM CHLORIDE CRYS ER 20 MEQ PO TBCR
40.0000 meq | EXTENDED_RELEASE_TABLET | Freq: Once | ORAL | Status: AC
  Administered 2023-01-17: 40 meq via ORAL
  Filled 2023-01-17: qty 2

## 2023-01-17 MED ORDER — ANASTROZOLE 1 MG PO TABS
1.0000 mg | ORAL_TABLET | Freq: Every day | ORAL | Status: DC
  Administered 2023-01-17 – 2023-01-18 (×2): 1 mg via ORAL
  Filled 2023-01-17 (×5): qty 1

## 2023-01-17 NOTE — Progress Notes (Signed)
   01/17/23 1300  Assess: MEWS Score  Temp (!) 102.4 F (39.1 C)  BP (!) 138/54  MAP (mmHg) 77  Pulse Rate 81  Resp 20  Level of Consciousness Alert  SpO2 99 %  Assess: MEWS Score  MEWS Temp 2  MEWS Systolic 0  MEWS Pulse 0  MEWS RR 0  MEWS LOC 0  MEWS Score 2  MEWS Score Color Yellow  Assess: if the MEWS score is Yellow or Red  Were vital signs taken at a resting state? Yes  Focused Assessment No change from prior assessment  Does the patient meet 2 or more of the SIRS criteria? No  MEWS guidelines implemented  No, other (Comment)  Notify: Charge Nurse/RN  Name of Charge Nurse/RN Notified Chales Abrahams, RN  Provider Notification  Provider Name/Title t  Date Provider Notified 01/17/23  Time Provider Notified 1315  Method of Notification Page  Notification Reason Other (Comment) (Temperature made MEWS yellow)  Provider response No new orders  Date of Provider Response 01/17/23  Time of Provider Response 1315  Assess: SIRS CRITERIA  SIRS Temperature  1  SIRS Pulse 0  SIRS Respirations  0  SIRS WBC 0  SIRS Score Sum  1

## 2023-01-17 NOTE — Progress Notes (Signed)
  Transition of Care [facility]) Screening Note   Patient Details  Name: Kimberly [name] Date of Birth: October 31, 1950   Transition of Care [facility]) CM/SW Contact:    Villa Herb, LCSWA Phone Number: 01/17/2023, 10:28 AM    Transition of Care Department [facility]) has reviewed patient and no TOC needs have been identified at this time. We will continue to monitor patient advancement through interdisciplinary progression rounds. If new patient transition needs arise, please place a TOC consult.

## 2023-01-17 NOTE — Progress Notes (Signed)
Pt admitted to unit from ED and unable to answer some admission questions, pt daughter at bedside and provided history.

## 2023-01-17 NOTE — Progress Notes (Signed)
   01/17/23 1300  Vitals  Temp (!) 102.4 F (39.1 C)  BP (!) 138/54  MAP (mmHg) 77  Pulse Rate 81  Resp 20  Level of Consciousness  Level of Consciousness Alert  MEWS COLOR  MEWS Score Color Yellow  Oxygen Therapy  SpO2 99 %  MEWS Score  MEWS Temp 2  MEWS Systolic 0  MEWS Pulse 0  MEWS RR 0  MEWS LOC 0  MEWS Score 2

## 2023-01-17 NOTE — Progress Notes (Signed)
Progress Note   Patient: Kimberly [name]:[id number] DOB: [birth year] DOA: 01/16/2023     1 DOS: the patient was seen and examined on 01/17/2023   Brief hospital admission narrative: As per H&P written by Dr. Debby Bud on 01/16/2023 Ms. 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 Her evaluation, including CT abdomen/pelvis - negative for any acute process; CT c-spine negative for fracture but with multi-level spndylosis, U/A with moderated bacteria, 21-50 WBC/hpf, nl CBC, Bmet with Cr 1.0 in a setting of CKD 3. She was diagnosed with UTI with possible pyelonephritis. She received Rocephin in the ED and was sent home on oral abx.    Today, 01/16/23, she felt worse than yesterday, was noted to be confused, very different from her baseline. For these symptoms she returns to [facility] for re-evaluation.   Assessment and Plan: Fever of unknown origin Patient fever starting 01/15/20 for which she was seen in [facility] and diagnosed with UTI, possibly pyelonephritis. She was treated with IV Rocephin and sent home on oral abx.  -Patient's symptoms continue worsening and patient presented for further evaluation and management -Associated headaches and confusion were also described -LP and fluid analysis requested -Images no suggesting the presence of a structural abnormalities in her brain -Continue treatment with ceftriaxone and vancomycin at the moment -Follow culture results -Continue fluid resuscitation and supportive care.   UTI (urinary tract infection) -Continue current IV antibiotics -Maintain adequate hydration and follow culture results -Patient expressed increased frequency but denies dysuria.  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  -Continue holding oral hypoglycemic agents -Continue sliding scale insulin -Follow CBG  fluctuation.   Hypothyroidism -Last TSH 0.414 07/31/22. -Continue Synthroid  Essential hypertension, benign -Continue current antihypertensive agents and follow vital signs.  Class I obesity -Low-calorie diet, portion control and increase physical activity discussed with patient -Body mass index is 32.32 kg/m.   Hypokalemia: -Continue to replete electrolytes and check magnesium level -Repeat basic metabolic panel in AM.   Subjective:  Still spiking fever and complaining of intermittent headaches.  No chest pain, no nausea, no vomiting.  Reports overall improvement in her presenting symptoms.  Physical Exam: Vitals:   01/17/23 1003 01/17/23 1300 01/17/23 1438 01/17/23 1700  BP: (!) 113/51 (!) 138/54    Pulse: 77 81    Resp: 18 20    Temp: 98.8 F (37.1 C) (!) 102.4 F (39.1 C) (!) 101.5 F (38.6 C) 98.8 F (37.1 C)  TempSrc: Oral Oral  Oral  SpO2: 96% 99%    Weight:      Height:       General exam: Alert, awake, oriented x 3; but feeling is slow to response to questions and at times finding difficulty finding words.  Still spiking fever and expressing intermittent headaches. Respiratory system: Clear to auscultation. Respiratory effort normal.  Good saturation on room air. Cardiovascular system:RRR. No rubs, gallops or JVD. Gastrointestinal system: Abdomen is slightly obese, nondistended, soft and nontender. No organomegaly or masses felt. Normal bowel sounds heard. Central nervous system: Alert and oriented. No focal neurological deficits. Extremities: No cyanosis or clubbing. Skin: No petechiae. Psychiatry: Judgement and insight appear normal. Mood & affect appropriate.   Data Reviewed: Basic metabolic panel: Sodium 131, potassium 3.3, chloride 99, bicarb 22, BUN 15, creatinine 1.04 and GFR >>57 CBC: WBCs 4.2, hemoglobin 10.7 and platelet  count 171 K  Family Communication: Daughter-in-law updated over the phone.  Disposition: Status is: Inpatient Remains  inpatient appropriate because: Continue IV antibiotic therapy.   Planned Discharge Destination: Home  Time spent: 50 minutes  Author: Vassie Loll, MD 01/17/2023 6:00 PM  For on call review [email or web address].

## 2023-01-17 NOTE — Progress Notes (Signed)
Post tylenol patient still has fever of 101.5  Will continue to monitor  01/17/23 1438  Assess: MEWS Score  Temp (!) 101.5 F (38.6 C)  Assess: MEWS Score  MEWS Temp 2  MEWS Systolic 0  MEWS Pulse 0  MEWS RR 0  MEWS LOC 0  MEWS Score 2  MEWS Score Color Yellow  Assess: SIRS CRITERIA  SIRS Temperature  1  SIRS Pulse 0  SIRS Respirations  0  SIRS WBC 0  SIRS Score Sum  1

## 2023-01-17 NOTE — Progress Notes (Signed)
   01/17/23 1700  Assess: MEWS Score  Temp 98.8 F (37.1 C)  Assess: MEWS Score  MEWS Temp 0  MEWS Systolic 0  MEWS Pulse 0  MEWS RR 0  MEWS LOC 0  MEWS Score 0  MEWS Score Color Green  Assess: SIRS CRITERIA  SIRS Temperature  0  SIRS Pulse 0  SIRS Respirations  0  SIRS WBC 0  SIRS Score Sum  0

## 2023-01-18 DIAGNOSIS — E039 Hypothyroidism, unspecified: Secondary | ICD-10-CM | POA: Diagnosis not present

## 2023-01-18 DIAGNOSIS — G9340 Encephalopathy, unspecified: Secondary | ICD-10-CM | POA: Diagnosis not present

## 2023-01-18 DIAGNOSIS — R509 Fever, unspecified: Secondary | ICD-10-CM | POA: Diagnosis not present

## 2023-01-18 DIAGNOSIS — E1122 Type 2 diabetes mellitus with diabetic chronic kidney disease: Secondary | ICD-10-CM | POA: Diagnosis not present

## 2023-01-18 LAB — BASIC METABOLIC PANEL
Anion gap: 9 (ref 5–15)
BUN: 14 mg/dL (ref 8–23)
CO2: 21 mmol/L — ABNORMAL LOW (ref 22–32)
Calcium: 8.2 mg/dL — ABNORMAL LOW (ref 8.9–10.3)
Chloride: 100 mmol/L (ref 98–111)
Creatinine, Ser: 1.03 mg/dL — ABNORMAL HIGH (ref 0.44–1.00)
GFR, Estimated: 58 mL/min — ABNORMAL LOW (ref >60)
Glucose, Bld: 152 mg/dL — ABNORMAL HIGH (ref 70–99)
Potassium: 3.9 mmol/L (ref 3.5–5.1)
Sodium: 130 mmol/L — ABNORMAL LOW (ref 135–145)

## 2023-01-18 LAB — GLUCOSE, CAPILLARY
Glucose-Capillary: 154 mg/dL — ABNORMAL HIGH (ref 70–99)
Glucose-Capillary: 157 mg/dL — ABNORMAL HIGH (ref 70–99)
Glucose-Capillary: 226 mg/dL — ABNORMAL HIGH (ref 70–99)
Glucose-Capillary: 176 mg/dL — ABNORMAL HIGH (ref 70–99)

## 2023-01-18 LAB — MAGNESIUM: Magnesium: 1.4 mg/dL — ABNORMAL LOW (ref 1.7–2.4)

## 2023-01-18 LAB — CULTURE, BLOOD (ROUTINE X 2)

## 2023-01-18 MED ORDER — MAGNESIUM SULFATE 2 GM/50ML IV SOLN
2.0000 g | Freq: Once | INTRAVENOUS | Status: AC
  Administered 2023-01-18: 2 g via INTRAVENOUS
  Filled 2023-01-18: qty 50

## 2023-01-18 MED ORDER — SODIUM CHLORIDE 0.9 % IV SOLN
2.0000 g | INTRAVENOUS | Status: DC
  Administered 2023-01-19: 2 g via INTRAVENOUS
  Filled 2023-01-18: qty 20

## 2023-01-18 MED ORDER — TOPIRAMATE 25 MG PO TABS
25.0000 mg | ORAL_TABLET | Freq: Every day | ORAL | Status: DC
  Administered 2023-01-18 – 2023-01-19 (×2): 25 mg via ORAL
  Filled 2023-01-18 (×2): qty 1

## 2023-01-18 NOTE — Progress Notes (Signed)
Progress Note   Patient: Kimberly [name] WGN:[id number] DOB: December 15, 1950 DOA: 01/16/2023     2 DOS: the patient was seen and examined on 01/18/2023   Brief hospital admission narrative: As per H&P written by Dr. Debby Bud on 01/16/2023 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 Her evaluation, including CT abdomen/pelvis - negative for any acute process; CT c-spine negative for fracture but with multi-level spndylosis, U/A with moderated bacteria, 21-50 WBC/hpf, nl CBC, Bmet with Cr 1.0 in a setting of CKD 3. She was diagnosed with UTI with possible pyelonephritis. She received Rocephin in the ED and was sent home on oral abx.    Today, 01/16/23, she felt worse than yesterday, was noted to be confused, very different from her baseline. For these symptoms she returns to [facility] for re-evaluation.   Assessment and Plan: Fever of unknown origin Patient fever starting 01/15/20 for which she was seen in [facility] and diagnosed with UTI, possibly pyelonephritis. She was treated with IV Rocephin and sent home on oral abx.  -Patient's symptoms continue worsening and patient presented for further evaluation and management -Associated headaches and confusion were also described -LP and fluid analysis requested -Images no suggesting the presence of a structural abnormalities in her brain -Continue treatment with ceftriaxone and vancomycin at the moment -Follow culture results -Continue fluid resuscitation and supportive care.  -Continue as needed antipyretics; currently afebrile.  Toxic encephalopathy -as mentioned above most likely associated with UTI -so far images and CSF fluid not demonstrating meningitis or encephalitis  -continue current antibiotics; but will adjust dose of ceftriaxone. -continue constant reorientation.  UTI (urinary tract infection) -Continue current IV  antibiotics -Maintain adequate hydration and follow culture results -Patient expressed increased frequency but denies dysuria. -Cultures so far not demonstrating any isolated microorganism.  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  -Continue holding oral hypoglycemic agents -Continue sliding scale insulin -Follow CBG fluctuation.   Hypothyroidism -Last TSH 0.414 07/31/22. -Continue Synthroid  Essential hypertension, benign -Continue current antihypertensive agents and follow vital signs.  Class I obesity -Low-calorie diet, portion control and increase physical activity discussed with patient -Body mass index is 32.32 kg/m.   Hypokalemia: -Repleted and 3.9 currently -Continue to follow electrolytes trend.  Hypomagnesemia: -Magnesium 1.4, will replete and follow trend.  Intermittent headaches -Given concern for presence of migraine -Will treat with Topamax and follow response.   Subjective:  Currently afebrile, no chest pain, no nausea, no vomiting, reports feeling better and just expressing intermittent headaches.  Physical Exam: Vitals:   01/17/23 1700 01/17/23 2023 01/18/23 0420 01/18/23 1256  BP:  (!) 134/56 131/64 118/66  Pulse:  78 72 66  Resp:  18 20   Temp: 98.8 F (37.1 C) 98.7 F (37.1 C) 99.6 F (37.6 C) 98.3 F (36.8 C)  TempSrc: Oral  Oral Oral  SpO2:  97% 95% 95%  Weight:      Height:       General exam: Alert, awake, oriented x 3; so far afebrile and reporting chills some intermittent headaches.  No nausea, no vomiting, no chest pain. Respiratory system: Clear to auscultation. Respiratory effort normal.  Good saturation on room air. Cardiovascular system:RRR. No rubs or gallops; no JVD. Gastrointestinal system: Abdomen is slightly obese, nondistended, soft and nontender. No organomegaly or masses felt. Normal bowel sounds heard. Central nervous system:  No focal neurological deficits. Extremities: No cyanosis or  clubbing. Skin: No petechiae. Psychiatry: Judgement and insight appear normal. Mood & affect appropriate.   Data Reviewed: Basic metabolic panel: Sodium 130, potassium 3.9, chloride 100, bicarb 21, BUN 14, creatinine 1.03, GFR 58 -Magnesium: 1.4  Family Communication: Daughter-in-law updated over the phone.  Disposition: Status is: Inpatient Remains inpatient appropriate because: Continue IV antibiotic therapy.   Planned Discharge Destination: Home  Time spent: 50 minutes  Author: Vassie Loll, MD 01/18/2023 2:39 PM  For on call review [email or web address].

## 2023-01-19 DIAGNOSIS — N3000 Acute cystitis without hematuria: Secondary | ICD-10-CM | POA: Diagnosis not present

## 2023-01-19 DIAGNOSIS — G9340 Encephalopathy, unspecified: Secondary | ICD-10-CM | POA: Diagnosis not present

## 2023-01-19 DIAGNOSIS — R5081 Fever presenting with conditions classified elsewhere: Secondary | ICD-10-CM

## 2023-01-19 DIAGNOSIS — I10 Essential (primary) hypertension: Secondary | ICD-10-CM | POA: Diagnosis not present

## 2023-01-19 DIAGNOSIS — E1122 Type 2 diabetes mellitus with diabetic chronic kidney disease: Secondary | ICD-10-CM | POA: Diagnosis not present

## 2023-01-19 LAB — GLUCOSE, CAPILLARY
Glucose-Capillary: 156 mg/dL — ABNORMAL HIGH (ref 70–99)
Glucose-Capillary: 155 mg/dL — ABNORMAL HIGH (ref 70–99)

## 2023-01-19 LAB — BASIC METABOLIC PANEL
Anion gap: 10 (ref 5–15)
BUN: 14 mg/dL (ref 8–23)
CO2: 20 mmol/L — ABNORMAL LOW (ref 22–32)
Calcium: 8.3 mg/dL — ABNORMAL LOW (ref 8.9–10.3)
Chloride: 102 mmol/L (ref 98–111)
Creatinine, Ser: 0.94 mg/dL (ref 0.44–1.00)
GFR, Estimated: >60 mL/min (ref >60)
Glucose, Bld: 143 mg/dL — ABNORMAL HIGH (ref 70–99)
Potassium: 3.7 mmol/L (ref 3.5–5.1)
Sodium: 132 mmol/L — ABNORMAL LOW (ref 135–145)

## 2023-01-19 LAB — MAGNESIUM: Magnesium: 1.7 mg/dL (ref 1.7–2.4)

## 2023-01-19 LAB — HSV 1/2 PCR, CSF
HSV-1 DNA: NEGATIVE
HSV-2 DNA: NEGATIVE

## 2023-01-19 LAB — BODY FLUID CULTURE W GRAM STAIN: Culture: NO GROWTH

## 2023-01-19 MED ORDER — TOPIRAMATE 25 MG PO TABS: ORAL_TABLET | ORAL | 0 refills | Status: DC

## 2023-01-19 NOTE — Discharge Summary (Addendum)
Physician Discharge Summary   Patient: Kimberly [name] MRN: [id number] DOB: Jan 04, 1951  Admit date:     01/16/2023  Discharge date: 01/19/23  Discharge Physician: Vassie Loll   PCP: Tommie Sams, DO   Recommendations at discharge:  Repeat basic metabolic panel to follow ultralights renal function Reassess blood pressure and adjust antihypertensive treatment as needed Continue to closely follow patient's CBGs fluctuation with further adjustment to hypoglycemic regimen as required Patient patient follow-up with urology service as previously instructed. Please continue assisting patient with weight loss management.  Discharge Diagnoses: Principal Problem:   Encephalopathy acute Active Problems:   UTI (urinary tract infection)   Fever   Essential hypertension, benign   Hypothyroidism   Type 2 diabetes with kidney complications [facility])  Brief hospital admission narrative: As per H&P written by Dr. Debby Bud on 01/16/2023 Ms. 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 Her evaluation, including CT abdomen/pelvis - negative for any acute process; CT c-spine negative for fracture but with multi-level spndylosis, U/A with moderated bacteria, 21-50 WBC/hpf, nl CBC, Bmet with Cr 1.0 in a setting of CKD 3. She was diagnosed with UTI with possible pyelonephritis. She received Rocephin in the ED and was sent home on oral abx.    Today, 01/16/23, she felt worse than yesterday, was noted to be confused, very different from her baseline. For these symptoms she returns to [facility] for re-evaluation.   Assessment and Plan: Fever of unknown origin Patient fever starting 01/15/20 for which she was seen in [facility] and diagnosed with UTI, possibly pyelonephritis. She was treated with IV Rocephin and sent home on oral abx.  -Patient's symptoms continue worsening and patient presented for  further evaluation and management -Associated headaches and confusion were also described -LP and fluid analysis requested -Images no suggesting the presence of a structural abnormalities in her brain. -Continue treatment with antibiotics as specified below. -Culture without any growth -Continue fluid resuscitation and supportive care.  -Afebrile for over 36 hours of discharge   Metabolic encephalopathy -as mentioned above most likely associated with UTI -so far images and CSF fluid not demonstrating meningitis or encephalitis.  -Complete oral antibiotic therapy for UTI as mentioned below -continue to provide constant reorientation. -Patient mentation will improve/resolve and back to baseline at discharge.   UTI (urinary tract infection) -Afebrile and without dysuria at time of discharge; patient discharged home with instruction to take another 5-6 days of oral antibiotics using cefdinir. -Maintain adequate hydration and follow culture results -Patient expressed increased frequency but denies dysuria. -Cultures so far not demonstrating any isolated microorganism.   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  -Resume home hypoglycemic regimen -Continue to follow CBGs fluctuation and further adjust therapy as required.        Hypothyroidism -Last TSH 0.414 07/31/22. -Continue Synthroid   Essential hypertension, benign -Resume home antihypertensive agent -Continue heart healthy diet.   Class I obesity -Low-calorie diet, portion control and increase physical activity discussed with patient -Body mass index is 32.32 kg/m.    Hypokalemia: -Repleted and send normal limits at discharge. -Repeat basic metabolic panel at follow-up visit to further follow electrolytes trend/stability.   Hypomagnesemia: -Repleted and within normal limits at discharge -Continue daily oral supplementation for maintenance.   Intermittent headaches -Given concern for  presence of migraine -Will treat with Topamax and follow response. -  Continue to follow patient medical response in need of further management.  Consultants: None Procedures performed: See below for x-ray reports. Disposition: Home Diet recommendation: Heart healthy/modified carbohydrate diet.  DISCHARGE MEDICATION: Allergies as of 01/19/2023       Reactions   Ace Inhibitors Cough        Medication List     TAKE these medications    anastrozole 1 MG tablet Commonly known as: ARIMIDEX TAKE 1 TABLET DAILY   cefdinir 300 MG capsule Commonly known as: OMNICEF Take 1 capsule (300 mg total) by mouth 2 (two) times daily for 10 days.   Dexcom G7 Sensor Misc by Does not apply route.   diclofenac Sodium 1 % Gel Commonly known as: VOLTAREN Apply 2 g topically daily as needed (knee pain).   famotidine 40 MG tablet Commonly known as: PEPCID TAKE 1 TABLET DAILY   gabapentin 100 MG capsule Commonly known as: NEURONTIN Take 1 capsule (100 mg total) by mouth 3 (three) times daily.   glipiZIDE 5 MG 24 hr tablet Commonly known as: GLUCOTROL XL Take 1 tablet (5 mg total) by mouth daily with breakfast.   hydrochlorothiazide 25 MG tablet Commonly known as: HYDRODIURIL TAKE 1 TABLET DAILY   levothyroxine 125 MCG tablet Commonly known as: SYNTHROID Take 1 tablet (125 mcg total) by mouth daily before breakfast.   losartan 100 MG tablet Commonly known as: COZAAR TAKE 1 TABLET EVERY MORNING   Magnesium 250 MG Tabs Take 250 mg by mouth daily.   metFORMIN 500 MG 24 hr tablet Commonly known as: GLUCOPHAGE-XR Take 1 tablet (500 mg total) by mouth 2 (two) times daily with a meal.   metoprolol succinate 100 MG 24 hr tablet Commonly known as: TOPROL-XL TAKE 1 TABLET AT BEDTIME   WITH OR IMMEDIATELY        FOLLOWING A MEAL What changed: See the new instructions.   omeprazole 20 MG capsule Commonly known as: PRILOSEC TAKE 1 CAPSULE AT BEDTIME   tolterodine 4 MG 24 hr  capsule Commonly known as: DETROL LA Take 1 capsule (4 mg total) by mouth daily.   topiramate 25 MG tablet Commonly known as: TOPAMAX Take daily for 5 days; then as needed daily for headaches. Start taking on: Jan 20, 2023   Evaristo Bury FlexTouch 100 UNIT/ML FlexTouch Pen Generic drug: insulin degludec Inject 28 Units into the skin at bedtime.   UltiCare Mini Pen Needles 31G X 6 MM Misc Generic drug: Insulin Pen Needle USE ONCE DAILY AS DIRECTED.   B-D ULTRAFINE III SHORT PEN 31G X 8 MM Misc Generic drug: Insulin Pen Needle 1 each by Does not apply route as directed. Use to inject insulin once daily   venlafaxine XR 75 MG 24 hr capsule Commonly known as: EFFEXOR-XR TAKE 1 CAPSULE BY MOUTH ONCE DAILY WITH BREAKFAST        Follow-up Information     Tommie Sams, DO. Schedule an appointment as soon as possible for a visit in 10 day(s).   Specialty: Family Medicine Contact information: [address] [place] [address] [phone]                Discharge Exam: Ceasar Mons Weights   01/17/23 0606  Weight: 85.4 kg   General exam: Alert, awake, oriented x 3 Respiratory system: Clear to auscultation. Respiratory effort normal. Cardiovascular system:RRR. No murmurs, rubs, gallops. Gastrointestinal system: Abdomen is nondistended, soft and nontender. No organomegaly or masses felt. Normal bowel sounds heard. Central nervous system: Alert  and oriented. No focal neurological deficits. Extremities: No C/C/E, +pedal pulses Skin: No rashes, lesions or ulcers Psychiatry: Judgement and insight appear normal. Mood & affect appropriate.    Condition at discharge: Stable and improved.  The results of significant diagnostics from this hospitalization (including imaging, microbiology, ancillary and laboratory) are listed below for reference.   Imaging Studies: CT Head Wo Contrast  Result Date: 01/16/2023 CLINICAL DATA:  Mental status change of unknown etiology.  Progressive confusion. EXAM: CT HEAD WITHOUT CONTRAST TECHNIQUE: Contiguous axial images were obtained from the base of the skull through the vertex without intravenous contrast. RADIATION DOSE REDUCTION: This exam was performed according to the departmental dose-optimization program which includes automated exposure control, adjustment of the mA and/or kV according to patient size and/or use of iterative reconstruction technique. COMPARISON:  01/15/2023 FINDINGS: Brain: No evidence of acute infarction, hemorrhage, hydrocephalus, extra-axial collection or mass lesion/mass effect. There is mild low-attenuation within the subcortical and periventricular white matter compatible with chronic microvascular disease. Vascular: No hyperdense vessel or unexpected calcification. Skull: Normal. Negative for fracture or focal lesion. Sinuses/Orbits: No acute finding. Other: None. IMPRESSION: 1. No acute intracranial abnormalities. 2. Mild chronic microvascular disease. Electronically Signed   By: Signa Kell M.D.   On: 01/16/2023 16:19   DG Chest 2 View  Result Date: 01/16/2023 CLINICAL DATA:  Cough. EXAM: CHEST - 2 VIEW COMPARISON:  July 27, 2021. FINDINGS: The heart size and mediastinal contours are within normal limits. Both lungs are clear. The visualized skeletal structures are unremarkable. IMPRESSION: No active cardiopulmonary disease. Electronically Signed   By: Lupita Raider M.D.   On: 01/16/2023 16:15   CT ABDOMEN PELVIS W CONTRAST  Result Date: 01/15/2023 CLINICAL DATA:  Syncope abdomen pain EXAM: CT ABDOMEN AND PELVIS WITH CONTRAST TECHNIQUE: Multidetector CT imaging of the abdomen and pelvis was performed using the standard protocol following bolus administration of intravenous contrast. RADIATION DOSE REDUCTION: This exam was performed according to the departmental dose-optimization program which includes automated exposure control, adjustment of the mA and/or kV according to patient size and/or  use of iterative reconstruction technique. CONTRAST:  OMNIPAQUE IOHEXOL 300 MG/ML  SOLN COMPARISON:  None Available. FINDINGS: Lower chest: Lung bases demonstrate mild subpleural scarring. No acute airspace disease. Borderline cardiac size. Hepatobiliary: Gallstones. No biliary dilatation. No focal hepatic abnormality. Pancreas: Unremarkable. No pancreatic ductal dilatation or surrounding inflammatory changes. Spleen: Upper normal in size at 13 cm Adrenals/Urinary Tract: Adrenal glands are normal. Kidneys show no hydronephrosis. The bladder is unremarkable. Stomach/Bowel: Stomach nonenlarged. No dilated small bowel. No acute bowel wall thickening. Negative appendix. Vascular/Lymphatic: Mild aortic atherosclerosis. No aortic aneurysm. 11 mm calcified splenic artery aneurysm. No suspicious lymph nodes. Reproductive: Status post hysterectomy. No adnexal masses. Other: Negative for pelvic effusion or free air. Musculoskeletal: No acute or suspicious osseous abnormality. Multilevel degenerative changes IMPRESSION: 1. No CT evidence for acute intra-abdominal or pelvic abnormality. 2. Gallstones. 3. 11 mm calcified splenic artery aneurysm Electronically Signed   By: Jasmine Pang M.D.   On: 01/15/2023 23:45   CT Head Wo Contrast  Result Date: 01/15/2023 CLINICAL DATA:  Headache, syncope EXAM: CT HEAD WITHOUT CONTRAST TECHNIQUE: Contiguous axial images were obtained from the base of the skull through the vertex without intravenous contrast. RADIATION DOSE REDUCTION: This exam was performed according to the departmental dose-optimization program which includes automated exposure control, adjustment of the mA and/or kV according to patient size and/or use of iterative reconstruction technique. COMPARISON:  None Available. FINDINGS: Brain:  Hypodensities within the bilateral parietal periventricular white matter most consistent with chronic small vessel ischemic change. Otherwise no acute infarct or hemorrhage.  Lateral ventricles and midline structures are unremarkable. No acute extra-axial fluid collections. No mass effect. Vascular: No hyperdense vessel or unexpected calcification. Skull: Normal. Negative for fracture or focal lesion. Sinuses/Orbits: No acute finding. Other: None. IMPRESSION: 1. No acute intracranial process. Electronically Signed   By: Sharlet Salina M.D.   On: 01/15/2023 23:41   CT Cervical Spine Wo Contrast  Result Date: 01/15/2023 CLINICAL DATA:  Syncope, neck trauma EXAM: CT CERVICAL SPINE WITHOUT CONTRAST TECHNIQUE: Multidetector CT imaging of the cervical spine was performed without intravenous contrast. Multiplanar CT image reconstructions were also generated. RADIATION DOSE REDUCTION: This exam was performed according to the departmental dose-optimization program which includes automated exposure control, adjustment of the mA and/or kV according to patient size and/or use of iterative reconstruction technique. COMPARISON:  None Available. FINDINGS: Alignment: There is right convex scoliosis centered at the C4-5 level. Reversal cervical lordosis likely due to multilevel spondylosis and facet hypertrophy. Skull base and vertebrae: No acute fracture. No primary bone lesion or focal pathologic process. Soft tissues and spinal canal: No prevertebral fluid or swelling. No visible canal hematoma. Disc levels: There is diffuse multilevel spondylosis and facet hypertrophy. Disc space narrowing is most pronounced at C4-5, C5-6, and C6-7. Severe left predominant facet hypertrophic changes are seen from C2-3 through C5-6. Upper chest: Airway is patent.  Lung apices are clear. Other: Reconstructed images demonstrate no additional findings. IMPRESSION: 1. No acute cervical spine fracture. 2. Extensive multilevel cervical spondylosis and facet hypertrophy as above. Electronically Signed   By: Sharlet Salina M.D.   On: 01/15/2023 23:40   MM DIAG BREAST TOMO UNI LEFT  Result Date: 12/28/2022 CLINICAL  DATA:  72 year old female with history of right breast cancer post mastectomy 2022 presents with nonfocal tenderness involving the left breast. EXAM: DIGITAL DIAGNOSTIC UNILATERAL LEFT MAMMOGRAM WITH TOMOSYNTHESIS TECHNIQUE: Left digital diagnostic mammography and breast tomosynthesis was performed. COMPARISON:  Previous exam(s). ACR Breast Density Category b: There are scattered areas of fibroglandular density. FINDINGS: No suspicious masses or calcifications are seen in the left breast. There is no mammographic evidence of malignancy in the left breast. IMPRESSION: No mammographic evidence of malignancy in the left breast. RECOMMENDATION: 1. Recommend further management of nonfocal left breast pain be based on clinical assessment. 2.  Screening mammogram in one year.(Code:[id number]-B-01Y) I have discussed the findings and recommendations with the patient. If applicable, a reminder letter will be sent to the patient regarding the next appointment. BI-RADS CATEGORY  1: Negative. Electronically Signed   By: Edwin Cap M.D.   On: 12/28/2022 14:49    Microbiology: Results for orders placed or performed during the hospital encounter of 01/16/23  Culture, blood (routine x 2)     Status: None (Preliminary result)   Collection Time: 01/16/23  3:53 PM   Specimen: BLOOD  Result Value Ref Range Status   Specimen Description BLOOD BLOOD RIGHT ARM  Final   Special Requests   Final    BOTTLES DRAWN AEROBIC AND ANAEROBIC Blood Culture adequate volume   Culture   Final    NO GROWTH 3 DAYS Performed at [facility], [address], [place], [place] [address]    Report Status PENDING  Incomplete  Culture, blood (routine x 2)     Status: None (Preliminary result)   Collection Time: 01/16/23  3:54 PM   Specimen: BLOOD  Result Value Ref  Range Status   Specimen Description BLOOD BLOOD LEFT ARM  Final   Special Requests   Final    BOTTLES DRAWN AEROBIC AND ANAEROBIC Blood Culture adequate volume   Culture   Final     NO GROWTH 3 DAYS Performed at [facility], [address] [place], [place] [address]    Report Status PENDING  Incomplete  Resp panel by RT-PCR (RSV, Flu A&B, Covid) Anterior Nasal Swab     Status: None   Collection Time: 01/16/23  5:06 PM   Specimen: Anterior Nasal Swab  Result Value Ref Range Status   SARS Coronavirus 2 by RT PCR NEGATIVE NEGATIVE Final    Comment: (NOTE) SARS-CoV-2 target nucleic acids are NOT DETECTED.  The SARS-CoV-2 RNA is generally detectable in upper respiratory specimens during the acute phase of infection. The lowest concentration of SARS-CoV-2 viral copies this assay can detect is 138 copies/mL. A negative result does not preclude SARS-Cov-2 infection and should not be used as the sole basis for treatment or other patient management decisions. A negative result may occur with  improper specimen collection/handling, submission of specimen other than nasopharyngeal swab, presence of viral mutation(s) within the areas targeted by this assay, and inadequate number of viral copies(<138 copies/mL). A negative result must be combined with clinical observations, patient history, and epidemiological information. The expected result is Negative.  Fact Sheet for Patients:  BloggerCourse.com  Fact Sheet for Healthcare Providers:  SeriousBroker.it  This test is no t yet approved or cleared by the [place] FDA and  has been authorized for detection and/or diagnosis of SARS-CoV-2 by FDA under an Emergency Use Authorization (EUA). This EUA will remain  in effect (meaning this test can be used) for the duration of the COVID-19 declaration under Section 564(b)(1) of the Act, 21 U.S.C.section 360bbb-3(b)(1), unless the authorization is terminated  or revoked sooner.       Influenza A by PCR NEGATIVE NEGATIVE Final   Influenza B by PCR NEGATIVE NEGATIVE Final    Comment: (NOTE) The Xpert Xpress  SARS-CoV-2/FLU/RSV plus assay is intended as an aid in the diagnosis of influenza from Nasopharyngeal swab specimens and should not be used as a sole basis for treatment. Nasal washings and aspirates are unacceptable for Xpert Xpress SARS-CoV-2/FLU/RSV testing.  Fact Sheet for Patients: BloggerCourse.com  Fact Sheet for Healthcare Providers: SeriousBroker.it  This test is not yet approved or cleared by the [place] FDA and has been authorized for detection and/or diagnosis of SARS-CoV-2 by FDA under an Emergency Use Authorization (EUA). This EUA will remain in effect (meaning this test can be used) for the duration of the COVID-19 declaration under Section 564(b)(1) of the Act, 21 U.S.C. section 360bbb-3(b)(1), unless the authorization is terminated or revoked.     Resp Syncytial Virus by PCR NEGATIVE NEGATIVE Final    Comment: (NOTE) Fact Sheet for Patients: BloggerCourse.com  Fact Sheet for Healthcare Providers: SeriousBroker.it  This test is not yet approved or cleared by the [place] FDA and has been authorized for detection and/or diagnosis of SARS-CoV-2 by FDA under an Emergency Use Authorization (EUA). This EUA will remain in effect (meaning this test can be used) for the duration of the COVID-19 declaration under Section 564(b)(1) of the Act, 21 U.S.C. section 360bbb-3(b)(1), unless the authorization is terminated or revoked.  Performed at [facility], [address], [place], [place] [address]   Body fluid culture w Gram Stain     Status: None (Preliminary result)   Collection  Time: 01/16/23  8:37 PM   Specimen: CSF  Result Value Ref Range Status   Specimen Description   Final    CSF Performed at [facility], [address] [place], [place] [address]    Special Requests   Final    NONE Performed at [facility], [address] [place], [place]  [address]    Culture   Final    NO GROWTH 3 DAYS Performed at [facility], [address], [place], [place] [address]    Report Status PENDING  Incomplete    Labs: CBC: Recent Labs  Lab 01/15/23 2025 01/16/23 1459 01/17/23 0422  WBC 4.9 4.4 4.2  NEUTROABS  --  3.1  --   HGB 11.1* 11.4* 10.7*  HCT 32.2* 33.9* 32.3*  MCV 92.8 95.0 95.8  PLT 233 202 171   Basic Metabolic Panel: Recent Labs  Lab 01/15/23 2025 01/16/23 1459 01/17/23 0422 01/18/23 0403 01/19/23 0413  NA 130* 128* 131* 130* 132*  K 3.4* 3.6 3.3* 3.9 3.7  CL 97* 92* 99 100 102  CO2 23 26 22  21* 20*  GLUCOSE 143* 106* 101* 152* 143*  BUN 16 15 15 14 14   CREATININE 1.00 1.16* 1.04* 1.03* 0.94  CALCIUM 8.8* 9.3 8.4* 8.2* 8.3*  MG  --   --   --  1.4* 1.7   Liver Function Tests: Recent Labs  Lab 01/16/23 1459  AST 41  ALT 32  ALKPHOS 68  BILITOT 0.7  PROT 7.6  ALBUMIN 3.9   CBG: Recent Labs  Lab 01/18/23 0734 01/18/23 1118 01/18/23 1604 01/18/23 2129 01/19/23 0732  GLUCAP 154* 157* 226* 176* 156*    Discharge time spent: greater than 30 minutes.  Signed: Vassie Loll, MD [facility] 01/19/2023

## 2023-01-19 NOTE — Care Management Important Message (Signed)
Important Message  Patient Details  Name: Kimberly [name] MRN: [id number] Date of Birth: [birth year]   Medicare Important Message Given:  Yes     Corey Harold 01/19/2023, 9:21 AM

## 2023-01-20 LAB — BODY FLUID CULTURE W GRAM STAIN

## 2023-01-20 LAB — CULTURE, BLOOD (ROUTINE X 2): Culture: NO GROWTH

## 2023-01-21 LAB — CULTURE, BLOOD (ROUTINE X 2)
Culture: NO GROWTH
Special Requests: ADEQUATE

## 2023-01-23 ENCOUNTER — Telehealth: Payer: Self-pay

## 2023-01-23 NOTE — Transitions of Care (Post Inpatient/ED Visit) (Signed)
01/23/2023  Name: Kimberly [name] MRN: [id number] DOB: 11/03/1950  Today's TOC FU Call Status: Today's TOC FU Call Status:: Successful TOC FU Call Competed TOC FU Call Complete Date: 01/23/23  Transition Care Management Follow-up Telephone Call Date of Discharge: 01/19/23 [facility] Type of Discharge: Inpatient Admission Primary Inpatient Discharge Diagnosis:: Encephalopathy, UTI How have you been since you were released from the hospital?: Better Any questions or concerns?: No  Items Reviewed: Did you receive and understand the discharge instructions provided?: Yes Medications obtained,verified, and reconciled?: Yes (Medications Reviewed) Any new allergies since your discharge?: No Dietary orders reviewed?: Yes Type of Diet Ordered:: Diabetic Do you have support at home?: Yes People in Home: spouse Name of Support/Comfort Primary Source: Jomarie Longs  Medications Reviewed Today: Medications Reviewed Today     Reviewed by Jodelle Gross, RN (Case Manager) on 01/23/23 at 1505  Med List Status: <None>   Medication Order Taking? Sig Documenting Provider Last Dose Status Informant  anastrozole (ARIMIDEX) 1 MG tablet [id number] Yes TAKE 1 TABLET DAILY Doreatha Massed, MD Taking Active Self, Pharmacy Records  bupivacaine liposome (EXPAREL) 1.3 % injection 266 mg [id number]   Vickki Hearing, MD  Active   cefdinir (OMNICEF) 300 MG capsule [id number] Yes Take 1 capsule (300 mg total) by mouth 2 (two) times daily for 10 days. Lonell Grandchild, MD Taking Active Self, Pharmacy Records  Continuous Blood Gluc Sensor [facility] G7 SENSOR) [place] [id number] Yes by Does not apply route. [provider] Taking Active Self, Pharmacy Records  diclofenac Sodium (VOLTAREN) 1 % GEL [id number] No Apply 2 g topically daily as needed (knee pain). [provider] Unknown Active Self, Pharmacy Records  famotidine (PEPCID) 40 MG tablet [id number] No TAKE 1 TABLET  DAILY Tommie Sams, DO Unknown Active Self, Pharmacy Records  gabapentin (NEURONTIN) 100 MG capsule [id number] Yes Take 1 capsule (100 mg total) by mouth 3 (three) times daily. Tommie Sams, DO Taking Active Self, Pharmacy Records  glipiZIDE (GLUCOTROL XL) 5 MG 24 hr tablet [id number] Yes Take 1 tablet (5 mg total) by mouth daily with breakfast. Dani Gobble, NP Taking Active Self, Pharmacy Records  hydrochlorothiazide (HYDRODIURIL) 25 MG tablet [id number] Yes TAKE 1 TABLET DAILY Tommie Sams, DO Taking Active Self, Pharmacy Records  insulin degludec (TRESIBA FLEXTOUCH) 100 UNIT/ML FlexTouch Pen [id number] Yes Inject 28 Units into the skin at bedtime. Dani Gobble, NP Taking Active Self, Pharmacy Records  Insulin Pen Needle (B-D ULTRAFINE III SHORT PEN) 31G X 8 MM MISC [id number] Yes 1 each by Does not apply route as directed. Use to inject insulin once daily Dani Gobble, NP Taking Active Self, Pharmacy Records  levothyroxine (SYNTHROID) 125 MCG tablet [id number] Yes Take 1 tablet (125 mcg total) by mouth daily before breakfast. Dani Gobble, NP Taking Active Self, Pharmacy Records  losartan (COZAAR) 100 MG tablet [id number] Yes TAKE 1 TABLET EVERY MORNING Tommie Sams, DO Taking Active Self, Pharmacy Records  Magnesium 250 MG TABS [id number] No Take 250 mg by mouth daily. [provider] Unknown Active Self, Pharmacy Records           Med Note Mayford Knife, DAWN S   Tue Jan 16, 2023  8:07 PM) Pt has not started yet  metFORMIN (GLUCOPHAGE-XR) 500 MG 24 hr tablet [id number] Yes Take 1 tablet (500 mg total) by mouth 2 (two) times daily with a meal. Dani Gobble, NP Taking Active Self, Pharmacy Records  metoprolol succinate (TOPROL-XL) 100  MG 24 hr tablet [id number] Yes TAKE 1 TABLET AT BEDTIME   WITH OR IMMEDIATELY        FOLLOWING A MEAL  Patient taking differently: Take 100 mg by mouth daily. TAKE 1 TABLET AT BEDTIME   WITH OR IMMEDIATELY        FOLLOWING A MEAL   Tommie Sams, DO Taking Active Self, Pharmacy Records  omeprazole (PRILOSEC) 20 MG capsule [id number] No TAKE 1 CAPSULE AT BEDTIME Tommie Sams, DO Unknown Active Self, Pharmacy Records  tolterodine (DETROL LA) 4 MG 24 hr capsule [id number] Yes Take 1 capsule (4 mg total) by mouth daily. McKenzie, Mardene Celeste, MD Taking Active Self, Pharmacy Records  topiramate (TOPAMAX) 25 MG tablet [id number] Yes Take daily for 5 days; then as needed daily for headaches. Vassie Loll, MD Taking Active   ULTICARE MINI PEN NEEDLES 31G X 6 MM MISC [id number] Yes USE ONCE DAILY AS DIRECTED. Roma Kayser, MD Taking Active Self, Pharmacy Records  venlafaxine XR (EFFEXOR-XR) 75 MG 24 hr capsule [id number] Yes TAKE 1 CAPSULE BY MOUTH ONCE DAILY WITH BREAKFAST Tommie Sams, DO Taking Active Self, Pharmacy Records            Home Care and Equipment/Supplies: Were Home Health Services Ordered?: No Any new equipment or medical supplies ordered?: No  Functional Questionnaire: Do you need assistance with bathing/showering or dressing?: No Do you need assistance with meal preparation?: No Do you need assistance with eating?: No Do you have difficulty maintaining continence: No Do you need assistance with getting out of bed/getting out of a chair/moving?: No Do you have difficulty managing or taking your medications?: No  Follow up appointments reviewed: PCP Follow-up appointment confirmed?: Yes Date of PCP follow-up appointment?: 01/29/23 Follow-up Provider: Dr. Adriana Simas [facility] Follow-up appointment confirmed?: Yes Date of Specialist follow-up appointment?: 02/14/23 Follow-Up Specialty Provider:: Dr. Ronne Binning Do you need transportation to your follow-up appointment?: No Do you understand care options if your condition(s) worsen?: Yes-patient verbalized understanding  SDOH Interventions Today    Flowsheet Row Most Recent Value  SDOH Interventions   Food Insecurity Interventions Intervention Not  Indicated  Housing Interventions Intervention Not Indicated  Transportation Interventions Intervention Not Indicated  Utilities Interventions Intervention Not Indicated  Financial Strain Interventions Intervention Not Indicated      Interventions Today    Flowsheet Row Most Recent Value  Chronic Disease   Chronic disease during today's visit Diabetes  General Interventions   General Interventions Discussed/Reviewed General Interventions Discussed, General Interventions Reviewed, Doctor Visits  Doctor Visits Discussed/Reviewed Doctor Visits Discussed  Pharmacy Interventions   Pharmacy Dicussed/Reviewed Pharmacy Topics Discussed       TOC Interventions Today    Flowsheet Row Most Recent Value  TOC Interventions   TOC Interventions Discussed/Reviewed TOC Interventions Discussed, TOC Interventions Reviewed, Contacted provider for patient needs  [facility] sent to Urologist to let him know patient was admitted for UTI and does not see him until 02/14/23 in case he wants to move up her appt.]       Jodelle Gross, RN, BSN, CCM Care Management Coordinator [facility] Phone: [phone]/Fax: [phone]

## 2023-01-29 ENCOUNTER — Ambulatory Visit (INDEPENDENT_AMBULATORY_CARE_PROVIDER_SITE_OTHER): Payer: Medicare Other | Admitting: Family Medicine

## 2023-01-29 VITALS — BP 130/68 | HR 64 | Temp 97.7°F | Ht 64.0 in | Wt 183.0 lb

## 2023-01-29 DIAGNOSIS — K219 Gastro-esophageal reflux disease without esophagitis: Secondary | ICD-10-CM | POA: Diagnosis not present

## 2023-01-29 DIAGNOSIS — G9340 Encephalopathy, unspecified: Secondary | ICD-10-CM | POA: Diagnosis not present

## 2023-01-29 DIAGNOSIS — R509 Fever, unspecified: Secondary | ICD-10-CM | POA: Diagnosis not present

## 2023-01-29 MED ORDER — MICONAZOLE NITRATE 2 % EX POWD: CUTANEOUS | 0 refills | Status: AC | PRN

## 2023-01-29 MED ORDER — OMEPRAZOLE 20 MG PO CPDR
20.0000 mg | DELAYED_RELEASE_CAPSULE | Freq: Every day | ORAL | 3 refills | Status: DC
Start: 2023-01-29 — End: 2023-12-20

## 2023-01-29 NOTE — Patient Instructions (Signed)
We have stopped the Topamax.   Once you're back to your normal self you can swim.  Follow up in 3-6 months.

## 2023-01-29 NOTE — Progress Notes (Signed)
Subjective:  Patient ID: Kimberly [name], female    DOB: September 29, 1950  Age: 72 y.o. MRN: [id number]  CC: Chief Complaint  Patient presents with   Hospitalization Follow-up    For kidney infection - had a fall, confusion, flank pain, shakiness , fever - finished abx , still some flank pain     HPI:  72 year old female presents for hospital follow-up.  Patient recently admitted to the hospital from 5/21 to 5/24.  Initially presented to the ER on 5/20.  Had fever and flank pain.  Also had a presyncopal event and fell and hit her head.  She was evaluated and thought to have pyelonephritis.  She was treated with antibiotics and discharged home.  She subsequently worsened and presented with encephalopathy.  She was subsequently admitted.  She also had a lumbar puncture which was not consistent with infection.  She was treated with empiric antibiotics.  Cultures returned negative.  Encephalopathy cleared and she was discharged home.  Patient reports that overall she is doing better.  She has had some fogginess and sensitivity to light and sound.  He is concerned that she may have had a concussion when she fell and hit her head.  She was recently prescribed Topamax.  I advised her that Topamax can cause cognitive dysfunction.  I have advised her to discontinue this medication.  She has had no further flank pain.  No fever.  Patient Active Problem List   Diagnosis Date Noted   Fever 01/16/2023   Mixed hyperlipidemia 08/24/2022   Genetic testing 10/25/2021   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) 09/05/2021   Vitamin D deficiency 09/05/2021   Breast cancer of upper-outer quadrant of right female breast (HCC) 08/31/2021   S/P mastectomy, right 07/29/2021   Nonalcoholic steatohepatitis ([facility]) 11/29/2020   Urinary incontinence 05/07/2020   Depression 12/29/2015   S/P knee replacement 09/02/2013   Essential hypertension, benign 07/06/2013   Hypothyroidism 07/06/2013    Social Hx    Social History   Socioeconomic History   Marital status: Married    Spouse name: Not on file   Number of children: 5   Years of education: Not on file   Highest education level: Not on file  Occupational History   Occupation: CNA    Employer: [employer]  Tobacco Use   Smoking status: Never   Smokeless tobacco: Never  Vaping Use   Vaping Use: Never used  Substance and Sexual Activity   Alcohol use: No   Drug use: No   Sexual activity: Yes    Birth control/protection: Surgical  Other Topics Concern   Not on file  Social History Narrative   Not on file   Social Determinants of Health   Financial Resource Strain: Low Risk  (01/23/2023)   Overall Financial Resource Strain (CARDIA)    Difficulty of Paying Living Expenses: Not hard at all  Food Insecurity: No Food Insecurity (01/23/2023)   Hunger Vital Sign    Worried About Running Out of Food in the Last Year: Never true    Ran Out of Food in the Last Year: Never true  Transportation Needs: No Transportation Needs (01/23/2023)   PRAPARE - Administrator, Civil Service (Medical): No    Lack of Transportation (Non-Medical): No  Physical Activity: Insufficiently Active (02/07/2022)   Exercise Vital Sign    Days of Exercise per Week: 2 days    Minutes of Exercise per Session: 60 min  Stress: No Stress Concern Present (  02/07/2022)   [place] [employer] - Occupational Stress Questionnaire    Feeling of Stress : Only a little  Social Connections: Socially Integrated (02/07/2022)   Social Connection and Isolation Panel [NHANES]    Frequency of Communication with Friends and Family: More than three times a week    Frequency of Social Gatherings with Friends and Family: More than three times a week    Attends Religious Services: More than 4 times per year    Active Member of [employer] or Organizations: Yes    Attends Engineer, structural: More than 4 times per year    Marital Status: Married     Review of Systems Per HPI  Objective:  BP 130/68   Pulse 64   Temp 97.7 F (36.5 C)   Ht 5\' 4"  (1.626 m)   Wt 183 lb (83 kg)   SpO2 98%   BMI 31.41 kg/m      01/29/2023   10:39 AM 01/19/2023    3:40 AM 01/18/2023    9:26 PM  BP/Weight  Systolic BP 130 148 165  Diastolic BP 68 77 77  Wt. (Lbs) 183    BMI 31.41 kg/m2      Physical Exam Vitals and nursing note reviewed.  Constitutional:      General: She is not in acute distress.    Appearance: Normal appearance.  HENT:     Head: Normocephalic and atraumatic.  Eyes:     General:        Right eye: No discharge.        Left eye: No discharge.     Conjunctiva/sclera: Conjunctivae normal.  Cardiovascular:     Rate and Rhythm: Normal rate and regular rhythm.  Pulmonary:     Effort: Pulmonary effort is normal.     Breath sounds: Normal breath sounds. No wheezing, rhonchi or rales.  Neurological:     Mental Status: She is alert.  Psychiatric:        Mood and Affect: Mood normal.        Behavior: Behavior normal.     Lab Results  Component Value Date   WBC 4.2 01/17/2023   HGB 10.7 (L) 01/17/2023   HCT 32.3 (L) 01/17/2023   PLT 171 01/17/2023   GLUCOSE 143 (H) 01/19/2023   CHOL 158 07/31/2022   TRIG 180 (H) 07/31/2022   HDL 39 (L) 07/31/2022   LDLCALC 88 07/31/2022   ALT 32 01/16/2023   AST 41 01/16/2023   NA 132 (L) 01/19/2023   K 3.7 01/19/2023   CL 102 01/19/2023   CREATININE 0.94 01/19/2023   BUN 14 01/19/2023   CO2 20 (L) 01/19/2023   TSH 0.490 01/16/2023   INR 1.2 01/16/2023   HGBA1C 6.9 (A) 12/25/2022   MICROALBUR 10 mg/L 08/09/2022     Assessment & Plan:   Problem List Items Addressed This Visit       Other   Fever - Primary    Fever, flank pain, and encephalopathy have all improved.  She is doing well at this time.  Discontinuing Topamax.  Supportive care.      Other Visit Diagnoses     Gastroesophageal reflux disease without esophagitis       Relevant Medications    omeprazole (PRILOSEC) 20 MG capsule       Meds ordered this encounter  Medications   omeprazole (PRILOSEC) 20 MG capsule    Sig: Take 1 capsule (20 mg total) by mouth at bedtime.  Dispense:  90 capsule    Refill:  3   miconazole (MICOTIN) 2 % powder    Sig: Apply topically as needed for itching.    Dispense:  70 g    Refill:  0    Sherisa Gilvin DO [facility]

## 2023-01-29 NOTE — Assessment & Plan Note (Signed)
Fever, flank pain, and encephalopathy have all improved.  She is doing well at this time.  Discontinuing Topamax.  Supportive care.

## 2023-02-14 ENCOUNTER — Ambulatory Visit (INDEPENDENT_AMBULATORY_CARE_PROVIDER_SITE_OTHER): Payer: Medicare Other | Admitting: Urology

## 2023-02-14 VITALS — BP 118/68 | HR 82 | Ht 64.0 in | Wt 183.0 lb

## 2023-02-14 DIAGNOSIS — R32 Unspecified urinary incontinence: Secondary | ICD-10-CM | POA: Diagnosis not present

## 2023-02-14 DIAGNOSIS — N3941 Urge incontinence: Secondary | ICD-10-CM

## 2023-02-14 DIAGNOSIS — N3000 Acute cystitis without hematuria: Secondary | ICD-10-CM

## 2023-02-14 LAB — URINALYSIS, ROUTINE W REFLEX MICROSCOPIC
Bilirubin, UA: NEGATIVE
Glucose, UA: NEGATIVE
Ketones, UA: NEGATIVE
Nitrite, UA: NEGATIVE
Protein,UA: NEGATIVE
RBC, UA: NEGATIVE
Specific Gravity, UA: 1.01 (ref 1.005–1.030)
Urobilinogen, Ur: 0.2 mg/dL (ref 0.2–1.0)
pH, UA: 6.5 (ref 5.0–7.5)

## 2023-02-14 LAB — MICROSCOPIC EXAMINATION: Epithelial Cells (non renal): >10 /hpf — AB (ref 0–10)

## 2023-02-14 LAB — BLADDER SCAN AMB NON-IMAGING: Scan Result: 10

## 2023-02-14 MED ORDER — MIRABEGRON ER 25 MG PO TB24
25.0000 mg | ORAL_TABLET | Freq: Every day | ORAL | 11 refills | Status: DC
Start: 2023-02-14 — End: 2023-05-02

## 2023-02-14 NOTE — Progress Notes (Signed)
02/14/2023 2:20 PM   Kimberly [name] October 16, 1950 [id number]  Referring provider: Tommie Sams, DO [address] [place],  [place] [address]  Followup urge incontinence   HPI: Ms Kimberly [name] is a 72yo here for followup for urge incontinence. She remains on Detrol LA 4mg  daily. She was hospitalized in 12/2022 for fever of unknown origin. Her LUTS have worsened since that time. She has urinary freqeuncy every 1-1.5 hours. She has urgency and occasional incontinence.    PMH: Past Medical History:  Diagnosis Date   Anxiety    Arthritis    Depression    Diabetes mellitus    Family history of breast cancer    Family history of colon cancer    Family history of prostate cancer    Family history of skin cancer    GERD (gastroesophageal reflux disease)    High blood pressure    Hypothyroid    Obesity    PONV (postoperative nausea and vomiting)     Surgical History: Past Surgical History:  Procedure Laterality Date   ABDOMINAL HYSTERECTOMY     [place] Right    CARPAL TUNNEL RELEASE Right    KNEE ARTHROSCOPY Right    MASTECTOMY MODIFIED RADICAL Right 07/29/2021   invasive ductal ca   THYROID SURGERY     removal   TOTAL KNEE ARTHROPLASTY Right 08/25/2013   Procedure: TOTAL KNEE ARTHROPLASTY;  Surgeon: Vickki Hearing, MD;  Location: [facility];  Service: Orthopedics;  Laterality: Right;   TOTAL KNEE ARTHROPLASTY Left 09/09/2014   Procedure: LEFT TOTAL KNEE ARTHROPLASTY;  Surgeon: Vickki Hearing, MD;  Location: [facility];  Service: Orthopedics;  Laterality: Left;    Home Medications:  Allergies as of 02/14/2023       Reactions   Ace Inhibitors Cough        Medication List        Accurate as of February 14, 2023  2:20 PM. If you have any questions, ask your nurse or doctor.          anastrozole 1 MG tablet Commonly known as: ARIMIDEX TAKE 1 TABLET DAILY   Dexcom G7 Sensor Misc by Does not apply route.   diclofenac Sodium 1 % Gel Commonly known as:  VOLTAREN Apply 2 g topically daily as needed (knee pain).   famotidine 40 MG tablet Commonly known as: PEPCID TAKE 1 TABLET DAILY   gabapentin 100 MG capsule Commonly known as: NEURONTIN Take 1 capsule (100 mg total) by mouth 3 (three) times daily.   glipiZIDE 5 MG 24 hr tablet Commonly known as: GLUCOTROL XL Take 1 tablet (5 mg total) by mouth daily with breakfast.   hydrochlorothiazide 25 MG tablet Commonly known as: HYDRODIURIL TAKE 1 TABLET DAILY   levothyroxine 125 MCG tablet Commonly known as: SYNTHROID Take 1 tablet (125 mcg total) by mouth daily before breakfast.   losartan 100 MG tablet Commonly known as: COZAAR TAKE 1 TABLET EVERY MORNING   Magnesium 250 MG Tabs Take 250 mg by mouth daily.   metFORMIN 500 MG 24 hr tablet Commonly known as: GLUCOPHAGE-XR Take 1 tablet (500 mg total) by mouth 2 (two) times daily with a meal.   metoprolol succinate 100 MG 24 hr tablet Commonly known as: TOPROL-XL TAKE 1 TABLET AT BEDTIME   WITH OR IMMEDIATELY        FOLLOWING A MEAL What changed: See the new instructions.   miconazole 2 % powder Commonly known as: MICOTIN Apply topically as needed for itching.  omeprazole 20 MG capsule Commonly known as: PRILOSEC Take 1 capsule (20 mg total) by mouth at bedtime.   tolterodine 4 MG 24 hr capsule Commonly known as: DETROL LA Take 1 capsule (4 mg total) by mouth daily.   topiramate 25 MG tablet Commonly known as: TOPAMAX Take daily for 5 days; then as needed daily for headaches.   Evaristo Bury FlexTouch 100 UNIT/ML FlexTouch Pen Generic drug: insulin degludec Inject 28 Units into the skin at bedtime.   UltiCare Mini Pen Needles 31G X 6 MM Misc Generic drug: Insulin Pen Needle USE ONCE DAILY AS DIRECTED.   B-D ULTRAFINE III SHORT PEN 31G X 8 MM Misc Generic drug: Insulin Pen Needle 1 each by Does not apply route as directed. Use to inject insulin once daily   venlafaxine XR 75 MG 24 hr capsule Commonly known as:  EFFEXOR-XR TAKE 1 CAPSULE BY MOUTH ONCE DAILY WITH BREAKFAST        Allergies:  Allergies  Allergen Reactions   Ace Inhibitors Cough    Family History: Family History  Problem Relation Age of Onset   Diabetes Mother    Hypertension Mother    Congestive Heart Failure Father    Hypertension Father    Skin cancer Father 91       squamous cell on lip; metastatic   Hypertension Brother    Skin cancer Brother        squamous cell   Breast cancer Paternal Aunt        dx 52s d. 61s   Prostate cancer Paternal Uncle        dx 62s   Colon cancer Maternal Grandmother        dx 47s d. 64s   Diabetes Other    Cancer Cousin        unk type; possibly pancreatic    Social History:  reports that she has never smoked. She has never used smokeless tobacco. She reports that she does not drink alcohol and does not use drugs.  ROS: All other review of systems were reviewed and are negative except what is noted above in HPI  Physical Exam: BP 118/68   Pulse 82   Ht 5\' 4"  (1.626 m)   Wt 183 lb (83 kg)   BMI 31.41 kg/m   Constitutional:  Alert and oriented, No acute distress. HEENT: [place] AT, moist mucus membranes.  Trachea midline, no masses. Cardiovascular: No clubbing, cyanosis, or edema. Respiratory: Normal respiratory effort, no increased work of breathing. GI: Abdomen is soft, nontender, nondistended, no abdominal masses GU: No CVA tenderness.  Lymph: No cervical or inguinal lymphadenopathy. Skin: No rashes, bruises or suspicious lesions. Neurologic: Grossly intact, no focal deficits, moving all 4 extremities. Psychiatric: Normal mood and affect.  Laboratory Data: Lab Results  Component Value Date   WBC 4.2 01/17/2023   HGB 10.7 (L) 01/17/2023   HCT 32.3 (L) 01/17/2023   MCV 95.8 01/17/2023   PLT 171 01/17/2023    Lab Results  Component Value Date   CREATININE 0.94 01/19/2023    No results found for: "PSA"  No results found for: "TESTOSTERONE"  Lab Results   Component Value Date   HGBA1C 6.9 (A) 12/25/2022    Urinalysis    Component Value Date/Time   COLORURINE YELLOW 01/16/2023 1706   APPEARANCEUR CLEAR 01/16/2023 1706   APPEARANCEUR Clear 02/13/2022 1413   LABSPEC 1.015 01/16/2023 1706   PHURINE 6.5 01/16/2023 1706   GLUCOSEU NEGATIVE 01/16/2023 1706   HGBUR NEGATIVE 01/16/2023  1706   BILIRUBINUR NEGATIVE 01/16/2023 1706   BILIRUBINUR Negative 02/13/2022 1413   KETONESUR NEGATIVE 01/16/2023 1706   PROTEINUR TRACE (A) 01/16/2023 1706   UROBILINOGEN 0.2 05/07/2020 1000   NITRITE NEGATIVE 01/16/2023 1706   LEUKOCYTESUR NEGATIVE 01/16/2023 1706    Lab Results  Component Value Date   LABMICR See below: 02/13/2022   WBCUA 0-5 02/13/2022   LABEPIT 0-10 02/13/2022   BACTERIA MANY (A) 01/16/2023    Pertinent Imaging:  No results found for this or any previous visit.  No results found for this or any previous visit.  No results found for this or any previous visit.  No results found for this or any previous visit.  Results for orders placed during the hospital encounter of 07/02/18  US RENAL  Narrative CLINICAL DATA:  Chronic renal disease.  EXAM: RENAL / URINARY TRACT ULTRASOUND COMPLETE  COMPARISON:  None.  FINDINGS: Right Kidney:  Renal measurements: 11.3 x 5.0 x 5.5 cm = volume: 161 mL . Echogenicity within normal limits. No mass or hydronephrosis visualized.  Left Kidney:  Renal measurements: 11.1 x 4.4 x 5.8 cm = volume: 147 mL. Echogenicity within normal limits. No mass or hydronephrosis visualized.  Bladder:  Appears normal for degree of bladder distention.  IMPRESSION: Negative exam.   Electronically Signed By: Drusilla Kanner M.D. On: 07/02/2018 16:13  No valid procedures specified. No results found for this or any previous visit.  No results found for this or any previous visit.   Assessment & Plan:    1. Urinary incontinence, unspecified type We will trial mirabegron 25mg   daily. Patient to call if she  - Urinalysis, Routine w reflex microscopic - BLADDER SCAN AMB NON-IMAGING   No follow-ups on file.  Wilkie Aye, MD  [facility] [place]

## 2023-02-14 NOTE — Progress Notes (Cosign Needed Addendum)
post void residual=10 °

## 2023-02-16 ENCOUNTER — Ambulatory Visit (INDEPENDENT_AMBULATORY_CARE_PROVIDER_SITE_OTHER): Payer: Medicare Other

## 2023-02-16 VITALS — Ht 64.0 in | Wt 180.0 lb

## 2023-02-16 DIAGNOSIS — Z0000 Encounter for general adult medical examination without abnormal findings: Secondary | ICD-10-CM

## 2023-02-16 NOTE — Progress Notes (Signed)
Subjective:   Kimberly [name] is a 72 y.o. female who presents for Medicare Annual (Subsequent) preventive examination.  Visit Complete: Virtual  I connected with  Randa Spike on 02/16/23 by a audio enabled telemedicine application and verified that I am speaking with the correct person using two identifiers.  Patient Location: Home  Provider Location: Home Office  I discussed the limitations of evaluation and management by telemedicine. The patient expressed understanding and agreed to proceed.  Patient Medicare AWV questionnaire was completed by the patient on n/a; I have confirmed that all information answered by patient is correct and no changes since this date.  Review of Systems     Cardiac Risk Factors include: advanced age (>59men, >46 women);diabetes mellitus;hypertension;obesity (BMI >30kg/m2)     Objective:    Today's Vitals   02/16/23 1301  Weight: 180 lb (81.6 kg)  Height: 5\' 4"  (1.626 m)   Body mass index is 30.9 kg/m.     02/16/2023    1:09 PM 01/16/2023   11:44 PM 01/16/2023    2:44 PM 01/15/2023    7:32 PM 12/19/2022   11:30 AM 02/07/2022    3:34 PM 01/16/2022   11:33 AM  Advanced Directives  Does Patient Have a Medical Advance Directive? No  Yes Yes No Yes No  Type of Advance Directive  Living will Living will Living will  Healthcare Power of [place];Living will   Copy of Healthcare Power of Attorney in Chart?      No - copy requested   Would patient like information on creating a medical advance directive? No - Patient declined    No - Patient declined  No - Patient declined    Current Medications (verified) Outpatient Encounter Medications as of 02/16/2023  Medication Sig   anastrozole (ARIMIDEX) 1 MG tablet TAKE 1 TABLET DAILY   Continuous Blood Gluc Sensor (DEXCOM G7 SENSOR) MISC by Does not apply route.   gabapentin (NEURONTIN) 100 MG capsule Take 1 capsule (100 mg total) by mouth 3 (three) times daily.   glipiZIDE (GLUCOTROL XL) 5 MG 24  hr tablet Take 1 tablet (5 mg total) by mouth daily with breakfast.   hydrochlorothiazide (HYDRODIURIL) 25 MG tablet TAKE 1 TABLET DAILY   insulin degludec (TRESIBA FLEXTOUCH) 100 UNIT/ML FlexTouch Pen Inject 28 Units into the skin at bedtime.   Insulin Pen Needle (B-D ULTRAFINE III SHORT PEN) 31G X 8 MM MISC 1 each by Does not apply route as directed. Use to inject insulin once daily   levothyroxine (SYNTHROID) 125 MCG tablet Take 1 tablet (125 mcg total) by mouth daily before breakfast.   losartan (COZAAR) 100 MG tablet TAKE 1 TABLET EVERY MORNING   metFORMIN (GLUCOPHAGE-XR) 500 MG 24 hr tablet Take 1 tablet (500 mg total) by mouth 2 (two) times daily with a meal.   metoprolol succinate (TOPROL-XL) 100 MG 24 hr tablet TAKE 1 TABLET AT BEDTIME   WITH OR IMMEDIATELY        FOLLOWING A MEAL (Patient taking differently: Take 100 mg by mouth daily. TAKE 1 TABLET AT BEDTIME   WITH OR IMMEDIATELY        FOLLOWING A MEAL)   miconazole (MICOTIN) 2 % powder Apply topically as needed for itching.   omeprazole (PRILOSEC) 20 MG capsule Take 1 capsule (20 mg total) by mouth at bedtime.   tolterodine (DETROL LA) 4 MG 24 hr capsule Take 1 capsule (4 mg total) by mouth daily.   ULTICARE MINI PEN NEEDLES 31G  X 6 MM MISC USE ONCE DAILY AS DIRECTED.   venlafaxine XR (EFFEXOR-XR) 75 MG 24 hr capsule TAKE 1 CAPSULE BY MOUTH ONCE DAILY WITH BREAKFAST   diclofenac Sodium (VOLTAREN) 1 % GEL Apply 2 g topically daily as needed (knee pain).   famotidine (PEPCID) 40 MG tablet TAKE 1 TABLET DAILY   Magnesium 250 MG TABS Take 250 mg by mouth daily.   mirabegron ER (MYRBETRIQ) 25 MG TB24 tablet Take 1 tablet (25 mg total) by mouth daily.   topiramate (TOPAMAX) 25 MG tablet Take daily for 5 days; then as needed daily for headaches. (Patient not taking: Reported on 02/14/2023)   Facility-Administered Encounter Medications as of 02/16/2023  Medication   bupivacaine liposome (EXPAREL) 1.3 % injection 266 mg    Allergies  (verified) Ace inhibitors   History: Past Medical History:  Diagnosis Date   Anxiety    Arthritis    Depression    Diabetes mellitus    Family history of breast cancer    Family history of colon cancer    Family history of prostate cancer    Family history of skin cancer    GERD (gastroesophageal reflux disease)    High blood pressure    Hypothyroid    Obesity    PONV (postoperative nausea and vomiting)    Past Surgical History:  Procedure Laterality Date   ABDOMINAL HYSTERECTOMY     [place] Right    CARPAL TUNNEL RELEASE Right    KNEE ARTHROSCOPY Right    MASTECTOMY MODIFIED RADICAL Right 07/29/2021   invasive ductal ca   THYROID SURGERY     removal   TOTAL KNEE ARTHROPLASTY Right 08/25/2013   Procedure: TOTAL KNEE ARTHROPLASTY;  Surgeon: Vickki Hearing, MD;  Location: [facility];  Service: Orthopedics;  Laterality: Right;   TOTAL KNEE ARTHROPLASTY Left 09/09/2014   Procedure: LEFT TOTAL KNEE ARTHROPLASTY;  Surgeon: Vickki Hearing, MD;  Location: [facility];  Service: Orthopedics;  Laterality: Left;   Family History  Problem Relation Age of Onset   Diabetes Mother    Hypertension Mother    Congestive Heart Failure Father    Hypertension Father    Skin cancer Father 37       squamous cell on lip; metastatic   Hypertension Brother    Skin cancer Brother        squamous cell   Breast cancer Paternal Aunt        dx 36s d. 49s   Prostate cancer Paternal Uncle        dx 7s   Colon cancer Maternal Grandmother        dx 43s d. 60s   Diabetes Other    Cancer Cousin        unk type; possibly pancreatic   Social History   Socioeconomic History   Marital status: Married    Spouse name: Not on file   Number of children: 5   Years of education: Not on file   Highest education level: Not on file  Occupational History   Occupation: CNA    Employer: [employer]  Tobacco Use   Smoking status: Never   Smokeless tobacco: Never  Vaping Use   Vaping Use: Never used   Substance and Sexual Activity   Alcohol use: No   Drug use: No   Sexual activity: Yes    Birth control/protection: Surgical  Other Topics Concern   Not on file  Social History Narrative   Not on file  Social Determinants of Health   Financial Resource Strain: Low Risk  (02/16/2023)   Overall Financial Resource Strain (CARDIA)    Difficulty of Paying Living Expenses: Not hard at all  Food Insecurity: No Food Insecurity (02/16/2023)   Hunger Vital Sign    Worried About Running Out of Food in the Last Year: Never true    Ran Out of Food in the Last Year: Never true  Transportation Needs: No Transportation Needs (02/16/2023)   PRAPARE - Administrator, Civil Service (Medical): No    Lack of Transportation (Non-Medical): No  Physical Activity: Insufficiently Active (02/16/2023)   Exercise Vital Sign    Days of Exercise per Week: 2 days    Minutes of Exercise per Session: 30 min  Stress: No Stress Concern Present (02/16/2023)   [employer] of Occupational Health - Occupational Stress Questionnaire    Feeling of Stress : Not at all  Social Connections: Socially Integrated (02/16/2023)   Social Connection and Isolation Panel [NHANES]    Frequency of Communication with Friends and Family: More than three times a week    Frequency of Social Gatherings with Friends and Family: More than three times a week    Attends Religious Services: More than 4 times per year    Active Member of [employer] or Organizations: Yes    Attends Engineer, structural: More than 4 times per year    Marital Status: Married    Tobacco Counseling Counseling given: Yes   Clinical Intake:  Pre-visit preparation completed: Yes  Pain : No/denies pain     BMI - recorded: 30.9 Nutritional Status: BMI > 30  Obese Nutritional Risks: None Diabetes: Yes CBG done?: No Did pt. bring in CBG monitor from home?: No  How often do you need to have someone help you when you read instructions,  pamphlets, or other written materials from your doctor or pharmacy?: 1 - Never  Interpreter Needed?: No  Information entered by :: Abby Paislea Hatton CMA   Activities of Daily Living    02/16/2023    1:10 PM 01/16/2023   11:44 PM  In your present state of health, do you have any difficulty performing the following activities:  Hearing? 0 0  Vision? 0 0  Difficulty concentrating or making decisions? 0 0  Walking or climbing stairs? 0 0  Dressing or bathing? 0 0  Doing errands, shopping? 0 0  Preparing Food and eating ? N   Using the Toilet? N   In the past six months, have you accidently leaked urine? Y   Do you have problems with loss of bowel control? N   Managing your Medications? N   Managing your Finances? N   Housekeeping or managing your Housekeeping? N     Patient Care Team: Tommie Sams, DO as PCP - General (Family Medicine) Doreatha Massed, MD as Medical Oncologist (Medical Oncology)  Indicate any recent Medical Services you may have received from other than [facility] providers in the past year (date may be approximate).     Assessment:   This is a routine wellness examination for [place].  Hearing/Vision screen Hearing Screening - Comments:: Patient denies any hearing difficulties.   Vision Screening - Comments:: Wears rx glasses - up to date with routine eye exams    Dietary issues and exercise activities discussed:     Goals Addressed   None    Depression Screen    02/16/2023    1:07 PM 01/29/2023   10:45  AM 08/24/2022   10:43 AM 02/07/2022    3:31 PM 04/11/2021   10:00 AM 10/14/2020   11:28 AM 07/13/2020    8:41 AM  PHQ 2/9 Scores  PHQ - 2 Score 0 0 3 1 0 0 4  PHQ- 9 Score 0 5 8   1 10     Fall Risk    02/16/2023    1:09 PM 02/07/2022    3:34 PM 09/05/2021    9:14 AM 07/05/2021    2:56 PM 04/11/2021   10:00 AM  Fall Risk   Falls in the past year? 1 0 0 0 0  Number falls in past yr: 0 0 0  0  Injury with Fall? 1 0 0  0  Risk for fall due to : No  Fall Risks No Fall Risks No Fall Risks  No Fall Risks  Follow up  Falls prevention discussed Falls evaluation completed Falls evaluation completed Falls evaluation completed    MEDICARE RISK AT HOME:  Medicare Risk at Home - 02/16/23 1306     Any stairs in or around the home? Yes    If so, are there any without handrails? No    Home free of loose throw rugs in walkways, pet beds, electrical cords, etc? Yes    Adequate lighting in your home to reduce risk of falls? Yes    Life alert? No    Use of a cane, walker or w/c? No    Grab bars in the bathroom? Yes    Shower chair or bench in shower? No    Elevated toilet seat or a handicapped toilet? No             TIMED UP AND GO:  Was the test performed?  No    Cognitive Function:        02/16/2023    1:11 PM 02/07/2022    3:40 PM  6CIT Screen  What Year? 0 points 0 points  What month? 0 points 0 points  What time? 0 points 0 points  Count back from 20 0 points 0 points  Months in reverse 0 points 0 points  Repeat phrase 2 points 0 points  Total Score 2 points 0 points    Immunizations Immunization History  Administered Date(s) Administered   Fluad Quad(high Dose 65+) 07/12/2020   Influenza,inj,Quad PF,6+ Mos 04/30/2018, 08/24/2022   Influenza-Unspecified 05/21/2016, 05/03/2017, 06/21/2019   Moderna Sars-Covid-2 Vaccination 10/07/2019, 11/05/2019, 08/02/2020   Pneumococcal Conjugate-13 03/03/2014, 05/30/2017   Pneumococcal Polysaccharide-23 05/27/2010    TDAP status: Due, Education has been provided regarding the importance of this vaccine. Advised may receive this vaccine at local pharmacy or Health Dept. Aware to provide a copy of the vaccination record if obtained from local pharmacy or Health Dept. Verbalized acceptance and understanding.  Flu Vaccine status: Up to date  Pneumococcal vaccine status: Due, Education has been provided regarding the importance of this vaccine. Advised may receive this vaccine at local  pharmacy or Health Dept. Aware to provide a copy of the vaccination record if obtained from local pharmacy or Health Dept. Verbalized acceptance and understanding.  Covid-19 vaccine status: Information provided on how to obtain vaccines.   Qualifies for Shingles Vaccine? Yes   Zostavax completed No   Shingrix Completed?: No.    Education has been provided regarding the importance of this vaccine. Patient has been advised to call insurance company to determine out of pocket expense if they have not yet received this vaccine. Advised may also  receive vaccine at local pharmacy or Health Dept. Verbalized acceptance and understanding.  Screening Tests Health Maintenance  Topic Date Due   DTaP/Tdap/Td (1 - Tdap) Never done   Zoster Vaccines- Shingrix (1 of 2) Never done   Pneumonia Vaccine 62+ Years old (3 of 3 - PPSV23 or PCV20) 05/30/2018   COVID-19 Vaccine (4 - 2023-24 season) 04/28/2022   INFLUENZA VACCINE  03/29/2023   OPHTHALMOLOGY EXAM  06/01/2023   HEMOGLOBIN A1C  06/26/2023   Diabetic kidney evaluation - Urine ACR  08/10/2023   FOOT EXAM  12/25/2023   Diabetic kidney evaluation - eGFR measurement  01/19/2024   Medicare Annual Wellness (AWV)  02/16/2024   MAMMOGRAM  06/05/2024   Colonoscopy  08/17/2027   DEXA SCAN  Completed   Hepatitis C Screening  Completed   HPV VACCINES  Aged Out    Health Maintenance  Health Maintenance Due  Topic Date Due   DTaP/Tdap/Td (1 - Tdap) Never done   Zoster Vaccines- Shingrix (1 of 2) Never done   Pneumonia Vaccine 64+ Years old (3 of 3 - PPSV23 or PCV20) 05/30/2018   COVID-19 Vaccine (4 - 2023-24 season) 04/28/2022    Colorectal cancer screening: Type of screening: Colonoscopy. Completed 08/16/2022. Repeat every 5 years  Mammogram status: Completed 06/05/2022. Repeat every year  Bone Density status: Completed 09/12/2021. Results reflect: Bone density results: NORMAL. Repeat every 5 years.  Lung Cancer Screening: (Low Dose CT Chest  recommended if Age 54-80 years, 20 pack-year currently smoking OR have quit w/in 15years.) does not qualify.   Lung Cancer Screening Referral: n/a   Additional Screening:  Hepatitis C Screening: does not qualify; Completed 07/26/2015  Vision Screening: Recommended annual ophthalmology exams for early detection of glaucoma and other disorders of the eye. Is the patient up to date with their annual eye exam?  Yes  Who is the provider or what is the name of the office in which the patient attends annual eye exams? [facility] If pt is not established with a provider, would they like to be referred to a provider to establish care? No .   Dental Screening: Recommended annual dental exams for proper oral hygiene  Nutrition Risk Assessment:  Has the patient had any N/V/D within the last 2 months?  No  Does the patient have any non-healing wounds?  No  Has the patient had any unintentional weight loss or weight gain?  No   Diabetes:  Is the patient diabetic?  Yes  If diabetic, was a CBG obtained today?  No  Did the patient bring in their glucometer from home?  No  How often do you monitor your CBG's? Continuous monitor.   Financial Strains and Diabetes Management:  Are you having any financial strains with the device, your supplies or your medication? No .  Does the patient want to be seen by Chronic Care Management for management of their diabetes?  No  Would the patient like to be referred to a Nutritionist or for Diabetic Management?  No   Diabetic Exams: Up to date on eye exam  Diabetic Foot Exam: Diabetic Foot Exam: Completed 12/25/2022  Community Resource Referral / Chronic Care Management: CRR required this visit?  No   CCM required this visit?  No     Plan:     I have personally reviewed and noted the following in the patient's chart:   Medical and social history Use of alcohol, tobacco or illicit drugs  Current medications and supplements including  opioid  prescriptions. Patient is not currently taking opioid prescriptions. Functional ability and status Nutritional status Physical activity Advanced directives List of other physicians Hospitalizations, surgeries, and ER visits in previous 12 months Vitals Screenings to include cognitive, depression, and falls Referrals and appointments  In addition, I have reviewed and discussed with patient certain preventive protocols, quality metrics, and best practice recommendations. A written personalized care plan for preventive services as well as general preventive health recommendations were provided to patient.   Because this visit was a virtual/telehealth visit,  certain criteria was not obtained, such a blood pressure, CBG if patient is a diabetic, and timed up and go.   Any medications not marked as taking were not mentioned by the patient (or their caregiver if applicable) when reconciling the medications.    Jordan Hawks Jakiah Bienaime, CMA   02/16/2023   After Visit Summary: (MyChart) Due to this being a telephonic visit, the after visit summary with patients personalized plan was offered to patient via MyChart   Nurse Notes: utd

## 2023-02-16 NOTE — Patient Instructions (Signed)
Kimberly [name] , Thank you for taking time to come for your Medicare Wellness Visit. I appreciate your ongoing commitment to your health goals. Please review the following plan we discussed and let me know if I can assist you in the future.   These are the goals we discussed:  Goals      Exercise 3x per week (30 min per time)     Continue to exercise and stay healthy.         This is a list of the screening recommended for you and due dates:  Health Maintenance  Topic Date Due   DTaP/Tdap/Td vaccine (1 - Tdap) Never done   Zoster (Shingles) Vaccine (1 of 2) Never done   Pneumonia Vaccine (3 of 3 - PPSV23 or PCV20) 05/30/2018   COVID-19 Vaccine (4 - 2023-24 season) 04/28/2022   Flu Shot  03/29/2023   Eye exam for diabetics  06/01/2023   Hemoglobin A1C  06/26/2023   Yearly kidney health urinalysis for diabetes  08/10/2023   Complete foot exam   12/25/2023   Yearly kidney function blood test for diabetes  01/19/2024   Medicare Annual Wellness Visit  02/16/2024   Mammogram  06/05/2024   Colon Cancer Screening  08/17/2027   DEXA scan (bone density measurement)  Completed   Hepatitis C Screening  Completed   HPV Vaccine  Aged Out    Advanced directives: Advance directive discussed with you today. Even though you declined this today, please call our office should you change your mind, and we can give you the proper paperwork for you to fill out. Advance care planning is a way to make decisions about medical care that fits your values in case you are ever unable to make these decisions for yourself.  Information on Advanced Care Planning can be found at [facility] [place] Advance Health Care Directives Advance Health Care Directives ([email or web address]    Conditions/risks identified: Aim for 30 minutes of exercise or brisk walking, 6-8 glasses of water, and 5 servings of fruits and vegetables each day.   Next appointment: VIRTUAL/TELEPHONE APPOINTMENT Follow up in one year for  your annual wellness visit  February 22, 2024 at 1pm   Preventive Care 65 Years and Older, Female Preventive care refers to lifestyle choices and visits with your health care provider that can promote health and wellness. What does preventive care include? A yearly physical exam. This is also called an annual well check. Dental exams once or twice a year. Routine eye exams. Ask your health care provider how often you should have your eyes checked. Personal lifestyle choices, including: Daily care of your teeth and gums. Regular physical activity. Eating a healthy diet. Avoiding tobacco and drug use. Limiting alcohol use. Practicing safe sex. Taking low-dose aspirin every day. Taking vitamin and mineral supplements as recommended by your health care provider. What happens during an annual well check? The services and screenings done by your health care provider during your annual well check will depend on your age, overall health, lifestyle risk factors, and family history of disease. Counseling  Your health care provider may ask you questions about your: Alcohol use. Tobacco use. Drug use. Emotional well-being. Home and relationship well-being. Sexual activity. Eating habits. History of falls. Memory and ability to understand (cognition). Work and work Astronomer. Reproductive health. Screening  You may have the following tests or measurements: Height, weight, and BMI. Blood pressure. Lipid and cholesterol levels. These may be checked every 5 years, or more  frequently if you are over 69 years old. Skin check. Lung cancer screening. You may have this screening every year starting at age 32 if you have a 30-pack-year history of smoking and currently smoke or have quit within the past 15 years. Fecal occult blood test (FOBT) of the stool. You may have this test every year starting at age 98. Flexible sigmoidoscopy or colonoscopy. You may have a sigmoidoscopy every 5 years or a  colonoscopy every 10 years starting at age 68. Hepatitis C blood test. Hepatitis B blood test. Sexually transmitted disease (STD) testing. Diabetes screening. This is done by checking your blood sugar (glucose) after you have not eaten for a while (fasting). You may have this done every 1-3 years. Bone density scan. This is done to screen for osteoporosis. You may have this done starting at age 31. Mammogram. This may be done every 1-2 years. Talk to your health care provider about how often you should have regular mammograms. Talk with your health care provider about your test results, treatment options, and if necessary, the need for more tests. Vaccines  Your health care provider may recommend certain vaccines, such as: Influenza vaccine. This is recommended every year. Tetanus, diphtheria, and acellular pertussis (Tdap, Td) vaccine. You may need a Td booster every 10 years. Zoster vaccine. You may need this after age 9. Pneumococcal 13-valent conjugate (PCV13) vaccine. One dose is recommended after age 44. Pneumococcal polysaccharide (PPSV23) vaccine. One dose is recommended after age 61. Talk to your health care provider about which screenings and vaccines you need and how often you need them. This information is not intended to replace advice given to you by your health care provider. Make sure you discuss any questions you have with your health care provider. Document Released: 09/10/2015 Document Revised: 05/03/2016 Document Reviewed: 06/15/2015 Elsevier Interactive Patient Education  2017 [employer].  Fall Prevention in the Home Falls can cause injuries. They can happen to people of all ages. There are many things you can do to make your home safe and to help prevent falls. What can I do on the outside of my home? Regularly fix the edges of walkways and driveways and fix any cracks. Remove anything that might make you trip as you walk through a door, such as a raised step or  threshold. Trim any bushes or trees on the path to your home. Use bright outdoor lighting. Clear any walking paths of anything that might make someone trip, such as rocks or tools. Regularly check to see if handrails are loose or broken. Make sure that both sides of any steps have handrails. Any raised decks and porches should have guardrails on the edges. Have any leaves, snow, or ice cleared regularly. Use sand or salt on walking paths during winter. Clean up any spills in your garage right away. This includes oil or grease spills. What can I do in the bathroom? Use night lights. Install grab bars by the toilet and in the tub and shower. Do not use towel bars as grab bars. Use non-skid mats or decals in the tub or shower. If you need to sit down in the shower, use a plastic, non-slip stool. Keep the floor dry. Clean up any water that spills on the floor as soon as it happens. Remove soap buildup in the tub or shower regularly. Attach bath mats securely with double-sided non-slip rug tape. Do not have throw rugs and other things on the floor that can make you trip. What can  I do in the bedroom? Use night lights. Make sure that you have a light by your bed that is easy to reach. Do not use any sheets or blankets that are too big for your bed. They should not hang down onto the floor. Have a firm chair that has side arms. You can use this for support while you get dressed. Do not have throw rugs and other things on the floor that can make you trip. What can I do in the kitchen? Clean up any spills right away. Avoid walking on wet floors. Keep items that you use a lot in easy-to-reach places. If you need to reach something above you, use a strong step stool that has a grab bar. Keep electrical cords out of the way. Do not use floor polish or wax that makes floors slippery. If you must use wax, use non-skid floor wax. Do not have throw rugs and other things on the floor that can make you  trip. What can I do with my stairs? Do not leave any items on the stairs. Make sure that there are handrails on both sides of the stairs and use them. Fix handrails that are broken or loose. Make sure that handrails are as long as the stairways. Check any carpeting to make sure that it is firmly attached to the stairs. Fix any carpet that is loose or worn. Avoid having throw rugs at the top or bottom of the stairs. If you do have throw rugs, attach them to the floor with carpet tape. Make sure that you have a light switch at the top of the stairs and the bottom of the stairs. If you do not have them, ask someone to add them for you. What else can I do to help prevent falls? Wear shoes that: Do not have high heels. Have rubber bottoms. Are comfortable and fit you well. Are closed at the toe. Do not wear sandals. If you use a stepladder: Make sure that it is fully opened. Do not climb a closed stepladder. Make sure that both sides of the stepladder are locked into place. Ask someone to hold it for you, if possible. Clearly mark and make sure that you can see: Any grab bars or handrails. First and last steps. Where the edge of each step is. Use tools that help you move around (mobility aids) if they are needed. These include: Canes. Walkers. Scooters. Crutches. Turn on the lights when you go into a dark area. Replace any light bulbs as soon as they burn out. Set up your furniture so you have a clear path. Avoid moving your furniture around. If any of your floors are uneven, fix them. If there are any pets around you, be aware of where they are. Review your medicines with your doctor. Some medicines can make you feel dizzy. This can increase your chance of falling. Ask your doctor what other things that you can do to help prevent falls. This information is not intended to replace advice given to you by your health care provider. Make sure you discuss any questions you have with your  health care provider. Document Released: 06/10/2009 Document Revised: 01/20/2016 Document Reviewed: 09/18/2014 Elsevier Interactive Patient Education  2017 [employer].

## 2023-02-17 LAB — URINE CULTURE

## 2023-02-19 ENCOUNTER — Telehealth: Payer: Self-pay

## 2023-02-19 MED ORDER — NITROFURANTOIN MONOHYD MACRO 100 MG PO CAPS: 100.0000 mg | ORAL_CAPSULE | Freq: Two times a day (BID) | ORAL | 0 refills | Status: DC

## 2023-02-19 NOTE — Telephone Encounter (Signed)
Patient called and made aware of positive urine culture and Macrobid sent to pharmacy. 

## 2023-02-20 ENCOUNTER — Encounter: Payer: Self-pay | Admitting: Urology

## 2023-02-20 NOTE — Patient Instructions (Signed)

## 2023-02-26 ENCOUNTER — Ambulatory Visit: Payer: Medicare Other | Admitting: Family Medicine

## 2023-03-07 ENCOUNTER — Telehealth: Payer: Self-pay

## 2023-03-07 NOTE — Telephone Encounter (Addendum)
Patient having:  Frequent urination Burning Incontinence when standing   Patient was given an appointment July 19.

## 2023-03-07 NOTE — Telephone Encounter (Signed)
Patient scheduled 7/12

## 2023-03-09 ENCOUNTER — Telehealth: Payer: Self-pay

## 2023-03-09 ENCOUNTER — Ambulatory Visit: Payer: Medicare Other | Admitting: Urology

## 2023-03-09 ENCOUNTER — Ambulatory Visit (INDEPENDENT_AMBULATORY_CARE_PROVIDER_SITE_OTHER): Payer: Medicare Other

## 2023-03-09 DIAGNOSIS — N390 Urinary tract infection, site not specified: Secondary | ICD-10-CM | POA: Diagnosis not present

## 2023-03-09 DIAGNOSIS — R399 Unspecified symptoms and signs involving the genitourinary system: Secondary | ICD-10-CM | POA: Diagnosis not present

## 2023-03-09 LAB — URINALYSIS, ROUTINE W REFLEX MICROSCOPIC
Bilirubin, UA: NEGATIVE
Glucose, UA: NEGATIVE
Ketones, UA: NEGATIVE
Nitrite, UA: NEGATIVE
Protein,UA: NEGATIVE
Specific Gravity, UA: 1.01 (ref 1.005–1.030)
Urobilinogen, Ur: 0.2 mg/dL (ref 0.2–1.0)
pH, UA: 6 (ref 5.0–7.5)

## 2023-03-09 LAB — MICROSCOPIC EXAMINATION

## 2023-03-09 MED ORDER — SULFAMETHOXAZOLE-TRIMETHOPRIM 800-160 MG PO TABS
1.0000 | ORAL_TABLET | Freq: Two times a day (BID) | ORAL | 0 refills | Status: DC
Start: 2023-03-09 — End: 2023-03-19

## 2023-03-09 NOTE — Telephone Encounter (Signed)
Patient called and made aware of positive urinalysis, Bactrim started 03/09/23 per Dr. Ronne Binning. Patient made aware of pending culture and voiced understanding.

## 2023-03-09 NOTE — Progress Notes (Signed)
Patient presents today with complaints of  UTI symptom  .  UA and Culture done today.  Dr. Ronne Binning reviewed results and Bactrim DS 800-160 mg BID X 7 days .  Patient aware of MD recommendations and that we will reach out with culture results.      ZOXWRUEA, CMA

## 2023-03-09 NOTE — Telephone Encounter (Signed)
Patient requested that the antibiotic be called into the below pharmacy for this one time.  Pharmacy: [facility] [address] [place], [place] [address] [phone]  Thank you

## 2023-03-12 ENCOUNTER — Other Ambulatory Visit: Payer: Self-pay | Admitting: Family Medicine

## 2023-03-12 DIAGNOSIS — I10 Essential (primary) hypertension: Secondary | ICD-10-CM

## 2023-03-13 LAB — URINE CULTURE

## 2023-03-16 ENCOUNTER — Ambulatory Visit: Payer: Medicare Other | Admitting: Urology

## 2023-03-19 ENCOUNTER — Inpatient Hospital Stay (HOSPITAL_COMMUNITY)
Admission: EM | Admit: 2023-03-19 | Discharge: 2023-03-28 | DRG: 071 | Disposition: A | Discharge status: Home Health | Payer: Medicare Other | Attending: Internal Medicine | Admitting: Internal Medicine

## 2023-03-19 ENCOUNTER — Encounter (HOSPITAL_COMMUNITY): Payer: Self-pay

## 2023-03-19 ENCOUNTER — Emergency Department (HOSPITAL_COMMUNITY): Payer: Medicare Other

## 2023-03-19 ENCOUNTER — Other Ambulatory Visit: Payer: Self-pay

## 2023-03-19 ENCOUNTER — Telehealth: Payer: Self-pay

## 2023-03-19 DIAGNOSIS — K219 Gastro-esophageal reflux disease without esophagitis: Secondary | ICD-10-CM | POA: Diagnosis present

## 2023-03-19 DIAGNOSIS — Z170 Estrogen receptor positive status [ER+]: Secondary | ICD-10-CM

## 2023-03-19 DIAGNOSIS — G0490 Encephalitis and encephalomyelitis, unspecified: Secondary | ICD-10-CM | POA: Diagnosis present

## 2023-03-19 DIAGNOSIS — Z6833 Body mass index (BMI) 33.0-33.9, adult: Secondary | ICD-10-CM | POA: Diagnosis not present

## 2023-03-19 DIAGNOSIS — I10 Essential (primary) hypertension: Secondary | ICD-10-CM | POA: Diagnosis not present

## 2023-03-19 DIAGNOSIS — A419 Sepsis, unspecified organism: Secondary | ICD-10-CM

## 2023-03-19 DIAGNOSIS — Z79890 Hormone replacement therapy (postmenopausal): Secondary | ICD-10-CM

## 2023-03-19 DIAGNOSIS — E039 Hypothyroidism, unspecified: Secondary | ICD-10-CM | POA: Diagnosis not present

## 2023-03-19 DIAGNOSIS — Z0389 Encounter for observation for other suspected diseases and conditions ruled out: Secondary | ICD-10-CM | POA: Diagnosis not present

## 2023-03-19 DIAGNOSIS — Z87440 Personal history of urinary (tract) infections: Secondary | ICD-10-CM

## 2023-03-19 DIAGNOSIS — G9341 Metabolic encephalopathy: Principal | ICD-10-CM | POA: Diagnosis present

## 2023-03-19 DIAGNOSIS — E860 Dehydration: Secondary | ICD-10-CM | POA: Diagnosis not present

## 2023-03-19 DIAGNOSIS — E1122 Type 2 diabetes mellitus with diabetic chronic kidney disease: Secondary | ICD-10-CM | POA: Diagnosis present

## 2023-03-19 DIAGNOSIS — Z808 Family history of malignant neoplasm of other organs or systems: Secondary | ICD-10-CM

## 2023-03-19 DIAGNOSIS — K7581 Nonalcoholic steatohepatitis (NASH): Secondary | ICD-10-CM | POA: Diagnosis present

## 2023-03-19 DIAGNOSIS — I6782 Cerebral ischemia: Secondary | ICD-10-CM | POA: Diagnosis not present

## 2023-03-19 DIAGNOSIS — Z853 Personal history of malignant neoplasm of breast: Secondary | ICD-10-CM

## 2023-03-19 DIAGNOSIS — Z7984 Long term (current) use of oral hypoglycemic drugs: Secondary | ICD-10-CM

## 2023-03-19 DIAGNOSIS — Z96653 Presence of artificial knee joint, bilateral: Secondary | ICD-10-CM | POA: Diagnosis present

## 2023-03-19 DIAGNOSIS — N179 Acute kidney failure, unspecified: Secondary | ICD-10-CM | POA: Diagnosis present

## 2023-03-19 DIAGNOSIS — Z833 Family history of diabetes mellitus: Secondary | ICD-10-CM

## 2023-03-19 DIAGNOSIS — Z79899 Other long term (current) drug therapy: Secondary | ICD-10-CM

## 2023-03-19 DIAGNOSIS — R61 Generalized hyperhidrosis: Secondary | ICD-10-CM | POA: Diagnosis not present

## 2023-03-19 DIAGNOSIS — Z800 Family history of malignant neoplasm of digestive organs: Secondary | ICD-10-CM

## 2023-03-19 DIAGNOSIS — E1129 Type 2 diabetes mellitus with other diabetic kidney complication: Secondary | ICD-10-CM | POA: Diagnosis present

## 2023-03-19 DIAGNOSIS — N182 Chronic kidney disease, stage 2 (mild): Secondary | ICD-10-CM | POA: Diagnosis present

## 2023-03-19 DIAGNOSIS — E782 Mixed hyperlipidemia: Secondary | ICD-10-CM | POA: Diagnosis present

## 2023-03-19 DIAGNOSIS — Z1152 Encounter for screening for COVID-19: Secondary | ICD-10-CM | POA: Diagnosis not present

## 2023-03-19 DIAGNOSIS — C50411 Malignant neoplasm of upper-outer quadrant of right female breast: Secondary | ICD-10-CM | POA: Diagnosis not present

## 2023-03-19 DIAGNOSIS — M1990 Unspecified osteoarthritis, unspecified site: Secondary | ICD-10-CM | POA: Diagnosis present

## 2023-03-19 DIAGNOSIS — R410 Disorientation, unspecified: Secondary | ICD-10-CM | POA: Diagnosis not present

## 2023-03-19 DIAGNOSIS — Z8249 Family history of ischemic heart disease and other diseases of the circulatory system: Secondary | ICD-10-CM

## 2023-03-19 DIAGNOSIS — E876 Hypokalemia: Secondary | ICD-10-CM | POA: Diagnosis present

## 2023-03-19 DIAGNOSIS — I129 Hypertensive chronic kidney disease with stage 1 through stage 4 chronic kidney disease, or unspecified chronic kidney disease: Secondary | ICD-10-CM | POA: Diagnosis present

## 2023-03-19 DIAGNOSIS — F32A Depression, unspecified: Secondary | ICD-10-CM | POA: Diagnosis present

## 2023-03-19 DIAGNOSIS — E871 Hypo-osmolality and hyponatremia: Secondary | ICD-10-CM | POA: Diagnosis not present

## 2023-03-19 DIAGNOSIS — Z794 Long term (current) use of insulin: Secondary | ICD-10-CM | POA: Diagnosis not present

## 2023-03-19 DIAGNOSIS — Z79811 Long term (current) use of aromatase inhibitors: Secondary | ICD-10-CM

## 2023-03-19 DIAGNOSIS — D61818 Other pancytopenia: Secondary | ICD-10-CM | POA: Diagnosis not present

## 2023-03-19 DIAGNOSIS — Z803 Family history of malignant neoplasm of breast: Secondary | ICD-10-CM

## 2023-03-19 DIAGNOSIS — N1831 Chronic kidney disease, stage 3a: Secondary | ICD-10-CM

## 2023-03-19 DIAGNOSIS — E669 Obesity, unspecified: Secondary | ICD-10-CM | POA: Diagnosis not present

## 2023-03-19 DIAGNOSIS — Z90710 Acquired absence of both cervix and uterus: Secondary | ICD-10-CM

## 2023-03-19 DIAGNOSIS — R4182 Altered mental status, unspecified: Secondary | ICD-10-CM | POA: Diagnosis not present

## 2023-03-19 DIAGNOSIS — R509 Fever, unspecified: Secondary | ICD-10-CM | POA: Diagnosis not present

## 2023-03-19 DIAGNOSIS — Z888 Allergy status to other drugs, medicaments and biological substances status: Secondary | ICD-10-CM

## 2023-03-19 DIAGNOSIS — Z9013 Acquired absence of bilateral breasts and nipples: Secondary | ICD-10-CM

## 2023-03-19 DIAGNOSIS — E66811 Obesity, class 1: Secondary | ICD-10-CM | POA: Diagnosis present

## 2023-03-19 DIAGNOSIS — R109 Unspecified abdominal pain: Secondary | ICD-10-CM | POA: Diagnosis not present

## 2023-03-19 DIAGNOSIS — F320 Major depressive disorder, single episode, mild: Secondary | ICD-10-CM

## 2023-03-19 DIAGNOSIS — I728 Aneurysm of other specified arteries: Secondary | ICD-10-CM | POA: Diagnosis not present

## 2023-03-19 LAB — COMPREHENSIVE METABOLIC PANEL
ALT: 23 U/L (ref 0–44)
AST: 27 U/L (ref 15–41)
Albumin: 3.6 g/dL (ref 3.5–5.0)
Alkaline Phosphatase: 86 U/L (ref 38–126)
Anion gap: 9 (ref 5–15)
BUN: 35 mg/dL — ABNORMAL HIGH (ref 8–23)
CO2: 22 mmol/L (ref 22–32)
Calcium: 9.5 mg/dL (ref 8.9–10.3)
Chloride: 92 mmol/L — ABNORMAL LOW (ref 98–111)
Creatinine, Ser: 1.73 mg/dL — ABNORMAL HIGH (ref 0.44–1.00)
GFR, Estimated: 31 mL/min — ABNORMAL LOW (ref >60)
Glucose, Bld: 111 mg/dL — ABNORMAL HIGH (ref 70–99)
Potassium: 4.6 mmol/L (ref 3.5–5.1)
Sodium: 123 mmol/L — ABNORMAL LOW (ref 135–145)
Total Bilirubin: 0.8 mg/dL (ref 0.3–1.2)
Total Protein: 8 g/dL (ref 6.5–8.1)

## 2023-03-19 LAB — CBC WITH DIFFERENTIAL/PLATELET
Abs Immature Granulocytes: 0.02 10*3/uL (ref 0.00–0.07)
Basophils Absolute: 0.1 10*3/uL (ref 0.0–0.1)
Basophils Relative: 1 %
Eosinophils Absolute: 0.3 10*3/uL (ref 0.0–0.5)
Eosinophils Relative: 3 %
HCT: 34.9 % — ABNORMAL LOW (ref 36.0–46.0)
Hemoglobin: 12.1 g/dL (ref 12.0–15.0)
Immature Granulocytes: 0 %
Lymphocytes Relative: 31 %
Lymphs Abs: 2.4 10*3/uL (ref 0.7–4.0)
MCH: 32 pg (ref 26.0–34.0)
MCHC: 34.7 g/dL (ref 30.0–36.0)
MCV: 92.3 fL (ref 80.0–100.0)
Monocytes Absolute: 0.9 10*3/uL (ref 0.1–1.0)
Monocytes Relative: 11 %
Neutro Abs: 4.1 10*3/uL (ref 1.7–7.7)
Neutrophils Relative %: 54 %
Platelets: 214 10*3/uL (ref 150–400)
RBC: 3.78 MIL/uL — ABNORMAL LOW (ref 3.87–5.11)
RDW: 12.7 % (ref 11.5–15.5)
WBC: 7.7 10*3/uL (ref 4.0–10.5)
nRBC: 0 % (ref 0.0–0.2)

## 2023-03-19 LAB — HEMOGLOBIN A1C
Hgb A1c MFr Bld: 6.4 % — ABNORMAL HIGH (ref 4.8–5.6)
Mean Plasma Glucose: 136.98 mg/dL

## 2023-03-19 LAB — PROTIME-INR
INR: 1.2 (ref 0.8–1.2)
Prothrombin Time: 15.4 seconds — ABNORMAL HIGH (ref 11.4–15.2)

## 2023-03-19 LAB — URINALYSIS, W/ REFLEX TO CULTURE (INFECTION SUSPECTED)
Bilirubin Urine: NEGATIVE
Glucose, UA: NEGATIVE mg/dL
Hgb urine dipstick: NEGATIVE
Ketones, ur: NEGATIVE mg/dL
Nitrite: NEGATIVE
Protein, ur: NEGATIVE mg/dL
Specific Gravity, Urine: 1.015 (ref 1.005–1.030)
pH: 5 (ref 5.0–8.0)

## 2023-03-19 LAB — SARS CORONAVIRUS 2 BY RT PCR: SARS Coronavirus 2 by RT PCR: NEGATIVE

## 2023-03-19 LAB — APTT: aPTT: 34 seconds (ref 24–36)

## 2023-03-19 LAB — GLUCOSE, CAPILLARY
Glucose-Capillary: 80 mg/dL (ref 70–99)
Glucose-Capillary: 132 mg/dL — ABNORMAL HIGH (ref 70–99)

## 2023-03-19 LAB — TSH: TSH: 0.249 u[IU]/mL — ABNORMAL LOW (ref 0.350–4.500)

## 2023-03-19 LAB — MAGNESIUM: Magnesium: 1.5 mg/dL — ABNORMAL LOW (ref 1.7–2.4)

## 2023-03-19 LAB — PHOSPHORUS: Phosphorus: 2.9 mg/dL (ref 2.5–4.6)

## 2023-03-19 LAB — VITAMIN B12: Vitamin B-12: 270 pg/mL (ref 180–914)

## 2023-03-19 LAB — LACTIC ACID, PLASMA
Lactic Acid, Venous: 1.6 mmol/L (ref 0.5–1.9)
Lactic Acid, Venous: 1.3 mmol/L (ref 0.5–1.9)

## 2023-03-19 MED ORDER — SODIUM CHLORIDE 0.9 % IV BOLUS
1000.0000 mL | Freq: Once | INTRAVENOUS | Status: AC
  Administered 2023-03-19: 1000 mL via INTRAVENOUS

## 2023-03-19 MED ORDER — ACETAMINOPHEN 650 MG RE SUPP
650.0000 mg | Freq: Four times a day (QID) | RECTAL | Status: DC | PRN
  Administered 2023-03-24: 650 mg via RECTAL
  Filled 2023-03-19: qty 1

## 2023-03-19 MED ORDER — SODIUM CHLORIDE 0.9 % IV SOLN: INTRAVENOUS | Status: DC

## 2023-03-19 MED ORDER — LEVOTHYROXINE SODIUM 25 MCG PO TABS
125.0000 ug | ORAL_TABLET | Freq: Every day | ORAL | Status: DC
  Administered 2023-03-20 – 2023-03-28 (×8): 125 ug via ORAL
  Filled 2023-03-19 (×9): qty 1

## 2023-03-19 MED ORDER — ANASTROZOLE 1 MG PO TABS
1.0000 mg | ORAL_TABLET | Freq: Every day | ORAL | Status: DC
  Administered 2023-03-20 – 2023-03-28 (×9): 1 mg via ORAL
  Filled 2023-03-19 (×11): qty 1

## 2023-03-19 MED ORDER — INSULIN ASPART 100 UNIT/ML IJ SOLN
0.0000 [IU] | Freq: Three times a day (TID) | INTRAMUSCULAR | Status: DC
  Administered 2023-03-21: 2 [IU] via SUBCUTANEOUS
  Administered 2023-03-21 – 2023-03-22 (×3): 1 [IU] via SUBCUTANEOUS
  Administered 2023-03-22: 2 [IU] via SUBCUTANEOUS
  Administered 2023-03-23 – 2023-03-24 (×3): 1 [IU] via SUBCUTANEOUS
  Administered 2023-03-24: 2 [IU] via SUBCUTANEOUS
  Administered 2023-03-24 – 2023-03-26 (×4): 1 [IU] via SUBCUTANEOUS
  Administered 2023-03-27: 2 [IU] via SUBCUTANEOUS
  Administered 2023-03-27: 1 [IU] via SUBCUTANEOUS
  Administered 2023-03-27 – 2023-03-28 (×2): 2 [IU] via SUBCUTANEOUS

## 2023-03-19 MED ORDER — PANTOPRAZOLE SODIUM 40 MG PO TBEC
40.0000 mg | DELAYED_RELEASE_TABLET | Freq: Every day | ORAL | Status: DC
  Administered 2023-03-19 – 2023-03-28 (×10): 40 mg via ORAL
  Filled 2023-03-19 (×10): qty 1

## 2023-03-19 MED ORDER — IOHEXOL 300 MG/ML  SOLN
80.0000 mL | Freq: Once | INTRAMUSCULAR | Status: AC | PRN
  Administered 2023-03-19: 80 mL via INTRAVENOUS

## 2023-03-19 MED ORDER — VENLAFAXINE HCL ER 75 MG PO CP24
75.0000 mg | ORAL_CAPSULE | Freq: Every day | ORAL | Status: DC
  Administered 2023-03-19 – 2023-03-27 (×9): 75 mg via ORAL
  Filled 2023-03-19 (×9): qty 1

## 2023-03-19 MED ORDER — ACETAMINOPHEN 325 MG PO TABS
650.0000 mg | ORAL_TABLET | Freq: Four times a day (QID) | ORAL | Status: DC | PRN
  Administered 2023-03-19 – 2023-03-28 (×15): 650 mg via ORAL
  Filled 2023-03-19 (×14): qty 2

## 2023-03-19 MED ORDER — ONDANSETRON HCL 4 MG PO TABS: 4.0000 mg | ORAL_TABLET | Freq: Four times a day (QID) | ORAL | Status: DC | PRN

## 2023-03-19 MED ORDER — INSULIN GLARGINE-YFGN 100 UNIT/ML ~~LOC~~ SOLN
5.0000 [IU] | Freq: Every day | SUBCUTANEOUS | Status: DC
  Administered 2023-03-19 – 2023-03-27 (×9): 5 [IU] via SUBCUTANEOUS
  Filled 2023-03-19 (×11): qty 0.05

## 2023-03-19 MED ORDER — MAGNESIUM SULFATE IN D5W 1-5 GM/100ML-% IV SOLN
1.0000 g | Freq: Once | INTRAVENOUS | Status: AC
  Administered 2023-03-19: 1 g via INTRAVENOUS
  Filled 2023-03-19: qty 100

## 2023-03-19 MED ORDER — MIRABEGRON ER 25 MG PO TB24
25.0000 mg | ORAL_TABLET | Freq: Every evening | ORAL | Status: DC
  Administered 2023-03-19 – 2023-03-27 (×9): 25 mg via ORAL
  Filled 2023-03-19 (×9): qty 1

## 2023-03-19 MED ORDER — ENSURE ENLIVE PO LIQD
237.0000 mL | Freq: Two times a day (BID) | ORAL | Status: DC
  Administered 2023-03-20 – 2023-03-25 (×11): 237 mL via ORAL

## 2023-03-19 MED ORDER — FAMOTIDINE 20 MG PO TABS
20.0000 mg | ORAL_TABLET | Freq: Every day | ORAL | Status: DC
  Administered 2023-03-19 – 2023-03-27 (×9): 20 mg via ORAL
  Filled 2023-03-19 (×9): qty 1

## 2023-03-19 MED ORDER — GABAPENTIN 100 MG PO CAPS
100.0000 mg | ORAL_CAPSULE | Freq: Three times a day (TID) | ORAL | Status: DC
  Administered 2023-03-19 – 2023-03-28 (×27): 100 mg via ORAL
  Filled 2023-03-19 (×27): qty 1

## 2023-03-19 MED ORDER — INSULIN ASPART 100 UNIT/ML IJ SOLN: 0.0000 [IU] | Freq: Every day | INTRAMUSCULAR | Status: DC

## 2023-03-19 MED ORDER — ONDANSETRON HCL 4 MG/2ML IJ SOLN: 4.0000 mg | Freq: Four times a day (QID) | INTRAMUSCULAR | Status: DC | PRN

## 2023-03-19 MED ORDER — HEPARIN SODIUM (PORCINE) 5000 UNIT/ML IJ SOLN
5000.0000 [IU] | Freq: Three times a day (TID) | INTRAMUSCULAR | Status: DC
  Administered 2023-03-19 – 2023-03-28 (×26): 5000 [IU] via SUBCUTANEOUS
  Filled 2023-03-19 (×28): qty 1

## 2023-03-19 MED ORDER — METOPROLOL SUCCINATE ER 50 MG PO TB24
100.0000 mg | ORAL_TABLET | Freq: Every day | ORAL | Status: DC
  Administered 2023-03-19 – 2023-03-28 (×10): 100 mg via ORAL
  Filled 2023-03-19 (×10): qty 2

## 2023-03-19 NOTE — Assessment & Plan Note (Signed)
-  Stable LFTs -Continue outpatient follow-up with gastroenterology service.

## 2023-03-19 NOTE — H&P (Signed)
History and Physical    Patient: Kimberly [name]:[id number] DOB: February 19, 1951 DOA: 03/19/2023 DOS: the patient was seen and examined on 03/19/2023 PCP: Tommie Sams, DO  Patient coming from: Home  Chief Complaint:  Chief Complaint  Patient presents with   Altered Mental Status   low grade fever   HPI: Kimberly [name] is a 72 y.o. female with medical history significant of type 2 diabetes with nephropathy, depression, history of breast cancer (status post bilateral mastectomy), gastroesophageal flux disease, class I obesity, and hypothyroidism; who presented to the hospital secondary to temperature and altered mental status.  Symptoms have been present approximately since Friday (03/16/23) and worsening. Patient with recent UTI treated with Bactrim as an outpatient; however after finishing antibiotic has developed nausea vomiting and decreased oral intake.  Patient's daughter noticed some associated weight loss and signs of dehydration.  Patient expressed continue taking her meds despite no eating and drinking well. Has not been any abdominal pain, hematuria, melena, hematochezia, focal weaknesses, chest pain, shortness of breath, coughing spells or sick contacts. Daughter reported son fluctuating low-grade temperature for what the mom has been using Tylenol at home.  In the ED no fever appreciated; patient found to be dehydrated with acute kidney injury on chronic renal failure and hyponatremia (sodium 123); PCR negative no acute intracranial normalities on CT head; chest x-ray also negative for acute process.  Patient with normal WBCs.  Urinalysis not demonstrating acute infection.    TRH has been consulted to place patient in the hospital for further evaluation and management status.  To provide treatment for hyponatremia and acute kidney injury.   Review of Systems: As mentioned in the history of present illness. All other systems reviewed and are negative.  Past Medical History:   Diagnosis Date   Anxiety    Arthritis    Depression    Diabetes mellitus    Family history of breast cancer    Family history of colon cancer    Family history of prostate cancer    Family history of skin cancer    GERD (gastroesophageal reflux disease)    High blood pressure    Hypothyroid    Obesity    PONV (postoperative nausea and vomiting)    Past Surgical History:  Procedure Laterality Date   ABDOMINAL HYSTERECTOMY     Bunions Right    CARPAL TUNNEL RELEASE Right    KNEE ARTHROSCOPY Right    MASTECTOMY MODIFIED RADICAL Right 07/29/2021   invasive ductal ca   THYROID SURGERY     removal   TOTAL KNEE ARTHROPLASTY Right 08/25/2013   Procedure: TOTAL KNEE ARTHROPLASTY;  Surgeon: Vickki Hearing, MD;  Location: [facility];  Service: Orthopedics;  Laterality: Right;   TOTAL KNEE ARTHROPLASTY Left 09/09/2014   Procedure: LEFT TOTAL KNEE ARTHROPLASTY;  Surgeon: Vickki Hearing, MD;  Location: [facility];  Service: Orthopedics;  Laterality: Left;   Social History:  reports that she has never smoked. She has never used smokeless tobacco. She reports that she does not drink alcohol and does not use drugs.  Allergies  Allergen Reactions   Ace Inhibitors Cough    Family History  Problem Relation Age of Onset   Diabetes Mother    Hypertension Mother    Congestive Heart Failure Father    Hypertension Father    Skin cancer Father 7       squamous cell on lip; metastatic   Hypertension Brother    Skin cancer Brother  squamous cell   Breast cancer Paternal Aunt        dx 52s d. 75s   Prostate cancer Paternal Uncle        dx 45s   Colon cancer Maternal Grandmother        dx 57s d. 64s   Diabetes Other    Cancer Cousin        unk type; possibly pancreatic    Prior to Admission medications   Medication Sig Start Date End Date Taking? Authorizing Provider  anastrozole (ARIMIDEX) 1 MG tablet TAKE 1 TABLET DAILY 07/25/22   Doreatha Massed, MD  Continuous Blood  Gluc Sensor (DEXCOM G7 SENSOR) MISC by Does not apply route.    [provider]  diclofenac Sodium (VOLTAREN) 1 % GEL Apply 2 g topically daily as needed (knee pain).    [provider]  famotidine (PEPCID) 40 MG tablet TAKE 1 TABLET DAILY 07/19/22   Everlene Other G, DO  gabapentin (NEURONTIN) 100 MG capsule Take 1 capsule (100 mg total) by mouth 3 (three) times daily. 11/25/22   Tommie Sams, DO  glipiZIDE (GLUCOTROL XL) 5 MG 24 hr tablet Take 1 tablet (5 mg total) by mouth daily with breakfast. 12/25/22   Dani Gobble, NP  hydrochlorothiazide (HYDRODIURIL) 25 MG tablet TAKE 1 TABLET DAILY 03/13/23   Everlene Other G, DO  insulin degludec (TRESIBA FLEXTOUCH) 100 UNIT/ML FlexTouch Pen Inject 28 Units into the skin at bedtime. 12/25/22   Dani Gobble, NP  Insulin Pen Needle (B-D ULTRAFINE III SHORT PEN) 31G X 8 MM MISC 1 each by Does not apply route as directed. Use to inject insulin once daily 12/25/22   Dani Gobble, NP  levothyroxine (SYNTHROID) 125 MCG tablet Take 1 tablet (125 mcg total) by mouth daily before breakfast. 12/25/22   Dani Gobble, NP  losartan (COZAAR) 100 MG tablet TAKE 1 TABLET EVERY MORNING 07/14/22   Tommie Sams, DO  Magnesium 250 MG TABS Take 250 mg by mouth daily.    [provider]  metFORMIN (GLUCOPHAGE-XR) 500 MG 24 hr tablet Take 1 tablet (500 mg total) by mouth 2 (two) times daily with a meal. 12/25/22   Dani Gobble, NP  metoprolol succinate (TOPROL-XL) 100 MG 24 hr tablet TAKE 1 TABLET AT BEDTIME   WITH OR IMMEDIATELY        FOLLOWING A MEAL 03/13/23   Cook, [name], DO  miconazole (MICOTIN) 2 % powder Apply topically as needed for itching. 01/29/23   Tommie Sams, DO  mirabegron ER (MYRBETRIQ) 25 MG TB24 tablet Take 1 tablet (25 mg total) by mouth daily. 02/14/23   McKenzie, Mardene Celeste, MD  nitrofurantoin, macrocrystal-monohydrate, (MACROBID) 100 MG capsule Take 1 capsule (100 mg total) by mouth 2 (two) times daily. 02/19/23    McKenzie, Mardene Celeste, MD  omeprazole (PRILOSEC) 20 MG capsule Take 1 capsule (20 mg total) by mouth at bedtime. 01/29/23   Tommie Sams, DO  tolterodine (DETROL LA) 4 MG 24 hr capsule Take 1 capsule (4 mg total) by mouth daily. 02/13/22   McKenzie, Mardene Celeste, MD  ULTICARE MINI PEN NEEDLES 31G X 6 MM MISC USE ONCE DAILY AS DIRECTED. 10/25/22   Roma Kayser, MD  venlafaxine XR (EFFEXOR-XR) 75 MG 24 hr capsule TAKE 1 CAPSULE BY MOUTH ONCE DAILY WITH BREAKFAST 10/17/22   Tommie Sams, [place]    Physical Exam: Vitals:   03/19/23 1001 03/19/23 1230 03/19/23 1247 03/19/23 1317  BP:  (!) 127/59  (!) 107/93  Pulse:  83 84   Resp: 18 (!) 23 19   Temp:      TempSrc:      SpO2:  96% 97%   Weight:      Height:       General exam: Alert, awake, oriented x 2; answering questions appropriately and just missing the year.  No acute distress. Respiratory system: Clear to auscultation. Respiratory effort normal.  Good saturation on room air. Cardiovascular system:RRR. No rubs or gallops; no JVD. Gastrointestinal system: Abdomen is obese, nondistended, soft and nontender. No organomegaly or masses felt. Normal bowel sounds heard. Central nervous system: Alert and oriented. No focal neurological deficits. Extremities: No cyanosis or clubbing. Skin: No petechiae. Psychiatry: Judgement and insight appear mildly impaired due to acute encephalopathy. Mood & affect appropriate.    Data Reviewed: Complete metabolic panel: Sodium 123, potassium 4.6, chloride 92, bicarb 22, BUN 35, creatinine 1.73, normal LFTs and GFR 31. CBC: White blood cell 7.7, hemoglobin 12.1 and platelet count 214 K Phosphorus: 2.9 Magnesium: 1.5  Assessment and Plan: * Acute metabolic encephalopathy -Secondary to dehydration and hyponatremia -Patient with recent UTI as an outpatient adequately treated and currently not complaining of dysuria. -Provide fluid resuscitation -Follow electrolytes trend -Consult reorientation -Minimize  sedative agents -Checking TSH and B12 level -CT head without acute intracranial abnormalities.  Class 1 obesity -Body mass index is 31.51 kg/m. -Low-calorie diet and portion control discussed with patient.  Hyponatremia -In the setting of decreased oral intake and continued use of diuretics -Provide fluid resuscitation, follow electrolytes trend. -Holding HCTZ at the moment.  Mixed hyperlipidemia -continue to follow lipid panel as an outpatient -continue low fat/heart healthy diet  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) -Update A1c -Hold oral hypoglycemic agents while inpatient -Will use sliding scale insulin and the use of Semglee.  Breast cancer of upper-outer quadrant of right female breast (HCC) -Status post bilateral mastectomy -Continue outpatient follow-up with oncology service Continue the use of Arimidex.  Nonalcoholic steatohepatitis (NASH) -Stable LFTs -Continue outpatient follow-up with gastroenterology service.  Depression -No suicidal ideation or hallucinations -Continue home antihypertensive agents.  Hypothyroidism -Update TSH -Continue Synthroid.  Essential hypertension, benign -Resewn home antihypertensive agents except for hydrochlorothiazide and losartan in the setting of acute dehydration/acute kidney injury -Heart healthy diet discussed with patient.  Hypomagnesemia: -In the setting of decreased oral intake and GI losses while using diuretics. -Replete electrolytes and follow trend.    Advance Care Planning:   Code Status: Full Code   Consults: none   Family Communication: daughter at bedside.  Severity of Illness: The appropriate patient status for this patient is OBSERVATION. Observation status is judged to be reasonable and necessary in order to provide the required intensity of service to ensure the patient's safety. The patient's presenting symptoms, physical exam findings, and initial radiographic and laboratory data in the  context of their medical condition is felt to place them at decreased risk for further clinical deterioration. Furthermore, it is anticipated that the patient will be medically stable for discharge from the hospital within 2 midnights of admission.   Author: Vassie Loll, MD 03/19/2023 2:57 PM  For on call review [email or web address].

## 2023-03-19 NOTE — Telephone Encounter (Signed)
Return vm, stating patient was having fever and was disoriented. Return call, patient/daughter voiced that patient is in the ED.

## 2023-03-19 NOTE — Assessment & Plan Note (Signed)
-  Status post bilateral mastectomy -Continue outpatient follow-up with oncology service Continue the use of Arimidex.

## 2023-03-19 NOTE — ED Notes (Signed)
ED TO INPATIENT HANDOFF REPORT  ED Nurse Name and Phone #: Bonita Quin Bentlee [place]  S Name/Age/Gender Kimberly [name] 72 y.o. female Room/Bed: APA10/APA10  Code Status   Code Status: Full Code  Home/SNF/Other Home Patient oriented to: self and place Is this baseline? No   Triage Complete: Triage complete  Chief Complaint Acute metabolic encephalopathy [G93.41]  Triage Note Pt's daughter states that pt was treated for a UTI around July 12th. States pt has been controlling a low grade fever at home with tylenol, states she has been getting disoriented which is usually a sign of a UTI.    Allergies Allergies  Allergen Reactions   Ace Inhibitors Cough    Level of Care/Admitting Diagnosis ED Disposition     ED Disposition  Admit   Condition  --   Comment  Hospital Area: [facility] [[id number]]  Level of Care: Med-Surg [16]  Covid Evaluation: Confirmed COVID Negative  Diagnosis: Acute metabolic encephalopathy [8295621]  Admitting Physician: Vassie Loll [[id number]]  Attending Physician: Vassie Loll [[id number]]          B Medical/Surgery History Past Medical History:  Diagnosis Date   Anxiety    Arthritis    Depression    Diabetes mellitus    Family history of breast cancer    Family history of colon cancer    Family history of prostate cancer    Family history of skin cancer    GERD (gastroesophageal reflux disease)    High blood pressure    Hypothyroid    Obesity    PONV (postoperative nausea and vomiting)    Past Surgical History:  Procedure Laterality Date   ABDOMINAL HYSTERECTOMY     Bunions Right    CARPAL TUNNEL RELEASE Right    KNEE ARTHROSCOPY Right    MASTECTOMY MODIFIED RADICAL Right 07/29/2021   invasive ductal ca   THYROID SURGERY     removal   TOTAL KNEE ARTHROPLASTY Right 08/25/2013   Procedure: TOTAL KNEE ARTHROPLASTY;  Surgeon: Vickki Hearing, MD;  Location: [facility];  Service: Orthopedics;  Laterality: Right;   TOTAL KNEE  ARTHROPLASTY Left 09/09/2014   Procedure: LEFT TOTAL KNEE ARTHROPLASTY;  Surgeon: Vickki Hearing, MD;  Location: [facility];  Service: Orthopedics;  Laterality: Left;     A IV Location/Drains/Wounds Patient Lines/Drains/Airways Status     Active Line/Drains/Airways     Name Placement date Placement time Site Days   Peripheral IV 03/19/23 20 G 1" Left Antecubital 03/19/23  1039  Antecubital  less than 1   Open Drain 1 Inferior;Right Breast 10 Fr. 07/29/21  1001  Breast  598            Intake/Output Last 24 hours No intake or output data in the 24 hours ending 03/19/23 1525  Labs/Imaging Results for orders placed or performed during the hospital encounter of 03/19/23 (from the past 48 hour(s))  SARS Coronavirus 2 by RT PCR (hospital order, performed in [facility] hospital lab) *cepheid single result test* Anterior Nasal Swab     Status: None   Collection Time: 03/19/23 10:33 AM   Specimen: Anterior Nasal Swab  Result Value Ref Range   SARS Coronavirus 2 by RT PCR NEGATIVE NEGATIVE    Comment: (NOTE) SARS-CoV-2 target nucleic acids are NOT DETECTED.  The SARS-CoV-2 RNA is generally detectable in upper and lower respiratory specimens during the acute phase of infection. The lowest concentration of SARS-CoV-2 viral copies this assay can detect is 250 copies / mL. A  negative result does not preclude SARS-CoV-2 infection and should not be used as the sole basis for treatment or other patient management decisions.  A negative result may occur with improper specimen collection / handling, submission of specimen other than nasopharyngeal swab, presence of viral mutation(s) within the areas targeted by this assay, and inadequate number of viral copies (<250 copies / mL). A negative result must be combined with clinical observations, patient history, and epidemiological information.  Fact Sheet for Patients:   RoadLapTop.co.za  Fact Sheet for Healthcare  Providers: http://kim-miller.com/  This test is not yet approved or  cleared by the [place] FDA and has been authorized for detection and/or diagnosis of SARS-CoV-2 by FDA under an Emergency Use Authorization (EUA).  This EUA will remain in effect (meaning this test can be used) for the duration of the COVID-19 declaration under Section 564(b)(1) of the Act, 21 U.S.C. section 360bbb-3(b)(1), unless the authorization is terminated or revoked sooner.  Performed at [facility], [address] [place], [place] [address]   Lactic acid, plasma     Status: None   Collection Time: 03/19/23 10:36 AM  Result Value Ref Range   Lactic Acid, Venous 1.6 0.5 - 1.9 mmol/L    Comment: Performed at [facility], [address], [place], [place] [address]  Comprehensive metabolic panel     Status: Abnormal   Collection Time: 03/19/23 10:36 AM  Result Value Ref Range   Sodium 123 (L) 135 - 145 mmol/L   Potassium 4.6 3.5 - 5.1 mmol/L   Chloride 92 (L) 98 - 111 mmol/L   CO2 22 22 - 32 mmol/L   Glucose, Bld 111 (H) 70 - 99 mg/dL    Comment: Glucose reference range applies only to samples taken after fasting for at least 8 hours.   BUN 35 (H) 8 - 23 mg/dL   Creatinine, Ser 0.98 (H) 0.44 - 1.00 mg/dL   Calcium 9.5 8.9 - 11.9 mg/dL   Total Protein 8.0 6.5 - 8.1 g/dL   Albumin 3.6 3.5 - 5.0 g/dL   AST 27 15 - 41 U/L   ALT 23 0 - 44 U/L   Alkaline Phosphatase 86 38 - 126 U/L   Total Bilirubin 0.8 0.3 - 1.2 mg/dL   GFR, Estimated 31 (L) >60 mL/min    Comment: (NOTE) Calculated using the CKD-EPI Creatinine Equation (2021)    Anion gap 9 5 - 15    Comment: Performed at [facility], [address], [place], [place] [address]  CBC with Differential     Status: Abnormal   Collection Time: 03/19/23 10:36 AM  Result Value Ref Range   WBC 7.7 4.0 - 10.5 K/uL   RBC 3.78 (L) 3.87 - 5.11 MIL/uL   Hemoglobin 12.1 12.0 - 15.0 g/dL   HCT 95.6 (L) 21.3 - 08.6 %   MCV 92.3 80.0 - 100.0  fL   MCH 32.0 26.0 - 34.0 pg   MCHC 34.7 30.0 - 36.0 g/dL   RDW 57.8 46.9 - 62.9 %   Platelets 214 150 - 400 K/uL   nRBC 0.0 0.0 - 0.2 %   Neutrophils Relative % 54 %   Neutro Abs 4.1 1.7 - 7.7 K/uL   Lymphocytes Relative 31 %   Lymphs Abs 2.4 0.7 - 4.0 K/uL   Monocytes Relative 11 %   Monocytes Absolute 0.9 0.1 - 1.0 K/uL   Eosinophils Relative 3 %   Eosinophils Absolute 0.3 0.0 - 0.5 K/uL   Basophils  Relative 1 %   Basophils Absolute 0.1 0.0 - 0.1 K/uL   Immature Granulocytes 0 %   Abs Immature Granulocytes 0.02 0.00 - 0.07 K/uL    Comment: Performed at [facility], [address], [place], [place] [address]  Phosphorus     Status: None   Collection Time: 03/19/23 10:36 AM  Result Value Ref Range   Phosphorus 2.9 2.5 - 4.6 mg/dL    Comment: Performed at [facility], [address] [place], [place] [address]  Magnesium     Status: Abnormal   Collection Time: 03/19/23 10:36 AM  Result Value Ref Range   Magnesium 1.5 (L) 1.7 - 2.4 mg/dL    Comment: Performed at [facility], [address], [place], [place] [address]  Urinalysis, w/ Reflex to Culture (Infection Suspected) -Urine, Clean Catch     Status: Abnormal   Collection Time: 03/19/23 11:53 AM  Result Value Ref Range   Specimen Source URINE, CLEAN CATCH    Color, Urine YELLOW YELLOW   APPearance CLEAR CLEAR   Specific Gravity, Urine 1.015 1.005 - 1.030   pH 5.0 5.0 - 8.0   Glucose, UA NEGATIVE NEGATIVE mg/dL   Hgb urine dipstick NEGATIVE NEGATIVE   Bilirubin Urine NEGATIVE NEGATIVE   Ketones, ur NEGATIVE NEGATIVE mg/dL   Protein, ur NEGATIVE NEGATIVE mg/dL   Nitrite NEGATIVE NEGATIVE   Leukocytes,Ua SMALL (A) NEGATIVE   RBC / HPF 0-5 0 - 5 RBC/hpf   WBC, UA 6-10 0 - 5 WBC/hpf    Comment:        Reflex urine culture not performed if WBC <=10, OR if Squamous epithelial cells >5. If Squamous epithelial cells >5 suggest recollection.    Bacteria, UA RARE (A) NONE SEEN   Squamous Epithelial / HPF 0-5 0 - 5 /HPF     Comment: Performed at [facility], [address], [place], [place] [address]  Lactic acid, plasma     Status: None   Collection Time: 03/19/23 12:16 PM  Result Value Ref Range   Lactic Acid, Venous 1.3 0.5 - 1.9 mmol/L    Comment: Performed at [facility], [address] [place], [place] [address]  Protime-INR     Status: Abnormal   Collection Time: 03/19/23 12:16 PM  Result Value Ref Range   Prothrombin Time 15.4 (H) 11.4 - 15.2 seconds   INR 1.2 0.8 - 1.2    Comment: (NOTE) INR goal varies based on device and disease states. Performed at [facility], [address], [place], [place] [address]   APTT     Status: None   Collection Time: 03/19/23 12:16 PM  Result Value Ref Range   aPTT 34 24 - 36 seconds    Comment: Performed at [facility], [address] [place], [place] [address]   CT ABDOMEN PELVIS W CONTRAST  Result Date: 03/19/2023 CLINICAL DATA:  Abdominal pain, acute, nonlocalized. EXAM: CT ABDOMEN AND PELVIS WITH CONTRAST TECHNIQUE: Multidetector CT imaging of the abdomen and pelvis was performed using the standard protocol following bolus administration of intravenous contrast. RADIATION DOSE REDUCTION: This exam was performed according to the departmental dose-optimization program which includes automated exposure control, adjustment of the mA and/or kV according to patient size and/or use of iterative reconstruction technique. CONTRAST:  80mL OMNIPAQUE IOHEXOL 300 MG/ML  SOLN COMPARISON:  01/15/2023 FINDINGS: Lower chest: Motion artifact at the lung bases without gross abnormality. Hepatobiliary: Significant motion artifact without gross abnormality to the liver or gallbladder. Question a small gallstone on  the delayed set of images. Pancreas: Unremarkable. No pancreatic ductal dilatation or surrounding inflammatory changes. Spleen: Spleen has a heterogeneous appearance on the initial set of images but appears to be within normal limits on the delayed images. Spleen is slightly  prominent for size and similar to the previous examination. Adrenals/Urinary Tract: Normal appearance of the adrenal glands. Normal appearance of the urinary bladder. Kidneys are best seen on the delayed images due to significant motion artifact on the portal venous phase. Again noted is a cyst in the left kidney lower pole. No hydronephrosis. Mild perinephric stranding is similar to the previous examination. Stomach/Bowel: Dilatation of the distal esophagus containing gas. No gross abnormality to the stomach. No evidence for bowel obstruction. Moderate amount of stool in the colon. Normal appendix. No evidence for acute bowel inflammation. Vascular/Lymphatic: Normal caliber of the abdominal aorta. Again noted is a calcified structure at the splenic hilum which is suggestive for a small aneurysm measuring approximately 1.0 cm. Mildly prominent lymph nodes in the gastrohepatic ligament on image 21/1 measures 1.2 cm and similar to the comparison examination. Small lymph nodes in left iliac chain on image 51/1 are stable. Reproductive: Status post hysterectomy. No adnexal masses. Other: Negative for free fluid.  Negative for free air. Musculoskeletal: Again noted is multilevel degenerative disc and endplate disease in lumbar spine. No acute bone abnormality. IMPRESSION: 1. Study has limitations due to motion artifact. 2. No acute abnormality in the abdomen or pelvis. 3. Stable calcified splenic artery aneurysm measuring approximately 1.0 cm. 4. Question small gallstone. Electronically Signed   By: Richarda Overlie M.D.   On: 03/19/2023 14:12   CT Head Wo Contrast  Result Date: 03/19/2023 CLINICAL DATA:  Mental status change, unknown cause EXAM: CT HEAD WITHOUT CONTRAST TECHNIQUE: Contiguous axial images were obtained from the base of the skull through the vertex without intravenous contrast. RADIATION DOSE REDUCTION: This exam was performed according to the departmental dose-optimization program which includes automated  exposure control, adjustment of the mA and/or kV according to patient size and/or use of iterative reconstruction technique. COMPARISON:  CT 01/15/23 FINDINGS: Brain: No evidence of acute infarction, hemorrhage, hydrocephalus, extra-axial collection or mass lesion/mass effect. There are scattered predominantly subcortical hypodensities, favored to represent sequela of chronic microvascular ischemic change. Vascular: No hyperdense vessel or unexpected calcification. Skull: No fracture. Redemonstrated lucent lesion along the midline frontal calvarium. This most likely represents an osseous hemangioma. Sinuses/Orbits: No middle ear or mastoid effusion. Paranasal sinuses are clear. Orbits are unremarkable. Other: None. IMPRESSION: 1. No CT finding to explain altered mental status. 2. There are scattered predominantly subcortical hypodensities, favored to represent sequela of chronic microvascular ischemic change. Electronically Signed   By: Lorenza Cambridge M.D.   On: 03/19/2023 13:58   DG Chest Port 1 View  Result Date: 03/19/2023 CLINICAL DATA:  Questionable sepsis EXAM: PORTABLE CHEST 1 VIEW COMPARISON:  X-ray 01/16/2023 FINDINGS: Underinflation. No consolidation, pneumothorax or effusion. No edema. Normal cardiopericardial silhouette. Film is slightly rotated. Overlapping cardiac leads. Curvature of the spine. Surgical clips in the right axillary region. IMPRESSION: Underinflation.  No acute cardiopulmonary disease. Electronically Signed   By: Karen Kays M.D.   On: 03/19/2023 10:59    Pending Labs Unresulted Labs (From admission, onward)     Start     Ordered   03/20/23 0500  Basic metabolic panel  Tomorrow morning,   R        03/19/23 1452   03/20/23 0500  CBC  Tomorrow morning,  R        03/19/23 1452   03/19/23 1452  Hemoglobin A1c  Once,   R        03/19/23 1452   03/19/23 1452  Vitamin B12  Once,   R        03/19/23 1452   03/19/23 1451  TSH  Once,   R        03/19/23 1452   03/19/23 1006   Blood Culture (routine x 2)  (Undifferentiated presentation (screening labs and basic nursing orders))  BLOOD CULTURE X 2,   STAT      03/19/23 1005            Vitals/Pain Today's Vitals   03/19/23 1247 03/19/23 1317 03/19/23 1415 03/19/23 1445  BP:  (!) 107/93 (!) 118/57   Pulse: 84   78  Resp: 19   (!) 22  Temp:      TempSrc:      SpO2: 97%   93%  Weight:      Height:      PainSc:        Isolation Precautions No active isolations  Medications Medications  heparin injection 5,000 Units (has no administration in time range)  0.9 %  sodium chloride infusion (has no administration in time range)  acetaminophen (TYLENOL) tablet 650 mg (has no administration in time range)    Or  acetaminophen (TYLENOL) suppository 650 mg (has no administration in time range)  ondansetron (ZOFRAN) tablet 4 mg (has no administration in time range)    Or  ondansetron (ZOFRAN) injection 4 mg (has no administration in time range)  pantoprazole (PROTONIX) EC tablet 40 mg (has no administration in time range)  famotidine (PEPCID) tablet 20 mg (has no administration in time range)  anastrozole (ARIMIDEX) tablet 1 mg (has no administration in time range)  gabapentin (NEURONTIN) capsule 100 mg (has no administration in time range)  levothyroxine (SYNTHROID) tablet 125 mcg (has no administration in time range)  metoprolol succinate (TOPROL-XL) 24 hr tablet 100 mg (has no administration in time range)  mirabegron ER (MYRBETRIQ) tablet 25 mg (has no administration in time range)  venlafaxine XR (EFFEXOR-XR) 24 hr capsule 75 mg (has no administration in time range)  insulin glargine-yfgn (SEMGLEE) injection 5 Units (has no administration in time range)  insulin aspart (novoLOG) injection 0-9 Units (has no administration in time range)  insulin aspart (novoLOG) injection 0-5 Units (has no administration in time range)  iohexol (OMNIPAQUE) 300 MG/ML solution 80 mL (80 mLs Intravenous Contrast Given  03/19/23 1312)  sodium chloride 0.9 % bolus 1,000 mL (1,000 mLs Intravenous New Bag/Given 03/19/23 1422)    Mobility walks     Focused Assessments Neuro Assessment Handoff:  Swallow screen pass?  No need         Neuro Assessment:   Neuro Checks:      Has TPA been given? No If patient is a Neuro Trauma and patient is going to OR before floor call report to 4N Charge nurse: [phone] or [phone]   R Recommendations: See Admitting Provider Note  Report given to:   Additional Notes: Daughter at bedside who states pt is confuse at times. Pt able to answer nurses questions correctly.

## 2023-03-19 NOTE — Assessment & Plan Note (Signed)
-  In the setting of decreased oral intake and continued use of diuretics -Provide fluid resuscitation, follow electrolytes trend. -Holding HCTZ at the moment.

## 2023-03-19 NOTE — Assessment & Plan Note (Signed)
-  Secondary to dehydration and hyponatremia -Patient with recent UTI as an outpatient adequately treated and currently not complaining of dysuria. -Provide fluid resuscitation -Follow electrolytes trend -Consult reorientation -Minimize sedative agents -Checking TSH and B12 level -CT head without acute intracranial abnormalities.

## 2023-03-19 NOTE — Assessment & Plan Note (Signed)
-  No suicidal ideation or hallucinations -Continue home antihypertensive agents.

## 2023-03-19 NOTE — ED Provider Notes (Signed)
[place] EMERGENCY DEPARTMENT AT [facility] Provider Note   CSN: [id number] Arrival date & time: 03/19/23  4098     History  Chief Complaint  Patient presents with   Altered Mental Status   low grade fever   HPI  Kimberly [name] is a 72 y.o. female with diabetes, hypertension, GERD presenting for altered mental status and low-grade fever.  Altered mental status started on Friday.  Daughter reports that she was having difficulty forming her words at times seem confused about where she was.  Daughter also reports a recent UTI.  States she finished her Bactrim on Thursday.  Patient reports urinary frequency but no other urinary symptoms.  Also reports epigastric pain as well.  Reports some nausea and vomiting.  States she had a "low-grade fever" over the weekend.  She has been taking Tylenol around-the-clock.  Denies chest pain shortness of breath.   Altered Mental Status      Home Medications Prior to Admission medications   Medication Sig Start Date End Date Taking? Authorizing Provider  anastrozole (ARIMIDEX) 1 MG tablet TAKE 1 TABLET DAILY 07/25/22   Doreatha Massed, MD  Continuous Blood Gluc Sensor (DEXCOM G7 SENSOR) MISC by Does not apply route.    [provider]  diclofenac Sodium (VOLTAREN) 1 % GEL Apply 2 g topically daily as needed (knee pain).    [provider]  famotidine (PEPCID) 40 MG tablet TAKE 1 TABLET DAILY 07/19/22   Everlene Other G, DO  gabapentin (NEURONTIN) 100 MG capsule Take 1 capsule (100 mg total) by mouth 3 (three) times daily. 11/25/22   Tommie Sams, DO  glipiZIDE (GLUCOTROL XL) 5 MG 24 hr tablet Take 1 tablet (5 mg total) by mouth daily with breakfast. 12/25/22   Dani Gobble, NP  hydrochlorothiazide (HYDRODIURIL) 25 MG tablet TAKE 1 TABLET DAILY 03/13/23   Everlene Other G, DO  insulin degludec (TRESIBA FLEXTOUCH) 100 UNIT/ML FlexTouch Pen Inject 28 Units into the skin at bedtime. 12/25/22   Dani Gobble, NP   Insulin Pen Needle (B-D ULTRAFINE III SHORT PEN) 31G X 8 MM MISC 1 each by Does not apply route as directed. Use to inject insulin once daily 12/25/22   Dani Gobble, NP  levothyroxine (SYNTHROID) 125 MCG tablet Take 1 tablet (125 mcg total) by mouth daily before breakfast. 12/25/22   Dani Gobble, NP  losartan (COZAAR) 100 MG tablet TAKE 1 TABLET EVERY MORNING 07/14/22   Tommie Sams, DO  Magnesium 250 MG TABS Take 250 mg by mouth daily.    [provider]  metFORMIN (GLUCOPHAGE-XR) 500 MG 24 hr tablet Take 1 tablet (500 mg total) by mouth 2 (two) times daily with a meal. 12/25/22   Dani Gobble, NP  metoprolol succinate (TOPROL-XL) 100 MG 24 hr tablet TAKE 1 TABLET AT BEDTIME   WITH OR IMMEDIATELY        FOLLOWING A MEAL 03/13/23   Cook, [name], DO  miconazole (MICOTIN) 2 % powder Apply topically as needed for itching. 01/29/23   Tommie Sams, DO  mirabegron ER (MYRBETRIQ) 25 MG TB24 tablet Take 1 tablet (25 mg total) by mouth daily. 02/14/23   McKenzie, Mardene Celeste, MD  nitrofurantoin, macrocrystal-monohydrate, (MACROBID) 100 MG capsule Take 1 capsule (100 mg total) by mouth 2 (two) times daily. 02/19/23   McKenzie, Mardene Celeste, MD  omeprazole (PRILOSEC) 20 MG capsule Take 1 capsule (20 mg total) by mouth at bedtime. 01/29/23  Cook, [name], DO  tolterodine (DETROL LA) 4 MG 24 hr capsule Take 1 capsule (4 mg total) by mouth daily. 02/13/22   McKenzie, Mardene Celeste, MD  ULTICARE MINI PEN NEEDLES 31G X 6 MM MISC USE ONCE DAILY AS DIRECTED. 10/25/22   Roma Kayser, MD  venlafaxine XR (EFFEXOR-XR) 75 MG 24 hr capsule TAKE 1 CAPSULE BY MOUTH ONCE DAILY WITH BREAKFAST 10/17/22   Tommie Sams, DO      Allergies    Ace inhibitors    Review of Systems   See HPI for pertinent positives   Physical Exam   Vitals:   03/19/23 1415 03/19/23 1445  BP: (!) 118/57   Pulse:  78  Resp:  (!) 22  Temp:    SpO2:  93%    CONSTITUTIONAL:  well-appearing, NAD NEURO:  GCS 15. Speech  is goal oriented. No deficits appreciated to CN III-XII; symmetric eyebrow raise, no facial drooping, tongue midline. Patient has equal grip strength bilaterally with 5/5 strength against resistance in all major muscle groups bilaterally. Sensation to light touch intact. Patient moves extremities without ataxia. Normal finger-nose-finger. Patient ambulatory with steady gait.  EYES:  eyes equal and reactive ENT/NECK:  Supple, no stridor  CARDIO: regular rate and rhythm, appears well-perfused  PULM:  No respiratory distress, CTAB GI/GU:  non-distended, soft MSK/SPINE:  No gross deformities, no edema, moves all extremities  SKIN:  no rash, atraumatic   *Additional and/or pertinent findings included in MDM below    ED Results / Procedures / Treatments   Labs (all labs ordered are listed, but only abnormal results are displayed) Labs Reviewed  COMPREHENSIVE METABOLIC PANEL - Abnormal; Notable for the following components:      Result Value   Sodium 123 (*)    Chloride 92 (*)    Glucose, Bld 111 (*)    BUN 35 (*)    Creatinine, Ser 1.73 (*)    GFR, Estimated 31 (*)    All other components within normal limits  CBC WITH DIFFERENTIAL/PLATELET - Abnormal; Notable for the following components:   RBC 3.78 (*)    HCT 34.9 (*)    All other components within normal limits  URINALYSIS, W/ REFLEX TO CULTURE (INFECTION SUSPECTED) - Abnormal; Notable for the following components:   Leukocytes,Ua SMALL (*)    Bacteria, UA RARE (*)    All other components within normal limits  PROTIME-INR - Abnormal; Notable for the following components:   Prothrombin Time 15.4 (*)    All other components within normal limits  MAGNESIUM - Abnormal; Notable for the following components:   Magnesium 1.5 (*)    All other components within normal limits  SARS CORONAVIRUS 2 BY RT PCR  CULTURE, BLOOD (ROUTINE X 2)  CULTURE, BLOOD (ROUTINE X 2)  LACTIC ACID, PLASMA  LACTIC ACID, PLASMA  APTT  PHOSPHORUS  TSH   HEMOGLOBIN A1C  VITAMIN B12    EKG EKG Interpretation Date/Time:  Monday March 19 2023 09:56:42 EDT Ventricular Rate:  82 PR Interval:  152 QRS Duration:  149 QT Interval:  394 QTC Calculation: 461 R Axis:   -24  Text Interpretation: Sinus rhythm Right bundle branch block No significant change since last tracing Confirmed by Vanetta Mulders [phone]) on 03/19/2023 9:59:23 AM  Radiology CT ABDOMEN PELVIS W CONTRAST  Result Date: 03/19/2023 CLINICAL DATA:  Abdominal pain, acute, nonlocalized. EXAM: CT ABDOMEN AND PELVIS WITH CONTRAST TECHNIQUE: Multidetector CT imaging of the abdomen and pelvis was performed using the  standard protocol following bolus administration of intravenous contrast. RADIATION DOSE REDUCTION: This exam was performed according to the departmental dose-optimization program which includes automated exposure control, adjustment of the mA and/or kV according to patient size and/or use of iterative reconstruction technique. CONTRAST:  80mL OMNIPAQUE IOHEXOL 300 MG/ML  SOLN COMPARISON:  01/15/2023 FINDINGS: Lower chest: Motion artifact at the lung bases without gross abnormality. Hepatobiliary: Significant motion artifact without gross abnormality to the liver or gallbladder. Question a small gallstone on the delayed set of images. Pancreas: Unremarkable. No pancreatic ductal dilatation or surrounding inflammatory changes. Spleen: Spleen has a heterogeneous appearance on the initial set of images but appears to be within normal limits on the delayed images. Spleen is slightly prominent for size and similar to the previous examination. Adrenals/Urinary Tract: Normal appearance of the adrenal glands. Normal appearance of the urinary bladder. Kidneys are best seen on the delayed images due to significant motion artifact on the portal venous phase. Again noted is a cyst in the left kidney lower pole. No hydronephrosis. Mild perinephric stranding is similar to the previous examination.  Stomach/Bowel: Dilatation of the distal esophagus containing gas. No gross abnormality to the stomach. No evidence for bowel obstruction. Moderate amount of stool in the colon. Normal appendix. No evidence for acute bowel inflammation. Vascular/Lymphatic: Normal caliber of the abdominal aorta. Again noted is a calcified structure at the splenic hilum which is suggestive for a small aneurysm measuring approximately 1.0 cm. Mildly prominent lymph nodes in the gastrohepatic ligament on image 21/1 measures 1.2 cm and similar to the comparison examination. Small lymph nodes in left iliac chain on image 51/1 are stable. Reproductive: Status post hysterectomy. No adnexal masses. Other: Negative for free fluid.  Negative for free air. Musculoskeletal: Again noted is multilevel degenerative disc and endplate disease in lumbar spine. No acute bone abnormality. IMPRESSION: 1. Study has limitations due to motion artifact. 2. No acute abnormality in the abdomen or pelvis. 3. Stable calcified splenic artery aneurysm measuring approximately 1.0 cm. 4. Question small gallstone. Electronically Signed   By: Richarda Overlie M.D.   On: 03/19/2023 14:12   CT Head Wo Contrast  Result Date: 03/19/2023 CLINICAL DATA:  Mental status change, unknown cause EXAM: CT HEAD WITHOUT CONTRAST TECHNIQUE: Contiguous axial images were obtained from the base of the skull through the vertex without intravenous contrast. RADIATION DOSE REDUCTION: This exam was performed according to the departmental dose-optimization program which includes automated exposure control, adjustment of the mA and/or kV according to patient size and/or use of iterative reconstruction technique. COMPARISON:  CT 01/15/23 FINDINGS: Brain: No evidence of acute infarction, hemorrhage, hydrocephalus, extra-axial collection or mass lesion/mass effect. There are scattered predominantly subcortical hypodensities, favored to represent sequela of chronic microvascular ischemic change.  Vascular: No hyperdense vessel or unexpected calcification. Skull: No fracture. Redemonstrated lucent lesion along the midline frontal calvarium. This most likely represents an osseous hemangioma. Sinuses/Orbits: No middle ear or mastoid effusion. Paranasal sinuses are clear. Orbits are unremarkable. Other: None. IMPRESSION: 1. No CT finding to explain altered mental status. 2. There are scattered predominantly subcortical hypodensities, favored to represent sequela of chronic microvascular ischemic change. Electronically Signed   By: Lorenza Cambridge M.D.   On: 03/19/2023 13:58   DG Chest Port 1 View  Result Date: 03/19/2023 CLINICAL DATA:  Questionable sepsis EXAM: PORTABLE CHEST 1 VIEW COMPARISON:  X-ray 01/16/2023 FINDINGS: Underinflation. No consolidation, pneumothorax or effusion. No edema. Normal cardiopericardial silhouette. Film is slightly rotated. Overlapping cardiac leads. Curvature of the  spine. Surgical clips in the right axillary region. IMPRESSION: Underinflation.  No acute cardiopulmonary disease. Electronically Signed   By: Karen Kays M.D.   On: 03/19/2023 10:59    Procedures .Critical Care  Performed by: Gareth Eagle, PA-C Authorized by: Gareth Eagle, PA-C   Critical care provider statement:    Critical care time (minutes):  30   Critical care was necessary to treat or prevent imminent or life-threatening deterioration of the following conditions:  Renal failure and metabolic crisis (with hyponatremia)   Critical care was time spent personally by me on the following activities:  Development of treatment plan with patient or surrogate, discussions with consultants, evaluation of patient's response to treatment, examination of patient, ordering and review of laboratory studies, ordering and review of radiographic studies, ordering and performing treatments and interventions, pulse oximetry, re-evaluation of patient's condition and review of old charts     Medications Ordered  in ED Medications  heparin injection 5,000 Units (has no administration in time range)  0.9 %  sodium chloride infusion (has no administration in time range)  acetaminophen (TYLENOL) tablet 650 mg (has no administration in time range)    Or  acetaminophen (TYLENOL) suppository 650 mg (has no administration in time range)  ondansetron (ZOFRAN) tablet 4 mg (has no administration in time range)    Or  ondansetron (ZOFRAN) injection 4 mg (has no administration in time range)  pantoprazole (PROTONIX) EC tablet 40 mg (has no administration in time range)  famotidine (PEPCID) tablet 20 mg (has no administration in time range)  anastrozole (ARIMIDEX) tablet 1 mg (has no administration in time range)  gabapentin (NEURONTIN) capsule 100 mg (has no administration in time range)  levothyroxine (SYNTHROID) tablet 125 mcg (has no administration in time range)  metoprolol succinate (TOPROL-XL) 24 hr tablet 100 mg (has no administration in time range)  mirabegron ER (MYRBETRIQ) tablet 25 mg (has no administration in time range)  venlafaxine XR (EFFEXOR-XR) 24 hr capsule 75 mg (has no administration in time range)  insulin glargine-yfgn (SEMGLEE) injection 5 Units (has no administration in time range)  insulin aspart (novoLOG) injection 0-9 Units (has no administration in time range)  insulin aspart (novoLOG) injection 0-5 Units (has no administration in time range)  iohexol (OMNIPAQUE) 300 MG/ML solution 80 mL (80 mLs Intravenous Contrast Given 03/19/23 1312)  sodium chloride 0.9 % bolus 1,000 mL (1,000 mLs Intravenous New Bag/Given 03/19/23 1422)    ED Course/ Medical Decision Making/ A&P Clinical Course as of 03/19/23 1526  Mon Mar 19, 2023  1524 DG Chest [place] 1 View [JR]    Clinical Course User Index [JR] Gareth Eagle, PA-C                             Medical Decision Making Amount and/or Complexity of Data Reviewed Labs: ordered. Radiology: ordered. ECG/medicine tests:  ordered.  Risk Prescription drug management. Decision regarding hospitalization.   Initial Impression and Ddx 72 well appearing female presenting for altered mental status and low-grade fever.  Exam was unremarkable.  DDx includes sepsis, stroke, ICH, electrolyte derangement, UTI, intra-abdominal infection, other. Patient PMH that increases complexity of ED encounter:  recent UTI,  Interpretation of Diagnostics - I independent reviewed and interpreted the labs as followed: diabetes, hypertension, GERD  - I independently visualized the following imaging with scope of interpretation limited to determining acute life threatening conditions related to emergency care: CT head and abdomen/pelvis, which revealed  no acute abnormalities  -I personally reviewed and interpreted EKG which revealed sinus rhythm.  Patient Reassessment and Ultimate Disposition/Management Patient remained stable and in no acute distress.  Labs suggest AKI and hyponatremia.  This could be contributing to her altered mental status.  From my standpoint, patient has been neuro appropriate but daughter continued to express concerns about her level of mentation.  Admitted to hospital service with Dr. Gwenlyn Perking for altered mental status in setting of AKI and hyponatremia.  Overall workup does not suggest sepsis.  Patient management required discussion with the following services or consulting groups:  Hospitalist Service  Complexity of Problems Addressed Acute complicated illness or Injury  Additional Data Reviewed and Analyzed Further history obtained from: Further history from spouse/family member, Past medical history and medications listed in the EMR, and Prior ED visit notes  Patient Encounter Risk Assessment Consideration of hospitalization         Final Clinical Impression(s) / ED Diagnoses Final diagnoses:  AKI (acute kidney injury) (HCC)  Hyponatremia    Rx / DC Orders ED Discharge Orders     None          Gareth Eagle, PA-C 03/19/23 1526    Vanetta Mulders, MD 03/21/23 [phone]

## 2023-03-19 NOTE — Assessment & Plan Note (Signed)
-  continue to follow lipid panel as an outpatient -continue low fat/heart healthy diet

## 2023-03-19 NOTE — Assessment & Plan Note (Signed)
-  Update A1c -Hold oral hypoglycemic agents while inpatient -Will use sliding scale insulin and the use of Semglee.

## 2023-03-19 NOTE — Assessment & Plan Note (Signed)
-  Body mass index is 31.51 kg/m. -Low-calorie diet and portion control discussed with patient.

## 2023-03-19 NOTE — Assessment & Plan Note (Addendum)
Continue Synthroid °

## 2023-03-19 NOTE — Assessment & Plan Note (Signed)
-  Resewn home antihypertensive agents except for hydrochlorothiazide and losartan in the setting of acute dehydration/acute kidney injury -Heart healthy diet discussed with patient.

## 2023-03-19 NOTE — ED Triage Notes (Signed)
Pt's daughter states that pt was treated for a UTI around July 12th. States pt has been controlling a low grade fever at home with tylenol, states she has been getting disoriented which is usually a sign of a UTI.

## 2023-03-20 DIAGNOSIS — E860 Dehydration: Secondary | ICD-10-CM | POA: Diagnosis present

## 2023-03-20 DIAGNOSIS — N182 Chronic kidney disease, stage 2 (mild): Secondary | ICD-10-CM | POA: Diagnosis present

## 2023-03-20 DIAGNOSIS — R410 Disorientation, unspecified: Secondary | ICD-10-CM | POA: Diagnosis not present

## 2023-03-20 DIAGNOSIS — Z7984 Long term (current) use of oral hypoglycemic drugs: Secondary | ICD-10-CM | POA: Diagnosis not present

## 2023-03-20 DIAGNOSIS — K7581 Nonalcoholic steatohepatitis (NASH): Secondary | ICD-10-CM | POA: Diagnosis present

## 2023-03-20 DIAGNOSIS — F32A Depression, unspecified: Secondary | ICD-10-CM | POA: Diagnosis present

## 2023-03-20 DIAGNOSIS — I6782 Cerebral ischemia: Secondary | ICD-10-CM | POA: Diagnosis not present

## 2023-03-20 DIAGNOSIS — E782 Mixed hyperlipidemia: Secondary | ICD-10-CM | POA: Diagnosis present

## 2023-03-20 DIAGNOSIS — Z1152 Encounter for screening for COVID-19: Secondary | ICD-10-CM | POA: Diagnosis not present

## 2023-03-20 DIAGNOSIS — N1831 Chronic kidney disease, stage 3a: Secondary | ICD-10-CM | POA: Diagnosis not present

## 2023-03-20 DIAGNOSIS — G9341 Metabolic encephalopathy: Secondary | ICD-10-CM | POA: Diagnosis present

## 2023-03-20 DIAGNOSIS — Z6833 Body mass index (BMI) 33.0-33.9, adult: Secondary | ICD-10-CM | POA: Diagnosis not present

## 2023-03-20 DIAGNOSIS — F320 Major depressive disorder, single episode, mild: Secondary | ICD-10-CM | POA: Diagnosis not present

## 2023-03-20 DIAGNOSIS — C50411 Malignant neoplasm of upper-outer quadrant of right female breast: Secondary | ICD-10-CM | POA: Diagnosis present

## 2023-03-20 DIAGNOSIS — E871 Hypo-osmolality and hyponatremia: Secondary | ICD-10-CM | POA: Diagnosis present

## 2023-03-20 DIAGNOSIS — R509 Fever, unspecified: Secondary | ICD-10-CM | POA: Diagnosis present

## 2023-03-20 DIAGNOSIS — N179 Acute kidney failure, unspecified: Secondary | ICD-10-CM

## 2023-03-20 DIAGNOSIS — M1990 Unspecified osteoarthritis, unspecified site: Secondary | ICD-10-CM | POA: Diagnosis present

## 2023-03-20 DIAGNOSIS — E039 Hypothyroidism, unspecified: Secondary | ICD-10-CM | POA: Diagnosis present

## 2023-03-20 DIAGNOSIS — K219 Gastro-esophageal reflux disease without esophagitis: Secondary | ICD-10-CM | POA: Diagnosis present

## 2023-03-20 DIAGNOSIS — R4182 Altered mental status, unspecified: Secondary | ICD-10-CM | POA: Diagnosis not present

## 2023-03-20 DIAGNOSIS — A419 Sepsis, unspecified organism: Secondary | ICD-10-CM | POA: Diagnosis not present

## 2023-03-20 DIAGNOSIS — D61818 Other pancytopenia: Secondary | ICD-10-CM | POA: Diagnosis present

## 2023-03-20 DIAGNOSIS — E876 Hypokalemia: Secondary | ICD-10-CM | POA: Diagnosis present

## 2023-03-20 DIAGNOSIS — Z794 Long term (current) use of insulin: Secondary | ICD-10-CM | POA: Diagnosis not present

## 2023-03-20 DIAGNOSIS — E1122 Type 2 diabetes mellitus with diabetic chronic kidney disease: Secondary | ICD-10-CM | POA: Diagnosis present

## 2023-03-20 DIAGNOSIS — I129 Hypertensive chronic kidney disease with stage 1 through stage 4 chronic kidney disease, or unspecified chronic kidney disease: Secondary | ICD-10-CM | POA: Diagnosis present

## 2023-03-20 DIAGNOSIS — G0490 Encephalitis and encephalomyelitis, unspecified: Secondary | ICD-10-CM | POA: Diagnosis not present

## 2023-03-20 DIAGNOSIS — I10 Essential (primary) hypertension: Secondary | ICD-10-CM | POA: Diagnosis not present

## 2023-03-20 DIAGNOSIS — E669 Obesity, unspecified: Secondary | ICD-10-CM | POA: Diagnosis present

## 2023-03-20 LAB — GLUCOSE, CAPILLARY
Glucose-Capillary: 100 mg/dL — ABNORMAL HIGH (ref 70–99)
Glucose-Capillary: 115 mg/dL — ABNORMAL HIGH (ref 70–99)
Glucose-Capillary: 120 mg/dL — ABNORMAL HIGH (ref 70–99)
Glucose-Capillary: 134 mg/dL — ABNORMAL HIGH (ref 70–99)

## 2023-03-20 LAB — CBC
HCT: 34.7 % — ABNORMAL LOW (ref 36.0–46.0)
Hemoglobin: 11.3 g/dL — ABNORMAL LOW (ref 12.0–15.0)
MCH: 31.7 pg (ref 26.0–34.0)
MCHC: 32.6 g/dL (ref 30.0–36.0)
MCV: 97.2 fL (ref 80.0–100.0)
Platelets: 153 10*3/uL (ref 150–400)
RBC: 3.57 MIL/uL — ABNORMAL LOW (ref 3.87–5.11)
RDW: 13 % (ref 11.5–15.5)
WBC: 6.9 10*3/uL (ref 4.0–10.5)
nRBC: 0 % (ref 0.0–0.2)

## 2023-03-20 LAB — CULTURE, BLOOD (ROUTINE X 2): Special Requests: ADEQUATE

## 2023-03-20 LAB — BASIC METABOLIC PANEL
Anion gap: 10 (ref 5–15)
BUN: 29 mg/dL — ABNORMAL HIGH (ref 8–23)
CO2: 17 mmol/L — ABNORMAL LOW (ref 22–32)
Calcium: 8.8 mg/dL — ABNORMAL LOW (ref 8.9–10.3)
Chloride: 102 mmol/L (ref 98–111)
Creatinine, Ser: 1.22 mg/dL — ABNORMAL HIGH (ref 0.44–1.00)
GFR, Estimated: 47 mL/min — ABNORMAL LOW (ref >60)
Glucose, Bld: 79 mg/dL (ref 70–99)
Potassium: 3.9 mmol/L (ref 3.5–5.1)
Sodium: 129 mmol/L — ABNORMAL LOW (ref 135–145)

## 2023-03-20 LAB — MAGNESIUM: Magnesium: 1.8 mg/dL (ref 1.7–2.4)

## 2023-03-20 MED ORDER — ALUM & MAG HYDROXIDE-SIMETH 200-200-20 MG/5ML PO SUSP
30.0000 mL | ORAL | Status: DC | PRN
  Administered 2023-03-20: 30 mL via ORAL
  Filled 2023-03-20: qty 30

## 2023-03-20 MED ORDER — KETOROLAC TROMETHAMINE 15 MG/ML IJ SOLN
15.0000 mg | Freq: Once | INTRAMUSCULAR | Status: AC
  Administered 2023-03-20: 15 mg via INTRAVENOUS
  Filled 2023-03-20: qty 1

## 2023-03-20 MED ORDER — ORAL CARE MOUTH RINSE: 15.0000 mL | OROMUCOSAL | Status: DC | PRN

## 2023-03-20 MED ORDER — SODIUM CHLORIDE 0.9 % IV SOLN: INTRAVENOUS | Status: AC

## 2023-03-20 MED ORDER — SODIUM CHLORIDE 0.9 % IV BOLUS
500.0000 mL | Freq: Once | INTRAVENOUS | Status: AC
  Administered 2023-03-20: 500 mL via INTRAVENOUS

## 2023-03-20 MED ORDER — ADULT MULTIVITAMIN W/MINERALS CH
1.0000 | ORAL_TABLET | Freq: Every day | ORAL | Status: DC
  Administered 2023-03-21 – 2023-03-28 (×8): 1 via ORAL
  Filled 2023-03-20 (×8): qty 1

## 2023-03-20 NOTE — Progress Notes (Signed)
Pt admitted due to acute metabolic encephalopathy. Pt lives with her husband and is independent with ADLs at baseline per daughter. No needs reported at this time. TOC will follow.      03/20/23 0843  TOC Brief Assessment  Insurance and Status Reviewed  Patient has primary care physician Yes  Home environment has been reviewed Lives with husband.  Prior level of function: Independent.  Prior/Current Home Services No current home services  Social Determinants of Health Reivew SDOH reviewed no interventions necessary  Readmission risk has been reviewed Yes  Transition of care needs no transition of care needs at this time

## 2023-03-20 NOTE — Progress Notes (Signed)
Mobility Specialist Progress Note:   03/20/23 1148  Mobility  Activity Ambulated with assistance in hallway  Level of Assistance Modified independent, requires aide device or extra time  Assistive Device Other (Comment) (IV pole)  Distance Ambulated (ft) 60 ft  Range of Motion/Exercises Active;All extremities  Activity Response Tolerated well  Mobility Referral Yes  $Mobility charge 1 Mobility  Mobility Specialist Start Time (ACUTE ONLY) 1135  Mobility Specialist Stop Time (ACUTE ONLY) 1145  Mobility Specialist Time Calculation (min) (ACUTE ONLY) 10 min   Pt received ambulating in room, agreeable to hallway ambulation. Family member by her side during hallway ambulation with IV pole. Tolerated well, c/o dizziness upon arrival to room. Dizziness recovered after sitting up in chair. MD arrived in room, all needs met, alarm on.   Feliciana Rossetti Mobility Specialist Please contact via Special educational needs teacher or  Rehab office at [phone]

## 2023-03-20 NOTE — Plan of Care (Signed)
  Problem: Education: Goal: Ability to describe self-care measures that may prevent or decrease complications (Diabetes Survival Skills Education) will improve Outcome: Not Met (add Reason)   Problem: Coping: Goal: Ability to adjust to condition or change in health will improve Outcome: Not Met (add Reason)   Problem: Fluid Volume: Goal: Ability to maintain a balanced intake and output will improve Outcome: Not Met (add Reason)   Problem: Health Behavior/Discharge Planning: Goal: Ability to identify and utilize available resources and services will improve Outcome: Not Met (add Reason)   Problem: Nutritional: Goal: Maintenance of adequate nutrition will improve Outcome: Not Met (add Reason)

## 2023-03-20 NOTE — Plan of Care (Signed)
  Problem: Education: Goal: Ability to describe self-care measures that may prevent or decrease complications (Diabetes Survival Skills Education) will improve Outcome: Progressing   

## 2023-03-20 NOTE — Progress Notes (Signed)
Initial Nutrition Assessment  DOCUMENTATION CODES:   Obesity unspecified  INTERVENTION:  Liberalize diet to carbohydrate modified to remove restrictions and encourage PO intake in setting of recent decreased PO intake and unintentional weight loss.  Continue Ensure Enlive po BID, each supplement provides 350 kcal and 20 grams of protein.  Provide multivitamin with minerals po daily.  NUTRITION DIAGNOSIS:   Inadequate oral intake related to decreased appetite as evidenced by per patient/family report.  GOAL:   Patient will meet greater than or equal to 90% of their needs  MONITOR:   PO intake, Supplement acceptance, Labs, Weight trends, I & O's  REASON FOR ASSESSMENT:   Malnutrition Screening Tool    ASSESSMENT:   72 year old female with PMHx of DM type 2 with nephropathy, depression, breast cancer s/p bilateral mastectomy, GERD, hypothyroidism admitted with acute metabolic encephalopathy secondary to dehydration and hyponatremia.  RD is working remotely. Spoke with patient and her husband over the phone. Pt reports her appetite has been decreased for a few weeks. She reports she is unsure of the cause for decreased appetite. She has still been eating 3-4 small meals per day. Pt reports she has oatmeal for breakfast. She is only able to report that she has small meals at lunch and dinner, but unable to provide further details on intake at these meals. She denies food allergies/intolerances. Denies nausea, emesis, abdominal pain, or difficulty chewing/swallowing. Per H&P it was documented pt had nausea and vomiting after pt finished antibiotic. Pt is amenable to continue drinking Ensure to help meet calorie/protein needs with decreased appetite. Pt is currently on full liquid diet and is documented to have eaten 25% of breakfast. Reached out to MD to assess if pt will be able to have diet advanced. Per MD plan is to advance diet today past full liquids as pt no longer vomiting. Diet  was advanced to heart healthy/carb modified. Pt would benefit from liberalization of diet in setting of recent decreased appetite and PO intake.  Pt reports she has been losing weight over time unintentionally. Unsure of her UBW. Per review of chart pt was 85.4 kg on 01/17/23. She is currently 80.7 kg. That is a weight loss of 4.7 kg or 5.5% weight over the past 2 months, which is not significant for time frame but is concerning.  Medications reviewed and include: famotidine, Ensure Enlive BID, gabapentin, Novolog 0-5 units at bedtime, Novolog 0-9 units TID, Semglee 5 units at bedtime, levothyroxine, pantoprazole, NS at 100 mL/hour  Labs reviewed: CBG 80-132, Sodium 129, BUN 29, Creatinine 1.22 HgbA1c 6.4 03/19/23  UOP: 4 occurrences unmeasured UOP documented since admission  I/O :+1321.1 mL since admission (suspect not accurate as not measuring exact UOP)  NUTRITION - FOCUSED PHYSICAL EXAM:  Unable to complete as RD is working remotely  Diet Order:   Diet Order             Diet heart healthy/carb modified Room service appropriate? Yes; Fluid consistency: Thin  Diet effective now                  EDUCATION NEEDS:   No education needs have been identified at this time  Skin:  Skin Assessment: Reviewed RN Assessment  Last BM:  Unknown  Height:   Ht Readings from Last 1 Encounters:  03/19/23 5\' 3"  (1.6 m)   Weight:   Wt Readings from Last 1 Encounters:  03/19/23 80.7 kg   Ideal Body Weight:  52.3 kg  BMI:  Body mass index is 31.51 kg/m.  Estimated Nutritional Needs:   Kcal:  1700-1900  Protein:  85-95 grams  Fluid:  1.7-1.9 L/day  Letta Median, MS, RD, LDN, CNSC Pager number available on Amion

## 2023-03-20 NOTE — Progress Notes (Signed)
Progress Note   Patient: Kimberly [name] [id number]:[id number] DOB: May 16, 1951 DOA: 03/19/2023     0 DOS: the patient was seen and examined on 03/20/2023   Brief hospital admission narrative: Kimberly [name] is a 72 y.o. female with medical history significant of type 2 diabetes with nephropathy, depression, history of breast cancer (status post bilateral mastectomy), gastroesophageal flux disease, class I obesity, and hypothyroidism; who presented to the hospital secondary to temperature and altered mental status.  Symptoms have been present approximately since Friday (03/16/23) and worsening. Patient with recent UTI treated with Bactrim as an outpatient; however after finishing antibiotic has developed nausea vomiting and decreased oral intake.  Patient's daughter noticed some associated weight loss and signs of dehydration.  Patient expressed continue taking her meds despite no eating and drinking well. Has not been any abdominal pain, hematuria, melena, hematochezia, focal weaknesses, chest pain, shortness of breath, coughing spells or sick contacts. Daughter reported son fluctuating low-grade temperature for what the mom has been using Tylenol at home.   In the ED no fever appreciated; patient found to be dehydrated with acute kidney injury on chronic renal failure and hyponatremia (sodium 123); PCR negative no acute intracranial normalities on CT head; chest x-ray also negative for acute process.  Patient with normal WBCs.  Urinalysis not demonstrating acute infection.     TRH has been consulted to place patient in the hospital for further evaluation and management status.  To provide treatment for hyponatremia and acute kidney injury.    Assessment and Plan: * Acute metabolic encephalopathy -Secondary to dehydration and hyponatremia -Patient with recent UTI as an outpatient adequately treated and currently not complaining of dysuria.  Urinalysis at time of admission also failed to demonstrate  acute infection. -Continue to maintain adequate hydration -Continue to replete electrolytes as needed and follow clinical response -Continue to minimize sedative agents and provide constant reorientation. -TSH and B12 WNL -CT head without acute intracranial abnormalities.  Class 1 obesity -Body mass index is 31.51 kg/m. -Low-calorie diet and portion control discussed with patient.  Hyponatremia -In the setting of decreased oral intake and continued use of diuretics -Continue fluid resuscitation and follow electrolytes trend; current sodium 129 -Continue to holding HCTZ at the moment.  Mixed hyperlipidemia -continue to follow lipid panel as an outpatient -continue low fat/heart healthy diet  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) -A1c 6.4 -Continue to hold oral hypoglycemic agents while inpatient -Continue sliding scale insulin and the use of Semglee.  Breast cancer of upper-outer quadrant of right female breast (HCC) -Continue outpatient follow-up with oncology service -Continue the use of Arimidex.  Nonalcoholic steatohepatitis (NASH) -Stable LFTs -Continue outpatient follow-up with gastroenterology service.  Depression -No suicidal ideation or hallucinations -Continue home antihypertensive agents.  Hypothyroidism -Continue Synthroid.  Essential hypertension, benign -Continue holding HCTZ and losartan -Continue to follow vital signs -Avoid hypotension.;  Will continue current antihypertensive agents. -Heart healthy diet discussed with patient.   Subjective:  Oriented x 2 and more interactive; mentation now back to baseline as she continues to demonstrate poor insight.  No nausea, no vomiting, no chest pain.  Physical Exam: Vitals:   03/19/23 2330 03/20/23 0406 03/20/23 1035 03/20/23 1422  BP: (!) 108/56 (!) 117/54 (!) 137/54 (!) 103/37  Pulse: 65 81 81 77  Resp: 18 19  19   Temp: 98.3 F (36.8 C) 98.2 F (36.8 C) (!) 101 F (38.3 C) 98.7 F (37.1  C)  TempSrc: Oral  Oral Oral  SpO2: 95% 95% 99%  98%  Weight:      Height:       General exam: Alert, awake, oriented x person and place; demonstrating poor insight and per family at bedside mentation now back to baseline.  No chest pain, no further episode of nausea vomiting. Respiratory system: Clear to auscultation. Respiratory effort normal.  Good saturation on room air.  No using accessory muscle. Cardiovascular system:RRR. No rubs or gallops; no JVD. Gastrointestinal system: Abdomen is nondistended, soft and nontender. No organomegaly or masses felt. Normal bowel sounds heard. Central nervous system: Moving 4 limbs spontaneously.  Moving 4 limbs spontaneously.  No focal neurological deficits. Extremities: No cyanosis or clubbing. Skin: No petechiae. Psychiatry: Judgement and insight appear impaired secondary to acute encephalopathic process.  Able to follow simple commands.  Data Reviewed: Basic metabolic panel: Sodium 129, potassium 3.9, chloride 102, bicarb 17, BUN 29, creatinine 1.22 and GFR 47 CBC: White blood cell 6.9, hemoglobin 11.3 and platelet count 153 K Magnesium: 1.8  Family Communication: Husband at bedside  Disposition: Status is: Inpatient Remains inpatient appropriate because: Continue fluid resuscitation and electrolyte repletion; advance diet and follow response.  Patient renal function and mentation still not back to baseline.   Planned Discharge Destination: Home   Time spent: 35 minutes  Author: Vassie Loll, MD 03/20/2023 2:34 PM  For on call review [email or web address].

## 2023-03-20 NOTE — Progress Notes (Signed)
   03/20/23 2023  Assess: MEWS Score  Temp (!) 103.1 F (39.5 C)  BP 129/68  MAP (mmHg) 84  Pulse Rate 95  Resp 20  SpO2 95 %  O2 Device Room Air  Assess: MEWS Score  MEWS Temp 2  MEWS Systolic 0  MEWS Pulse 0  MEWS RR 0  MEWS LOC 0  MEWS Score 2  MEWS Score Color Yellow  Assess: if the MEWS score is Yellow or Red  Were vital signs taken at a resting state? Yes  Focused Assessment No change from prior assessment  Does the patient meet 2 or more of the SIRS criteria? No  MEWS guidelines implemented  Yes, yellow  Treat  MEWS Interventions Considered administering scheduled or prn medications/treatments as ordered  Take Vital Signs  Increase Vital Sign Frequency  Yellow: Q2hr x1, continue Q4hrs until patient remains green for 12hrs  Escalate  MEWS: Escalate Yellow: Discuss with charge nurse and consider notifying provider and/or RRT  Notify: Charge Nurse/RN  Name of Charge Nurse/RN Notified Lemont Fillers, RN  Provider Notification  Provider Name/Title Zierle  Date Provider Notified 03/20/23  Time Provider Notified 2030  Method of Notification Page  Notification Reason Other (Comment) (MEWS, Temp)  Provider response See new orders  Assess: SIRS CRITERIA  SIRS Temperature  1  SIRS Pulse 1  SIRS Respirations  0  SIRS WBC 0  SIRS Score Sum  2

## 2023-03-21 DIAGNOSIS — G9341 Metabolic encephalopathy: Secondary | ICD-10-CM | POA: Diagnosis not present

## 2023-03-21 LAB — CULTURE, BLOOD (ROUTINE X 2): Special Requests: ADEQUATE

## 2023-03-21 LAB — URINE CULTURE: Culture: 20000 — AB

## 2023-03-21 LAB — GLUCOSE, CAPILLARY
Glucose-Capillary: 134 mg/dL — ABNORMAL HIGH (ref 70–99)
Glucose-Capillary: 169 mg/dL — ABNORMAL HIGH (ref 70–99)
Glucose-Capillary: 147 mg/dL — ABNORMAL HIGH (ref 70–99)
Glucose-Capillary: 158 mg/dL — ABNORMAL HIGH (ref 70–99)

## 2023-03-21 LAB — BASIC METABOLIC PANEL
Anion gap: 7 (ref 5–15)
BUN: 28 mg/dL — ABNORMAL HIGH (ref 8–23)
CO2: 20 mmol/L — ABNORMAL LOW (ref 22–32)
Calcium: 7.9 mg/dL — ABNORMAL LOW (ref 8.9–10.3)
Chloride: 101 mmol/L (ref 98–111)
Creatinine, Ser: 1.15 mg/dL — ABNORMAL HIGH (ref 0.44–1.00)
GFR, Estimated: 51 mL/min — ABNORMAL LOW (ref >60)
Glucose, Bld: 141 mg/dL — ABNORMAL HIGH (ref 70–99)
Potassium: 3.9 mmol/L (ref 3.5–5.1)
Sodium: 128 mmol/L — ABNORMAL LOW (ref 135–145)

## 2023-03-21 LAB — SARS CORONAVIRUS 2 BY RT PCR: SARS Coronavirus 2 by RT PCR: NEGATIVE

## 2023-03-21 MED ORDER — KETOROLAC TROMETHAMINE 15 MG/ML IJ SOLN
15.0000 mg | Freq: Once | INTRAMUSCULAR | Status: AC
  Administered 2023-03-21: 15 mg via INTRAVENOUS
  Filled 2023-03-21: qty 1

## 2023-03-21 NOTE — Progress Notes (Signed)
Progress Note   Patient: Kimberly [name] BJY:[id number] DOB: 06-25-1951 DOA: 03/19/2023     1 DOS: the patient was seen and examined on 03/21/2023   Brief hospital admission narrative: Kimberly [name] is a 72 y.o. female with medical history significant of type 2 diabetes with nephropathy, depression, history of breast cancer (status post bilateral mastectomy), gastroesophageal flux disease, class I obesity, and hypothyroidism; who presented to the hospital secondary to temperature and altered mental status.  Symptoms have been present approximately since Friday (03/16/23) and worsening. Patient with recent UTI treated with Bactrim as an outpatient; however after finishing antibiotic has developed nausea vomiting and decreased oral intake.  Patient's daughter noticed some associated weight loss and signs of dehydration.  Patient expressed continue taking her meds despite no eating and drinking well. Has not been any abdominal pain, hematuria, melena, hematochezia, focal weaknesses, chest pain, shortness of breath, coughing spells or sick contacts. Daughter reported son fluctuating low-grade temperature for what the mom has been using Tylenol at home.   In the ED no fever appreciated; patient found to be dehydrated with acute kidney injury on chronic renal failure and hyponatremia (sodium 123); PCR negative no acute intracranial normalities on CT head; chest x-ray also negative for acute process.  Patient with normal WBCs.  Urinalysis not demonstrating acute infection.     TRH has been consulted to place patient in the hospital for further evaluation and management status.  To provide treatment for hyponatremia and acute kidney injury.    Assessment and Plan: 1)Acute metabolic encephalopathy--multifactorial -Patient with fevers, dehydration, hyponatremia  -Continue to minimize sedative agents and provide constant reorientation. -TSH and B12 WNL -CT head without acute intracranial  abnormalities. -Blood cultures from 03/19/2023 and repeat blood cultures from 03/21/2023 pending -Urine culture from 03/20/2023 pending  2) persistent fevers-- Confusion and disorientation persist. -Fevers persist -Tmax 103 T-current 101 -Blood and urine cultures as above #1 -Holding off on antibiotics at this time as no source has been identified -COVID test negative on 03/19/2023, repeat COVID test negative again on 03/21/2023 -She was treated with Bactrim for 1 week prior to admission for E. coli UTI based on sensitivities as per  urine culture from 03/09/2023  Class 1 obesity -Body mass index is 31.51 kg/m. -Low-calorie diet and portion control discussed with patient.  Hyponatremia -In the setting of decreased oral intake and continued use of diuretics -Sodium improved with discontinuation of hydrochlorothiazide -On admission sodium was 123  Mixed hyperlipidemia -continue to follow lipid panel as an outpatient -continue low fat/heart healthy diet  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) -A1c 6.4 -Continue to hold oral hypoglycemic agents while inpatient -Continue sliding scale insulin and the use of Semglee.  Breast cancer of upper-outer quadrant of right female breast (HCC) -Continue outpatient follow-up with oncology service -Continue the use of Arimidex.  Nonalcoholic steatohepatitis (NASH) -Stable LFTs -Continue outpatient follow-up with gastroenterology service.  Depression -No suicidal ideation or hallucinations -Continue home antihypertensive agents.  Hypothyroidism -Continue Synthroid.  Essential hypertension, benign -Continue holding HCTZ and losartan -Continue to follow vital signs -Avoid hypotension.;  Will continue current antihypertensive agents. -Heart healthy diet discussed with patient.   Subjective:  Husband and daughter Okey Dupre at bedside - Confusion and disorientation persist. - Fevers persist -Tmax 103 T-current 101  Physical  Exam: Vitals:   03/21/23 0801 03/21/23 1118 03/21/23 1341 03/21/23 1823  BP: 118/73 112/62 (!) 115/45 (!) 104/51  Pulse: 95 91 85 90  Resp:  16  Temp: 98.6 F (37 C) (!) 103.1 F (39.5 C) (!) 101.7 F (38.7 C) (!) 101.2 F (38.4 C)  TempSrc: Oral Oral Oral Oral  SpO2: 99% 93% 95% 96%  Weight:      Height:        Physical Exam  Gen:- Awake , confused, disoriented, cooperative HEENT:- [place].AT, No sclera icterus Neck-Supple Neck,No JVD,.  Lungs-  CTAB , fair air movement bilaterally  CV- S1, S2 normal, RRR Abd-  +ve B.Sounds, Abd Soft, No tenderness, no CVA area tenderness    Extremity/Skin:- No  edema,   good pedal pulses  Psych-confused, disoriented, cooperative, no agitation  neuro-generalized weakness, no new focal deficits, no tremors   Family Communication: Husband and daughter Okey Dupre at bedside  Disposition: To be determined may need SNF rehab Status is: Inpatient Remains inpatient appropriate because: Continue fluid resuscitation and electrolyte repletion; advance diet and follow response.  Patient renal function and mentation still not back to baseline.   Planned Discharge Destination: Home   Author: Shon Hale, MD 03/21/2023 7:25 PM  For on call review [email or web address].

## 2023-03-21 NOTE — Progress Notes (Signed)
   03/21/23 2119  Assess: MEWS Score  Temp (!) 102.7 F (39.3 C)  BP (!) 145/66  MAP (mmHg) 77  Pulse Rate 87  Resp 20  SpO2 98 %  O2 Device Room Air  Assess: MEWS Score  MEWS Temp 2  MEWS Systolic 0  MEWS Pulse 0  MEWS RR 0  MEWS LOC 0  MEWS Score 2  MEWS Score Color Yellow  Assess: if the MEWS score is Yellow or Red  Were vital signs accurate and taken at a resting state? Yes  Does the patient meet 2 or more of the SIRS criteria? No  MEWS guidelines implemented  Yes, yellow  Treat  MEWS Interventions Considered administering scheduled or prn medications/treatments as ordered  Take Vital Signs  Increase Vital Sign Frequency  Yellow: Q2hr x1, continue Q4hrs until patient remains green for 12hrs  Escalate  MEWS: Escalate Yellow: Discuss with charge nurse and consider notifying provider and/or RRT  Notify: Charge Nurse/RN  Name of Charge Nurse/RN Notified Alli  Provider Notification  Provider Name/Title MD Thomes Dinning  Date Provider Notified 03/21/23  Time Provider Notified 2128  Method of Notification Page  Notification Reason Other (Comment) (temp, mews)  Provider response Other (Comment) (awaiting response)  Assess: SIRS CRITERIA  SIRS Temperature  1  SIRS Pulse 0  SIRS Respirations  0  SIRS WBC 0  SIRS Score Sum  1

## 2023-03-21 NOTE — TOC Initial Note (Signed)
Transition of Care [facility]) - Initial/Assessment Note    Patient Details  Name: Kimberly [name] MRN: [id number] Date of Birth: [birth year]  [facility]) CM/SW Contact:    Karn Cassis, LCSW Phone Number: 03/21/2023, 7:46 AM  Clinical Narrative: Pt admitted for acute metabolic encephalopathy. Assessment completed due to high risk readmission score. Pt lives with her husband and is independent with ADLs at baseline. Her PCP is Dr. Adriana Simas. Plan is for return home when medically stable. No home health services prior to admission. TOC will continue to follow.                  Expected Discharge Plan: Home/Self Care Barriers to Discharge: Continued Medical Work up   Patient Goals and CMS Choice Patient states their goals for this hospitalization and ongoing recovery are:: return home     [place] ownership interest in [facility].provided to::  (n/a)    Expected Discharge Plan and Services In-house Referral: Clinical Social Work     Living arrangements for the past 2 months: Single Family Home                                      Prior Living Arrangements/Services Living arrangements for the past 2 months: Single Family Home Lives with:: Spouse Patient language and need for interpreter reviewed:: Yes Do you feel safe going back to the place where you live?: Yes      Need for Family Participation in Patient Care: Yes (Comment)     Criminal Activity/Legal Involvement Pertinent to Current Situation/Hospitalization: No - Comment as needed  Activities of Daily Living Home Assistive Devices/Equipment: None ADL Screening (condition at time of admission) Patient's cognitive ability adequate to safely complete daily activities?: Yes Is the patient deaf or have difficulty hearing?: No Does the patient have difficulty seeing, even when wearing glasses/contacts?: No Does the patient have difficulty concentrating, remembering, or making decisions?:  No Patient able to express need for assistance with ADLs?: No Does the patient have difficulty dressing or bathing?: No Independently performs ADLs?: Yes (appropriate for developmental age) Does the patient have difficulty walking or climbing stairs?: No Weakness of Legs: Both Weakness of Arms/Hands: Both  Permission Sought/Granted                  Emotional Assessment   Attitude/Demeanor/Rapport: Unable to Assess Affect (typically observed): Unable to Assess Orientation: : Oriented to Self Alcohol / Substance Use: Not Applicable Psych Involvement: No (comment)  Admission diagnosis:  Hyponatremia [E87.1] AKI (acute kidney injury) (HCC) [N17.9] Acute metabolic encephalopathy [G93.41] Patient Active Problem List   Diagnosis Date Noted   Acute metabolic encephalopathy 03/19/2023   Hyponatremia 03/19/2023   Class 1 obesity 03/19/2023   Fever 01/16/2023   Mixed hyperlipidemia 08/24/2022   Genetic testing 10/25/2021   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) 09/05/2021   Vitamin D deficiency 09/05/2021   Breast cancer of upper-outer quadrant of right female breast (HCC) 08/31/2021   S/P mastectomy, right 07/29/2021   Nonalcoholic steatohepatitis (NASH) 11/29/2020   Urinary incontinence 05/07/2020   Depression 12/29/2015   S/P knee replacement 09/02/2013   Essential hypertension, benign 07/06/2013   Hypothyroidism 07/06/2013   PCP:  Tommie Sams, DO Pharmacy:   [facility] - [place], [place] - [address] [place] [address] Phone: [phone] Fax: [phone]  [facility] - [place], [place] - [address] [facility] AT Portal to Registered [facility] Sites [address] [place] [address] Phone: [phone] Fax: [phone]  [facility] [address], [place] - [address] [place] [address] [place] [address] [place] [address] Phone: [phone] Fax: [phone]  [facility] [address] [place] - [address] [place] [address] Phone: [phone] Fax: [phone]  [facility] - [place], [place] - [address] [place] [address] Phone: [phone] Fax: [phone]     Social Determinants of Health (SDOH) Social History: SDOH Screenings   Food Insecurity: No Food Insecurity (03/19/2023)  Housing: Low Risk  (03/19/2023)  Transportation Needs: No Transportation Needs (03/19/2023)  Utilities: Not At Risk (03/19/2023)  Alcohol Screen: Low Risk  (02/16/2023)  Depression (PHQ2-9): Low Risk  (02/16/2023)  Recent Concern: Depression (PHQ2-9) - Medium Risk (01/29/2023)  Financial Resource Strain: Low Risk  (02/16/2023)  Physical Activity: Insufficiently Active (02/16/2023)  Social Connections: Socially Integrated (02/16/2023)  Stress: No Stress Concern Present (02/16/2023)  Tobacco Use: Low Risk  (03/19/2023)   SDOH Interventions:     Readmission Risk Interventions    03/21/2023    7:45 AM  Readmission Risk Prevention Plan  Transportation Screening Complete  HRI or Home Care Consult Complete  Social Work Consult for Recovery Care Planning/Counseling Complete  Palliative Care Screening Not Applicable  Medication Review Oceanographer) Complete

## 2023-03-21 NOTE — Progress Notes (Signed)
Mobility Specialist Progress Note:    03/21/23 1255  Mobility  Activity Contraindicated/medical hold  Mobility Referral Yes  Mobility Specialist Start Time (ACUTE ONLY) 1255  Mobility Specialist Stop Time (ACUTE ONLY) 1258  Mobility Specialist Time Calculation (min) (ACUTE ONLY) 3 min   Deferred mobility d/t pt experiencing high fever. Left pt in room with family, all needs met.   Feliciana Rossetti Mobility Specialist Please contact via Special educational needs teacher or  Rehab office at [phone]

## 2023-03-21 NOTE — Progress Notes (Signed)
   03/21/23 1118  Assess: MEWS Score  Temp (!) 103.1 F (39.5 C)  BP 112/62  MAP (mmHg) 78  Pulse Rate 91  Resp 16  Level of Consciousness Alert  SpO2 93 %  O2 Device Room Air  Assess: MEWS Score  MEWS Temp 2  MEWS Systolic 0  MEWS Pulse 0  MEWS RR 0  MEWS LOC 0  MEWS Score 2  MEWS Score Color Yellow  Assess: if the MEWS score is Yellow or Red  Were vital signs accurate and taken at a resting state? Yes  Does the patient meet 2 or more of the SIRS criteria? No  MEWS guidelines implemented  Yes, yellow  Treat  MEWS Interventions Considered administering scheduled or prn medications/treatments as ordered  Take Vital Signs  Increase Vital Sign Frequency  Yellow: Q2hr x1, continue Q4hrs until patient remains green for 12hrs  Escalate  MEWS: Escalate Yellow: Discuss with charge nurse and consider notifying provider and/or RRT  Provider Notification  Provider Name/Title MD Courage  Date Provider Notified 03/21/23  Time Provider Notified 1123  Provider response  (awaiting provider response)  Assess: SIRS CRITERIA  SIRS Temperature  1  SIRS Pulse 1  SIRS Respirations  0  SIRS WBC 0  SIRS Score Sum  2

## 2023-03-21 NOTE — Plan of Care (Signed)
  Problem: Metabolic: Goal: Ability to maintain appropriate glucose levels will improve Outcome: Not Progressing   Problem: Nutritional: Goal: Maintenance of adequate nutrition will improve Outcome: Not Progressing   Problem: Health Behavior/Discharge Planning: Goal: Ability to manage health-related needs will improve Outcome: Not Progressing

## 2023-03-22 ENCOUNTER — Inpatient Hospital Stay (HOSPITAL_COMMUNITY): Payer: Medicare Other

## 2023-03-22 DIAGNOSIS — G9341 Metabolic encephalopathy: Secondary | ICD-10-CM | POA: Diagnosis not present

## 2023-03-22 LAB — CULTURE, BLOOD (ROUTINE X 2)
Culture: NO GROWTH
Culture: NO GROWTH
Special Requests: ADEQUATE
Special Requests: ADEQUATE

## 2023-03-22 LAB — CBC
HCT: 28.2 % — ABNORMAL LOW (ref 36.0–46.0)
Hemoglobin: 9.6 g/dL — ABNORMAL LOW (ref 12.0–15.0)
MCH: 31.7 pg (ref 26.0–34.0)
MCHC: 34 g/dL (ref 30.0–36.0)
MCV: 93.1 fL (ref 80.0–100.0)
Platelets: 120 10*3/uL — ABNORMAL LOW (ref 150–400)
RBC: 3.03 MIL/uL — ABNORMAL LOW (ref 3.87–5.11)
RDW: 13.2 % (ref 11.5–15.5)
WBC: 4.9 10*3/uL (ref 4.0–10.5)
nRBC: 0 % (ref 0.0–0.2)

## 2023-03-22 LAB — GLUCOSE, CAPILLARY
Glucose-Capillary: 155 mg/dL — ABNORMAL HIGH (ref 70–99)
Glucose-Capillary: 140 mg/dL — ABNORMAL HIGH (ref 70–99)
Glucose-Capillary: 112 mg/dL — ABNORMAL HIGH (ref 70–99)
Glucose-Capillary: 147 mg/dL — ABNORMAL HIGH (ref 70–99)

## 2023-03-22 LAB — RENAL FUNCTION PANEL
Albumin: 2.8 g/dL — ABNORMAL LOW (ref 3.5–5.0)
Anion gap: 7 (ref 5–15)
BUN: 26 mg/dL — ABNORMAL HIGH (ref 8–23)
CO2: 19 mmol/L — ABNORMAL LOW (ref 22–32)
Calcium: 7.8 mg/dL — ABNORMAL LOW (ref 8.9–10.3)
Chloride: 100 mmol/L (ref 98–111)
Creatinine, Ser: 1.14 mg/dL — ABNORMAL HIGH (ref 0.44–1.00)
GFR, Estimated: 51 mL/min — ABNORMAL LOW (ref >60)
Glucose, Bld: 158 mg/dL — ABNORMAL HIGH (ref 70–99)
Phosphorus: 1.7 mg/dL — ABNORMAL LOW (ref 2.5–4.6)
Potassium: 3.5 mmol/L (ref 3.5–5.1)
Sodium: 126 mmol/L — ABNORMAL LOW (ref 135–145)

## 2023-03-22 LAB — PROCALCITONIN: Procalcitonin: 0.9 ng/mL

## 2023-03-22 LAB — URINE CULTURE

## 2023-03-22 MED ORDER — SODIUM CHLORIDE 0.9 % IV SOLN: INTRAVENOUS | Status: AC

## 2023-03-22 MED ORDER — CIPROFLOXACIN IN D5W 400 MG/200ML IV SOLN
400.0000 mg | Freq: Two times a day (BID) | INTRAVENOUS | Status: DC
  Administered 2023-03-22 – 2023-03-24 (×4): 400 mg via INTRAVENOUS
  Filled 2023-03-22 (×4): qty 200

## 2023-03-22 MED ORDER — SODIUM CHLORIDE 0.9 % IV SOLN
2.0000 g | INTRAVENOUS | Status: DC
  Administered 2023-03-22: 2 g via INTRAVENOUS
  Filled 2023-03-22: qty 20

## 2023-03-22 MED ORDER — POTASSIUM PHOSPHATES 15 MMOLE/5ML IV SOLN
30.0000 mmol | Freq: Once | INTRAVENOUS | Status: AC
  Administered 2023-03-22: 30 mmol via INTRAVENOUS
  Filled 2023-03-22: qty 10

## 2023-03-22 NOTE — Progress Notes (Signed)
Progress Note   Patient: Kimberly [name] ZOX:[id number] DOB: [birth year] DOA: 03/19/2023     2 DOS: the patient was seen and examined on 03/22/2023   Brief hospital admission narrative: Kimberly [name] is a 72 y.o. female with medical history significant of type 2 diabetes with nephropathy, depression, history of breast cancer (status post bilateral mastectomy), gastroesophageal flux disease, class I obesity, and hypothyroidism; who presented to the hospital secondary to temperature and altered mental status.  Symptoms have been present approximately since Friday (03/16/23) and worsening. Patient with recent UTI treated with Bactrim as an outpatient; however after finishing antibiotic has developed nausea vomiting and decreased oral intake.  Patient's daughter noticed some associated weight loss and signs of dehydration.  Patient expressed continue taking her meds despite no eating and drinking well. Has not been any abdominal pain, hematuria, melena, hematochezia, focal weaknesses, chest pain, shortness of breath, coughing spells or sick contacts. Daughter reported son fluctuating low-grade temperature for what the mom has been using Tylenol at home.   In the ED no fever appreciated; patient found to be dehydrated with acute kidney injury on chronic renal failure and hyponatremia (sodium 123); PCR negative no acute intracranial normalities on CT head; chest x-ray also negative for acute process.  Patient with normal WBCs.  Urinalysis not demonstrating acute infection.     TRH has been consulted to place patient in the hospital for further evaluation and management status.  To provide treatment for hyponatremia and acute kidney injury.    Assessment and Plan: 1)Acute metabolic encephalopathy--multifactorial -Patient with fevers, dehydration, hyponatremia  -Continue to minimize sedative agents and provide constant reorientation. -TSH and B12 WNL -CT head without acute intracranial  abnormalities. -Blood cultures from 03/19/2023 and repeat blood cultures from 03/21/2023 NGTD -Urine culture from 03/20/2023 with staph Epi  2)Persistent Fevers-- -as of 03/22/2023 fever curve is trending down Improving confusion and disorientation  -Blood and urine cultures as above #1 -COVID test negative on 03/19/2023, repeat COVID test negative again on 03/21/2023 -She was treated with Bactrim for 1 week prior to admission for E. coli UTI based on sensitivities as per  urine culture from 03/09/2023 -Repeat urine culture from 03/20/2023 with staph epi, elevated procalcitonin noted -No leukocytosis -Altered mentation persists -Okay to treat with Cipro starting 03/22/2023 -Chest x-ray on 03/22/2023 without acute findings  3)Hypophosphatemia----due to poor oral intake  -replace  Class 1 obesity -Body mass index is 31.51 kg/m. -Low-calorie diet and portion control discussed with patient.  Hyponatremia --On admission sodium was 123 -In the setting of decreased oral intake and continued use of diuretics PTA -Sodium improved with discontinuation of hydrochlorothiazide   Mixed hyperlipidemia -continue to follow lipid panel as an outpatient -continue low fat/heart healthy diet  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) -A1c 6.4 -Continue to hold oral hypoglycemic agents while inpatient -Continue sliding scale insulin and the use of Semglee.  Breast cancer of upper-outer quadrant of right female breast (HCC) -Continue outpatient follow-up with oncology service -Continue the use of Arimidex.  Nonalcoholic steatohepatitis (NASH) -Stable LFTs -Continue outpatient follow-up with gastroenterology service.  Depression -No suicidal ideation or hallucinations -Continue home antihypertensive agents.  Hypothyroidism -Continue Synthroid.  Essential hypertension, benign -Continue holding HCTZ and losartan -Continue metoprolol -Avoid hypotension.    Subjective:  -Less sleepy,  less confused, less disoriented --Fever curve appears to be trending down -Dry cough noted  Physical Exam: Vitals:   03/21/23 1823 03/21/23 2119 03/22/23 0313 03/22/23 1319  BP: (!) 104/51 (!) 145/66 Marland Kitchen)  152/96 99/64  Pulse: 90 87 99   Resp:  20 20 19   Temp: (!) 101.2 F (38.4 C) (!) 102.7 F (39.3 C) 100 F (37.8 C) 99.2 F (37.3 C)  TempSrc: Oral Oral  Oral  SpO2: 96% 98% 97% 98%  Weight:      Height:        Physical Exam Gen:- Awake , more interactive , appears comfortable HEENT:- [place].AT, No sclera icterus Neck-Supple Neck,No JVD,.  Lungs-  CTAB , fair air movement bilaterally  CV- S1, S2 normal, RRR Abd-  +ve B.Sounds, Abd Soft, No tenderness, no CVA area tenderness    Extremity/Skin:- No  edema,   good pedal pulses  Psych-episodes of disorientation and confusion from time to time cooperative, no agitation   neuro-generalized weakness, no new focal deficits, no tremors  Family Communication: Husband and daughter Okey Dupre updated  Disposition: To be determined may need SNF rehab Status is: Inpatient  Author: Shon Hale, MD 03/22/2023 4:55 PM  For on call review [email or web address].

## 2023-03-22 NOTE — Progress Notes (Signed)
Nutrition Follow-up  DOCUMENTATION CODES:   Obesity unspecified  INTERVENTION:  - Encourage good PO intake - Continue Ensure Enlive po BID, each supplement provides 350 kcal and 20 grams of protein. - Continue multivitamin with minerals po daily. -Recommend phosphorus repletion.   NUTRITION DIAGNOSIS:   Inadequate oral intake related to decreased appetite as evidenced by per patient/family report. - Ongoing   GOAL:   Patient will meet greater than or equal to 90% of their needs - Not met  MONITOR:   PO intake, Supplement acceptance, Labs, Weight trends, I & O's  REASON FOR ASSESSMENT:   Malnutrition Screening Tool    ASSESSMENT:   72 year old female with PMHx of DM type 2 with nephropathy, depression, breast cancer s/p bilateral mastectomy, GERD, hypothyroidism admitted with acute metabolic encephalopathy secondary to dehydration and hyponatremia.  Pt laying in bed, husband at bedside, Reports that pt did not eat breakfast today or lunch yesterday. When asked pt says she has not been drinking the Ensure's. Husband thinks she drank one yesterday but was unsure. He adds he brought grapes in today and she was eating those. RD encouraged to continue to try and drink supplements until PO intake improves.   Reached out to MD to replete phosphorus due to low level.   Meal Intake 7/23-7/24: 20-70% x 5 meals   Medications reviewed and include: Pepcid, NovoLog SSI, Semglee, MVI, Protonix, IV antibiotics  Labs reviewed: Sodium 126, Potassium 3.5, BUN 26, Creatinine 41.14, Phosphorus 1.7 CBG: 140-169 x 24 hrs   NUTRITION - FOCUSED PHYSICAL EXAM:  Flowsheet Row Most Recent Value  Orbital Region No depletion  Upper Arm Region No depletion  Thoracic and Lumbar Region No depletion  Buccal Region No depletion  Temple Region No depletion  Clavicle Bone Region No depletion  Clavicle and Acromion Bone Region No depletion  Scapular Bone Region No depletion  Dorsal Hand Unable to  assess  Patellar Region Mild depletion  Anterior Thigh Region Unable to assess  Posterior Calf Region Mild depletion  Edema (RD Assessment) None   Diet Order:   Diet Order             Diet Carb Modified Fluid consistency: Thin; Room service appropriate? Yes  Diet effective now                  EDUCATION NEEDS:   No education needs have been identified at this time  Skin:  Skin Assessment: Reviewed RN Assessment  Last BM:  7/25 - type 4  Height:   Ht Readings from Last 1 Encounters:  03/19/23 5\' 3"  (1.6 m)   Weight:   Wt Readings from Last 1 Encounters:  03/19/23 80.7 kg   Ideal Body Weight:  52.3 kg  BMI:  Body mass index is 31.51 kg/m.  Estimated Nutritional Needs:   Kcal:  1700-1900 Protein:  85-95 grams Fluid:  1.7-1.9 L/day  Kirby Crigler RD, LDN Clinical Dietitian See [facility] for contact information.

## 2023-03-22 NOTE — Plan of Care (Signed)
  Problem: Education: Goal: Ability to describe self-care measures that may prevent or decrease complications (Diabetes Survival Skills Education) will improve Outcome: Progressing   Problem: Fluid Volume: Goal: Ability to maintain a balanced intake and output will improve Outcome: Progressing

## 2023-03-23 DIAGNOSIS — R509 Fever, unspecified: Secondary | ICD-10-CM | POA: Diagnosis not present

## 2023-03-23 DIAGNOSIS — G9341 Metabolic encephalopathy: Secondary | ICD-10-CM | POA: Diagnosis not present

## 2023-03-23 LAB — BASIC METABOLIC PANEL
Anion gap: 9 (ref 5–15)
BUN: 19 mg/dL (ref 8–23)
CO2: 16 mmol/L — ABNORMAL LOW (ref 22–32)
Calcium: 7.3 mg/dL — ABNORMAL LOW (ref 8.9–10.3)
Chloride: 100 mmol/L (ref 98–111)
Creatinine, Ser: 0.92 mg/dL (ref 0.44–1.00)
GFR, Estimated: >60 mL/min (ref >60)
Glucose, Bld: 137 mg/dL — ABNORMAL HIGH (ref 70–99)
Potassium: 3.7 mmol/L (ref 3.5–5.1)
Sodium: 125 mmol/L — ABNORMAL LOW (ref 135–145)

## 2023-03-23 LAB — URINALYSIS, ROUTINE W REFLEX MICROSCOPIC
Bacteria, UA: NONE SEEN
Bilirubin Urine: NEGATIVE
Glucose, UA: NEGATIVE mg/dL
Ketones, ur: 5 mg/dL — AB
Leukocytes,Ua: NEGATIVE
Nitrite: NEGATIVE
Protein, ur: 30 mg/dL — AB
Specific Gravity, Urine: 1.013 (ref 1.005–1.030)
pH: 5 (ref 5.0–8.0)

## 2023-03-23 LAB — CBC WITH DIFFERENTIAL/PLATELET
Abs Immature Granulocytes: 0.01 10*3/uL (ref 0.00–0.07)
Basophils Absolute: 0 10*3/uL (ref 0.0–0.1)
Basophils Relative: 0 %
Eosinophils Absolute: 0.1 10*3/uL (ref 0.0–0.5)
Eosinophils Relative: 3 %
HCT: 26.6 % — ABNORMAL LOW (ref 36.0–46.0)
Hemoglobin: 9.3 g/dL — ABNORMAL LOW (ref 12.0–15.0)
Immature Granulocytes: 0 %
Lymphocytes Relative: 29 %
Lymphs Abs: 1 10*3/uL (ref 0.7–4.0)
MCH: 31.7 pg (ref 26.0–34.0)
MCHC: 35 g/dL (ref 30.0–36.0)
MCV: 90.8 fL (ref 80.0–100.0)
Monocytes Absolute: 0.3 10*3/uL (ref 0.1–1.0)
Monocytes Relative: 8 %
Neutro Abs: 2 10*3/uL (ref 1.7–7.7)
Neutrophils Relative %: 60 %
Platelets: 132 10*3/uL — ABNORMAL LOW (ref 150–400)
RBC: 2.93 MIL/uL — ABNORMAL LOW (ref 3.87–5.11)
RDW: 13.2 % (ref 11.5–15.5)
WBC Morphology: REACTIVE
WBC: 3.4 10*3/uL — ABNORMAL LOW (ref 4.0–10.5)
nRBC: 0 % (ref 0.0–0.2)

## 2023-03-23 LAB — CULTURE, BLOOD (ROUTINE X 2): Special Requests: ADEQUATE

## 2023-03-23 LAB — GLUCOSE, CAPILLARY
Glucose-Capillary: 119 mg/dL — ABNORMAL HIGH (ref 70–99)
Glucose-Capillary: 124 mg/dL — ABNORMAL HIGH (ref 70–99)
Glucose-Capillary: 148 mg/dL — ABNORMAL HIGH (ref 70–99)
Glucose-Capillary: 140 mg/dL — ABNORMAL HIGH (ref 70–99)

## 2023-03-23 LAB — LACTIC ACID, PLASMA: Lactic Acid, Venous: 1.2 mmol/L (ref 0.5–1.9)

## 2023-03-23 LAB — PROCALCITONIN: Procalcitonin: 0.64 ng/mL

## 2023-03-23 MED ORDER — SODIUM CHLORIDE 0.9 % IV SOLN: INTRAVENOUS | Status: DC

## 2023-03-23 MED ORDER — TRAZODONE HCL 50 MG PO TABS
100.0000 mg | ORAL_TABLET | Freq: Every day | ORAL | Status: DC
  Administered 2023-03-23: 100 mg via ORAL
  Filled 2023-03-23: qty 2

## 2023-03-23 MED ORDER — ALPRAZOLAM 0.5 MG PO TABS
0.5000 mg | ORAL_TABLET | Freq: Once | ORAL | Status: AC
  Administered 2023-03-23: 0.5 mg via ORAL
  Filled 2023-03-23: qty 1

## 2023-03-23 MED ORDER — SODIUM BICARBONATE 650 MG PO TABS
650.0000 mg | ORAL_TABLET | Freq: Two times a day (BID) | ORAL | Status: AC
  Administered 2023-03-23 – 2023-03-25 (×6): 650 mg via ORAL
  Filled 2023-03-23 (×6): qty 1

## 2023-03-23 NOTE — Plan of Care (Signed)
  Problem: Acute Rehab PT Goals(only PT should resolve) Goal: Pt Will Go Supine/Side To Sit Outcome: Progressing Flowsheets (Taken 03/23/2023 1349) Pt will go Supine/Side to Sit: with min guard assist Goal: Pt Will Go Sit To Supine/Side Outcome: Progressing Flowsheets (Taken 03/23/2023 1349) Pt will go Sit to Supine/Side: with min guard assist Goal: Patient Will Transfer Sit To/From Stand Outcome: Progressing Flowsheets (Taken 03/23/2023 1349) Patient will transfer sit to/from stand:  with min guard assist  with cues (comment type and amount) Note: For step sequencing and hand placement; least restrictive assistive device  Goal: Pt Will Transfer Bed To Chair/Chair To Bed Outcome: Progressing Flowsheets (Taken 03/23/2023 1349) Pt will Transfer Bed to Chair/Chair to Bed:  min guard assist  with supervision Note: Least restrictive assistive device  Goal: Pt Will Ambulate Outcome: Progressing Flowsheets (Taken 03/23/2023 1349) Pt will Ambulate:  100 feet  with min guard assist  with least restrictive assistive device Goal: Pt Will Go Up/Down Stairs Outcome: Progressing Flowsheets (Taken 03/23/2023 1349) Pt will Go Up / Down Stairs:  6-9 stairs  with min guard assist  with rail(s)  with least restrictive assistive device   Britta Mccreedy D. Hartnett-Rands, MS, PT Per Diem PT [facility] [phone] 03/23/2023

## 2023-03-23 NOTE — Evaluation (Signed)
Physical Therapy Evaluation Patient Details Name: Kimberly [name] MRN: [id number] DOB: 02-22-1951 Today's Date: 03/23/2023  History of Present Illness  Kimberly [name] is a 72 y.o. female with medical history significant of type 2 diabetes with nephropathy, depression, history of breast cancer (status post bilateral mastectomy), gastroesophageal flux disease, class I obesity, and hypothyroidism; who presented to the hospital secondary to temperature and altered mental status.  Symptoms have been present approximately since Friday (03/16/23) and worsening. Patient with recent UTI treated with Bactrim as an outpatient; however after finishing antibiotic has developed nausea vomiting and decreased oral intake.  Patient's daughter noticed some associated weight loss and signs of dehydration.  Patient expressed continue taking her meds despite no eating and drinking well.  Has not been any abdominal pain, hematuria, melena, hematochezia, focal weaknesses, chest pain, shortness of breath, coughing spells or sick contacts.  Daughter reported son fluctuating low-grade temperature for what the mom has been using Tylenol at home.   Clinical Impression  Pt admitted with above diagnosis. Patient walking with RW and daughter, Okey Dupre, in hallways when therapist arrived. Daughter using min guard for safety. Patient completed bed mobility with supervision to min assist. Patient requires cues for step sequencing and hand placement when using RW. Patient ambulated 60 feet using RW. Patient then ambulated from chair to bed without assistive device and supervision to min guard for safety. Daughter reports patient's cognition is clearing day by day but she is still confused, effecting her processing and mobility. Daughter assisted with subjective history and prior level of functioning. Pt currently with functional limitations due to the deficits listed below (see PT Problem List). Pt will benefit from acute skilled PT to  increase their independence and safety with mobility to allow discharge.           Assistance Recommended at Discharge Intermittent Supervision/Assistance  If plan is discharge home, recommend the following:  Can travel by private vehicle  A little help with walking and/or transfers;A little help with bathing/dressing/bathroom;Help with stairs or ramp for entrance;Assist for transportation;Assistance with cooking/housework;Direct supervision/assist for medications management        Equipment Recommendations None recommended by PT  Recommendations for Other Services       Functional Status Assessment Patient has had a recent decline in their functional status and demonstrates the ability to make significant improvements in function in a reasonable and predictable amount of time.     Precautions / Restrictions Precautions Precautions: Fall Precaution Comments: patient reports a fall May 2024 associated with infection and delirium Restrictions Weight Bearing Restrictions: No      Mobility  Bed Mobility Overal bed mobility: Needs Assistance Bed Mobility: Supine to Sit, Sit to Supine     Supine to sit: Min assist Sit to supine: Min guard, Supervision   General bed mobility comments: min assist for trunk; increased time and bedrail use for sit to supine    Transfers Overall transfer level: Needs assistance Equipment used: Rolling walker (2 wheels) Transfers: Sit to/from Stand Sit to Stand: Min guard           General transfer comment: cues for step sequencing and hand placement    Ambulation/Gait Ambulation/Gait assistance: Min guard, Min assist Gait Distance (Feet): 60 Feet Assistive device: Rolling walker (2 wheels) Gait Pattern/deviations: Step-through pattern, Decreased step length - right, Decreased step length - left, Decreased stride length, Wide base of support Gait velocity: decreased     General Gait Details: slow, somewhat labored cadence with RW  using fairly equal length steps, a wide base of support; cues to walk within the RW base of support; short distance ambulation from chair to bed without assistive device with supervision to min guard for safety; no losses of balance; on room air throughout  Stairs      Wheelchair Mobility     Tilt Bed    Modified Rankin (Stroke Patients Only)       Balance Overall balance assessment: Needs assistance Sitting-balance support: No upper extremity supported, Feet supported Sitting balance-Leahy Scale: Good     Standing balance support: No upper extremity supported, During functional activity Standing balance-Leahy Scale: Fair Standing balance comment: fair/good with RW         Pertinent Vitals/Pain Pain Assessment Pain Assessment: PAINAD Breathing: normal Negative Vocalization: none Facial Expression: smiling or inexpressive Body Language: relaxed Consolability: no need to console PAINAD Score: 0    Home Living Family/patient expects to be discharged to:: Private residence Living Arrangements: Spouse/significant other Available Help at Discharge: Family;Available 24 hours/day Type of Home: House Home Access: Stairs to enter Entrance Stairs-Rails: Right;Left;Can reach both Entrance Stairs-Number of Steps: 10   Home Layout: One level Home Equipment: Cane - single point;Cane - quad;Hand held shower head      Prior Function Prior Level of Function : Independent/Modified Independent;Driving         Hand Dominance        Extremity/Trunk Assessment   Upper Extremity Assessment Upper Extremity Assessment: Overall WFL for tasks assessed    Lower Extremity Assessment Lower Extremity Assessment: Overall WFL for tasks assessed    Cervical / Trunk Assessment Cervical / Trunk Assessment: Normal  Communication   Communication: Expressive difficulties;Receptive difficulties  Cognition Arousal/Alertness: Awake/alert Behavior During Therapy: WFL for tasks  assessed/performed Overall Cognitive Status: Impaired/Different from baseline Area of Impairment: Memory, Attention        General Comments      Exercises     Assessment/Plan    PT Assessment Patient needs continued PT services  PT Problem List Decreased cognition;Decreased knowledge of use of DME;Decreased activity tolerance;Decreased balance;Decreased mobility       PT Treatment Interventions DME instruction;Balance training;Gait training;Stair training;Cognitive remediation;Functional mobility training;Patient/family education;Therapeutic activities;Therapeutic exercise    PT Goals (Current goals can be found in the Care Plan section)  Acute Rehab PT Goals Patient Stated Goal: Go home with family to help PT Goal Formulation: With patient/family Time For Goal Achievement: 04/06/23 Potential to Achieve Goals: Good    Frequency Min 3X/week        AM-PAC PT "6 Clicks" Mobility  Outcome Measure Help needed turning from your back to your side while in a flat bed without using bedrails?: None Help needed moving from lying on your back to sitting on the side of a flat bed without using bedrails?: A Little Help needed moving to and from a bed to a chair (including a wheelchair)?: A Little Help needed standing up from a chair using your arms (e.g., wheelchair or bedside chair)?: A Little Help needed to walk in hospital room?: A Little Help needed climbing 3-5 steps with a railing? : A Little 6 Click Score: 19    End of Session   Activity Tolerance: Patient tolerated treatment well;Patient limited by fatigue Patient left: in bed;with family/visitor present;with nursing/sitter in room;with call bell/phone within reach;with bed alarm set Nurse Communication: Mobility status PT Visit Diagnosis: Unsteadiness on feet (R26.81);History of falling (Z91.81);Other abnormalities of gait and mobility (R26.89)    Time: 1310-1335 PT  Time Calculation (min) (ACUTE ONLY): 25  min   Charges:   PT Evaluation $PT Eval Low Complexity: 1 Low PT Treatments $Therapeutic Activity: 8-22 mins PT General Charges $$ ACUTE PT VISIT: 1 Visit         Katina Dung. Hartnett-Rands, MS, PT Per Diem PT [facility] [place] [phone]  Isatu  Hartnett-Rands 03/23/2023, 1:45 PM

## 2023-03-23 NOTE — Care Management Important Message (Signed)
Important Message  Patient Details  Name: Kimberly [name] MRN: [id number] Date of Birth: 1951-07-31   Medicare Important Message Given:  Yes     Corey Harold 03/23/2023, 11:10 AM

## 2023-03-23 NOTE — Plan of Care (Signed)
  Problem: Education: Goal: Ability to describe self-care measures that may prevent or decrease complications (Diabetes Survival Skills Education) will improve Outcome: Progressing Goal: Individualized Educational Video(s) Outcome: Progressing   

## 2023-03-23 NOTE — Progress Notes (Signed)
Pt alert and oriented x 4, answered all questions appropriately throughout the night. Pt given PRN Tylenol for reported 10/10 pain during the night. BP soft this a.m.

## 2023-03-23 NOTE — Progress Notes (Signed)
Progress Note   Patient: Kimberly [name] NWG:[id number] DOB: [birth year] DOA: 03/19/2023     3 DOS: the patient was seen and examined on 03/23/2023   Brief hospital admission narrative: Kimberly [name] is a 72 y.o. female with medical history significant of type 2 diabetes with nephropathy, depression, history of breast cancer (status post bilateral mastectomy), gastroesophageal flux disease, class I obesity, and hypothyroidism; who presented to the hospital secondary to temperature and altered mental status.  Symptoms have been present approximately since Friday (03/16/23) and worsening. Patient with recent UTI treated with Bactrim as an outpatient; however after finishing antibiotic has developed nausea vomiting and decreased oral intake.  Patient's daughter noticed some associated weight loss and signs of dehydration.  Patient expressed continue taking her meds despite no eating and drinking well. Has not been any abdominal pain, hematuria, melena, hematochezia, focal weaknesses, chest pain, shortness of breath, coughing spells or sick contacts. Daughter reported son fluctuating low-grade temperature for what the mom has been using Tylenol at home.   In the ED no fever appreciated; patient found to be dehydrated with acute kidney injury on chronic renal failure and hyponatremia (sodium 123); PCR negative no acute intracranial normalities on CT head; chest x-ray also negative for acute process.  Patient with normal WBCs.  Urinalysis not demonstrating acute infection.     TRH has been consulted to place patient in the hospital for further evaluation and management status.  To provide treatment for hyponatremia and acute kidney injury.    Assessment and Plan: 1)Acute metabolic encephalopathy--multifactorial -Patient with fevers, dehydration, hyponatremia  -Continue to minimize sedative agents and provide constant reorientation. -TSH and B12 WNL -CT head without acute intracranial  abnormalities. -Blood cultures from 03/19/2023 and repeat blood cultures from 03/21/2023 NGTD -Urine culture from 03/20/2023 with staph Epi 03/23/23- -mentation improving over the last 24 hours significant improvement  2)Persistent Fevers-- -Blood and urine cultures as above #1 -COVID test negative on 03/19/2023, repeat COVID test negative again on 03/21/2023 -She was treated with Bactrim for 1 week prior to admission for E. coli UTI based on sensitivities as per  urine culture from 03/09/2023 -Repeat urine culture from 03/20/2023 with staph epi, elevated procalcitonin noted -No leukocytosis -Patient received IV Rocephin on 03/22/2023 -Okay to treat with Cipro starting 03/22/2023 -Chest x-ray on 03/22/2023 without acute findings 03/23/23 -Clinically continues to improve, especially from a mentation standpoint --No further fevers  3)Hypophosphatemia----due to poor oral intake  -replace  Class 1 obesity -Body mass index is 31.51 kg/m. -Low-calorie diet and portion control discussed with patient.  Hyponatremia --On admission sodium was 123 -In the setting of decreased oral intake and continued use of diuretics PTA -Sodium improved with discontinuation of hydrochlorothiazide   Mixed hyperlipidemia -continue to follow lipid panel as an outpatient -continue low fat/heart healthy diet  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) -A1c 6.4 -Continue to hold oral hypoglycemic agents while inpatient -Continue sliding scale insulin and the use of Semglee.  Breast cancer of upper-outer quadrant of right female breast (HCC) -Continue outpatient follow-up with oncology service -Continue the use of Arimidex.  Nonalcoholic steatohepatitis (NASH) -Stable LFTs -Continue outpatient follow-up with gastroenterology service.  Depression -No suicidal ideation or hallucinations -Continue home antihypertensive agents.  Hypothyroidism -Continue Synthroid.  Essential hypertension,  benign -Continue holding HCTZ and losartan -Continue metoprolol -Avoid hypotension.    Subjective:  -Daughter Rosie at bedside -Mentation improving -Appetite is not great -Voiding without dysuria or hematuria  Physical Exam: Vitals:   03/22/23 1319  03/22/23 2153 03/23/23 0421 03/23/23 1336  BP: 99/64 107/75 (!) 109/50 (!) 108/54  Pulse:  80 77 77  Resp: 19 20 16    Temp: 99.2 F (37.3 C) 99.2 F (37.3 C) 98.8 F (37.1 C) 98.4 F (36.9 C)  TempSrc: Oral Oral  Oral  SpO2: 98% 100% 97% 98%  Weight:      Height:        Physical Exam Gen:- Awake , more interactive , appears comfortable HEENT:- [place].AT, No sclera icterus Neck-Supple Neck,No JVD,.  Lungs-  CTAB , fair air movement bilaterally  CV- S1, S2 normal, RRR Abd-  +ve B.Sounds, Abd Soft, No tenderness, no CVA area tenderness    Extremity/Skin:- No  edema,   good pedal pulses  Psych-appropriate affect, oriented x 3, cooperative, mentation has improved significantly neuro-generalized weakness, no new focal deficits, no tremors  Family Communication: Husband and daughter Okey Dupre updated  Disposition: To be determined may need SNF rehab Status is: Inpatient  Author: Shon Hale, MD 03/23/2023 7:30 PM  For on call review [email or web address].

## 2023-03-23 NOTE — Progress Notes (Incomplete)
Progress Note  RN called due to patient's worsening altered mental status.  She states that mental status has worsened since onset of shift.  At bedside, patient was noted to be febrile with a temperature of 102.50F, Tylenol was given Labs:    Latest Ref Rng & Units 03/23/2023   10:08 PM 03/22/2023    8:39 AM 03/20/2023    4:12 AM  CBC  WBC 4.0 - 10.5 K/uL 3.4  4.9  6.9   Hemoglobin 12.0 - 15.0 g/dL 9.3  9.6  40.9   Hematocrit 36.0 - 46.0 % 26.6  28.2  34.7   Platelets 150 - 400 K/uL 132  120  153       Latest Ref Rng & Units 03/23/2023   10:08 PM 03/23/2023    4:30 AM 03/22/2023    8:39 AM  BMP  Glucose 70 - 99 mg/dL 811  914  782   BUN 8 - 23 mg/dL 19  20  26    Creatinine 0.44 - 1.00 mg/dL 9.56  2.13  0.86   Sodium 135 - 145 mmol/L 125  125  126   Potassium 3.5 - 5.1 mmol/L 3.7  3.7  3.5   Chloride 98 - 111 mmol/L 100  100  100   CO2 22 - 32 mmol/L 16  17  19    Calcium 8.9 - 10.3 mg/dL 7.3  7.0  7.8    Procalcitonin 0.64 (this was 0.90 yesterday) Lactic acid was normal Urinalysis was normal (patient has had several days of antibiotics)    BP (!) 150/67   Pulse 94   Temp (!) 102.3 F (39.1 C) (Oral)   Resp (!) 24   Ht 5\' 3"  (1.6 m)   Wt 80.7 kg   SpO2 95%   BMI 31.51 kg/m   Mental status appeared to have improved slightly since arrival to bedside Mucous membrane was dry.  IV hydration will be provided

## 2023-03-23 NOTE — Progress Notes (Signed)
Progress Note   RN called due to patient's worsening altered mental status.  She states that mental status has worsened since onset of shift.  At bedside, patient was noted to be febrile with a temperature of 102.45F, Tylenol was given Labs:    Latest Ref Rng & Units 03/23/2023   10:08 PM 03/22/2023    8:39 AM 03/20/2023    4:12 AM  CBC  WBC 4.0 - 10.5 K/uL 3.4  4.9  6.9   Hemoglobin 12.0 - 15.0 g/dL 9.3  9.6  96.0   Hematocrit 36.0 - 46.0 % 26.6  28.2  34.7   Platelets 150 - 400 K/uL 132  120  153       Latest Ref Rng & Units 03/23/2023   10:08 PM 03/23/2023    4:30 AM 03/22/2023    8:39 AM  BMP  Glucose 70 - 99 mg/dL 454  098  119   BUN 8 - 23 mg/dL 19  20  26    Creatinine 0.44 - 1.00 mg/dL 1.47  8.29  5.62   Sodium 135 - 145 mmol/L 125  125  126   Potassium 3.5 - 5.1 mmol/L 3.7  3.7  3.5   Chloride 98 - 111 mmol/L 100  100  100   CO2 22 - 32 mmol/L 16  17  19    Calcium 8.9 - 10.3 mg/dL 7.3  7.0  7.8    Procalcitonin 0.64 (this was 0.90 yesterday) Lactic acid was normal Urinalysis was normal (patient has had several days of antibiotics)    BP (!) 150/67   Pulse 94   Temp (!) 102.3 F (39.1 C) (Oral)   Resp (!) 24   Ht 5\' 3"  (1.6 m)   Wt 80.7 kg   SpO2 95%   BMI 31.51 kg/m   Change in patient's mental status may be due to the acute febrile episode and this appeared to have improved slightly since arrival to bedside Mucous membrane was dry.  IV hydration will be provided Continue IV antibiotics per primary team at this time Continue to monitor patient and treat accordingly  Total time:  32 minutes This includes time reviewing the chart including progress notes, labs, EKGs, taking medical decisions, ordering labs and documenting findings.

## 2023-03-23 NOTE — TOC Progression Note (Addendum)
Transition of Care [facility]) - Progression Note    Patient Details  Name: Kimberly [name] MRN: [id number] Date of Birth: 09-02-1950  [facility]) CM/SW Contact  Karn Cassis, [place] Phone Number: 03/23/2023, 2:01 PM  Clinical Narrative: PT evaluated pt and recommend HHPT. Discussed with pt's daughter as pt not available. Rosa agreeable with no preference on agency. Referred and accepted by Morrie Sheldon with [facility]. Will need HHPT order.     Addendum: Noted BSC ordered. Discussed with daughter who asked pt and they do not feel this is needed.    Expected Discharge Plan: Home/Self Care Barriers to Discharge: Continued Medical Work up  Expected Discharge Plan and Services In-house Referral: Clinical Social Work     Living arrangements for the past 2 months: Single Family Home                           HH Arranged: PT HH Agency: [facility] Date HH Agency Contacted: 03/23/23 Time HH Agency Contacted: 1400 Representative spoke with at [facility] Agency: Morrie Sheldon   Social Determinants of Health (SDOH) Interventions SDOH Screenings   Food Insecurity: No Food Insecurity (03/19/2023)  Housing: Low Risk  (03/19/2023)  Transportation Needs: No Transportation Needs (03/19/2023)  Utilities: Not At Risk (03/19/2023)  Alcohol Screen: Low Risk  (02/16/2023)  Depression (PHQ2-9): Low Risk  (02/16/2023)  Recent Concern: Depression (PHQ2-9) - Medium Risk (01/29/2023)  Financial Resource Strain: Low Risk  (02/16/2023)  Physical Activity: Insufficiently Active (02/16/2023)  Social Connections: Socially Integrated (02/16/2023)  Stress: No Stress Concern Present (02/16/2023)  Tobacco Use: Low Risk  (03/19/2023)    Readmission Risk Interventions    03/21/2023    7:45 AM  Readmission Risk Prevention Plan  Transportation Screening Complete  HRI or Home Care Consult Complete  Social Work Consult for Recovery Care Planning/Counseling Complete  Palliative Care Screening Not  Applicable  Medication Review Oceanographer) Complete

## 2023-03-24 ENCOUNTER — Inpatient Hospital Stay (HOSPITAL_COMMUNITY): Payer: Medicare Other

## 2023-03-24 DIAGNOSIS — G9341 Metabolic encephalopathy: Secondary | ICD-10-CM | POA: Diagnosis not present

## 2023-03-24 LAB — GLUCOSE, CAPILLARY
Glucose-Capillary: 121 mg/dL — ABNORMAL HIGH (ref 70–99)
Glucose-Capillary: 130 mg/dL — ABNORMAL HIGH (ref 70–99)
Glucose-Capillary: 146 mg/dL — ABNORMAL HIGH (ref 70–99)
Glucose-Capillary: 147 mg/dL — ABNORMAL HIGH (ref 70–99)
Glucose-Capillary: 153 mg/dL — ABNORMAL HIGH (ref 70–99)

## 2023-03-24 LAB — CBC WITH DIFFERENTIAL/PLATELET
Abs Immature Granulocytes: 0 10*3/uL (ref 0.00–0.07)
Basophils Absolute: 0 10*3/uL (ref 0.0–0.1)
Basophils Relative: 0 %
Eosinophils Absolute: 0.1 10*3/uL (ref 0.0–0.5)
Eosinophils Relative: 3 %
HCT: 26 % — ABNORMAL LOW (ref 36.0–46.0)
Hemoglobin: 8.9 g/dL — ABNORMAL LOW (ref 12.0–15.0)
Lymphocytes Relative: 15 %
Lymphs Abs: 0.5 10*3/uL — ABNORMAL LOW (ref 0.7–4.0)
MCH: 31.3 pg (ref 26.0–34.0)
MCHC: 34.2 g/dL (ref 30.0–36.0)
MCV: 91.5 fL (ref 80.0–100.0)
Monocytes Absolute: 0.1 10*3/uL (ref 0.1–1.0)
Monocytes Relative: 4 %
Neutro Abs: 2.3 10*3/uL (ref 1.7–7.7)
Neutrophils Relative %: 78 %
Platelets: 131 10*3/uL — ABNORMAL LOW (ref 150–400)
RBC: 2.84 MIL/uL — ABNORMAL LOW (ref 3.87–5.11)
RDW: 13.5 % (ref 11.5–15.5)
WBC: 3 10*3/uL — ABNORMAL LOW (ref 4.0–10.5)
nRBC: 0 % (ref 0.0–0.2)

## 2023-03-24 LAB — COMPREHENSIVE METABOLIC PANEL
ALT: 29 U/L (ref 0–44)
AST: 40 U/L (ref 15–41)
Albumin: 2.3 g/dL — ABNORMAL LOW (ref 3.5–5.0)
Alkaline Phosphatase: 59 U/L (ref 38–126)
Anion gap: 6 (ref 5–15)
BUN: 15 mg/dL (ref 8–23)
CO2: 19 mmol/L — ABNORMAL LOW (ref 22–32)
Calcium: 7.1 mg/dL — ABNORMAL LOW (ref 8.9–10.3)
Chloride: 101 mmol/L (ref 98–111)
Creatinine, Ser: 0.9 mg/dL (ref 0.44–1.00)
GFR, Estimated: >60 mL/min (ref >60)
Glucose, Bld: 140 mg/dL — ABNORMAL HIGH (ref 70–99)
Potassium: 3.3 mmol/L — ABNORMAL LOW (ref 3.5–5.1)
Sodium: 126 mmol/L — ABNORMAL LOW (ref 135–145)
Total Bilirubin: 1.2 mg/dL (ref 0.3–1.2)
Total Protein: 5.5 g/dL — ABNORMAL LOW (ref 6.5–8.1)

## 2023-03-24 LAB — HIV ANTIBODY (ROUTINE TESTING W REFLEX): HIV Screen 4th Generation wRfx: NONREACTIVE

## 2023-03-24 MED ORDER — POTASSIUM CHLORIDE CRYS ER 20 MEQ PO TBCR
40.0000 meq | EXTENDED_RELEASE_TABLET | ORAL | Status: AC
  Administered 2023-03-24 (×2): 40 meq via ORAL
  Filled 2023-03-24 (×2): qty 2

## 2023-03-24 MED ORDER — DEXTROSE 5 % IV SOLN
10.0000 mg/kg | Freq: Three times a day (TID) | INTRAVENOUS | Status: DC
  Administered 2023-03-24 – 2023-03-25 (×4): 635 mg via INTRAVENOUS
  Filled 2023-03-24 (×7): qty 12.7

## 2023-03-24 MED ORDER — TRAZODONE HCL 50 MG PO TABS
100.0000 mg | ORAL_TABLET | Freq: Every evening | ORAL | Status: DC | PRN
  Administered 2023-03-24: 100 mg via ORAL
  Filled 2023-03-24: qty 2

## 2023-03-24 MED ORDER — SODIUM CHLORIDE 0.9 % IV SOLN: INTRAVENOUS | Status: DC

## 2023-03-24 MED ORDER — POTASSIUM CHLORIDE 10 MEQ/100ML IV SOLN
10.0000 meq | INTRAVENOUS | Status: AC
  Administered 2023-03-24 (×4): 10 meq via INTRAVENOUS
  Filled 2023-03-24 (×5): qty 100

## 2023-03-24 NOTE — Progress Notes (Signed)
Pharmacy Antibiotic Note  Kimberly [name] is a 72 y.o. female admitted on 03/19/2023 with possible HSV Encephalitis .  Pharmacy has been consulted for acyclovir dosing.  Plan: Acyclovir 635 mg (10 m/kg ABW) IV every 8 hours. Monitor labs, c/s, and patient improvement Hold off on starting vanc/ceftriaxone/ampicillin --until after lumbar puncture and CSF is obtained   Height: 5\' 3"  (160 cm) Weight: 80.7 kg (177 lb 14.4 oz) IBW/kg (Calculated) : 52.4  Temp (24hrs), Avg:100.1 F (37.8 C), Min:98.8 F (37.1 C), Max:102.3 F (39.1 C)  Recent Labs  Lab 03/19/23 1036 03/19/23 1216 03/20/23 0412 03/21/23 0344 03/22/23 0839 03/23/23 0430 03/23/23 2208 03/24/23 0128 03/24/23 0501 03/24/23 0854  WBC 7.7  --  6.9  --  4.9  --  3.4*  --  3.9* 3.0*  CREATININE 1.73*  --  1.22*   < > 1.14* 0.94 0.92 0.92 0.89 0.90  LATICACIDVEN 1.6 1.3  --   --   --   --  1.2 1.5  --   --    < > = values in this interval not displayed.    Estimated Creatinine Clearance: 57.7 mL/min (by C-G formula based on SCr of 0.9 mg/dL).    Allergies  Allergen Reactions   Ace Inhibitors Cough    Antimicrobials this admission: Acyclovir 7/27 >> Cipr 7/25 >> 7/27 CTX 7/25  Microbiology results: 7/27 CSF cx: pending 7/26 Bcx: ngtd 7/22 BCx: ng 7/23 UCx: 20k staph epi  Respiratory panel: pending   Thank you for allowing pharmacy to be a part of this patient's care.  Judeth Cornfield, PharmD Clinical Pharmacist 03/24/2023 4:02 PM

## 2023-03-24 NOTE — Progress Notes (Signed)
Discussed with dr Courage  Patient 72 yo female coming in for febrile encephalopathy 1-2 weeks prior has had uti abx No diarrhea illness, rash   Transfering to [facility] for lp  Urine staph epi <20k colonies is a contaminant/colonizer, not pathogenic 7/26 bcx ngtd    Lab Results  Component Value Date   WBC 3.0 (L) 03/24/2023   HGB 8.9 (L) 03/24/2023   HCT 26.0 (L) 03/24/2023   MCV 91.5 03/24/2023   PLT 131 (L) 03/24/2023   Last metabolic panel Lab Results  Component Value Date   GLUCOSE 140 (H) 03/24/2023   NA 126 (L) 03/24/2023   K 3.3 (L) 03/24/2023   CL 101 03/24/2023   CO2 19 (L) 03/24/2023   BUN 15 03/24/2023   CREATININE 0.90 03/24/2023   GFRNONAA >60 03/24/2023   CALCIUM 7.1 (L) 03/24/2023   PHOS 2.7 03/23/2023   PROT 5.5 (L) 03/24/2023   ALBUMIN 2.3 (L) 03/24/2023   LABGLOB 3.2 10/03/2021   AGRATIO 1.3 10/03/2021   BILITOT 1.2 03/24/2023   ALKPHOS 59 03/24/2023   AST 40 03/24/2023   ALT 29 03/24/2023   ANIONGAP 6 03/24/2023      Start acyclovir/empiric abx. Do not want to miss partial treated bacterial meningitis (bactrim course finish aobut a week prior and 2 days cipro here) Lp with below  1) cell count, diff, glucose, protein, culture 2) meningoencephalitis pcr 3) csf arbovirus serology 4) serum arbovirus serology 5) I dont' see suggestion of tick related illness 6) acyclovir, vanc/ceftriaxone/ampicillin 7) IR was consulted for LP -- secure chat message with labs orders placed    I will see once patient here here at [facility]

## 2023-03-24 NOTE — Progress Notes (Signed)
Progress Note  Patient: Kimberly [name] DOB: [birth year] DOA: 03/19/2023     4 DOS: the patient was seen and examined on 03/24/2023   Brief hospital admission narrative: Kimberly [name] is a 72 y.o. female with medical history significant of type 2 diabetes with nephropathy, depression, history of breast cancer (status post bilateral mastectomy), gastroesophageal flux disease, class I obesity, and hypothyroidism; who presented to the hospital secondary to temperature and altered mental status.  Symptoms have been present approximately since Friday (03/16/23) and worsening. Patient with recent UTI treated with Bactrim as an outpatient; however after finishing antibiotic has developed nausea vomiting and decreased oral intake.  -Admitted on 03/19/2023 with concerns for hyponatremia/dehydration and AKI with altered mentation -She has continued to have altered mentation and fevers similar to her presentation back in May 2024 -Transferring on 03/24/2023 to [facility] for possible lumbar puncture and further infectious disease evaluation (Dr. Rutha Bouchard)  Assessment and Plan: 1)Acute metabolic encephalopathy--multifactorial -Patient with fevers, dehydration, hyponatremia  -Continue to minimize sedative agents and provide constant reorientation. -TSH and B12 WNL -CT head without acute intracranial abnormalities. -Blood cultures from 03/19/2023 and repeat blood cultures from 03/21/2023 as well as repeat blood cultures from 03/23/2023 and 03/24/2023 NGTD -Urine culture from 03/20/2023 with staph Epi (presumed contaminant 03/24/23- -mentation worsened again over last 24 hours with Fevers -Repeat CT head without acute findings -Patient will need MRI of the brain -Lumbar puncture CSF studies--- please see lab orders (Discussed with Kimberly Edwyna Ready NP)  -Official ID consult from Dr Rutha Bouchard pending -Start IV acyclovir per pharmacy on 03/24/23 -Hold off on starting  vanc/ceftriaxone/ampicillin --until after lumbar puncture and CSF is obtained  2)Persistent Fevers-- -Blood and urine cultures as above #1 -COVID test negative on 03/19/2023, repeat COVID test negative again on 03/21/2023 -She was treated with Bactrim for 1 week prior to admission for E. coli UTI based on sensitivities as per  urine culture from 03/09/2023 -Repeat urine culture from 03/20/2023 with staph epi (contaminant) -Patient received IV Rocephin on 03/22/2023 Patient received Cipro x 3 doses from 03/22/2023 to 03/23/2023 -Chest x-ray on 03/22/2023 without acute findings 03/24/23 --Tmax 102.3 T-current 98.9 -elevated procalcitonin noted -Leukopenia/pancytopenia noted -High clinical index of suspicion for viral infection including possible arbovirus encephalitis -Clinically meningitis much less likely -Autoimmune disorder less likely --Official ID consult from Dr Rutha Bouchard pending -Start IV acyclovir per pharmacy on 03/24/23 -Hold off on starting vanc/ceftriaxone/ampicillin --until after lumbar puncture and CSF is obtained  3)Hypophosphatemia/hyponatremia and hypokalemia----due to poor oral intake/dehydration and diuretic use prior to admission -Sodium was 123 on admission -HCTZ has been discontinued -Hydrate and replace electrolytes -Encourage adequate oral intake  Class 1 obesity -Body mass index is 31.51 kg/m. -Low-calorie diet and portion control discussed with patient.  Mixed hyperlipidemia -continue to follow lipid panel as an outpatient -continue low fat/heart healthy diet  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) -A1c 6.4 -Continue to hold oral hypoglycemic agents while inpatient -Continue sliding scale insulin and Semglee.  Breast cancer of upper-outer quadrant of right female breast (HCC)-status post right-sided mastectomy -Continue outpatient follow-up with oncology service -Continue  Arimidex.  Nonalcoholic steatohepatitis (NASH) -Stable LFTs -Continue  outpatient follow-up with gastroenterology service.  Depression -No suicidal ideation or hallucinations -Continue home antihypertensive agents.  Hypothyroidism -Continue Synthroid.  Essential hypertension, benign - holding HCTZ and losartan -Continue metoprolol -Avoid hypotension.    Subjective:  -Daughter and husband at bedside -Tmax 102.3 -T-current 98.9  No Nausea, Vomiting or  Diarrhea -No rash -Had increased confusional episodes overnight Appetite is poor   Physical Exam: Vitals:   03/24/23 0453 03/24/23 0656 03/24/23 0849 03/24/23 1353  BP: (!) 140/68  (!) 126/56 (!) 123/50  Pulse: 96  85 79  Resp:    18  Temp: (!) 101.6 F (38.7 C) 99.2 F (37.3 C) 98.8 F (37.1 C) 98.9 F (37.2 C)  TempSrc: Oral Oral Oral   SpO2: 96%   96%  Weight:      Height:       Temp:  [98.8 F (37.1 C)-102.3 F (39.1 C)] 98.9 F (37.2 C) (07/27 1353) Pulse Rate:  [79-96] 79 (07/27 1353) Resp:  [18-24] 18 (07/27 1353) BP: (123-150)/(50-68) 123/50 (07/27 1353) SpO2:  [95 %-98 %] 96 % (07/27 1353)   Physical Exam Gen:-Sleepy but easily arousable, no acute distress, follows instructions HEENT:- [place].AT, No sclera icterus Neck-Supple Neck,No JVD,.  Lungs-  CTAB , fair air movement bilaterally  Chest wall-healed right-sided mastectomy CV- S1, S2 normal, RRR Abd-  +ve B.Sounds, Abd Soft, No tenderness, no CVA area tenderness , increased truncal adiposity Extremity/Skin:- No  edema,   good pedal pulses , Skin exam without lesions, open wounds or sores or bite marks, no rash Psych-memory and cognitive deficits, disorientation, confusion altered mentation noted, cooperative, no agitation neuro-generalized weakness, no new focal deficits, no tremors  Family Communication: Husband and daughter Kimberly [name] updated at bedside  Disposition: To be determined may need SNF rehab--not medically [name] for discharge at this time Status is: Inpatient  Author: Shon Hale, MD 03/24/2023 3:29  PM  For on call review [email or web address].

## 2023-03-24 NOTE — Plan of Care (Signed)
  Problem: Education: Goal: Ability to describe self-care measures that may prevent or decrease complications (Diabetes Survival Skills Education) will improve Outcome: Progressing Goal: Individualized Educational Video(s) Outcome: Progressing   

## 2023-03-24 NOTE — Progress Notes (Addendum)
   03/24/23 0453  Vitals  Temp (!) 101.6 F (38.7 C)  Temp Source Oral  BP (!) 140/68  MAP (mmHg) 89  BP Location Left Arm  BP Method Automatic  Patient Position (if appropriate) Lying  Pulse Rate 96  Pulse Rate Source Dinamap  MEWS COLOR  MEWS Score Color Yellow  Oxygen Therapy  SpO2 96 %  O2 Device Room Air  MEWS Score  MEWS Temp 2  MEWS Systolic 0  MEWS Pulse 0  MEWS RR 0  MEWS LOC 0  MEWS Score 2     Patient unable to stay awake long enough to take medication. Not safe to administer PO meds at this time. Unable to give PO Tylenol, will give by suppository. MD Thomes Dinning made aware.

## 2023-03-25 ENCOUNTER — Inpatient Hospital Stay (HOSPITAL_COMMUNITY): Payer: Medicare Other

## 2023-03-25 DIAGNOSIS — G9341 Metabolic encephalopathy: Secondary | ICD-10-CM | POA: Diagnosis not present

## 2023-03-25 LAB — CBC WITH DIFFERENTIAL/PLATELET
Abs Immature Granulocytes: 0.01 10*3/uL (ref 0.00–0.07)
Basophils Absolute: 0 10*3/uL (ref 0.0–0.1)
Basophils Relative: 1 %
Eosinophils Absolute: 0.2 10*3/uL (ref 0.0–0.5)
Eosinophils Relative: 6 %
HCT: 25.9 % — ABNORMAL LOW (ref 36.0–46.0)
Hemoglobin: 8.6 g/dL — ABNORMAL LOW (ref 12.0–15.0)
Immature Granulocytes: 0 %
Lymphocytes Relative: 50 %
Lymphs Abs: 1.6 10*3/uL (ref 0.7–4.0)
MCH: 30.9 pg (ref 26.0–34.0)
MCHC: 33.2 g/dL (ref 30.0–36.0)
MCV: 93.2 fL (ref 80.0–100.0)
Monocytes Absolute: 0.4 10*3/uL (ref 0.1–1.0)
Monocytes Relative: 11 %
Neutro Abs: 1 10*3/uL — ABNORMAL LOW (ref 1.7–7.7)
Neutrophils Relative %: 32 %
Platelets: 157 10*3/uL (ref 150–400)
RBC: 2.78 MIL/uL — ABNORMAL LOW (ref 3.87–5.11)
RDW: 13.8 % (ref 11.5–15.5)
Smear Review: NORMAL
WBC: 3.2 10*3/uL — ABNORMAL LOW (ref 4.0–10.5)
nRBC: 0 % (ref 0.0–0.2)

## 2023-03-25 LAB — MENINGITIS/ENCEPHALITIS PANEL (CSF)

## 2023-03-25 LAB — RESPIRATORY PANEL BY PCR

## 2023-03-25 LAB — COMPREHENSIVE METABOLIC PANEL
ALT: 46 U/L — ABNORMAL HIGH (ref 0–44)
AST: 66 U/L — ABNORMAL HIGH (ref 15–41)
Albumin: 2.2 g/dL — ABNORMAL LOW (ref 3.5–5.0)
Alkaline Phosphatase: 63 U/L (ref 38–126)
Anion gap: 8 (ref 5–15)
BUN: 11 mg/dL (ref 8–23)
CO2: 18 mmol/L — ABNORMAL LOW (ref 22–32)
Calcium: 7.5 mg/dL — ABNORMAL LOW (ref 8.9–10.3)
Chloride: 104 mmol/L (ref 98–111)
Creatinine, Ser: 0.91 mg/dL (ref 0.44–1.00)
GFR, Estimated: >60 mL/min (ref >60)
Glucose, Bld: 133 mg/dL — ABNORMAL HIGH (ref 70–99)
Potassium: 4.5 mmol/L (ref 3.5–5.1)
Sodium: 130 mmol/L — ABNORMAL LOW (ref 135–145)
Total Bilirubin: 0.5 mg/dL (ref 0.3–1.2)
Total Protein: 5.9 g/dL — ABNORMAL LOW (ref 6.5–8.1)

## 2023-03-25 LAB — HIV ANTIBODY (ROUTINE TESTING W REFLEX): HIV Screen 4th Generation wRfx: NONREACTIVE

## 2023-03-25 LAB — GLUCOSE, CAPILLARY
Glucose-Capillary: 117 mg/dL — ABNORMAL HIGH (ref 70–99)
Glucose-Capillary: 133 mg/dL — ABNORMAL HIGH (ref 70–99)
Glucose-Capillary: 100 mg/dL — ABNORMAL HIGH (ref 70–99)
Glucose-Capillary: 127 mg/dL — ABNORMAL HIGH (ref 70–99)

## 2023-03-25 MED ORDER — GADOBUTROL 1 MMOL/ML IV SOLN
9.0000 mL | Freq: Once | INTRAVENOUS | Status: AC | PRN
  Administered 2023-03-25: 9 mL via INTRAVENOUS

## 2023-03-25 MED ORDER — LIDOCAINE 1 % OPTIME INJ - NO CHARGE
5.0000 mL | Freq: Once | INTRAMUSCULAR | Status: AC
  Administered 2023-03-25: 8 mL via INTRADERMAL
  Filled 2023-03-25: qty 6

## 2023-03-25 MED ORDER — LORAZEPAM 1 MG PO TABS
1.0000 mg | ORAL_TABLET | Freq: Once | ORAL | Status: AC | PRN
  Administered 2023-03-25: 1 mg via ORAL
  Filled 2023-03-25: qty 1

## 2023-03-25 NOTE — Consult Note (Signed)
[facility]    Date of Admission:  03/19/2023     Reason for Consult: febrile syndrome with ams    Referring Provider: Lacretia Nicks       Abx: 7/27-c acyclovir  7/25-27 cipro     7/12-22 bactrim  Assessment: 72 yo female dm2, ckd, depression, hx breast cancer s/p bilateral mastectomy, gerd, hypothryoidism admitted 7/22 with fever and confusion without clear cause  Patient had received abx bactrim for presumed uti 7/12 from urology. Ucx ecoli (appear to have had ecoli colonization of urinary tract0  Her wbc here is normal initially and now down trending along with mild thrombocytopenia. There is no gi sx/rash.  7/23 ucx 20k staph epi, not clinically significant, and not a uti pathogen 7/22, 7/24, 7/26, 7/27 bcx ngtd; 7/27 respiratory viral pcr negative  I suspect encephalitidies possibly arbovirus vs enterovirus. There is reactive lymphocytes/smudge cells on peripheral smear which could be suggestive of viral process  [place] farm but no livestocks (although she does have close by farms that do have livestock), very active with outside the house chores. She has a Engineer, petroleum. We will consider q-fever or psittacosis  She does have hx of breast cancer and previously in 12/2022 similar ams and reported uti sx. All cultures including lp and urine negative; csf without pleocytosis or other abnormality. Either improved with abx or self resolved. If no clear cause for this episode, will have to think about autoimmune/paraneoplastic process  Do not suspect tick related illness such as lyme or spotted fever process  Plan: Rpr, fta, hiv serology Csf arbovirus, me pcr, glucose/protein, culture, cell count Qfever serology, misc lab send out for chlamydia psittaci serology Brain mri Consider discussing case with neurology if ME panel pcr negative. She did have similar syndrome 12/2022 of unclear explanation and question if  these are related.  Discussed with primary team      ------------------------------------------------ Principal Problem:   Acute metabolic encephalopathy Active Problems:   Essential hypertension, benign   Hypothyroidism   Depression   Nonalcoholic steatohepatitis (NASH)   Breast cancer of upper-outer quadrant of right female breast (HCC)   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)   Mixed hyperlipidemia   Hyponatremia   Class 1 obesity    HPI: Kimberly [name] is a 72 y.o. female dm2, ckd, depression, hx breast cancer s/p bilateral mastectomy, gerd, hypothryoidism admitted 7/22 with fever and confusion without clear cause   Patient was recently treated for uti by urology about 2 weeks prior with bactrim for dysuria/urgency. This improved. However she continues to have malaise/poor appetite. A week prior to admission developed confusion. There is ongoing fever  Some nausea and an episode vomiting while on bactrim for uti  Reports initial headache that had resolved  No rash, joint pain, back pain, cough, chest pain, dypsnea, numbness/tingling, abd pain   No sick contact  A month ago was briefly in [place]   Lives on farm in [place]. Has pet Gaynelle Cage that is not sick, has chicken and ducks but no livestock. Nearby farm does have livestock    In may 2024 had similar uti sx and ams admission negative lp/blood cx and sx resolved   Breast cancer s/p right breast mastectomy and on hormone blocker anastrozole   Multiple Blood cx so far negative No leukocytosis but last 2 days mild leukopenia; brief mild thrombocytopenia resolved today 7/28 Lft initially normal  and today is elevated mildly Smudge cell and reactive lymphocytes on smear Respiratory viral pcr negative    Family History  Problem Relation Age of Onset   Diabetes Mother    Hypertension Mother    Congestive Heart Failure Father    Hypertension Father    Skin cancer Father 72        squamous cell on lip; metastatic   Hypertension Brother    Skin cancer Brother        squamous cell   Breast cancer Paternal Aunt        dx 40s d. 22s   Prostate cancer Paternal Uncle        dx 58s   Colon cancer Maternal Grandmother        dx 37s d. 24s   Diabetes Other    Cancer Cousin        unk type; possibly pancreatic    Social History   Tobacco Use   Smoking status: Never   Smokeless tobacco: Never  Vaping Use   Vaping status: Never Used  Substance Use Topics   Alcohol use: No   Drug use: No    Allergies  Allergen Reactions   Ace Inhibitors Cough    Review of Systems: ROS All Other ROS was negative, except mentioned above   Past Medical History:  Diagnosis Date   Anxiety    Arthritis    Depression    Diabetes mellitus    Family history of breast cancer    Family history of colon cancer    Family history of prostate cancer    Family history of skin cancer    GERD (gastroesophageal reflux disease)    High blood pressure    Hypothyroid    Obesity    PONV (postoperative nausea and vomiting)        Scheduled Meds:  anastrozole  1 mg Oral Daily   famotidine  20 mg Oral QHS   feeding supplement  237 mL Oral BID BM   gabapentin  100 mg Oral TID   heparin  5,000 Units Subcutaneous Q8H   insulin aspart  0-5 Units Subcutaneous QHS   insulin aspart  0-9 Units Subcutaneous TID WC   insulin glargine-yfgn  5 Units Subcutaneous QHS   levothyroxine  125 mcg Oral QAC breakfast   metoprolol succinate  100 mg Oral Daily   mirabegron ER  25 mg Oral QPM   multivitamin with minerals  1 tablet Oral Daily   pantoprazole  40 mg Oral Daily   sodium bicarbonate  650 mg Oral BID   venlafaxine XR  75 mg Oral QHS   Continuous Infusions:  sodium chloride Stopped (03/25/23 0737)   acyclovir 635 mg (03/25/23 1034)   PRN Meds:.acetaminophen **OR** acetaminophen, alum & mag hydroxide-simeth, ondansetron **OR** ondansetron (ZOFRAN) IV, mouth rinse,  traZODone   OBJECTIVE: Blood pressure (!) 156/64, pulse 72, temperature 97.8 F (36.6 C), temperature source Oral, resp. rate 16, height 5\' 3"  (1.6 m), weight 84.5 kg, SpO2 100%.  Physical Exam  General/constitutional: no distress, pleasant, confused, but cooperative; mildly lethargic HEENT: Normocephalic, PER, Conj Clear, EOMI, Oropharynx clear Neck supple CV: rrr no mrg Lungs: clear to auscultation, normal respiratory effort Abd: Soft, Nontender Ext: no edema Skin: No Rash Neuro: nonfocal outside of confusion; no myoclonus or muscle tremor MSK: no peripheral joint swelling/tenderness/warmth; back spines nontender     Lab Results Lab Results  Component Value Date   WBC 3.0 (L) 03/24/2023   HGB 8.9 (L)  03/24/2023   HCT 26.0 (L) 03/24/2023   MCV 91.5 03/24/2023   PLT 131 (L) 03/24/2023    Lab Results  Component Value Date   CREATININE 0.90 03/24/2023   BUN 15 03/24/2023   NA 126 (L) 03/24/2023   K 3.3 (L) 03/24/2023   CL 101 03/24/2023   CO2 19 (L) 03/24/2023    Lab Results  Component Value Date   ALT 29 03/24/2023   AST 40 03/24/2023   ALKPHOS 59 03/24/2023   BILITOT 1.2 03/24/2023      Microbiology: Recent Results (from the past 240 hour(s))  SARS Coronavirus 2 by RT PCR (hospital order, performed in [facility] hospital lab) *cepheid single result test* Anterior Nasal Swab     Status: None   Collection Time: 03/19/23 10:33 AM   Specimen: Anterior Nasal Swab  Result Value Ref Range Status   SARS Coronavirus 2 by RT PCR NEGATIVE NEGATIVE Final    Comment: (NOTE) SARS-CoV-2 target nucleic acids are NOT DETECTED.  The SARS-CoV-2 RNA is generally detectable in upper and lower respiratory specimens during the acute phase of infection. The lowest concentration of SARS-CoV-2 viral copies this assay can detect is 250 copies / mL. A negative result does not preclude SARS-CoV-2 infection and should not be used as the sole basis for treatment or other patient  management decisions.  A negative result may occur with improper specimen collection / handling, submission of specimen other than nasopharyngeal swab, presence of viral mutation(s) within the areas targeted by this assay, and inadequate number of viral copies (<250 copies / mL). A negative result must be combined with clinical observations, patient history, and epidemiological information.  Fact Sheet for Patients:   RoadLapTop.co.za  Fact Sheet for Healthcare Providers: http://kim-miller.com/  This test is not yet approved or  cleared by the [place] FDA and has been authorized for detection and/or diagnosis of SARS-CoV-2 by FDA under an Emergency Use Authorization (EUA).  This EUA will remain in effect (meaning this test can be used) for the duration of the COVID-19 declaration under Section 564(b)(1) of the Act, 21 U.S.C. section 360bbb-3(b)(1), unless the authorization is terminated or revoked sooner.  Performed at [facility], [address], [place], [place] [address]   Blood Culture (routine x 2)     Status: None   Collection Time: 03/19/23 10:36 AM   Specimen: BLOOD  Result Value Ref Range Status   Specimen Description BLOOD BLOOD LEFT ARM  Final   Special Requests   Final    BOTTLES DRAWN AEROBIC AND ANAEROBIC Blood Culture adequate volume   Culture   Final    NO GROWTH 5 DAYS Performed at [facility], [address], [place], [place] [address]    Report Status 03/24/2023 FINAL  Final  Blood Culture (routine x 2)     Status: None   Collection Time: 03/19/23 10:36 AM   Specimen: BLOOD  Result Value Ref Range Status   Specimen Description BLOOD BLOOD LEFT ARM  Final   Special Requests   Final    BOTTLES DRAWN AEROBIC AND ANAEROBIC Blood Culture adequate volume   Culture   Final    NO GROWTH 5 DAYS Performed at [facility], [address], [place], [place] [address]    Report Status 03/24/2023 FINAL  Final  Urine  Culture (for pregnant, neutropenic or urologic patients or patients with an indwelling urinary catheter)     Status: Abnormal   Collection Time: 03/20/23  4:50 PM  Specimen: Urine, Clean Catch  Result Value Ref Range Status   Specimen Description   Final    URINE, CLEAN CATCH Performed at [facility], [address], [place], [place] [address]    Special Requests   Final    NONE Performed at [facility], [address], [place], [place] [address]    Culture 20,000 COLONIES/mL STAPHYLOCOCCUS EPIDERMIDIS (A)  Final   Report Status 03/22/2023 FINAL  Final   Organism ID, Bacteria STAPHYLOCOCCUS EPIDERMIDIS (A)  Final      Susceptibility   Staphylococcus epidermidis - MIC*    CIPROFLOXACIN <=0.5 SENSITIVE Sensitive     GENTAMICIN <=0.5 SENSITIVE Sensitive     NITROFURANTOIN <=16 SENSITIVE Sensitive     OXACILLIN >=4 RESISTANT Resistant     TETRACYCLINE 2 SENSITIVE Sensitive     VANCOMYCIN 2 SENSITIVE Sensitive     TRIMETH/SULFA 80 RESISTANT Resistant     CLINDAMYCIN <=0.25 SENSITIVE Sensitive     RIFAMPIN <=0.5 SENSITIVE Sensitive     Inducible Clindamycin NEGATIVE Sensitive     * 20,000 COLONIES/mL STAPHYLOCOCCUS EPIDERMIDIS  Culture, blood (Routine X 2) w Reflex to ID Panel     Status: None (Preliminary result)   Collection Time: 03/21/23  1:34 PM   Specimen: BLOOD  Result Value Ref Range Status   Specimen Description BLOOD BLOOD LEFT HAND  Final   Special Requests   Final    BOTTLES DRAWN AEROBIC AND ANAEROBIC Blood Culture adequate volume   Culture   Final    NO GROWTH 4 DAYS Performed at [facility], [address] [place], [place] [address]    Report Status PENDING  Incomplete  SARS Coronavirus 2 by RT PCR (hospital order, performed in [facility] hospital lab) *cepheid single result test* Anterior Nasal Swab     Status: None   Collection Time: 03/21/23  1:35 PM   Specimen: Anterior Nasal Swab  Result Value Ref Range Status   SARS Coronavirus 2 by RT PCR NEGATIVE  NEGATIVE Final    Comment: (NOTE) SARS-CoV-2 target nucleic acids are NOT DETECTED.  The SARS-CoV-2 RNA is generally detectable in upper and lower respiratory specimens during the acute phase of infection. The lowest concentration of SARS-CoV-2 viral copies this assay can detect is 250 copies / mL. A negative result does not preclude SARS-CoV-2 infection and should not be used as the sole basis for treatment or other patient management decisions.  A negative result may occur with improper specimen collection / handling, submission of specimen other than nasopharyngeal swab, presence of viral mutation(s) within the areas targeted by this assay, and inadequate number of viral copies (<250 copies / mL). A negative result must be combined with clinical observations, patient history, and epidemiological information.  Fact Sheet for Patients:   RoadLapTop.co.za  Fact Sheet for Healthcare Providers: http://kim-miller.com/  This test is not yet approved or  cleared by the [place] FDA and has been authorized for detection and/or diagnosis of SARS-CoV-2 by FDA under an Emergency Use Authorization (EUA).  This EUA will remain in effect (meaning this test can be used) for the duration of the COVID-19 declaration under Section 564(b)(1) of the Act, 21 U.S.C. section 360bbb-3(b)(1), unless the authorization is terminated or revoked sooner.  Performed at [facility], [address] [place], [place] [address]   Culture, blood (Routine X 2) w Reflex to ID Panel     Status: None (Preliminary result)   Collection Time: 03/21/23  1:38 PM   Specimen: BLOOD  Result Value Ref Range Status   Specimen Description BLOOD BLOOD LEFT ARM  Final   Special Requests   Final    BOTTLES DRAWN AEROBIC AND ANAEROBIC Blood Culture adequate volume   Culture   Final    NO GROWTH 4 DAYS Performed at [facility], [address] [place], [place] [address]     Report Status PENDING  Incomplete  Culture, blood (Routine X 2) w Reflex to ID Panel     Status: None (Preliminary result)   Collection Time: 03/23/23 10:08 PM   Specimen: Left Antecubital; Blood  Result Value Ref Range Status   Specimen Description LEFT ANTECUBITAL  Final   Special Requests   Final    BOTTLES DRAWN AEROBIC AND ANAEROBIC Blood Culture results may not be optimal due to an excessive volume of blood received in culture bottles   Culture   Final    NO GROWTH 2 DAYS Performed at [facility], [address] [place], [place] [address]    Report Status PENDING  Incomplete  Culture, blood (Routine X 2) w Reflex to ID Panel     Status: None (Preliminary result)   Collection Time: 03/23/23 10:25 PM   Specimen: BLOOD LEFT HAND  Result Value Ref Range Status   Specimen Description BLOOD LEFT HAND  Final   Special Requests   Final    BOTTLES DRAWN AEROBIC ONLY Blood Culture adequate volume   Culture   Final    NO GROWTH 2 DAYS Performed at [facility], [address] [place], [place] [address]    Report Status PENDING  Incomplete  Culture, blood (Routine X 2)     Status: None (Preliminary result)   Collection Time: 03/24/23  8:54 AM   Specimen: BLOOD LEFT HAND  Result Value Ref Range Status   Specimen Description   Final    BLOOD LEFT HAND BOTTLES DRAWN AEROBIC AND ANAEROBIC   Special Requests Blood Culture adequate volume  Final   Culture   Final    NO GROWTH 1 DAY Performed at [facility], [address] [place], [place] [address]    Report Status PENDING  Incomplete  Culture, blood (Routine X 2)     Status: None (Preliminary result)   Collection Time: 03/24/23  8:54 AM   Specimen: BLOOD LEFT FOREARM  Result Value Ref Range Status   Specimen Description   Final    BLOOD LEFT FOREARM BOTTLES DRAWN AEROBIC AND ANAEROBIC   Special Requests Blood Culture adequate volume  Final   Culture   Final    NO GROWTH 1 DAY Performed at [facility], [address],  [place], [place] [address]    Report Status PENDING  Incomplete  Respiratory (~20 pathogens) panel by PCR     Status: None   Collection Time: 03/24/23 10:28 AM   Specimen: Nasopharyngeal Swab; Respiratory  Result Value Ref Range Status   Adenovirus NOT DETECTED NOT DETECTED Final   Coronavirus 229E NOT DETECTED NOT DETECTED Final    Comment: (NOTE) The Coronavirus on the Respiratory Panel, DOES NOT test for the novel  Coronavirus (2019 nCoV)    Coronavirus HKU1 NOT DETECTED NOT DETECTED Final   Coronavirus NL63 NOT DETECTED NOT DETECTED Final   Coronavirus OC43 NOT DETECTED NOT DETECTED Final   Metapneumovirus NOT DETECTED NOT DETECTED Final   Rhinovirus / Enterovirus NOT DETECTED NOT DETECTED Final   Influenza A NOT DETECTED NOT DETECTED Final   Influenza B NOT DETECTED NOT DETECTED Final  Parainfluenza Virus 1 NOT DETECTED NOT DETECTED Final   Parainfluenza Virus 2 NOT DETECTED NOT DETECTED Final   Parainfluenza Virus 3 NOT DETECTED NOT DETECTED Final   Parainfluenza Virus 4 NOT DETECTED NOT DETECTED Final   Respiratory Syncytial Virus NOT DETECTED NOT DETECTED Final   Bordetella pertussis NOT DETECTED NOT DETECTED Final   Bordetella Parapertussis NOT DETECTED NOT DETECTED Final   Chlamydophila pneumoniae NOT DETECTED NOT DETECTED Final   Mycoplasma pneumoniae NOT DETECTED NOT DETECTED Final    Comment: Performed at [facility], [address], [place], [place] [address]     Serology:    Imaging: If present, new imagings (plain films, ct scans, and mri) have been personally visualized and interpreted; radiology reports have been reviewed. Decision making incorporated into the Impression / Recommendations.  Pending brain mri wwo contrast    Raymondo Band, MD [facility] [phone] pager    03/25/2023, 11:07 AM

## 2023-03-25 NOTE — Plan of Care (Signed)
Problem: Education: Goal: Individualized Educational Video(s) Outcome: Progressing Pt's BSBG checked ACHS per MD's orders.  She is on sliding scale insulin for her BSBG coverage per MD's orders.     Problem: Nutritional: Goal: Maintenance of adequate nutrition will improve Outcome: Progressing Pt is on a carb modified diet per MD's orders.  She has been able to tolerate her diet w/o s/sx of n/v or abdominal pain/ distention.    Problem: Skin Integrity: Goal: Risk for impaired skin integrity will decrease Outcome: Progressing Skin integrity monitored and assessed q2 hours.  Instructed pt to turn/ reposition herself q2 hours to prevent further skin impairment.  Tubes and drains assessed for device related pressures sores.  Pt is incontinent of both bowel and bladder.  She is checked q2 hours for incontinence.  Perineal care given promptly after each incontinent episode.  Dressing changes performed per MD's orders.    Problem: Education: Goal: Knowledge of General Education information will improve Description: Including pain rating scale, medication(s)/side effects and non-pharmacologic comfort measures Outcome: Progressing Pt understands she was admitted into the hospital AMS.   Problem: Clinical Measurements: Goal: Will remain free from infection Outcome: Progressing S/Sx of infection monitored and assessed q-shift.  Pt has remained afebrile thus far.  She is on IV abx per MD's orders    Problem: Activity: Goal: Risk for activity intolerance will decrease Outcome: Progressing Pt is x1 assist OOB to the [facility].  She can reposition/ turn herself independently in the bed.     Problem: Pain Managment: Goal: General experience of comfort will improve Outcome: Progressing Pt has denied pain thus far.    Problem: Safety: Goal: Ability to remain free from injury will improve Outcome: Progressing Pt has remained free from falls thus far.  Instructed pt to utilize RN call light for  assistance.  Hourly rounds performed.  Bed alarm implemented to keep pt safe from falls.  Settings activated to third most sensitive mode.  Bed locked in lowest position, with two upper/ one lower side rails engaged.  Belongings and call light within reach.

## 2023-03-25 NOTE — Progress Notes (Signed)
PROGRESS NOTE    Kimberly [name]  WUJ:[id number] DOB: Feb 11, 1951 DOA: 03/19/2023 PCP: Tommie Sams, DO  Chief Complaint  Patient presents with   Altered Mental Status   low grade fever    Brief Narrative:    Kimberly [name] is Kimberly [name] 72 y.o. female with medical history significant of type 2 diabetes with nephropathy, depression, history of breast cancer (status post bilateral mastectomy), gastroesophageal flux disease, class I obesity, and hypothyroidism; who presented to the hospital secondary to temperature and altered mental status.  Symptoms have been present approximately since Friday (03/16/23) and worsening. Patient with recent UTI treated with Bactrim as an outpatient; however after finishing antibiotic has developed nausea vomiting and decreased oral intake.  -Admitted on 03/19/2023 with concerns for hyponatremia/dehydration and AKI with altered mentation -She has continued to have altered mentation and fevers similar to her presentation back in May 2024 -transferred on 03/24/2023 to [facility] for possible lumbar puncture and further infectious disease evaluation (Dr. Rutha Bouchard)  Assessment & Plan:   Principal Problem:   Acute metabolic encephalopathy Active Problems:   Essential hypertension, benign   Hypothyroidism   Depression   Nonalcoholic steatohepatitis (NASH)   Breast cancer of upper-outer quadrant of right female breast (HCC)   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)   Mixed hyperlipidemia   Hyponatremia   Class 1 obesity  1)Acute metabolic encephalopathy--multifactorial - suspect related to fever process below  - delirium precautions -TSH low (follow free T4) and B12 low normal (follow MMA) -CT head without acute intracranial abnormalities. -MRI pending -now s/p LP    2)Persistent Fevers-- -COVID test negative on 03/19/2023, repeat COVID test negative again on 03/21/2023 - 7/23 urine culture with staph epidermidis - 7/27 (7/24 and 7/26)  blood cultures pending (NGTD from 7/22) - RVP negative - She was treated with Bactrim for 1 week prior to admission for E. coli UTI based on sensitivities as per  urine culture from 03/09/2023 - Patient received IV Rocephin on 03/22/2023 - Patient received Cipro x 3 doses from 03/22/2023 to 03/23/2023 - Chest x-ray on 03/22/2023 without acute findings, CT abdomen/pelvis without acute abnormality -  appreciate ID assistance -> RPR, FTA, HIV serology, CSF arbovirus, ME PCR, glucose, protein, cell count, culture, Q fever serology, chlamydia psittaci serology, brain MRI - if ME panel PCR negative, discuss with neurology - acyclovir per ID  3)Hypophosphatemia/hyponatremia and hypokalemia----due to poor oral intake/dehydration and diuretic use prior to admission - hydrochlorothiazide and losartan on hold - improved with IVF   Class 1 obesity -Body mass index is 31.51 kg/m.   Mixed hyperlipidemia -continue to follow lipid panel as an outpatient -continue low fat/heart healthy diet   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) -A1c 6.4 -Continue to hold oral hypoglycemic agents while inpatient -Continue sliding scale insulin and Semglee.   Breast cancer of upper-outer quadrant of right female breast (HCC)-status post right-sided mastectomy -Continue outpatient follow-up with oncology service -Continue  Arimidex.   Nonalcoholic steatohepatitis (NASH) -Stable LFTs -Continue outpatient follow-up with gastroenterology service.   Depression -No suicidal ideation or hallucinations -Continue home antihypertensive agents.   Hypothyroidism -Continue Synthroid.   Essential hypertension, benign - holding HCTZ and losartan -Continue metoprolol -Avoid hypotension.     DVT prophylaxis: heparin Code Status: full Family Communication: daughter Disposition:   Status is: Inpatient Remains inpatient appropriate because: need for continued inpatient care   Consultants:   IR ID  Procedures:  LP IMPRESSION: 1. Fluoroscopically-guided L4-L5 lumbar puncture.  2. Opening pressure: 12 cm water. 3. Despite applying significant table tilt, only 1 mL of CSF could be collected for laboratory studies. This could potentially be due to patient dehydration. 4. No immediate post-procedure complication.  Antimicrobials:  Anti-infectives (From admission, onward)    Start     Dose/Rate Route Frequency Ordered Stop   03/24/23 1700  acyclovir (ZOVIRAX) 635 mg in dextrose 5 % 100 mL IVPB        10 mg/kg  63.7 kg (Adjusted) 112.7 mL/hr over 60 Minutes Intravenous Every 8 hours 03/24/23 1540     03/22/23 1500  ciprofloxacin (CIPRO) IVPB 400 mg  Status:  Discontinued        400 mg 200 mL/hr over 60 Minutes Intravenous Every 12 hours 03/22/23 1400 03/24/23 1015   03/22/23 0915  cefTRIAXone (ROCEPHIN) 2 g in sodium chloride 0.9 % 100 mL IVPB  Status:  Discontinued        2 g 200 mL/hr over 30 Minutes Intravenous Every 24 hours 03/22/23 0823 03/22/23 1646       Subjective: Confused Daughter at bedside  Objective: Vitals:   03/25/23 0337 03/25/23 0733 03/25/23 1233 03/25/23 1401  BP: 120/62 (!) 156/64 124/64 (!) 145/66  Pulse: 74 72 80 74  Resp: 18 16 16 12   Temp: 98.6 F (37 C) 97.8 F (36.6 C) 98.4 F (36.9 C) 98.2 F (36.8 C)  TempSrc:  Oral Oral Oral  SpO2: 98% 100% 98% 100%  Weight: 84.5 kg     Height:        Intake/Output Summary (Last 24 hours) at 03/25/2023 1628 Last data filed at 03/25/2023 0911 Gross per 24 hour  Intake 1493.23 ml  Output --  Net 1493.23 ml   Filed Weights   03/19/23 0959 03/19/23 1609 03/25/23 0337  Weight: 81.6 kg 80.7 kg 84.5 kg    Examination:  General exam: Appears calm and comfortable  Respiratory system: unlabored Cardiovascular system: RRR Gastrointestinal system: Abdomen is nondistended, soft and nontender. Central nervous system: lethargic post procedure, more confused than earlier this AM (daughter notes  worse after ativan) Extremities: no LEE   Data Reviewed: I have personally reviewed following labs and imaging studies  CBC: Recent Labs  Lab 03/19/23 1036 03/20/23 0412 03/22/23 0839 03/23/23 2208 03/24/23 0501 03/24/23 0854 03/25/23 1052  WBC 7.7   < > 4.9 3.4* 3.9* 3.0* 3.2*  NEUTROABS 4.1  --   --  2.0  --  2.3 1.0*  HGB 12.1   < > 9.6* 9.3* 9.6* 8.9* 8.6*  HCT 34.9*   < > 28.2* 26.6* 28.5* 26.0* 25.9*  MCV 92.3   < > 93.1 90.8 92.8 91.5 93.2  PLT 214   < > 120* 132* 134* 131* 157   < > = values in this interval not displayed.    Basic Metabolic Panel: Recent Labs  Lab 03/19/23 1036 03/20/23 0412 03/21/23 0344 03/22/23 0839 03/23/23 0430 03/23/23 2208 03/24/23 0128 03/24/23 0501 03/24/23 0854 03/25/23 1052  NA 123* 129*   < > 126* 125* 125* 126* 125* 126* 130*  K 4.6 3.9   < > 3.5 3.7 3.7 3.6 3.5 3.3* 4.5  CL 92* 102   < > 100 100 100 100 99 101 104  CO2 22 17*   < > 19* 17* 16* 17* 19* 19* 18*  GLUCOSE 111* 79   < > 158* 127* 137* 142* 157* 140* 133*  BUN 35* 29*   < > 26* 20 19 18  16  15 11  CREATININE 1.73* 1.22*   < > 1.14* 0.94 0.92 0.92 0.89 0.90 0.91  CALCIUM 9.5 8.8*   < > 7.8* 7.0* 7.3* 7.3* 7.3* 7.1* 7.5*  MG 1.5* 1.8  --   --   --   --   --   --   --   --   PHOS 2.9  --   --  1.7* 2.7  --   --   --   --   --    < > = values in this interval not displayed.    GFR: Estimated Creatinine Clearance: 58.4 mL/min (by C-G formula based on SCr of 0.91 mg/dL).  Liver Function Tests: Recent Labs  Lab 03/19/23 1036 03/22/23 0839 03/23/23 0430 03/24/23 0854 03/25/23 1052  AST 27  --   --  40 66*  ALT 23  --   --  29 46*  ALKPHOS 86  --   --  59 63  BILITOT 0.8  --   --  1.2 0.5  PROT 8.0  --   --  5.5* 5.9*  ALBUMIN 3.6 2.8* 2.3* 2.3* 2.2*    CBG: Recent Labs  Lab 03/24/23 1142 03/24/23 1638 03/24/23 2140 03/25/23 0753 03/25/23 1234  GLUCAP 147* 153* 121* 117* 133*     Recent Results (from the past 240 hour(s))  SARS Coronavirus 2 by  RT PCR (hospital order, performed in [facility] hospital lab) *cepheid single result test* Anterior Nasal Swab     Status: None   Collection Time: 03/19/23 10:33 AM   Specimen: Anterior Nasal Swab  Result Value Ref Range Status   SARS Coronavirus 2 by RT PCR NEGATIVE NEGATIVE Final    Comment: (NOTE) SARS-CoV-2 target nucleic acids are NOT DETECTED.  The SARS-CoV-2 RNA is generally detectable in upper and lower respiratory specimens during the acute phase of infection. The lowest concentration of SARS-CoV-2 viral copies this assay can detect is 250 copies / mL. Anysa Tacey negative result does not preclude SARS-CoV-2 infection and should not be used as the sole basis for treatment or other patient management decisions.  Azzan Butler negative result may occur with improper specimen collection / handling, submission of specimen other than nasopharyngeal swab, presence of viral mutation(s) within the areas targeted by this assay, and inadequate number of viral copies (<250 copies / mL). Kessler Kopinski negative result must be combined with clinical observations, patient history, and epidemiological information.  Fact Sheet for Patients:   RoadLapTop.co.za  Fact Sheet for Healthcare Providers: http://kim-miller.com/  This test is not yet approved or  cleared by the [place] FDA and has been authorized for detection and/or diagnosis of SARS-CoV-2 by FDA under an Emergency Use Authorization (EUA).  This EUA will remain in effect (meaning this test can be used) for the duration of the COVID-19 declaration under Section 564(b)(1) of the Act, 21 U.S.C. section 360bbb-3(b)(1), unless the authorization is terminated or revoked sooner.  Performed at [facility], [address] [place], [place] [address]   Blood Culture (routine x 2)     Status: None   Collection Time: 03/19/23 10:36 AM   Specimen: BLOOD  Result Value Ref Range Status   Specimen Description BLOOD BLOOD LEFT  ARM  Final   Special Requests   Final    BOTTLES DRAWN AEROBIC AND ANAEROBIC Blood Culture adequate volume   Culture   Final    NO GROWTH 5 DAYS Performed at [facility], [address], [place], [place] [address]  Report Status 03/24/2023 FINAL  Final  Blood Culture (routine x 2)     Status: None   Collection Time: 03/19/23 10:36 AM   Specimen: BLOOD  Result Value Ref Range Status   Specimen Description BLOOD BLOOD LEFT ARM  Final   Special Requests   Final    BOTTLES DRAWN AEROBIC AND ANAEROBIC Blood Culture adequate volume   Culture   Final    NO GROWTH 5 DAYS Performed at [facility], [address], [place], [place] [address]    Report Status 03/24/2023 FINAL  Final  Urine Culture (for pregnant, neutropenic or urologic patients or patients with an indwelling urinary catheter)     Status: Abnormal   Collection Time: 03/20/23  4:50 PM   Specimen: Urine, Clean Catch  Result Value Ref Range Status   Specimen Description   Final    URINE, CLEAN CATCH Performed at [facility], [address], [place], [place] [address]    Special Requests   Final    NONE Performed at [facility], [address] [place], [place] [address]    Culture 20,000 COLONIES/mL STAPHYLOCOCCUS EPIDERMIDIS (Audray Rumore)  Final   Report Status 03/22/2023 FINAL  Final   Organism ID, Bacteria STAPHYLOCOCCUS EPIDERMIDIS (Yianni Skilling)  Final      Susceptibility   Staphylococcus epidermidis - MIC*    CIPROFLOXACIN <=0.5 SENSITIVE Sensitive     GENTAMICIN <=0.5 SENSITIVE Sensitive     NITROFURANTOIN <=16 SENSITIVE Sensitive     OXACILLIN >=4 RESISTANT Resistant     TETRACYCLINE 2 SENSITIVE Sensitive     VANCOMYCIN 2 SENSITIVE Sensitive     TRIMETH/SULFA 80 RESISTANT Resistant     CLINDAMYCIN <=0.25 SENSITIVE Sensitive     RIFAMPIN <=0.5 SENSITIVE Sensitive     Inducible Clindamycin NEGATIVE Sensitive     * 20,000 COLONIES/mL STAPHYLOCOCCUS EPIDERMIDIS  Culture, blood (Routine X 2) w Reflex to ID Panel     Status: None  (Preliminary result)   Collection Time: 03/21/23  1:34 PM   Specimen: BLOOD  Result Value Ref Range Status   Specimen Description BLOOD BLOOD LEFT HAND  Final   Special Requests   Final    BOTTLES DRAWN AEROBIC AND ANAEROBIC Blood Culture adequate volume   Culture   Final    NO GROWTH 4 DAYS Performed at [facility], [address] [place], [place] [address]    Report Status PENDING  Incomplete  SARS Coronavirus 2 by RT PCR (hospital order, performed in [facility] hospital lab) *cepheid single result test* Anterior Nasal Swab     Status: None   Collection Time: 03/21/23  1:35 PM   Specimen: Anterior Nasal Swab  Result Value Ref Range Status   SARS Coronavirus 2 by RT PCR NEGATIVE NEGATIVE Final    Comment: (NOTE) SARS-CoV-2 target nucleic acids are NOT DETECTED.  The SARS-CoV-2 RNA is generally detectable in upper and lower respiratory specimens during the acute phase of infection. The lowest concentration of SARS-CoV-2 viral copies this assay can detect is 250 copies / mL. Elizabth Palka negative result does not preclude SARS-CoV-2 infection and should not be used as the sole basis for treatment or other patient management decisions.  Lycan Davee negative result may occur with improper specimen collection / handling, submission of specimen other than nasopharyngeal swab, presence of viral mutation(s) within the areas targeted by this assay, and inadequate number of viral copies (<250 copies / mL). Kyler Germer negative result must be combined with clinical observations, patient history, and epidemiological information.  Fact  Sheet for Patients:   RoadLapTop.co.za  Fact Sheet for Healthcare Providers: http://kim-miller.com/  This test is not yet approved or  cleared by the [place] FDA and has been authorized for detection and/or diagnosis of SARS-CoV-2 by FDA under an Emergency Use Authorization (EUA).  This EUA will remain in effect (meaning this test can  be used) for the duration of the COVID-19 declaration under Section 564(b)(1) of the Act, 21 U.S.C. section 360bbb-3(b)(1), unless the authorization is terminated or revoked sooner.  Performed at [facility], [address], [place], [place] [address]   Culture, blood (Routine X 2) w Reflex to ID Panel     Status: None (Preliminary result)   Collection Time: 03/21/23  1:38 PM   Specimen: BLOOD  Result Value Ref Range Status   Specimen Description BLOOD BLOOD LEFT ARM  Final   Special Requests   Final    BOTTLES DRAWN AEROBIC AND ANAEROBIC Blood Culture adequate volume   Culture   Final    NO GROWTH 4 DAYS Performed at [facility], [address] [place], [place] [address]    Report Status PENDING  Incomplete  Culture, blood (Routine X 2) w Reflex to ID Panel     Status: None (Preliminary result)   Collection Time: 03/23/23 10:08 PM   Specimen: Left Antecubital; Blood  Result Value Ref Range Status   Specimen Description LEFT ANTECUBITAL  Final   Special Requests   Final    BOTTLES DRAWN AEROBIC AND ANAEROBIC Blood Culture results may not be optimal due to an excessive volume of blood received in culture bottles   Culture   Final    NO GROWTH 2 DAYS Performed at [facility], [address] [place], [place] [address]    Report Status PENDING  Incomplete  Culture, blood (Routine X 2) w Reflex to ID Panel     Status: None (Preliminary result)   Collection Time: 03/23/23 10:25 PM   Specimen: BLOOD LEFT HAND  Result Value Ref Range Status   Specimen Description BLOOD LEFT HAND  Final   Special Requests   Final    BOTTLES DRAWN AEROBIC ONLY Blood Culture adequate volume   Culture   Final    NO GROWTH 2 DAYS Performed at [facility], [address] [place], [place] [address]    Report Status PENDING  Incomplete  Culture, blood (Routine X 2)     Status: None (Preliminary result)   Collection Time: 03/24/23  8:54 AM   Specimen: BLOOD LEFT HAND  Result Value Ref Range Status    Specimen Description   Final    BLOOD LEFT HAND BOTTLES DRAWN AEROBIC AND ANAEROBIC   Special Requests Blood Culture adequate volume  Final   Culture   Final    NO GROWTH 1 DAY Performed at [facility], [address] [place], [place] [address]    Report Status PENDING  Incomplete  Culture, blood (Routine X 2)     Status: None (Preliminary result)   Collection Time: 03/24/23  8:54 AM   Specimen: BLOOD LEFT FOREARM  Result Value Ref Range Status   Specimen Description   Final    BLOOD LEFT FOREARM BOTTLES DRAWN AEROBIC AND ANAEROBIC   Special Requests Blood Culture adequate volume  Final   Culture   Final    NO GROWTH 1 DAY Performed at [facility], [address] [place], [place] [address]    Report Status PENDING  Incomplete  Respiratory (~20 pathogens) panel by PCR  Status: None   Collection Time: 03/24/23 10:28 AM   Specimen: Nasopharyngeal Swab; Respiratory  Result Value Ref Range Status   Adenovirus NOT DETECTED NOT DETECTED Final   Coronavirus 229E NOT DETECTED NOT DETECTED Final    Comment: (NOTE) The Coronavirus on the Respiratory Panel, DOES NOT test for the novel  Coronavirus (2019 nCoV)    Coronavirus HKU1 NOT DETECTED NOT DETECTED Final   Coronavirus NL63 NOT DETECTED NOT DETECTED Final   Coronavirus OC43 NOT DETECTED NOT DETECTED Final   Metapneumovirus NOT DETECTED NOT DETECTED Final   Rhinovirus / Enterovirus NOT DETECTED NOT DETECTED Final   Influenza Jp Eastham NOT DETECTED NOT DETECTED Final   Influenza B NOT DETECTED NOT DETECTED Final   Parainfluenza Virus 1 NOT DETECTED NOT DETECTED Final   Parainfluenza Virus 2 NOT DETECTED NOT DETECTED Final   Parainfluenza Virus 3 NOT DETECTED NOT DETECTED Final   Parainfluenza Virus 4 NOT DETECTED NOT DETECTED Final   Respiratory Syncytial Virus NOT DETECTED NOT DETECTED Final   Bordetella pertussis NOT DETECTED NOT DETECTED Final   Bordetella Parapertussis NOT DETECTED NOT DETECTED Final   Chlamydophila pneumoniae NOT  DETECTED NOT DETECTED Final   Mycoplasma pneumoniae NOT DETECTED NOT DETECTED Final    Comment: Performed at [facility], [address], [place], [place] [address]         Radiology Studies: DG [place] GUIDED LUMBAR PUNCTURE  Result Date: 03/25/2023 CLINICAL DATA:  Confusion. Altered mental status. Fever. Request received for fluoroscopically-guided diagnostic lumbar puncture. EXAM: DIAGNOSTIC LUMBAR PUNCTURE UNDER FLUOROSCOPIC GUIDANCE COMPARISON:  Head CT from 03/24/2023 reviewed prior to the procedure. FLUOROSCOPY: Radiation Exposure Index (as provided by the fluoroscopic device): 27.50 mGy Kerma PROCEDURE: The patient was positioned prone on the fluoroscopy table. An appropriate skin entry site was determined under fluoroscopy and marked. The operated donned sterile gloves and Daanya Lanphier mask. The lower back was prepped and draped in the usual sterile fashion. Local anesthesia was provided with 1% lidocaine. Under intermittent fluoroscopy, lumbar puncture was performed at the L4-L5 level using Tiler Brandis 20 gauge spinal needle. Initially, there was return of faintly blood-tinged CSF. There was an opening pressure of 12 cm water. The discoloration of the CSF quickly cleared as CSF exited the spinal needle and entered the collection tubing. Despite applying significant table tilt, only 1 mL of CSF could be collected for laboratory studies. The inner stylet was replaced within the needle and the needle was removed in its entirety. The patient tolerated the procedure well and no immediate post-procedure complication was apparent. The procedure was performed by Alwyn Ren, NP, supervised and briefly assisted by Dr. Jackey Loge. IMPRESSION: 1. Fluoroscopically-guided L4-L5 lumbar puncture. 2. Opening pressure: 12 cm water. 3. Despite applying significant table tilt, only 1 mL of CSF could be collected for laboratory studies. This could potentially be due to patient dehydration. 4. No immediate post-procedure  complication. Electronically Signed   By: Jackey Loge D.O.   On: 03/25/2023 14:10   CT HEAD WO CONTRAST ( )  Result Date: 03/24/2023 CLINICAL DATA:  Mental status change, unknown cause EXAM: CT HEAD WITHOUT CONTRAST TECHNIQUE: Contiguous axial images were obtained from the base of the skull through the vertex without intravenous contrast. RADIATION DOSE REDUCTION: This exam was performed according to the departmental dose-optimization program which includes automated exposure control, adjustment of the mA and/or kV according to patient size and/or use of iterative reconstruction technique. COMPARISON:  03/19/2023 FINDINGS: Brain: No evidence of acute infarction, hemorrhage, hydrocephalus, extra-axial collection  or mass lesion/mass effect. Scattered low-density changes within the periventricular and subcortical white matter most compatible with chronic microvascular ischemic change. Vascular: No hyperdense vessel or unexpected calcification. Skull: Normal. Negative for fracture or focal lesion. Sinuses/Orbits: Mild ethmoid sinus mucosal thickening. Other: None. IMPRESSION: 1. No acute intracranial findings. Consider follow-up with MRI, as clinically indicated. 2. Chronic microvascular ischemic change. Electronically Signed   By: Duanne Guess D.O.   On: 03/24/2023 14:21        Scheduled Meds:  anastrozole  1 mg Oral Daily   famotidine  20 mg Oral QHS   feeding supplement  237 mL Oral BID BM   gabapentin  100 mg Oral TID   heparin  5,000 Units Subcutaneous Q8H   insulin aspart  0-5 Units Subcutaneous QHS   insulin aspart  0-9 Units Subcutaneous TID WC   insulin glargine-yfgn  5 Units Subcutaneous QHS   levothyroxine  125 mcg Oral QAC breakfast   metoprolol succinate  100 mg Oral Daily   mirabegron ER  25 mg Oral QPM   multivitamin with minerals  1 tablet Oral Daily   pantoprazole  40 mg Oral Daily   sodium bicarbonate  650 mg Oral BID   venlafaxine XR  75 mg Oral QHS   Continuous  Infusions:  sodium chloride 125 mL/hr at 03/25/23 1444   acyclovir 635 mg (03/25/23 1034)     LOS: 5 days    Time spent: over 30 min    Lacretia Nicks, MD [facility]   To contact the attending provider between 7A-7P or the covering provider during after hours 7P-7A, please log into the web site [email or web address] and access using universal [place] password for that web site. If you do not have the password, please call the hospital operator.  03/25/2023, 4:28 PM

## 2023-03-25 NOTE — Plan of Care (Signed)
Csf only 3 wbc Me panel pcr negative   Stop acyclovir

## 2023-03-25 NOTE — Procedures (Signed)
L3/L4 diagnostic lumbar puncture. Please see full dictation under the imaging tab in Epic.  Alwyn Ren, [place] [phone] 03/25/2023, 12:07 PM

## 2023-03-25 NOTE — Plan of Care (Signed)
  Problem: Tissue Perfusion: Goal: Adequacy of tissue perfusion will improve Outcome: Progressing   Problem: Activity: Goal: Risk for activity intolerance will decrease Outcome: Progressing   Problem: Coping: Goal: Level of anxiety will decrease Outcome: Progressing   Problem: Tissue Perfusion: Goal: Adequacy of tissue perfusion will improve Outcome: Progressing   Problem: Activity: Goal: Risk for activity intolerance will decrease Outcome: Progressing   Problem: Coping: Goal: Level of anxiety will decrease Outcome: Progressing

## 2023-03-26 DIAGNOSIS — A419 Sepsis, unspecified organism: Secondary | ICD-10-CM | POA: Diagnosis not present

## 2023-03-26 DIAGNOSIS — G0490 Encephalitis and encephalomyelitis, unspecified: Secondary | ICD-10-CM | POA: Diagnosis not present

## 2023-03-26 DIAGNOSIS — R410 Disorientation, unspecified: Secondary | ICD-10-CM

## 2023-03-26 DIAGNOSIS — G9341 Metabolic encephalopathy: Secondary | ICD-10-CM | POA: Diagnosis not present

## 2023-03-26 LAB — GLUCOSE, CAPILLARY
Glucose-Capillary: 102 mg/dL — ABNORMAL HIGH (ref 70–99)
Glucose-Capillary: 108 mg/dL — ABNORMAL HIGH (ref 70–99)
Glucose-Capillary: 110 mg/dL — ABNORMAL HIGH (ref 70–99)
Glucose-Capillary: 127 mg/dL — ABNORMAL HIGH (ref 70–99)
Glucose-Capillary: 128 mg/dL — ABNORMAL HIGH (ref 70–99)
Glucose-Capillary: 140 mg/dL — ABNORMAL HIGH (ref 70–99)
Glucose-Capillary: 147 mg/dL — ABNORMAL HIGH (ref 70–99)
Glucose-Capillary: 95 mg/dL (ref 70–99)

## 2023-03-26 MED ORDER — MAGNESIUM SULFATE 4 GM/100ML IV SOLN
4.0000 g | Freq: Once | INTRAVENOUS | Status: AC
  Administered 2023-03-26: 4 g via INTRAVENOUS
  Filled 2023-03-26: qty 100

## 2023-03-26 NOTE — Progress Notes (Signed)
[facility]  Date of Admission:  03/19/2023     Abx: 7/27-28 acyclovir 7/25-27 cipro                                        7/12-22 bactrim   Assessment: 72 yo female dm2, ckd, depression, hx breast cancer s/p bilateral mastectomy, gerd, hypothryoidism admitted 7/22 with fever and confusion without clear cause   Patient had received abx bactrim for presumed uti 7/12 from urology. Ucx ecoli (appear to have had ecoli colonization of urinary tract0   Her wbc here is normal initially and now down trending along with mild thrombocytopenia. There is no gi sx/rash.   7/23 ucx 20k staph epi, not clinically significant, and not a uti pathogen 7/22, 7/24, 7/26, 7/27 bcx ngtd; 7/27 respiratory viral pcr negative   I suspect encephalitidies possibly arbovirus vs enterovirus. There is reactive lymphocytes/smudge cells on peripheral smear which could be suggestive of viral process   [place] livestocks (although she does have close by farms that do have livestock), very active with outside the house chores. She has a Engineer, petroleum. We will consider q-fever or psittacosis   She does have hx of breast cancer and previously in 12/2022 similar ams and reported uti sx. All cultures including lp and urine negative; csf without pleocytosis or other abnormality. Either improved with abx or self resolved. If no clear cause for this episode, will have to think about autoimmune/paraneoplastic process   Do not suspect tick related illness such as lyme or spotted fever process   -------------- 7/29 assessment Leukopenia recovering; transaminitis improving Less confused today Fever curve looking better  Hoping she is turning the corner   Me pcr negative. Not enough fluid to do cellcount/diff/chemistry (I mentioned 3 wbc but that was from 12/2022 lp)  Pending serum and csf arbovirus serology, and serum chlamydia psittaci and qfever  serology along with syphilis   Hiv screen nonreactive    Plan: F/u Rpr, fta, psittacosis, qfever and arbor virus serology Neurology seeing patient; await their thoughts Updated patient's daughter Discussed with primary team    Principal Problem:   Acute metabolic encephalopathy Active Problems:   Essential hypertension, benign   Hypothyroidism   Depression   Nonalcoholic steatohepatitis (NASH)   Breast cancer of upper-outer quadrant of right female breast (HCC)   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)   Mixed hyperlipidemia   Hyponatremia   Class 1 obesity   Allergies  Allergen Reactions   Ace Inhibitors Cough    Scheduled Meds:  anastrozole  1 mg Oral Daily   famotidine  20 mg Oral QHS   feeding supplement  237 mL Oral BID BM   gabapentin  100 mg Oral TID   heparin  5,000 Units Subcutaneous Q8H   insulin aspart  0-5 Units Subcutaneous QHS   insulin aspart  0-9 Units Subcutaneous TID WC   insulin glargine-yfgn  5 Units Subcutaneous QHS   levothyroxine  125 mcg Oral QAC breakfast   metoprolol succinate  100 mg Oral Daily   mirabegron ER  25 mg Oral QPM   multivitamin with minerals  1 tablet Oral Daily   pantoprazole  40 mg Oral Daily   venlafaxine XR  75 mg Oral QHS   Continuous Infusions: PRN Meds:.acetaminophen **OR** acetaminophen, alum &  mag hydroxide-simeth, ondansetron **OR** ondansetron (ZOFRAN) IV, mouth rinse, traZODone   SUBJECTIVE: No complaint Afebrile Spoke with nursing staff. Still confused No n/v/diarrhea/rash No headache  Review of Systems: ROS All other ROS was negative, except mentioned above     OBJECTIVE: Vitals:   03/25/23 2300 03/26/23 0338 03/26/23 0827 03/26/23 1226  BP: 134/78 (!) 148/69 (!) 135/93 (!) 155/71  Pulse: 89 74 74 74  Resp:  18 18 18   Temp: 97.8 F (36.6 C) 97.9 F (36.6 C) 98 F (36.7 C) 98 F (36.7 C)  TempSrc: Oral Oral    SpO2: 99% 98% 99% 100%  Weight:  86.7 kg    Height:       Body mass  index is 33.86 kg/m.  Physical Exam General/constitutional: no distress, pleasant; sitting in bed conversant, and appropriate, appears a little slow still; much better than yesterday when I saw her HEENT: Normocephalic, PER, Conj Clear, EOMI, Oropharynx clear Neck supple CV: rrr no mrg Lungs: clear to auscultation, normal respiratory effort Abd: Soft, Nontender Ext: no edema Skin: No Rash Neuro: nonfocal MSK: no peripheral joint swelling/tenderness/warmth; back spines nontender   Lab Results Lab Results  Component Value Date   WBC 4.3 03/26/2023   HGB 9.0 (L) 03/26/2023   HCT 27.2 (L) 03/26/2023   MCV 92.5 03/26/2023   PLT 218 03/26/2023    Lab Results  Component Value Date   CREATININE 0.86 03/26/2023   BUN 7 (L) 03/26/2023   NA 133 (L) 03/26/2023   K 3.9 03/26/2023   CL 107 03/26/2023   CO2 18 (L) 03/26/2023    Lab Results  Component Value Date   ALT 41 03/26/2023   AST 47 (H) 03/26/2023   ALKPHOS 60 03/26/2023   BILITOT 0.5 03/26/2023      Microbiology: Recent Results (from the past 240 hour(s))  SARS Coronavirus 2 by RT PCR (hospital order, performed in [facility] hospital lab) *cepheid single result test* Anterior Nasal Swab     Status: None   Collection Time: 03/19/23 10:33 AM   Specimen: Anterior Nasal Swab  Result Value Ref Range Status   SARS Coronavirus 2 by RT PCR NEGATIVE NEGATIVE Final    Comment: (NOTE) SARS-CoV-2 target nucleic acids are NOT DETECTED.  The SARS-CoV-2 RNA is generally detectable in upper and lower respiratory specimens during the acute phase of infection. The lowest concentration of SARS-CoV-2 viral copies this assay can detect is 250 copies / mL. A negative result does not preclude SARS-CoV-2 infection and should not be used as the sole basis for treatment or other patient management decisions.  A negative result may occur with improper specimen collection / handling, submission of specimen other than nasopharyngeal swab,  presence of viral mutation(s) within the areas targeted by this assay, and inadequate number of viral copies (<250 copies / mL). A negative result must be combined with clinical observations, patient history, and epidemiological information.  Fact Sheet for Patients:   RoadLapTop.co.za  Fact Sheet for Healthcare Providers: http://kim-miller.com/  This test is not yet approved or  cleared by the [place] FDA and has been authorized for detection and/or diagnosis of SARS-CoV-2 by FDA under an Emergency Use Authorization (EUA).  This EUA will remain in effect (meaning this test can be used) for the duration of the COVID-19 declaration under Section 564(b)(1) of the Act, 21 U.S.C. section 360bbb-3(b)(1), unless the authorization is terminated or revoked sooner.  Performed at [facility], [address] [place], [place] [address]  Blood Culture (routine x 2)     Status: None   Collection Time: 03/19/23 10:36 AM   Specimen: BLOOD  Result Value Ref Range Status   Specimen Description BLOOD BLOOD LEFT ARM  Final   Special Requests   Final    BOTTLES DRAWN AEROBIC AND ANAEROBIC Blood Culture adequate volume   Culture   Final    NO GROWTH 5 DAYS Performed at [facility], [address], [place], [place] [address]    Report Status 03/24/2023 FINAL  Final  Blood Culture (routine x 2)     Status: None   Collection Time: 03/19/23 10:36 AM   Specimen: BLOOD  Result Value Ref Range Status   Specimen Description BLOOD BLOOD LEFT ARM  Final   Special Requests   Final    BOTTLES DRAWN AEROBIC AND ANAEROBIC Blood Culture adequate volume   Culture   Final    NO GROWTH 5 DAYS Performed at [facility], [address] [place], [place] [address]    Report Status 03/24/2023 FINAL  Final  Urine Culture (for pregnant, neutropenic or urologic patients or patients with an indwelling urinary catheter)     Status: Abnormal   Collection Time: 03/20/23   4:50 PM   Specimen: Urine, Clean Catch  Result Value Ref Range Status   Specimen Description   Final    URINE, CLEAN CATCH Performed at [facility], [address] [place], [place] [address]    Special Requests   Final    NONE Performed at [facility], [address] [place], [place] [address]    Culture 20,000 COLONIES/mL STAPHYLOCOCCUS EPIDERMIDIS (A)  Final   Report Status 03/22/2023 FINAL  Final   Organism ID, Bacteria STAPHYLOCOCCUS EPIDERMIDIS (A)  Final      Susceptibility   Staphylococcus epidermidis - MIC*    CIPROFLOXACIN <=0.5 SENSITIVE Sensitive     GENTAMICIN <=0.5 SENSITIVE Sensitive     NITROFURANTOIN <=16 SENSITIVE Sensitive     OXACILLIN >=4 RESISTANT Resistant     TETRACYCLINE 2 SENSITIVE Sensitive     VANCOMYCIN 2 SENSITIVE Sensitive     TRIMETH/SULFA 80 RESISTANT Resistant     CLINDAMYCIN <=0.25 SENSITIVE Sensitive     RIFAMPIN <=0.5 SENSITIVE Sensitive     Inducible Clindamycin NEGATIVE Sensitive     * 20,000 COLONIES/mL STAPHYLOCOCCUS EPIDERMIDIS  Culture, blood (Routine X 2) w Reflex to ID Panel     Status: None   Collection Time: 03/21/23  1:34 PM   Specimen: BLOOD  Result Value Ref Range Status   Specimen Description BLOOD BLOOD LEFT HAND  Final   Special Requests   Final    BOTTLES DRAWN AEROBIC AND ANAEROBIC Blood Culture adequate volume   Culture   Final    NO GROWTH 5 DAYS Performed at [facility], [address], [place], [place] [address]    Report Status 03/26/2023 FINAL  Final  SARS Coronavirus 2 by RT PCR (hospital order, performed in [facility] hospital lab) *cepheid single result test* Anterior Nasal Swab     Status: None   Collection Time: 03/21/23  1:35 PM   Specimen: Anterior Nasal Swab  Result Value Ref Range Status   SARS Coronavirus 2 by RT PCR NEGATIVE NEGATIVE Final    Comment: (NOTE) SARS-CoV-2 target nucleic acids are NOT DETECTED.  The SARS-CoV-2 RNA is generally detectable in upper and lower respiratory specimens  during the acute phase of infection. The lowest concentration of SARS-CoV-2 viral copies this assay can detect is  250 copies / mL. A negative result does not preclude SARS-CoV-2 infection and should not be used as the sole basis for treatment or other patient management decisions.  A negative result may occur with improper specimen collection / handling, submission of specimen other than nasopharyngeal swab, presence of viral mutation(s) within the areas targeted by this assay, and inadequate number of viral copies (<250 copies / mL). A negative result must be combined with clinical observations, patient history, and epidemiological information.  Fact Sheet for Patients:   RoadLapTop.co.za  Fact Sheet for Healthcare Providers: http://kim-miller.com/  This test is not yet approved or  cleared by the [place] FDA and has been authorized for detection and/or diagnosis of SARS-CoV-2 by FDA under an Emergency Use Authorization (EUA).  This EUA will remain in effect (meaning this test can be used) for the duration of the COVID-19 declaration under Section 564(b)(1) of the Act, 21 U.S.C. section 360bbb-3(b)(1), unless the authorization is terminated or revoked sooner.  Performed at [facility], [address] [place], [place] [address]   Culture, blood (Routine X 2) w Reflex to ID Panel     Status: None   Collection Time: 03/21/23  1:38 PM   Specimen: BLOOD  Result Value Ref Range Status   Specimen Description BLOOD BLOOD LEFT ARM  Final   Special Requests   Final    BOTTLES DRAWN AEROBIC AND ANAEROBIC Blood Culture adequate volume   Culture   Final    NO GROWTH 5 DAYS Performed at [facility], [address] [place], [place] [address]    Report Status 03/26/2023 FINAL  Final  Culture, blood (Routine X 2) w Reflex to ID Panel     Status: None (Preliminary result)   Collection Time: 03/23/23 10:08 PM   Specimen: Left Antecubital;  Blood  Result Value Ref Range Status   Specimen Description LEFT ANTECUBITAL  Final   Special Requests   Final    BOTTLES DRAWN AEROBIC AND ANAEROBIC Blood Culture results may not be optimal due to an excessive volume of blood received in culture bottles   Culture   Final    NO GROWTH 3 DAYS Performed at [facility], [address], [place], [place] [address]    Report Status PENDING  Incomplete  Culture, blood (Routine X 2) w Reflex to ID Panel     Status: None (Preliminary result)   Collection Time: 03/23/23 10:25 PM   Specimen: BLOOD LEFT HAND  Result Value Ref Range Status   Specimen Description BLOOD LEFT HAND  Final   Special Requests   Final    BOTTLES DRAWN AEROBIC ONLY Blood Culture adequate volume   Culture   Final    NO GROWTH 3 DAYS Performed at [facility], [address], [place], [place] [address]    Report Status PENDING  Incomplete  Culture, blood (Routine X 2)     Status: None (Preliminary result)   Collection Time: 03/24/23  8:54 AM   Specimen: BLOOD LEFT HAND  Result Value Ref Range Status   Specimen Description   Final    BLOOD LEFT HAND BOTTLES DRAWN AEROBIC AND ANAEROBIC   Special Requests Blood Culture adequate volume  Final   Culture   Final    NO GROWTH 2 DAYS Performed at [facility], [address], [place], [place] [address]    Report Status PENDING  Incomplete  Culture, blood (Routine X 2)     Status: None (Preliminary result)   Collection Time: 03/24/23  8:54 AM   Specimen: BLOOD LEFT FOREARM  Result Value Ref Range Status   Specimen Description   Final    BLOOD LEFT FOREARM BOTTLES DRAWN AEROBIC AND ANAEROBIC   Special Requests Blood Culture adequate volume  Final   Culture   Final    NO GROWTH 2 DAYS Performed at [facility], [address] [place], [place] [address]    Report Status PENDING  Incomplete  Respiratory (~20 pathogens) panel by PCR     Status: None   Collection Time: 03/24/23 10:28 AM   Specimen: Nasopharyngeal Swab;  Respiratory  Result Value Ref Range Status   Adenovirus NOT DETECTED NOT DETECTED Final   Coronavirus 229E NOT DETECTED NOT DETECTED Final    Comment: (NOTE) The Coronavirus on the Respiratory Panel, DOES NOT test for the novel  Coronavirus (2019 nCoV)    Coronavirus HKU1 NOT DETECTED NOT DETECTED Final   Coronavirus NL63 NOT DETECTED NOT DETECTED Final   Coronavirus OC43 NOT DETECTED NOT DETECTED Final   Metapneumovirus NOT DETECTED NOT DETECTED Final   Rhinovirus / Enterovirus NOT DETECTED NOT DETECTED Final   Influenza A NOT DETECTED NOT DETECTED Final   Influenza B NOT DETECTED NOT DETECTED Final   Parainfluenza Virus 1 NOT DETECTED NOT DETECTED Final   Parainfluenza Virus 2 NOT DETECTED NOT DETECTED Final   Parainfluenza Virus 3 NOT DETECTED NOT DETECTED Final   Parainfluenza Virus 4 NOT DETECTED NOT DETECTED Final   Respiratory Syncytial Virus NOT DETECTED NOT DETECTED Final   Bordetella pertussis NOT DETECTED NOT DETECTED Final   Bordetella Parapertussis NOT DETECTED NOT DETECTED Final   Chlamydophila pneumoniae NOT DETECTED NOT DETECTED Final   Mycoplasma pneumoniae NOT DETECTED NOT DETECTED Final    Comment: Performed at [facility], [address], [place], [place] [address]  Culture, blood (Routine X 2)     Status: None (Preliminary result)   Collection Time: 03/25/23  9:49 AM   Specimen: BLOOD  Result Value Ref Range Status   Specimen Description BLOOD SITE NOT SPECIFIED  Final   Special Requests   Final    BOTTLES DRAWN AEROBIC AND ANAEROBIC Blood Culture adequate volume   Culture   Final    NO GROWTH 1 DAY Performed at [facility], [address], [place], [place] [address]    Report Status PENDING  Incomplete  Culture, blood (Routine X 2)     Status: None (Preliminary result)   Collection Time: 03/25/23 10:42 AM   Specimen: BLOOD LEFT HAND  Result Value Ref Range Status   Specimen Description BLOOD LEFT HAND  Final   Special Requests   Final     BOTTLES DRAWN AEROBIC ONLY Blood Culture results may not be optimal due to an inadequate volume of blood received in culture bottles   Culture   Final    NO GROWTH 1 DAY Performed at [facility], [address], [place], [place] [address]    Report Status PENDING  Incomplete     Serology:   Imaging: If present, new imagings (plain films, ct scans, and mri) have been personally visualized and interpreted; radiology reports have been reviewed. Decision making incorporated into the Impression / Recommendations.   7/28 mri brain 1. No acute intracranial abnormality. 2. Periventricular and scattered subcortical T2 hyperintensities bilaterally are mildly advanced for age. The finding is nonspecific but can be seen in the setting of chronic microvascular ischemia, a demyelinating process such as multiple sclerosis, vasculitis, complicated  migraine headaches, or as the sequelae of a prior infectious or inflammatory process.  Raymondo Band, MD [facility] [phone] pager    03/26/2023, 3:57 PM

## 2023-03-26 NOTE — Progress Notes (Signed)
Physical Therapy Treatment Patient Details Name: Kimberly [name] MRN: [id number] DOB: 31-May-1951 Today's Date: 03/26/2023   History of Present Illness Kimberly [name] is a 72 y.o. female with medical history significant of type 2 diabetes with nephropathy, depression, history of breast cancer (status post bilateral mastectomy), gastroesophageal flux disease, class I obesity, and hypothyroidism; who presented to the hospital secondary to temperature and altered mental status.  Symptoms have been present approximately since Friday (03/16/23) and worsening. Patient with recent UTI treated with Bactrim as an outpatient; however after finishing antibiotic has developed nausea vomiting and decreased oral intake.  Patient's daughter noticed some associated weight loss and signs of dehydration.  Patient expressed continue taking her meds despite no eating and drinking well.  Has not been any abdominal pain, hematuria, melena, hematochezia, focal weaknesses, chest pain, shortness of breath, coughing spells or sick contacts.  Daughter reported son fluctuating low-grade temperature for what the mom has been using Tylenol at home.    PT Comments  Patient resting in bed and agreeable to therapy, son at bedside. Pt required min assist to initiate moving LE's off edge and cues to use bed rail to assist with raising trunk upright. Verbal cues for walker management provided at start of gait and pt maintained throughout with min guard for safety and no LOB noted for ~80'. Pt completed functional LE exercises seated EOB and then ambulated short bout in room with IV and min to steady to move bed>chair. Educated pt/family on benefits of RW for mobility as pt's balance improved with bil UE support. Will continue to progress pt as able.   If plan is discharge home, recommend the following: A little help with walking and/or transfers;A little help with bathing/dressing/bathroom;Help with stairs or ramp for entrance;Assist for  transportation;Assistance with cooking/housework;Direct supervision/assist for medications management   Can travel by private vehicle     No  Equipment Recommendations  None recommended by PT    Recommendations for Other Services       Precautions / Restrictions Precautions Precautions: Fall Precaution Comments: patient reports a fall May 2024 associated with infection and delirium Restrictions Weight Bearing Restrictions: No     Mobility  Bed Mobility Overal bed mobility: Needs Assistance Bed Mobility: Supine to Sit     Supine to sit: Min assist, HOB elevated     General bed mobility comments: Min assist to raise trunk. cues to initiate bringing LE's off EOB.    Transfers Overall transfer level: Needs assistance Equipment used: Rolling walker (2 wheels) Transfers: Sit to/from Stand Sit to Stand: Min assist           General transfer comment: cues for initiating power up, min assist to steady rise and to cue for reach back to sit EOB or recliner.    Ambulation/Gait Ambulation/Gait assistance: Min guard, Min assist Gait Distance (Feet): 80 Feet Assistive device: Rolling walker (2 wheels), IV Pole Gait Pattern/deviations: Step-through pattern, Decreased step length - right, Decreased step length - left, Decreased stride length, Wide base of support, Trunk flexed Gait velocity: decr     General Gait Details: pt required cues at start to maintain safe proximity to RW. overall slow but steady pace with RW and min guard for safety. Pt amb short bout in room with IV pole and min assist required to steady balance to move around foot of bed to recliner.   Stairs             Psychologist, prison and probation services  Tilt Bed    Modified Rankin (Stroke Patients Only)       Balance Overall balance assessment: Needs assistance Sitting-balance support: Feet supported, Bilateral upper extremity supported Sitting balance-Leahy Scale: Good                                       Cognition Arousal/Alertness: Awake/alert Behavior During Therapy: WFL for tasks assessed/performed Overall Cognitive Status: Impaired/Different from baseline Area of Impairment: Orientation, Attention, Memory, Safety/judgement, Following commands, Awareness, Problem solving                 Orientation Level: Disoriented to, Situation, Place, Time Current Attention Level: Sustained Memory: Decreased recall of precautions, Decreased short-term memory Following Commands: Follows one step commands inconsistently, Follows multi-step commands inconsistently Safety/Judgement: Decreased awareness of safety, Decreased awareness of deficits Awareness: Intellectual Problem Solving: Slow processing, Decreased initiation, Difficulty sequencing, Requires verbal cues          Exercises General Exercises - Lower Extremity Ankle Circles/Pumps: AROM, Both, 10 reps Long Arc Quad: AROM, Both, 10 reps Mini-Sqauts: AROM, Both, 10 reps, Limitations Mini Squats Limitations: 10x sit<>stand no UE use    General Comments        Pertinent Vitals/Pain Pain Assessment Pain Assessment: No/denies pain    Home Living                          Prior Function            PT Goals (current goals can now be found in the care plan section) Acute Rehab PT Goals Patient Stated Goal: Go home with family to help PT Goal Formulation: With patient/family Time For Goal Achievement: 04/06/23 Potential to Achieve Goals: Good Progress towards PT goals: Progressing toward goals    Frequency    Min 3X/week      PT Plan Current plan remains appropriate    Co-evaluation              AM-PAC PT "6 Clicks" Mobility   Outcome Measure  Help needed turning from your back to your side while in a flat bed without using bedrails?: A Little Help needed moving from lying on your back to sitting on the side of a flat bed without using bedrails?: A Little Help needed moving  to and from a bed to a chair (including a wheelchair)?: A Little Help needed standing up from a chair using your arms (e.g., wheelchair or bedside chair)?: A Little Help needed to walk in hospital room?: A Little Help needed climbing 3-5 steps with a railing? : A Lot 6 Click Score: 17    End of Session Equipment Utilized During Treatment: Gait belt Activity Tolerance: Patient tolerated treatment well Patient left: in chair;with call bell/phone within reach;with chair alarm set;with family/visitor present Nurse Communication: Mobility status PT Visit Diagnosis: Unsteadiness on feet (R26.81);History of falling (Z91.81);Other abnormalities of gait and mobility (R26.89)     Time: 2956-2130 PT Time Calculation (min) (ACUTE ONLY): 29 min  Charges:    $Gait Training: 8-22 mins $Therapeutic Activity: 8-22 mins PT General Charges $$ ACUTE PT VISIT: 1 Visit                     Wynn Maudlin, DPT Acute Rehabilitation Services Office [phone]  03/26/23 2:42 PM

## 2023-03-26 NOTE — Plan of Care (Signed)

## 2023-03-26 NOTE — Consult Note (Signed)
Neurology Consultation  Reason for Consult: AMS Referring Physician: Dr. Lowell Guitar  CC: AMS and fever  History is obtained from:medical record and son at the bedside   HPI: Kimberly [name] is a 72 y.o. female with past medical history of anxiety and depression, hx of breast cancer, HTN, DM, GERD, hypothyroidism who presented to the hospital on 03/19/2023 for altered mental status and fevers. Per chart review symptoms started on 7/19 and worsened.  She was recently admitted 5/21-5/24 for fever, UTI and encephalopathy. LP was not done and not concerning for meningitis or encephalitis.  Since this admission she has had 2 UTI's the last one being on 7/12 and was given Bactrim. Per report, after finishing abx she developed nausea and vomiting with decreased oral intake.  WBC on admission 7.7-6.9-4.9-3.4-3.9-3.0-3.2-4.3. She has been afebrile for the last 2 days.  LP was done under fluoro with 1 cc obtained - ME panel negative.    ROS: Unable to obtain due to altered mental status.   Past Medical History:  Diagnosis Date   Anxiety    Arthritis    Depression    Diabetes mellitus    Family history of breast cancer    Family history of colon cancer    Family history of prostate cancer    Family history of skin cancer    GERD (gastroesophageal reflux disease)    High blood pressure    Hypothyroid    Obesity    PONV (postoperative nausea and vomiting)      Family History  Problem Relation Age of Onset   Diabetes Mother    Hypertension Mother    Congestive Heart Failure Father    Hypertension Father    Skin cancer Father 46       squamous cell on lip; metastatic   Hypertension Brother    Skin cancer Brother        squamous cell   Breast cancer Paternal Aunt        dx 49s d. 85s   Prostate cancer Paternal Uncle        dx 80s   Colon cancer Maternal Grandmother        dx 24s d. 71s   Diabetes Other    Cancer Cousin        unk type; possibly pancreatic     Social History:    reports that she has never smoked. She has never used smokeless tobacco. She reports that she does not drink alcohol and does not use drugs.  Medications  Current Facility-Administered Medications:    acetaminophen (TYLENOL) tablet 650 mg, 650 mg, Oral, Q6H PRN, 650 mg at 03/24/23 2210 **OR** acetaminophen (TYLENOL) suppository 650 mg, 650 mg, Rectal, Q6H PRN, Vassie Loll, MD, 650 mg at 03/24/23 0530   alum & mag hydroxide-simeth (MAALOX/MYLANTA) 200-200-20 MG/5ML suspension 30 mL, 30 mL, Oral, Q4H PRN, Vassie Loll, MD, 30 mL at 03/20/23 0048   anastrozole (ARIMIDEX) tablet 1 mg, 1 mg, Oral, Daily, Vassie Loll, MD, 1 mg at 03/26/23 1207   famotidine (PEPCID) tablet 20 mg, 20 mg, Oral, QHS, Vassie Loll, MD, 20 mg at 03/25/23 2253   feeding supplement (ENSURE ENLIVE / ENSURE PLUS) liquid 237 mL, 237 mL, Oral, BID BM, Vassie Loll, MD, 237 mL at 03/25/23 1036   gabapentin (NEURONTIN) capsule 100 mg, 100 mg, Oral, TID, Vassie Loll, MD, 100 mg at 03/26/23 0910   heparin injection 5,000 Units, 5,000 Units, Subcutaneous, Q8H, Vassie Loll, MD, 5,000 Units at 03/26/23 [phone]  insulin aspart (novoLOG) injection 0-5 Units, 0-5 Units, Subcutaneous, QHS, Vassie Loll, MD   insulin aspart (novoLOG) injection 0-9 Units, 0-9 Units, Subcutaneous, TID WC, Vassie Loll, MD, 1 Units at 03/25/23 1236   insulin glargine-yfgn (SEMGLEE) injection 5 Units, 5 Units, Subcutaneous, QHS, Vassie Loll, MD, 5 Units at 03/25/23 2253   levothyroxine (SYNTHROID) tablet 125 mcg, 125 mcg, Oral, QAC breakfast, Vassie Loll, MD, 125 mcg at 03/26/23 9147   magnesium sulfate IVPB 4 g 100 mL, 4 g, Intravenous, Once, Zigmund Daniel., MD, Last Rate: 50 mL/hr at 03/26/23 1202, 4 g at 03/26/23 1202   metoprolol succinate (TOPROL-XL) 24 hr tablet 100 mg, 100 mg, Oral, Daily, Vassie Loll, MD, 100 mg at 03/26/23 0910   mirabegron ER (MYRBETRIQ) tablet 25 mg, 25 mg, Oral, QPM, Vassie Loll, MD, 25 mg at  03/25/23 1709   multivitamin with minerals tablet 1 tablet, 1 tablet, Oral, Daily, Vassie Loll, MD, 1 tablet at 03/26/23 0910   ondansetron (ZOFRAN) tablet 4 mg, 4 mg, Oral, Q6H PRN **OR** ondansetron (ZOFRAN) injection 4 mg, 4 mg, Intravenous, Q6H PRN, Vassie Loll, MD   Oral care mouth rinse, 15 mL, Mouth Rinse, PRN, Vassie Loll, MD   pantoprazole (PROTONIX) EC tablet 40 mg, 40 mg, Oral, Daily, Vassie Loll, MD, 40 mg at 03/26/23 0910   traZODone (DESYREL) tablet 100 mg, 100 mg, Oral, QHS PRN, Mariea Clonts, Courage, MD, 100 mg at 03/24/23 2211   venlafaxine XR (EFFEXOR-XR) 24 hr capsule 75 mg, 75 mg, Oral, QHS, Vassie Loll, MD, 75 mg at 03/25/23 2253  Facility-Administered Medications Ordered in Other Encounters:    bupivacaine liposome (EXPAREL) 1.3 % injection 266 mg, 20 mL, Infiltration, Once, Vickki Hearing, MD   Exam: Current vital signs: BP (!) 135/93 (BP Location: Right Arm)   Pulse 74   Temp 98 F (36.7 C)   Resp 18   Ht 5\' 3"  (1.6 m)   Wt 86.7 kg   SpO2 99%   BMI 33.86 kg/m  Vital signs in last 24 hours: Temp:  [97.8 F (36.6 C)-98.4 F (36.9 C)] 98 F (36.7 C) (07/29 0827) Pulse Rate:  [74-89] 74 (07/29 0827) Resp:  [12-18] 18 (07/29 0827) BP: (124-148)/(64-93) 135/93 (07/29 0827) SpO2:  [98 %-100 %] 99 % (07/29 0827) Weight:  [86.7 kg] 86.7 kg (07/29 0338)  GENERAL: Awake, alert in NAD HEENT: - Normocephalic and atraumatic, dry mm LUNGS - Clear to auscultation bilaterally with no wheezes CV - S1S2 RRR, no m/r/g, equal pulses bilaterally. ABDOMEN - Soft, nontender, nondistended with normoactive BS Ext: warm, well perfused, intact peripheral pulses, no edema  NEURO:  Mental Status: She is confused. She is oriented to self, month and year and hospital. She is unable to state her correct age or situation of why she is in the hospital  Language: speech is cleat.  Naming, repetition, fluency, and comprehension intact. Cranial Nerves: PERRL EOMI, visual  fields full, no facial asymmetry, facial sensation intact, hearing intact, tongue/uvula/soft palate midline, normal sternocleidomastoid and trapezius muscle strength. No evidence of tongue atrophy or fibrillations Motor: 5/5 in bilateral uppers, bilateral lowers 4/5 Tone: is normal and bulk is normal Sensation- Intact to light touch bilaterally Coordination: FTN intact bilaterally, no ataxia in BLE. Gait- deferred    Labs I have reviewed labs in epic and the results pertinent to this consultation are:  CBC    Component Value Date/Time   WBC 4.3 03/26/2023 0728   RBC 2.94 (L) 03/26/2023 8295  HGB 9.0 (L) 03/26/2023 0728   HGB 12.0 04/11/2021 1042   HCT 27.2 (L) 03/26/2023 0728   HCT 35.9 04/11/2021 1042   PLT 218 03/26/2023 0728   PLT 307 04/11/2021 1042   MCV 92.5 03/26/2023 0728   MCV 96 04/11/2021 1042   MCH 30.6 03/26/2023 0728   MCHC 33.1 03/26/2023 0728   RDW 14.1 03/26/2023 0728   RDW 11.8 04/11/2021 1042   LYMPHSABS 2.2 03/26/2023 0728   LYMPHSABS 2.5 11/18/2018 0831   MONOABS 0.4 03/26/2023 0728   EOSABS 0.3 03/26/2023 0728   EOSABS 0.2 11/18/2018 0831   BASOSABS 0.0 03/26/2023 0728   BASOSABS 0.1 11/18/2018 0831    CMP     Component Value Date/Time   NA 133 (L) 03/26/2023 0728   NA 138 10/03/2021 1419   K 3.9 03/26/2023 0728   CL 107 03/26/2023 0728   CO2 18 (L) 03/26/2023 0728   GLUCOSE 112 (H) 03/26/2023 0728   BUN 7 (L) 03/26/2023 0728   BUN 20 10/03/2021 1419   CREATININE 0.86 03/26/2023 0728   CREATININE 1.06 08/11/2014 0701   CALCIUM 7.6 (L) 03/26/2023 0728   CALCIUM 10.9 (H) 12/28/2022 1452   PROT 5.6 (L) 03/26/2023 0728   PROT 7.5 10/03/2021 1419   ALBUMIN 2.3 (L) 03/26/2023 0728   ALBUMIN 4.3 10/03/2021 1419   AST 47 (H) 03/26/2023 0728   ALT 41 03/26/2023 0728   ALKPHOS 60 03/26/2023 0728   BILITOT 0.5 03/26/2023 0728   BILITOT 0.3 10/03/2021 1419   GFRNONAA >60 03/26/2023 0728   GFRAA 65 10/11/2020 1146    Lipid Panel      Component Value Date/Time   CHOL 158 07/31/2022 0816   TRIG 180 (H) 07/31/2022 0816   HDL 39 (L) 07/31/2022 0816   CHOLHDL 4.1 07/31/2022 0816   CHOLHDL 3.7 06/03/2014 0709   VLDL 40 06/03/2014 0709   LDLCALC 88 07/31/2022 0816    Lab Results  Component Value Date   HGBA1C 6.4 (H) 03/19/2023      Imaging I have reviewed the images obtained:  CT-head no acute process.   MRI examination of the brain no acute process. periventricular and scattered subcortical T2 hyperintensities bilaterally   Labs Covid negative x 2 7/22 and 7/24 Resp panel negative  Bld Cx negative  Me panel negative  UA and Urine Cx negative  Chest x-ray on 03/22/2023 without acute findings   I have seen the patient reviewed the above note. On my exam, she is oriented to month, year, location.  She did have some difficulty with world backwards, but was relatively coherent in the conversation.  Assessment:  72 y.o. female with past medical history of anxiety and depression, hx of breast cancer, HTN, DM, GERD, hypothyroidism who presented to the hospital on 03/19/2023 for altered mental status and fevers.  Based on the fact that she had delirium with her previous urinary tract infection as well, my suspicion is that she is predisposed to delirium.  In that setting, physiological stressors could certainly contribute to recurrent delirium, and my suspicion is that is what is happening here.  With her improving, I think that infectious encephalitis is very unlikely, and therefore I think that the negative ME panel ruling out herpes and VZV may be adequate as long as she continues to improve.  Other infectious encephalitides would not have specific treatments, and I think infectious meningitis is exceedingly unlikely given the lack of headache or meningeal signs.  Neurology will continue to follow,  and I would defer to infectious disease for further workup of fever source.  Ritta Slot, MD [facility] [phone]  If 7pm- 7am, please page neurology on call as listed in [facility].

## 2023-03-26 NOTE — Progress Notes (Signed)
PROGRESS NOTE    Kimberly BOCHENEK  AOZ:[id number] DOB: 1951/07/20 DOA: 03/19/2023 PCP: Tommie Sams, DO  Chief Complaint  Patient presents with   Altered Mental Status   low grade fever    Brief Narrative:    Kimberly [name] is Kimberly [name] 72 y.o. female with medical history significant of type 2 diabetes with nephropathy, depression, history of breast cancer (status post bilateral mastectomy), gastroesophageal flux disease, class I obesity, and hypothyroidism; who presented to the hospital secondary to temperature and altered mental status.  Symptoms have been present approximately since Friday (03/16/23) and worsening. Patient with recent UTI treated with Bactrim as an outpatient; however after finishing antibiotic has developed nausea vomiting and decreased oral intake.  -Admitted on 03/19/2023 with concerns for hyponatremia/dehydration and AKI with altered mentation -She has continued to have altered mentation and fevers similar to her presentation back in May 2024 -transferred on 03/24/2023 to [facility] for possible lumbar puncture and further infectious disease evaluation (Dr. Rutha Bouchard)  Assessment & Plan:   Principal Problem:   Acute metabolic encephalopathy Active Problems:   Essential hypertension, benign   Hypothyroidism   Depression   Nonalcoholic steatohepatitis (NASH)   Breast cancer of upper-outer quadrant of right female breast (HCC)   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)   Mixed hyperlipidemia   Hyponatremia   Class 1 obesity  1)Acute metabolic encephalopathy--multifactorial - suspect related to fever process below  - delirium precautions -TSH low (follow free T4 - mildly elevated) and B12 low normal (follow MMA) -CT head without acute intracranial abnormalities. -MRI with periventricular and scattered subcortical T2 hyperintensities bilaterally -now s/p LP (see below) - appreciate neurology consult   2)Persistent Fevers-- -COVID test negative  on 03/19/2023, repeat COVID test negative again on 03/21/2023 - 7/23 urine culture with staph epidermidis - 7/27 (7/24 and 7/26) blood cultures pending (NGTD from 7/22) - RVP negative - She was treated with Bactrim for 1 week prior to admission for E. coli UTI based on sensitivities as per  urine culture from 03/09/2023 - Patient received IV Rocephin on 03/22/2023 - Patient received Cipro x 3 doses from 03/22/2023 to 03/23/2023 - Chest x-ray on 03/22/2023 without acute findings, CT abdomen/pelvis without acute abnormality -  appreciate ID assistance -> RPR negative, FTA (pending), HIV negative, CSF arbovirus, ME PCR - negative, glucose, protein, cell count, culture, Q fever serology - pending, chlamydia psittaci serology ([facility] send out), arbovirus panel pending, brain MRI (CSF studies limited with 1 cc collected) - if ME panel PCR negative, discuss with neurology - appreciate assistance  3)Hypophosphatemia/hyponatremia and hypokalemia----due to poor oral intake/dehydration and diuretic use prior to admission - hydrochlorothiazide and losartan on hold - improved with IVF   Abnormal Thyroid Function test Repeat outpatient  Class 1 obesity -Body mass index is 31.51 kg/m.   Mixed hyperlipidemia -continue to follow lipid panel as an outpatient -continue low fat/heart healthy diet   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) -A1c 6.4 -Continue to hold oral hypoglycemic agents while inpatient -Continue sliding scale insulin and Semglee.   Breast cancer of upper-outer quadrant of right female breast (HCC)-status post right-sided mastectomy -Continue outpatient follow-up with oncology service -Continue  Arimidex.   Nonalcoholic steatohepatitis (NASH) -Stable LFTs -Continue outpatient follow-up with gastroenterology service.   Depression -No suicidal ideation or hallucinations -Continue home antihypertensive agents.   Hypothyroidism -Continue Synthroid.   Essential hypertension,  benign - holding HCTZ and losartan -Continue metoprolol -Avoid hypotension.  DVT prophylaxis: heparin Code Status: full Family Communication: daughter Disposition:   Status is: Inpatient Remains inpatient appropriate because: need for continued inpatient care   Consultants:  IR ID  Procedures:  LP IMPRESSION: 1. Fluoroscopically-guided L4-L5 lumbar puncture. 2. Opening pressure: 12 cm water. 3. Despite applying significant table tilt, only 1 mL of CSF could be collected for laboratory studies. This could potentially be due to patient dehydration. 4. No immediate post-procedure complication.  Antimicrobials:  Anti-infectives (From admission, onward)    Start     Dose/Rate Route Frequency Ordered Stop   03/24/23 1700  acyclovir (ZOVIRAX) 635 mg in dextrose 5 % 100 mL IVPB  Status:  Discontinued        10 mg/kg  63.7 kg (Adjusted) 112.7 mL/hr over 60 Minutes Intravenous Every 8 hours 03/24/23 1540 03/25/23 1755   03/22/23 1500  ciprofloxacin (CIPRO) IVPB 400 mg  Status:  Discontinued        400 mg 200 mL/hr over 60 Minutes Intravenous Every 12 hours 03/22/23 1400 03/24/23 1015   03/22/23 0915  cefTRIAXone (ROCEPHIN) 2 g in sodium chloride 0.9 % 100 mL IVPB  Status:  Discontinued        2 g 200 mL/hr over 30 Minutes Intravenous Every 24 hours 03/22/23 0823 03/22/23 1646       Subjective: Less confused today Son at bedside Sleepy today, otherwise no complaints  Objective: Vitals:   03/25/23 1630 03/25/23 2300 03/26/23 0338 03/26/23 0827  BP: 129/72 134/78 (!) 148/69 (!) 135/93  Pulse: 75 89 74 74  Resp: 12  18 18   Temp: 97.9 F (36.6 C) 97.8 F (36.6 C) 97.9 F (36.6 C) 98 F (36.7 C)  TempSrc: Oral Oral Oral   SpO2: 100% 99% 98% 99%  Weight:   86.7 kg   Height:        Intake/Output Summary (Last 24 hours) at 03/26/2023 1036 Last data filed at 03/26/2023 0200 Gross per 24 hour  Intake 120 ml  Output 700 ml  Net -580 ml   Filed Weights    03/19/23 1609 03/25/23 0337 03/26/23 0338  Weight: 80.7 kg 84.5 kg 86.7 kg    Examination:  General: No acute distress. Cardiovascular: RRR Lungs: Clear to auscultation bilaterally  Abdomen: Soft, nontender, nondistended  Neurological: Alert and oriented 3, but sleepy, falls asleep during our conversation. Moves all extremities 4. Cranial nerves II through XII grossly intact. Extremities: No clubbing or cyanosis. No edema.     Data Reviewed: I have personally reviewed following labs and imaging studies  CBC: Recent Labs  Lab 03/23/23 2208 03/24/23 0501 03/24/23 0854 03/25/23 1052 03/26/23 0728  WBC 3.4* 3.9* 3.0* 3.2* 4.3  NEUTROABS 2.0  --  2.3 1.0* PENDING  HGB 9.3* 9.6* 8.9* 8.6* 9.0*  HCT 26.6* 28.5* 26.0* 25.9* 27.2*  MCV 90.8 92.8 91.5 93.2 92.5  PLT 132* 134* 131* 157 218    Basic Metabolic Panel: Recent Labs  Lab 03/20/23 0412 03/21/23 0344 03/22/23 0839 03/23/23 0430 03/23/23 2208 03/24/23 0128 03/24/23 0501 03/24/23 0854 03/25/23 1052 03/26/23 0728  NA 129*   < > 126* 125*   < > 126* 125* 126* 130* 133*  K 3.9   < > 3.5 3.7   < > 3.6 3.5 3.3* 4.5 3.9  CL 102   < > 100 100   < > 100 99 101 104 107  CO2 17*   < > 19* 17*   < > 17* 19* 19* 18* 18*  GLUCOSE  79   < > 158* 127*   < > 142* 157* 140* 133* 112*  BUN 29*   < > 26* 20   < > 18 16 15 11  7*  CREATININE 1.22*   < > 1.14* 0.94   < > 0.92 0.89 0.90 0.91 0.86  CALCIUM 8.8*   < > 7.8* 7.0*   < > 7.3* 7.3* 7.1* 7.5* 7.6*  MG 1.8  --   --   --   --   --   --   --   --  1.3*  PHOS  --   --  1.7* 2.7  --   --   --   --   --  2.7   < > = values in this interval not displayed.    GFR: Estimated Creatinine Clearance: 62.6 mL/min (by C-G formula based on SCr of 0.86 mg/dL).  Liver Function Tests: Recent Labs  Lab 03/22/23 0839 03/23/23 0430 03/24/23 0854 03/25/23 1052 03/26/23 0728  AST  --   --  40 66* 47*  ALT  --   --  29 46* 41  ALKPHOS  --   --  59 63 60  BILITOT  --   --  1.2 0.5 0.5   PROT  --   --  5.5* 5.9* 5.6*  ALBUMIN 2.8* 2.3* 2.3* 2.2* 2.3*    CBG: Recent Labs  Lab 03/25/23 1627 03/25/23 2201 03/26/23 0057 03/26/23 0340 03/26/23 0827  GLUCAP 100* 127* 95 102* 108*     Recent Results (from the past 240 hour(s))  SARS Coronavirus 2 by RT PCR (hospital order, performed in [facility] hospital lab) *cepheid single result test* Anterior Nasal Swab     Status: None   Collection Time: 03/19/23 10:33 AM   Specimen: Anterior Nasal Swab  Result Value Ref Range Status   SARS Coronavirus 2 by RT PCR NEGATIVE NEGATIVE Final    Comment: (NOTE) SARS-CoV-2 target nucleic acids are NOT DETECTED.  The SARS-CoV-2 RNA is generally detectable in upper and lower respiratory specimens during the acute phase of infection. The lowest concentration of SARS-CoV-2 viral copies this assay can detect is 250 copies / mL. Kavita Bartl negative result does not preclude SARS-CoV-2 infection and should not be used as the sole basis for treatment or other patient management decisions.  Ezzard Ditmer negative result may occur with improper specimen collection / handling, submission of specimen other than nasopharyngeal swab, presence of viral mutation(s) within the areas targeted by this assay, and inadequate number of viral copies (<250 copies / mL). Candie Gintz negative result must be combined with clinical observations, patient history, and epidemiological information.  Fact Sheet for Patients:   RoadLapTop.co.za  Fact Sheet for Healthcare Providers: http://kim-miller.com/  This test is not yet approved or  cleared by the [place] FDA and has been authorized for detection and/or diagnosis of SARS-CoV-2 by FDA under an Emergency Use Authorization (EUA).  This EUA will remain in effect (meaning this test can be used) for the duration of the COVID-19 declaration under Section 564(b)(1) of the Act, 21 U.S.C. section 360bbb-3(b)(1), unless the authorization is  terminated or revoked sooner.  Performed at [facility], [address], [place], [place] [address]   Blood Culture (routine x 2)     Status: None   Collection Time: 03/19/23 10:36 AM   Specimen: BLOOD  Result Value Ref Range Status   Specimen Description BLOOD BLOOD LEFT ARM  Final   Special Requests   Final  BOTTLES DRAWN AEROBIC AND ANAEROBIC Blood Culture adequate volume   Culture   Final    NO GROWTH 5 DAYS Performed at [facility], [address] [place], [place] [address]    Report Status 03/24/2023 FINAL  Final  Blood Culture (routine x 2)     Status: None   Collection Time: 03/19/23 10:36 AM   Specimen: BLOOD  Result Value Ref Range Status   Specimen Description BLOOD BLOOD LEFT ARM  Final   Special Requests   Final    BOTTLES DRAWN AEROBIC AND ANAEROBIC Blood Culture adequate volume   Culture   Final    NO GROWTH 5 DAYS Performed at [facility], [address], [place], [place] [address]    Report Status 03/24/2023 FINAL  Final  Urine Culture (for pregnant, neutropenic or urologic patients or patients with an indwelling urinary catheter)     Status: Abnormal   Collection Time: 03/20/23  4:50 PM   Specimen: Urine, Clean Catch  Result Value Ref Range Status   Specimen Description   Final    URINE, CLEAN CATCH Performed at [facility], [address] [place], [place] [address]    Special Requests   Final    NONE Performed at [facility], [address] [place], [place] [address]    Culture 20,000 COLONIES/mL STAPHYLOCOCCUS EPIDERMIDIS (Hollister Wessler)  Final   Report Status 03/22/2023 FINAL  Final   Organism ID, Bacteria STAPHYLOCOCCUS EPIDERMIDIS (Suede Greenawalt)  Final      Susceptibility   Staphylococcus epidermidis - MIC*    CIPROFLOXACIN <=0.5 SENSITIVE Sensitive     GENTAMICIN <=0.5 SENSITIVE Sensitive     NITROFURANTOIN <=16 SENSITIVE Sensitive     OXACILLIN >=4 RESISTANT Resistant     TETRACYCLINE 2 SENSITIVE Sensitive     VANCOMYCIN 2 SENSITIVE Sensitive      TRIMETH/SULFA 80 RESISTANT Resistant     CLINDAMYCIN <=0.25 SENSITIVE Sensitive     RIFAMPIN <=0.5 SENSITIVE Sensitive     Inducible Clindamycin NEGATIVE Sensitive     * 20,000 COLONIES/mL STAPHYLOCOCCUS EPIDERMIDIS  Culture, blood (Routine X 2) w Reflex to ID Panel     Status: None   Collection Time: 03/21/23  1:34 PM   Specimen: BLOOD  Result Value Ref Range Status   Specimen Description BLOOD BLOOD LEFT HAND  Final   Special Requests   Final    BOTTLES DRAWN AEROBIC AND ANAEROBIC Blood Culture adequate volume   Culture   Final    NO GROWTH 5 DAYS Performed at [facility], [address] [place], [place] [address]    Report Status 03/26/2023 FINAL  Final  SARS Coronavirus 2 by RT PCR (hospital order, performed in [facility] hospital lab) *cepheid single result test* Anterior Nasal Swab     Status: None   Collection Time: 03/21/23  1:35 PM   Specimen: Anterior Nasal Swab  Result Value Ref Range Status   SARS Coronavirus 2 by RT PCR NEGATIVE NEGATIVE Final    Comment: (NOTE) SARS-CoV-2 target nucleic acids are NOT DETECTED.  The SARS-CoV-2 RNA is generally detectable in upper and lower respiratory specimens during the acute phase of infection. The lowest concentration of SARS-CoV-2 viral copies this assay can detect is 250 copies / mL. Jillann Charette negative result does not preclude SARS-CoV-2 infection and should not be used as the sole basis for treatment or other patient management decisions.  Donnamaria Shands negative result may occur with improper specimen collection / handling, submission of specimen other than nasopharyngeal swab, presence of  viral mutation(s) within the areas targeted by this assay, and inadequate number of viral copies (<250 copies / mL). Shaylynne Lunt negative result must be combined with clinical observations, patient history, and epidemiological information.  Fact Sheet for Patients:   RoadLapTop.co.za  Fact Sheet for Healthcare  Providers: http://kim-miller.com/  This test is not yet approved or  cleared by the [place] FDA and has been authorized for detection and/or diagnosis of SARS-CoV-2 by FDA under an Emergency Use Authorization (EUA).  This EUA will remain in effect (meaning this test can be used) for the duration of the COVID-19 declaration under Section 564(b)(1) of the Act, 21 U.S.C. section 360bbb-3(b)(1), unless the authorization is terminated or revoked sooner.  Performed at [facility], [address], [place], [place] [address]   Culture, blood (Routine X 2) w Reflex to ID Panel     Status: None   Collection Time: 03/21/23  1:38 PM   Specimen: BLOOD  Result Value Ref Range Status   Specimen Description BLOOD BLOOD LEFT ARM  Final   Special Requests   Final    BOTTLES DRAWN AEROBIC AND ANAEROBIC Blood Culture adequate volume   Culture   Final    NO GROWTH 5 DAYS Performed at [facility], [address] [place], [place] [address]    Report Status 03/26/2023 FINAL  Final  Culture, blood (Routine X 2) w Reflex to ID Panel     Status: None (Preliminary result)   Collection Time: 03/23/23 10:08 PM   Specimen: Left Antecubital; Blood  Result Value Ref Range Status   Specimen Description LEFT ANTECUBITAL  Final   Special Requests   Final    BOTTLES DRAWN AEROBIC AND ANAEROBIC Blood Culture results may not be optimal due to an excessive volume of blood received in culture bottles   Culture   Final    NO GROWTH 3 DAYS Performed at [facility], [address], [place], [place] [address]    Report Status PENDING  Incomplete  Culture, blood (Routine X 2) w Reflex to ID Panel     Status: None (Preliminary result)   Collection Time: 03/23/23 10:25 PM   Specimen: BLOOD LEFT HAND  Result Value Ref Range Status   Specimen Description BLOOD LEFT HAND  Final   Special Requests   Final    BOTTLES DRAWN AEROBIC ONLY Blood Culture adequate volume   Culture   Final    NO GROWTH 3  DAYS Performed at [facility], [address] [place], [place] [address]    Report Status PENDING  Incomplete  Culture, blood (Routine X 2)     Status: None (Preliminary result)   Collection Time: 03/24/23  8:54 AM   Specimen: BLOOD LEFT HAND  Result Value Ref Range Status   Specimen Description   Final    BLOOD LEFT HAND BOTTLES DRAWN AEROBIC AND ANAEROBIC   Special Requests Blood Culture adequate volume  Final   Culture   Final    NO GROWTH 2 DAYS Performed at [facility], [address] [place], [place] [address]    Report Status PENDING  Incomplete  Culture, blood (Routine X 2)     Status: None (Preliminary result)   Collection Time: 03/24/23  8:54 AM   Specimen: BLOOD LEFT FOREARM  Result Value Ref Range Status   Specimen Description   Final    BLOOD LEFT FOREARM BOTTLES DRAWN AEROBIC AND ANAEROBIC   Special Requests Blood Culture adequate volume  Final   Culture  Final    NO GROWTH 2 DAYS Performed at [facility], [address] [place], [place] [address]    Report Status PENDING  Incomplete  Respiratory (~20 pathogens) panel by PCR     Status: None   Collection Time: 03/24/23 10:28 AM   Specimen: Nasopharyngeal Swab; Respiratory  Result Value Ref Range Status   Adenovirus NOT DETECTED NOT DETECTED Final   Coronavirus 229E NOT DETECTED NOT DETECTED Final    Comment: (NOTE) The Coronavirus on the Respiratory Panel, DOES NOT test for the novel  Coronavirus (2019 nCoV)    Coronavirus HKU1 NOT DETECTED NOT DETECTED Final   Coronavirus NL63 NOT DETECTED NOT DETECTED Final   Coronavirus OC43 NOT DETECTED NOT DETECTED Final   Metapneumovirus NOT DETECTED NOT DETECTED Final   Rhinovirus / Enterovirus NOT DETECTED NOT DETECTED Final   Influenza Sway Guttierrez NOT DETECTED NOT DETECTED Final   Influenza B NOT DETECTED NOT DETECTED Final   Parainfluenza Virus 1 NOT DETECTED NOT DETECTED Final   Parainfluenza Virus 2 NOT DETECTED NOT DETECTED Final   Parainfluenza Virus 3 NOT DETECTED  NOT DETECTED Final   Parainfluenza Virus 4 NOT DETECTED NOT DETECTED Final   Respiratory Syncytial Virus NOT DETECTED NOT DETECTED Final   Bordetella pertussis NOT DETECTED NOT DETECTED Final   Bordetella Parapertussis NOT DETECTED NOT DETECTED Final   Chlamydophila pneumoniae NOT DETECTED NOT DETECTED Final   Mycoplasma pneumoniae NOT DETECTED NOT DETECTED Final    Comment: Performed at [facility], [address], [place], [place] [address]         Radiology Studies: MR BRAIN W WO CONTRAST  Result Date: 03/25/2023 CLINICAL DATA:  Mental status change. Unknown cause. Febrile syndrome. Acute metabolic encephalopathy. EXAM: MRI HEAD WITHOUT AND WITH CONTRAST TECHNIQUE: Multiplanar, multiecho pulse sequences of the brain and surrounding structures were obtained without and with intravenous contrast. CONTRAST:  9mL GADAVIST GADOBUTROL 1 MMOL/ML IV SOLN COMPARISON:  CT head without contrast 03/24/2023 FINDINGS: Brain: No acute infarct, hemorrhage, or mass lesion is present. Periventricular and scattered subcortical T2 hyperintensities are mildly advanced for age. Deep brain nuclei are within normal limits. The ventricles are of normal size. No significant extraaxial fluid collection is present. The brainstem and cerebellum are within normal limits. The internal auditory canals are within normal limits. Midline structures are within normal limits. Postcontrast images demonstrate no pathologic enhancement. Vascular: Flow is present in the major intracranial arteries. Skull and upper cervical spine: The craniocervical junction is normal. Upper cervical spine is within normal limits. Marrow signal is unremarkable. Sinuses/Orbits: Mild mucosal thickening is present in the maxillary sinuses and ethmoid air cells bilaterally. Small mastoid effusions are present. No obstructing nasopharyngeal lesion is present. The globes and orbits are within normal limits. IMPRESSION: 1. No acute intracranial  abnormality. 2. Periventricular and scattered subcortical T2 hyperintensities bilaterally are mildly advanced for age. The finding is nonspecific but can be seen in the setting of chronic microvascular ischemia, Joselynne Killam demyelinating process such as multiple sclerosis, vasculitis, complicated migraine headaches, or as the sequelae of Zackarey Holleman prior infectious or inflammatory process. Electronically Signed   By: Marin Roberts M.D.   On: 03/25/2023 19:57   DG FL GUIDED LUMBAR PUNCTURE  Result Date: 03/25/2023 CLINICAL DATA:  Confusion. Altered mental status. Fever. Request received for fluoroscopically-guided diagnostic lumbar puncture. EXAM: DIAGNOSTIC LUMBAR PUNCTURE UNDER FLUOROSCOPIC GUIDANCE COMPARISON:  Head CT from 03/24/2023 reviewed prior to the procedure. FLUOROSCOPY: Radiation Exposure Index (as provided by the fluoroscopic device): 27.50 mGy Kerma  PROCEDURE: The patient was positioned prone on the fluoroscopy table. An appropriate skin entry site was determined under fluoroscopy and marked. The operated donned sterile gloves and Vera Wishart mask. The lower back was prepped and draped in the usual sterile fashion. Local anesthesia was provided with 1% lidocaine. Under intermittent fluoroscopy, lumbar puncture was performed at the L4-L5 level using Candee Hoon 20 gauge spinal needle. Initially, there was return of faintly blood-tinged CSF. There was an opening pressure of 12 cm water. The discoloration of the CSF quickly cleared as CSF exited the spinal needle and entered the collection tubing. Despite applying significant table tilt, only 1 mL of CSF could be collected for laboratory studies. The inner stylet was replaced within the needle and the needle was removed in its entirety. The patient tolerated the procedure well and no immediate post-procedure complication was apparent. The procedure was performed by Alwyn Ren, NP, supervised and briefly assisted by Dr. Jackey Loge. IMPRESSION: 1. Fluoroscopically-guided L4-L5  lumbar puncture. 2. Opening pressure: 12 cm water. 3. Despite applying significant table tilt, only 1 mL of CSF could be collected for laboratory studies. This could potentially be due to patient dehydration. 4. No immediate post-procedure complication. Electronically Signed   By: Jackey Loge D.O.   On: 03/25/2023 14:10        Scheduled Meds:  anastrozole  1 mg Oral Daily   famotidine  20 mg Oral QHS   feeding supplement  237 mL Oral BID BM   gabapentin  100 mg Oral TID   heparin  5,000 Units Subcutaneous Q8H   insulin aspart  0-5 Units Subcutaneous QHS   insulin aspart  0-9 Units Subcutaneous TID WC   insulin glargine-yfgn  5 Units Subcutaneous QHS   levothyroxine  125 mcg Oral QAC breakfast   metoprolol succinate  100 mg Oral Daily   mirabegron ER  25 mg Oral QPM   multivitamin with minerals  1 tablet Oral Daily   pantoprazole  40 mg Oral Daily   venlafaxine XR  75 mg Oral QHS   Continuous Infusions:  magnesium sulfate bolus IVPB       LOS: 6 days    Time spent: over 30 min    Lacretia Nicks, MD [facility]   To contact the attending provider between 7A-7P or the covering provider during after hours 7P-7A, please log into the web site [email or web address] and access using universal [place] password for that web site. If you do not have the password, please call the hospital operator.  03/26/2023, 10:36 AM

## 2023-03-27 DIAGNOSIS — G0490 Encephalitis and encephalomyelitis, unspecified: Secondary | ICD-10-CM | POA: Diagnosis not present

## 2023-03-27 DIAGNOSIS — A419 Sepsis, unspecified organism: Secondary | ICD-10-CM | POA: Diagnosis not present

## 2023-03-27 DIAGNOSIS — G9341 Metabolic encephalopathy: Secondary | ICD-10-CM | POA: Diagnosis not present

## 2023-03-27 LAB — CBC WITH DIFFERENTIAL/PLATELET
Abs Immature Granulocytes: 0.02 K/uL (ref 0.00–0.07)
Basophils Absolute: 0 K/uL (ref 0.0–0.1)
Basophils Relative: 1 %
Eosinophils Absolute: 0.4 K/uL (ref 0.0–0.5)
Eosinophils Relative: 8 %
HCT: 26.9 % — ABNORMAL LOW (ref 36.0–46.0)
Hemoglobin: 9.2 g/dL — ABNORMAL LOW (ref 12.0–15.0)
Immature Granulocytes: 0 %
Lymphocytes Relative: 55 %
Lymphs Abs: 2.8 K/uL (ref 0.7–4.0)
MCH: 31.9 pg (ref 26.0–34.0)
MCHC: 34.2 g/dL (ref 30.0–36.0)
MCV: 93.4 fL (ref 80.0–100.0)
Monocytes Absolute: 0.5 K/uL (ref 0.1–1.0)
Monocytes Relative: 10 %
Neutro Abs: 1.3 K/uL — ABNORMAL LOW (ref 1.7–7.7)
Neutrophils Relative %: 26 %
Platelets: 243 K/uL (ref 150–400)
RBC: 2.88 MIL/uL — ABNORMAL LOW (ref 3.87–5.11)
RDW: 14.1 % (ref 11.5–15.5)
WBC: 5.1 K/uL (ref 4.0–10.5)
nRBC: 0 % (ref 0.0–0.2)

## 2023-03-27 LAB — GLUCOSE, CAPILLARY
Glucose-Capillary: 124 mg/dL — ABNORMAL HIGH (ref 70–99)
Glucose-Capillary: 173 mg/dL — ABNORMAL HIGH (ref 70–99)
Glucose-Capillary: 171 mg/dL — ABNORMAL HIGH (ref 70–99)
Glucose-Capillary: 142 mg/dL — ABNORMAL HIGH (ref 70–99)

## 2023-03-27 MED ORDER — GLUCERNA SHAKE PO LIQD
237.0000 mL | Freq: Three times a day (TID) | ORAL | Status: DC
  Administered 2023-03-27: 237 mL via ORAL

## 2023-03-27 NOTE — Progress Notes (Signed)
Neurology Progress Note   S:// No family at the bedside.  Patient is sitting up in the chair in no apparent distress.  Patient states that she did sleep last night some. No new neurological events overnight.  Her mentation seems to be improving she is oriented and can answer all questions appropriately at this time.  Continues to be afebrile She is wondering when she can go home  O:// Current vital signs: BP (!) 146/59   Pulse (!) 59   Temp (!) 97.4 F (36.3 C)   Resp 18   Ht 5\' 3"  (1.6 m)   Wt 86.7 kg   SpO2 100%   BMI 33.86 kg/m  Vital signs in last 24 hours: Temp:  [97.4 F (36.3 C)-98 F (36.7 C)] 97.4 F (36.3 C) (07/30 0752) Pulse Rate:  [59-74] 59 (07/30 0803) Resp:  [18] 18 (07/30 0752) BP: (134-161)/(59-77) 146/59 (07/30 0803) SpO2:  [98 %-100 %] 100 % (07/30 0752)  GENERAL: Awake, alert in NAD HEENT: - Normocephalic and atraumatic, dry mm LUNGS - Clear to auscultation bilaterally with no wheezes CV - S1S2 RRR, no m/r/g, equal pulses bilaterally. ABDOMEN - Soft, nontender, nondistended with normoactive BS Ext: warm, well perfused, intact peripheral pulses, no edema  NEURO:  Mental Status: AA&Ox3.  Able to answer all questions appropriately.  He is able to state how many quarters are in $2.75.  Unable to spell world backwards Language: speech is clear.  Naming, repetition, fluency, and comprehension intact. Cranial Nerves: PERRL ____mm/brisk. EOMI, visual fields full, no facial asymmetry, facial sensation intact, hearing intact, tongue/uvula/soft palate midline, normal sternocleidomastoid and trapezius muscle strength. No evidence of tongue atrophy or fibrillations Motor: 5/5 in bilateral uppers, 4/5 in bilateral lowers Tone: is normal and bulk is normal Sensation- Intact to light touch bilaterally Coordination: FTN intact bilaterally, no ataxia in BLE. Gait- deferred    Medications  Current Facility-Administered Medications:    acetaminophen (TYLENOL)  tablet 650 mg, 650 mg, Oral, Q6H PRN, 650 mg at 03/27/23 0319 **OR** acetaminophen (TYLENOL) suppository 650 mg, 650 mg, Rectal, Q6H PRN, Vassie Loll, MD, 650 mg at 03/24/23 0530   alum & mag hydroxide-simeth (MAALOX/MYLANTA) 200-200-20 MG/5ML suspension 30 mL, 30 mL, Oral, Q4H PRN, Vassie Loll, MD, 30 mL at 03/20/23 0048   anastrozole (ARIMIDEX) tablet 1 mg, 1 mg, Oral, Daily, Vassie Loll, MD, 1 mg at 03/26/23 1207   famotidine (PEPCID) tablet 20 mg, 20 mg, Oral, QHS, Vassie Loll, MD, 20 mg at 03/26/23 2133   feeding supplement (ENSURE ENLIVE / ENSURE PLUS) liquid 237 mL, 237 mL, Oral, BID BM, Vassie Loll, MD, 237 mL at 03/25/23 1036   gabapentin (NEURONTIN) capsule 100 mg, 100 mg, Oral, TID, Vassie Loll, MD, 100 mg at 03/26/23 2133   heparin injection 5,000 Units, 5,000 Units, Subcutaneous, Q8H, Vassie Loll, MD, 5,000 Units at 03/27/23 0522   insulin aspart (novoLOG) injection 0-5 Units, 0-5 Units, Subcutaneous, QHS, Vassie Loll, MD   insulin aspart (novoLOG) injection 0-9 Units, 0-9 Units, Subcutaneous, TID WC, Vassie Loll, MD, 1 Units at 03/27/23 0819   insulin glargine-yfgn [facility]) injection 5 Units, 5 Units, Subcutaneous, QHS, Vassie Loll, MD, 5 Units at 03/26/23 2134   levothyroxine (SYNTHROID) tablet 125 mcg, 125 mcg, Oral, QAC breakfast, Vassie Loll, MD, 125 mcg at 03/27/23 0522   metoprolol succinate (TOPROL-XL) 24 hr tablet 100 mg, 100 mg, Oral, Daily, Vassie Loll, MD, 100 mg at 03/26/23 0910   mirabegron ER (MYRBETRIQ) tablet 25 mg, 25 mg, Oral, QPM,  Vassie Loll, MD, 25 mg at 03/26/23 1729   multivitamin with minerals tablet 1 tablet, 1 tablet, Oral, Daily, Vassie Loll, MD, 1 tablet at 03/26/23 0910   ondansetron (ZOFRAN) tablet 4 mg, 4 mg, Oral, Q6H PRN **OR** ondansetron (ZOFRAN) injection 4 mg, 4 mg, Intravenous, Q6H PRN, Vassie Loll, MD   Oral care mouth rinse, 15 mL, Mouth Rinse, PRN, Vassie Loll, MD   pantoprazole (PROTONIX) EC  tablet 40 mg, 40 mg, Oral, Daily, Vassie Loll, MD, 40 mg at 03/26/23 0910   traZODone (DESYREL) tablet 100 mg, 100 mg, Oral, QHS PRN, Emokpae, Courage, MD, 100 mg at 03/24/23 2211   venlafaxine XR (EFFEXOR-XR) 24 hr capsule 75 mg, 75 mg, Oral, QHS, Vassie Loll, MD, 75 mg at 03/26/23 2133  Facility-Administered Medications Ordered in Other Encounters:    bupivacaine liposome (EXPAREL) 1.3 % injection 266 mg, 20 mL, Infiltration, Once, Vickki Hearing, MD  Labs CBC    Component Value Date/Time   WBC 5.1 03/27/2023 0049   RBC 2.88 (L) 03/27/2023 0049   HGB 9.2 (L) 03/27/2023 0049   HGB 12.0 04/11/2021 1042   HCT 26.9 (L) 03/27/2023 0049   HCT 35.9 04/11/2021 1042   PLT 243 03/27/2023 0049   PLT 307 04/11/2021 1042   MCV 93.4 03/27/2023 0049   MCV 96 04/11/2021 1042   MCH 31.9 03/27/2023 0049   MCHC 34.2 03/27/2023 0049   RDW 14.1 03/27/2023 0049   RDW 11.8 04/11/2021 1042   LYMPHSABS 2.8 03/27/2023 0049   LYMPHSABS 2.5 11/18/2018 0831   MONOABS 0.5 03/27/2023 0049   EOSABS 0.4 03/27/2023 0049   EOSABS 0.2 11/18/2018 0831   BASOSABS 0.0 03/27/2023 0049   BASOSABS 0.1 11/18/2018 0831    CMP     Component Value Date/Time   NA 134 (L) 03/27/2023 0049   NA 138 10/03/2021 1419   K 3.8 03/27/2023 0049   CL 105 03/27/2023 0049   CO2 20 (L) 03/27/2023 0049   GLUCOSE 115 (H) 03/27/2023 0049   BUN 7 (L) 03/27/2023 0049   BUN 20 10/03/2021 1419   CREATININE 0.82 03/27/2023 0049   CREATININE 1.06 08/11/2014 0701   CALCIUM 7.8 (L) 03/27/2023 0049   CALCIUM 10.9 (H) 12/28/2022 1452   PROT 5.6 (L) 03/26/2023 0728   PROT 7.5 10/03/2021 1419   ALBUMIN 2.3 (L) 03/26/2023 0728   ALBUMIN 4.3 10/03/2021 1419   AST 47 (H) 03/26/2023 0728   ALT 41 03/26/2023 0728   ALKPHOS 60 03/26/2023 0728   BILITOT 0.5 03/26/2023 0728   BILITOT 0.3 10/03/2021 1419   GFRNONAA >60 03/27/2023 0049   GFRAA 65 10/11/2020 1146    Lipid Panel     Component Value Date/Time   CHOL 158  07/31/2022 0816   TRIG 180 (H) 07/31/2022 0816   HDL 39 (L) 07/31/2022 0816   CHOLHDL 4.1 07/31/2022 0816   CHOLHDL 3.7 06/03/2014 0709   VLDL 40 06/03/2014 0709   LDLCALC 88 07/31/2022 0816    Lab Results  Component Value Date   HGBA1C 6.4 (H) 03/19/2023     Imaging I have reviewed images in epic and the results pertinent to this consultation are:  CT-head no acute process.    MRI examination of the brain no acute process. periventricular and scattered subcortical T2 hyperintensities bilaterally    Labs Covid negative x 2 7/22 and 7/24 Resp panel negative  Bld Cx negative  Me panel negative  UA and Urine Cx negative  Chest x-ray on 03/22/2023  without acute findings   Assessment:  72 y.o. female with past medical history of anxiety and depression, hx of breast cancer, HTN, DM, GERD, hypothyroidism who presented to the hospital on 03/19/2023 for altered mental status and fevers.  Based on the fact that she had delirium with her previous urinary tract infection as well, my suspicion is that she is predisposed to delirium.  In that setting, physiological stressors could certainly contribute to recurrent delirium, and my suspicion is that is what is happening here.  With her improving, I think that infectious encephalitis is very unlikely, and therefore I think that the negative ME panel ruling out herpes and VZV may be adequate as long as she continues to improve.  Other infectious encephalitides would not have specific treatments, and I think infectious meningitis is exceedingly unlikely given the lack of headache or meningeal signs.   Encephalopathy  Delirium  Recommendations: Delirium precautions   Gevena Mart DNP, ACNPC-AG  [facility]  I have seen the patient reviewed the above note.  Her mental status continues to improve.  With her history of previous episode of delirium, and recurrent confusion with fevers, my suspicion is for delirium, though difficult to  definitively rule out encephalitis given limited CSF.  At this point, the infectious etiologies of encephalitis that have specific treatments have been ruled out, and with her marked improvement, I am not sure how much benefit she would get from a repeat LP, especially given the difficulty that was had with obtaining her initial sample.  If she has confusion in the future and repeat LPs are needed, these will need to be done under fluoroscopy.  At this point, no specific further neurodiagnostic testing, neurology will be available as needed.  Ritta Slot, MD [facility] [phone]  If 7pm- 7am, please page neurology on call as listed in AM[facility].

## 2023-03-27 NOTE — Plan of Care (Signed)

## 2023-03-27 NOTE — Progress Notes (Signed)
Nutrition Follow-up  DOCUMENTATION CODES:   Obesity unspecified  INTERVENTION:  Modify supplements to:  - Glucerna Shake po TID, each supplement provides 220 kcal and 10 grams of protein  NUTRITION DIAGNOSIS:   Inadequate oral intake related to decreased appetite as evidenced by per patient/family report.  GOAL:   Patient will meet greater than or equal to 90% of their needs  MONITOR:   PO intake, Supplement acceptance, Labs, Weight trends, I & O's  REASON FOR ASSESSMENT:   Malnutrition Screening Tool    ASSESSMENT:   72 year old female with PMHx of DM type 2 with nephropathy, depression, breast cancer s/p bilateral mastectomy, GERD, hypothyroidism admitted with acute metabolic encephalopathy secondary to dehydration and hyponatremia.  Meds reviewed: pepcid, sliding scale insulin, semglee, MVI. Labs reviewed: Na low, BUN low.   The pt was sitting in bedside chair at time of assessment. Pt was eating a bagel at time of assessment. The pt reports that her appetite is improving. Pt reports that she likes the Ensure shakes but prefers Glucerna. RD will modify to Glucerna with meals. Intakes vary per chart from 0-75%. Per record, pt has been eating an average of 30% of her meals over the past 7 days. RD will continue to closely monitor PO intakes since she reports that her appetite is improving.   Diet Order:   Diet Order             Diet Carb Modified Fluid consistency: Thin; Room service appropriate? Yes  Diet effective now                   EDUCATION NEEDS:   No education needs have been identified at this time  Skin:  Skin Assessment: Reviewed RN Assessment  Last BM:  7/29  Height:   Ht Readings from Last 1 Encounters:  03/19/23 5\' 3"  (1.6 m)    Weight:   Wt Readings from Last 1 Encounters:  03/26/23 86.7 kg    Ideal Body Weight:  52.3 kg  BMI:  Body mass index is 33.86 kg/m.  Estimated Nutritional Needs:   Kcal:  1700-1900  Protein:  85-95  grams  Fluid:  1.7-1.9 L/day  Bethann Humble, RD, LDN, CNSC.

## 2023-03-27 NOTE — Progress Notes (Signed)
[facility]  Date of Admission:  03/19/2023     Abx: 7/27-28 acyclovir 7/25-27 cipro                                        7/12-22 bactrim   Assessment: 72 yo female dm2, ckd, depression, hx breast cancer s/p bilateral mastectomy, gerd, hypothryoidism admitted 7/22 with fever and confusion without clear cause   Patient had received abx bactrim for presumed uti 7/12 from urology. Ucx ecoli (appear to have had ecoli colonization of urinary tract0   Her wbc here is normal initially and now down trending along with mild thrombocytopenia. There is no gi sx/rash.   7/23 ucx 20k staph epi, not clinically significant, and not a uti pathogen 7/22, 7/24, 7/26, 7/27 bcx ngtd; 7/27 respiratory viral pcr negative   I suspect encephalitidies possibly arbovirus vs enterovirus. There is reactive lymphocytes/smudge cells on peripheral smear which could be suggestive of viral process   [place] livestocks (although she does have close by farms that do have livestock), very active with outside the house chores. She has a Engineer, petroleum. We will consider q-fever or psittacosis   She does have hx of breast cancer and previously in 12/2022 similar ams and reported uti sx. All cultures including lp and urine negative; csf without pleocytosis or other abnormality. Either improved with abx or self resolved. If no clear cause for this episode, will have to think about autoimmune/paraneoplastic process   Do not suspect tick related illness such as lyme or spotted fever process   -------------- 7/30 id assessment ME pcr negative; brain mri no acute pathology Leukopenia resolved Ams seems to resolved today 7/30 Pending arbovirus, psittacosis lab, and qfever lab   Afebrile x3 days   Neurology no further w/u planned      Plan: F/u Rpr, fta, psittacosis, qfever and arbor virus serology I will follow up with her in clinic to  discuss labs on 9/5 @ 245pm Will sign off Discussed with primary team    Principal Problem:   Acute metabolic encephalopathy Active Problems:   Essential hypertension, benign   Hypothyroidism   Depression   Nonalcoholic steatohepatitis (NASH)   Breast cancer of upper-outer quadrant of right female breast (HCC)   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)   Mixed hyperlipidemia   Encephalitis   Hyponatremia   Class 1 obesity   Sepsis (HCC)   Allergies  Allergen Reactions   Ace Inhibitors Cough    Scheduled Meds:  anastrozole  1 mg Oral Daily   famotidine  20 mg Oral QHS   feeding supplement  237 mL Oral BID BM   gabapentin  100 mg Oral TID   heparin  5,000 Units Subcutaneous Q8H   insulin aspart  0-5 Units Subcutaneous QHS   insulin aspart  0-9 Units Subcutaneous TID WC   insulin glargine-yfgn  5 Units Subcutaneous QHS   levothyroxine  125 mcg Oral QAC breakfast   metoprolol succinate  100 mg Oral Daily   mirabegron ER  25 mg Oral QPM   multivitamin with minerals  1 tablet Oral Daily   pantoprazole  40 mg Oral Daily   venlafaxine XR  75 mg Oral QHS   Continuous Infusions: PRN Meds:.acetaminophen **OR** acetaminophen, alum & mag hydroxide-simeth, ondansetron **OR** ondansetron (ZOFRAN)  IV, mouth rinse, traZODone   SUBJECTIVE: Wants to go home Husband/son in law here  No new lab result Afebrile No complaint    Review of Systems: ROS All other ROS was negative, except mentioned above     OBJECTIVE: Vitals:   03/26/23 2033 03/27/23 0634 03/27/23 0752 03/27/23 0803  BP: (!) 145/77 134/74  (!) 146/59  Pulse: 72 62 60 (!) 59  Resp: 18 18 18    Temp: 97.6 F (36.4 C) (!) 97.5 F (36.4 C) (!) 97.4 F (36.3 C)   TempSrc: Oral Oral    SpO2: 98% 100% 100%   Weight:      Height:       Body mass index is 33.86 kg/m.  Physical Exam General/constitutional: no distress, pleasant HEENT: Normocephalic, PER, Conj Clear, EOMI, Oropharynx clear Neck  supple CV: rrr no mrg Lungs: clear to auscultation, normal respiratory effort Abd: Soft, Nontender Ext: no edema Skin: No Rash Neuro: nonfocal MSK: no peripheral joint swelling/tenderness/warmth; back spines nontender     Lab Results Lab Results  Component Value Date   WBC 5.1 03/27/2023   HGB 9.2 (L) 03/27/2023   HCT 26.9 (L) 03/27/2023   MCV 93.4 03/27/2023   PLT 243 03/27/2023    Lab Results  Component Value Date   CREATININE 0.82 03/27/2023   BUN 7 (L) 03/27/2023   NA 134 (L) 03/27/2023   K 3.8 03/27/2023   CL 105 03/27/2023   CO2 20 (L) 03/27/2023    Lab Results  Component Value Date   ALT 41 03/26/2023   AST 47 (H) 03/26/2023   ALKPHOS 60 03/26/2023   BILITOT 0.5 03/26/2023      Microbiology: Recent Results (from the past 240 hour(s))  SARS Coronavirus 2 by RT PCR (hospital order, performed in [facility] hospital lab) *cepheid single result test* Anterior Nasal Swab     Status: None   Collection Time: 03/19/23 10:33 AM   Specimen: Anterior Nasal Swab  Result Value Ref Range Status   SARS Coronavirus 2 by RT PCR NEGATIVE NEGATIVE Final    Comment: (NOTE) SARS-CoV-2 target nucleic acids are NOT DETECTED.  The SARS-CoV-2 RNA is generally detectable in upper and lower respiratory specimens during the acute phase of infection. The lowest concentration of SARS-CoV-2 viral copies this assay can detect is 250 copies / mL. A negative result does not preclude SARS-CoV-2 infection and should not be used as the sole basis for treatment or other patient management decisions.  A negative result may occur with improper specimen collection / handling, submission of specimen other than nasopharyngeal swab, presence of viral mutation(s) within the areas targeted by this assay, and inadequate number of viral copies (<250 copies / mL). A negative result must be combined with clinical observations, patient history, and epidemiological information.  Fact Sheet for  Patients:   RoadLapTop.co.za  Fact Sheet for Healthcare Providers: http://kim-miller.com/  This test is not yet approved or  cleared by the [place] FDA and has been authorized for detection and/or diagnosis of SARS-CoV-2 by FDA under an Emergency Use Authorization (EUA).  This EUA will remain in effect (meaning this test can be used) for the duration of the COVID-19 declaration under Section 564(b)(1) of the Act, 21 U.S.C. section 360bbb-3(b)(1), unless the authorization is terminated or revoked sooner.  Performed at [facility], [address], [place], [place] [address]   Blood Culture (routine x 2)     Status: None   Collection Time: 03/19/23 10:36 AM  Specimen: BLOOD  Result Value Ref Range Status   Specimen Description BLOOD BLOOD LEFT ARM  Final   Special Requests   Final    BOTTLES DRAWN AEROBIC AND ANAEROBIC Blood Culture adequate volume   Culture   Final    NO GROWTH 5 DAYS Performed at [facility], [address], [place], [place] [address]    Report Status 03/24/2023 FINAL  Final  Blood Culture (routine x 2)     Status: None   Collection Time: 03/19/23 10:36 AM   Specimen: BLOOD  Result Value Ref Range Status   Specimen Description BLOOD BLOOD LEFT ARM  Final   Special Requests   Final    BOTTLES DRAWN AEROBIC AND ANAEROBIC Blood Culture adequate volume   Culture   Final    NO GROWTH 5 DAYS Performed at [facility], [address] [place], [place] [address]    Report Status 03/24/2023 FINAL  Final  Urine Culture (for pregnant, neutropenic or urologic patients or patients with an indwelling urinary catheter)     Status: Abnormal   Collection Time: 03/20/23  4:50 PM   Specimen: Urine, Clean Catch  Result Value Ref Range Status   Specimen Description   Final    URINE, CLEAN CATCH Performed at [facility], [address], [place], [place] [address]    Special Requests   Final    NONE Performed at [facility], [address], [place], [place] [address]    Culture 20,000 COLONIES/mL STAPHYLOCOCCUS EPIDERMIDIS (A)  Final   Report Status 03/22/2023 FINAL  Final   Organism ID, Bacteria STAPHYLOCOCCUS EPIDERMIDIS (A)  Final      Susceptibility   Staphylococcus epidermidis - MIC*    CIPROFLOXACIN <=0.5 SENSITIVE Sensitive     GENTAMICIN <=0.5 SENSITIVE Sensitive     NITROFURANTOIN <=16 SENSITIVE Sensitive     OXACILLIN >=4 RESISTANT Resistant     TETRACYCLINE 2 SENSITIVE Sensitive     VANCOMYCIN 2 SENSITIVE Sensitive     TRIMETH/SULFA 80 RESISTANT Resistant     CLINDAMYCIN <=0.25 SENSITIVE Sensitive     RIFAMPIN <=0.5 SENSITIVE Sensitive     Inducible Clindamycin NEGATIVE Sensitive     * 20,000 COLONIES/mL STAPHYLOCOCCUS EPIDERMIDIS  Culture, blood (Routine X 2) w Reflex to ID Panel     Status: None   Collection Time: 03/21/23  1:34 PM   Specimen: BLOOD  Result Value Ref Range Status   Specimen Description BLOOD BLOOD LEFT HAND  Final   Special Requests   Final    BOTTLES DRAWN AEROBIC AND ANAEROBIC Blood Culture adequate volume   Culture   Final    NO GROWTH 5 DAYS Performed at [facility], [address], [place], [place] [address]    Report Status 03/26/2023 FINAL  Final  SARS Coronavirus 2 by RT PCR (hospital order, performed in [facility] hospital lab) *cepheid single result test* Anterior Nasal Swab     Status: None   Collection Time: 03/21/23  1:35 PM   Specimen: Anterior Nasal Swab  Result Value Ref Range Status   SARS Coronavirus 2 by RT PCR NEGATIVE NEGATIVE Final    Comment: (NOTE) SARS-CoV-2 target nucleic acids are NOT DETECTED.  The SARS-CoV-2 RNA is generally detectable in upper and lower respiratory specimens during the acute phase of infection. The lowest concentration of SARS-CoV-2 viral copies this assay can detect is 250 copies / mL. A negative result does not preclude SARS-CoV-2 infection and should not be used as the sole basis  for treatment or other patient  management decisions.  A negative result may occur with improper specimen collection / handling, submission of specimen other than nasopharyngeal swab, presence of viral mutation(s) within the areas targeted by this assay, and inadequate number of viral copies (<250 copies / mL). A negative result must be combined with clinical observations, patient history, and epidemiological information.  Fact Sheet for Patients:   RoadLapTop.co.za  Fact Sheet for Healthcare Providers: http://kim-miller.com/  This test is not yet approved or  cleared by the [place] FDA and has been authorized for detection and/or diagnosis of SARS-CoV-2 by FDA under an Emergency Use Authorization (EUA).  This EUA will remain in effect (meaning this test can be used) for the duration of the COVID-19 declaration under Section 564(b)(1) of the Act, 21 U.S.C. section 360bbb-3(b)(1), unless the authorization is terminated or revoked sooner.  Performed at [facility], [address] [place], [place] [address]   Culture, blood (Routine X 2) w Reflex to ID Panel     Status: None   Collection Time: 03/21/23  1:38 PM   Specimen: BLOOD  Result Value Ref Range Status   Specimen Description BLOOD BLOOD LEFT ARM  Final   Special Requests   Final    BOTTLES DRAWN AEROBIC AND ANAEROBIC Blood Culture adequate volume   Culture   Final    NO GROWTH 5 DAYS Performed at [facility], [address], [place], [place] [address]    Report Status 03/26/2023 FINAL  Final  Culture, blood (Routine X 2) w Reflex to ID Panel     Status: None (Preliminary result)   Collection Time: 03/23/23 10:08 PM   Specimen: Left Antecubital; Blood  Result Value Ref Range Status   Specimen Description LEFT ANTECUBITAL  Final   Special Requests   Final    BOTTLES DRAWN AEROBIC AND ANAEROBIC Blood Culture results may not be optimal due to an excessive volume of blood received in culture bottles    Culture   Final    NO GROWTH 4 DAYS Performed at [facility], [address], [place], [place] [address]    Report Status PENDING  Incomplete  Culture, blood (Routine X 2) w Reflex to ID Panel     Status: None (Preliminary result)   Collection Time: 03/23/23 10:25 PM   Specimen: BLOOD LEFT HAND  Result Value Ref Range Status   Specimen Description BLOOD LEFT HAND  Final   Special Requests   Final    BOTTLES DRAWN AEROBIC ONLY Blood Culture adequate volume   Culture   Final    NO GROWTH 4 DAYS Performed at [facility], [address] [place], [place] [address]    Report Status PENDING  Incomplete  Culture, blood (Routine X 2)     Status: None (Preliminary result)   Collection Time: 03/24/23  8:54 AM   Specimen: BLOOD LEFT HAND  Result Value Ref Range Status   Specimen Description   Final    BLOOD LEFT HAND BOTTLES DRAWN AEROBIC AND ANAEROBIC   Special Requests Blood Culture adequate volume  Final   Culture   Final    NO GROWTH 3 DAYS Performed at [facility], [address], [place], [place] [address]    Report Status PENDING  Incomplete  Culture, blood (Routine X 2)     Status: None (Preliminary result)   Collection Time: 03/24/23  8:54 AM   Specimen: BLOOD LEFT FOREARM  Result Value Ref Range Status   Specimen Description  Final    BLOOD LEFT FOREARM BOTTLES DRAWN AEROBIC AND ANAEROBIC   Special Requests Blood Culture adequate volume  Final   Culture   Final    NO GROWTH 3 DAYS Performed at [facility], [address] [place], [place] [address]    Report Status PENDING  Incomplete  Respiratory (~20 pathogens) panel by PCR     Status: None   Collection Time: 03/24/23 10:28 AM   Specimen: Nasopharyngeal Swab; Respiratory  Result Value Ref Range Status   Adenovirus NOT DETECTED NOT DETECTED Final   Coronavirus 229E NOT DETECTED NOT DETECTED Final    Comment: (NOTE) The Coronavirus on the Respiratory Panel, DOES NOT test for the novel  Coronavirus (2019 nCoV)     Coronavirus HKU1 NOT DETECTED NOT DETECTED Final   Coronavirus NL63 NOT DETECTED NOT DETECTED Final   Coronavirus OC43 NOT DETECTED NOT DETECTED Final   Metapneumovirus NOT DETECTED NOT DETECTED Final   Rhinovirus / Enterovirus NOT DETECTED NOT DETECTED Final   Influenza A NOT DETECTED NOT DETECTED Final   Influenza B NOT DETECTED NOT DETECTED Final   Parainfluenza Virus 1 NOT DETECTED NOT DETECTED Final   Parainfluenza Virus 2 NOT DETECTED NOT DETECTED Final   Parainfluenza Virus 3 NOT DETECTED NOT DETECTED Final   Parainfluenza Virus 4 NOT DETECTED NOT DETECTED Final   Respiratory Syncytial Virus NOT DETECTED NOT DETECTED Final   Bordetella pertussis NOT DETECTED NOT DETECTED Final   Bordetella Parapertussis NOT DETECTED NOT DETECTED Final   Chlamydophila pneumoniae NOT DETECTED NOT DETECTED Final   Mycoplasma pneumoniae NOT DETECTED NOT DETECTED Final    Comment: Performed at [facility], [address] [place], [place] [address]  Culture, blood (Routine X 2)     Status: None (Preliminary result)   Collection Time: 03/25/23  9:49 AM   Specimen: BLOOD  Result Value Ref Range Status   Specimen Description BLOOD SITE NOT SPECIFIED  Final   Special Requests   Final    BOTTLES DRAWN AEROBIC AND ANAEROBIC Blood Culture adequate volume   Culture   Final    NO GROWTH 2 DAYS Performed at [facility], [address] [place], [place] [address]    Report Status PENDING  Incomplete  Culture, blood (Routine X 2)     Status: None (Preliminary result)   Collection Time: 03/25/23 10:42 AM   Specimen: BLOOD LEFT HAND  Result Value Ref Range Status   Specimen Description BLOOD LEFT HAND  Final   Special Requests   Final    BOTTLES DRAWN AEROBIC ONLY Blood Culture results may not be optimal due to an inadequate volume of blood received in culture bottles   Culture   Final    NO GROWTH 2 DAYS Performed at [facility], [address], [place], [place] [address]    Report  Status PENDING  Incomplete     Serology:   Imaging: If present, new imagings (plain films, ct scans, and mri) have been personally visualized and interpreted; radiology reports have been reviewed. Decision making incorporated into the Impression / Recommendations.   7/28 mri brain 1. No acute intracranial abnormality. 2. Periventricular and scattered subcortical T2 hyperintensities bilaterally are mildly advanced for age. The finding is nonspecific but can be seen in the setting of chronic microvascular ischemia, a demyelinating process such as multiple sclerosis, vasculitis, complicated migraine headaches, or as the sequelae of a prior infectious or inflammatory process.  Raymondo Band, MD [facility] [phone] pager    03/27/2023, 12:32 PM

## 2023-03-27 NOTE — Progress Notes (Signed)
PROGRESS NOTE    Kimberly [name]  JXB:[id number] DOB: November 21, 1950 DOA: 03/19/2023 PCP: Tommie Sams, DO  Chief Complaint  Patient presents with   Altered Mental Status   low grade fever    Brief Narrative:    Kimberly [name] is Kimberly [name] 72 y.o. female with medical history significant of type 2 diabetes with nephropathy, depression, history of breast cancer (status post bilateral mastectomy), gastroesophageal flux disease, class I obesity, and hypothyroidism; who presented to the hospital secondary to temperature and altered mental status.  Symptoms have been present approximately since Friday (03/16/23) and worsening. Patient with recent UTI treated with Bactrim as an outpatient; however after finishing antibiotic has developed nausea vomiting and decreased oral intake.   -Admitted on 03/19/2023 with concerns for hyponatremia/dehydration and AKI with altered mentation -She has continued to have altered mentation and fevers similar to her presentation back in May 2024 -transferred on 03/24/2023 to [facility] for possible lumbar puncture and further infectious disease evaluation (Dr. Rutha Bouchard)  Workup thus far unrevealing for infectious cause of her presentation.    At this point, she's gradually improving.  Therapy recommending SNF, but she would prefer not to.  Will see how she does with continued therapy as she's looks improved from yesterday.  Hopefully she'll have rapid improvement and can return with home health, otherwise family will need to discuss whether they can help support her at home or if she'll go to SNF.    Assessment & Plan:   Principal Problem:   Acute metabolic encephalopathy Active Problems:   Essential hypertension, benign   Hypothyroidism   Depression   Nonalcoholic steatohepatitis (NASH)   Breast cancer of upper-outer quadrant of right female breast (HCC)   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)   Mixed hyperlipidemia   Encephalitis    Hyponatremia   Class 1 obesity   Sepsis (HCC)  1)Acute metabolic encephalopathy--multifactorial - suspect related to fever process below  - delirium precautions -TSH low (follow free T4 - mildly elevated) and B12 low normal (follow MMA) -CT head without acute intracranial abnormalities. -MRI with periventricular and scattered subcortical T2 hyperintensities bilaterally -now s/p LP (see below) - appreciate neurology consult - suspicion is delirium related to physiologic stressors - neuro outpatient if memory issues persistent   2)Persistent Fevers-- -COVID test negative on 03/19/2023, repeat COVID test negative again on 03/21/2023 - 7/23 urine culture with staph epidermidis - 7/27 (7/24 and 7/26) blood cultures pending (NGTD from 7/22) - RVP negative - She was treated with Bactrim for 1 week prior to admission for E. coli UTI based on sensitivities as per  urine culture from 03/09/2023 - Patient received IV Rocephin on 03/22/2023 - Patient received Cipro x 3 doses from 03/22/2023 to 03/23/2023 - Chest x-ray on 03/22/2023 without acute findings, CT abdomen/pelvis without acute abnormality -  appreciate ID assistance -> RPR negative, FTA (pending), HIV negative, CSF arbovirus, ME PCR - negative, glucose, protein, cell count, culture, Q fever serology - pending, chlamydia psittaci serology negative, arbovirus panel pending, brain MRI (CSF studies limited with 1 cc collected) - blood cultures NGTDx2 - ID to follow outpatient 9/5 at 245 PM - if ME panel PCR negative, discuss with neurology - appreciate assistance  3)Hypophosphatemia/hyponatremia and hypokalemia----due to poor oral intake/dehydration and diuretic use prior to admission - hydrochlorothiazide and losartan on hold - improved with IVF  Class 1 obesity -Body mass index is 31.51 kg/m.   Mixed hyperlipidemia -continue to follow lipid panel as  an outpatient -continue low fat/heart healthy diet   Type 2 diabetes with kidney  complications (HCC) -A1c 6.4 -Continue to hold oral hypoglycemic agents while inpatient -Continue sliding scale insulin and Semglee.   Breast cancer of upper-outer quadrant of right female breast (HCC)-status post right-sided mastectomy -Continue outpatient follow-up with oncology service -Continue  Arimidex.   Nonalcoholic steatohepatitis (NASH) -Stable LFTs -Continue outpatient follow-up with gastroenterology service.   Depression -No suicidal ideation or hallucinations -Continue home antihypertensive agents.   Hypothyroidism -Continue Synthroid. Abnormal Thyroid Function test Repeat outpatient   Essential hypertension, benign - holding HCTZ and losartan -Continue metoprolol -Avoid hypotension.     DVT prophylaxis: heparin Code Status: full Family Communication: daughter Disposition:   Status is: Inpatient Remains inpatient appropriate because: need for continued inpatient care   Consultants:  IR ID  Procedures:  LP IMPRESSION: 1. Fluoroscopically-guided L4-L5 lumbar puncture. 2. Opening pressure: 12 cm water. 3. Despite applying significant table tilt, only 1 mL of CSF could be collected for laboratory studies. This could potentially be due to patient dehydration. 4. No immediate post-procedure complication.  Antimicrobials:  Anti-infectives (From admission, onward)    Start     Dose/Rate Route Frequency Ordered Stop   03/24/23 1700  acyclovir (ZOVIRAX) 635 mg in dextrose 5 % 100 mL IVPB  Status:  Discontinued        10 mg/kg  63.7 kg (Adjusted) 112.7 mL/hr over 60 Minutes Intravenous Every 8 hours 03/24/23 1540 03/25/23 1755   03/22/23 1500  ciprofloxacin (CIPRO) IVPB 400 mg  Status:  Discontinued        400 mg 200 mL/hr over 60 Minutes Intravenous Every 12 hours 03/22/23 1400 03/24/23 1015   03/22/23 0915  cefTRIAXone (ROCEPHIN) 2 g in sodium chloride 0.9 % 100 mL IVPB  Status:  Discontinued        2 g 200 mL/hr over 30 Minutes Intravenous Every 24  hours 03/22/23 0823 03/22/23 1646       Subjective: Doesn't want to go to SNF  Objective: Vitals:   03/27/23 0634 03/27/23 0752 03/27/23 0803 03/27/23 1549  BP: 134/74  (!) 146/59 (!) 154/68  Pulse: 62 60 (!) 59 65  Resp: 18 18  18   Temp: (!) 97.5 F (36.4 C) (!) 97.4 F (36.3 C)  97.7 F (36.5 C)  TempSrc: Oral   Oral  SpO2: 100% 100%  100%  Weight:      Height:        Intake/Output Summary (Last 24 hours) at 03/27/2023 1634 Last data filed at 03/26/2023 2130 Gross per 24 hour  Intake 120 ml  Output --  Net 120 ml   Filed Weights   03/19/23 1609 03/25/23 0337 03/26/23 0338  Weight: 80.7 kg 84.5 kg 86.7 kg    Examination:  General: NAD Cardiovascular: RRR Lungs: unlabored Neurological: Kimberly [name]&Ox3, moving all extremities, more alert Extremities: no LEE    Data Reviewed: I have personally reviewed following labs and imaging studies  CBC: Recent Labs  Lab 03/23/23 2208 03/24/23 0501 03/24/23 0854 03/25/23 1052 03/26/23 0728 03/27/23 0049  WBC 3.4* 3.9* 3.0* 3.2* 4.3 5.1  NEUTROABS 2.0  --  2.3 1.0* 1.4* 1.3*  HGB 9.3* 9.6* 8.9* 8.6* 9.0* 9.2*  HCT 26.6* 28.5* 26.0* 25.9* 27.2* 26.9*  MCV 90.8 92.8 91.5 93.2 92.5 93.4  PLT 132* 134* 131* 157 218 243    Basic Metabolic Panel: Recent Labs  Lab 03/22/23 0839 03/23/23 0430 03/23/23 2208 03/24/23 0501 03/24/23 0854 03/25/23 1052 03/26/23 0981  03/27/23 0049  NA 126* 125*   < > 125* 126* 130* 133* 134*  K 3.5 3.7   < > 3.5 3.3* 4.5 3.9 3.8  CL 100 100   < > 99 101 104 107 105  CO2 19* 17*   < > 19* 19* 18* 18* 20*  GLUCOSE 158* 127*   < > 157* 140* 133* 112* 115*  BUN 26* 20   < > 16 15 11  7* 7*  CREATININE 1.14* 0.94   < > 0.89 0.90 0.91 0.86 0.82  CALCIUM 7.8* 7.0*   < > 7.3* 7.1* 7.5* 7.6* 7.8*  MG  --   --   --   --   --   --  1.3*  --   PHOS 1.7* 2.7  --   --   --   --  2.7  --    < > = values in this interval not displayed.    GFR: Estimated Creatinine Clearance: 65.7 mL/min (by C-G  formula based on SCr of 0.82 mg/dL).  Liver Function Tests: Recent Labs  Lab 03/22/23 0839 03/23/23 0430 03/24/23 0854 03/25/23 1052 03/26/23 0728  AST  --   --  40 66* 47*  ALT  --   --  29 46* 41  ALKPHOS  --   --  59 63 60  BILITOT  --   --  1.2 0.5 0.5  PROT  --   --  5.5* 5.9* 5.6*  ALBUMIN 2.8* 2.3* 2.3* 2.2* 2.3*    CBG: Recent Labs  Lab 03/26/23 2035 03/26/23 2342 03/27/23 0802 03/27/23 1222 03/27/23 1620  GLUCAP 127* 110* 124* 173* 171*     Recent Results (from the past 240 hour(s))  SARS Coronavirus 2 by RT PCR (hospital order, performed in [facility] hospital lab) *cepheid single result test* Anterior Nasal Swab     Status: None   Collection Time: 03/19/23 10:33 AM   Specimen: Anterior Nasal Swab  Result Value Ref Range Status   SARS Coronavirus 2 by RT PCR NEGATIVE NEGATIVE Final    Comment: (NOTE) SARS-CoV-2 target nucleic acids are NOT DETECTED.  The SARS-CoV-2 RNA is generally detectable in upper and lower respiratory specimens during the acute phase of infection. The lowest concentration of SARS-CoV-2 viral copies this assay can detect is 250 copies / mL. Kimberly [name] negative result does not preclude SARS-CoV-2 infection and should not be used as the sole basis for treatment or other patient management decisions.  Kimberly [name] negative result may occur with improper specimen collection / handling, submission of specimen other than nasopharyngeal swab, presence of viral mutation(s) within the areas targeted by this assay, and inadequate number of viral copies (<250 copies / mL). Kimberly [name] negative result must be combined with clinical observations, patient history, and epidemiological information.  Fact Sheet for Patients:   RoadLapTop.co.za  Fact Sheet for Healthcare Providers: http://kim-miller.com/  This test is not yet approved or  cleared by the [place] FDA and has been authorized for detection and/or diagnosis of  SARS-CoV-2 by FDA under an Emergency Use Authorization (EUA).  This EUA will remain in effect (meaning this test can be used) for the duration of the COVID-19 declaration under Section 564(b)(1) of the Act, 21 U.S.C. section 360bbb-3(b)(1), unless the authorization is terminated or revoked sooner.  Performed at [facility], [address] [place], [place] [address]   Blood Culture (routine x 2)     Status: None   Collection Time: 03/19/23 10:36 AM  Specimen: BLOOD  Result Value Ref Range Status   Specimen Description BLOOD BLOOD LEFT ARM  Final   Special Requests   Final    BOTTLES DRAWN AEROBIC AND ANAEROBIC Blood Culture adequate volume   Culture   Final    NO GROWTH 5 DAYS Performed at [facility], [address] [place], [place] [address]    Report Status 03/24/2023 FINAL  Final  Blood Culture (routine x 2)     Status: None   Collection Time: 03/19/23 10:36 AM   Specimen: BLOOD  Result Value Ref Range Status   Specimen Description BLOOD BLOOD LEFT ARM  Final   Special Requests   Final    BOTTLES DRAWN AEROBIC AND ANAEROBIC Blood Culture adequate volume   Culture   Final    NO GROWTH 5 DAYS Performed at [facility], [address] [place], [place] [address]    Report Status 03/24/2023 FINAL  Final  Urine Culture (for pregnant, neutropenic or urologic patients or patients with an indwelling urinary catheter)     Status: Abnormal   Collection Time: 03/20/23  4:50 PM   Specimen: Urine, Clean Catch  Result Value Ref Range Status   Specimen Description   Final    URINE, CLEAN CATCH Performed at [facility], [address], [place], [place] [address]    Special Requests   Final    NONE Performed at [facility], [address], [place], [place] [address]    Culture 20,000 COLONIES/mL STAPHYLOCOCCUS EPIDERMIDIS (Kimberly [name])  Final   Report Status 03/22/2023 FINAL  Final   Organism ID, Bacteria STAPHYLOCOCCUS EPIDERMIDIS (Kimberly [name])  Final      Susceptibility   Staphylococcus  epidermidis - MIC*    CIPROFLOXACIN <=0.5 SENSITIVE Sensitive     GENTAMICIN <=0.5 SENSITIVE Sensitive     NITROFURANTOIN <=16 SENSITIVE Sensitive     OXACILLIN >=4 RESISTANT Resistant     TETRACYCLINE 2 SENSITIVE Sensitive     VANCOMYCIN 2 SENSITIVE Sensitive     TRIMETH/SULFA 80 RESISTANT Resistant     CLINDAMYCIN <=0.25 SENSITIVE Sensitive     RIFAMPIN <=0.5 SENSITIVE Sensitive     Inducible Clindamycin NEGATIVE Sensitive     * 20,000 COLONIES/mL STAPHYLOCOCCUS EPIDERMIDIS  Culture, blood (Routine X 2) w Reflex to ID Panel     Status: None   Collection Time: 03/21/23  1:34 PM   Specimen: BLOOD  Result Value Ref Range Status   Specimen Description BLOOD BLOOD LEFT HAND  Final   Special Requests   Final    BOTTLES DRAWN AEROBIC AND ANAEROBIC Blood Culture adequate volume   Culture   Final    NO GROWTH 5 DAYS Performed at [facility], [address], [place], [place] [address]    Report Status 03/26/2023 FINAL  Final  SARS Coronavirus 2 by RT PCR (hospital order, performed in [facility] hospital lab) *cepheid single result test* Anterior Nasal Swab     Status: None   Collection Time: 03/21/23  1:35 PM   Specimen: Anterior Nasal Swab  Result Value Ref Range Status   SARS Coronavirus 2 by RT PCR NEGATIVE NEGATIVE Final    Comment: (NOTE) SARS-CoV-2 target nucleic acids are NOT DETECTED.  The SARS-CoV-2 RNA is generally detectable in upper and lower respiratory specimens during the acute phase of infection. The lowest concentration of SARS-CoV-2 viral copies this assay can detect is 250 copies / mL. Kimberly [name] negative result does not preclude SARS-CoV-2 infection and should not be used as the sole basis  for treatment or other patient management decisions.  Kimberly [name] negative result may occur with improper specimen collection / handling, submission of specimen other than nasopharyngeal swab, presence of viral mutation(s) within the areas targeted by this assay, and inadequate number of viral  copies (<250 copies / mL). Kimberly [name] negative result must be combined with clinical observations, patient history, and epidemiological information.  Fact Sheet for Patients:   RoadLapTop.co.za  Fact Sheet for Healthcare Providers: http://kim-miller.com/  This test is not yet approved or  cleared by the [place] FDA and has been authorized for detection and/or diagnosis of SARS-CoV-2 by FDA under an Emergency Use Authorization (EUA).  This EUA will remain in effect (meaning this test can be used) for the duration of the COVID-19 declaration under Section 564(b)(1) of the Act, 21 U.S.C. section 360bbb-3(b)(1), unless the authorization is terminated or revoked sooner.  Performed at [facility], [address] [place], [place] [address]   Culture, blood (Routine X 2) w Reflex to ID Panel     Status: None   Collection Time: 03/21/23  1:38 PM   Specimen: BLOOD  Result Value Ref Range Status   Specimen Description BLOOD BLOOD LEFT ARM  Final   Special Requests   Final    BOTTLES DRAWN AEROBIC AND ANAEROBIC Blood Culture adequate volume   Culture   Final    NO GROWTH 5 DAYS Performed at [facility], [address], [place], [place] [address]    Report Status 03/26/2023 FINAL  Final  Culture, blood (Routine X 2) w Reflex to ID Panel     Status: None (Preliminary result)   Collection Time: 03/23/23 10:08 PM   Specimen: Left Antecubital; Blood  Result Value Ref Range Status   Specimen Description LEFT ANTECUBITAL  Final   Special Requests   Final    BOTTLES DRAWN AEROBIC AND ANAEROBIC Blood Culture results may not be optimal due to an excessive volume of blood received in culture bottles   Culture   Final    NO GROWTH 4 DAYS Performed at [facility], [address] [place], [place] [address]    Report Status PENDING  Incomplete  Culture, blood (Routine X 2) w Reflex to ID Panel     Status: None (Preliminary result)   Collection Time:  03/23/23 10:25 PM   Specimen: BLOOD LEFT HAND  Result Value Ref Range Status   Specimen Description BLOOD LEFT HAND  Final   Special Requests   Final    BOTTLES DRAWN AEROBIC ONLY Blood Culture adequate volume   Culture   Final    NO GROWTH 4 DAYS Performed at [facility], [address], [place], [place] [address]    Report Status PENDING  Incomplete  Culture, blood (Routine X 2)     Status: None (Preliminary result)   Collection Time: 03/24/23  8:54 AM   Specimen: BLOOD LEFT HAND  Result Value Ref Range Status   Specimen Description   Final    BLOOD LEFT HAND BOTTLES DRAWN AEROBIC AND ANAEROBIC   Special Requests Blood Culture adequate volume  Final   Culture   Final    NO GROWTH 3 DAYS Performed at [facility], [address], [place], [place] [address]    Report Status PENDING  Incomplete  Culture, blood (Routine X 2)     Status: None (Preliminary result)   Collection Time: 03/24/23  8:54 AM   Specimen: BLOOD LEFT FOREARM  Result Value Ref Range Status   Specimen Description  Final    BLOOD LEFT FOREARM BOTTLES DRAWN AEROBIC AND ANAEROBIC   Special Requests Blood Culture adequate volume  Final   Culture   Final    NO GROWTH 3 DAYS Performed at [facility], [address] [place], [place] [address]    Report Status PENDING  Incomplete  Respiratory (~20 pathogens) panel by PCR     Status: None   Collection Time: 03/24/23 10:28 AM   Specimen: Nasopharyngeal Swab; Respiratory  Result Value Ref Range Status   Adenovirus NOT DETECTED NOT DETECTED Final   Coronavirus 229E NOT DETECTED NOT DETECTED Final    Comment: (NOTE) The Coronavirus on the Respiratory Panel, DOES NOT test for the novel  Coronavirus (2019 nCoV)    Coronavirus HKU1 NOT DETECTED NOT DETECTED Final   Coronavirus NL63 NOT DETECTED NOT DETECTED Final   Coronavirus OC43 NOT DETECTED NOT DETECTED Final   Metapneumovirus NOT DETECTED NOT DETECTED Final   Rhinovirus / Enterovirus NOT DETECTED NOT DETECTED  Final   Influenza Kimberly [name] NOT DETECTED NOT DETECTED Final   Influenza B NOT DETECTED NOT DETECTED Final   Parainfluenza Virus 1 NOT DETECTED NOT DETECTED Final   Parainfluenza Virus 2 NOT DETECTED NOT DETECTED Final   Parainfluenza Virus 3 NOT DETECTED NOT DETECTED Final   Parainfluenza Virus 4 NOT DETECTED NOT DETECTED Final   Respiratory Syncytial Virus NOT DETECTED NOT DETECTED Final   Bordetella pertussis NOT DETECTED NOT DETECTED Final   Bordetella Parapertussis NOT DETECTED NOT DETECTED Final   Chlamydophila pneumoniae NOT DETECTED NOT DETECTED Final   Mycoplasma pneumoniae NOT DETECTED NOT DETECTED Final    Comment: Performed at [facility], [address], [place], [place] [address]  Culture, blood (Routine X 2)     Status: None (Preliminary result)   Collection Time: 03/25/23  9:49 AM   Specimen: BLOOD  Result Value Ref Range Status   Specimen Description BLOOD SITE NOT SPECIFIED  Final   Special Requests   Final    BOTTLES DRAWN AEROBIC AND ANAEROBIC Blood Culture adequate volume   Culture   Final    NO GROWTH 2 DAYS Performed at [facility], [address], [place], [place] [address]    Report Status PENDING  Incomplete  Culture, blood (Routine X 2)     Status: None (Preliminary result)   Collection Time: 03/25/23 10:42 AM   Specimen: BLOOD LEFT HAND  Result Value Ref Range Status   Specimen Description BLOOD LEFT HAND  Final   Special Requests   Final    BOTTLES DRAWN AEROBIC ONLY Blood Culture results may not be optimal due to an inadequate volume of blood received in culture bottles   Culture   Final    NO GROWTH 2 DAYS Performed at [facility], [address], [place], [place] [address]    Report Status PENDING  Incomplete         Radiology Studies: MR BRAIN W WO CONTRAST  Result Date: 03/25/2023 CLINICAL DATA:  Mental status change. Unknown cause. Febrile syndrome. Acute metabolic encephalopathy. EXAM: MRI HEAD WITHOUT AND WITH CONTRAST  TECHNIQUE: Multiplanar, multiecho pulse sequences of the brain and surrounding structures were obtained without and with intravenous contrast. CONTRAST:  9mL GADAVIST GADOBUTROL 1 MMOL/ML IV SOLN COMPARISON:  CT head without contrast 03/24/2023 FINDINGS: Brain: No acute infarct, hemorrhage, or mass lesion is present. Periventricular and scattered subcortical T2 hyperintensities are mildly advanced for age. Deep brain nuclei are within normal limits. The ventricles  are of normal size. No significant extraaxial fluid collection is present. The brainstem and cerebellum are within normal limits. The internal auditory canals are within normal limits. Midline structures are within normal limits. Postcontrast images demonstrate no pathologic enhancement. Vascular: Flow is present in the major intracranial arteries. Skull and upper cervical spine: The craniocervical junction is normal. Upper cervical spine is within normal limits. Marrow signal is unremarkable. Sinuses/Orbits: Mild mucosal thickening is present in the maxillary sinuses and ethmoid air cells bilaterally. Small mastoid effusions are present. No obstructing nasopharyngeal lesion is present. The globes and orbits are within normal limits. IMPRESSION: 1. No acute intracranial abnormality. 2. Periventricular and scattered subcortical T2 hyperintensities bilaterally are mildly advanced for age. The finding is nonspecific but can be seen in the setting of chronic microvascular ischemia, Kimberly [name] demyelinating process such as multiple sclerosis, vasculitis, complicated migraine headaches, or as the sequelae of Kimberly [name] prior infectious or inflammatory process. Electronically Signed   By: Marin Roberts M.D.   On: 03/25/2023 19:57        Scheduled Meds:  anastrozole  1 mg Oral Daily   famotidine  20 mg Oral QHS   feeding supplement (GLUCERNA SHAKE)  237 mL Oral TID BM   gabapentin  100 mg Oral TID   heparin  5,000 Units Subcutaneous Q8H   insulin aspart  0-5  Units Subcutaneous QHS   insulin aspart  0-9 Units Subcutaneous TID WC   insulin glargine-yfgn  5 Units Subcutaneous QHS   levothyroxine  125 mcg Oral QAC breakfast   metoprolol succinate  100 mg Oral Daily   mirabegron ER  25 mg Oral QPM   multivitamin with minerals  1 tablet Oral Daily   pantoprazole  40 mg Oral Daily   venlafaxine XR  75 mg Oral QHS   Continuous Infusions:     LOS: 7 days    Time spent: over 30 min    Lacretia Nicks, MD [facility]   To contact the attending provider between 7A-7P or the covering provider during after hours 7P-7A, please log into the web site [email or web address] and access using universal  password for that web site. If you do not have the password, please call the hospital operator.  03/27/2023, 4:34 PM

## 2023-03-28 ENCOUNTER — Ambulatory Visit: Payer: Medicare Other | Admitting: Urology

## 2023-03-28 DIAGNOSIS — G9341 Metabolic encephalopathy: Secondary | ICD-10-CM | POA: Diagnosis not present

## 2023-03-28 LAB — GLUCOSE, CAPILLARY: Glucose-Capillary: 196 mg/dL — ABNORMAL HIGH (ref 70–99)

## 2023-03-28 NOTE — Plan of Care (Signed)

## 2023-03-28 NOTE — Progress Notes (Signed)
Physical Therapy Treatment Patient Details Name: Kimberly [name] MRN: [id number] DOB: Apr 10, 1951 Today's Date: 03/28/2023   History of Present Illness Kimberly [name] is a 72 y.o. female with medical history significant of type 2 diabetes with nephropathy, depression, history of breast cancer (status post bilateral mastectomy), gastroesophageal flux disease, class I obesity, and hypothyroidism; who presented to the hospital secondary to temperature and altered mental status.  Symptoms have been present approximately since Friday (03/16/23) and worsening. Patient with recent UTI treated with Bactrim as an outpatient; however after finishing antibiotic has developed nausea vomiting and decreased oral intake.  Patient's daughter noticed some associated weight loss and signs of dehydration.  Patient expressed continue taking her meds despite no eating and drinking well.  Has not been any abdominal pain, hematuria, melena, hematochezia, focal weaknesses, chest pain, shortness of breath, coughing spells or sick contacts.  Daughter reported son fluctuating low-grade temperature for what the mom has been using Tylenol at home.    PT Comments  Pt greeted up in chair on arrival and agreeable to session with continued progress towards acute goals. Pt able to perform transfers and gait with min guard for safety and RW for support. PT contineus to require light cues for RW proximity and posture. Pt able to perform short gait trial without UE support, however pt reaching for environmental support. Pt was educated on continued walker use to maximize functional independence, safety, and decrease risk for falls. Pt A&Ox4 during session with daughter arriving at end of session and supportive. Will continue to follow acutely.     If plan is discharge home, recommend the following: A little help with walking and/or transfers;A little help with bathing/dressing/bathroom;Help with stairs or ramp for entrance;Assist for  transportation;Assistance with cooking/housework;Direct supervision/assist for medications management   Can travel by private vehicle     No  Equipment Recommendations  None recommended by PT    Recommendations for Other Services       Precautions / Restrictions Precautions Precautions: Fall Precaution Comments: patient reports a fall May 2024 associated with infection and delirium Restrictions Weight Bearing Restrictions: No     Mobility  Bed Mobility Overal bed mobility: Needs Assistance             General bed mobility comments: pt up in chair on arrival    Transfers Overall transfer level: Needs assistance Equipment used: Rolling walker (2 wheels) Transfers: Sit to/from Stand Sit to Stand: Min guard           General transfer comment: good hand placement and steady rise    Ambulation/Gait Ambulation/Gait assistance: Min guard Gait Distance (Feet): 255 Feet Assistive device: Rolling walker (2 wheels), None Gait Pattern/deviations: Step-through pattern, Decreased step length - right, Decreased step length - left, Decreased stride length, Wide base of support, Trunk flexed Gait velocity: decr     General Gait Details: overall slow but steady pace with RW and min guard for safety.pt with short trial without RW with pt reaching for UE support, pt enodrsing prefernce for RW support   Stairs             Wheelchair Mobility     Tilt Bed    Modified Rankin (Stroke Patients Only)       Balance Overall balance assessment: Needs assistance Sitting-balance support: Feet supported, Bilateral upper extremity supported Sitting balance-Leahy Scale: Good     Standing balance support: No upper extremity supported, During functional activity Standing balance-Leahy Scale: Fair Standing balance comment: fair/good with  RW                            Cognition Arousal/Alertness: Awake/alert Behavior During Therapy: WFL for tasks  assessed/performed Overall Cognitive Status: Impaired/Different from baseline Area of Impairment: Attention, Memory, Safety/judgement, Awareness, Problem solving                   Current Attention Level: Sustained Memory: Decreased recall of precautions, Decreased short-term memory Following Commands: Follows one step commands consistently, Follows multi-step commands with increased time     Problem Solving: Slow processing, Requires verbal cues General Comments: A&Ox4 some difficulty and delay with word finding        Exercises      General Comments        Pertinent Vitals/Pain Pain Assessment Pain Assessment: No/denies pain    Home Living                          Prior Function            PT Goals (current goals can now be found in the care plan section) Acute Rehab PT Goals Patient Stated Goal: Go home with family to help PT Goal Formulation: With patient/family Time For Goal Achievement: 04/06/23 Progress towards PT goals: Progressing toward goals    Frequency    Min 3X/week      PT Plan      Co-evaluation              AM-PAC PT "6 Clicks" Mobility   Outcome Measure  Help needed turning from your back to your side while in a flat bed without using bedrails?: A Little Help needed moving from lying on your back to sitting on the side of a flat bed without using bedrails?: A Little Help needed moving to and from a bed to a chair (including a wheelchair)?: A Little Help needed standing up from a chair using your arms (e.g., wheelchair or bedside chair)?: A Little Help needed to walk in hospital room?: A Little Help needed climbing 3-5 steps with a railing? : A Lot 6 Click Score: 17    End of Session Equipment Utilized During Treatment: Gait belt Activity Tolerance: Patient tolerated treatment well Patient left: in chair;with call bell/phone within reach;with chair alarm set;with family/visitor present Nurse Communication:  Mobility status PT Visit Diagnosis: Unsteadiness on feet (R26.81);History of falling (Z91.81);Other abnormalities of gait and mobility (R26.89)     Time: 8119-1478 PT Time Calculation (min) (ACUTE ONLY): 15 min  Charges:    $Gait Training: 8-22 mins PT General Charges $$ ACUTE PT VISIT: 1 Visit                     Amiere Cawley R. PTA Acute Rehabilitation Services Office: [phone]   Catalina Antigua 03/28/2023, 10:58 AM

## 2023-03-28 NOTE — Discharge Summary (Signed)
Physician Discharge Summary  Kimberly [name]:[id number] DOB: 08/26/1951 DOA: 03/19/2023  PCP: Tommie Sams, DO  Admit date: 03/19/2023 Discharge date: 03/28/2023  Admitted From: Home Disposition:  Home  Recommendations for Outpatient Follow-up:  Follow up with PCP in 1-2 weeks Please obtain BMP/CBC in one week your next doctors visit.  Follow-up outpatient infectious disease   Discharge Condition: Stable CODE STATUS: Full code code Diet recommendation: Diabetic  Brief/Interim Summary:  72 y.o. female with medical history significant of type 2 diabetes with nephropathy, depression, history of breast cancer (status post bilateral mastectomy), gastroesophageal flux disease, class I obesity, and hypothyroidism; who presented to the hospital secondary to temperature and altered mental status.  Symptoms have been present approximately since Friday (03/16/23) and worsening. Patient with recent UTI treated with Bactrim as an outpatient; however after finishing antibiotic has developed nausea vomiting and decreased oral intake.  Initially admitted on 7/22 for hyponatremia/dehydration, AKI and altered mental status.  Eventually transferred to [facility] for neurology, ID evaluation and lumbar puncture.  Workup thus far from ID standpoint has been unrevealing.  Also completed 1 day of Rocephin followed by 3 days of Cipro in the hospital.  Chest x-ray, CT abdomen pelvis negative.  Seen by infectious disease and neurology.  Both services recommending outpatient follow-up.   PT/OT recommended SNF but patient is not really interested at this time but gradually improving.  Patient doing much better today, daughter at bedside as well.  Patient would like to go home.  Medically stable at this time.  Assessment and Plan:   Acute metabolic encephalopathy--multifactorial, resolved Overall unclear etiology.  CT head and MRI brain overall negative.  Seen by neurology, suspicion for possible  delirium therefore recommending outpatient follow-up for evaluation of memory issues.     Persistent fever, subsided -Currently appears to have subsided.  Seen by infectious disease.  Prior to hospitalization was treated with 1 week of Bactrim.  During this hospitalization received 1 day of Rocephin followed by 3 doses of Cipro from 7/25 - 7/26.  Chest x-ray and CT abdomen pelvis has been negative.  Most of the serology has been negative, Q fever and arbovirus panel are pending. - ID has arranged for outpatient follow-up on 9/5 at 2:45 PM   Hypomagnesemia/hypophosphatemia/hyponatremia and hypokalemia - Resume home regimen   Class 1 obesity -Body mass index is 31.51 kg/m.   Mixed hyperlipidemia -continue to follow lipid panel as an outpatient -continue low fat/heart healthy diet   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) -A1c 6.4 Resume home regimen   Breast cancer of upper-outer quadrant of right female breast (HCC)-status post right-sided mastectomy Continue Arimidex, follow-up outpatient oncology   Nonalcoholic steatohepatitis (NASH) Stable follow-up outpatient GI   Depression -No suicidal ideation or hallucinations -Continue home antihypertensive agents.   Hypothyroidism -Continue Synthroid.   Abnormal Thyroid Function test Repeat outpatient   Essential hypertension, benign Continue home meds       Body mass index is 33.86 kg/m.       Discharge Diagnoses:  Principal Problem:   Acute metabolic encephalopathy Active Problems:   Essential hypertension, benign   Hypothyroidism   Depression   Nonalcoholic steatohepatitis (NASH)   Breast cancer of upper-outer quadrant of right female breast (HCC)   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)   Mixed hyperlipidemia   Encephalitis   Hyponatremia   Class 1 obesity   Sepsis (HCC)      Consultations: Infectious disease Neurology  Subjective: Feels okay  no complaints.  Discharge  Exam: Vitals:   03/28/23 0552 03/28/23 0746  BP: (!) 149/69 (!) 140/52  Pulse: (!) 52 66  Resp: 18 18  Temp: 98.1 F (36.7 C) 98 F (36.7 C)  SpO2: 99% 100%   Vitals:   03/27/23 1549 03/27/23 2051 03/28/23 0552 03/28/23 0746  BP: (!) 154/68 (!) 152/62 (!) 149/69 (!) 140/52  Pulse: 65 66 (!) 52 66  Resp: 18 18 18 18   Temp: 97.7 F (36.5 C) 98.5 F (36.9 C) 98.1 F (36.7 C) 98 F (36.7 C)  TempSrc: Oral  Oral   SpO2: 100% 98% 99% 100%  Weight:      Height:        General: Pt is alert, awake, not in acute distress Cardiovascular: RRR, S1/S2 +, no rubs, no gallops Respiratory: CTA bilaterally, no wheezing, no rhonchi Abdominal: Soft, NT, ND, bowel sounds + Extremities: no edema, no cyanosis  Discharge Instructions   Allergies as of 03/28/2023       Reactions   Ace Inhibitors Cough        Medication List     STOP taking these medications    nitrofurantoin (macrocrystal-monohydrate) 100 MG capsule Commonly known as: MACROBID       TAKE these medications    anastrozole 1 MG tablet Commonly known as: ARIMIDEX TAKE 1 TABLET DAILY   gabapentin 100 MG capsule Commonly known as: NEURONTIN Take 1 capsule (100 mg total) by mouth 3 (three) times daily.   glipiZIDE 5 MG 24 hr tablet Commonly known as: GLUCOTROL XL Take 1 tablet (5 mg total) by mouth daily with breakfast.   hydrochlorothiazide 25 MG tablet Commonly known as: HYDRODIURIL TAKE 1 TABLET DAILY   levothyroxine 125 MCG tablet Commonly known as: SYNTHROID Take 1 tablet (125 mcg total) by mouth daily before breakfast.   losartan 100 MG tablet Commonly known as: COZAAR TAKE 1 TABLET EVERY MORNING   Magnesium 250 MG Tabs Take 250 mg by mouth daily.   metFORMIN 500 MG 24 hr tablet Commonly known as: GLUCOPHAGE-XR Take 1 tablet (500 mg total) by mouth 2 (two) times daily with a meal.   metoprolol succinate 100 MG 24 hr tablet Commonly known as: TOPROL-XL TAKE 1 TABLET AT BEDTIME   WITH OR  IMMEDIATELY        FOLLOWING A MEAL   miconazole 2 % powder Commonly known as: MICOTIN Apply topically as needed for itching.   mirabegron ER 25 MG Tb24 tablet Commonly known as: MYRBETRIQ Take 1 tablet (25 mg total) by mouth daily.   omeprazole 20 MG capsule Commonly known as: PRILOSEC Take 1 capsule (20 mg total) by mouth at bedtime.   tolterodine 4 MG 24 hr capsule Commonly known as: DETROL LA Take 1 capsule (4 mg total) by mouth daily.   Evaristo Bury FlexTouch 100 UNIT/ML FlexTouch Pen Generic drug: insulin degludec Inject 28 Units into the skin at bedtime.   venlafaxine XR 75 MG 24 hr capsule Commonly known as: EFFEXOR-XR TAKE 1 CAPSULE BY MOUTH ONCE DAILY WITH BREAKFAST               Durable Medical Equipment  (From admission, onward)           Start     Ordered   03/23/23 1353  For home use only DME Bedside commode  Once       Question:  Patient needs a bedside commode to treat with the following condition  Answer:  Unsteady gait when walking  03/23/23 1352            Follow-up Information     [facility] Follow up.   Why: Will contact you to schedule home health visits.               Allergies  Allergen Reactions   Ace Inhibitors Cough    You were cared for by a hospitalist during your hospital stay. If you have any questions about your discharge medications or the care you received while you were in the hospital after you are discharged, you can call the unit and asked to speak with the hospitalist on call if the hospitalist that took care of you is not available. Once you are discharged, your primary care physician will handle any further medical issues. Please note that no refills for any discharge medications will be authorized once you are discharged, as it is imperative that you return to your primary care physician (or establish a relationship with a primary care physician if you do not have one) for your aftercare needs so that  they can reassess your need for medications and monitor your lab values.  You were cared for by a hospitalist during your hospital stay. If you have any questions about your discharge medications or the care you received while you were in the hospital after you are discharged, you can call the unit and asked to speak with the hospitalist on call if the hospitalist that took care of you is not available. Once you are discharged, your primary care physician will handle any further medical issues. Please note that NO REFILLS for any discharge medications will be authorized once you are discharged, as it is imperative that you return to your primary care physician (or establish a relationship with a primary care physician if you do not have one) for your aftercare needs so that they can reassess your need for medications and monitor your lab values.  Please request your Prim.MD to go over all Hospital Tests and Procedure/Radiological results at the follow up, please get all Hospital records sent to your Prim MD by signing hospital release before you go home.  Get CBC, CMP, 2 view Chest X ray checked  by Primary MD during your next visit or SNF MD in 5-7 days ( we routinely change or add medications that can affect your baseline labs and fluid status, therefore we recommend that you get the mentioned basic workup next visit with your PCP, your PCP may decide not to get them or add new tests based on their clinical decision)  On your next visit with your primary care physician please Get Medicines reviewed and adjusted.  If you experience worsening of your admission symptoms, develop shortness of breath, life threatening emergency, suicidal or homicidal thoughts you must seek medical attention immediately by calling 911 or calling your MD immediately  if symptoms less severe.  You Must read complete instructions/literature along with all the possible adverse reactions/side effects for all the Medicines you  take and that have been prescribed to you. Take any new Medicines after you have completely understood and accpet all the possible adverse reactions/side effects.   Do not drive, operate heavy machinery, perform activities at heights, swimming or participation in water activities or provide baby sitting services if your were admitted for syncope or siezures until you have seen by Primary MD or a Neurologist and advised to do so again.  Do not drive when taking Pain medications.   Procedures/Studies: MR  BRAIN W WO CONTRAST  Result Date: 03/25/2023 CLINICAL DATA:  Mental status change. Unknown cause. Febrile syndrome. Acute metabolic encephalopathy. EXAM: MRI HEAD WITHOUT AND WITH CONTRAST TECHNIQUE: Multiplanar, multiecho pulse sequences of the brain and surrounding structures were obtained without and with intravenous contrast. CONTRAST:  9mL GADAVIST GADOBUTROL 1 MMOL/ML IV SOLN COMPARISON:  CT head without contrast 03/24/2023 FINDINGS: Brain: No acute infarct, hemorrhage, or mass lesion is present. Periventricular and scattered subcortical T2 hyperintensities are mildly advanced for age. Deep brain nuclei are within normal limits. The ventricles are of normal size. No significant extraaxial fluid collection is present. The brainstem and cerebellum are within normal limits. The internal auditory canals are within normal limits. Midline structures are within normal limits. Postcontrast images demonstrate no pathologic enhancement. Vascular: Flow is present in the major intracranial arteries. Skull and upper cervical spine: The craniocervical junction is normal. Upper cervical spine is within normal limits. Marrow signal is unremarkable. Sinuses/Orbits: Mild mucosal thickening is present in the maxillary sinuses and ethmoid air cells bilaterally. Small mastoid effusions are present. No obstructing nasopharyngeal lesion is present. The globes and orbits are within normal limits. IMPRESSION: 1. No acute  intracranial abnormality. 2. Periventricular and scattered subcortical T2 hyperintensities bilaterally are mildly advanced for age. The finding is nonspecific but can be seen in the setting of chronic microvascular ischemia, a demyelinating process such as multiple sclerosis, vasculitis, complicated migraine headaches, or as the sequelae of a prior infectious or inflammatory process. Electronically Signed   By: Marin Roberts M.D.   On: 03/25/2023 19:57   DG FL GUIDED LUMBAR PUNCTURE  Result Date: 03/25/2023 CLINICAL DATA:  Confusion. Altered mental status. Fever. Request received for fluoroscopically-guided diagnostic lumbar puncture. EXAM: DIAGNOSTIC LUMBAR PUNCTURE UNDER FLUOROSCOPIC GUIDANCE COMPARISON:  Head CT from 03/24/2023 reviewed prior to the procedure. FLUOROSCOPY: Radiation Exposure Index (as provided by the fluoroscopic device): 27.50 mGy Kerma PROCEDURE: The patient was positioned prone on the fluoroscopy table. An appropriate skin entry site was determined under fluoroscopy and marked. The operated donned sterile gloves and a mask. The lower back was prepped and draped in the usual sterile fashion. Local anesthesia was provided with 1% lidocaine. Under intermittent fluoroscopy, lumbar puncture was performed at the L4-L5 level using a 20 gauge spinal needle. Initially, there was return of faintly blood-tinged CSF. There was an opening pressure of 12 cm water. The discoloration of the CSF quickly cleared as CSF exited the spinal needle and entered the collection tubing. Despite applying significant table tilt, only 1 mL of CSF could be collected for laboratory studies. The inner stylet was replaced within the needle and the needle was removed in its entirety. The patient tolerated the procedure well and no immediate post-procedure complication was apparent. The procedure was performed by Alwyn Ren, NP, supervised and briefly assisted by Dr. Jackey Loge. IMPRESSION: 1.  Fluoroscopically-guided L4-L5 lumbar puncture. 2. Opening pressure: 12 cm water. 3. Despite applying significant table tilt, only 1 mL of CSF could be collected for laboratory studies. This could potentially be due to patient dehydration. 4. No immediate post-procedure complication. Electronically Signed   By: Jackey Loge D.O.   On: 03/25/2023 14:10   CT HEAD WO CONTRAST ( )  Result Date: 03/24/2023 CLINICAL DATA:  Mental status change, unknown cause EXAM: CT HEAD WITHOUT CONTRAST TECHNIQUE: Contiguous axial images were obtained from the base of the skull through the vertex without intravenous contrast. RADIATION DOSE REDUCTION: This exam was performed according to the departmental dose-optimization program which includes automated  exposure control, adjustment of the mA and/or kV according to patient size and/or use of iterative reconstruction technique. COMPARISON:  03/19/2023 FINDINGS: Brain: No evidence of acute infarction, hemorrhage, hydrocephalus, extra-axial collection or mass lesion/mass effect. Scattered low-density changes within the periventricular and subcortical white matter most compatible with chronic microvascular ischemic change. Vascular: No hyperdense vessel or unexpected calcification. Skull: Normal. Negative for fracture or focal lesion. Sinuses/Orbits: Mild ethmoid sinus mucosal thickening. Other: None. IMPRESSION: 1. No acute intracranial findings. Consider follow-up with MRI, as clinically indicated. 2. Chronic microvascular ischemic change. Electronically Signed   By: Duanne Guess D.O.   On: 03/24/2023 14:21   DG CHEST PORT 1 VIEW  Result Date: 03/22/2023 CLINICAL DATA:  Fever. EXAM: PORTABLE CHEST 1 VIEW COMPARISON:  March 19, 2023. FINDINGS: The heart size and mediastinal contours are within normal limits. Both lungs are clear. The visualized skeletal structures are unremarkable. IMPRESSION: No active disease. Electronically Signed   By: Lupita Raider M.D.   On: 03/22/2023  13:14   CT ABDOMEN PELVIS W CONTRAST  Result Date: 03/19/2023 CLINICAL DATA:  Abdominal pain, acute, nonlocalized. EXAM: CT ABDOMEN AND PELVIS WITH CONTRAST TECHNIQUE: Multidetector CT imaging of the abdomen and pelvis was performed using the standard protocol following bolus administration of intravenous contrast. RADIATION DOSE REDUCTION: This exam was performed according to the departmental dose-optimization program which includes automated exposure control, adjustment of the mA and/or kV according to patient size and/or use of iterative reconstruction technique. CONTRAST:  80mL OMNIPAQUE IOHEXOL 300 MG/ML  SOLN COMPARISON:  01/15/2023 FINDINGS: Lower chest: Motion artifact at the lung bases without gross abnormality. Hepatobiliary: Significant motion artifact without gross abnormality to the liver or gallbladder. Question a small gallstone on the delayed set of images. Pancreas: Unremarkable. No pancreatic ductal dilatation or surrounding inflammatory changes. Spleen: Spleen has a heterogeneous appearance on the initial set of images but appears to be within normal limits on the delayed images. Spleen is slightly prominent for size and similar to the previous examination. Adrenals/Urinary Tract: Normal appearance of the adrenal glands. Normal appearance of the urinary bladder. Kidneys are best seen on the delayed images due to significant motion artifact on the portal venous phase. Again noted is a cyst in the left kidney lower pole. No hydronephrosis. Mild perinephric stranding is similar to the previous examination. Stomach/Bowel: Dilatation of the distal esophagus containing gas. No gross abnormality to the stomach. No evidence for bowel obstruction. Moderate amount of stool in the colon. Normal appendix. No evidence for acute bowel inflammation. Vascular/Lymphatic: Normal caliber of the abdominal aorta. Again noted is a calcified structure at the splenic hilum which is suggestive for a small aneurysm  measuring approximately 1.0 cm. Mildly prominent lymph nodes in the gastrohepatic ligament on image 21/1 measures 1.2 cm and similar to the comparison examination. Small lymph nodes in left iliac chain on image 51/1 are stable. Reproductive: Status post hysterectomy. No adnexal masses. Other: Negative for free fluid.  Negative for free air. Musculoskeletal: Again noted is multilevel degenerative disc and endplate disease in lumbar spine. No acute bone abnormality. IMPRESSION: 1. Study has limitations due to motion artifact. 2. No acute abnormality in the abdomen or pelvis. 3. Stable calcified splenic artery aneurysm measuring approximately 1.0 cm. 4. Question small gallstone. Electronically Signed   By: Richarda Overlie M.D.   On: 03/19/2023 14:12   CT Head Wo Contrast  Result Date: 03/19/2023 CLINICAL DATA:  Mental status change, unknown cause EXAM: CT HEAD WITHOUT CONTRAST TECHNIQUE: Contiguous axial  images were obtained from the base of the skull through the vertex without intravenous contrast. RADIATION DOSE REDUCTION: This exam was performed according to the departmental dose-optimization program which includes automated exposure control, adjustment of the mA and/or kV according to patient size and/or use of iterative reconstruction technique. COMPARISON:  CT 01/15/23 FINDINGS: Brain: No evidence of acute infarction, hemorrhage, hydrocephalus, extra-axial collection or mass lesion/mass effect. There are scattered predominantly subcortical hypodensities, favored to represent sequela of chronic microvascular ischemic change. Vascular: No hyperdense vessel or unexpected calcification. Skull: No fracture. Redemonstrated lucent lesion along the midline frontal calvarium. This most likely represents an osseous hemangioma. Sinuses/Orbits: No middle ear or mastoid effusion. Paranasal sinuses are clear. Orbits are unremarkable. Other: None. IMPRESSION: 1. No CT finding to explain altered mental status. 2. There are  scattered predominantly subcortical hypodensities, favored to represent sequela of chronic microvascular ischemic change. Electronically Signed   By: Lorenza Cambridge M.D.   On: 03/19/2023 13:58   DG Chest Port 1 View  Result Date: 03/19/2023 CLINICAL DATA:  Questionable sepsis EXAM: PORTABLE CHEST 1 VIEW COMPARISON:  X-ray 01/16/2023 FINDINGS: Underinflation. No consolidation, pneumothorax or effusion. No edema. Normal cardiopericardial silhouette. Film is slightly rotated. Overlapping cardiac leads. Curvature of the spine. Surgical clips in the right axillary region. IMPRESSION: Underinflation.  No acute cardiopulmonary disease. Electronically Signed   By: Karen Kays M.D.   On: 03/19/2023 10:59     The results of significant diagnostics from this hospitalization (including imaging, microbiology, ancillary and laboratory) are listed below for reference.     Microbiology: Recent Results (from the past 240 hour(s))  SARS Coronavirus 2 by RT PCR (hospital order, performed in [facility] hospital lab) *cepheid single result test* Anterior Nasal Swab     Status: None   Collection Time: 03/19/23 10:33 AM   Specimen: Anterior Nasal Swab  Result Value Ref Range Status   SARS Coronavirus 2 by RT PCR NEGATIVE NEGATIVE Final    Comment: (NOTE) SARS-CoV-2 target nucleic acids are NOT DETECTED.  The SARS-CoV-2 RNA is generally detectable in upper and lower respiratory specimens during the acute phase of infection. The lowest concentration of SARS-CoV-2 viral copies this assay can detect is 250 copies / mL. A negative result does not preclude SARS-CoV-2 infection and should not be used as the sole basis for treatment or other patient management decisions.  A negative result may occur with improper specimen collection / handling, submission of specimen other than nasopharyngeal swab, presence of viral mutation(s) within the areas targeted by this assay, and inadequate number of viral copies (<250  copies / mL). A negative result must be combined with clinical observations, patient history, and epidemiological information.  Fact Sheet for Patients:   RoadLapTop.co.za  Fact Sheet for Healthcare Providers: http://kim-miller.com/  This test is not yet approved or  cleared by the [place] FDA and has been authorized for detection and/or diagnosis of SARS-CoV-2 by FDA under an Emergency Use Authorization (EUA).  This EUA will remain in effect (meaning this test can be used) for the duration of the COVID-19 declaration under Section 564(b)(1) of the Act, 21 U.S.C. section 360bbb-3(b)(1), unless the authorization is terminated or revoked sooner.  Performed at [facility], [address] [place], [place] [address]   Blood Culture (routine x 2)     Status: None   Collection Time: 03/19/23 10:36 AM   Specimen: BLOOD  Result Value Ref Range Status   Specimen Description BLOOD BLOOD LEFT ARM  Final  Special Requests   Final    BOTTLES DRAWN AEROBIC AND ANAEROBIC Blood Culture adequate volume   Culture   Final    NO GROWTH 5 DAYS Performed at [facility], [address] [place], [place] [address]    Report Status 03/24/2023 FINAL  Final  Blood Culture (routine x 2)     Status: None   Collection Time: 03/19/23 10:36 AM   Specimen: BLOOD  Result Value Ref Range Status   Specimen Description BLOOD BLOOD LEFT ARM  Final   Special Requests   Final    BOTTLES DRAWN AEROBIC AND ANAEROBIC Blood Culture adequate volume   Culture   Final    NO GROWTH 5 DAYS Performed at [facility], [address] [place], [place] [address]    Report Status 03/24/2023 FINAL  Final  Urine Culture (for pregnant, neutropenic or urologic patients or patients with an indwelling urinary catheter)     Status: Abnormal   Collection Time: 03/20/23  4:50 PM   Specimen: Urine, Clean Catch  Result Value Ref Range Status   Specimen Description   Final    URINE,  CLEAN CATCH Performed at [facility], [address] [place], [place] [address]    Special Requests   Final    NONE Performed at [facility], [address], [place], [place] [address]    Culture 20,000 COLONIES/mL STAPHYLOCOCCUS EPIDERMIDIS (A)  Final   Report Status 03/22/2023 FINAL  Final   Organism ID, Bacteria STAPHYLOCOCCUS EPIDERMIDIS (A)  Final      Susceptibility   Staphylococcus epidermidis - MIC*    CIPROFLOXACIN <=0.5 SENSITIVE Sensitive     GENTAMICIN <=0.5 SENSITIVE Sensitive     NITROFURANTOIN <=16 SENSITIVE Sensitive     OXACILLIN >=4 RESISTANT Resistant     TETRACYCLINE 2 SENSITIVE Sensitive     VANCOMYCIN 2 SENSITIVE Sensitive     TRIMETH/SULFA 80 RESISTANT Resistant     CLINDAMYCIN <=0.25 SENSITIVE Sensitive     RIFAMPIN <=0.5 SENSITIVE Sensitive     Inducible Clindamycin NEGATIVE Sensitive     * 20,000 COLONIES/mL STAPHYLOCOCCUS EPIDERMIDIS  Culture, blood (Routine X 2) w Reflex to ID Panel     Status: None   Collection Time: 03/21/23  1:34 PM   Specimen: BLOOD  Result Value Ref Range Status   Specimen Description BLOOD BLOOD LEFT HAND  Final   Special Requests   Final    BOTTLES DRAWN AEROBIC AND ANAEROBIC Blood Culture adequate volume   Culture   Final    NO GROWTH 5 DAYS Performed at [facility], [address] [place], [place] [address]    Report Status 03/26/2023 FINAL  Final  SARS Coronavirus 2 by RT PCR (hospital order, performed in [facility] hospital lab) *cepheid single result test* Anterior Nasal Swab     Status: None   Collection Time: 03/21/23  1:35 PM   Specimen: Anterior Nasal Swab  Result Value Ref Range Status   SARS Coronavirus 2 by RT PCR NEGATIVE NEGATIVE Final    Comment: (NOTE) SARS-CoV-2 target nucleic acids are NOT DETECTED.  The SARS-CoV-2 RNA is generally detectable in upper and lower respiratory specimens during the acute phase of infection. The lowest concentration of SARS-CoV-2 viral copies this assay can detect is  250 copies / mL. A negative result does not preclude SARS-CoV-2 infection and should not be used as the sole basis for treatment or other patient management decisions.  A negative result may occur with improper specimen collection / handling,  submission of specimen other than nasopharyngeal swab, presence of viral mutation(s) within the areas targeted by this assay, and inadequate number of viral copies (<250 copies / mL). A negative result must be combined with clinical observations, patient history, and epidemiological information.  Fact Sheet for Patients:   RoadLapTop.co.za  Fact Sheet for Healthcare Providers: http://kim-miller.com/  This test is not yet approved or  cleared by the [place] FDA and has been authorized for detection and/or diagnosis of SARS-CoV-2 by FDA under an Emergency Use Authorization (EUA).  This EUA will remain in effect (meaning this test can be used) for the duration of the COVID-19 declaration under Section 564(b)(1) of the Act, 21 U.S.C. section 360bbb-3(b)(1), unless the authorization is terminated or revoked sooner.  Performed at [facility], [address], [place], [place] [address]   Culture, blood (Routine X 2) w Reflex to ID Panel     Status: None   Collection Time: 03/21/23  1:38 PM   Specimen: BLOOD  Result Value Ref Range Status   Specimen Description BLOOD BLOOD LEFT ARM  Final   Special Requests   Final    BOTTLES DRAWN AEROBIC AND ANAEROBIC Blood Culture adequate volume   Culture   Final    NO GROWTH 5 DAYS Performed at [facility], [address] [place], [place] [address]    Report Status 03/26/2023 FINAL  Final  Culture, blood (Routine X 2) w Reflex to ID Panel     Status: None   Collection Time: 03/23/23 10:08 PM   Specimen: Left Antecubital; Blood  Result Value Ref Range Status   Specimen Description LEFT ANTECUBITAL  Final   Special Requests   Final    BOTTLES DRAWN AEROBIC  AND ANAEROBIC Blood Culture results may not be optimal due to an excessive volume of blood received in culture bottles   Culture   Final    NO GROWTH 5 DAYS Performed at [facility], [address], [place], [place] [address]    Report Status 03/28/2023 FINAL  Final  Culture, blood (Routine X 2) w Reflex to ID Panel     Status: None   Collection Time: 03/23/23 10:25 PM   Specimen: BLOOD LEFT HAND  Result Value Ref Range Status   Specimen Description BLOOD LEFT HAND  Final   Special Requests   Final    BOTTLES DRAWN AEROBIC ONLY Blood Culture adequate volume   Culture   Final    NO GROWTH 5 DAYS Performed at [facility], [address] [place], [place] [address]    Report Status 03/28/2023 FINAL  Final  Culture, blood (Routine X 2)     Status: None (Preliminary result)   Collection Time: 03/24/23  8:54 AM   Specimen: BLOOD LEFT HAND  Result Value Ref Range Status   Specimen Description   Final    BLOOD LEFT HAND BOTTLES DRAWN AEROBIC AND ANAEROBIC   Special Requests Blood Culture adequate volume  Final   Culture   Final    NO GROWTH 4 DAYS Performed at [facility], [address], [place], [place] [address]    Report Status PENDING  Incomplete  Culture, blood (Routine X 2)     Status: None (Preliminary result)   Collection Time: 03/24/23  8:54 AM   Specimen: BLOOD LEFT FOREARM  Result Value Ref Range Status   Specimen Description   Final    BLOOD LEFT FOREARM BOTTLES DRAWN AEROBIC AND ANAEROBIC   Special Requests Blood Culture adequate volume  Final   Culture   Final    NO GROWTH 4 DAYS Performed at [facility], [address], [place], [place] [address]    Report Status PENDING  Incomplete  Respiratory (~20 pathogens) panel by PCR     Status: None   Collection Time: 03/24/23 10:28 AM   Specimen: Nasopharyngeal Swab; Respiratory  Result Value Ref Range Status   Adenovirus NOT DETECTED NOT DETECTED Final   Coronavirus 229E NOT DETECTED NOT DETECTED Final    Comment:  (NOTE) The Coronavirus on the Respiratory Panel, DOES NOT test for the novel  Coronavirus (2019 nCoV)    Coronavirus HKU1 NOT DETECTED NOT DETECTED Final   Coronavirus NL63 NOT DETECTED NOT DETECTED Final   Coronavirus OC43 NOT DETECTED NOT DETECTED Final   Metapneumovirus NOT DETECTED NOT DETECTED Final   Rhinovirus / Enterovirus NOT DETECTED NOT DETECTED Final   Influenza A NOT DETECTED NOT DETECTED Final   Influenza B NOT DETECTED NOT DETECTED Final   Parainfluenza Virus 1 NOT DETECTED NOT DETECTED Final   Parainfluenza Virus 2 NOT DETECTED NOT DETECTED Final   Parainfluenza Virus 3 NOT DETECTED NOT DETECTED Final   Parainfluenza Virus 4 NOT DETECTED NOT DETECTED Final   Respiratory Syncytial Virus NOT DETECTED NOT DETECTED Final   Bordetella pertussis NOT DETECTED NOT DETECTED Final   Bordetella Parapertussis NOT DETECTED NOT DETECTED Final   Chlamydophila pneumoniae NOT DETECTED NOT DETECTED Final   Mycoplasma pneumoniae NOT DETECTED NOT DETECTED Final    Comment: Performed at [facility], [address], [place], [place] [address]  Culture, blood (Routine X 2)     Status: None (Preliminary result)   Collection Time: 03/25/23  9:49 AM   Specimen: BLOOD  Result Value Ref Range Status   Specimen Description BLOOD SITE NOT SPECIFIED  Final   Special Requests   Final    BOTTLES DRAWN AEROBIC AND ANAEROBIC Blood Culture adequate volume   Culture   Final    NO GROWTH 3 DAYS Performed at [facility], [address] [place], [place] [address]    Report Status PENDING  Incomplete  Culture, blood (Routine X 2)     Status: None (Preliminary result)   Collection Time: 03/25/23 10:42 AM   Specimen: BLOOD LEFT HAND  Result Value Ref Range Status   Specimen Description BLOOD LEFT HAND  Final   Special Requests   Final    BOTTLES DRAWN AEROBIC ONLY Blood Culture results may not be optimal due to an inadequate volume of blood received in culture bottles   Culture   Final     NO GROWTH 3 DAYS Performed at [facility], [address], [place], [place] [address]    Report Status PENDING  Incomplete     Labs: BNP (last 3 results) No results for input(s): "BNP" in the last 8760 hours. Basic Metabolic Panel: Recent Labs  Lab 03/22/23 0839 03/23/23 0430 03/23/23 2208 03/24/23 0854 03/25/23 1052 03/26/23 0728 03/27/23 0049 03/28/23 0108  NA 126* 125*   < > 126* 130* 133* 134* 135  K 3.5 3.7   < > 3.3* 4.5 3.9 3.8 3.8  CL 100 100   < > 101 104 107 105 104  CO2 19* 17*   < > 19* 18* 18* 20* 19*  GLUCOSE 158* 127*   < > 140* 133* 112* 115* 118*  BUN 26* 20   < > 15 11 7* 7* 7*  CREATININE 1.14* 0.94   < >  0.90 0.91 0.86 0.82 0.78  CALCIUM 7.8* 7.0*   < > 7.1* 7.5* 7.6* 7.8* 8.0*  MG  --   --   --   --   --  1.3*  --  1.5*  PHOS 1.7* 2.7  --   --   --  2.7  --  3.4   < > = values in this interval not displayed.   Liver Function Tests: Recent Labs  Lab 03/23/23 0430 03/24/23 0854 03/25/23 1052 03/26/23 0728 03/28/23 0108  AST  --  40 66* 47* 39  ALT  --  29 46* 41 38  ALKPHOS  --  59 63 60 64  BILITOT  --  1.2 0.5 0.5 0.4  PROT  --  5.5* 5.9* 5.6* 6.2*  ALBUMIN 2.3* 2.3* 2.2* 2.3* 2.5*   No results for input(s): "LIPASE", "AMYLASE" in the last 168 hours. No results for input(s): "AMMONIA" in the last 168 hours. CBC: Recent Labs  Lab 03/24/23 0854 03/25/23 1052 03/26/23 0728 03/27/23 0049 03/28/23 0108  WBC 3.0* 3.2* 4.3 5.1 6.7  NEUTROABS 2.3 1.0* 1.4* 1.3* 1.8  HGB 8.9* 8.6* 9.0* 9.2* 9.1*  HCT 26.0* 25.9* 27.2* 26.9* 27.4*  MCV 91.5 93.2 92.5 93.4 94.8  PLT 131* 157 218 243 336   Cardiac Enzymes: No results for input(s): "CKTOTAL", "CKMB", "CKMBINDEX", "TROPONINI" in the last 168 hours. BNP: Invalid input(s): "POCBNP" CBG: Recent Labs  Lab 03/27/23 0802 03/27/23 1222 03/27/23 1620 03/27/23 2055 03/28/23 0825  GLUCAP 124* 173* 171* 142* 196*   D-Dimer No results for input(s): "DDIMER" in the last 72 hours. Hgb  A1c No results for input(s): "HGBA1C" in the last 72 hours. Lipid Profile No results for input(s): "CHOL", "HDL", "LDLCALC", "TRIG", "CHOLHDL", "LDLDIRECT" in the last 72 hours. Thyroid function studies No results for input(s): "TSH", "T4TOTAL", "T3FREE", "THYROIDAB" in the last 72 hours.  Invalid input(s): "FREET3" Anemia work up No results for input(s): "VITAMINB12", "FOLATE", "FERRITIN", "TIBC", "IRON", "RETICCTPCT" in the last 72 hours. Urinalysis    Component Value Date/Time   COLORURINE YELLOW 03/23/2023 2340   APPEARANCEUR CLEAR 03/23/2023 2340   APPEARANCEUR Clear 03/09/2023 1057   LABSPEC 1.013 03/23/2023 2340   PHURINE 5.0 03/23/2023 2340   GLUCOSEU NEGATIVE 03/23/2023 2340   HGBUR SMALL (A) 03/23/2023 2340   BILIRUBINUR NEGATIVE 03/23/2023 2340   BILIRUBINUR Negative 03/09/2023 1057   KETONESUR 5 (A) 03/23/2023 2340   PROTEINUR 30 (A) 03/23/2023 2340   UROBILINOGEN 0.2 05/07/2020 1000   NITRITE NEGATIVE 03/23/2023 2340   LEUKOCYTESUR NEGATIVE 03/23/2023 2340   Sepsis Labs Recent Labs  Lab 03/25/23 1052 03/26/23 0728 03/27/23 0049 03/28/23 0108  WBC 3.2* 4.3 5.1 6.7   Microbiology Recent Results (from the past 240 hour(s))  SARS Coronavirus 2 by RT PCR (hospital order, performed in [facility] hospital lab) *cepheid single result test* Anterior Nasal Swab     Status: None   Collection Time: 03/19/23 10:33 AM   Specimen: Anterior Nasal Swab  Result Value Ref Range Status   SARS Coronavirus 2 by RT PCR NEGATIVE NEGATIVE Final    Comment: (NOTE) SARS-CoV-2 target nucleic acids are NOT DETECTED.  The SARS-CoV-2 RNA is generally detectable in upper and lower respiratory specimens during the acute phase of infection. The lowest concentration of SARS-CoV-2 viral copies this assay can detect is 250 copies / mL. A negative result does not preclude SARS-CoV-2 infection and should not be used as the sole basis for treatment or other patient management decisions.  A  negative result may occur with improper specimen collection / handling, submission of specimen other than nasopharyngeal swab, presence of viral mutation(s) within the areas targeted by this assay, and inadequate number of viral copies (<250 copies / mL). A negative result must be combined with clinical observations, patient history, and epidemiological information.  Fact Sheet for Patients:   RoadLapTop.co.za  Fact Sheet for Healthcare Providers: http://kim-miller.com/  This test is not yet approved or  cleared by the [place] FDA and has been authorized for detection and/or diagnosis of SARS-CoV-2 by FDA under an Emergency Use Authorization (EUA).  This EUA will remain in effect (meaning this test can be used) for the duration of the COVID-19 declaration under Section 564(b)(1) of the Act, 21 U.S.C. section 360bbb-3(b)(1), unless the authorization is terminated or revoked sooner.  Performed at [facility], [address] [place], [place] [address]   Blood Culture (routine x 2)     Status: None   Collection Time: 03/19/23 10:36 AM   Specimen: BLOOD  Result Value Ref Range Status   Specimen Description BLOOD BLOOD LEFT ARM  Final   Special Requests   Final    BOTTLES DRAWN AEROBIC AND ANAEROBIC Blood Culture adequate volume   Culture   Final    NO GROWTH 5 DAYS Performed at [facility], [address] [place], [place] [address]    Report Status 03/24/2023 FINAL  Final  Blood Culture (routine x 2)     Status: None   Collection Time: 03/19/23 10:36 AM   Specimen: BLOOD  Result Value Ref Range Status   Specimen Description BLOOD BLOOD LEFT ARM  Final   Special Requests   Final    BOTTLES DRAWN AEROBIC AND ANAEROBIC Blood Culture adequate volume   Culture   Final    NO GROWTH 5 DAYS Performed at [facility], [address], [place], [place] [address]    Report Status 03/24/2023 FINAL  Final  Urine Culture (for pregnant,  neutropenic or urologic patients or patients with an indwelling urinary catheter)     Status: Abnormal   Collection Time: 03/20/23  4:50 PM   Specimen: Urine, Clean Catch  Result Value Ref Range Status   Specimen Description   Final    URINE, CLEAN CATCH Performed at [facility], [address], [place], [place] [address]    Special Requests   Final    NONE Performed at [facility], [address], [place], [place] [address]    Culture 20,000 COLONIES/mL STAPHYLOCOCCUS EPIDERMIDIS (A)  Final   Report Status 03/22/2023 FINAL  Final   Organism ID, Bacteria STAPHYLOCOCCUS EPIDERMIDIS (A)  Final      Susceptibility   Staphylococcus epidermidis - MIC*    CIPROFLOXACIN <=0.5 SENSITIVE Sensitive     GENTAMICIN <=0.5 SENSITIVE Sensitive     NITROFURANTOIN <=16 SENSITIVE Sensitive     OXACILLIN >=4 RESISTANT Resistant     TETRACYCLINE 2 SENSITIVE Sensitive     VANCOMYCIN 2 SENSITIVE Sensitive     TRIMETH/SULFA 80 RESISTANT Resistant     CLINDAMYCIN <=0.25 SENSITIVE Sensitive     RIFAMPIN <=0.5 SENSITIVE Sensitive     Inducible Clindamycin NEGATIVE Sensitive     * 20,000 COLONIES/mL STAPHYLOCOCCUS EPIDERMIDIS  Culture, blood (Routine X 2) w Reflex to ID Panel     Status: None   Collection Time: 03/21/23  1:34 PM   Specimen: BLOOD  Result Value Ref Range Status   Specimen Description BLOOD BLOOD LEFT HAND  Final  Special Requests   Final    BOTTLES DRAWN AEROBIC AND ANAEROBIC Blood Culture adequate volume   Culture   Final    NO GROWTH 5 DAYS Performed at [facility], [address] [place], [place] [address]    Report Status 03/26/2023 FINAL  Final  SARS Coronavirus 2 by RT PCR (hospital order, performed in [facility] hospital lab) *cepheid single result test* Anterior Nasal Swab     Status: None   Collection Time: 03/21/23  1:35 PM   Specimen: Anterior Nasal Swab  Result Value Ref Range Status   SARS Coronavirus 2 by RT PCR NEGATIVE NEGATIVE Final    Comment: (NOTE) SARS-CoV-2  target nucleic acids are NOT DETECTED.  The SARS-CoV-2 RNA is generally detectable in upper and lower respiratory specimens during the acute phase of infection. The lowest concentration of SARS-CoV-2 viral copies this assay can detect is 250 copies / mL. A negative result does not preclude SARS-CoV-2 infection and should not be used as the sole basis for treatment or other patient management decisions.  A negative result may occur with improper specimen collection / handling, submission of specimen other than nasopharyngeal swab, presence of viral mutation(s) within the areas targeted by this assay, and inadequate number of viral copies (<250 copies / mL). A negative result must be combined with clinical observations, patient history, and epidemiological information.  Fact Sheet for Patients:   RoadLapTop.co.za  Fact Sheet for Healthcare Providers: http://kim-miller.com/  This test is not yet approved or  cleared by the [place] FDA and has been authorized for detection and/or diagnosis of SARS-CoV-2 by FDA under an Emergency Use Authorization (EUA).  This EUA will remain in effect (meaning this test can be used) for the duration of the COVID-19 declaration under Section 564(b)(1) of the Act, 21 U.S.C. section 360bbb-3(b)(1), unless the authorization is terminated or revoked sooner.  Performed at [facility], [address], [place], [place] [address]   Culture, blood (Routine X 2) w Reflex to ID Panel     Status: None   Collection Time: 03/21/23  1:38 PM   Specimen: BLOOD  Result Value Ref Range Status   Specimen Description BLOOD BLOOD LEFT ARM  Final   Special Requests   Final    BOTTLES DRAWN AEROBIC AND ANAEROBIC Blood Culture adequate volume   Culture   Final    NO GROWTH 5 DAYS Performed at [facility], [address], [place], [place] [address]    Report Status 03/26/2023 FINAL  Final  Culture, blood (Routine X 2) w  Reflex to ID Panel     Status: None   Collection Time: 03/23/23 10:08 PM   Specimen: Left Antecubital; Blood  Result Value Ref Range Status   Specimen Description LEFT ANTECUBITAL  Final   Special Requests   Final    BOTTLES DRAWN AEROBIC AND ANAEROBIC Blood Culture results may not be optimal due to an excessive volume of blood received in culture bottles   Culture   Final    NO GROWTH 5 DAYS Performed at [facility], [address], [place], [place] [address]    Report Status 03/28/2023 FINAL  Final  Culture, blood (Routine X 2) w Reflex to ID Panel     Status: None   Collection Time: 03/23/23 10:25 PM   Specimen: BLOOD LEFT HAND  Result Value Ref Range Status   Specimen Description BLOOD LEFT HAND  Final   Special Requests   Final    BOTTLES DRAWN AEROBIC  ONLY Blood Culture adequate volume   Culture   Final    NO GROWTH 5 DAYS Performed at [facility], [address], [place], [place] [address]    Report Status 03/28/2023 FINAL  Final  Culture, blood (Routine X 2)     Status: None (Preliminary result)   Collection Time: 03/24/23  8:54 AM   Specimen: BLOOD LEFT HAND  Result Value Ref Range Status   Specimen Description   Final    BLOOD LEFT HAND BOTTLES DRAWN AEROBIC AND ANAEROBIC   Special Requests Blood Culture adequate volume  Final   Culture   Final    NO GROWTH 4 DAYS Performed at [facility], [address] [place], [place] [address]    Report Status PENDING  Incomplete  Culture, blood (Routine X 2)     Status: None (Preliminary result)   Collection Time: 03/24/23  8:54 AM   Specimen: BLOOD LEFT FOREARM  Result Value Ref Range Status   Specimen Description   Final    BLOOD LEFT FOREARM BOTTLES DRAWN AEROBIC AND ANAEROBIC   Special Requests Blood Culture adequate volume  Final   Culture   Final    NO GROWTH 4 DAYS Performed at [facility], [address], [place], [place] [address]    Report Status PENDING  Incomplete  Respiratory (~20 pathogens) panel by PCR      Status: None   Collection Time: 03/24/23 10:28 AM   Specimen: Nasopharyngeal Swab; Respiratory  Result Value Ref Range Status   Adenovirus NOT DETECTED NOT DETECTED Final   Coronavirus 229E NOT DETECTED NOT DETECTED Final    Comment: (NOTE) The Coronavirus on the Respiratory Panel, DOES NOT test for the novel  Coronavirus (2019 nCoV)    Coronavirus HKU1 NOT DETECTED NOT DETECTED Final   Coronavirus NL63 NOT DETECTED NOT DETECTED Final   Coronavirus OC43 NOT DETECTED NOT DETECTED Final   Metapneumovirus NOT DETECTED NOT DETECTED Final   Rhinovirus / Enterovirus NOT DETECTED NOT DETECTED Final   Influenza A NOT DETECTED NOT DETECTED Final   Influenza B NOT DETECTED NOT DETECTED Final   Parainfluenza Virus 1 NOT DETECTED NOT DETECTED Final   Parainfluenza Virus 2 NOT DETECTED NOT DETECTED Final   Parainfluenza Virus 3 NOT DETECTED NOT DETECTED Final   Parainfluenza Virus 4 NOT DETECTED NOT DETECTED Final   Respiratory Syncytial Virus NOT DETECTED NOT DETECTED Final   Bordetella pertussis NOT DETECTED NOT DETECTED Final   Bordetella Parapertussis NOT DETECTED NOT DETECTED Final   Chlamydophila pneumoniae NOT DETECTED NOT DETECTED Final   Mycoplasma pneumoniae NOT DETECTED NOT DETECTED Final    Comment: Performed at [facility], [address], [place], [place] [address]  Culture, blood (Routine X 2)     Status: None (Preliminary result)   Collection Time: 03/25/23  9:49 AM   Specimen: BLOOD  Result Value Ref Range Status   Specimen Description BLOOD SITE NOT SPECIFIED  Final   Special Requests   Final    BOTTLES DRAWN AEROBIC AND ANAEROBIC Blood Culture adequate volume   Culture   Final    NO GROWTH 3 DAYS Performed at [facility], [address] [place], [place] [address]    Report Status PENDING  Incomplete  Culture, blood (Routine X 2)     Status: None (Preliminary result)   Collection Time: 03/25/23 10:42 AM   Specimen: BLOOD LEFT HAND  Result Value Ref Range  Status   Specimen Description BLOOD LEFT HAND  Final   Special Requests   Final    BOTTLES DRAWN AEROBIC ONLY Blood Culture results may not be optimal due to an inadequate volume of blood received in culture bottles   Culture   Final    NO GROWTH 3 DAYS Performed at [facility], [address], [place], [place] [address]    Report Status PENDING  Incomplete     Time coordinating discharge:  I have spent 35 minutes face to face with the patient and on the ward discussing the patients care, assessment, plan and disposition with other care givers. >50% of the time was devoted counseling the patient about the risks and benefits of treatment/Discharge disposition and coordinating care.   SIGNED:   Dimple Nanas, MD  [facility] 03/28/2023, 12:57 PM   If 7PM-7AM, please contact night-coverage

## 2023-03-29 ENCOUNTER — Telehealth: Payer: Self-pay

## 2023-03-29 LAB — CULTURE, BLOOD (ROUTINE X 2)
Culture: NO GROWTH
Culture: NO GROWTH
Special Requests: ADEQUATE
Special Requests: ADEQUATE

## 2023-03-29 NOTE — Transitions of Care (Post Inpatient/ED Visit) (Signed)
03/29/2023  Name: Kimberly [name] MRN: [id number] DOB: 02-06-1951  Today's TOC FU Call Status: Today's TOC FU Call Status:: Successful TOC FU Call Completed  Transition Care Management Follow-up Telephone Call Date of Discharge: 03/28/23 Discharge Facility: [facility]) Type of Discharge: Inpatient Admission Primary Inpatient Discharge Diagnosis:: Acute Kidney Injury How have you been since you were released from the hospital?: Better (Patient notes she is moving slowly and her back hurts from the epidural puncture.  No redness or swelling at site) Any questions or concerns?: No  Items Reviewed: Did you receive and understand the discharge instructions provided?: Yes Medications obtained,verified, and reconciled?: Yes (Medications Reviewed) Any new allergies since your discharge?: No Dietary orders reviewed?: Yes Type of Diet Ordered:: Diabetic Do you have support at home?: Yes Name of Support/Comfort Primary Source: Jomarie Longs  Medications Reviewed Today: Medications Reviewed Today     Reviewed by Jodelle Gross, RN (Case Manager) on 03/29/23 at 1539  Med List Status: <None>   Medication Order Taking? Sig Documenting Provider Last Dose Status Informant  anastrozole (ARIMIDEX) 1 MG tablet [id number] Yes TAKE 1 TABLET DAILY Doreatha Massed, MD Taking Active Self, Pharmacy Records  gabapentin (NEURONTIN) 100 MG capsule [id number] Yes Take 1 capsule (100 mg total) by mouth 3 (three) times daily. Tommie Sams, DO Taking Active Self, Pharmacy Records           Med Note Jola Schmidt   Fri Mar 23, 2023  1:06 PM) Dispense records do not support.   glipiZIDE (GLUCOTROL XL) 5 MG 24 hr tablet [id number] Yes Take 1 tablet (5 mg total) by mouth daily with breakfast. Dani Gobble, NP Taking Active Self, Pharmacy Records  hydrochlorothiazide (HYDRODIURIL) 25 MG tablet [id number] Yes TAKE 1 TABLET DAILY Tommie Sams, DO Taking Active Self, Pharmacy Records  insulin degludec  (TRESIBA FLEXTOUCH) 100 UNIT/ML FlexTouch Pen [id number] Yes Inject 28 Units into the skin at bedtime. Dani Gobble, NP Taking Active Self, Pharmacy Records  levothyroxine (SYNTHROID) 125 MCG tablet [id number] Yes Take 1 tablet (125 mcg total) by mouth daily before breakfast. Dani Gobble, NP Taking Active Self, Pharmacy Records  losartan (COZAAR) 100 MG tablet [id number] Yes TAKE 1 TABLET EVERY MORNING Tommie Sams, DO Taking Active Self, Pharmacy Records           Med Note Jola Schmidt   Fri Mar 23, 2023  1:07 PM) Dispense records do not support.   Magnesium 250 MG TABS [id number]  Take 250 mg by mouth daily. [provider]  Active Self, Pharmacy Records           Med Note Jola Schmidt   Fri Mar 23, 2023  1:00 PM)    metFORMIN (GLUCOPHAGE-XR) 500 MG 24 hr tablet [id number] Yes Take 1 tablet (500 mg total) by mouth 2 (two) times daily with a meal. Dani Gobble, NP Taking Active Self, Pharmacy Records  metoprolol succinate (TOPROL-XL) 100 MG 24 hr tablet [id number] Yes TAKE 1 TABLET AT BEDTIME   WITH OR IMMEDIATELY        FOLLOWING A MEAL Tommie Sams, DO Taking Active Self, Pharmacy Records  miconazole (MICOTIN) 2 % powder [id number] Yes Apply topically as needed for itching. Tommie Sams, DO Taking Active Self, Pharmacy Records  mirabegron ER [facility]) 25 MG TB24 tablet [id number] Yes Take 1 tablet (25 mg total) by mouth daily. McKenzie, Mardene Celeste, MD Taking Active Self, Pharmacy Records  Med Note Jola Schmidt   Fri Mar 23, 2023  1:07 PM) No dispense record.   omeprazole (PRILOSEC) 20 MG capsule [id number] Yes Take 1 capsule (20 mg total) by mouth at bedtime. Tommie Sams, DO Taking Active Self, Pharmacy Records  tolterodine (DETROL LA) 4 MG 24 hr capsule [id number] Yes Take 1 capsule (4 mg total) by mouth daily. McKenzie, Mardene Celeste, MD Taking Active Self, Pharmacy Records  venlafaxine XR (EFFEXOR-XR) 75 MG 24 hr capsule [id number] Yes TAKE 1  CAPSULE BY MOUTH ONCE DAILY WITH BREAKFAST Tommie Sams, DO Taking Active Self, Pharmacy Records            Home Care and Equipment/Supplies: Were Home Health Services Ordered?: Yes [facility] of Home Health Agency[facility] Has Agency set up a time to come to your home?: No (Patient received call and they will call back tonight to schedule) Any new equipment or medical supplies ordered?: No  Functional Questionnaire: Do you need assistance with bathing/showering or dressing?: No Do you need assistance with meal preparation?: No Do you need assistance with eating?: No Do you have difficulty maintaining continence: No Do you need assistance with getting out of bed/getting out of a chair/moving?: No Do you have difficulty managing or taking your medications?: No  Follow up appointments reviewed: PCP Follow-up appointment confirmed?: Yes Date of PCP follow-up appointment?: 04/09/23 Follow-up Provider: Dr. Adriana Simas [facility] Follow-up appointment confirmed?: Yes Date of Specialist follow-up appointment?: 05/03/23 Follow-Up Specialty Provider:: Dr. Renold Don Do you need transportation to your follow-up appointment?: No Do you understand care options if your condition(s) worsen?: Yes-patient verbalized understanding  SDOH Interventions Today    Flowsheet Row Most Recent Value  SDOH Interventions   Food Insecurity Interventions Intervention Not Indicated  Housing Interventions Intervention Not Indicated  Transportation Interventions Intervention Not Indicated     Jodelle Gross, RN, BSN, CCM Care Management Coordinator [facility] Phone: [phone]/Fax: [phone]

## 2023-03-30 DIAGNOSIS — N179 Acute kidney failure, unspecified: Secondary | ICD-10-CM | POA: Diagnosis not present

## 2023-03-30 DIAGNOSIS — K219 Gastro-esophageal reflux disease without esophagitis: Secondary | ICD-10-CM | POA: Diagnosis not present

## 2023-03-30 DIAGNOSIS — F32A Depression, unspecified: Secondary | ICD-10-CM | POA: Diagnosis not present

## 2023-03-30 DIAGNOSIS — Z794 Long term (current) use of insulin: Secondary | ICD-10-CM | POA: Diagnosis not present

## 2023-03-30 DIAGNOSIS — E782 Mixed hyperlipidemia: Secondary | ICD-10-CM | POA: Diagnosis not present

## 2023-03-30 DIAGNOSIS — Z556 Problems related to health literacy: Secondary | ICD-10-CM | POA: Diagnosis not present

## 2023-03-30 DIAGNOSIS — Z6829 Body mass index (BMI) 29.0-29.9, adult: Secondary | ICD-10-CM | POA: Diagnosis not present

## 2023-03-30 DIAGNOSIS — A419 Sepsis, unspecified organism: Secondary | ICD-10-CM | POA: Diagnosis not present

## 2023-03-30 DIAGNOSIS — I129 Hypertensive chronic kidney disease with stage 1 through stage 4 chronic kidney disease, or unspecified chronic kidney disease: Secondary | ICD-10-CM | POA: Diagnosis not present

## 2023-03-30 DIAGNOSIS — K7581 Nonalcoholic steatohepatitis (NASH): Secondary | ICD-10-CM | POA: Diagnosis not present

## 2023-03-30 DIAGNOSIS — G43109 Migraine with aura, not intractable, without status migrainosus: Secondary | ICD-10-CM | POA: Diagnosis not present

## 2023-03-30 DIAGNOSIS — E1122 Type 2 diabetes mellitus with diabetic chronic kidney disease: Secondary | ICD-10-CM | POA: Diagnosis not present

## 2023-03-30 DIAGNOSIS — E860 Dehydration: Secondary | ICD-10-CM | POA: Diagnosis not present

## 2023-03-30 DIAGNOSIS — E669 Obesity, unspecified: Secondary | ICD-10-CM | POA: Diagnosis not present

## 2023-03-30 DIAGNOSIS — F419 Anxiety disorder, unspecified: Secondary | ICD-10-CM | POA: Diagnosis not present

## 2023-03-30 DIAGNOSIS — E876 Hypokalemia: Secondary | ICD-10-CM | POA: Diagnosis not present

## 2023-03-30 DIAGNOSIS — N189 Chronic kidney disease, unspecified: Secondary | ICD-10-CM | POA: Diagnosis not present

## 2023-03-30 DIAGNOSIS — I728 Aneurysm of other specified arteries: Secondary | ICD-10-CM | POA: Diagnosis not present

## 2023-03-30 DIAGNOSIS — E039 Hypothyroidism, unspecified: Secondary | ICD-10-CM | POA: Diagnosis not present

## 2023-03-30 DIAGNOSIS — Z7984 Long term (current) use of oral hypoglycemic drugs: Secondary | ICD-10-CM | POA: Diagnosis not present

## 2023-03-30 DIAGNOSIS — E871 Hypo-osmolality and hyponatremia: Secondary | ICD-10-CM | POA: Diagnosis not present

## 2023-03-30 DIAGNOSIS — G0490 Encephalitis and encephalomyelitis, unspecified: Secondary | ICD-10-CM | POA: Diagnosis not present

## 2023-03-30 DIAGNOSIS — C50411 Malignant neoplasm of upper-outer quadrant of right female breast: Secondary | ICD-10-CM | POA: Diagnosis not present

## 2023-04-02 DIAGNOSIS — N179 Acute kidney failure, unspecified: Secondary | ICD-10-CM | POA: Diagnosis not present

## 2023-04-02 DIAGNOSIS — C50411 Malignant neoplasm of upper-outer quadrant of right female breast: Secondary | ICD-10-CM | POA: Diagnosis not present

## 2023-04-02 DIAGNOSIS — I129 Hypertensive chronic kidney disease with stage 1 through stage 4 chronic kidney disease, or unspecified chronic kidney disease: Secondary | ICD-10-CM | POA: Diagnosis not present

## 2023-04-02 DIAGNOSIS — E1122 Type 2 diabetes mellitus with diabetic chronic kidney disease: Secondary | ICD-10-CM | POA: Diagnosis not present

## 2023-04-02 DIAGNOSIS — N189 Chronic kidney disease, unspecified: Secondary | ICD-10-CM | POA: Diagnosis not present

## 2023-04-02 DIAGNOSIS — A419 Sepsis, unspecified organism: Secondary | ICD-10-CM | POA: Diagnosis not present

## 2023-04-04 ENCOUNTER — Other Ambulatory Visit: Payer: Self-pay | Admitting: Urology

## 2023-04-04 DIAGNOSIS — R32 Unspecified urinary incontinence: Secondary | ICD-10-CM

## 2023-04-05 DIAGNOSIS — I129 Hypertensive chronic kidney disease with stage 1 through stage 4 chronic kidney disease, or unspecified chronic kidney disease: Secondary | ICD-10-CM | POA: Diagnosis not present

## 2023-04-05 DIAGNOSIS — N189 Chronic kidney disease, unspecified: Secondary | ICD-10-CM | POA: Diagnosis not present

## 2023-04-05 DIAGNOSIS — C50411 Malignant neoplasm of upper-outer quadrant of right female breast: Secondary | ICD-10-CM | POA: Diagnosis not present

## 2023-04-05 DIAGNOSIS — A419 Sepsis, unspecified organism: Secondary | ICD-10-CM | POA: Diagnosis not present

## 2023-04-05 DIAGNOSIS — E1122 Type 2 diabetes mellitus with diabetic chronic kidney disease: Secondary | ICD-10-CM | POA: Diagnosis not present

## 2023-04-05 DIAGNOSIS — N179 Acute kidney failure, unspecified: Secondary | ICD-10-CM | POA: Diagnosis not present

## 2023-04-06 ENCOUNTER — Inpatient Hospital Stay: Payer: Medicare Other | Admitting: Family Medicine

## 2023-04-09 ENCOUNTER — Ambulatory Visit (INDEPENDENT_AMBULATORY_CARE_PROVIDER_SITE_OTHER): Payer: Medicare Other | Admitting: Family Medicine

## 2023-04-09 DIAGNOSIS — D649 Anemia, unspecified: Secondary | ICD-10-CM | POA: Diagnosis not present

## 2023-04-09 DIAGNOSIS — N390 Urinary tract infection, site not specified: Secondary | ICD-10-CM

## 2023-04-09 DIAGNOSIS — G9341 Metabolic encephalopathy: Secondary | ICD-10-CM | POA: Diagnosis not present

## 2023-04-09 DIAGNOSIS — R413 Other amnesia: Secondary | ICD-10-CM | POA: Diagnosis not present

## 2023-04-09 DIAGNOSIS — I10 Essential (primary) hypertension: Secondary | ICD-10-CM | POA: Diagnosis not present

## 2023-04-09 NOTE — Patient Instructions (Signed)
Labs today.  Referrals placed.  Follow up in 1 month.

## 2023-04-10 DIAGNOSIS — K760 Fatty (change of) liver, not elsewhere classified: Secondary | ICD-10-CM | POA: Insufficient documentation

## 2023-04-10 DIAGNOSIS — D649 Anemia, unspecified: Secondary | ICD-10-CM | POA: Insufficient documentation

## 2023-04-10 DIAGNOSIS — N390 Urinary tract infection, site not specified: Secondary | ICD-10-CM | POA: Insufficient documentation

## 2023-04-10 DIAGNOSIS — G9341 Metabolic encephalopathy: Secondary | ICD-10-CM | POA: Insufficient documentation

## 2023-04-10 NOTE — Assessment & Plan Note (Addendum)
Now resolved.  Hospital course reviewed.  Outpatient neurology follow-up per hospitalist. Patient to also have follow-up with infectious disease.

## 2023-04-10 NOTE — Progress Notes (Signed)
Subjective:  Patient ID: Kimberly [name], female    DOB: 1951-08-21  Age: 72 y.o. MRN: [id number]  CC: Hospital follow-up   HPI:  72 year old female with hypertension, hepatic steatosis, type 2 diabetes, hypothyroidism, history of breast cancer, hyperlipidemia presents for hospital follow-up.  Patient recently admitted from 7/22 to 7/31.  Hospital course, labs, notes, and discharge summary reviewed.  Summary: Patient presented with fever and altered mental status.  Was treated for UTI prior to admission.  Upon admission she had acute kidney injury.  Was treated empirically with antibiotic therapy.  Had essentially an unremarkable workup in regards to source of fever.  Patient's altered mental status was thought to be secondary to metabolic encephalopathy.  Patient had multiple metabolic derangements.  Fever subsequently resolved and patient was discharged home in stable condition.  Patient presents today with her daughter.  She is overall feeling better but still not back to her baseline.  She is sleeping quite a bit and is fatigued.  No further fever.  Patient and daughter would like to see another urologist given recurrent UTIs.  Additionally, she would like to see a neurologist as an outpatient.  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) 09/05/2021   Vitamin D deficiency 09/05/2021   Breast cancer of upper-outer quadrant of right female breast (HCC) 08/31/2021   S/P mastectomy, right 07/29/2021   Urinary incontinence 05/07/2020   Depression 12/29/2015   S/P knee replacement 09/02/2013   Essential hypertension, benign 07/06/2013   Hypothyroidism 07/06/2013    Social Hx   Social History   Socioeconomic History    Marital status: Married    Spouse name: Not on file   Number of children: 5   Years of education: Not on file   Highest education level: Not on file  Occupational History   Occupation: CNA    Employer: [employer]  Tobacco Use   Smoking status: Never   Smokeless tobacco: Never  Vaping Use   Vaping status: Never Used  Substance and Sexual Activity   Alcohol use: No   Drug use: No   Sexual activity: Yes    Birth control/protection: Surgical  Other Topics Concern   Not on file  Social History Narrative   Not on file   Social Determinants of Health   Financial Resource Strain: Low Risk  (02/16/2023)   Overall Financial Resource Strain (CARDIA)    Difficulty of Paying Living Expenses: Not hard at all  Food Insecurity: No Food Insecurity (03/29/2023)   Hunger Vital Sign    Worried About Running Out of Food in the Last Year: Never true    Ran Out of Food in the Last Year: Never true  Transportation Needs: No Transportation Needs (03/29/2023)   PRAPARE - Administrator, Civil Service (Medical): No    Lack of Transportation (Non-Medical): No  Physical Activity: Insufficiently Active (02/16/2023)   Exercise Vital Sign    Days of Exercise per Week: 2 days    Minutes of Exercise per Session: 30 min  Stress: No Stress Concern Present (02/16/2023)   [employer] of Occupational Health - Occupational Stress Questionnaire    Feeling of Stress : Not at all  Social Connections: Socially Integrated (02/16/2023)  Social Advertising account executive [NHANES]    Frequency of Communication with Friends and Family: More than three times a week    Frequency of Social Gatherings with Friends and Family: More than three times a week    Attends Religious Services: More than 4 times per year    Active Member of [employer] or Organizations: Yes    Attends Engineer, structural: More than 4 times per year    Marital Status: Married    Review of Systems Per HPI  Objective:   BP 136/86   Pulse 76   Temp 97.9 F (36.6 C)   Ht 5\' 3"  (1.6 m)   Wt 175 lb 9.6 oz (79.7 kg)   SpO2 99%   BMI 31.11 kg/m      04/09/2023    2:31 PM 03/28/2023    7:46 AM 03/28/2023    5:52 AM  BP/Weight  Systolic BP 136 140 149  Diastolic BP 86 52 69  Wt. (Lbs) 175.6    BMI 31.11 kg/m2      Physical Exam Vitals and nursing note reviewed.  Constitutional:      Appearance: Normal appearance. She is obese.  HENT:     Head: Normocephalic and atraumatic.  Cardiovascular:     Rate and Rhythm: Normal rate and regular rhythm.  Pulmonary:     Effort: Pulmonary effort is normal.     Breath sounds: Normal breath sounds. No wheezing, rhonchi or rales.  Neurological:     Mental Status: She is alert.  Psychiatric:        Mood and Affect: Mood normal.        Behavior: Behavior normal.     Lab Results  Component Value Date   WBC 8.6 04/09/2023   HGB 10.5 (L) 04/09/2023   HCT 30.9 (L) 04/09/2023   PLT 316 04/09/2023   GLUCOSE 117 (H) 04/09/2023   CHOL 158 07/31/2022   TRIG 180 (H) 07/31/2022   HDL 39 (L) 07/31/2022   LDLCALC 88 07/31/2022   ALT 15 04/09/2023   AST 21 04/09/2023   NA 139 04/09/2023   K 4.2 04/09/2023   CL 99 04/09/2023   CREATININE 1.05 (H) 04/09/2023   BUN 19 04/09/2023   CO2 24 04/09/2023   TSH 0.249 (L) 03/19/2023   INR 1.2 03/19/2023   HGBA1C 6.4 (H) 03/19/2023   MICROALBUR 10 mg/L 08/09/2022     Assessment & Plan:   Problem List Items Addressed This Visit       Cardiovascular and Mediastinum   Essential hypertension, benign    Hypertension is stable.  Patient has back on her home medications.      Relevant Orders   CMP14+EGFR (Completed)     Nervous and Auditory   Metabolic encephalopathy    Now resolved.  Hospital course reviewed.  Outpatient neurology follow-up per hospitalist. Patient to also have follow-up with infectious disease.      Relevant Orders   Ambulatory referral to Neurology     Genitourinary   Recurrent UTI     Patient and family member desire to see another urologist.  [facility]erring to [facility].      Relevant Orders   Ambulatory referral to Urology     Other   Anemia    Labs reviewed.  Patient anemic in the setting of recent hospitalization/acute illness.  Rechecking CBC.      Relevant Orders   CBC (Completed)   Iron, TIBC and Ferritin Panel (Completed)   Other  Visit Diagnoses     Memory changes       Relevant Orders   Ambulatory referral to Neurology       Follow-up:  Return in about 1 month (around 05/10/2023).  Everlene Other DO [facility]

## 2023-04-10 NOTE — Assessment & Plan Note (Signed)
Labs reviewed.  Patient anemic in the setting of recent hospitalization/acute illness.  Rechecking CBC.

## 2023-04-10 NOTE — Assessment & Plan Note (Addendum)
Hypertension is stable.  Patient has back on her home medications.

## 2023-04-10 NOTE — Assessment & Plan Note (Signed)
Patient and family member desire to see another urologist.  Referring to [facility].

## 2023-04-12 ENCOUNTER — Other Ambulatory Visit: Payer: Self-pay

## 2023-04-12 DIAGNOSIS — E039 Hypothyroidism, unspecified: Secondary | ICD-10-CM | POA: Diagnosis not present

## 2023-04-12 DIAGNOSIS — N179 Acute kidney failure, unspecified: Secondary | ICD-10-CM | POA: Diagnosis not present

## 2023-04-12 DIAGNOSIS — I129 Hypertensive chronic kidney disease with stage 1 through stage 4 chronic kidney disease, or unspecified chronic kidney disease: Secondary | ICD-10-CM | POA: Diagnosis not present

## 2023-04-12 DIAGNOSIS — G0490 Encephalitis and encephalomyelitis, unspecified: Secondary | ICD-10-CM | POA: Diagnosis not present

## 2023-04-12 DIAGNOSIS — E782 Mixed hyperlipidemia: Secondary | ICD-10-CM | POA: Diagnosis not present

## 2023-04-12 DIAGNOSIS — C50411 Malignant neoplasm of upper-outer quadrant of right female breast: Secondary | ICD-10-CM | POA: Diagnosis not present

## 2023-04-12 DIAGNOSIS — R32 Unspecified urinary incontinence: Secondary | ICD-10-CM

## 2023-04-12 DIAGNOSIS — A419 Sepsis, unspecified organism: Secondary | ICD-10-CM | POA: Diagnosis not present

## 2023-04-12 DIAGNOSIS — E871 Hypo-osmolality and hyponatremia: Secondary | ICD-10-CM | POA: Diagnosis not present

## 2023-04-12 DIAGNOSIS — E860 Dehydration: Secondary | ICD-10-CM | POA: Diagnosis not present

## 2023-04-12 DIAGNOSIS — N189 Chronic kidney disease, unspecified: Secondary | ICD-10-CM | POA: Diagnosis not present

## 2023-04-12 DIAGNOSIS — F32A Depression, unspecified: Secondary | ICD-10-CM | POA: Diagnosis not present

## 2023-04-12 DIAGNOSIS — E1122 Type 2 diabetes mellitus with diabetic chronic kidney disease: Secondary | ICD-10-CM | POA: Diagnosis not present

## 2023-04-12 MED ORDER — TOLTERODINE TARTRATE ER 4 MG PO CP24: 4.0000 mg | ORAL_CAPSULE | Freq: Every day | ORAL | 3 refills | Status: DC

## 2023-04-13 ENCOUNTER — Other Ambulatory Visit: Payer: Self-pay

## 2023-04-13 ENCOUNTER — Encounter: Payer: Self-pay | Admitting: Physician Assistant

## 2023-04-13 DIAGNOSIS — D649 Anemia, unspecified: Secondary | ICD-10-CM

## 2023-04-13 DIAGNOSIS — I10 Essential (primary) hypertension: Secondary | ICD-10-CM

## 2023-04-16 ENCOUNTER — Other Ambulatory Visit: Payer: Self-pay | Admitting: Family Medicine

## 2023-04-25 ENCOUNTER — Ambulatory Visit: Payer: Medicare Other | Admitting: Nurse Practitioner

## 2023-04-25 DIAGNOSIS — D649 Anemia, unspecified: Secondary | ICD-10-CM | POA: Diagnosis not present

## 2023-04-25 DIAGNOSIS — I10 Essential (primary) hypertension: Secondary | ICD-10-CM | POA: Diagnosis not present

## 2023-04-26 LAB — CBC WITH DIFFERENTIAL/PLATELET
Basophils Absolute: 0.1 10*3/uL (ref 0.0–0.2)
Basos: 1 %
EOS (ABSOLUTE): 0.3 10*3/uL (ref 0.0–0.4)
Eos: 4 %
Hematocrit: 32.6 % — ABNORMAL LOW (ref 34.0–46.6)
Hemoglobin: 10.7 g/dL — ABNORMAL LOW (ref 11.1–15.9)
Immature Grans (Abs): 0 10*3/uL (ref 0.0–0.1)
Immature Granulocytes: 0 %
Lymphocytes Absolute: 3.6 10*3/uL — ABNORMAL HIGH (ref 0.7–3.1)
Lymphs: 46 %
MCH: 32.4 pg (ref 26.6–33.0)
MCHC: 32.8 g/dL (ref 31.5–35.7)
MCV: 99 fL — ABNORMAL HIGH (ref 79–97)
Monocytes Absolute: 0.6 10*3/uL (ref 0.1–0.9)
Monocytes: 8 %
Neutrophils Absolute: 3.1 10*3/uL (ref 1.4–7.0)
Neutrophils: 41 %
Platelets: 326 10*3/uL (ref 150–450)
RBC: 3.3 x10E6/uL — ABNORMAL LOW (ref 3.77–5.28)
RDW: 15.1 % (ref 11.7–15.4)
WBC: 7.6 10*3/uL (ref 3.4–10.8)

## 2023-04-26 LAB — BASIC METABOLIC PANEL WITH GFR
BUN/Creatinine Ratio: 17 (ref 12–28)
BUN: 15 mg/dL (ref 8–27)
CO2: 23 mmol/L (ref 20–29)
Calcium: 9.6 mg/dL (ref 8.7–10.3)
Chloride: 101 mmol/L (ref 96–106)
Creatinine, Ser: 0.9 mg/dL (ref 0.57–1.00)
Glucose: 116 mg/dL — ABNORMAL HIGH (ref 70–99)
Potassium: 4.4 mmol/L (ref 3.5–5.2)
Sodium: 139 mmol/L (ref 134–144)
eGFR: 68 mL/min/1.73

## 2023-05-02 ENCOUNTER — Encounter: Payer: Self-pay | Admitting: Family Medicine

## 2023-05-02 ENCOUNTER — Other Ambulatory Visit: Payer: Self-pay

## 2023-05-02 ENCOUNTER — Ambulatory Visit (INDEPENDENT_AMBULATORY_CARE_PROVIDER_SITE_OTHER): Payer: Medicare Other | Admitting: Family Medicine

## 2023-05-02 VITALS — BP 138/70 | Temp 97.5°F | Wt 176.8 lb

## 2023-05-02 DIAGNOSIS — Z23 Encounter for immunization: Secondary | ICD-10-CM | POA: Diagnosis not present

## 2023-05-02 DIAGNOSIS — N390 Urinary tract infection, site not specified: Secondary | ICD-10-CM

## 2023-05-02 DIAGNOSIS — D649 Anemia, unspecified: Secondary | ICD-10-CM

## 2023-05-02 DIAGNOSIS — F320 Major depressive disorder, single episode, mild: Secondary | ICD-10-CM

## 2023-05-02 MED ORDER — LOSARTAN POTASSIUM 100 MG PO TABS: 100.0000 mg | ORAL_TABLET | Freq: Every morning | ORAL | 0 refills | Status: DC

## 2023-05-02 NOTE — Assessment & Plan Note (Signed)
Phone number given for [facility].  Patient can call and schedule appointment.  Referral has been placed.

## 2023-05-02 NOTE — Patient Instructions (Addendum)
Continue your medications.  Call [facility] - Referral has been sent.  [phone]  Follow up in 6 months.

## 2023-05-02 NOTE — Progress Notes (Signed)
Subjective:  Patient ID: Kimberly [name], female    DOB: 10/06/1950  Age: 72 y.o. MRN: [id number]  CC: Follow up   HPI:  72 year old female presents for follow-up.  Patient states that she is feeling slightly down and depressed but overall is dramatically improved from her hospitalization.  She states that she is waking up early in the morning abruptly.  She is able to fall asleep without difficulty.  Energy levels have improved.  She is interested in swimming again.  She has an appointment scheduled to see neurology.  She has follow-up with infectious disease doing.  Referral has been placed to urology regarding recurrent UTI.  I advised patient to call as she has not yet received an appointment and the referral has been sent through on our end.   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) 09/05/2021   Vitamin D deficiency 09/05/2021   Breast cancer of upper-outer quadrant of right female breast (HCC) 08/31/2021   S/P mastectomy, right 07/29/2021   Urinary incontinence 05/07/2020   Depression 12/29/2015   S/P knee replacement 09/02/2013   Essential hypertension, benign 07/06/2013   Hypothyroidism 07/06/2013    Social Hx   Social History   Socioeconomic History   Marital status: Married    Spouse name: Not on file   Number of children: 5   Years of education: Not on file   Highest education level: Not on file  Occupational History   Occupation: CNA    Employer: [employer]  Tobacco Use   Smoking status: Never   Smokeless tobacco: Never  Vaping Use   Vaping status: Never Used  Substance and Sexual Activity   Alcohol use: No   Drug use: No   Sexual activity: Yes    Birth control/protection: Surgical  Other Topics Concern   Not on file   Social History Narrative   Not on file   Social Determinants of Health   Financial Resource Strain: Low Risk  (02/16/2023)   Overall Financial Resource Strain (CARDIA)    Difficulty of Paying Living Expenses: Not hard at all  Food Insecurity: No Food Insecurity (03/29/2023)   Hunger Vital Sign    Worried About Running Out of Food in the Last Year: Never true    Ran Out of Food in the Last Year: Never true  Transportation Needs: No Transportation Needs (03/29/2023)   PRAPARE - Administrator, Civil Service (Medical): No    Lack of Transportation (Non-Medical): No  Physical Activity: Insufficiently Active (02/16/2023)   Exercise Vital Sign    Days of Exercise per Week: 2 days    Minutes of Exercise per Session: 30 min  Stress: No Stress Concern Present (02/16/2023)   [employer] of Occupational Health - Occupational Stress Questionnaire    Feeling of Stress : Not at all  Social Connections: Socially Integrated (02/16/2023)   Social Connection and Isolation Panel [NHANES]    Frequency of Communication with Friends and Family: More than three times a week    Frequency of Social Gatherings with Friends and Family: More than three times a week    Attends Religious Services: More than 4 times per year    Active Member of Clubs or Organizations: Yes    Attends  Engineer, structural: More than 4 times per year    Marital Status: Married    Review of Systems Per HPI  Objective:  BP 138/70   Temp (!) 97.5 F (36.4 C) (Oral)   Wt 176 lb 12.8 oz (80.2 kg)   SpO2 99%   BMI 31.32 kg/m      05/02/2023   10:17 AM 05/02/2023    9:42 AM 04/09/2023    2:31 PM  BP/Weight  Systolic BP 138 145 136  Diastolic BP 70 76 86  Wt. (Lbs)  176.8 175.6  BMI  31.32 kg/m2 31.11 kg/m2    Physical Exam Constitutional:      General: She is not in acute distress.    Appearance: Normal appearance.  HENT:     Head: Normocephalic and atraumatic.  Eyes:     General:        Right  eye: No discharge.        Left eye: No discharge.     Conjunctiva/sclera: Conjunctivae normal.  Cardiovascular:     Rate and Rhythm: Normal rate and regular rhythm.  Pulmonary:     Effort: Pulmonary effort is normal.     Breath sounds: Normal breath sounds. No wheezing, rhonchi or rales.  Neurological:     Mental Status: She is alert.  Psychiatric:        Mood and Affect: Mood normal.        Behavior: Behavior normal.     Lab Results  Component Value Date   WBC 7.6 04/25/2023   HGB 10.7 (L) 04/25/2023   HCT 32.6 (L) 04/25/2023   PLT 326 04/25/2023   GLUCOSE 116 (H) 04/25/2023   CHOL 158 07/31/2022   TRIG 180 (H) 07/31/2022   HDL 39 (L) 07/31/2022   LDLCALC 88 07/31/2022   ALT 15 04/09/2023   AST 21 04/09/2023   NA 139 04/25/2023   K 4.4 04/25/2023   CL 101 04/25/2023   CREATININE 0.90 04/25/2023   BUN 15 04/25/2023   CO2 23 04/25/2023   TSH 0.249 (L) 03/19/2023   INR 1.2 03/19/2023   HGBA1C 6.4 (H) 03/19/2023   MICROALBUR 10 mg/L 08/09/2022     Assessment & Plan:   Problem List Items Addressed This Visit       Genitourinary   Recurrent UTI - Primary    Phone number given for [facility].  Patient can call and schedule appointment.  Referral has been placed.        Other   Depression    Mild worsening recently.  Continuing Effexor.  I anticipate this will continue to improve as she gets back to her normal activities.      Other Visit Diagnoses     Needs flu shot       Relevant Orders   Flu Vaccine Trivalent High Dose (Fluad) (Completed)       Follow-up:  Return in about 6 months (around 10/30/2023).  Everlene Other DO [facility]

## 2023-05-02 NOTE — Assessment & Plan Note (Addendum)
Mild worsening recently.  Continuing Effexor.  I anticipate this will continue to improve as she gets back to her normal activities.

## 2023-05-03 ENCOUNTER — Ambulatory Visit: Payer: Medicare Other | Admitting: Nurse Practitioner

## 2023-05-03 ENCOUNTER — Other Ambulatory Visit: Payer: Self-pay

## 2023-05-03 ENCOUNTER — Encounter: Payer: Self-pay | Admitting: Internal Medicine

## 2023-05-03 ENCOUNTER — Ambulatory Visit (INDEPENDENT_AMBULATORY_CARE_PROVIDER_SITE_OTHER): Payer: Medicare Other | Admitting: Internal Medicine

## 2023-05-03 VITALS — BP 132/74 | HR 80 | Temp 97.9°F | Ht 63.0 in | Wt 177.0 lb

## 2023-05-03 DIAGNOSIS — G0490 Encephalitis and encephalomyelitis, unspecified: Secondary | ICD-10-CM | POA: Diagnosis not present

## 2023-05-03 NOTE — Patient Instructions (Signed)
I have asked [facility] to follow up with state about the [place] virus and other encephalitis virus testing  Send me a mychart message next week and I'll see if it's back and let you know   Otherwise you don't have to follow up with me

## 2023-05-03 NOTE — Progress Notes (Signed)
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   MRN: [id number]  Chief Complaint  Patient presents with   Follow-up    HPI:  MICKELL VANDEWALLE is a 72 y.o. female here for f/u of hospital admission for encephalitis  She is normal now Arbovirus is still pending  No complaint today   Allergies  Allergen Reactions   Ace Inhibitors Cough      Outpatient Medications Prior to Visit  Medication Sig Dispense Refill   anastrozole (ARIMIDEX) 1 MG tablet TAKE 1 TABLET DAILY 90 tablet 3   gabapentin (NEURONTIN) 100 MG capsule Take 1 capsule (100 mg total) by mouth 3 (three) times daily. 270 capsule 1   glipiZIDE (GLUCOTROL XL) 5 MG 24 hr tablet Take 1 tablet (5 mg total) by mouth daily with breakfast. 90 tablet 3   hydrochlorothiazide (HYDRODIURIL) 25 MG tablet TAKE 1 TABLET DAILY 90 tablet 0   insulin degludec (TRESIBA FLEXTOUCH) 100 UNIT/ML FlexTouch Pen Inject 28 Units into the skin at bedtime. 28 mL 3   levothyroxine (SYNTHROID) 125 MCG tablet Take 1 tablet (125 mcg total) by mouth daily before breakfast. 90 tablet 3   losartan (COZAAR) 100 MG tablet Take 1 tablet (100 mg total) by mouth every morning. 90 tablet 0   Magnesium 250 MG TABS Take 250 mg by mouth daily.     metFORMIN  (GLUCOPHAGE-XR) 500 MG 24 hr tablet Take 1 tablet (500 mg total) by mouth 2 (two) times daily with a meal. 180 tablet 3   metoprolol succinate (TOPROL-XL) 100 MG 24 hr tablet TAKE 1 TABLET AT BEDTIME   WITH OR IMMEDIATELY        FOLLOWING A MEAL 90 tablet 0   miconazole (MICOTIN) 2 % powder Apply topically as needed for itching. 70 g 0   omeprazole (PRILOSEC) 20 MG capsule Take 1 capsule (20 mg total) by mouth at bedtime. 90 capsule 3   tolterodine (DETROL LA) 4 MG 24 hr capsule Take 1 capsule (4 mg total) by mouth daily. 90 capsule 3   venlafaxine XR (EFFEXOR-XR) 75 MG 24 hr capsule TAKE 1 CAPSULE BY MOUTH ONCE DAILY WITH BREAKFAST 90 capsule 3   Facility-Administered Medications Prior to Visit  Medication Dose Route Frequency Provider Last Rate Last Admin   bupivacaine liposome (EXPAREL) 1.3 % injection 266 mg  20 mL Infiltration Once Vickki Hearing, MD         Social History   Socioeconomic History   Marital status: Married    Spouse name: Not on file   Number of children: 5   Years of education: Not on file   Highest education level: Not on file  Occupational History   Occupation: CNA    Employer: [employer]  Tobacco Use  Smoking status: Never   Smokeless tobacco: Never  Vaping Use   Vaping status: Never Used  Substance and Sexual Activity   Alcohol use: No   Drug use: No   Sexual activity: Yes    Birth control/protection: Surgical  Other Topics Concern   Not on file  Social History Narrative   Not on file   Social Determinants of Health   Financial Resource Strain: Low Risk  (02/16/2023)   Overall Financial Resource Strain (CARDIA)    Difficulty of Paying Living Expenses: Not hard at all  Food Insecurity: No Food Insecurity (03/29/2023)   Hunger Vital Sign    Worried About Running Out of Food in the Last Year: Never true    Ran Out of Food in the Last Year: Never true  Transportation Needs: No Transportation Needs (03/29/2023)   PRAPARE - Therapist, art (Medical): No    Lack of Transportation (Non-Medical): No  Physical Activity: Insufficiently Active (02/16/2023)   Exercise Vital Sign    Days of Exercise per Week: 2 days    Minutes of Exercise per Session: 30 min  Stress: No Stress Concern Present (02/16/2023)   [employer] of Occupational Health - Occupational Stress Questionnaire    Feeling of Stress : Not at all  Social Connections: Socially Integrated (02/16/2023)   Social Connection and Isolation Panel [NHANES]    Frequency of Communication with Friends and Family: More than three times a week    Frequency of Social Gatherings with Friends and Family: More than three times a week    Attends Religious Services: More than 4 times per year    Active Member of [employer] or Organizations: Yes    Attends Engineer, structural: More than 4 times per year    Marital Status: Married  Catering manager Violence: Not At Risk (03/19/2023)   Humiliation, Afraid, Rape, and Kick questionnaire    Fear of Current or Ex-Partner: No    Emotionally Abused: No    Physically Abused: No    Sexually Abused: No      Review of Systems     Objective:    BP 132/74   Pulse 80   Temp 97.9 F (36.6 C) (Temporal)   Ht 5\' 3"  (1.6 m)   Wt 177 lb (80.3 kg)   SpO2 97%   BMI 31.35 kg/m  Nursing note and vital signs reviewed.  Physical Exam     General/constitutional: no distress, pleasant HEENT: Normocephalic, PER, Conj Clear, EOMI, Oropharynx clear Neck supple CV: rrr no mrg Lungs: clear to auscultation, normal respiratory effort Abd: Soft, Nontender Ext: no edema Skin: No Rash Neuro: nonfocal MSK: no peripheral joint swelling/tenderness/warmth; back spines nontender    Labs: Lab Results  Component Value Date   WBC 7.6 04/25/2023   HGB 10.7 (L) 04/25/2023   HCT 32.6 (L) 04/25/2023   MCV 99 (H) 04/25/2023   PLT 326 04/25/2023   Last metabolic panel Lab Results  Component Value Date   GLUCOSE 116  (H) 04/25/2023   NA 139 04/25/2023   K 4.4 04/25/2023   CL 101 04/25/2023   CO2 23 04/25/2023   BUN 15 04/25/2023   CREATININE 0.90 04/25/2023   EGFR 68 04/25/2023   CALCIUM 9.6 04/25/2023   PHOS 3.4 03/28/2023   PROT 7.6 04/09/2023   ALBUMIN 4.2 04/09/2023   LABGLOB 3.4 04/09/2023   AGRATIO 1.3 10/03/2021   BILITOT 0.4 04/09/2023   ALKPHOS 92 04/09/2023   AST  21 04/09/2023   ALT 15 04/09/2023   ANIONGAP 12 03/28/2023    Micro:  Serology:  Imaging:  Assessment & Plan:   Problem List Items Addressed This Visit   None Visit Diagnoses     Encephalitis    -  Primary         No orders of the defined types were placed in this encounter.    Date of Admission:  03/19/2023                                      Abx: 7/27-28 acyclovir 7/25-27 cipro                                        7/12-22 bactrim   Assessment: 72 yo female dm2, ckd, depression, hx breast cancer s/p bilateral mastectomy, gerd, hypothryoidism admitted 7/22 with fever and confusion without clear cause   Patient had received abx bactrim for presumed uti 7/12 from urology. Ucx ecoli (appear to have had ecoli colonization of urinary tract0   Her wbc here is normal initially and now down trending along with mild thrombocytopenia. There is no gi sx/rash.   7/23 ucx 20k staph epi, not clinically significant, and not a uti pathogen 7/22, 7/24, 7/26, 7/27 bcx ngtd; 7/27 respiratory viral pcr negative   I suspect encephalitidies possibly arbovirus vs enterovirus. There is reactive lymphocytes/smudge cells on peripheral smear which could be suggestive of viral process   [place] livestocks (although she does have close by farms that do have livestock), very active with outside the house chores. She has a Engineer, petroleum. We will consider q-fever or psittacosis   She does have hx of breast cancer and previously in 12/2022 similar ams and reported uti sx. All cultures  including lp and urine negative; csf without pleocytosis or other abnormality. Either improved with abx or self resolved. If no clear cause for this episode, will have to think about autoimmune/paraneoplastic process   Do not suspect tick related illness such as lyme or spotted fever process     -------------- 7/30 id assessment ME pcr negative; brain mri no acute pathology Leukopenia resolved Ams seems to resolved today 7/30 Pending arbovirus, psittacosis lab, and qfever lab     Afebrile x3 days     Neurology no further w/u planned    --------------- 05/03/23 id clinic assessment Previous id tests Chlamydia psittaci negative Rpr/tppa negative Qfever negative Serum arbovirus (tate lab) still pending  She still have parakeet and chickens But she is back to 100% now  I have asked our microbiology lab to follow up on the state testing for arbor virus  F/u as needed     Follow-up: No follow-ups on file.      Raymondo Band, MD [facility] 05/03/2023, 3:09 PM

## 2023-05-09 ENCOUNTER — Encounter: Payer: Self-pay | Admitting: Physician Assistant

## 2023-05-14 DIAGNOSIS — D649 Anemia, unspecified: Secondary | ICD-10-CM | POA: Diagnosis not present

## 2023-05-15 LAB — VITAMIN B12: Vitamin B-12: 352 pg/mL (ref 232–1245)

## 2023-05-15 LAB — FOLATE: Folate: 7 ng/mL (ref >3.0)

## 2023-05-28 ENCOUNTER — Other Ambulatory Visit: Payer: Self-pay | Admitting: Nurse Practitioner

## 2023-05-29 ENCOUNTER — Ambulatory Visit (INDEPENDENT_AMBULATORY_CARE_PROVIDER_SITE_OTHER): Payer: Medicare Other | Admitting: Nurse Practitioner

## 2023-05-29 ENCOUNTER — Encounter: Payer: Self-pay | Admitting: Nurse Practitioner

## 2023-05-29 VITALS — BP 134/54 | HR 82 | Ht 63.0 in | Wt 174.4 lb

## 2023-05-29 DIAGNOSIS — Z7984 Long term (current) use of oral hypoglycemic drugs: Secondary | ICD-10-CM | POA: Diagnosis not present

## 2023-05-29 DIAGNOSIS — N1831 Chronic kidney disease, stage 3a: Secondary | ICD-10-CM | POA: Diagnosis not present

## 2023-05-29 DIAGNOSIS — Z794 Long term (current) use of insulin: Secondary | ICD-10-CM | POA: Diagnosis not present

## 2023-05-29 DIAGNOSIS — I10 Essential (primary) hypertension: Secondary | ICD-10-CM | POA: Diagnosis not present

## 2023-05-29 DIAGNOSIS — E1122 Type 2 diabetes mellitus with diabetic chronic kidney disease: Secondary | ICD-10-CM | POA: Diagnosis not present

## 2023-05-29 DIAGNOSIS — E039 Hypothyroidism, unspecified: Secondary | ICD-10-CM

## 2023-05-29 DIAGNOSIS — E782 Mixed hyperlipidemia: Secondary | ICD-10-CM

## 2023-05-29 MED ORDER — METFORMIN HCL ER 500 MG PO TB24: 500.0000 mg | ORAL_TABLET | Freq: Two times a day (BID) | ORAL | 3 refills | Status: DC

## 2023-05-29 MED ORDER — LEVOTHYROXINE SODIUM 125 MCG PO TABS: 125.0000 ug | ORAL_TABLET | Freq: Every day | ORAL | 3 refills | Status: DC

## 2023-05-29 MED ORDER — FAMOTIDINE 20 MG PO TABS
20.0000 mg | ORAL_TABLET | Freq: Every day | ORAL | 1 refills | Status: DC
Start: 2023-05-29 — End: 2023-09-04

## 2023-05-29 MED ORDER — GLIPIZIDE ER 5 MG PO TB24: 5.0000 mg | ORAL_TABLET | Freq: Every day | ORAL | 3 refills | Status: DC

## 2023-05-29 MED ORDER — TRESIBA FLEXTOUCH 100 UNIT/ML ~~LOC~~ SOPN: 28.0000 [IU] | PEN_INJECTOR | Freq: Every day | SUBCUTANEOUS | 3 refills | Status: DC

## 2023-05-29 NOTE — Progress Notes (Signed)
Endocrinology Follow Up Note       05/29/2023, 5:06 PM   Subjective:    Patient ID: Kimberly [name], female    DOB: Jan 02, 1951.  Kimberly [name] is being seen in follow up after being seen in consultation for management of currently uncontrolled symptomatic diabetes requested by  Tommie Sams, DO.   Past Medical History:  Diagnosis Date   Anxiety    Arthritis    Depression    Diabetes mellitus    Family history of breast cancer    Family history of colon cancer    Family history of prostate cancer    Family history of skin cancer    GERD (gastroesophageal reflux disease)    High blood pressure    Hypothyroid    Obesity    PONV (postoperative nausea and vomiting)     Past Surgical History:  Procedure Laterality Date   ABDOMINAL HYSTERECTOMY     Bunions Right    CARPAL TUNNEL RELEASE Right    KNEE ARTHROSCOPY Right    MASTECTOMY MODIFIED RADICAL Right 07/29/2021   invasive ductal ca   THYROID SURGERY     removal   TOTAL KNEE ARTHROPLASTY Right 08/25/2013   Procedure: TOTAL KNEE ARTHROPLASTY;  Surgeon: Vickki Hearing, MD;  Location: [facility];  Service: Orthopedics;  Laterality: Right;   TOTAL KNEE ARTHROPLASTY Left 09/09/2014   Procedure: LEFT TOTAL KNEE ARTHROPLASTY;  Surgeon: Vickki Hearing, MD;  Location: [facility];  Service: Orthopedics;  Laterality: Left;    Social History   Socioeconomic History   Marital status: Married    Spouse name: Not on file   Number of children: 5   Years of education: Not on file   Highest education level: Not on file  Occupational History   Occupation: CNA    Employer: [employer]  Tobacco Use   Smoking status: Never   Smokeless tobacco: Never  Vaping Use   Vaping status: Never Used  Substance and Sexual Activity   Alcohol use: No   Drug use: No   Sexual activity: Yes    Birth control/protection: Surgical  Other Topics Concern   Not on file   Social History Narrative   Not on file   Social Determinants of Health   Financial Resource Strain: Low Risk  (02/16/2023)   Overall Financial Resource Strain (CARDIA)    Difficulty of Paying Living Expenses: Not hard at all  Food Insecurity: No Food Insecurity (03/29/2023)   Hunger Vital Sign    Worried About Running Out of Food in the Last Year: Never true    Ran Out of Food in the Last Year: Never true  Transportation Needs: No Transportation Needs (03/29/2023)   PRAPARE - Administrator, Civil Service (Medical): No    Lack of Transportation (Non-Medical): No  Physical Activity: Insufficiently Active (02/16/2023)   Exercise Vital Sign    Days of Exercise per Week: 2 days    Minutes of Exercise per Session: 30 min  Stress: No Stress Concern Present (02/16/2023)   [employer] of Occupational Health - Occupational Stress Questionnaire    Feeling of Stress : Not at all  Social Connections:  Socially Integrated (02/16/2023)   Social Connection and Isolation Panel [NHANES]    Frequency of Communication with Friends and Family: More than three times a week    Frequency of Social Gatherings with Friends and Family: More than three times a week    Attends Religious Services: More than 4 times per year    Active Member of [employer] or Organizations: Yes    Attends Engineer, structural: More than 4 times per year    Marital Status: Married    Family History  Problem Relation Age of Onset   Diabetes Mother    Hypertension Mother    Congestive Heart Failure Father    Hypertension Father    Skin cancer Father 77       squamous cell on lip; metastatic   Hypertension Brother    Skin cancer Brother        squamous cell   Breast cancer Paternal Aunt        dx 28s d. 72s   Prostate cancer Paternal Uncle        dx 58s   Colon cancer Maternal Grandmother        dx 31s d. 68s   Diabetes Other    Cancer Cousin        unk type; possibly pancreatic    Outpatient  Encounter Medications as of 05/29/2023  Medication Sig   anastrozole (ARIMIDEX) 1 MG tablet TAKE 1 TABLET DAILY   famotidine (PEPCID) 20 MG tablet Take 1 tablet (20 mg total) by mouth at bedtime.   gabapentin (NEURONTIN) 100 MG capsule Take 1 capsule (100 mg total) by mouth 3 (three) times daily.   hydrochlorothiazide (HYDRODIURIL) 25 MG tablet TAKE 1 TABLET DAILY   losartan (COZAAR) 100 MG tablet Take 1 tablet (100 mg total) by mouth every morning.   Magnesium 250 MG TABS Take 250 mg by mouth daily.   metoprolol succinate (TOPROL-XL) 100 MG 24 hr tablet TAKE 1 TABLET AT BEDTIME   WITH OR IMMEDIATELY        FOLLOWING A MEAL   miconazole (MICOTIN) 2 % powder Apply topically as needed for itching.   omeprazole (PRILOSEC) 20 MG capsule Take 1 capsule (20 mg total) by mouth at bedtime.   tolterodine (DETROL LA) 4 MG 24 hr capsule Take 1 capsule (4 mg total) by mouth daily.   venlafaxine XR (EFFEXOR-XR) 75 MG 24 hr capsule TAKE 1 CAPSULE BY MOUTH ONCE DAILY WITH BREAKFAST   [DISCONTINUED] glipiZIDE (GLUCOTROL XL) 5 MG 24 hr tablet Take 1 tablet (5 mg total) by mouth daily with breakfast.   [DISCONTINUED] insulin degludec (TRESIBA FLEXTOUCH) 100 UNIT/ML FlexTouch Pen Inject 28 Units into the skin at bedtime.   [DISCONTINUED] levothyroxine (SYNTHROID) 125 MCG tablet Take 1 tablet (125 mcg total) by mouth daily before breakfast.   [DISCONTINUED] metFORMIN (GLUCOPHAGE-XR) 500 MG 24 hr tablet Take 1 tablet (500 mg total) by mouth 2 (two) times daily with a meal.   glipiZIDE (GLUCOTROL XL) 5 MG 24 hr tablet Take 1 tablet (5 mg total) by mouth daily with breakfast.   insulin degludec (TRESIBA FLEXTOUCH) 100 UNIT/ML FlexTouch Pen Inject 28 Units into the skin at bedtime.   levothyroxine (SYNTHROID) 125 MCG tablet Take 1 tablet (125 mcg total) by mouth daily before breakfast.   metFORMIN (GLUCOPHAGE-XR) 500 MG 24 hr tablet Take 1 tablet (500 mg total) by mouth 2 (two) times daily with a meal.   [DISCONTINUED]  insulin degludec (TRESIBA FLEXTOUCH) 100 UNIT/ML FlexTouch Pen  Inject 28 Units into the skin at bedtime.   [DISCONTINUED] insulin degludec (TRESIBA FLEXTOUCH) 100 UNIT/ML FlexTouch Pen Inject 28 Units into the skin at bedtime.   Facility-Administered Encounter Medications as of 05/29/2023  Medication   bupivacaine liposome (EXPAREL) 1.3 % injection 266 mg    ALLERGIES: Allergies  Allergen Reactions   Ace Inhibitors Cough    VACCINATION STATUS: Immunization History  Administered Date(s) Administered   Fluad Quad(high Dose 65+) 07/12/2020   Fluad Trivalent(High Dose 65+) 05/02/2023   Influenza,inj,Quad PF,6+ Mos 04/30/2018, 08/24/2022   Influenza-Unspecified 05/21/2016, 05/03/2017, 06/21/2019   Moderna Sars-Covid-2 Vaccination 10/07/2019, 11/05/2019, 08/02/2020   Pneumococcal Conjugate-13 03/03/2014, 05/30/2017   Pneumococcal Polysaccharide-23 05/27/2010    Diabetes She presents for her follow-up diabetic visit. She has type 2 diabetes mellitus. Onset time: Diagnosed at approx age of 51. Her disease course has been improving. There are no hypoglycemic associated symptoms. Pertinent negatives for hypoglycemia include no nervousness/anxiousness or tremors. Associated symptoms include foot paresthesias. Pertinent negatives for diabetes include no fatigue, no polydipsia, no polyuria and no weight loss. There are no hypoglycemic complications. Symptoms are stable. Diabetic complications include nephropathy and peripheral neuropathy. Risk factors for coronary artery disease include diabetes mellitus, dyslipidemia, family history, obesity, post-menopausal and hypertension. Current diabetic treatment includes oral agent (dual therapy) and insulin injections. She is compliant with treatment most of the time. Her weight is decreasing steadily. She is following a generally healthy diet. Meal planning includes avoidance of concentrated sweets. She has had a previous visit with a dietitian. She  participates in exercise three times a week. Her home blood glucose trend is fluctuating minimally. (She presents today with her CGM showing mostly at goal glycemic profile.  Her most recent A1c, checked on 7/22 during hospitalization for encephalitis (of unknown cause) was 6.4%.  Analysis of her CGM shows TIR 70%, TAR 30%, TBR 0% with a GMI of 7.1%.) An ACE inhibitor/angiotensin II receptor blocker is being taken. She sees a podiatrist (has seen one in the distant past).Eye exam is current.  Hyperlipidemia This is a chronic problem. The current episode started more than 1 year ago. The problem is uncontrolled. Recent lipid tests were reviewed and are variable. Exacerbating diseases include chronic renal disease, diabetes, hypothyroidism and obesity. Factors aggravating her hyperlipidemia include thiazides and beta blockers. Current antihyperlipidemic treatment includes diet change and exercise. The current treatment provides mild improvement of lipids. There are no compliance problems.  Risk factors for coronary artery disease include diabetes mellitus, dyslipidemia, family history, hypertension, obesity and post-menopausal.  Hypertension This is a chronic problem. The current episode started more than 1 year ago. The problem has been resolved since onset. The problem is controlled. Pertinent negatives include no palpitations. Agents associated with hypertension include thyroid hormones. Risk factors for coronary artery disease include diabetes mellitus, dyslipidemia, family history, obesity and post-menopausal state. Past treatments include angiotensin blockers, beta blockers and diuretics. The current treatment provides moderate improvement. There are no compliance problems.  Hypertensive end-organ damage includes kidney disease. Identifiable causes of hypertension include chronic renal disease and a thyroid problem.  Thyroid Problem Presents for follow-up visit. Patient reports no anxiety, cold  intolerance, constipation, depressed mood, fatigue, heat intolerance, leg swelling, palpitations, tremors, weight gain or weight loss. The symptoms have been stable. Past treatments include levothyroxine. The treatment provided significant relief. Her past medical history is significant for diabetes, hyperlipidemia and neuropathy (? vs chronic back issues).     Review of systems  Constitutional: + steadily decreasing body weight,  current Body mass index is 30.89 kg/m. , + fatigue, no subjective hyperthermia, no subjective hypothermia Eyes: no blurry vision, no xerophthalmia ENT: no sore throat, no nodules palpated in throat, no dysphagia/odynophagia, no hoarseness Cardiovascular: no chest pain, no shortness of breath, no palpitations, no leg swelling Respiratory: no cough, no shortness of breath Gastrointestinal: no nausea/vomiting/diarrhea Musculoskeletal: + diffuse muscle/joint aches- improving Skin: no rashes, no hyperemia Neurological: no tremors, no numbness, no tingling, no dizziness Psychiatric: no depression, no anxiety  Objective:     BP (!) 134/54 (BP Location: Left Arm, Patient Position: Sitting, Cuff Size: Large)   Pulse 82   Ht 5\' 3"  (1.6 m)   Wt 174 lb 6.4 oz (79.1 kg)   BMI 30.89 kg/m   Wt Readings from Last 3 Encounters:  05/29/23 174 lb 6.4 oz (79.1 kg)  05/03/23 177 lb (80.3 kg)  05/02/23 176 lb 12.8 oz (80.2 kg)     BP Readings from Last 3 Encounters:  05/29/23 (!) 134/54  05/03/23 132/74  05/02/23 138/70      Physical Exam- Limited  Constitutional:  Body mass index is 30.89 kg/m. , not in acute distress, normal state of mind Eyes:  EOMI, no exophthalmos Musculoskeletal: no gross deformities, strength intact in all four extremities, no gross restriction of joint movements Skin:  no rashes, no hyperemia Neurological: no tremor with outstretched hands   Diabetic Foot Exam - Simple   No data filed      CMP ( most recent) CMP     Component  Value Date/Time   NA 139 04/25/2023 1030   K 4.4 04/25/2023 1030   CL 101 04/25/2023 1030   CO2 23 04/25/2023 1030   GLUCOSE 116 (H) 04/25/2023 1030   GLUCOSE 118 (H) 03/28/2023 0108   BUN 15 04/25/2023 1030   CREATININE 0.90 04/25/2023 1030   CREATININE 1.06 08/11/2014 0701   CALCIUM 9.6 04/25/2023 1030   CALCIUM 10.9 (H) 12/28/2022 1452   PROT 7.6 04/09/2023 1547   ALBUMIN 4.2 04/09/2023 1547   AST 21 04/09/2023 1547   ALT 15 04/09/2023 1547   ALKPHOS 92 04/09/2023 1547   BILITOT 0.4 04/09/2023 1547   GFRNONAA >60 03/28/2023 0108   GFRAA 65 10/11/2020 1146     Diabetic Labs (most recent): Lab Results  Component Value Date   HGBA1C 6.4 (H) 03/19/2023   HGBA1C 6.9 (A) 12/25/2022   HGBA1C 6.9 (A) 08/09/2022   MICROALBUR 10 mg/L 08/09/2022   MICROALBUR 1.1 06/03/2014   MICROALBUR 3.48 (H) 07/09/2013     Lipid Panel ( most recent) Lipid Panel     Component Value Date/Time   CHOL 158 07/31/2022 0816   TRIG 180 (H) 07/31/2022 0816   HDL 39 (L) 07/31/2022 0816   CHOLHDL 4.1 07/31/2022 0816   CHOLHDL 3.7 06/03/2014 0709   VLDL 40 06/03/2014 0709   LDLCALC 88 07/31/2022 0816   LABVLDL 31 07/31/2022 0816      Lab Results  Component Value Date   TSH 0.249 (L) 03/19/2023   TSH 0.490 01/16/2023   TSH 0.414 (L) 07/31/2022   TSH 0.240 (L) 01/27/2022   TSH 0.133 (L) 10/03/2021   TSH 0.653 04/11/2021   TSH 1.490 04/19/2020   TSH 2.470 07/31/2019   TSH 0.528 04/22/2018   TSH 0.256 (L) 12/26/2017   FREET4 1.13 (H) 03/26/2023   FREET4 1.33 07/31/2022   FREET4 1.31 01/27/2022   FREET4 1.65 10/03/2021           Assessment & Plan:  1) Type 2 diabetes mellitus with diabetic nephropathy, without long-term current use of insulin (HCC)  She presents today with her CGM showing mostly at goal glycemic profile.  Her most recent A1c, checked on 7/22 during hospitalization for encephalitis (of unknown cause) was 6.4%.  Analysis of her CGM shows TIR 70%, TAR 30%, TBR 0%  with a GMI of 7.1%.  - Kimberly [name] has currently uncontrolled symptomatic type 2 DM since 72 years of age.  -Recent labs reviewed.  - I had a long discussion with her about the progressive nature of diabetes and the pathology behind its complications. -her diabetes is complicated by mild CKD, peripheral neuropathy and she remains at a high risk for more acute and chronic complications which include CAD, CVA, CKD, retinopathy, and neuropathy. These are all discussed in detail with her.  - Nutritional counseling repeated at each appointment due to patients tendency to fall back in to old habits.  - The patient admits there is a room for improvement in their diet and drink choices. -  Suggestion is made for the patient to avoid simple carbohydrates from their diet including Cakes, Sweet Desserts / Pastries, Ice Cream, Soda (diet and regular), Sweet Tea, Candies, Chips, Cookies, Sweet Pastries, Store Bought Juices, Alcohol in Excess of 1-2 drinks a day, Artificial Sweeteners, Coffee Creamer, and "Sugar-free" Products. This will help patient to have stable blood glucose profile and potentially avoid unintended weight gain.   - I encouraged the patient to switch to unprocessed or minimally processed complex starch and increased protein intake (animal or plant source), fruits, and vegetables.   - Patient is advised to stick to a routine mealtimes to eat 3 meals a day and avoid unnecessary snacks (to snack only to correct hypoglycemia).  - she declines referral to Norm Salt, RDN, CDE for diabetes education at this time.  - I have approached her with the following individualized plan to manage her diabetes and patient agrees:   -Given her stable, mostly at goal glycemic profile, no changes will be made to medications today.  She is advised to continue her Tresiba 28 units SQ nightly, Metformin 500 mg ER po twice daily with meals and Glipizide 5 mg XL daily with breakfast for now.    -she is  encouraged to continue monitoring blood glucose twice daily (using her CGM), before breakfast and before bed, and to call the clinic if she has readings less than 70 or above 200 for 3 tests in a row.   She is benefiting from CGM device.  - she will be considered for incretin therapy as appropriate next visit.  - Specific targets for  A1c; LDL, HDL, and Triglycerides were discussed with the patient.  2) Blood Pressure /Hypertension:  her blood pressure is controlled to target.   she is advised to continue her current medications including HCTZ 25 mg po daily, Losartan 100 mg p.o. daily with breakfast, and Metoprolol 100 mg po daily.  3) Lipids/Hyperlipidemia:    Review of her recent lipid panel from 07/31/22 showed controlled LDL at 88 and elevated triglycerides of 180 (improved).  She prefers to stay away from statins at this time.  She is advised to avoid fried foods and butter.   Will recheck lipid panel prior to next visit.  4)  Weight/Diet:  her Body mass index is 30.89 kg/m.  -  clearly complicating her diabetes care.   she is a candidate for weight loss. I discussed with her the fact that loss  of 5 - 10% of her  current body weight will have the most impact on her diabetes management.  Exercise, and detailed carbohydrates information provided  -  detailed on discharge instructions.  5) Vitamin D Deficiency Her recent vitamin D level was 36.2 on 12/11/22- checked by her oncologist. She is also undergoing evaluation for hypercalcemia with him as well.  6) Hypothyroidism-unspecified There are no recent TFTs to review.  She is advised to continue Levothyroxine 125 mcg po daily before breakfast.  Will recheck TFTs prior to next visit and adjust dosage accordingly.   - The correct intake of thyroid hormone (Levothyroxine, Synthroid), is on empty stomach first thing in the morning, with water, separated by at least 30 minutes from breakfast and other medications,  and separated by more than 4  hours from calcium, iron, multivitamins, acid reflux medications (PPIs).  - This medication is a life-long medication and will be needed to correct thyroid hormone imbalances for the rest of your life.  The dose may change from time to time, based on thyroid blood work.  - It is extremely important to be consistent taking this medication, near the same time each morning.  -AVOID TAKING PRODUCTS CONTAINING BIOTIN (commonly found in Hair, Skin, Nails vitamins) AS IT INTERFERES WITH THE VALIDITY OF THYROID FUNCTION BLOOD TESTS.  6) Chronic Care/Health Maintenance: -she is on ACEI/ARB medications and is encouraged to initiate and continue to follow up with Ophthalmology, Dentist, Podiatrist at least yearly or according to recommendations, and advised to stay away from smoking. I have recommended yearly flu vaccine and pneumonia vaccine at least every 5 years; moderate intensity exercise for up to 150 minutes weekly; and sleep for at least 7 hours a day.  - she is advised to maintain close follow up with Tommie Sams, DO for primary care needs, as well as her other providers for optimal and coordinated care.      I spent  39  minutes in the care of the patient today including review of labs from CMP, Lipids, Thyroid Function, Hematology (current and previous including abstractions from other facilities); face-to-face time discussing  her blood glucose readings/logs, discussing hypoglycemia and hyperglycemia episodes and symptoms, medications doses, her options of short and long term treatment based on the latest standards of care / guidelines;  discussion about incorporating lifestyle medicine;  and documenting the encounter. Risk reduction counseling performed per USPSTF guidelines to reduce obesity and cardiovascular risk factors.     Please refer to Patient Instructions for Blood Glucose Monitoring and Insulin/Medications Dosing Guide"  in media tab for additional information. Please  also refer to  " Patient Self Inventory" in the Media  tab for reviewed elements of pertinent patient history.  Kimberly [name] participated in the discussions, expressed understanding, and voiced agreement with the above plans.  All questions were answered to her satisfaction. she is encouraged to contact clinic should she have any questions or concerns prior to her return visit.     Follow up plan: - Return in about 4 months (around 09/29/2023) for Diabetes F/U with A1c in office, Previsit labs, Thyroid follow up, Bring meter and logs.   Ronny Bacon, [facility] [address] [place], [place] [address] Phone: [phone] Fax: [phone]  05/29/2023, 5:06 PM

## 2023-05-29 NOTE — Patient Instructions (Signed)

## 2023-06-20 ENCOUNTER — Ambulatory Visit
Admission: EM | Admit: 2023-06-20 | Discharge: 2023-06-20 | Disposition: A | Discharge status: Home / Self Care | Payer: Medicare Other | Attending: Family Medicine | Admitting: Family Medicine

## 2023-06-20 DIAGNOSIS — R109 Unspecified abdominal pain: Secondary | ICD-10-CM | POA: Diagnosis not present

## 2023-06-20 DIAGNOSIS — R21 Rash and other nonspecific skin eruption: Secondary | ICD-10-CM | POA: Insufficient documentation

## 2023-06-20 LAB — POCT URINALYSIS DIP (MANUAL ENTRY)
Bilirubin, UA: NEGATIVE
Blood, UA: NEGATIVE
Glucose, UA: NEGATIVE mg/dL
Ketones, POC UA: NEGATIVE mg/dL
Nitrite, UA: NEGATIVE
Protein Ur, POC: NEGATIVE mg/dL
Spec Grav, UA: 1.015 (ref 1.010–1.025)
Urobilinogen, UA: 0.2 U/dL
pH, UA: 7 (ref 5.0–8.0)

## 2023-06-20 MED ORDER — CLINDAMYCIN PHOSPHATE 1 % EX GEL: Freq: Two times a day (BID) | CUTANEOUS | 0 refills | Status: DC

## 2023-06-20 NOTE — ED Provider Notes (Signed)
[facility]    CSN: [id number] Arrival date & time: 06/20/23  1055      History   Chief Complaint Chief Complaint  Patient presents with   Flank Pain    HPI Kimberly [name] is a 72 y.o. female.   Patient presenting today with 4-day history of bilateral back/flank pain.  States it is aching in nature and seems constant.  Somewhat improved with getting up and walking around, worse with laying down and applying pressure.  Does have some urinary frequency but states this is not abnormal for her and she denies dysuria, hematuria, fever, chills, nausea, vomiting.  History of severe kidney infections and recurrent UTIs that have felt similar.  So far not tried anything over-the-counter for symptoms.  She is also having a facial rash that started this morning.  No new products or exposures that she is aware of.  Does not itch or hurt but does have some pustules.  Denies any past history of similar.    Past Medical History:  Diagnosis Date   Anxiety    Arthritis    Depression    Diabetes mellitus    Family history of breast cancer    Family history of colon cancer    Family history of prostate cancer    Family history of skin cancer    GERD (gastroesophageal reflux disease)    High blood pressure    Hypothyroid    Obesity    PONV (postoperative nausea and vomiting)     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) 09/05/2021   Vitamin D deficiency 09/05/2021   Breast cancer of upper-outer quadrant of right female breast (HCC) 08/31/2021   S/P mastectomy, right 07/29/2021   Urinary incontinence 05/07/2020   Depression 12/29/2015   S/P knee replacement 09/02/2013   Essential hypertension, benign 07/06/2013   Hypothyroidism 07/06/2013     Past Surgical History:  Procedure Laterality Date   ABDOMINAL HYSTERECTOMY     Bunions Right    CARPAL TUNNEL RELEASE Right    KNEE ARTHROSCOPY Right    MASTECTOMY MODIFIED RADICAL Right 07/29/2021   invasive ductal ca   THYROID SURGERY     removal   TOTAL KNEE ARTHROPLASTY Right 08/25/2013   Procedure: TOTAL KNEE ARTHROPLASTY;  Surgeon: Vickki Hearing, MD;  Location: [facility];  Service: Orthopedics;  Laterality: Right;   TOTAL KNEE ARTHROPLASTY Left 09/09/2014   Procedure: LEFT TOTAL KNEE ARTHROPLASTY;  Surgeon: Vickki Hearing, MD;  Location: [facility];  Service: Orthopedics;  Laterality: Left;    OB History   No obstetric history on file.      Home Medications    Prior to Admission medications   Medication Sig Start Date End Date Taking? Authorizing Provider  clindamycin (CLINDAGEL) 1 % gel Apply topically 2 (two) times daily. 06/20/23  Yes Particia Nearing, PA-C  anastrozole (ARIMIDEX) 1 MG tablet TAKE 1 TABLET DAILY 07/25/22   Doreatha Massed, MD  famotidine (PEPCID) 20 MG tablet Take 1 tablet (20 mg total) by mouth at bedtime. 05/29/23   Dani Gobble, NP  gabapentin (NEURONTIN) 100 MG capsule Take 1 capsule (100 mg total) by mouth 3 (three) times daily. 11/25/22   Tommie Sams, DO  glipiZIDE (GLUCOTROL XL) 5  MG 24 hr tablet Take 1 tablet (5 mg total) by mouth daily with breakfast. 05/29/23   Dani Gobble, NP  hydrochlorothiazide (HYDRODIURIL) 25 MG tablet TAKE 1 TABLET DAILY 03/13/23   Everlene Other G, DO  insulin degludec (TRESIBA FLEXTOUCH) 100 UNIT/ML FlexTouch Pen Inject 28 Units into the skin at bedtime. 05/29/23   Dani Gobble, NP  levothyroxine (SYNTHROID) 125 MCG tablet Take 1 tablet (125 mcg total) by mouth daily before breakfast. 05/29/23   Dani Gobble, NP  losartan (COZAAR) 100 MG tablet Take 1 tablet (100 mg total) by mouth every morning. 05/02/23   Tommie Sams, DO  Magnesium 250 MG TABS Take 250 mg by mouth daily.    [provider]  metFORMIN (GLUCOPHAGE-XR) 500 MG 24 hr tablet Take 1 tablet (500 mg total) by mouth 2 (two) times daily with a meal. 05/29/23   Dani Gobble, NP  metoprolol succinate (TOPROL-XL) 100 MG 24 hr tablet TAKE 1 TABLET AT BEDTIME   WITH OR IMMEDIATELY        FOLLOWING A MEAL 03/13/23   Cook, [name], DO  miconazole (MICOTIN) 2 % powder Apply topically as needed for itching. 01/29/23   Tommie Sams, DO  omeprazole (PRILOSEC) 20 MG capsule Take 1 capsule (20 mg total) by mouth at bedtime. 01/29/23   Tommie Sams, DO  tolterodine (DETROL LA) 4 MG 24 hr capsule Take 1 capsule (4 mg total) by mouth daily. 04/12/23   McKenzie, Mardene Celeste, MD  venlafaxine XR (EFFEXOR-XR) 75 MG 24 hr capsule TAKE 1 CAPSULE BY MOUTH ONCE DAILY WITH BREAKFAST 04/16/23   Tommie Sams, DO    Family History Family History  Problem Relation Age of Onset   Diabetes Mother    Hypertension Mother    Congestive Heart Failure Father    Hypertension Father    Skin cancer Father 67       squamous cell on lip; metastatic   Hypertension Brother    Skin cancer Brother        squamous cell   Breast cancer Paternal Aunt        dx 3s d. 53s   Prostate cancer Paternal Uncle        dx 77s   Colon cancer Maternal Grandmother        dx 14s d. 86s   Diabetes Other    Cancer Cousin        unk type; possibly pancreatic    Social History Social History   Tobacco Use   Smoking status: Never   Smokeless tobacco: Never  Vaping Use   Vaping status: Never Used  Substance Use Topics   Alcohol use: No   Drug use: No     Allergies   Bactrim [sulfamethoxazole-trimethoprim] and Ace inhibitors   Review of Systems Review of Systems PER HPI  Physical Exam Triage Vital Signs ED Triage Vitals  Encounter Vitals Group     BP 06/20/23 1220 (!) 180/79     Systolic BP Percentile --      Diastolic BP Percentile --      Pulse Rate 06/20/23 1220 68     Resp 06/20/23 1220 16     Temp 06/20/23 1220 (!) 97.3 F (36.3  C)     Temp Source 06/20/23 1220 Oral     SpO2 06/20/23 1220 96 %     Weight --      Height --      Head Circumference --  Peak Flow --      Pain Score 06/20/23 1221 5     Pain Loc --      Pain Education --      Exclude from Growth Chart --    No data found.  Updated Vital Signs BP (!) 180/79 (BP Location: Right Arm)   Pulse 68   Temp (!) 97.3 F (36.3 C) (Oral)   Resp 16   SpO2 96%   Visual Acuity Right Eye Distance:   Left Eye Distance:   Bilateral Distance:    Right Eye Near:   Left Eye Near:    Bilateral Near:     Physical Exam Vitals and nursing note reviewed.  Constitutional:      Appearance: Normal appearance. She is not ill-appearing.  HENT:     Head: Atraumatic.     Mouth/Throat:     Mouth: Mucous membranes are moist.  Eyes:     Extraocular Movements: Extraocular movements intact.     Conjunctiva/sclera: Conjunctivae normal.  Cardiovascular:     Rate and Rhythm: Normal rate and regular rhythm.     Heart sounds: Normal heart sounds.  Pulmonary:     Effort: Pulmonary effort is normal.     Breath sounds: Normal breath sounds.  Abdominal:     General: Bowel sounds are normal. There is no distension.     Palpations: Abdomen is soft.     Tenderness: There is no abdominal tenderness. There is right CVA tenderness and left CVA tenderness. There is no guarding.  Musculoskeletal:        General: Tenderness present. Normal range of motion.     Cervical back: Normal range of motion and neck supple.     Comments: Tender to palpation the lateral mid back bilaterally  Skin:    General: Skin is warm and dry.     Findings: Rash present.     Comments: Erythematous superficial papular lesions across face, some pustular lesions scattered about  Neurological:     Mental Status: She is alert and oriented to person, place, and time.     Motor: No weakness.     Gait: Gait normal.  Psychiatric:        Mood and Affect: Mood normal.        Thought Content: Thought  content normal.        Judgment: Judgment normal.    UC Treatments / Results  Labs (all labs ordered are listed, but only abnormal results are displayed) Labs Reviewed  POCT URINALYSIS DIP (MANUAL ENTRY) - Abnormal; Notable for the following components:      Result Value   Leukocytes, UA Trace (*)    All other components within normal limits  URINE CULTURE   EKG   Radiology No results found.  Procedures Procedures (including critical care time)  Medications Ordered in UC Medications - No data to display  Initial Impression / Assessment and Plan / UC Course  I have reviewed the triage vital signs and the nursing notes.  Pertinent labs & imaging results that were available during my care of the patient were reviewed by me and considered in my medical decision making (see chart for details).     Urinalysis today with trace leuks, otherwise no evidence of urinary tract infection, kidney stone.  Culture sent given her history and concern for this and will adjust if needed.  Push fluids, over-the-counter pain relievers, heat, massage.  Will treat with Clindagel for facial rash, possibly some rosacea or irritant  reaction.  Hydrocortisone to the face as needed as well.  Good moisturization and avoidance of irritants.  Return for worsening symptoms.  Final Clinical Impressions(s) / UC Diagnoses   Final diagnoses:  Bilateral flank pain  Facial rash     Discharge Instructions      I have sent a topical antibiotic gel for the face and you may also use hydrocortisone cream over-the-counter mixed with a bit of facial moisturizer.  Follow-up with your primary care provider if either of your concerns worsen.  We have sent out a urine culture and we will let you know if this shows that we need to add any medication.  Your urinalysis does not show evidence of a urinary tract infection at this time.    ED Prescriptions     Medication Sig Dispense Auth. Provider   clindamycin  (CLINDAGEL) 1 % gel Apply topically 2 (two) times daily. 30 g Particia Nearing, [place]      PDMP not reviewed this encounter.   Particia Nearing, [place] 06/20/23 1320

## 2023-06-20 NOTE — ED Triage Notes (Signed)
Pt states bilateral back pain for the past 4 days.  States she has history of kidney infections.   Pt also has a rash on her face.  Denies any new medications or soaps.

## 2023-06-20 NOTE — Discharge Instructions (Signed)
I have sent a topical antibiotic gel for the face and you may also use hydrocortisone cream over-the-counter mixed with a bit of facial moisturizer.  Follow-up with your primary care provider if either of your concerns worsen.  We have sent out a urine culture and we will let you know if this shows that we need to add any medication.  Your urinalysis does not show evidence of a urinary tract infection at this time.

## 2023-06-21 LAB — URINE CULTURE: Culture: <10000 — AB

## 2023-06-22 ENCOUNTER — Telehealth: Payer: Self-pay | Admitting: Emergency Medicine

## 2023-06-22 NOTE — Telephone Encounter (Signed)
Pt called and inquired about medication regarding recent culture results. Consulted provider and reported does not recommend treatment at this time. Provider reported if symptoms worsen, develop urinary frequency,fever or if flank pain becomes unilateral to be re-evaluated. Pt agitated but verbalized understanding.

## 2023-06-25 ENCOUNTER — Telehealth: Payer: Self-pay

## 2023-06-25 NOTE — Telephone Encounter (Signed)
Pt has appt on Wed with Dr Adriana Simas she is in a great deal of pain and wanted to know if she needed to come in get the urine teste sooner to get something for pain?  [phone] Britta Mccreedy

## 2023-06-26 ENCOUNTER — Other Ambulatory Visit: Payer: Self-pay | Admitting: Family Medicine

## 2023-06-26 ENCOUNTER — Other Ambulatory Visit (INDEPENDENT_AMBULATORY_CARE_PROVIDER_SITE_OTHER): Payer: Self-pay

## 2023-06-26 DIAGNOSIS — N390 Urinary tract infection, site not specified: Secondary | ICD-10-CM | POA: Diagnosis not present

## 2023-06-26 LAB — POCT URINALYSIS DIP (CLINITEK)
Bilirubin, UA: NEGATIVE
Blood, UA: NEGATIVE
Glucose, UA: NEGATIVE mg/dL
Ketones, POC UA: NEGATIVE mg/dL
Nitrite, UA: NEGATIVE
Spec Grav, UA: 1.015 (ref 1.010–1.025)
Urobilinogen, UA: 0.2 U/dL
pH, UA: 6 (ref 5.0–8.0)

## 2023-06-26 MED ORDER — PHENAZOPYRIDINE HCL 200 MG PO TABS: 200.0000 mg | ORAL_TABLET | Freq: Three times a day (TID) | ORAL | 0 refills | Status: AC | PRN

## 2023-06-26 MED ORDER — CEPHALEXIN 500 MG PO CAPS: 500.0000 mg | ORAL_CAPSULE | Freq: Two times a day (BID) | ORAL | 0 refills | Status: DC

## 2023-06-26 NOTE — Telephone Encounter (Signed)
Pt has appt on Wed with Dr Adriana Simas she is in a great deal of pain and wanted to know if she needed to come in get the urine teste sooner to get something for pain?   [phone] Kimberly [name]   She is being seen for UTI tomorrow , please advise

## 2023-06-27 ENCOUNTER — Ambulatory Visit: Payer: Medicare Other | Admitting: Family Medicine

## 2023-06-29 LAB — URINE CULTURE

## 2023-06-29 LAB — SPECIMEN STATUS REPORT

## 2023-06-30 ENCOUNTER — Other Ambulatory Visit: Payer: Self-pay | Admitting: Family Medicine

## 2023-06-30 MED ORDER — CIPROFLOXACIN HCL 500 MG PO TABS: 500.0000 mg | ORAL_TABLET | Freq: Two times a day (BID) | ORAL | 0 refills | Status: DC

## 2023-07-05 ENCOUNTER — Other Ambulatory Visit: Payer: Self-pay

## 2023-07-05 DIAGNOSIS — E559 Vitamin D deficiency, unspecified: Secondary | ICD-10-CM

## 2023-07-05 DIAGNOSIS — C50411 Malignant neoplasm of upper-outer quadrant of right female breast: Secondary | ICD-10-CM

## 2023-07-06 ENCOUNTER — Telehealth: Payer: Self-pay

## 2023-07-06 ENCOUNTER — Inpatient Hospital Stay: Payer: Medicare Other | Attending: Hematology

## 2023-07-06 DIAGNOSIS — Z79811 Long term (current) use of aromatase inhibitors: Secondary | ICD-10-CM | POA: Insufficient documentation

## 2023-07-06 DIAGNOSIS — Z800 Family history of malignant neoplasm of digestive organs: Secondary | ICD-10-CM | POA: Diagnosis not present

## 2023-07-06 DIAGNOSIS — I10 Essential (primary) hypertension: Secondary | ICD-10-CM

## 2023-07-06 DIAGNOSIS — Z87440 Personal history of urinary (tract) infections: Secondary | ICD-10-CM | POA: Diagnosis not present

## 2023-07-06 DIAGNOSIS — C50411 Malignant neoplasm of upper-outer quadrant of right female breast: Secondary | ICD-10-CM | POA: Insufficient documentation

## 2023-07-06 DIAGNOSIS — D649 Anemia, unspecified: Secondary | ICD-10-CM | POA: Diagnosis not present

## 2023-07-06 DIAGNOSIS — E559 Vitamin D deficiency, unspecified: Secondary | ICD-10-CM

## 2023-07-06 DIAGNOSIS — Z170 Estrogen receptor positive status [ER+]: Secondary | ICD-10-CM | POA: Insufficient documentation

## 2023-07-06 DIAGNOSIS — Z803 Family history of malignant neoplasm of breast: Secondary | ICD-10-CM | POA: Diagnosis not present

## 2023-07-06 DIAGNOSIS — Z9011 Acquired absence of right breast and nipple: Secondary | ICD-10-CM | POA: Diagnosis not present

## 2023-07-06 DIAGNOSIS — Z8042 Family history of malignant neoplasm of prostate: Secondary | ICD-10-CM | POA: Insufficient documentation

## 2023-07-06 LAB — COMPREHENSIVE METABOLIC PANEL
ALT: 20 U/L (ref 0–44)
AST: 23 U/L (ref 15–41)
Albumin: 4.2 g/dL (ref 3.5–5.0)
Alkaline Phosphatase: 75 U/L (ref 38–126)
Anion gap: 9 (ref 5–15)
BUN: 21 mg/dL (ref 8–23)
CO2: 25 mmol/L (ref 22–32)
Calcium: 9.5 mg/dL (ref 8.9–10.3)
Chloride: 101 mmol/L (ref 98–111)
Creatinine, Ser: 0.98 mg/dL (ref 0.44–1.00)
GFR, Estimated: >60 mL/min (ref >60)
Glucose, Bld: 170 mg/dL — ABNORMAL HIGH (ref 70–99)
Potassium: 3.8 mmol/L (ref 3.5–5.1)
Sodium: 135 mmol/L (ref 135–145)
Total Bilirubin: 0.7 mg/dL (ref <1.2)
Total Protein: 7.7 g/dL (ref 6.5–8.1)

## 2023-07-06 LAB — CBC WITH DIFFERENTIAL/PLATELET
Abs Immature Granulocytes: 0.01 10*3/uL (ref 0.00–0.07)
Basophils Absolute: 0.1 10*3/uL (ref 0.0–0.1)
Basophils Relative: 1 %
Eosinophils Absolute: 0.4 10*3/uL (ref 0.0–0.5)
Eosinophils Relative: 6 %
HCT: 36.1 % (ref 36.0–46.0)
Hemoglobin: 11.8 g/dL — ABNORMAL LOW (ref 12.0–15.0)
Immature Granulocytes: 0 %
Lymphocytes Relative: 42 %
Lymphs Abs: 3.1 10*3/uL (ref 0.7–4.0)
MCH: 31.2 pg (ref 26.0–34.0)
MCHC: 32.7 g/dL (ref 30.0–36.0)
MCV: 95.5 fL (ref 80.0–100.0)
Monocytes Absolute: 0.6 10*3/uL (ref 0.1–1.0)
Monocytes Relative: 8 %
Neutro Abs: 3.1 10*3/uL (ref 1.7–7.7)
Neutrophils Relative %: 43 %
Platelets: 291 10*3/uL (ref 150–400)
RBC: 3.78 MIL/uL — ABNORMAL LOW (ref 3.87–5.11)
RDW: 12.4 % (ref 11.5–15.5)
WBC: 7.2 10*3/uL (ref 4.0–10.5)
nRBC: 0 % (ref 0.0–0.2)

## 2023-07-06 LAB — VITAMIN D 25 HYDROXY (VIT D DEFICIENCY, FRACTURES): Vit D, 25-Hydroxy: 42.29 ng/mL (ref 30–100)

## 2023-07-06 MED ORDER — METOPROLOL SUCCINATE ER 100 MG PO TB24: ORAL_TABLET | ORAL | 0 refills | Status: DC

## 2023-07-06 NOTE — Telephone Encounter (Signed)
Prescription sent to pharmacy.

## 2023-07-06 NOTE — Telephone Encounter (Signed)
Pt wanted to thank Dr Adriana Simas for calling medication in on a Saturday for her UTI she was surprised it was sent then but needed and wanted to say thank you.

## 2023-07-06 NOTE — Telephone Encounter (Signed)
Prescription Request  07/06/2023  LOV: Visit date not found  What is the name of the medication or equipment?   metoprolol succinate (TOPROL-XL) 100 MG 24 hr tablet    Have you contacted your pharmacy to request a refill? Yes   Which pharmacy would you like this sent to?   Please send to [facility] pt wants this med switch to here.    Patient notified that their request is being sent to the clinical staff for review and that they should receive a response within 2 business days.   Please advise at Mobile [phone] (mobile)

## 2023-07-13 ENCOUNTER — Inpatient Hospital Stay (HOSPITAL_BASED_OUTPATIENT_CLINIC_OR_DEPARTMENT_OTHER): Payer: Medicare Other | Admitting: Oncology

## 2023-07-13 VITALS — BP 138/77 | HR 70 | Temp 97.4°F | Resp 18 | Ht 63.0 in | Wt 181.6 lb

## 2023-07-13 DIAGNOSIS — Z170 Estrogen receptor positive status [ER+]: Secondary | ICD-10-CM | POA: Diagnosis not present

## 2023-07-13 DIAGNOSIS — C50411 Malignant neoplasm of upper-outer quadrant of right female breast: Secondary | ICD-10-CM | POA: Diagnosis not present

## 2023-07-13 DIAGNOSIS — Z87440 Personal history of urinary (tract) infections: Secondary | ICD-10-CM | POA: Diagnosis not present

## 2023-07-13 DIAGNOSIS — Z9011 Acquired absence of right breast and nipple: Secondary | ICD-10-CM | POA: Diagnosis not present

## 2023-07-13 DIAGNOSIS — Z79811 Long term (current) use of aromatase inhibitors: Secondary | ICD-10-CM

## 2023-07-13 DIAGNOSIS — Z1231 Encounter for screening mammogram for malignant neoplasm of breast: Secondary | ICD-10-CM | POA: Diagnosis not present

## 2023-07-13 DIAGNOSIS — D649 Anemia, unspecified: Secondary | ICD-10-CM | POA: Diagnosis not present

## 2023-07-13 DIAGNOSIS — L659 Nonscarring hair loss, unspecified: Secondary | ICD-10-CM | POA: Diagnosis not present

## 2023-07-13 NOTE — Progress Notes (Signed)
[facility] [address], [place] [address]    Clinic Day:  07/13/2023  Referring physician: Tommie Sams, DO  Patient Care Team: Tommie Sams, DO as PCP - General (Family Medicine) Doreatha Massed, MD as Medical Oncologist (Medical Oncology)  CHIEF COMPLAINT:   Diagnosis: right breast cancer    Cancer Staging  Breast cancer of upper-outer quadrant of right female breast [facility]) Staging form: Breast, AJCC 8th Edition - Clinical stage from 08/31/2021: Stage IIA (cT1c, cN1, cM0, G2, ER+, PR-, HER2-) - Unsigned    Prior Therapy: right breast mastectomy with lymph node biopsy on 07/29/2021   Current Therapy:  anastrozole   HISTORY OF PRESENT ILLNESS:   Oncology History  Breast cancer of upper-outer quadrant of right female breast (HCC)  08/31/2021 Initial Diagnosis   Breast cancer of upper-outer quadrant of right female breast (HCC)    Genetic Testing   Negative genetic testing. No pathogenic variants identified on the [facility] Multi-Cancer+RNA panel. VUS in [facility] called c.1022G>A identified. The report date is 10/24/2021.  The Multi-Cancer Panel + RNA offered by [facility] includes sequencing and/or deletion duplication testing of the following 84 genes: AIP, ALK, APC, ATM, AXIN2,BAP1,  BARD1, BLM, BMPR1A, BRCA1, BRCA2, BRIP1, CASR, CDC73, CDH1, CDK4, CDKN1B, CDKN1C, CDKN2A (p14ARF), CDKN2A (p16INK4a), CEBPA, CHEK2, CTNNA1, DICER1, DIS3L2, EGFR (c.2369C>T, p.Thr790Met variant only), EPCAM (Deletion/duplication testing only), FH, FLCN, GATA2, GPC3, GREM1 (Promoter region deletion/duplication testing only), HOXB13 (c.251G>A, p.Gly84Glu), HRAS, KIT, MAX, MEN1, MET, MITF (c.952G>A, p.Glu318Lys variant only), MLH1, MSH2, MSH3, MSH6, MUTYH, NBN, NF1, NF2, NTHL1, PALB2, PDGFRA, PHOX2B, PMS2, POLD1, POLE, POT1, PRKAR1A, PTCH1, PTEN, RAD50, RAD51C, RAD51D, RB1, RECQL4, RET, RUNX1, SDHAF2, SDHA (sequence changes only), SDHB, SDHC, SDHD, SMAD4, SMARCA4, SMARCB1, SMARCE1,  STK11, SUFU, TERC, TERT, TMEM127, TP53, TSC1, TSC2, VHL, WRN and WT1.      INTERVAL HISTORY:   Kimberly [name] is a 72 y.o. female presenting to clinic today for follow up of right breast cancer. She was last seen by [place], [place] on 01/04/2023.    She presents today for follow-up.  Since her last visit, she was hospitalized on 01/16/2023-01/19/2023 and in July 2024 for recurrent urinary tract infections, AKI and altered mental status.  Reports hospitalization in July her family did not think that she was going to make it.  She was treated with Bactrim orally for first UTI which they think may have caused decreased oral and can take, dehydration and hyponatremia causing the second episode.  She was eventually transferred to [facility] and ID for lumbar puncture.  Workup was unrevealing.  Has stopped taking one of her medications due to urinary retention and she is scared it may cause another UTI.  Reports it took her a little while to completely get her memory back but she feels back to normal.  Reports she lost about 10 pounds while in the hospital.  She has recently started back to water aerobics at the [facility].  Questions whether the pool had anything to do with the recurrent UTIs.  She is unhappy with current urologist and is switching to 1 in [place].  She thinks she sees them next month.  She continues to tolerate Arimidex well although she has severe mood swings.  Reports significant hair loss over the last 6 weeks to the point where she went out and bought herself a wig yesterday.  She is unsure of what is causing this and she has been on Arimidex for over 2 years.  Today, she states that  she is doing well overall.  Appetite is 60% and energy levels are 25%.  Has intermittent pain to right hip rates it a 6 out of 10.  Has a cough that is chronic and occasional.  Has numbness and tingling to hands and feet.  Denies any changes to her breast.  No new lumps or bumps.  No new palpable masses. She denies  nausea, vomiting or abdominal pain. Her bowel habits are unchanged. She denies easy bruising or bleeding. She denies fevers, chills, sweats, shortness of breath, chest pain or cough. She has no other complaints. Rest of the ROS is below.   PAST MEDICAL HISTORY:   Past Medical History: Past Medical History:  Diagnosis Date   Anxiety    Arthritis    Depression    Diabetes mellitus    Family history of breast cancer    Family history of colon cancer    Family history of prostate cancer    Family history of skin cancer    GERD (gastroesophageal reflux disease)    High blood pressure    Hypothyroid    Obesity    PONV (postoperative nausea and vomiting)     Surgical History: Past Surgical History:  Procedure Laterality Date   ABDOMINAL HYSTERECTOMY     Bunions Right    CARPAL TUNNEL RELEASE Right    KNEE ARTHROSCOPY Right    MASTECTOMY MODIFIED RADICAL Right 07/29/2021   invasive ductal ca   THYROID SURGERY     removal   TOTAL KNEE ARTHROPLASTY Right 08/25/2013   Procedure: TOTAL KNEE ARTHROPLASTY;  Surgeon: Vickki Hearing, MD;  Location: [facility];  Service: Orthopedics;  Laterality: Right;   TOTAL KNEE ARTHROPLASTY Left 09/09/2014   Procedure: LEFT TOTAL KNEE ARTHROPLASTY;  Surgeon: Vickki Hearing, MD;  Location: [facility];  Service: Orthopedics;  Laterality: Left;    Social History: Social History   Socioeconomic History   Marital status: Married    Spouse name: Not on file   Number of children: 5   Years of education: Not on file   Highest education level: Not on file  Occupational History   Occupation: CNA    Employer: [employer]  Tobacco Use   Smoking status: Never   Smokeless tobacco: Never  Vaping Use   Vaping status: Never Used  Substance and Sexual Activity   Alcohol use: No   Drug use: No   Sexual activity: Yes    Birth control/protection: Surgical  Other Topics Concern   Not on file  Social History Narrative   Not on file   Social  Determinants of Health   Financial Resource Strain: Low Risk  (02/16/2023)   Overall Financial Resource Strain (CARDIA)    Difficulty of Paying Living Expenses: Not hard at all  Food Insecurity: No Food Insecurity (03/29/2023)   Hunger Vital Sign    Worried About Running Out of Food in the Last Year: Never true    Ran Out of Food in the Last Year: Never true  Transportation Needs: No Transportation Needs (03/29/2023)   PRAPARE - Administrator, Civil Service (Medical): No    Lack of Transportation (Non-Medical): No  Physical Activity: Insufficiently Active (02/16/2023)   Exercise Vital Sign    Days of Exercise per Week: 2 days    Minutes of Exercise per Session: 30 min  Stress: No Stress Concern Present (02/16/2023)   [employer] of Occupational Health - Occupational Stress Questionnaire    Feeling of Stress :  Not at all  Social Connections: Socially Integrated (02/16/2023)   Social Connection and Isolation Panel [NHANES]    Frequency of Communication with Friends and Family: More than three times a week    Frequency of Social Gatherings with Friends and Family: More than three times a week    Attends Religious Services: More than 4 times per year    Active Member of [employer] or Organizations: Yes    Attends Engineer, structural: More than 4 times per year    Marital Status: Married  Catering manager Violence: Not At Risk (03/19/2023)   Humiliation, Afraid, Rape, and Kick questionnaire    Fear of Current or Ex-Partner: No    Emotionally Abused: No    Physically Abused: No    Sexually Abused: No    Family History: Family History  Problem Relation Age of Onset   Diabetes Mother    Hypertension Mother    Congestive Heart Failure Father    Hypertension Father    Skin cancer Father 81       squamous cell on lip; metastatic   Hypertension Brother    Skin cancer Brother        squamous cell   Breast cancer Paternal Aunt        dx 33s d. 67s   Prostate cancer  Paternal Uncle        dx 18s   Colon cancer Maternal Grandmother        dx 77s d. 48s   Diabetes Other    Cancer Cousin        unk type; possibly pancreatic    Current Medications:  Current Outpatient Medications:    anastrozole (ARIMIDEX) 1 MG tablet, TAKE 1 TABLET DAILY, Disp: 90 tablet, Rfl: 3   clindamycin (CLINDAGEL) 1 % gel, Apply topically 2 (two) times daily., Disp: 30 g, Rfl: 0   famotidine (PEPCID) 20 MG tablet, Take 1 tablet (20 mg total) by mouth at bedtime., Disp: 90 tablet, Rfl: 1   gabapentin (NEURONTIN) 100 MG capsule, Take 1 capsule (100 mg total) by mouth 3 (three) times daily., Disp: 270 capsule, Rfl: 1   glipiZIDE (GLUCOTROL XL) 5 MG 24 hr tablet, Take 1 tablet (5 mg total) by mouth daily with breakfast., Disp: 90 tablet, Rfl: 3   hydrochlorothiazide (HYDRODIURIL) 25 MG tablet, TAKE 1 TABLET DAILY, Disp: 90 tablet, Rfl: 0   insulin degludec (TRESIBA FLEXTOUCH) 100 UNIT/ML FlexTouch Pen, Inject 28 Units into the skin at bedtime., Disp: 30 mL, Rfl: 3   levothyroxine (SYNTHROID) 125 MCG tablet, Take 1 tablet (125 mcg total) by mouth daily before breakfast., Disp: 90 tablet, Rfl: 3   losartan (COZAAR) 100 MG tablet, Take 1 tablet (100 mg total) by mouth every morning., Disp: 90 tablet, Rfl: 0   Magnesium 250 MG TABS, Take 250 mg by mouth daily., Disp: , Rfl:    metFORMIN (GLUCOPHAGE-XR) 500 MG 24 hr tablet, Take 1 tablet (500 mg total) by mouth 2 (two) times daily with a meal., Disp: 180 tablet, Rfl: 3   metoprolol succinate (TOPROL-XL) 100 MG 24 hr tablet, TAKE 1 TABLET AT BEDTIME   WITH OR IMMEDIATELY        FOLLOWING A MEAL, Disp: 90 tablet, Rfl: 0   miconazole (MICOTIN) 2 % powder, Apply topically as needed for itching., Disp: 70 g, Rfl: 0   omeprazole (PRILOSEC) 20 MG capsule, Take 1 capsule (20 mg total) by mouth at bedtime., Disp: 90 capsule, Rfl: 3   ULTICARE MINI  PEN NEEDLES 31G X 6 MM MISC, daily. as directed, Disp: , Rfl:    venlafaxine XR (EFFEXOR-XR) 75 MG 24  hr capsule, TAKE 1 CAPSULE BY MOUTH ONCE DAILY WITH BREAKFAST, Disp: 90 capsule, Rfl: 3 No current facility-administered medications for this visit.  Facility-Administered Medications Ordered in Other Visits:    bupivacaine liposome (EXPAREL) 1.3 % injection 266 mg, 20 mL, Infiltration, Once, Vickki Hearing, MD   Allergies: Allergies  Allergen Reactions   Bactrim [Sulfamethoxazole-Trimethoprim] Other (See Comments)    Was told during hospital admission by providers to not ever take this again as it made her worse per patient   Ace Inhibitors Cough    REVIEW OF SYSTEMS:   Review of Systems  Constitutional:  Positive for fatigue.  Respiratory:  Positive for cough.   Endocrine:       Hair loss  Genitourinary:         Recurrent UTIs  Psychiatric/Behavioral:  Positive for depression. The patient is nervous/anxious.      VITALS:   Blood pressure 138/77, pulse 70, temperature (!) 97.4 F (36.3 C), temperature source Oral, resp. rate 18, height 5\' 3"  (1.6 m), weight 181 lb 9.6 oz (82.4 kg), SpO2 96%.  Wt Readings from Last 3 Encounters:  07/13/23 181 lb 9.6 oz (82.4 kg)  05/29/23 174 lb 6.4 oz (79.1 kg)  05/03/23 177 lb (80.3 kg)    Body mass index is 32.17 kg/m.  Performance status (ECOG): 1 - Symptomatic but completely ambulatory  PHYSICAL EXAM:   Physical Exam Vitals and nursing note reviewed. Exam conducted with a chaperone present.  Constitutional:      General: She is not in acute distress.    Appearance: Normal appearance.  Cardiovascular:     Rate and Rhythm: Normal rate and regular rhythm.     Heart sounds: Normal heart sounds.  Pulmonary:     Effort: Pulmonary effort is normal.     Breath sounds: Normal breath sounds.  Abdominal:     Palpations: There is no hepatomegaly or splenomegaly.  Musculoskeletal:     Right lower leg: No edema.     Left lower leg: No edema.  Neurological:     General: No focal deficit present.     Mental Status: She is alert  and oriented to person, place, and time.  Psychiatric:        Mood and Affect: Mood normal.        Behavior: Behavior normal.     LABS:      Latest Ref Rng & Units 07/06/2023   11:05 AM 04/25/2023   10:30 AM 04/09/2023    3:47 PM  CBC  WBC 4.0 - 10.5 K/uL 7.2  7.6  8.6   Hemoglobin 12.0 - 15.0 g/dL 91.4  78.2  95.6   Hematocrit 36.0 - 46.0 % 36.1  32.6  30.9   Platelets 150 - 400 K/uL 291  326  316       Latest Ref Rng & Units 07/06/2023   11:05 AM 04/25/2023   10:30 AM 04/09/2023    3:47 PM  CMP  Glucose 70 - 99 mg/dL 213  086  578   BUN 8 - 23 mg/dL 21  15  19    Creatinine 0.44 - 1.00 mg/dL 4.69  6.29  5.28   Sodium 135 - 145 mmol/L 135  139  139   Potassium 3.5 - 5.1 mmol/L 3.8  4.4  4.2   Chloride 98 - 111 mmol/L 101  101  99   CO2 22 - 32 mmol/L 25  23  24    Calcium 8.9 - 10.3 mg/dL 9.5  9.6  9.9   Total Protein 6.5 - 8.1 g/dL 7.7   7.6   Total Bilirubin <1.2 mg/dL 0.7   0.4   Alkaline Phos 38 - 126 U/L 75   92   AST 15 - 41 U/L 23   21   ALT 0 - 44 U/L 20   15      No results found for: "CEA1", "CEA" / No results found for: "CEA1", "CEA" No results found for: "PSA1" No results found for: "FAO130" No results found for: "CAN125"  No results found for: "TOTALPROTELP", "ALBUMINELP", "A1GS", "A2GS", "BETS", "BETA2SER", "GAMS", "MSPIKE", "SPEI" Lab Results  Component Value Date   TIBC 340 04/09/2023   TIBC 365 09/05/2021   TIBC 314 07/26/2015   FERRITIN 271 (H) 04/09/2023   FERRITIN 73 09/05/2021   FERRITIN 293 (H) 07/26/2015   IRONPCTSAT 20 04/09/2023   IRONPCTSAT 20 09/05/2021   IRONPCTSAT 29 07/26/2015   No results found for: "LDH"   STUDIES:   No results found.     ASSESSMENT & PLAN:   Assessment: 1. Stage IIa (T1CN1) right breast upper outer quadrant IDC: - Abnormal mammogram on 06/02/2021. - Right breast 9:00 mass biopsy on 06/30/2021, invasive ductal carcinoma, ER 70% positive, PR negative, Ki-67 2%, HER2 1+.  Right axillary lymph node biopsy  was positive for metastatic carcinoma. - She met with Dr. Ulice Bold for possible reconstruction but decided against it. - She underwent right breast mastectomy with lymph node biopsy on 07/29/2021. - Pathology showed 1.1 cm grade 2 IDC, associated DCIS, margins negative.  Metastatic carcinoma involving 1/9 lymph nodes.  PT1CPN1A. - Oncotype DX recurrence score 22.  Anastrozole started in February 2023.   2. Social/family history: - She lives at home with her husband.  Today she is seen with her daughter. 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  Non-smoker. - Father had squamous cell carcinoma of the lip which has metastasized.  Maternal grandmother had colon cancer.  Paternal uncle had prostate cancer and paternal aunt had breast cancer.    Plan: 1. Stage IIa (T1CN1) right breast IDC, ER positive, PR/HER2 negative: - She is tolerating anastrozole very well.  Minor hot flashes and arthralgias are stable. Continue anastrozole therapy.  - Mammogram of left breast was obtained on 12/28/2022 due to sharp breast pain.Findings showed no evidence of malignancy. Next mammogram due in one year around May 2025.  -Recommend labs in 6 months after mammogram so we can review both.  2.  Bone health (DEXA scan 09/12/2021 T score -0.5): - She is not on calcium or vitamin D supplements.   -Vitamin D from 07/06/2023 was 42.29.  Calcium normal at 9.5. -Recommend calcium and vitamin D supplements. -Recommend repeat bone density in January 2024.  Orders placed.  3.  Recurrent UTIs: -Hospitalized twice over the summer with altered mental status and positive urinalysis.  Unclear etiology. -She will be seeing a new urologist next month for second opinion.  4.  Alopecia: -We discussed hair thinning can be a side effect of an aromatase inhibitor although she has been on this for greater than a year and a half and hair thinning started approximately 6 weeks ago and has rapidly  worsened. -Suspect hair well secondary to poor nutritional intake while hospitalized and not eating or drinking for almost a week. -Please let  me know if symptoms continue to worsen and we could potentially take you off for a few weeks to see if this improves and switch to a different from an ACE inhibitor.  5.  Anemia: -Hemoglobin has been between 11 and 12 over the past year with a significant dip while hospitalized in July. -This has improved/almost normalized.  Hemoglobin from 07/06/2023 was 11.8. -Will continue to monitor.  PLAN SUMMARY: >> Bone density in January 2025. >> Mammogram in May 2025. >> RTC after mammogram for labs a few days before and office visit.      Patient expressed understanding of the plan provided.   I spent 25 minutes dedicated to the care of this patient (face-to-face and non-face-to-face) on the date of the encounter to include what is described in the assessment and plan.  Durenda Hurt, NP 07/13/2023 3:00 PM

## 2023-08-06 ENCOUNTER — Telehealth: Payer: Self-pay

## 2023-08-06 ENCOUNTER — Other Ambulatory Visit: Payer: Self-pay

## 2023-08-06 DIAGNOSIS — R300 Dysuria: Secondary | ICD-10-CM

## 2023-08-06 LAB — POCT URINALYSIS DIP (CLINITEK)
Bilirubin, UA: NEGATIVE
Blood, UA: NEGATIVE
Glucose, UA: NEGATIVE mg/dL
Ketones, POC UA: NEGATIVE mg/dL
Leukocytes, UA: NEGATIVE
Nitrite, UA: NEGATIVE
POC PROTEIN,UA: 30 — AB
Spec Grav, UA: 1.01 (ref 1.010–1.025)
Urobilinogen, UA: 0.2 U/dL
pH, UA: 7 (ref 5.0–8.0)

## 2023-08-06 NOTE — Telephone Encounter (Signed)
Informed patient of her urinalysis results being clear , no evidence of infection, pt verbalized understanding.

## 2023-08-06 NOTE — Telephone Encounter (Signed)
See other message from today

## 2023-08-06 NOTE — Telephone Encounter (Signed)
Patient come by to give a Urine sample for UTI placed in door for lab

## 2023-08-23 DIAGNOSIS — N3941 Urge incontinence: Secondary | ICD-10-CM | POA: Diagnosis not present

## 2023-08-23 DIAGNOSIS — R3915 Urgency of urination: Secondary | ICD-10-CM | POA: Diagnosis not present

## 2023-08-23 DIAGNOSIS — N3020 Other chronic cystitis without hematuria: Secondary | ICD-10-CM | POA: Diagnosis not present

## 2023-09-01 ENCOUNTER — Other Ambulatory Visit: Payer: Self-pay | Admitting: Hematology

## 2023-09-01 ENCOUNTER — Other Ambulatory Visit: Payer: Self-pay | Admitting: Nurse Practitioner

## 2023-09-01 ENCOUNTER — Other Ambulatory Visit: Payer: Self-pay | Admitting: Family Medicine

## 2023-09-01 DIAGNOSIS — I10 Essential (primary) hypertension: Secondary | ICD-10-CM

## 2023-09-05 ENCOUNTER — Other Ambulatory Visit: Payer: Self-pay | Admitting: Family Medicine

## 2023-09-12 ENCOUNTER — Other Ambulatory Visit (HOSPITAL_COMMUNITY): Payer: Medicare Other

## 2023-09-12 ENCOUNTER — Other Ambulatory Visit: Payer: Self-pay | Admitting: Family Medicine

## 2023-09-12 DIAGNOSIS — I10 Essential (primary) hypertension: Secondary | ICD-10-CM

## 2023-09-13 ENCOUNTER — Other Ambulatory Visit: Payer: Self-pay

## 2023-09-19 ENCOUNTER — Other Ambulatory Visit (HOSPITAL_COMMUNITY): Payer: Medicare Other

## 2023-09-19 DIAGNOSIS — R3915 Urgency of urination: Secondary | ICD-10-CM | POA: Diagnosis not present

## 2023-10-02 DIAGNOSIS — E039 Hypothyroidism, unspecified: Secondary | ICD-10-CM | POA: Diagnosis not present

## 2023-10-02 DIAGNOSIS — E1122 Type 2 diabetes mellitus with diabetic chronic kidney disease: Secondary | ICD-10-CM | POA: Diagnosis not present

## 2023-10-02 DIAGNOSIS — N1831 Chronic kidney disease, stage 3a: Secondary | ICD-10-CM | POA: Diagnosis not present

## 2023-10-03 LAB — COMPREHENSIVE METABOLIC PANEL
ALT: 14 [IU]/L (ref 0–32)
AST: 19 [IU]/L (ref 0–40)
Albumin: 4.4 g/dL (ref 3.8–4.8)
Alkaline Phosphatase: 100 [IU]/L (ref 44–121)
BUN/Creatinine Ratio: 27 (ref 12–28)
BUN: 26 mg/dL (ref 8–27)
Bilirubin Total: 0.3 mg/dL (ref 0.0–1.2)
CO2: 23 mmol/L (ref 20–29)
Calcium: 9.6 mg/dL (ref 8.7–10.3)
Chloride: 102 mmol/L (ref 96–106)
Creatinine, Ser: 0.98 mg/dL (ref 0.57–1.00)
Globulin, Total: 3.1 g/dL (ref 1.5–4.5)
Glucose: 133 mg/dL — ABNORMAL HIGH (ref 70–99)
Potassium: 4.3 mmol/L (ref 3.5–5.2)
Sodium: 142 mmol/L (ref 134–144)
Total Protein: 7.5 g/dL (ref 6.0–8.5)
eGFR: 62 mL/min/{1.73_m2} (ref >59)

## 2023-10-03 LAB — T4, FREE: Free T4: 1.17 ng/dL (ref 0.82–1.77)

## 2023-10-03 LAB — LIPID PANEL
Chol/HDL Ratio: 4.1 {ratio} (ref 0.0–4.4)
Cholesterol, Total: 163 mg/dL (ref 100–199)
HDL: 40 mg/dL (ref >39)
LDL Chol Calc (NIH): 87 mg/dL (ref 0–99)
Triglycerides: 213 mg/dL — ABNORMAL HIGH (ref 0–149)
VLDL Cholesterol Cal: 36 mg/dL (ref 5–40)

## 2023-10-03 LAB — TSH: TSH: 2.22 u[IU]/mL (ref 0.450–4.500)

## 2023-10-04 DIAGNOSIS — N3941 Urge incontinence: Secondary | ICD-10-CM | POA: Diagnosis not present

## 2023-10-04 DIAGNOSIS — N311 Reflex neuropathic bladder, not elsewhere classified: Secondary | ICD-10-CM | POA: Diagnosis not present

## 2023-10-08 ENCOUNTER — Ambulatory Visit (INDEPENDENT_AMBULATORY_CARE_PROVIDER_SITE_OTHER): Payer: Medicare Other | Admitting: Nurse Practitioner

## 2023-10-08 ENCOUNTER — Encounter: Payer: Self-pay | Admitting: Nurse Practitioner

## 2023-10-08 VITALS — BP 138/80 | HR 64 | Ht 63.0 in | Wt 186.6 lb

## 2023-10-08 DIAGNOSIS — Z7984 Long term (current) use of oral hypoglycemic drugs: Secondary | ICD-10-CM

## 2023-10-08 DIAGNOSIS — Z794 Long term (current) use of insulin: Secondary | ICD-10-CM

## 2023-10-08 DIAGNOSIS — E782 Mixed hyperlipidemia: Secondary | ICD-10-CM | POA: Diagnosis not present

## 2023-10-08 DIAGNOSIS — I10 Essential (primary) hypertension: Secondary | ICD-10-CM

## 2023-10-08 DIAGNOSIS — E1122 Type 2 diabetes mellitus with diabetic chronic kidney disease: Secondary | ICD-10-CM

## 2023-10-08 DIAGNOSIS — N182 Chronic kidney disease, stage 2 (mild): Secondary | ICD-10-CM | POA: Diagnosis not present

## 2023-10-08 DIAGNOSIS — E039 Hypothyroidism, unspecified: Secondary | ICD-10-CM | POA: Diagnosis not present

## 2023-10-08 LAB — POCT GLYCOSYLATED HEMOGLOBIN (HGB A1C): Hemoglobin A1C: 7.1 % — AB (ref 4.0–5.6)

## 2023-10-08 MED ORDER — TRESIBA FLEXTOUCH 100 UNIT/ML ~~LOC~~ SOPN: 34.0000 [IU] | PEN_INJECTOR | Freq: Every day | SUBCUTANEOUS | 3 refills | Status: DC

## 2023-10-08 NOTE — Progress Notes (Signed)
 Endocrinology Follow Up Note       10/08/2023, 12:20 PM   Subjective:    Patient ID: Kimberly [name], female    DOB: [birth year].  Kimberly [name] is being seen in follow up after being seen in consultation for management of currently uncontrolled symptomatic diabetes requested by  Cook, [name], DO.   Past Medical History:  Diagnosis Date   Anxiety    Arthritis    Depression    Diabetes mellitus    Family history of breast cancer    Family history of colon cancer    Family history of prostate cancer    Family history of skin cancer    GERD (gastroesophageal reflux disease)    High blood pressure    Hypothyroid    Obesity    PONV (postoperative nausea and vomiting)     Past Surgical History:  Procedure Laterality Date   ABDOMINAL HYSTERECTOMY     Bunions Right    CARPAL TUNNEL RELEASE Right    KNEE ARTHROSCOPY Right    MASTECTOMY MODIFIED RADICAL Right 07/29/2021   invasive ductal ca   THYROID  SURGERY     removal   TOTAL KNEE ARTHROPLASTY Right 08/25/2013   Procedure: TOTAL KNEE ARTHROPLASTY;  Surgeon: Darrin Emerald, MD;  Location: [facility];  Service: Orthopedics;  Laterality: Right;   TOTAL KNEE ARTHROPLASTY Left 09/09/2014   Procedure: LEFT TOTAL KNEE ARTHROPLASTY;  Surgeon: Darrin Emerald, MD;  Location: [facility];  Service: Orthopedics;  Laterality: Left;    Social History   Socioeconomic History   Marital status: Married    Spouse name: Not on file   Number of children: 5   Years of education: Not on file   Highest education level: Not on file  Occupational History   Occupation: CNA    Employer: [employer]  Tobacco Use   Smoking status: Never   Smokeless tobacco: Never  Vaping Use   Vaping status: Never Used  Substance and Sexual Activity   Alcohol use: No   Drug use: No   Sexual activity: Yes    Birth control/protection: Surgical  Other Topics Concern   Not on file   Social History Narrative   Not on file   Social Drivers of Health   Financial Resource Strain: Low Risk  (02/16/2023)   Overall Financial Resource Strain (CARDIA)    Difficulty of Paying Living Expenses: Not hard at all  Food Insecurity: No Food Insecurity (03/29/2023)   Hunger Vital Sign    Worried About Running Out of Food in the Last Year: Never true    Ran Out of Food in the Last Year: Never true  Transportation Needs: No Transportation Needs (03/29/2023)   PRAPARE - Administrator, Civil Service (Medical): No    Lack of Transportation (Non-Medical): No  Physical Activity: Insufficiently Active (02/16/2023)   Exercise Vital Sign    Days of Exercise per Week: 2 days    Minutes of Exercise per Session: 30 min  Stress: No Stress Concern Present (02/16/2023)   [employer] of Occupational Health - Occupational Stress Questionnaire    Feeling of Stress : Not at all  Social Connections:  Socially Integrated (02/16/2023)   Social Connection and Isolation Panel [NHANES]    Frequency of Communication with Friends and Family: More than three times a week    Frequency of Social Gatherings with Friends and Family: More than three times a week    Attends Religious Services: More than 4 times per year    Active Member of [employer] or Organizations: Yes    Attends Engineer, structural: More than 4 times per year    Marital Status: Married    Family History  Problem Relation Age of Onset   Diabetes Mother    Hypertension Mother    Congestive Heart Failure Father    Hypertension Father    Skin cancer Father 74       squamous cell on lip; metastatic   Hypertension Brother    Skin cancer Brother        squamous cell   Breast cancer Paternal Aunt        dx 24s d. 34s   Prostate cancer Paternal Uncle        dx 53s   Colon cancer Maternal Grandmother        dx 74s d. 37s   Diabetes Other    Cancer Cousin        unk type; possibly pancreatic    Outpatient Encounter  Medications as of 10/08/2023  Medication Sig   anastrozole  (ARIMIDEX ) 1 MG tablet TAKE 1 TABLET DAILY   clindamycin  (CLINDAGEL) 1 % gel Apply topically 2 (two) times daily.   famotidine  (PEPCID ) 20 MG tablet TAKE 1 TABLET AT BEDTIME   gabapentin  (NEURONTIN ) 100 MG capsule TAKE 1 CAPSULE BY MOUTH THREE TIMES DAILY   glipiZIDE  (GLUCOTROL  XL) 5 MG 24 hr tablet Take 1 tablet (5 mg total) by mouth daily with breakfast.   hydrochlorothiazide  (HYDRODIURIL ) 25 MG tablet TAKE 1 TABLET DAILY   levothyroxine  (SYNTHROID ) 125 MCG tablet Take 1 tablet (125 mcg total) by mouth daily before breakfast.   losartan  (COZAAR ) 100 MG tablet TAKE 1 TABLET EVERY MORNING   Magnesium  250 MG TABS Take 250 mg by mouth daily.   metFORMIN  (GLUCOPHAGE -XR) 500 MG 24 hr tablet Take 1 tablet (500 mg total) by mouth 2 (two) times daily with a meal.   metoprolol  succinate (TOPROL -XL) 100 MG 24 hr tablet TAKE 1 TABLET AT BEDTIME   WITH OR IMMEDIATELY        FOLLOWING A MEAL   miconazole  (MICOTIN) 2 % powder Apply topically as needed for itching.   omeprazole  (PRILOSEC) 20 MG capsule Take 1 capsule (20 mg total) by mouth at bedtime.   ULTICARE MINI PEN NEEDLES 31G X 6 MM MISC daily. as directed   venlafaxine  XR (EFFEXOR -XR) 75 MG 24 hr capsule TAKE 1 CAPSULE BY MOUTH ONCE DAILY WITH BREAKFAST   [DISCONTINUED] insulin  degludec (TRESIBA  FLEXTOUCH) 100 UNIT/ML FlexTouch Pen Inject 28 Units into the skin at bedtime.   insulin  degludec (TRESIBA  FLEXTOUCH) 100 UNIT/ML FlexTouch Pen Inject 34 Units into the skin at bedtime.   Facility-Administered Encounter Medications as of 10/08/2023  Medication   bupivacaine  liposome (EXPAREL ) 1.3 % injection 266 mg    ALLERGIES: Allergies  Allergen Reactions   Bactrim  [Sulfamethoxazole -Trimethoprim ] Other (See Comments)    Was told during hospital admission by providers to not ever take this again as it made her worse per patient   Ace Inhibitors Cough    VACCINATION STATUS: Immunization  History  Administered Date(s) Administered   Fluad Quad(high Dose 65+) 07/12/2020  Fluad Trivalent(High Dose 65+) 05/02/2023   Influenza,inj,Quad PF,6+ Mos 04/30/2018, 08/24/2022   Influenza-Unspecified 05/21/2016, 05/03/2017, 06/21/2019   Moderna Sars-Covid-2 Vaccination 10/07/2019, 11/05/2019, 08/02/2020   Pneumococcal Conjugate-13 03/03/2014, 05/30/2017   Pneumococcal Polysaccharide-23 05/27/2010    Diabetes She presents for her follow-up diabetic visit. She has type 2 diabetes mellitus. Onset time: Diagnosed at approx age of 89. Her disease course has been fluctuating. There are no hypoglycemic associated symptoms. Pertinent negatives for hypoglycemia include no nervousness/anxiousness or tremors. Associated symptoms include foot paresthesias. Pertinent negatives for diabetes include no fatigue, no polydipsia, no polyuria and no weight loss. There are no hypoglycemic complications. Symptoms are stable. Diabetic complications include nephropathy and peripheral neuropathy. Risk factors for coronary artery disease include diabetes mellitus, dyslipidemia, family history, obesity, post-menopausal and hypertension. Current diabetic treatment includes oral agent (dual therapy) and insulin  injections. She is compliant with treatment most of the time. Her weight is fluctuating minimally. She is following a generally healthy diet. Meal planning includes avoidance of concentrated sweets. She has had a previous visit with a dietitian. She participates in exercise three times a week. Her home blood glucose trend is increasing steadily. Her overall blood glucose range is 180-200 mg/dl. (She presents today with her CGM showing slightly above target glycemic profile overall.  Her POCT A1c today is 7.1%, increasing from last visit of 6.4%.  She admits she has not been as active lately and has also been more stressed recently as well.  Analysis of her CGM shows TIR 55%, TAR 45%, TBR 0% with a GMI of 7.7%.) An ACE  inhibitor/angiotensin II receptor blocker is being taken. She sees a podiatrist (has seen one in the distant past).Eye exam is current.  Hyperlipidemia This is a chronic problem. The current episode started more than 1 year ago. The problem is uncontrolled. Recent lipid tests were reviewed and are variable. Exacerbating diseases include chronic renal disease, diabetes, hypothyroidism and obesity. Factors aggravating her hyperlipidemia include thiazides and beta blockers. Current antihyperlipidemic treatment includes diet change and exercise. The current treatment provides mild improvement of lipids. There are no compliance problems.  Risk factors for coronary artery disease include diabetes mellitus, dyslipidemia, family history, hypertension, obesity and post-menopausal.  Hypertension This is a chronic problem. The current episode started more than 1 year ago. The problem has been resolved since onset. The problem is controlled. Pertinent negatives include no palpitations. Agents associated with hypertension include thyroid  hormones. Risk factors for coronary artery disease include diabetes mellitus, dyslipidemia, family history, obesity and post-menopausal state. Past treatments include angiotensin blockers, beta blockers and diuretics. The current treatment provides moderate improvement. There are no compliance problems.  Hypertensive end-organ damage includes kidney disease. Identifiable causes of hypertension include chronic renal disease and a thyroid  problem.  Thyroid  Problem Presents for follow-up visit. Patient reports no anxiety, cold intolerance, constipation, depressed mood, fatigue, heat intolerance, leg swelling, palpitations, tremors, weight gain or weight loss. The symptoms have been stable. Her past medical history is significant for diabetes.     Review of systems  Constitutional: + stable body weight,  current Body mass index is 33.05 kg/m. , + fatigue, no subjective hyperthermia, no  subjective hypothermia Eyes: no blurry vision, no xerophthalmia ENT: no sore throat, no nodules palpated in throat, no dysphagia/odynophagia, no hoarseness Cardiovascular: no chest pain, no shortness of breath, no palpitations, no leg swelling Respiratory: no cough, no shortness of breath Gastrointestinal: no nausea/vomiting/diarrhea Musculoskeletal: + diffuse muscle/joint aches- improving Skin: no rashes, no hyperemia Neurological: no tremors, no  numbness, no tingling, no dizziness Psychiatric: no depression, no anxiety  Objective:     BP 138/80 (BP Location: Left Arm, Patient Position: Sitting, Cuff Size: Large) Comment: Retake manuel Cuff  Pulse 64   Ht 5\' 3"  (1.6 m)   Wt 186 lb 9.6 oz (84.6 kg)   BMI 33.05 kg/m   Wt Readings from Last 3 Encounters:  10/08/23 186 lb 9.6 oz (84.6 kg)  07/13/23 181 lb 9.6 oz (82.4 kg)  05/29/23 174 lb 6.4 oz (79.1 kg)     BP Readings from Last 3 Encounters:  10/08/23 138/80  07/13/23 138/77  06/20/23 (!) 180/79       Physical Exam- Limited  Constitutional:  Body mass index is 33.05 kg/m. , not in acute distress, normal state of mind Eyes:  EOMI, no exophthalmos Musculoskeletal: no gross deformities, strength intact in all four extremities, no gross restriction of joint movements Skin:  no rashes, no hyperemia Neurological: no tremor with outstretched hands   Diabetic Foot Exam - Simple   No data filed      CMP ( most recent) CMP     Component Value Date/Time   NA 142 10/02/2023 0857   K 4.3 10/02/2023 0857   CL 102 10/02/2023 0857   CO2 23 10/02/2023 0857   GLUCOSE 133 (H) 10/02/2023 0857   GLUCOSE 170 (H) 07/06/2023 1105   BUN 26 10/02/2023 0857   CREATININE 0.98 10/02/2023 0857   CREATININE 1.06 08/11/2014 0701   CALCIUM 9.6 10/02/2023 0857   CALCIUM 10.9 (H) 12/28/2022 1452   PROT 7.5 10/02/2023 0857   ALBUMIN 4.4 10/02/2023 0857   AST 19 10/02/2023 0857   ALT 14 10/02/2023 0857   ALKPHOS 100 10/02/2023 0857    BILITOT 0.3 10/02/2023 0857   GFRNONAA >60 07/06/2023 1105   GFRAA 65 10/11/2020 1146     Diabetic Labs (most recent): Lab Results  Component Value Date   HGBA1C 7.1 (A) 10/08/2023   HGBA1C 6.4 (H) 03/19/2023   HGBA1C 6.9 (A) 12/25/2022   MICROALBUR 10 mg/L 08/09/2022   MICROALBUR 1.1 06/03/2014   MICROALBUR 3.48 (H) 07/09/2013     Lipid Panel ( most recent) Lipid Panel     Component Value Date/Time   CHOL 163 10/02/2023 0857   TRIG 213 (H) 10/02/2023 0857   HDL 40 10/02/2023 0857   CHOLHDL 4.1 10/02/2023 0857   CHOLHDL 3.7 06/03/2014 0709   VLDL 40 06/03/2014 0709   LDLCALC 87 10/02/2023 0857   LABVLDL 36 10/02/2023 0857      Lab Results  Component Value Date   TSH 2.220 10/02/2023   TSH 0.249 (L) 03/19/2023   TSH 0.490 01/16/2023   TSH 0.414 (L) 07/31/2022   TSH 0.240 (L) 01/27/2022   TSH 0.133 (L) 10/03/2021   TSH 0.653 04/11/2021   TSH 1.490 04/19/2020   TSH 2.470 07/31/2019   TSH 0.528 04/22/2018   FREET4 1.17 10/02/2023   FREET4 1.13 (H) 03/26/2023   FREET4 1.33 07/31/2022   FREET4 1.31 01/27/2022   FREET4 1.65 10/03/2021           Assessment & Plan:   1) Type 2 diabetes mellitus with diabetic nephropathy, without long-term current use of insulin  (HCC)  She presents today with her CGM showing slightly above target glycemic profile overall.  Her POCT A1c today is 7.1%, increasing from last visit of 6.4%.  She admits she has not been as active lately and has also been more stressed recently as well.  Analysis  of her CGM shows TIR 55%, TAR 45%, TBR 0% with a GMI of 7.7%.  - Kimberly [name] has currently uncontrolled symptomatic type 2 DM since 73 years of age.  -Recent labs reviewed.  - I had a long discussion with her about the progressive nature of diabetes and the pathology behind its complications. -her diabetes is complicated by mild CKD, peripheral neuropathy and she remains at a high risk for more acute and chronic complications which  include CAD, CVA, CKD, retinopathy, and neuropathy. These are all discussed in detail with her.  - Nutritional counseling repeated at each appointment due to patients tendency to fall back in to old habits.  - The patient admits there is a room for improvement in their diet and drink choices. -  Suggestion is made for the patient to avoid simple carbohydrates from their diet including Cakes, Sweet Desserts / Pastries, Ice Cream, Soda (diet and regular), Sweet Tea, Candies, Chips, Cookies, Sweet Pastries, Store Bought Juices, Alcohol in Excess of 1-2 drinks a day, Artificial Sweeteners, Coffee Creamer, and "Sugar-free" Products. This will help patient to have stable blood glucose profile and potentially avoid unintended weight gain.   - I encouraged the patient to switch to unprocessed or minimally processed complex starch and increased protein intake (animal or plant source), fruits, and vegetables.   - Patient is advised to stick to a routine mealtimes to eat 3 meals a day and avoid unnecessary snacks (to snack only to correct hypoglycemia).  - she declines referral to Penny Crumpton, RDN, CDE for diabetes education at this time.  - I have approached her with the following individualized plan to manage her diabetes and patient agrees:   -She is advised to increase her Tresiba  to 34 units SQ nightly, continue Metformin  500 mg ER po twice daily with meals and Glipizide  5 mg XL daily with breakfast for now.    -she is encouraged to continue monitoring blood glucose twice daily (using her CGM), before breakfast and before bed, and to call the clinic if she has readings less than 70 or above 200 for 3 tests in a row.   She is benefiting from CGM device.  - she will be considered for incretin therapy as appropriate next visit.  - Specific targets for  A1c; LDL, HDL, and Triglycerides were discussed with the patient.  2) Blood Pressure /Hypertension:  her blood pressure is controlled to target.    she is advised to continue her current medications including HCTZ 25 mg po daily, Losartan  100 mg p.o. daily with breakfast, and Metoprolol  100 mg po daily.  3) Lipids/Hyperlipidemia:    Review of her recent lipid panel from 10/02/23 showed controlled LDL at 87 and elevated triglycerides of 213 (improved).  She prefers to stay away from statins at this time.  She is advised to avoid fried foods, red meats, and butter.    4)  Weight/Diet:  her Body mass index is 33.05 kg/m.  -  clearly complicating her diabetes care.   she is a candidate for weight loss. I discussed with her the fact that loss of 5 - 10% of her  current body weight will have the most impact on her diabetes management.  Exercise, and detailed carbohydrates information provided  -  detailed on discharge instructions.  5) Vitamin D  Deficiency Her recent vitamin D  level was 42.29 on 07/06/23- checked by her oncologist. She is also undergoing evaluation for hypercalcemia with him as well.  6) Hypothyroidism-unspecified Her previsit TFTs  are consistent with appropriate hormone replacement.  She is advised to continue Levothyroxine  125 mcg po daily before breakfast.     - The correct intake of thyroid  hormone (Levothyroxine , Synthroid ), is on empty stomach first thing in the morning, with water, separated by at least 30 minutes from breakfast and other medications,  and separated by more than 4 hours from calcium, iron, multivitamins, acid reflux medications (PPIs).  - This medication is a life-long medication and will be needed to correct thyroid  hormone imbalances for the rest of your life.  The dose may change from time to time, based on thyroid  blood work.  - It is extremely important to be consistent taking this medication, near the same time each morning.  -AVOID TAKING PRODUCTS CONTAINING BIOTIN (commonly found in Hair, Skin, Nails vitamins) AS IT INTERFERES WITH THE VALIDITY OF THYROID  FUNCTION BLOOD TESTS.  6) Chronic  Care/Health Maintenance: -she is on ACEI/ARB medications and is encouraged to initiate and continue to follow up with Ophthalmology, Dentist, Podiatrist at least yearly or according to recommendations, and advised to stay away from smoking. I have recommended yearly flu vaccine and pneumonia vaccine at least every 5 years; moderate intensity exercise for up to 150 minutes weekly; and sleep for at least 7 hours a day.  - she is advised to maintain close follow up with Cook, [name], DO for primary care needs, as well as her other providers for optimal and coordinated care.     I spent  44  minutes in the care of the patient today including review of labs from CMP, Lipids, Thyroid  Function, Hematology (current and previous including abstractions from other facilities); face-to-face time discussing  her blood glucose readings/logs, discussing hypoglycemia and hyperglycemia episodes and symptoms, medications doses, her options of short and long term treatment based on the latest standards of care / guidelines;  discussion about incorporating lifestyle medicine;  and documenting the encounter. Risk reduction counseling performed per USPSTF guidelines to reduce obesity and cardiovascular risk factors.     Please refer to Patient Instructions for Blood Glucose Monitoring and Insulin /Medications Dosing Guide"  in media tab for additional information. Please  also refer to " Patient Self Inventory" in the Media  tab for reviewed elements of pertinent patient history.  Kimberly [name] participated in the discussions, expressed understanding, and voiced agreement with the above plans.  All questions were answered to her satisfaction. she is encouraged to contact clinic should she have any questions or concerns prior to her return visit.     Follow up plan: - Return in about 4 months (around 02/05/2024) for Diabetes F/U with A1c in office, No previsit labs, Bring meter and logs.   Hulon Magic,  [facility] [address] [place], [place] [address] Phone: [phone] Fax: [phone]  10/08/2023, 12:20 PM

## 2023-10-31 ENCOUNTER — Encounter: Payer: Self-pay | Admitting: Family Medicine

## 2023-10-31 ENCOUNTER — Ambulatory Visit: Payer: Medicare Other | Admitting: Family Medicine

## 2023-10-31 VITALS — BP 170/74 | HR 58 | Temp 97.3°F | Ht 63.0 in | Wt 187.0 lb

## 2023-10-31 DIAGNOSIS — R8281 Pyuria: Secondary | ICD-10-CM

## 2023-10-31 DIAGNOSIS — Z23 Encounter for immunization: Secondary | ICD-10-CM

## 2023-10-31 DIAGNOSIS — M79676 Pain in unspecified toe(s): Secondary | ICD-10-CM | POA: Insufficient documentation

## 2023-10-31 DIAGNOSIS — E1122 Type 2 diabetes mellitus with diabetic chronic kidney disease: Secondary | ICD-10-CM

## 2023-10-31 DIAGNOSIS — N1831 Chronic kidney disease, stage 3a: Secondary | ICD-10-CM | POA: Diagnosis not present

## 2023-10-31 DIAGNOSIS — I10 Essential (primary) hypertension: Secondary | ICD-10-CM

## 2023-10-31 DIAGNOSIS — M79675 Pain in left toe(s): Secondary | ICD-10-CM | POA: Diagnosis not present

## 2023-10-31 DIAGNOSIS — R300 Dysuria: Secondary | ICD-10-CM | POA: Diagnosis not present

## 2023-10-31 LAB — POCT URINALYSIS DIP (CLINITEK)
Bilirubin, UA: NEGATIVE
Blood, UA: NEGATIVE
Glucose, UA: NEGATIVE mg/dL
Ketones, POC UA: NEGATIVE mg/dL
Nitrite, UA: NEGATIVE
POC PROTEIN,UA: 30 — AB
Spec Grav, UA: 1.01 (ref 1.010–1.025)
Urobilinogen, UA: 0.2 U/dL
pH, UA: 7.5 (ref 5.0–8.0)

## 2023-10-31 MED ORDER — HYDROCHLOROTHIAZIDE 25 MG PO TABS: 25.0000 mg | ORAL_TABLET | Freq: Every day | ORAL | 0 refills | Status: DC

## 2023-10-31 NOTE — Assessment & Plan Note (Signed)
 Fairly well-controlled.  Continue current medications.  Urine microalbumin today.

## 2023-10-31 NOTE — Assessment & Plan Note (Signed)
 BP elevated here today and on repeat.  Advise close monitoring of her blood pressure at home.  Continue current pharmacotherapy.

## 2023-10-31 NOTE — Progress Notes (Signed)
 Subjective:  Patient ID: Kimberly [name], female    DOB: 1951-07-01  Age: 73 y.o. MRN: [id number]  CC:   Chief Complaint  Patient presents with   Hypertension   left great toe pain     HPI:  73 year old female presents for follow-up.  Patient reports that she is having some pain in her left great toe.  Worse at night.  Throbbing and burning.  Concern for neuropathy.  She is already on gabapentin.  Will discuss today.  Patient's blood pressure significantly elevated here today.  This is unusual for her.  She is compliant with losartan, HCTZ, and metoprolol.  Patient follows with endocrinology regarding her type 2 diabetes.  Most recent A1c 7.1.  Patient is on metformin, Tresiba, glipizide.  Needs microalbumin today.  In regards to her preventative health care, patient is in need of pneumococcal vaccine.  Will discuss today.  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) 09/05/2021   Vitamin D deficiency 09/05/2021   Breast cancer of upper-outer quadrant of right female breast (HCC) 08/31/2021   S/P mastectomy, right 07/29/2021   Urinary incontinence 05/07/2020   Depression 12/29/2015   S/P knee replacement 09/02/2013   Essential hypertension, benign 07/06/2013   Hypothyroidism 07/06/2013    Social Hx   Social History   Socioeconomic History   Marital status: Married    Spouse name: Not on file   Number of children: 5   Years of education: Not on file   Highest education level: Not on file  Occupational History   Occupation: CNA    Employer: [employer]  Tobacco Use   Smoking status: Never   Smokeless tobacco: Never  Vaping Use   Vaping status: Never Used  Substance and Sexual Activity   Alcohol use: No    Drug use: No   Sexual activity: Yes    Birth control/protection: Surgical  Other Topics Concern   Not on file  Social History Narrative   Not on file   Social Drivers of Health   Financial Resource Strain: Low Risk  (02/16/2023)   Overall Financial Resource Strain (CARDIA)    Difficulty of Paying Living Expenses: Not hard at all  Food Insecurity: No Food Insecurity (03/29/2023)   Hunger Vital Sign    Worried About Running Out of Food in the Last Year: Never true    Ran Out of Food in the Last Year: Never true  Transportation Needs: No Transportation Needs (03/29/2023)   PRAPARE - Administrator, Civil Service (Medical): No    Lack of Transportation (Non-Medical): No  Physical Activity: Insufficiently Active (02/16/2023)   Exercise Vital Sign    Days of Exercise per Week: 2 days    Minutes of Exercise per Session: 30 min  Stress: No Stress Concern Present (02/16/2023)   [employer] of Occupational Health - Occupational Stress Questionnaire    Feeling of Stress : Not at all  Social Connections: Socially Integrated (02/16/2023)   Social Connection and Isolation Panel [NHANES]    Frequency of Communication with Friends and Family: More than three times a week    Frequency of Social Gatherings with Friends and Family: More than three times a week  Attends Religious Services: More than 4 times per year    Active Member of Clubs or Organizations: Yes    Attends Engineer, structural: More than 4 times per year    Marital Status: Married    Review of Systems Per HPI  Objective:  BP (!) 170/74   Pulse (!) 58   Temp (!) 97.3 F (36.3 C)   Ht 5\' 3"  (1.6 m)   Wt 187 lb (84.8 kg)   SpO2 98%   BMI 33.13 kg/m      10/31/2023   10:13 AM 10/31/2023    9:29 AM 10/08/2023   10:44 AM  BP/Weight  Systolic BP 170 178 138  Diastolic BP 74 81 80  Wt. (Lbs)  187   BMI  33.13 kg/m2     Physical Exam Constitutional:      Appearance: She is obese.  HENT:      Head: Normocephalic and atraumatic.  Cardiovascular:     Rate and Rhythm: Normal rate and regular rhythm.  Pulmonary:     Effort: Pulmonary effort is normal.     Breath sounds: Normal breath sounds. No wheezing or rales.  Neurological:     Mental Status: She is alert.  Psychiatric:     Comments: Flat affect.     Lab Results  Component Value Date   WBC 7.2 07/06/2023   HGB 11.8 (L) 07/06/2023   HCT 36.1 07/06/2023   PLT 291 07/06/2023   GLUCOSE 133 (H) 10/02/2023   CHOL 163 10/02/2023   TRIG 213 (H) 10/02/2023   HDL 40 10/02/2023   LDLCALC 87 10/02/2023   ALT 14 10/02/2023   AST 19 10/02/2023   NA 142 10/02/2023   K 4.3 10/02/2023   CL 102 10/02/2023   CREATININE 0.98 10/02/2023   BUN 26 10/02/2023   CO2 23 10/02/2023   TSH 2.220 10/02/2023   INR 1.2 03/19/2023   HGBA1C 7.1 (A) 10/08/2023   MICROALBUR 10 mg/L 08/09/2022     Assessment & Plan:  Type 2 diabetes mellitus with stage 3a chronic kidney disease, without long-term current use of insulin (HCC) Assessment & Plan: Fairly well-controlled.  Continue current medications.  Urine microalbumin today.  Orders: -     Microalbumin / creatinine urine ratio -     POCT URINALYSIS DIP (CLINITEK)  Immunization due -     Pneumococcal conjugate vaccine 20-valent  Pyuria -     Urine Culture  Essential hypertension, benign Assessment & Plan: BP elevated here today and on repeat.  Advise close monitoring of her blood pressure at home.  Continue current pharmacotherapy.  Orders: -     hydroCHLOROthiazide; Take 1 tablet (25 mg total) by mouth daily.  Dispense: 90 tablet; Refill: 0  Pain of left great toe Assessment & Plan: Likely neuropathy.     Follow-up:  3 months  Iqra Rotundo Adriana Simas DO [facility]

## 2023-10-31 NOTE — Patient Instructions (Addendum)
 Monitor BP at home.  Follow up in 3 months.  Take care  Dr. Adriana Simas

## 2023-10-31 NOTE — Assessment & Plan Note (Signed)
 Likely neuropathy.

## 2023-11-01 LAB — MICROALBUMIN / CREATININE URINE RATIO
Creatinine, Urine: 72.8 mg/dL
Microalb/Creat Ratio: 155 mg/g{creat} — ABNORMAL HIGH (ref 0–29)
Microalbumin, Urine: 113.1 ug/mL

## 2023-11-01 LAB — SPECIMEN STATUS REPORT

## 2023-11-03 LAB — URINE CULTURE

## 2023-11-03 LAB — SPECIMEN STATUS REPORT

## 2023-11-05 ENCOUNTER — Ambulatory Visit: Payer: Self-pay | Admitting: Family Medicine

## 2023-11-05 ENCOUNTER — Other Ambulatory Visit: Payer: Self-pay | Admitting: Family Medicine

## 2023-11-05 MED ORDER — CIPROFLOXACIN HCL 500 MG PO TABS: 500.0000 mg | ORAL_TABLET | Freq: Two times a day (BID) | ORAL | 0 refills | Status: DC

## 2023-11-05 NOTE — Telephone Encounter (Signed)
 Patient notified

## 2023-11-05 NOTE — Telephone Encounter (Signed)
 Tommie Sams, DO     Sending in antibiotic.

## 2023-11-05 NOTE — Telephone Encounter (Signed)
 Copied from CRM [id number]. Topic: Clinical - Lab/Test Results >> Nov 05, 2023 10:34 AM Izetta Dakin wrote: Reason for CRM: Patient's spouse called in requesting medication for positive UTI lab results and to speak with someone regarding lab results. Call back # [phone]   Chief Complaint: Urinalysis Results Test Follow Up Symptoms: Back Pain, Pain in the Side, Concentrated Urine Frequency: Acute Pertinent Negatives: Patient denies fever Disposition: [] ED /[] Urgent Care (no appt availability in office) / [] Appointment(In office/virtual)/ []  [facility]/ [] Home Care/ [] Refused Recommended Disposition /[] [place] [facility]/ [x]  Follow-up with PCP Additional Notes: BG is being triaged for treatment options after a positive Urine Culture and Analysis.   Patient stated Ciprofloxacin worked last time.    Reason for Disposition  [1] POSITIVE urine test AND [2] side (flank) or lower back pain present  Answer Assessment - Initial Assessment Questions 1. TEST: "What kind of urine test was performed?" (e.g., urinalysis, urine dipstick)      UA and Urine Culture  2. URINALYSIS RESULT: "Was it positive or negative?"  If positive, document what was positive (e.g., LE, WBC, RBC, bacteria, epithelial cells)     Positive for Bacteria and Growth  3. FEVER: "Do you have a fever?" If Yes, ask: "What is your temperature, how was it measured, and when did it start?"     No  4. FLANK PAIN: "Do you have any pain in your side?"     Back pain and pain in the side  5. OTHER SYMPTOMS: "Do you have any other symptoms?" (e.g., blood in urine, vomiting)     Forgetful,  Concentrated Odor  6. PREGNANCY: "Is there any chance you are pregnant?" "When was your last menstrual period?"     No and No  7. PHENAZOPYRIDINE (Uristat, Pyridium): "Have you taken phenazopyridine (turns your urine orange) recently?"     No  Protocols used: Urinalysis Results Follow-up Call-[facility]

## 2023-11-25 ENCOUNTER — Ambulatory Visit
Admission: EM | Admit: 2023-11-25 | Discharge: 2023-11-25 | Disposition: A | Discharge status: Home / Self Care | Attending: Nurse Practitioner | Admitting: Nurse Practitioner

## 2023-11-25 DIAGNOSIS — R399 Unspecified symptoms and signs involving the genitourinary system: Secondary | ICD-10-CM | POA: Diagnosis not present

## 2023-11-25 LAB — POCT URINALYSIS DIP (MANUAL ENTRY)
Bilirubin, UA: NEGATIVE
Blood, UA: NEGATIVE
Glucose, UA: NEGATIVE mg/dL
Ketones, POC UA: NEGATIVE mg/dL
Nitrite, UA: NEGATIVE
Protein Ur, POC: 30 mg/dL — AB
Spec Grav, UA: 1.02 (ref 1.010–1.025)
Urobilinogen, UA: 0.2 U/dL
pH, UA: 6 (ref 5.0–8.0)

## 2023-11-25 MED ORDER — CEPHALEXIN 500 MG PO CAPS: 500.0000 mg | ORAL_CAPSULE | Freq: Four times a day (QID) | ORAL | 0 refills | Status: AC

## 2023-11-25 NOTE — Discharge Instructions (Signed)
 A urine culture has been ordered.  If the result of the culture is negative, you will be contacted and advised to stop the antibiotic.  If the culture result is positive and the medication needs to be changed, you will be contacted. May take over-the-counter Tylenol as needed for pain, fever, or general discomfort. Increase fluid intake.  Try to drink at least 8-10 eight ounce glasses of water daily while symptoms persist. Develop a toileting schedule that will allow you to urinate at least every 2 hours. Avoid caffeine such as tea, soda, or coffee while symptoms persist. If the culture result is negative, and you are continue to experience symptoms, please follow-up with your PCP for further evaluation.  If you continue to experience recurrent UTIs, recommend speaking with your PCP regarding referral to urology. Follow-up as needed.

## 2023-11-25 NOTE — ED Triage Notes (Signed)
 Per husband pt has been experiencing UTI sx's, urinary frequency and lower back pain, also reports cognitive impairment with increased confusion. Pt has a hx of chronic UTI's w/ fever induced delirium.

## 2023-11-25 NOTE — ED Provider Notes (Signed)
 [facility]    CSN: [id number] Arrival date & time: 11/25/23  0818      History   Chief Complaint No chief complaint on file.   HPI Kimberly [name] is a 73 y.o. female.   The history is provided by the spouse and the patient.   Patient presents for complaints of urinary frequency, low back pain, and increased confusion.  Patient also states that she had a fever over the past week.  Symptoms started approximately 2 weeks ago per the patient and spouse.  Denies chills, abdominal pain, nausea, vomiting, dysuria, hematuria, decreased urine stream, or vaginal symptoms.  Patient and spouse endorse history of recurrent UTIs.  Family also provided information that patient had UTI with fever induced delirium in the past.  Patient states her last UTI was approximately 2 to 3 weeks ago.  She has not seen urology in the past.  Past Medical History:  Diagnosis Date   Anxiety    Arthritis    Depression    Diabetes mellitus    Family history of breast cancer    Family history of colon cancer    Family history of prostate cancer    Family history of skin cancer    GERD (gastroesophageal reflux disease)    High blood pressure    Hypothyroid    Obesity    PONV (postoperative nausea and vomiting)     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) 09/05/2021   Vitamin D deficiency 09/05/2021   Breast cancer of upper-outer quadrant of right female breast (HCC) 08/31/2021   S/P mastectomy, right 07/29/2021   Urinary incontinence 05/07/2020   Depression 12/29/2015   S/P knee replacement 09/02/2013   Essential hypertension, benign 07/06/2013   Hypothyroidism 07/06/2013    Past Surgical History:  Procedure Laterality  Date   ABDOMINAL HYSTERECTOMY     Bun[place] Right    CARPAL TUNNEL RELEASE Right    KNEE ARTHROSCOPY Right    MASTECTOMY MODIFIED RADICAL Right 07/29/2021   invasive ductal ca   THYROID SURGERY     removal   TOTAL KNEE ARTHROPLASTY Right 08/25/2013   Procedure: TOTAL KNEE ARTHROPLASTY;  Surgeon: Vickki Hearing, MD;  Location: [facility];  Service: Orthopedics;  Laterality: Right;   TOTAL KNEE ARTHROPLASTY Left 09/09/2014   Procedure: LEFT TOTAL KNEE ARTHROPLASTY;  Surgeon: Vickki Hearing, MD;  Location: [facility];  Service: Orthopedics;  Laterality: Left;    OB History   No obstetric history on file.      Home Medications    Prior to Admission medications   Medication Sig Start Date End Date Taking? Authorizing Provider  anastrozole (ARIMIDEX) 1 MG tablet TAKE 1 TABLET DAILY 09/03/23   Doreatha Massed, MD  ciprofloxacin (CIPRO) 500 MG tablet Take 1 tablet (500 mg total) by mouth 2 (two) times daily. 11/05/23   Tommie Sams, DO  clindamycin (CLINDAGEL) 1 % gel Apply topically 2 (two) times daily. 06/20/23   Particia Nearing, PA-C  famotidine (PEPCID) 20 MG tablet TAKE 1 TABLET AT BEDTIME 09/04/23   Dani Gobble, NP  gabapentin (NEURONTIN) 100 MG capsule TAKE 1 CAPSULE BY MOUTH THREE TIMES DAILY 09/05/23   Tommie Sams, DO  glipiZIDE (GLUCOTROL XL) 5 MG 24 hr tablet Take 1 tablet (5 mg total) by mouth daily with breakfast. 05/29/23   Dani Gobble, NP  hydrochlorothiazide (HYDRODIURIL) 25 MG tablet Take 1 tablet (25 mg total) by mouth daily. 10/31/23   Tommie Sams, DO  insulin degludec (TRESIBA FLEXTOUCH) 100 UNIT/ML FlexTouch Pen Inject 34 Units into the skin at bedtime. 10/08/23   Dani Gobble, NP  levothyroxine (SYNTHROID) 125 MCG tablet Take 1 tablet (125 mcg total) by mouth daily before breakfast. 05/29/23   Dani Gobble, NP  losartan (COZAAR) 100 MG tablet TAKE 1 TABLET EVERY MORNING 09/03/23   Tommie Sams, DO  Magnesium 250 MG TABS Take 250 mg by mouth  daily.    [provider]  metFORMIN (GLUCOPHAGE-XR) 500 MG 24 hr tablet Take 1 tablet (500 mg total) by mouth 2 (two) times daily with a meal. 05/29/23   Dani Gobble, NP  metoprolol succinate (TOPROL-XL) 100 MG 24 hr tablet TAKE 1 TABLET AT BEDTIME   WITH OR IMMEDIATELY        FOLLOWING A MEAL 09/03/23   Cook, [name], DO  miconazole (MICOTIN) 2 % powder Apply topically as needed for itching. 01/29/23   Tommie Sams, DO  omeprazole (PRILOSEC) 20 MG capsule Take 1 capsule (20 mg total) by mouth at bedtime. 01/29/23   Tommie Sams, DO  ULTICARE MINI PEN NEEDLES 31G X 6 MM MISC daily. as directed 05/21/23   [provider]  venlafaxine XR (EFFEXOR-XR) 75 MG 24 hr capsule TAKE 1 CAPSULE BY MOUTH ONCE DAILY WITH BREAKFAST 04/16/23   Tommie Sams, DO    Family History Family History  Problem Relation Age of Onset   Diabetes Mother    Hypertension Mother    Congestive Heart Failure Father    Hypertension Father    Skin cancer Father 93       squamous cell on lip; metastatic   Hypertension Brother    Skin cancer Brother        squamous cell   Breast cancer Paternal Aunt        dx 86s d. 42s   Prostate cancer Paternal Uncle        dx 72s   Colon cancer Maternal Grandmother        dx 90s d. 48s   Diabetes Other    Cancer Cousin        unk type; possibly pancreatic    Social History Social History   Tobacco Use   Smoking status: Never   Smokeless tobacco: Never  Vaping Use   Vaping status: Never Used  Substance Use Topics   Alcohol use: No   Drug use: No     Allergies   Bactrim [sulfamethoxazole-trimethoprim] and Ace inhibitors   Review of Systems Review of Systems Per HPI  Physical Exam Triage Vital Signs ED Triage Vitals  Encounter Vitals Group     BP 11/25/23 0841 (!) 188/77     Systolic BP Percentile --      Diastolic BP Percentile --      Pulse Rate 11/25/23 0841 (!) 58     Resp 11/25/23 0841 20     Temp 11/25/23 0841 97.8 F (36.6 C)      Temp Source 11/25/23 0841 Oral     SpO2 11/25/23 0841 96 %     Weight --      Height --      Head Circumference --  Peak Flow --      Pain Score 11/25/23 0842 0     Pain Loc --      Pain Education --      Exclude from Growth Chart --    No data found.  Updated Vital Signs BP (!) 188/77 (BP Location: Right Arm)   Pulse (!) 58   Temp 97.8 F (36.6 C) (Oral)   Resp 20   SpO2 96%   Visual Acuity Right Eye Distance:   Left Eye Distance:   Bilateral Distance:    Right Eye Near:   Left Eye Near:    Bilateral Near:     Physical Exam Vitals and nursing note reviewed.  Constitutional:      General: She is not in acute distress.    Appearance: Normal appearance.  HENT:     Head: Normocephalic.  Eyes:     Extraocular Movements: Extraocular movements intact.     Pupils: Pupils are equal, round, and reactive to light.  Cardiovascular:     Rate and Rhythm: Regular rhythm. Bradycardia present.     Pulses: Normal pulses.  Pulmonary:     Effort: Pulmonary effort is normal. No respiratory distress.     Breath sounds: Normal breath sounds. No stridor. No wheezing, rhonchi or rales.  Abdominal:     General: Bowel sounds are normal.     Palpations: Abdomen is soft.     Tenderness: There is no abdominal tenderness. There is right CVA tenderness.  Musculoskeletal:     Cervical back: Normal range of motion.  Skin:    General: Skin is warm and dry.  Neurological:     General: No focal deficit present.     Mental Status: She is alert and oriented to person, place, and time.  Psychiatric:        Mood and Affect: Mood normal.        Behavior: Behavior normal.      UC Treatments / Results  Labs (all labs ordered are listed, but only abnormal results are displayed) Labs Reviewed  POCT URINALYSIS DIP (MANUAL ENTRY) - Abnormal; Notable for the following components:      Result Value   Clarity, UA hazy (*)    Protein Ur, POC =30 (*)    Leukocytes, UA Small (1+) (*)    All  other components within normal limits    EKG   Radiology No results found.  Procedures Procedures (including critical care time)  Medications Ordered in UC Medications - No data to display  Initial Impression / Assessment and Plan / UC Course  I have reviewed the triage vital signs and the nursing notes.  Pertinent labs & imaging results that were available during my care of the patient were reviewed by me and considered in my medical decision making (see chart for details).  Urinalysis is positive for small leukocytes and protein.  On exam, patient with right CVA tenderness.  Given patient's current symptoms, will treat empirically with Keflex 500 mg 4 times daily for the next 7 days while urine culture is pending.  Supportive care recommendations were provided and discussed with patient and spouse to include fluids, over-the-counter Tylenol for pain, avoiding caffeine, and developing a toileting schedule.  Patient and spouse were advised if the culture result is negative and she is continue to experience symptoms, recommend following up with PCP for further evaluation.  Also recommended discussion with PCP to determine if referral to urology is warranted.  Patient and spouse were in  agreement with this plan of care and verbalized understanding.  All questions were answered.  Patient stable for discharge.  Final Clinical Impressions(s) / UC Diagnoses   Final diagnoses:  None   Discharge Instructions   None    ED Prescriptions   None    PDMP not reviewed this encounter.   Abran Cantor, NP 11/25/23 [phone]

## 2023-11-26 LAB — URINE CULTURE

## 2023-11-27 ENCOUNTER — Ambulatory Visit (HOSPITAL_COMMUNITY)
Admission: RE | Admit: 2023-11-27 | Discharge: 2023-11-27 | Disposition: A | Discharge status: Home / Self Care | Source: Ambulatory Visit | Attending: Family Medicine | Admitting: Family Medicine

## 2023-11-27 ENCOUNTER — Ambulatory Visit (INDEPENDENT_AMBULATORY_CARE_PROVIDER_SITE_OTHER): Admitting: Family Medicine

## 2023-11-27 ENCOUNTER — Encounter: Payer: Self-pay | Admitting: Family Medicine

## 2023-11-27 DIAGNOSIS — R4182 Altered mental status, unspecified: Secondary | ICD-10-CM | POA: Diagnosis not present

## 2023-11-27 DIAGNOSIS — R1032 Left lower quadrant pain: Secondary | ICD-10-CM | POA: Insufficient documentation

## 2023-11-27 DIAGNOSIS — D649 Anemia, unspecified: Secondary | ICD-10-CM | POA: Diagnosis not present

## 2023-11-27 DIAGNOSIS — R410 Disorientation, unspecified: Secondary | ICD-10-CM

## 2023-11-27 DIAGNOSIS — I6782 Cerebral ischemia: Secondary | ICD-10-CM | POA: Diagnosis not present

## 2023-11-27 DIAGNOSIS — R9089 Other abnormal findings on diagnostic imaging of central nervous system: Secondary | ICD-10-CM | POA: Diagnosis not present

## 2023-11-27 DIAGNOSIS — R399 Unspecified symptoms and signs involving the genitourinary system: Secondary | ICD-10-CM

## 2023-11-27 LAB — POCT URINALYSIS DIP (CLINITEK)
Bilirubin, UA: NEGATIVE
Glucose, UA: NEGATIVE mg/dL
Ketones, POC UA: NEGATIVE mg/dL
Nitrite, UA: NEGATIVE
Spec Grav, UA: 1.02 (ref 1.010–1.025)
Urobilinogen, UA: 0.2 U/dL
pH, UA: 6 (ref 5.0–8.0)

## 2023-11-27 NOTE — Assessment & Plan Note (Signed)
 Sending urine culture today.  Mildly tender on exam.  Labs for further evaluation.

## 2023-11-27 NOTE — Brief Op Note (Signed)
 Pt took her remote for her Dexcom into the scanner for a brain MRI.  The actual Dexcom was removed. This was discovered at the end of scan, while helping pt to a sitting position the remote made an alarming sound.  Pt advised to contact the manufacture company to verify the remote is safe to use.  She stated she will and that she has an extra remote.  Elmarie [facility] [phone]

## 2023-11-27 NOTE — Progress Notes (Signed)
 Subjective:  Patient ID: Kimberly [name], female    DOB: [birth year]  Age: 73 y.o. MRN: [id number]  CC:   Chief Complaint  Patient presents with   Back Pain    X's 1 week Recent uti, still have symptoms   Abdominal Pain    X's 1 week . Recent uti, still having symptoms.    Altered Mental Status    X's 2 weeks     HPI:  73 year old female presents for evaluation of the above.  Patient reports that she has had ongoing symptoms for the past 2 weeks.  She reports that she has had urinary frequency, low back pain, and incontinence.  Patient as well as her husband had noted that she seems to be more confused.  Patient states that she often forgets where she is going when they are driving and has difficulty getting her words out.  She has fallen twice recently as well.  No significant injuries from falls.  She was seen in urgent care on 3/30.  She was treated empirically for UTI with Keflex.  Patient has not noticed any significant improvement.  Urine culture returned with multiple species present.  Both patient and her husband are very concerned.  She is simply not feeling well.  Patient Active Problem List   Diagnosis Date Noted   Altered mental status 11/27/2023   LLQ abdominal pain 11/27/2023   Pain of great toe 10/31/2023   Hepatic steatosis 04/10/2023   Recurrent UTI 04/10/2023   Anemia 04/10/2023   Class 1 obesity 03/19/2023   Mixed hyperlipidemia 08/24/2022   Genetic testing 10/25/2021   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) 09/05/2021   Vitamin D deficiency 09/05/2021   Breast cancer of upper-outer quadrant of right female breast (HCC) 08/31/2021   S/P mastectomy, right 07/29/2021   Urinary incontinence 05/07/2020   Depression 12/29/2015   S/P knee replacement 09/02/2013   Essential hypertension, benign 07/06/2013   Hypothyroidism 07/06/2013    Social Hx   Social History   Socioeconomic History   Marital status: Married    Spouse name: Not on file    Number of children: 5   Years of education: Not on file   Highest education level: Not on file  Occupational History   Occupation: CNA    Employer: [employer]  Tobacco Use   Smoking status: Never   Smokeless tobacco: Never  Vaping Use   Vaping status: Never Used  Substance and Sexual Activity   Alcohol use: No   Drug use: No   Sexual activity: Yes    Birth control/protection: Surgical  Other Topics Concern   Not on file  Social History Narrative   Not on file   Social Drivers of Health   Financial Resource Strain: Low Risk  (02/16/2023)   Overall Financial Resource Strain (CARDIA)    Difficulty of Paying Living Expenses: Not hard at all  Food Insecurity: No Food Insecurity (03/29/2023)   Hunger Vital Sign    Worried About Running Out of Food in the Last Year: Never true    Ran Out of Food in the Last Year: Never true  Transportation Needs: No Transportation Needs (03/29/2023)   PRAPARE - Administrator, Civil Service (Medical): No    Lack of Transportation (Non-Medical): No  Physical Activity: Insufficiently Active (02/16/2023)   Exercise Vital Sign    Days of Exercise per Week: 2 days    Minutes of Exercise per Session: 30 min  Stress: No  Stress Concern Present (02/16/2023)   [employer] of Occupational Health - Occupational Stress Questionnaire    Feeling of Stress : Not at all  Social Connections: Socially Integrated (02/16/2023)   Social Connection and Isolation Panel [NHANES]    Frequency of Communication with Friends and Family: More than three times a week    Frequency of Social Gatherings with Friends and Family: More than three times a week    Attends Religious Services: More than 4 times per year    Active Member of [employer] or Organizations: Yes    Attends Engineer, structural: More than 4 times per year    Marital Status: Married    Review of Systems Per HPI  Objective:  BP (!) 154/93   Pulse (!) 53   Temp 97.7 F (36.5 C)   Ht 5'  3" (1.6 m)   Wt 187 lb (84.8 kg)   SpO2 97%   BMI 33.13 kg/m      11/27/2023    9:55 AM 11/27/2023    9:01 AM 11/25/2023    8:41 AM  BP/Weight  Systolic BP 154 171 188  Diastolic BP 93 91 77  Wt. (Lbs)  187   BMI  33.13 kg/m2     Physical Exam Vitals and nursing note reviewed.  Constitutional:      General: She is not in acute distress.    Appearance: She is obese.  HENT:     Head: Normocephalic and atraumatic.  Cardiovascular:     Rate and Rhythm: Regular rhythm. Bradycardia present.  Pulmonary:     Effort: Pulmonary effort is normal.     Breath sounds: Normal breath sounds. No wheezing, rhonchi or rales.  Abdominal:     General: There is no distension.     Palpations: Abdomen is soft.     Comments: Mild left lower quadrant tenderness to palpation.  Neurological:     Mental Status: She is alert.  Psychiatric:     Comments: Flat affect.  Depressed mood.     Lab Results  Component Value Date   WBC 7.2 07/06/2023   HGB 11.8 (L) 07/06/2023   HCT 36.1 07/06/2023   PLT 291 07/06/2023   GLUCOSE 133 (H) 10/02/2023   CHOL 163 10/02/2023   TRIG 213 (H) 10/02/2023   HDL 40 10/02/2023   LDLCALC 87 10/02/2023   ALT 14 10/02/2023   AST 19 10/02/2023   NA 142 10/02/2023   K 4.3 10/02/2023   CL 102 10/02/2023   CREATININE 0.98 10/02/2023   BUN 26 10/02/2023   CO2 23 10/02/2023   TSH 2.220 10/02/2023   INR 1.2 03/19/2023   HGBA1C 7.1 (A) 10/08/2023   MICROALBUR 10 mg/L 08/09/2022     Assessment & Plan:  UTI symptoms -     POCT URINALYSIS DIP (CLINITEK) -     Urine Culture  Anemia, unspecified type -     Iron, TIBC and Ferritin Panel -     CBC with Differential/Platelet  LLQ abdominal pain Assessment & Plan: Sending urine culture today.  Mildly tender on exam.  Labs for further evaluation.  Orders: -     CMP14+EGFR -     Lipase  Mental confusion -     MR BRAIN WO CONTRAST -     Ambulatory referral to Neurology  Altered mental status, unspecified  altered mental status type Assessment & Plan: Etiology unclear at this time.  Could be medication side effect.  Labs today.  Obtaining MRI  for further evaluation.  Referring to neurology.  Orders: -     MR BRAIN WO CONTRAST -     Ambulatory referral to Neurology    Follow-up:  1-2 weeks.  Everlene Other DO [facility]

## 2023-11-27 NOTE — Assessment & Plan Note (Signed)
 Etiology unclear at this time.  Could be medication side effect.  Labs today.  Obtaining MRI for further evaluation.  Referring to neurology.

## 2023-11-27 NOTE — Patient Instructions (Signed)
 Labs today.  Stop antibiotic.  Follow up in 1 week.   Arranging MRI.

## 2023-11-28 ENCOUNTER — Encounter: Payer: Self-pay | Admitting: Family Medicine

## 2023-11-28 LAB — IRON,TIBC AND FERRITIN PANEL
Ferritin: 51 ng/mL (ref 15–150)
Iron Saturation: 37 % (ref 15–55)
Iron: 139 ug/dL (ref 27–139)
Total Iron Binding Capacity: 371 ug/dL (ref 250–450)
UIBC: 232 ug/dL (ref 118–369)

## 2023-11-28 LAB — CBC WITH DIFFERENTIAL/PLATELET
Basophils Absolute: 0.1 10*3/uL (ref 0.0–0.2)
Basos: 1 %
EOS (ABSOLUTE): 0.2 10*3/uL (ref 0.0–0.4)
Eos: 4 %
Hematocrit: 36.1 % (ref 34.0–46.6)
Hemoglobin: 12.3 g/dL (ref 11.1–15.9)
Immature Grans (Abs): 0 10*3/uL (ref 0.0–0.1)
Immature Granulocytes: 0 %
Lymphocytes Absolute: 2.2 10*3/uL (ref 0.7–3.1)
Lymphs: 34 %
MCH: 32.5 pg (ref 26.6–33.0)
MCHC: 34.1 g/dL (ref 31.5–35.7)
MCV: 96 fL (ref 79–97)
Monocytes Absolute: 0.5 10*3/uL (ref 0.1–0.9)
Monocytes: 8 %
Neutrophils Absolute: 3.4 10*3/uL (ref 1.4–7.0)
Neutrophils: 53 %
Platelets: 284 10*3/uL (ref 150–450)
RBC: 3.78 x10E6/uL (ref 3.77–5.28)
RDW: 12.5 % (ref 11.7–15.4)
WBC: 6.4 10*3/uL (ref 3.4–10.8)

## 2023-11-28 LAB — CMP14+EGFR
ALT: 18 IU/L (ref 0–32)
AST: 20 IU/L (ref 0–40)
Albumin: 4.6 g/dL (ref 3.8–4.8)
Alkaline Phosphatase: 85 IU/L (ref 44–121)
BUN/Creatinine Ratio: 24 (ref 12–28)
BUN: 24 mg/dL (ref 8–27)
Bilirubin Total: 0.6 mg/dL (ref 0.0–1.2)
CO2: 24 mmol/L (ref 20–29)
Calcium: 9.9 mg/dL (ref 8.7–10.3)
Chloride: 102 mmol/L (ref 96–106)
Creatinine, Ser: 0.99 mg/dL (ref 0.57–1.00)
Globulin, Total: 3.2 g/dL (ref 1.5–4.5)
Glucose: 116 mg/dL — ABNORMAL HIGH (ref 70–99)
Potassium: 4.4 mmol/L (ref 3.5–5.2)
Sodium: 142 mmol/L (ref 134–144)
Total Protein: 7.8 g/dL (ref 6.0–8.5)
eGFR: 61 mL/min/{1.73_m2} (ref >59)

## 2023-11-28 LAB — LIPASE: Lipase: 32 U/L (ref 14–85)

## 2023-11-29 LAB — URINE CULTURE: Organism ID, Bacteria: NO GROWTH

## 2023-12-05 ENCOUNTER — Encounter: Payer: Self-pay | Admitting: Neurology

## 2023-12-05 ENCOUNTER — Ambulatory Visit (INDEPENDENT_AMBULATORY_CARE_PROVIDER_SITE_OTHER): Admitting: Neurology

## 2023-12-05 VITALS — BP 155/80 | HR 59 | Ht 64.0 in | Wt 186.5 lb

## 2023-12-05 DIAGNOSIS — R200 Anesthesia of skin: Secondary | ICD-10-CM

## 2023-12-05 DIAGNOSIS — R269 Unspecified abnormalities of gait and mobility: Secondary | ICD-10-CM | POA: Diagnosis not present

## 2023-12-05 DIAGNOSIS — Z853 Personal history of malignant neoplasm of breast: Secondary | ICD-10-CM | POA: Diagnosis not present

## 2023-12-05 DIAGNOSIS — G3184 Mild cognitive impairment, so stated: Secondary | ICD-10-CM | POA: Diagnosis not present

## 2023-12-05 DIAGNOSIS — R413 Other amnesia: Secondary | ICD-10-CM

## 2023-12-05 NOTE — Progress Notes (Signed)
 [facility]  PATIENT: Kimberly [name] DOB: 1951/06/24  REFERRING DOCTOR OR PCP:  Everlene Other, DO SOURCE: Patient, notes from primary care, imaging and lab results, MRI images personally reviewed.  _________________________________   HISTORICAL  CHIEF COMPLAINT:  Chief Complaint  Patient presents with   New Patient (Initial Visit)    Pt in room 11. Husband Kimberly [name] in room. Internal referral for AMS and confusion. Pt said short term memory is concern, forget words when talking. MOCA:19    HISTORY OF PRESENT ILLNESS:  I had the pleasure of seeing your patient, Kimberly [name], at [facility] for neurologic consultation regarding her memory difficulties.  She is a 73 yo woman who has noted some memory difficulty since June 2024.   She notes that she has had multiple hospital admissions for UTI.  She reports being unresponsive x 5 days with one.  Discharge summary reports delirium/AMS  Her husband and friends have also noted any memory issues before June 2024.    She also has more trouble with complex tasks (like income tax).   She has lost motivation to read and used to read regularly.     She is not walking as well.   Specifically, she is shuffling and sometimes leans to the right when she walks.  She has about 6 falls due to balance.     She notes urinary urgency and some incontinence, mostly at night  She has depression and is more irritable since June 2024.    She has some anxiety.    She sleep poorly many nights, often waking up and having trouble falling back asleep.   She snores but husband has not noted pauses, gasps, snorts.    MRI brain 11/27/2023 shows mild atrophy and microvascular ischemic changes with pari-atrial confluencies.  Ventricle size  proportionate to atrophy.   No specific lobar atrophy.    Labs 02/2023 showed mild ARF (creatinine1.7 at peak), low sodium (123 nadir).    Comorbidities: DM, HTN, H/o breat cancer (reportedly  cancer free), gpiter s/p thyroidectomy and synthroid replacement.        12/05/2023   10:51 AM  Montreal Cognitive Assessment   Visuospatial/ Executive (0/5) 3  Naming (0/3) 3  Attention: Read list of digits (0/2) 2  Attention: Read list of letters (0/1) 1  Attention: Serial 7 subtraction starting at 100 (0/3) 1  Language: Repeat phrase (0/2) 2  Language : Fluency (0/1) 0  Abstraction (0/2) 1  Delayed Recall (0/5) 1  Orientation (0/6) 5  Total 19     REVIEW OF SYSTEMS: Constitutional: No fevers, chills, sweats, or change in appetite Eyes: No visual changes, double vision, eye pain Ear, nose and throat: No hearing loss, ear pain, nasal congestion, sore throat Cardiovascular: No chest pain, palpitations Respiratory:  No shortness of breath at rest or with exertion.   No wheezes GastrointestinaI: No nausea, vomiting, diarrhea, abdominal pain, fecal incontinence Genitourinary:  No dysuria, urinary retention or frequency.  No nocturia. Musculoskeletal:  No neck pain, back pain Integumentary: No rash, pruritus, skin lesions Neurological: as above Psychiatric: No depression at this time.  No anxiety Endocrine: No palpitations, diaphoresis, change in appetite, change in weigh or increased thirst Hematologic/Lymphatic:  No anemia, purpura, petechiae. Allergic/Immunologic: No itchy/runny eyes, nasal congestion, recent allergic reactions, rashes  ALLERGIES: Allergies  Allergen Reactions   Bactrim [Sulfamethoxazole-Trimethoprim] Other (See Comments)    Was told during hospital admission by providers to not ever take this again as it made her  worse per patient   Ace Inhibitors Cough    HOME MEDICATIONS:  Current Outpatient Medications:    anastrozole (ARIMIDEX) 1 MG tablet, TAKE 1 TABLET DAILY, Disp: 90 tablet, Rfl: 3   clindamycin (CLINDAGEL) 1 % gel, Apply topically 2 (two) times daily., Disp: 30 g, Rfl: 0   famotidine (PEPCID) 20 MG tablet, TAKE 1 TABLET AT BEDTIME, Disp: 90  tablet, Rfl: 0   gabapentin (NEURONTIN) 100 MG capsule, TAKE 1 CAPSULE BY MOUTH THREE TIMES DAILY, Disp: 270 capsule, Rfl: 0   glipiZIDE (GLUCOTROL XL) 5 MG 24 hr tablet, Take 1 tablet (5 mg total) by mouth daily with breakfast., Disp: 90 tablet, Rfl: 3   hydrochlorothiazide (HYDRODIURIL) 25 MG tablet, Take 1 tablet (25 mg total) by mouth daily., Disp: 90 tablet, Rfl: 0   insulin degludec (TRESIBA FLEXTOUCH) 100 UNIT/ML FlexTouch Pen, Inject 34 Units into the skin at bedtime., Disp: 36 mL, Rfl: 3   levothyroxine (SYNTHROID) 125 MCG tablet, Take 1 tablet (125 mcg total) by mouth daily before breakfast., Disp: 90 tablet, Rfl: 3   losartan (COZAAR) 100 MG tablet, TAKE 1 TABLET EVERY MORNING, Disp: 90 tablet, Rfl: 0   Magnesium 250 MG TABS, Take 250 mg by mouth daily., Disp: , Rfl:    metFORMIN (GLUCOPHAGE-XR) 500 MG 24 hr tablet, Take 1 tablet (500 mg total) by mouth 2 (two) times daily with a meal., Disp: 180 tablet, Rfl: 3   metoprolol succinate (TOPROL-XL) 100 MG 24 hr tablet, TAKE 1 TABLET AT BEDTIME   WITH OR IMMEDIATELY        FOLLOWING A MEAL, Disp: 90 tablet, Rfl: 0   miconazole (MICOTIN) 2 % powder, Apply topically as needed for itching., Disp: 70 g, Rfl: 0   omeprazole (PRILOSEC) 20 MG capsule, Take 1 capsule (20 mg total) by mouth at bedtime., Disp: 90 capsule, Rfl: 3   ULTICARE MINI PEN NEEDLES 31G X 6 MM MISC, daily. as directed, Disp: , Rfl:    venlafaxine XR (EFFEXOR-XR) 75 MG 24 hr capsule, TAKE 1 CAPSULE BY MOUTH ONCE DAILY WITH BREAKFAST, Disp: 90 capsule, Rfl: 3  PAST MEDICAL HISTORY: Past Medical History:  Diagnosis Date   Anxiety    Arthritis    Depression    Diabetes mellitus    Family history of breast cancer    Family history of colon cancer    Family history of prostate cancer    Family history of skin cancer    GERD (gastroesophageal reflux disease)    High blood pressure    Hypothyroid    Obesity    PONV (postoperative nausea and vomiting)     PAST SURGICAL  HISTORY: Past Surgical History:  Procedure Laterality Date   ABDOMINAL HYSTERECTOMY     Bunions Right    CARPAL TUNNEL RELEASE Right    KNEE ARTHROSCOPY Right    MASTECTOMY MODIFIED RADICAL Right 07/29/2021   invasive ductal ca   THYROID SURGERY     removal   TOTAL KNEE ARTHROPLASTY Right 08/25/2013   Procedure: TOTAL KNEE ARTHROPLASTY;  Surgeon: Vickki Hearing, MD;  Location: [facility];  Service: Orthopedics;  Laterality: Right;   TOTAL KNEE ARTHROPLASTY Left 09/09/2014   Procedure: LEFT TOTAL KNEE ARTHROPLASTY;  Surgeon: Vickki Hearing, MD;  Location: [facility];  Service: Orthopedics;  Laterality: Left;    FAMILY HISTORY: Family History  Problem Relation Age of Onset   Diabetes Mother    Hypertension Mother    Congestive Heart Failure Father  Hypertension Father    Skin cancer Father 65       squamous cell on lip; metastatic   Hypertension Brother    Skin cancer Brother        squamous cell   Breast cancer Paternal Aunt        dx 48s d. 74s   Prostate cancer Paternal Uncle        dx 63s   Colon cancer Maternal Grandmother        dx 14s d. 50s   Diabetes Other    Cancer Cousin        unk type; possibly pancreatic    SOCIAL HISTORY: Social History   Socioeconomic History   Marital status: Married    Spouse name: Not on file   Number of children: 5   Years of education: Not on file   Highest education level: Not on file  Occupational History   Occupation: CNA    Employer: [employer]  Tobacco Use   Smoking status: Never   Smokeless tobacco: Never  Vaping Use   Vaping status: Never Used  Substance and Sexual Activity   Alcohol use: No   Drug use: No   Sexual activity: Yes    Birth control/protection: Surgical  Other Topics Concern   Not on file  Social History Narrative   Pt lives with husband Kimberly [name] and 1 dog.   Social Drivers of Corporate investment banker Strain: Low Risk  (02/16/2023)   Overall Financial Resource Strain (CARDIA)    Difficulty  of Paying Living Expenses: Not hard at all  Food Insecurity: No Food Insecurity (03/29/2023)   Hunger Vital Sign    Worried About Running Out of Food in the Last Year: Never true    Ran Out of Food in the Last Year: Never true  Transportation Needs: No Transportation Needs (03/29/2023)   PRAPARE - Administrator, Civil Service (Medical): No    Lack of Transportation (Non-Medical): No  Physical Activity: Insufficiently Active (02/16/2023)   Exercise Vital Sign    Days of Exercise per Week: 2 days    Minutes of Exercise per Session: 30 min  Stress: No Stress Concern Present (02/16/2023)   [employer] of Occupational Health - Occupational Stress Questionnaire    Feeling of Stress : Not at all  Social Connections: Socially Integrated (02/16/2023)   Social Connection and Isolation Panel [NHANES]    Frequency of Communication with Friends and Family: More than three times a week    Frequency of Social Gatherings with Friends and Family: More than three times a week    Attends Religious Services: More than 4 times per year    Active Member of [employer] or Organizations: Yes    Attends Banker Meetings: More than 4 times per year    Marital Status: Married  Catering manager Violence: Not At Risk (03/19/2023)   Humiliation, Afraid, Rape, and Kick questionnaire    Fear of Current or Ex-Partner: No    Emotionally Abused: No    Physically Abused: No    Sexually Abused: No       PHYSICAL EXAM  Vitals:   12/05/23 1043 12/05/23 1050  BP: (!) 176/85 (!) 155/80  Pulse: (!) 59   Weight: 186 lb 8 oz (84.6 kg)   Height: 5\' 4"  (1.626 m)     Body mass index is 32.01 kg/m.   General: The patient is well-developed and well-nourished and in no acute distress  HEENT:  Head is /AT.  Sclera are anicteric.   Neck: No carotid bruits are noted.  The neck is nontender.  Cardiovascular: The heart has a regular rate and rhythm with a normal S1 and S2. There were no murmurs,  gallops or rubs.    Skin: Extremities are without rash or  edema.  Musculoskeletal:  Back is nontender  Neurologic Exam  Mental status: The patient is alert and oriented x 2 1/2 at the time of the examination.  There is reduced short-term memory, and focus/attention.  She also lost points on the [facility] cognitive assessment for executive function.  Her score was 19/30 consistent with mild cognitive impairment.  Speech is normal.  Cranial nerves: Extraocular movements are full.   There is good facial sensation to soft touch bilaterally.Facial strength is normal.  Trapezius and sternocleidomastoid strength is normal. No dysarthria is noted.  The tongue is midline, and the patient has symmetric elevation of the soft palate. No obvious hearing deficits are noted.  Motor:  Muscle bulk is normal.   No tremor.   Tone is normal. Strength is  5 / 5 in all 4 extremities.   Sensory: Sensory testing is intact to pinprick, soft touch and vibration sensation in all 4 extremities.  Coordination: Cerebellar testing reveals good finger-nose-finger and heel-to-shin bilaterally.  Gait and station: Station is normal.   Gait has mildly reduced stride.  Tandem is poor.  Turn in 4 steps, mild retropulsion. . Romberg is negative.   Reflexes: Deep tendon reflexes are symmetric and normal bilaterally.   Plantar responses are flexor.    DIAGNOSTIC DATA (LABS, IMAGING, TESTING) - I reviewed patient records, labs, notes, testing and imaging myself where available.  Lab Results  Component Value Date   WBC 6.4 11/27/2023   HGB 12.3 11/27/2023   HCT 36.1 11/27/2023   MCV 96 11/27/2023   PLT 284 11/27/2023      Component Value Date/Time   NA 142 11/27/2023 1019   K 4.4 11/27/2023 1019   CL 102 11/27/2023 1019   CO2 24 11/27/2023 1019   GLUCOSE 116 (H) 11/27/2023 1019   GLUCOSE 170 (H) 07/06/2023 1105   BUN 24 11/27/2023 1019   CREATININE 0.99 11/27/2023 1019   CREATININE 1.06 08/11/2014 0701   CALCIUM  9.9 11/27/2023 1019   CALCIUM 10.9 (H) 12/28/2022 1452   PROT 7.8 11/27/2023 1019   ALBUMIN 4.6 11/27/2023 1019   AST 20 11/27/2023 1019   ALT 18 11/27/2023 1019   ALKPHOS 85 11/27/2023 1019   BILITOT 0.6 11/27/2023 1019   GFRNONAA >60 07/06/2023 1105   GFRAA 65 10/11/2020 1146   Lab Results  Component Value Date   CHOL 163 10/02/2023   HDL 40 10/02/2023   LDLCALC 87 10/02/2023   TRIG 213 (H) 10/02/2023   CHOLHDL 4.1 10/02/2023   Lab Results  Component Value Date   HGBA1C 7.1 (A) 10/08/2023   Lab Results  Component Value Date   VITAMINB12 352 05/14/2023   Lab Results  Component Value Date   TSH 2.220 10/02/2023       ASSESSMENT AND PLAN  Memory loss - Plan: ATN PROFILE  Mild cognitive impairment - Plan: ATN PROFILE  History of breast cancer - Plan: Anti-Hu, Anti-Ri, Anti-Yo IFA  Gait disturbance  Numbness - Plan: Multiple Myeloma Panel (SPEP&IFE w/QIG), Anti-Hu, Anti-Ri, Anti-Yo IFA   In summary, Kimberly [name] is a 73 year old woman who has had cognitive impairments first noted after a long hospitalization for urosepsis.  The MRI  of the brain showed fairly mild atrophy that would be normal for age and chronic microvascular ischemic change.  There were no acute findings.   She appeared to have worsened after a prolonged hospitalization for urosepsis.  Also on examination, she has mild numbness in her feet.  She had slightly impaired gait.   The etiology of her cognitive impairment is uncertain.  Therefore, we will check Alzheimer biomarkers including the amyloid 42/40 ratio and pTau181.  She also has numbness and we will check SPEP/IEF and anti Hu/Yo ((history of breast cancer).  Based on results consider donepezil.  I also discussed that poor sleep and mood disturbance can make cognitive impairments more symptomatic and she should try to get 7 hours of sleep.  Unfortunately she has significant anxiety and has had the most success with lorazepam.  If the gait  worsens, consider an MRI of the cervical spine.  She will return to see me based on the results of the studies and call if there are new or worsening neurologic symptoms.  Thank you for asking me to see Kimberly [name].  Please let me know if I can be of further assistance with her other patients in the future.  This visit is part of a comprehensive longitudinal care medical relationship regarding the patients primary diagnosis of memory loss and related concerns.   Jarmal Lewelling A. Epimenio Foot, MD, [facility] 12/05/2023, 11:31 AM Certified in Neurology, Clinical Neurophysiology, Sleep Medicine and Neuroimaging  [facility] [address] [place], [place] [address] [phone]

## 2023-12-08 LAB — ANTI-HU, ANTI-RI, ANTI-YO IFA
Anti-Hu Ab: NEGATIVE
Anti-Ri Ab: NEGATIVE
Anti-Yo Ab: NEGATIVE

## 2023-12-08 LAB — MULTIPLE MYELOMA PANEL, SERUM
Albumin SerPl Elph-Mcnc: 4.2 g/dL (ref 2.9–4.4)
Albumin/Glob SerPl: 1.2 (ref 0.7–1.7)
Alpha 1: 0.2 g/dL (ref 0.0–0.4)
Alpha2 Glob SerPl Elph-Mcnc: 0.9 g/dL (ref 0.4–1.0)
B-Globulin SerPl Elph-Mcnc: 1.1 g/dL (ref 0.7–1.3)
Gamma Glob SerPl Elph-Mcnc: 1.5 g/dL (ref 0.4–1.8)
Globulin, Total: 3.6 g/dL (ref 2.2–3.9)
IgA/Immunoglobulin A, Serum: 181 mg/dL (ref 64–422)
IgG (Immunoglobin G), Serum: 1452 mg/dL (ref 586–1602)
IgM (Immunoglobulin M), Srm: 171 mg/dL (ref 26–217)
M Protein SerPl Elph-Mcnc: 0.1 g/dL — ABNORMAL HIGH
Total Protein: 7.8 g/dL (ref 6.0–8.5)

## 2023-12-08 LAB — ATN PROFILE
A -- Beta-amyloid 42/40 Ratio: 0.095 — ABNORMAL LOW (ref >0.102)
Beta-amyloid 40: 237.64 pg/mL
Beta-amyloid 42: 22.52 pg/mL
N -- NfL, Plasma: 4.81 pg/mL (ref 0.00–7.64)
T -- p-tau181: 1.55 pg/mL — ABNORMAL HIGH (ref 0.00–0.97)

## 2023-12-10 ENCOUNTER — Telehealth: Payer: Self-pay | Admitting: Neurology

## 2023-12-10 ENCOUNTER — Other Ambulatory Visit: Payer: Self-pay | Admitting: Neurology

## 2023-12-10 MED ORDER — DONEPEZIL HCL 5 MG PO TABS: 5.0000 mg | ORAL_TABLET | Freq: Every day | ORAL | 3 refills | Status: DC

## 2023-12-10 NOTE — Telephone Encounter (Signed)
 I spoke to Mr. and Mrs. Douthitt about the lab results.  2 of the Alzheimer's disease biomarkers (amyloid beta 42/40 ratio and pTau181) came back abnormal implying a 90 to 92% likelihood of Alzheimer's disease.  I will send in a prescription for donepezil.  The SPEP/IEF did show a small amount of a paraprotein.  Since the amount was low and she has just mild neuropathy, I will recheck this at the next visit and refer her to hematology if confirmed.  Anti-Hu/anti-Yo were negative

## 2023-12-14 ENCOUNTER — Encounter: Payer: Self-pay | Admitting: Family Medicine

## 2023-12-14 ENCOUNTER — Ambulatory Visit (INDEPENDENT_AMBULATORY_CARE_PROVIDER_SITE_OTHER): Admitting: Family Medicine

## 2023-12-14 VITALS — BP 150/66 | HR 59 | Temp 97.5°F | Ht 64.0 in | Wt 185.0 lb

## 2023-12-14 DIAGNOSIS — G301 Alzheimer's disease with late onset: Secondary | ICD-10-CM | POA: Diagnosis not present

## 2023-12-14 DIAGNOSIS — F02A0 Dementia in other diseases classified elsewhere, mild, without behavioral disturbance, psychotic disturbance, mood disturbance, and anxiety: Secondary | ICD-10-CM | POA: Diagnosis not present

## 2023-12-14 DIAGNOSIS — I10 Essential (primary) hypertension: Secondary | ICD-10-CM

## 2023-12-14 NOTE — Patient Instructions (Signed)
 Follow up in 3 months.  Message with concerns.  Take care  Dr. Adriana Simas

## 2023-12-16 DIAGNOSIS — F0280 Dementia in other diseases classified elsewhere without behavioral disturbance: Secondary | ICD-10-CM | POA: Insufficient documentation

## 2023-12-16 MED ORDER — LOSARTAN POTASSIUM 100 MG PO TABS: 100.0000 mg | ORAL_TABLET | Freq: Every morning | ORAL | 3 refills | Status: AC

## 2023-12-16 MED ORDER — HYDROCHLOROTHIAZIDE 25 MG PO TABS
25.0000 mg | ORAL_TABLET | Freq: Every day | ORAL | 3 refills | Status: AC
Start: 2023-12-16 — End: ?

## 2023-12-16 MED ORDER — METOPROLOL SUCCINATE ER 100 MG PO TB24: ORAL_TABLET | ORAL | 3 refills | Status: AC

## 2023-12-16 NOTE — Assessment & Plan Note (Signed)
 No diagnosis.  Follows with neurology.  The majority of this visit was discussing prognosis/progression as well as answering questions from the patient and daughter.

## 2023-12-16 NOTE — Assessment & Plan Note (Signed)
 BP mildly elevated here today.  Meds refilled.  Will continue to monitor.

## 2023-12-16 NOTE — Progress Notes (Signed)
 Subjective:  Patient ID: Kimberly [name], female    DOB: 1951-04-16  Age: 73 y.o. MRN: [id number]  CC:   Chief Complaint  Patient presents with   Memory Loss    Alzheimers disease     HPI:  73 year old female presents for follow-up.  Patient is accompanied by her daughter today.  Recent diagnosis of Alzheimer's dementia.  Patient and daughter have several questions today (Driving, prognosis/progression, etc).  Neurology recently started donepezil .  She is tolerating.  BP mildly elevated here today.  Patient is on losartan  and HCTZ.  Tolerating.  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) 09/05/2021   Vitamin D  deficiency 09/05/2021   Breast cancer of upper-outer quadrant of right female breast (HCC) 08/31/2021   S/P mastectomy, right 07/29/2021   Urinary incontinence 05/07/2020   Depression 12/29/2015   S/P knee replacement 09/02/2013   Essential hypertension, benign 07/06/2013   Hypothyroidism 07/06/2013    Social Hx   Social History   Socioeconomic History   Marital status: Married    Spouse name: Not on file   Number of children: 5   Years of education: Not on file   Highest education level: Not on file  Occupational History   Occupation: CNA    Employer: [employer]  Tobacco Use   Smoking status: Never   Smokeless tobacco: Never  Vaping Use   Vaping status: Never Used  Substance and Sexual Activity   Alcohol use: No   Drug use: No   Sexual activity: Yes    Birth control/protection: Surgical  Other Topics Concern   Not on file  Social History Narrative   Pt lives with husband Joe and 1 dog.   Social Drivers of Corporate investment banker Strain: Low Risk  (02/16/2023)   Overall  Financial Resource Strain (CARDIA)    Difficulty of Paying Living Expenses: Not hard at all  Food Insecurity: No Food Insecurity (03/29/2023)   Hunger Vital Sign    Worried About Running Out of Food in the Last Year: Never true    Ran Out of Food in the Last Year: Never true  Transportation Needs: No Transportation Needs (03/29/2023)   PRAPARE - Administrator, Civil Service (Medical): No    Lack of Transportation (Non-Medical): No  Physical Activity: Insufficiently Active (02/16/2023)   Exercise Vital Sign    Days of Exercise per Week: 2 days    Minutes of Exercise per Session: 30 min  Stress: No Stress Concern Present (02/16/2023)   [employer] of Occupational Health - Occupational Stress Questionnaire    Feeling of Stress : Not at all  Social Connections: Socially Integrated (02/16/2023)   Social Connection and Isolation Panel [NHANES]    Frequency of Communication with Friends and Family: More than three times a week    Frequency of Social Gatherings with Friends and Family: More than three times a week    Attends Religious Services: More than 4 times per year    Active Member of [employer] or Organizations: Yes    Attends Engineer, structural: More than 4 times per year    Marital Status: Married    Review of Systems Per HPI  Objective:  BP Aaron Aas)  150/66   Pulse (!) 59   Temp (!) 97.5 F (36.4 C)   Ht 5\' 4"  (1.626 m)   Wt 185 lb (83.9 kg)   SpO2 98%   BMI 31.76 kg/m      12/14/2023   11:14 AM 12/14/2023   10:37 AM 12/05/2023   10:50 AM  BP/Weight  Systolic BP 150 179 155  Diastolic BP 66 79 80  Wt. (Lbs)  185   BMI  31.76 kg/m2     Physical Exam Vitals and nursing note reviewed.  Constitutional:      General: She is not in acute distress.    Appearance: Normal appearance.  HENT:     Head: Normocephalic and atraumatic.  Cardiovascular:     Rate and Rhythm: Normal rate and regular rhythm.  Pulmonary:     Effort: Pulmonary effort is normal.      Breath sounds: Normal breath sounds.  Neurological:     Mental Status: She is alert. Mental status is at baseline.     Lab Results  Component Value Date   WBC 6.4 11/27/2023   HGB 12.3 11/27/2023   HCT 36.1 11/27/2023   PLT 284 11/27/2023   GLUCOSE 116 (H) 11/27/2023   CHOL 163 10/02/2023   TRIG 213 (H) 10/02/2023   HDL 40 10/02/2023   LDLCALC 87 10/02/2023   ALT 18 11/27/2023   AST 20 11/27/2023   NA 142 11/27/2023   K 4.4 11/27/2023   CL 102 11/27/2023   CREATININE 0.99 11/27/2023   BUN 24 11/27/2023   CO2 24 11/27/2023   TSH 2.220 10/02/2023   INR 1.2 03/19/2023   HGBA1C 7.1 (A) 10/08/2023   MICROALBUR 10 mg/L 08/09/2022     Assessment & Plan:  Mild late onset Alzheimer's dementia, unspecified whether behavioral, psychotic, or mood disturbance or anxiety (HCC) Assessment & Plan: No diagnosis.  Follows with neurology.  The majority of this visit was discussing prognosis/progression as well as answering questions from the patient and daughter.   Essential hypertension, benign Assessment & Plan: BP mildly elevated here today.  Meds refilled.  Will continue to monitor.  Orders: -     hydroCHLOROthiazide ; Take 1 tablet (25 mg total) by mouth daily.  Dispense: 90 tablet; Refill: 3 -     Metoprolol  Succinate ER; TAKE 1 TABLET AT BEDTIME   WITH OR IMMEDIATELY        FOLLOWING A MEAL  Dispense: 90 tablet; Refill: 3  Other orders -     Losartan  Potassium; Take 1 tablet (100 mg total) by mouth every morning.  Dispense: 90 tablet; Refill: 3    Follow-up:  3 months  Hlee Fringer Debrah Fan DO [facility]

## 2023-12-20 ENCOUNTER — Encounter: Payer: Self-pay | Admitting: Nurse Practitioner

## 2023-12-20 ENCOUNTER — Other Ambulatory Visit: Payer: Self-pay | Admitting: *Deleted

## 2023-12-20 ENCOUNTER — Other Ambulatory Visit: Payer: Self-pay | Admitting: Family Medicine

## 2023-12-20 DIAGNOSIS — K219 Gastro-esophageal reflux disease without esophagitis: Secondary | ICD-10-CM

## 2023-12-20 MED ORDER — OMEPRAZOLE 20 MG PO CPDR: 20.0000 mg | DELAYED_RELEASE_CAPSULE | Freq: Every day | ORAL | 3 refills | Status: DC

## 2023-12-20 MED ORDER — ANASTROZOLE 1 MG PO TABS: 1.0000 mg | ORAL_TABLET | Freq: Every day | ORAL | 3 refills | Status: AC

## 2023-12-20 NOTE — Telephone Encounter (Signed)
 Copied from CRM [id number]. Topic: Clinical - Medication Refill >> Dec 20, 2023  1:03 PM Star East wrote: Most Recent Primary Care Visit:  Provider: Myrna Ast  Department: [facility]  Visit Type: OFFICE VISIT  Date: 12/14/2023  Medication: omeprazole  (PRILOSEC) 20 MG capsule  Has the patient contacted their pharmacy? Yes (Agent: If no, request that the patient contact the pharmacy for the refill. If patient does not wish to contact the pharmacy document the reason why and proceed with request.) (Agent: If yes, when and what did the pharmacy advise?)  Is this the correct pharmacy for this prescription? Yes If no, delete pharmacy and type the correct one.  This is the patient's preferred pharmacy:  [facility] [address] [place] - [address] [place] [address] Phone: [phone] Fax: [phone]  [facility] [address], [place] - [address] [place] [address] [place] [address] Phone: [phone] Fax: [phone]  [facility] - [place], [place] - [address] [place] [address] Phone: [phone] Fax: [phone]  [facility] - [place], [place] - [address] [facility] [address] Portal to Registered [facility] Sites [address] [address] Phone: [phone] Fax: [phone]   Has the prescription been filled recently? Yes  Is the patient out of the medication? No  Has the patient been seen for an appointment in the last year OR does the patient have an upcoming appointment? Yes  Can we respond through MyChart? Yes  Agent: Please be advised that Rx refills may take up to 3 business days. We ask that you follow-up with your pharmacy.

## 2023-12-20 NOTE — Telephone Encounter (Signed)
 See patient message from daughter.

## 2023-12-24 ENCOUNTER — Other Ambulatory Visit (HOSPITAL_COMMUNITY): Payer: Self-pay

## 2023-12-24 ENCOUNTER — Telehealth: Payer: Self-pay

## 2023-12-24 NOTE — Telephone Encounter (Signed)
 Pharmacy Patient Advocate Encounter   Received notification from CoverMyMeds that prior authorization for Tresiba  is required/requested.   Insurance verification completed.   The patient is insured through [employer] .   Per test claim:  Basaglar  is preferred by the insurance.  If suggested medication is appropriate, Please send in a new RX and discontinue this one. If not, please advise as to why it's not appropriate so that we may request a Prior Authorization. Please note, some preferred medications may still require a PA.  If the suggested medications have not been trialed and there are no contraindications to their use, the PA will not be submitted, as it will not be approved.

## 2023-12-25 ENCOUNTER — Other Ambulatory Visit: Payer: Self-pay | Admitting: *Deleted

## 2023-12-25 DIAGNOSIS — E1121 Type 2 diabetes mellitus with diabetic nephropathy: Secondary | ICD-10-CM

## 2023-12-25 DIAGNOSIS — Z794 Long term (current) use of insulin: Secondary | ICD-10-CM

## 2023-12-25 DIAGNOSIS — E1122 Type 2 diabetes mellitus with diabetic chronic kidney disease: Secondary | ICD-10-CM

## 2023-12-25 DIAGNOSIS — Z7984 Long term (current) use of oral hypoglycemic drugs: Secondary | ICD-10-CM

## 2023-12-25 MED ORDER — METFORMIN HCL ER 500 MG PO TB24: 500.0000 mg | ORAL_TABLET | Freq: Two times a day (BID) | ORAL | 3 refills | Status: DC

## 2023-12-25 MED ORDER — GLIPIZIDE ER 5 MG PO TB24: 5.0000 mg | ORAL_TABLET | Freq: Every day | ORAL | 3 refills | Status: DC

## 2023-12-25 MED ORDER — BASAGLAR KWIKPEN 100 UNIT/ML ~~LOC~~ SOPN: 34.0000 [IU] | PEN_INJECTOR | Freq: Every day | SUBCUTANEOUS | 0 refills | Status: DC

## 2023-12-25 NOTE — Telephone Encounter (Signed)
 A script has been sent for the Basaglar  to [place], [place]

## 2023-12-27 ENCOUNTER — Ambulatory Visit: Payer: Self-pay

## 2023-12-27 ENCOUNTER — Other Ambulatory Visit: Payer: Self-pay

## 2023-12-27 ENCOUNTER — Telehealth: Payer: Self-pay | Admitting: Family Medicine

## 2023-12-27 DIAGNOSIS — E559 Vitamin D deficiency, unspecified: Secondary | ICD-10-CM

## 2023-12-27 DIAGNOSIS — K219 Gastro-esophageal reflux disease without esophagitis: Secondary | ICD-10-CM

## 2023-12-27 DIAGNOSIS — C50411 Malignant neoplasm of upper-outer quadrant of right female breast: Secondary | ICD-10-CM

## 2023-12-27 MED ORDER — OMEPRAZOLE 20 MG PO CPDR: 20.0000 mg | DELAYED_RELEASE_CAPSULE | Freq: Every day | ORAL | 3 refills | Status: AC

## 2023-12-27 MED ORDER — GABAPENTIN 100 MG PO CAPS: 100.0000 mg | ORAL_CAPSULE | Freq: Three times a day (TID) | ORAL | 0 refills | Status: DC

## 2023-12-27 MED ORDER — VENLAFAXINE HCL ER 75 MG PO CP24: ORAL_CAPSULE | ORAL | 3 refills | Status: DC

## 2023-12-27 NOTE — Telephone Encounter (Signed)
 Chief Complaint: flank pain Symptoms: flank pain, foul-smelling urine Frequency: last Friday Pertinent Negatives: Patient denies fever, chills, vomiting, hematuria  Disposition: [] ED /[] Urgent Care (no appt availability in office) / [x] Appointment(In office/virtual)/ []  [facility]/ [] Home Care/ [] Refused Recommended Disposition /[] [place] [facility]/ []  Follow-up with PCP Additional Notes: Pt reports urinary symptoms since last Friday. Pt reports foul-smelling urine and bilateral flank pain. Pt states she is not drinking enough water since her husband died very recently but is encouraging fluids. Pt denies fever, chills, N/V, hematuria. Hx of chronic kidney infections. Pt only wants to see Dr. Debrah Fan. RN scheduled pt for tomorrow at 1120 with Dr. Debrah Fan. RN advised pt if she gets worse to call us  back. Pt verbalized understanding.    Copied from CRM [id number]. Topic: Clinical - Red Word Triage >> Dec 27, 2023  8:50 AM Elle L wrote: Red Word that prompted transfer to Nurse Triage: The patient states she has chronic kidney infections and is requesting to come in for an urinalysis. She advised me that she is having pain in her back and side and her Husband passed away recently and she keeps forgetting. Reason for Disposition  Side (flank) or lower back pain present  Answer Assessment - Initial Assessment Questions 1. SYMPTOM: "What's the main symptom you're concerned about?" (e.g., frequency, incontinence)     Foul smell to urine, "kidney pain", not drinking enough water 2. ONSET: "When did the symptoms start?"     Last Friday 3. PAIN: "Is there any pain?" If Yes, ask: "How bad is it?" (Scale: 1-10; mild, moderate, severe)     Flank pain started last Friday - bilateral. "It's more than 5, when I wake up it's an 8" 4. CAUSE: "What do you think is causing the symptoms?"     Hx of chronic kidney infections 5. OTHER SYMPTOMS: "Do you have any other symptoms?" (e.g., blood in urine, fever,  flank pain, pain with urination)     Denies fever or chills. Endorses nausea two days ago. Denies diarrhea. Denies hematuria. Pt states her husband died very recently. Pt states she is forgetting to drink.  Protocols used: Urinary Symptoms-A-AH

## 2023-12-27 NOTE — Telephone Encounter (Signed)
 Copied from CRM [id number]. Topic: Clinical - Prescription Issue >> Dec 27, 2023  1:37 PM Stanly Early wrote: Reason for CRM: Jullie Oiler from [facility] pharmacy called for RX omeprazole  20mg  effexor  75mg  gabapentin  100mg  to be sent over to the pharmacy.

## 2023-12-28 ENCOUNTER — Ambulatory Visit (INDEPENDENT_AMBULATORY_CARE_PROVIDER_SITE_OTHER): Admitting: Family Medicine

## 2023-12-28 VITALS — BP 118/63 | Ht 64.0 in | Wt 179.8 lb

## 2023-12-28 DIAGNOSIS — R300 Dysuria: Secondary | ICD-10-CM

## 2023-12-28 LAB — POCT URINALYSIS DIPSTICK
Spec Grav, UA: 1.01 (ref 1.010–1.025)
pH, UA: 6 (ref 5.0–8.0)

## 2023-12-28 MED ORDER — CEPHALEXIN 500 MG PO CAPS
500.0000 mg | ORAL_CAPSULE | Freq: Two times a day (BID) | ORAL | 0 refills | Status: DC
Start: 2023-12-28 — End: 2024-03-12

## 2023-12-28 NOTE — Patient Instructions (Signed)
 Antibiotic as prescribed.  While awaiting culture.

## 2023-12-30 DIAGNOSIS — R300 Dysuria: Secondary | ICD-10-CM | POA: Insufficient documentation

## 2023-12-30 LAB — URINE CULTURE

## 2023-12-30 LAB — SPECIMEN STATUS REPORT

## 2023-12-30 NOTE — Assessment & Plan Note (Signed)
 UA with trace leukocytes.  Placing on Keflex  while awaiting culture.

## 2023-12-30 NOTE — Progress Notes (Signed)
 Subjective:  Patient ID: Kimberly [name], female    DOB: [birth year]  Age: 73 y.o. MRN: [id number]  CC:   Chief Complaint  Patient presents with   Dysuria    HPI:  73 year old female presents with for concerns of UTI.  Symptoms over the past few days.  Reports back discomfort abdominal discomfort, urinary urgency, and odor.  Mild dysuria.  Has a history of UTI.  She is concerned that this is the case.  No fever.  No other complaints or concerns at this time.   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) 09/05/2021   Vitamin D  deficiency 09/05/2021   Breast cancer of upper-outer quadrant of right female breast (HCC) 08/31/2021   S/P mastectomy, right 07/29/2021   Urinary incontinence 05/07/2020   Depression 12/29/2015   S/P knee replacement 09/02/2013   Essential hypertension, benign 07/06/2013   Hypothyroidism 07/06/2013    Social Hx   Social History   Socioeconomic History   Marital status: Married    Spouse name: Not on file   Number of children: 5   Years of education: Not on file   Highest education level: Not on file  Occupational History   Occupation: CNA    Employer: [employer]  Tobacco Use   Smoking status: Never   Smokeless tobacco: Never  Vaping Use   Vaping status: Never Used  Substance and Sexual Activity   Alcohol use: No   Drug use: No   Sexual activity: Yes    Birth control/protection: Surgical  Other Topics Concern   Not on file  Social History Narrative   Pt lives with husband Joe and 1 dog.   Social Drivers of Corporate investment banker Strain: Low Risk  (02/16/2023)   Overall Financial Resource Strain (CARDIA)    Difficulty of Paying Living  Expenses: Not hard at all  Food Insecurity: No Food Insecurity (03/29/2023)   Hunger Vital Sign    Worried About Running Out of Food in the Last Year: Never true    Ran Out of Food in the Last Year: Never true  Transportation Needs: No Transportation Needs (03/29/2023)   PRAPARE - Administrator, Civil Service (Medical): No    Lack of Transportation (Non-Medical): No  Physical Activity: Insufficiently Active (02/16/2023)   Exercise Vital Sign    Days of Exercise per Week: 2 days    Minutes of Exercise per Session: 30 min  Stress: No Stress Concern Present (02/16/2023)   [employer] of Occupational Health - Occupational Stress Questionnaire    Feeling of Stress : Not at all  Social Connections: Socially Integrated (02/16/2023)   Social Connection and Isolation Panel [NHANES]    Frequency of Communication with Friends and Family: More than three times a week    Frequency of Social Gatherings with Friends and Family: More than three times a week    Attends Religious Services: More than 4 times per year    Active Member of [employer] or Organizations: Yes    Attends Engineer, structural: More than 4 times per year    Marital Status: Married    Review of Systems Per HPI  Objective:  BP 118/63  Ht 5\' 4"  (1.626 m)   Wt 179 lb 12.8 oz (81.6 kg)   BMI 30.86 kg/m      12/28/2023   11:17 AM 12/14/2023   11:14 AM 12/14/2023   10:37 AM  BP/Weight  Systolic BP 118 150 179  Diastolic BP 63 66 79  Wt. (Lbs) 179.8  185  BMI 30.86 kg/m2  31.76 kg/m2    Physical Exam Constitutional:      General: She is not in acute distress.    Appearance: Normal appearance.  HENT:     Head: Normocephalic and atraumatic.  Cardiovascular:     Rate and Rhythm: Normal rate and regular rhythm.  Pulmonary:     Effort: Pulmonary effort is normal.     Breath sounds: Normal breath sounds.  Abdominal:     General: There is no distension.     Palpations: Abdomen is soft.     Tenderness:  There is no abdominal tenderness.  Neurological:     Mental Status: She is alert.     Lab Results  Component Value Date   WBC 6.4 11/27/2023   HGB 12.3 11/27/2023   HCT 36.1 11/27/2023   PLT 284 11/27/2023   GLUCOSE 116 (H) 11/27/2023   CHOL 163 10/02/2023   TRIG 213 (H) 10/02/2023   HDL 40 10/02/2023   LDLCALC 87 10/02/2023   ALT 18 11/27/2023   AST 20 11/27/2023   NA 142 11/27/2023   K 4.4 11/27/2023   CL 102 11/27/2023   CREATININE 0.99 11/27/2023   BUN 24 11/27/2023   CO2 24 11/27/2023   TSH 2.220 10/02/2023   INR 1.2 03/19/2023   HGBA1C 7.1 (A) 10/08/2023   MICROALBUR 10 mg/L 08/09/2022     Assessment & Plan:  Dysuria Assessment & Plan: UA with trace leukocytes.  Placing on Keflex  while awaiting culture.  Orders: -     POCT urinalysis dipstick -     Cephalexin ; Take 1 capsule (500 mg total) by mouth 2 (two) times daily.  Dispense: 14 capsule; Refill: 0 -     Urine Culture -     Specimen status report    Follow-up:  Return if symptoms worsen or fail to improve.  Kathleen Papa DO [facility]

## 2023-12-31 ENCOUNTER — Inpatient Hospital Stay: Payer: Medicare Other | Attending: Hematology

## 2023-12-31 ENCOUNTER — Ambulatory Visit (HOSPITAL_COMMUNITY)
Admission: RE | Admit: 2023-12-31 | Discharge: 2023-12-31 | Disposition: A | Discharge status: Home / Self Care | Payer: Medicare Other | Source: Ambulatory Visit | Attending: Oncology | Admitting: Oncology

## 2023-12-31 DIAGNOSIS — Z1231 Encounter for screening mammogram for malignant neoplasm of breast: Secondary | ICD-10-CM | POA: Insufficient documentation

## 2023-12-31 DIAGNOSIS — Z87440 Personal history of urinary (tract) infections: Secondary | ICD-10-CM | POA: Insufficient documentation

## 2023-12-31 DIAGNOSIS — D649 Anemia, unspecified: Secondary | ICD-10-CM | POA: Diagnosis not present

## 2023-12-31 DIAGNOSIS — Z803 Family history of malignant neoplasm of breast: Secondary | ICD-10-CM | POA: Diagnosis not present

## 2023-12-31 DIAGNOSIS — Z9011 Acquired absence of right breast and nipple: Secondary | ICD-10-CM | POA: Diagnosis not present

## 2023-12-31 DIAGNOSIS — Z79899 Other long term (current) drug therapy: Secondary | ICD-10-CM | POA: Diagnosis not present

## 2023-12-31 DIAGNOSIS — Z800 Family history of malignant neoplasm of digestive organs: Secondary | ICD-10-CM | POA: Insufficient documentation

## 2023-12-31 DIAGNOSIS — Z79811 Long term (current) use of aromatase inhibitors: Secondary | ICD-10-CM | POA: Diagnosis not present

## 2023-12-31 DIAGNOSIS — R232 Flushing: Secondary | ICD-10-CM | POA: Diagnosis not present

## 2023-12-31 DIAGNOSIS — F32A Depression, unspecified: Secondary | ICD-10-CM | POA: Diagnosis not present

## 2023-12-31 DIAGNOSIS — C50411 Malignant neoplasm of upper-outer quadrant of right female breast: Secondary | ICD-10-CM | POA: Insufficient documentation

## 2023-12-31 DIAGNOSIS — Z170 Estrogen receptor positive status [ER+]: Secondary | ICD-10-CM | POA: Insufficient documentation

## 2023-12-31 DIAGNOSIS — L659 Nonscarring hair loss, unspecified: Secondary | ICD-10-CM | POA: Insufficient documentation

## 2023-12-31 DIAGNOSIS — E559 Vitamin D deficiency, unspecified: Secondary | ICD-10-CM

## 2023-12-31 DIAGNOSIS — M2550 Pain in unspecified joint: Secondary | ICD-10-CM | POA: Insufficient documentation

## 2023-12-31 DIAGNOSIS — D472 Monoclonal gammopathy: Secondary | ICD-10-CM | POA: Insufficient documentation

## 2023-12-31 DIAGNOSIS — R5383 Other fatigue: Secondary | ICD-10-CM | POA: Diagnosis not present

## 2023-12-31 DIAGNOSIS — F419 Anxiety disorder, unspecified: Secondary | ICD-10-CM | POA: Insufficient documentation

## 2023-12-31 DIAGNOSIS — G309 Alzheimer's disease, unspecified: Secondary | ICD-10-CM | POA: Insufficient documentation

## 2023-12-31 LAB — CBC WITH DIFFERENTIAL/PLATELET
Abs Immature Granulocytes: 0 10*3/uL (ref 0.00–0.07)
Basophils Absolute: 0.1 10*3/uL (ref 0.0–0.1)
Basophils Relative: 1 %
Eosinophils Absolute: 0.3 10*3/uL (ref 0.0–0.5)
Eosinophils Relative: 4 %
HCT: 35.1 % — ABNORMAL LOW (ref 36.0–46.0)
Hemoglobin: 11.6 g/dL — ABNORMAL LOW (ref 12.0–15.0)
Immature Granulocytes: 0 %
Lymphocytes Relative: 40 %
Lymphs Abs: 2.9 10*3/uL (ref 0.7–4.0)
MCH: 32.7 pg (ref 26.0–34.0)
MCHC: 33 g/dL (ref 30.0–36.0)
MCV: 98.9 fL (ref 80.0–100.0)
Monocytes Absolute: 0.6 10*3/uL (ref 0.1–1.0)
Monocytes Relative: 9 %
Neutro Abs: 3.4 10*3/uL (ref 1.7–7.7)
Neutrophils Relative %: 46 %
Platelets: 294 10*3/uL (ref 150–400)
RBC: 3.55 MIL/uL — ABNORMAL LOW (ref 3.87–5.11)
RDW: 12.9 % (ref 11.5–15.5)
WBC: 7.2 10*3/uL (ref 4.0–10.5)
nRBC: 0 % (ref 0.0–0.2)

## 2023-12-31 LAB — COMPREHENSIVE METABOLIC PANEL WITH GFR
ALT: 40 U/L (ref 0–44)
AST: 39 U/L (ref 15–41)
Albumin: 4.3 g/dL (ref 3.5–5.0)
Alkaline Phosphatase: 69 U/L (ref 38–126)
Anion gap: 10 (ref 5–15)
BUN: 23 mg/dL (ref 8–23)
CO2: 27 mmol/L (ref 22–32)
Calcium: 9.8 mg/dL (ref 8.9–10.3)
Chloride: 100 mmol/L (ref 98–111)
Creatinine, Ser: 0.95 mg/dL (ref 0.44–1.00)
GFR, Estimated: >60 mL/min (ref >60)
Glucose, Bld: 121 mg/dL — ABNORMAL HIGH (ref 70–99)
Potassium: 4.1 mmol/L (ref 3.5–5.1)
Sodium: 137 mmol/L (ref 135–145)
Total Bilirubin: 0.8 mg/dL (ref 0.0–1.2)
Total Protein: 7.8 g/dL (ref 6.5–8.1)

## 2023-12-31 LAB — VITAMIN D 25 HYDROXY (VIT D DEFICIENCY, FRACTURES): Vit D, 25-Hydroxy: 45.25 ng/mL (ref 30–100)

## 2024-01-08 ENCOUNTER — Other Ambulatory Visit: Payer: Self-pay | Admitting: Nurse Practitioner

## 2024-01-08 DIAGNOSIS — E039 Hypothyroidism, unspecified: Secondary | ICD-10-CM

## 2024-01-17 ENCOUNTER — Ambulatory Visit (HOSPITAL_COMMUNITY)
Admission: RE | Admit: 2024-01-17 | Discharge: 2024-01-17 | Disposition: A | Discharge status: Home / Self Care | Source: Ambulatory Visit | Attending: Oncology | Admitting: Oncology

## 2024-01-17 ENCOUNTER — Inpatient Hospital Stay (HOSPITAL_BASED_OUTPATIENT_CLINIC_OR_DEPARTMENT_OTHER): Payer: Medicare Other | Admitting: Oncology

## 2024-01-17 VITALS — BP 152/64 | HR 58 | Temp 98.3°F | Resp 18 | Wt 185.2 lb

## 2024-01-17 DIAGNOSIS — D472 Monoclonal gammopathy: Secondary | ICD-10-CM | POA: Insufficient documentation

## 2024-01-17 DIAGNOSIS — Z9011 Acquired absence of right breast and nipple: Secondary | ICD-10-CM | POA: Diagnosis not present

## 2024-01-17 DIAGNOSIS — Z170 Estrogen receptor positive status [ER+]: Secondary | ICD-10-CM | POA: Diagnosis not present

## 2024-01-17 DIAGNOSIS — Z79899 Other long term (current) drug therapy: Secondary | ICD-10-CM | POA: Diagnosis not present

## 2024-01-17 DIAGNOSIS — M47816 Spondylosis without myelopathy or radiculopathy, lumbar region: Secondary | ICD-10-CM | POA: Diagnosis not present

## 2024-01-17 DIAGNOSIS — C50411 Malignant neoplasm of upper-outer quadrant of right female breast: Secondary | ICD-10-CM

## 2024-01-17 DIAGNOSIS — M47812 Spondylosis without myelopathy or radiculopathy, cervical region: Secondary | ICD-10-CM | POA: Diagnosis not present

## 2024-01-17 DIAGNOSIS — Z79811 Long term (current) use of aromatase inhibitors: Secondary | ICD-10-CM

## 2024-01-17 DIAGNOSIS — E559 Vitamin D deficiency, unspecified: Secondary | ICD-10-CM | POA: Diagnosis not present

## 2024-01-17 DIAGNOSIS — G309 Alzheimer's disease, unspecified: Secondary | ICD-10-CM | POA: Diagnosis not present

## 2024-01-17 NOTE — Progress Notes (Signed)
 [facility] [address] [place], [place] [address]    Clinic Day:  01/17/2024  Referring physician: Cook, [name], DO  Patient Care Team: Cook, [name], DO as PCP - General (Family Medicine) Paulett Boros, MD as Medical Oncologist (Medical Oncology)  CHIEF COMPLAINT:   Diagnosis: right breast cancer    Cancer Staging  Breast cancer of upper-outer quadrant of right female breast [facility]) Staging form: Breast, AJCC 8th Edition - Clinical stage from 08/31/2021: Stage IIA (cT1c, cN1, cM0, G2, ER+, PR-, HER2-) - Unsigned    Prior Therapy: right breast mastectomy with lymph node biopsy on 07/29/2021   Current Therapy:  anastrozole    HISTORY OF PRESENT ILLNESS:   Oncology History  Breast cancer of upper-outer quadrant of right female breast (HCC)  08/31/2021 Initial Diagnosis   Breast cancer of upper-outer quadrant of right female breast (HCC)    Genetic Testing   Negative genetic testing. No pathogenic variants identified on the [facility] Multi-Cancer+RNA panel. VUS in [facility] called c.1022G>A identified. The report date is 10/24/2021.  The Multi-Cancer Panel + RNA offered by [facility] includes sequencing and/or deletion duplication testing of the following 84 genes: AIP, ALK, APC, ATM, AXIN2,BAP1,  BARD1, BLM, BMPR1A, BRCA1, BRCA2, BRIP1, CASR, CDC73, CDH1, CDK4, CDKN1B, CDKN1C, CDKN2A (p14ARF), CDKN2A (p16INK4a), CEBPA, CHEK2, CTNNA1, DICER1, DIS3L2, EGFR (c.2369C>T, p.Thr790Met variant only), EPCAM (Deletion/duplication testing only), FH, FLCN, GATA2, GPC3, GREM1 (Promoter region deletion/duplication testing only), HOXB13 (c.251G>A, p.Gly84Glu), HRAS, KIT, MAX, MEN1, MET, MITF (c.952G>A, p.Glu318Lys variant only), MLH1, MSH2, MSH3, MSH6, MUTYH, NBN, NF1, NF2, NTHL1, PALB2, PDGFRA, PHOX2B, PMS2, POLD1, POLE, POT1, PRKAR1A, PTCH1, PTEN, RAD50, RAD51C, RAD51D, RB1, RECQL4, RET, RUNX1, SDHAF2, SDHA (sequence changes only), SDHB, SDHC, SDHD, SMAD4, SMARCA4, SMARCB1, SMARCE1,  STK11, SUFU, TERC, TERT, TMEM127, TP53, TSC1, TSC2, VHL, WRN and WT1.      INTERVAL HISTORY:   Tearah is a 73 y.o. female presenting to clinic today for follow up of right breast cancer. She was last seen by me on 07/13/2023.  She was evaluated at urgent care on 11/25/2023 for urinary frequency, low back pain and confusion.  UA and urine culture were unrevealing but she was treated with Keflex  given history.  She was seen again on 12/28/2023 by her family physician and treated with Keflex  twice daily for dysuria.  Patient was seen by neurology in April for some memory concerns.  Lab work showed evidence of Alzheimer's disease biomarkers and she was started on Aricept .  SPEP IFE showed a M spike.  Looks like the plan is to repeat this in approximately 6 months.  Her husband passed away 1 month ago from seizures.  She states he was diagnosed with epilepsy when he was younger but had not had a seizure in many years.  She noticed that this was likely a cause of her memory problems and stressors.  He had been sick for a couple of months prior to his death.  She continues to tolerate Arimidex .  Hair loss has greatly improved since she started hair vitamins.   Today, she states that she is doing better each day.  Appetite is 100% energy levels are 75%.  She has chronic arthritis in her hands rates it as 7 out of 10.  Anxiety and depression has slowly improved since her husband passed.  Reports she is planning on moving close to [facility] where her daughter lives to a 50 and older community after the summertime.  [place] and her husband  took care of the outside of the house and she is unable to maintain.  Reports no new palpable mass or lumps or bumps.  No changes to her breast.  PAST MEDICAL HISTORY:   Past Medical History: Past Medical History:  Diagnosis Date   Anxiety    Arthritis    Depression    Diabetes mellitus    Family history of breast cancer    Family history of  colon cancer    Family history of prostate cancer    Family history of skin cancer    GERD (gastroesophageal reflux disease)    High blood pressure    Hypothyroid    Obesity    PONV (postoperative nausea and vomiting)     Surgical History: Past Surgical History:  Procedure Laterality Date   ABDOMINAL HYSTERECTOMY     Bunions Right    CARPAL TUNNEL RELEASE Right    KNEE ARTHROSCOPY Right    MASTECTOMY MODIFIED RADICAL Right 07/29/2021   invasive ductal ca   THYROID  SURGERY     removal   TOTAL KNEE ARTHROPLASTY Right 08/25/2013   Procedure: TOTAL KNEE ARTHROPLASTY;  Surgeon: Darrin Emerald, MD;  Location: [facility];  Service: Orthopedics;  Laterality: Right;   TOTAL KNEE ARTHROPLASTY Left 09/09/2014   Procedure: LEFT TOTAL KNEE ARTHROPLASTY;  Surgeon: Darrin Emerald, MD;  Location: [facility];  Service: Orthopedics;  Laterality: Left;    Social History: Social History   Socioeconomic History   Marital status: Married    Spouse name: Not on file   Number of children: 5   Years of education: Not on file   Highest education level: Not on file  Occupational History   Occupation: CNA    Employer: [employer]  Tobacco Use   Smoking status: Never   Smokeless tobacco: Never  Vaping Use   Vaping status: Never Used  Substance and Sexual Activity   Alcohol use: No   Drug use: No   Sexual activity: Yes    Birth control/protection: Surgical  Other Topics Concern   Not on file  Social History Narrative   Pt lives with husband Joe and 1 dog.   Social Drivers of Corporate investment banker Strain: Low Risk  (02/16/2023)   Overall Financial Resource Strain (CARDIA)    Difficulty of Paying Living Expenses: Not hard at all  Food Insecurity: No Food Insecurity (03/29/2023)   Hunger Vital Sign    Worried About Running Out of Food in the Last Year: Never true    Ran Out of Food in the Last Year: Never true  Transportation Needs: No Transportation Needs (03/29/2023)   PRAPARE -  Administrator, Civil Service (Medical): No    Lack of Transportation (Non-Medical): No  Physical Activity: Insufficiently Active (02/16/2023)   Exercise Vital Sign    Days of Exercise per Week: 2 days    Minutes of Exercise per Session: 30 min  Stress: No Stress Concern Present (02/16/2023)   [employer] of Occupational Health - Occupational Stress Questionnaire    Feeling of Stress : Not at all  Social Connections: Socially Integrated (02/16/2023)   Social Connection and Isolation Panel [NHANES]    Frequency of Communication with Friends and Family: More than three times a week    Frequency of Social Gatherings with Friends and Family: More than three times a week    Attends Religious Services: More than 4 times per year    Active Member of [employer] or Organizations:  Yes    Attends Club or Organization Meetings: More than 4 times per year    Marital Status: Married  Catering manager Violence: Not At Risk (03/19/2023)   Humiliation, Afraid, Rape, and Kick questionnaire    Fear of Current or Ex-Partner: No    Emotionally Abused: No    Physically Abused: No    Sexually Abused: No    Family History: Family History  Problem Relation Age of Onset   Diabetes Mother    Hypertension Mother    Congestive Heart Failure Father    Hypertension Father    Skin cancer Father 85       squamous cell on lip; metastatic   Hypertension Brother    Skin cancer Brother        squamous cell   Breast cancer Paternal Aunt        dx 58s d. 82s   Prostate cancer Paternal Uncle        dx 48s   Colon cancer Maternal Grandmother        dx 53s d. 35s   Diabetes Other    Cancer Cousin        unk type; possibly pancreatic    Current Medications:  Current Outpatient Medications:    anastrozole  (ARIMIDEX ) 1 MG tablet, Take 1 tablet (1 mg total) by mouth daily., Disp: 90 tablet, Rfl: 3   cephALEXin  (KEFLEX ) 500 MG capsule, Take 1 capsule (500 mg total) by mouth 2 (two) times daily., Disp:  14 capsule, Rfl: 0   donepezil  (ARICEPT ) 5 MG tablet, Take 1 tablet (5 mg total) by mouth at bedtime., Disp: 90 tablet, Rfl: 3   famotidine  (PEPCID ) 20 MG tablet, TAKE 1 TABLET AT BEDTIME, Disp: 90 tablet, Rfl: 0   gabapentin  (NEURONTIN ) 100 MG capsule, Take 1 capsule (100 mg total) by mouth 3 (three) times daily., Disp: 270 capsule, Rfl: 0   glipiZIDE  (GLUCOTROL  XL) 5 MG 24 hr tablet, Take 1 tablet (5 mg total) by mouth daily with breakfast., Disp: 90 tablet, Rfl: 3   hydrochlorothiazide  (HYDRODIURIL ) 25 MG tablet, Take 1 tablet (25 mg total) by mouth daily., Disp: 90 tablet, Rfl: 3   insulin  degludec (TRESIBA  FLEXTOUCH) 100 UNIT/ML FlexTouch Pen, Inject 34 Units into the skin at bedtime., Disp: 36 mL, Rfl: 3   Insulin  Glargine (BASAGLAR  KWIKPEN) 100 UNIT/ML, Inject 34 Units into the skin at bedtime., Disp: 45 mL, Rfl: 0   levothyroxine  (SYNTHROID ) 125 MCG tablet, TAKE ONE TABLET BY MOUTH ONCE DAILY BEFORE BREAKFAST, Disp: 90 tablet, Rfl: 0   losartan  (COZAAR ) 100 MG tablet, Take 1 tablet (100 mg total) by mouth every morning., Disp: 90 tablet, Rfl: 3   Magnesium  250 MG TABS, Take 250 mg by mouth daily., Disp: , Rfl:    metFORMIN  (GLUCOPHAGE -XR) 500 MG 24 hr tablet, Take 1 tablet (500 mg total) by mouth 2 (two) times daily with a meal., Disp: 180 tablet, Rfl: 3   metoprolol  succinate (TOPROL -XL) 100 MG 24 hr tablet, TAKE 1 TABLET AT BEDTIME   WITH OR IMMEDIATELY        FOLLOWING A MEAL, Disp: 90 tablet, Rfl: 3   miconazole  (MICOTIN) 2 % powder, Apply topically as needed for itching., Disp: 70 g, Rfl: 0   omeprazole  (PRILOSEC) 20 MG capsule, Take 1 capsule (20 mg total) by mouth at bedtime., Disp: 90 capsule, Rfl: 3   ULTICARE MINI PEN NEEDLES 31G X 6 MM MISC, daily. as directed, Disp: , Rfl:    venlafaxine  XR (EFFEXOR -XR)  75 MG 24 hr capsule, TAKE 1 CAPSULE BY MOUTH ONCE DAILY WITH BREAKFAST, Disp: 90 capsule, Rfl: 3   Allergies: Allergies  Allergen Reactions   Bactrim   [Sulfamethoxazole -Trimethoprim ] Other (See Comments)    Was told during hospital admission by providers to not ever take this again as it made her worse per patient   Ace Inhibitors Cough    REVIEW OF SYSTEMS:   Review of Systems  Constitutional:  Positive for fatigue.  Psychiatric/Behavioral:  Positive for confusion and depression. The patient is nervous/anxious.      VITALS:   There were no vitals taken for this visit.  Wt Readings from Last 3 Encounters:  12/28/23 179 lb 12.8 oz (81.6 kg)  12/14/23 185 lb (83.9 kg)  12/05/23 186 lb 8 oz (84.6 kg)    There is no height or weight on file to calculate BMI.  Performance status (ECOG): 1 - Symptomatic but completely ambulatory  PHYSICAL EXAM:   Physical Exam Constitutional:      Appearance: Normal appearance.  HENT:     Head: Normocephalic and atraumatic.  Eyes:     Pupils: Pupils are equal, round, and reactive to light.  Cardiovascular:     Rate and Rhythm: Normal rate and regular rhythm.     Heart sounds: Normal heart sounds. No murmur heard. Pulmonary:     Effort: Pulmonary effort is normal.     Breath sounds: Normal breath sounds. No wheezing.  Abdominal:     General: Bowel sounds are normal. There is no distension.     Palpations: Abdomen is soft.     Tenderness: There is no abdominal tenderness.  Musculoskeletal:        General: Normal range of motion.     Cervical back: Normal range of motion.  Skin:    General: Skin is warm and dry.     Findings: No rash.  Neurological:     Mental Status: She is alert and oriented to person, place, and time.  Psychiatric:        Judgment: Judgment normal.     LABS:      Latest Ref Rng & Units 12/31/2023   10:12 AM 11/27/2023   10:19 AM 07/06/2023   11:05 AM  CBC  WBC 4.0 - 10.5 K/uL 7.2  6.4  7.2   Hemoglobin 12.0 - 15.0 g/dL 96.0  45.4  09.8   Hematocrit 36.0 - 46.0 % 35.1  36.1  36.1   Platelets 150 - 400 K/uL 294  284  291       Latest Ref Rng & Units 12/31/2023    10:12 AM 12/05/2023   11:41 AM 11/27/2023   10:19 AM  CMP  Glucose 70 - 99 mg/dL 119   147   BUN 8 - 23 mg/dL 23   24   Creatinine 8.29 - 1.00 mg/dL 5.62   1.30   Sodium 865 - 145 mmol/L 137   142   Potassium 3.5 - 5.1 mmol/L 4.1   4.4   Chloride 98 - 111 mmol/L 100   102   CO2 22 - 32 mmol/L 27   24   Calcium 8.9 - 10.3 mg/dL 9.8   9.9   Total Protein 6.5 - 8.1 g/dL 7.8  7.8  7.8   Total Bilirubin 0.0 - 1.2 mg/dL 0.8   0.6   Alkaline Phos 38 - 126 U/L 69   85   AST 15 - 41 U/L 39   20   ALT 0 -  44 U/L 40   18      No results found for: "CEA1", "CEA" / No results found for: "CEA1", "CEA" No results found for: "PSA1" No results found for: "GMW102" No results found for: "CAN125"  No results found for: "TOTALPROTELP", "ALBUMINELP", "A1GS", "A2GS", "BETS", "BETA2SER", "GAMS", "MSPIKE", "SPEI" Lab Results  Component Value Date   TIBC 371 11/27/2023   TIBC 340 04/09/2023   TIBC 365 09/05/2021   FERRITIN 51 11/27/2023   FERRITIN 271 (H) 04/09/2023   FERRITIN 73 09/05/2021   IRONPCTSAT 37 11/27/2023   IRONPCTSAT 20 04/09/2023   IRONPCTSAT 20 09/05/2021   No results found for: "LDH"   STUDIES:   MM 3D SCREENING MAMMOGRAM UNILATERAL LEFT BREAST Result Date: 01/02/2024 CLINICAL DATA:  Screening. EXAM: DIGITAL SCREENING UNILATERAL LEFT MAMMOGRAM WITH CAD AND TOMOSYNTHESIS TECHNIQUE: Left screening digital craniocaudal and mediolateral oblique mammograms were obtained. Left screening digital breast tomosynthesis was performed. The images were evaluated with computer-aided detection. COMPARISON:  Previous exam(s). ACR Breast Density Category b: There are scattered areas of fibroglandular density. FINDINGS: There are no findings suspicious for malignancy. IMPRESSION: No mammographic evidence of malignancy. A result letter of this screening mammogram will be mailed directly to the patient. RECOMMENDATION: Screening mammogram in one year. (Code:SM-B-01Y) BI-RADS CATEGORY  1: Negative.  Electronically Signed   By: Roda Cirri M.D.   On: 01/02/2024 09:14       ASSESSMENT & PLAN:   Assessment: 1. Stage IIa (T1CN1) right breast upper outer quadrant IDC: - Abnormal mammogram on 06/02/2021. - Right breast 9:00 mass biopsy on 06/30/2021, invasive ductal carcinoma, ER 70% positive, PR negative, Ki-67 2%, HER2 1+.  Right axillary lymph node biopsy was positive for metastatic carcinoma. - She met with Dr. Orin Birk for possible reconstruction but decided against it. - She underwent right breast mastectomy with lymph node biopsy on 07/29/2021. - Pathology showed 1.1 cm grade 2 IDC, associated DCIS, margins negative.  Metastatic carcinoma involving 1/9 lymph nodes.  PT1CPN1A. - Oncotype DX recurrence score 22.  Anastrozole  started in February 2023.   2. Social/family history: - She lives at home with her husband.  Today she is seen with her daughter. 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  Non-smoker. - Father had squamous cell carcinoma of the lip which has metastasized.  Maternal grandmother had colon cancer.  Paternal uncle had prostate cancer and paternal aunt had breast cancer.    Plan: 1. Stage IIa (T1CN1) right breast IDC, ER positive, PR/HER2 negative: - She is tolerating anastrozole  very well.  Minor hot flashes and arthralgias are stable. Continue anastrozole  therapy.  - Screening mammogram of the left breast on 12/31/2023 was BI-RADS Category 1 negative. Next mammogram due in one year around May 2026.  -Recommend labs in 6 months.  2.  Bone health (DEXA scan 09/12/2021 T score -0.5): - She is not on calcium or vitamin D  supplements.   -Vitamin D  from 12/31/23 was 45.25.  Calcium normal at 9.8. -Recommend calcium and vitamin D  supplements. -Recommend repeat bone density ASAP. Orders placed.   3.  Recurrent UTIs: - Reports she has been on several antibiotics for UTIs over the last few months.  Most recently was on 12/28/2023 where she received  Keflex  500 mg twice daily.  Urine culture was unrevealing.  4.  Alopecia: - Improved dramatically from her last visit. She is currently on vitamin along with Centrum.  5.  Anemia: -Hemoglobin has been between 11 and 12 over  the past year with a significant dip while hospitalized last summer. -This has improved/almost normalized.  Hemoglobin from 12/31/23 was 11.6. -Will continue to monitor.  6.  MGUS: -Monoclonal protein discovered during workup for memory loss. -Neurology on 12/05/2023 drew a protein electrophoresis which showed an M protein of 0.1-IgG monoclonal protein with kappa light chain specificity. -Recommend UPEP, metastatic bone survey and repeat labs in 6 months. -No Crab criteria.  Calcium level 1.8, mild anemia stable and CKD.  Creatinine 0.95.    PLAN SUMMARY: >> Bone density ASAP. Continue Anastrozole .  >> Recommend bone survey and UPEP for MGUS.  >> Mammogram in May 2026. >> RTC  in 6 months with labs a few days before and office visit.      Patient expressed understanding of the plan provided.   I spent 25 minutes dedicated to the care of this patient (face-to-face and non-face-to-face) on the date of the encounter to include what is described in the assessment and plan.  Charlton Cooler, NP 01/17/2024 11:03 AM

## 2024-01-23 ENCOUNTER — Other Ambulatory Visit: Payer: Self-pay

## 2024-01-23 ENCOUNTER — Ambulatory Visit (HOSPITAL_COMMUNITY)
Admission: RE | Admit: 2024-01-23 | Discharge: 2024-01-23 | Disposition: A | Discharge status: Home / Self Care | Source: Ambulatory Visit | Attending: Oncology | Admitting: Oncology

## 2024-01-23 DIAGNOSIS — Z780 Asymptomatic menopausal state: Secondary | ICD-10-CM | POA: Diagnosis not present

## 2024-01-23 DIAGNOSIS — Z9011 Acquired absence of right breast and nipple: Secondary | ICD-10-CM | POA: Diagnosis not present

## 2024-01-23 DIAGNOSIS — Z170 Estrogen receptor positive status [ER+]: Secondary | ICD-10-CM | POA: Diagnosis not present

## 2024-01-23 DIAGNOSIS — M85852 Other specified disorders of bone density and structure, left thigh: Secondary | ICD-10-CM | POA: Diagnosis not present

## 2024-01-23 DIAGNOSIS — D472 Monoclonal gammopathy: Secondary | ICD-10-CM

## 2024-01-23 DIAGNOSIS — C50411 Malignant neoplasm of upper-outer quadrant of right female breast: Secondary | ICD-10-CM | POA: Diagnosis not present

## 2024-01-23 DIAGNOSIS — Z79899 Other long term (current) drug therapy: Secondary | ICD-10-CM | POA: Diagnosis not present

## 2024-01-23 DIAGNOSIS — Z79811 Long term (current) use of aromatase inhibitors: Secondary | ICD-10-CM | POA: Diagnosis not present

## 2024-01-23 DIAGNOSIS — Z13820 Encounter for screening for osteoporosis: Secondary | ICD-10-CM | POA: Insufficient documentation

## 2024-01-23 DIAGNOSIS — G309 Alzheimer's disease, unspecified: Secondary | ICD-10-CM | POA: Diagnosis not present

## 2024-01-24 ENCOUNTER — Ambulatory Visit: Payer: Self-pay | Admitting: Oncology

## 2024-01-25 LAB — UPEP/UIFE/LIGHT CHAINS/TP, 24-HR UR
% BETA, Urine: 15.7 %
ALPHA 1 URINE: 3.7 %
Albumin, U: 59.7 %
Alpha 2, Urine: 8.2 %
Free Kappa Lt Chains,Ur: 16.52 mg/L (ref 1.17–86.46)
Free Kappa/Lambda Ratio: 5.11 (ref 1.83–14.26)
Free Lambda Lt Chains,Ur: 3.23 mg/L (ref 0.27–15.21)
GAMMA GLOBULIN URINE: 12.7 %
Total Protein, Urine-Ur/day: 172 mg/(24.h) — ABNORMAL HIGH (ref 30–150)
Total Protein, Urine: 11.1 mg/dL
Total Volume: 1550

## 2024-01-28 ENCOUNTER — Ambulatory Visit: Payer: Self-pay | Admitting: Oncology

## 2024-01-30 ENCOUNTER — Ambulatory Visit: Admitting: Family Medicine

## 2024-02-05 ENCOUNTER — Ambulatory Visit: Payer: Medicare Other | Admitting: Nurse Practitioner

## 2024-02-05 DIAGNOSIS — H524 Presbyopia: Secondary | ICD-10-CM | POA: Diagnosis not present

## 2024-02-05 DIAGNOSIS — E119 Type 2 diabetes mellitus without complications: Secondary | ICD-10-CM | POA: Diagnosis not present

## 2024-02-05 LAB — HM DIABETES EYE EXAM

## 2024-02-22 ENCOUNTER — Ambulatory Visit (INDEPENDENT_AMBULATORY_CARE_PROVIDER_SITE_OTHER): Payer: Medicare Other

## 2024-02-22 VITALS — Ht 64.0 in | Wt 185.0 lb

## 2024-02-22 DIAGNOSIS — Z0000 Encounter for general adult medical examination without abnormal findings: Secondary | ICD-10-CM | POA: Diagnosis not present

## 2024-02-22 NOTE — Patient Instructions (Signed)
 Kimberly [name] , Thank you for taking time out of your busy schedule to complete your Annual Wellness Visit with me. I enjoyed our conversation and look forward to speaking with you again next year. I, as well as your care team,  appreciate your ongoing commitment to your health goals. Please review the following plan we discussed and let me know if I can assist you in the future. Your Game plan/ To Do List    Follow up Visits: Next Medicare AWV with our clinical staff: In 1 year    Have you seen your provider in the last 6 months (3 months if uncontrolled diabetes)? Yes Next Office Visit with your provider: 03/12/24 @ 10:20  Clinician Recommendations:  Aim for 30 minutes of exercise or brisk walking, 6-8 glasses of water, and 5 servings of fruits and vegetables each day.       This is a list of the screening recommended for you and due dates:  Health Maintenance  Topic Date Due   COVID-19 Vaccine (4 - 2024-25 season) 04/29/2023   Eye exam for diabetics  06/01/2023   Complete foot exam   12/25/2023   Zoster (Shingles) Vaccine (1 of 2) 02/26/2024*   DTaP/Tdap/Td vaccine (1 - Tdap) 11/26/2024*   Flu Shot  03/28/2024   Hemoglobin A1C  04/06/2024   Yearly kidney health urinalysis for diabetes  10/30/2024   Yearly kidney function blood test for diabetes  12/30/2024   Medicare Annual Wellness Visit  02/21/2025   Mammogram  12/30/2025   Colon Cancer Screening  08/17/2027   Pneumococcal Vaccine for age over 33  Completed   DEXA scan (bone density measurement)  Completed   Hepatitis C Screening  Completed   Hepatitis B Vaccine  Aged Out   HPV Vaccine  Aged Out   Meningitis B Vaccine  Aged Out  *Topic was postponed. The date shown is not the original due date.    Advanced directives: (ACP Link)Information on Advanced Care Planning can be found at [place]  Secretary of [facility] Advance Health Care Directives Advance Health Care Directives. [email or web address]   Advance Care Planning is important  because it:  [x]  Makes sure you receive the medical care that is consistent with your values, goals, and preferences  [x]  It provides guidance to your family and loved ones and reduces their decisional burden about whether or not they are making the right decisions based on your wishes.  Follow the link provided in your after visit summary or read over the paperwork we have mailed to you to help you started getting your Advance Directives in place. If you need assistance in completing these, please reach out to us  so that we can help you!  See attachments for Preventive Care and Fall Prevention Tips.

## 2024-02-22 NOTE — Progress Notes (Signed)
 Subjective:   Kimberly [name] is a 73 y.o. who presents for a Medicare Wellness preventive visit.  As a reminder, Annual Wellness Visits don't include a physical exam, and some assessments may be limited, especially if this visit is performed virtually. We may recommend an in-person follow-up visit with your provider if needed.  Visit Complete: Virtual I connected with  Heron DELENA Idol on 02/22/24 by a audio enabled telemedicine application and verified that I am speaking with the correct person using two identifiers.  Patient Location: Home  Provider Location: Home Office  I discussed the limitations of evaluation and management by telemedicine. The patient expressed understanding and agreed to proceed.  Vital Signs: Because this visit was a virtual/telehealth visit, some criteria may be missing or patient reported. Any vitals not documented were not able to be obtained and vitals that have been documented are patient reported.  VideoDeclined- This patient declined Librarian, academic. Therefore the visit was completed with audio only.  Persons Participating in Visit: Patient.  AWV Questionnaire: No: Patient Medicare AWV questionnaire was not completed prior to this visit.  Cardiac Risk Factors include: advanced age (>75men, >45 women);diabetes mellitus;dyslipidemia;hypertension     Objective:    Today's Vitals   02/22/24 1314  Weight: 185 lb (83.9 kg)  Height: 5' 4 (1.626 m)   Body mass index is 31.76 kg/m.     02/22/2024    1:19 PM 01/17/2024   11:21 AM 07/13/2023    9:37 AM 03/19/2023   10:00 AM 02/16/2023    1:09 PM 01/16/2023   11:44 PM 01/16/2023    2:44 PM  Advanced Directives  Does Patient Have a Medical Advance Directive? No Yes Yes No No  Yes  Type of Advance Directive  Living will;Healthcare Power of [employer] Power of [place];Living will   Living will Living will  Does patient want to make changes to medical advance  directive?  No - Patient declined No - Patient declined      Copy of Healthcare Power of Attorney in Chart?  No - copy requested No - copy requested      Would patient like information on creating a medical advance directive? Yes (MAU/Ambulatory/Procedural Areas - Information given)  No - Patient declined No - Patient declined No - Patient declined      Current Medications (verified) Outpatient Encounter Medications as of 02/22/2024  Medication Sig   anastrozole  (ARIMIDEX ) 1 MG tablet Take 1 tablet (1 mg total) by mouth daily.   donepezil  (ARICEPT ) 5 MG tablet Take 1 tablet (5 mg total) by mouth at bedtime.   famotidine  (PEPCID ) 20 MG tablet TAKE 1 TABLET AT BEDTIME   gabapentin  (NEURONTIN ) 100 MG capsule Take 1 capsule (100 mg total) by mouth 3 (three) times daily.   glipiZIDE  (GLUCOTROL  XL) 5 MG 24 hr tablet Take 1 tablet (5 mg total) by mouth daily with breakfast.   hydrochlorothiazide  (HYDRODIURIL ) 25 MG tablet Take 1 tablet (25 mg total) by mouth daily.   insulin  degludec (TRESIBA  FLEXTOUCH) 100 UNIT/ML FlexTouch Pen Inject 34 Units into the skin at bedtime.   Insulin  Glargine (BASAGLAR  KWIKPEN) 100 UNIT/ML Inject 34 Units into the skin at bedtime.   levothyroxine  (SYNTHROID ) 125 MCG tablet TAKE ONE TABLET BY MOUTH ONCE DAILY BEFORE BREAKFAST   losartan  (COZAAR ) 100 MG tablet Take 1 tablet (100 mg total) by mouth every morning.   Magnesium  250 MG TABS Take 250 mg by mouth daily.   metFORMIN  (GLUCOPHAGE -XR) 500 MG  24 hr tablet Take 1 tablet (500 mg total) by mouth 2 (two) times daily with a meal.   metoprolol  succinate (TOPROL -XL) 100 MG 24 hr tablet TAKE 1 TABLET AT BEDTIME   WITH OR IMMEDIATELY        FOLLOWING A MEAL   miconazole  (MICOTIN) 2 % powder Apply topically as needed for itching.   omeprazole  (PRILOSEC) 20 MG capsule Take 1 capsule (20 mg total) by mouth at bedtime.   ULTICARE MINI PEN NEEDLES 31G X 6 MM MISC daily. as directed   venlafaxine  XR (EFFEXOR -XR) 75 MG 24 hr capsule  TAKE 1 CAPSULE BY MOUTH ONCE DAILY WITH BREAKFAST   cephALEXin  (KEFLEX ) 500 MG capsule Take 1 capsule (500 mg total) by mouth 2 (two) times daily.   No facility-administered encounter medications on file as of 02/22/2024.    Allergies (verified) Bactrim  [sulfamethoxazole -trimethoprim ] and Ace inhibitors   History: Past Medical History:  Diagnosis Date   Anxiety    Arthritis    Breast cancer (HCC) 07/2021   Depression    Diabetes mellitus    Family history of breast cancer    Family history of colon cancer    Family history of prostate cancer    Family history of skin cancer    GERD (gastroesophageal reflux disease)    High blood pressure    Hypothyroid    Obesity    PONV (postoperative nausea and vomiting)    Past Surgical History:  Procedure Laterality Date   ABDOMINAL HYSTERECTOMY     BREAST SURGERY  12/3//2022   Bunions Right    CARPAL TUNNEL RELEASE Right    JOINT REPLACEMENT     KNEE ARTHROSCOPY Right    MASTECTOMY MODIFIED RADICAL Right 07/29/2021   invasive ductal ca   THYROID  SURGERY     removal   TOTAL KNEE ARTHROPLASTY Right 08/25/2013   Procedure: TOTAL KNEE ARTHROPLASTY;  Surgeon: Taft FORBES Minerva, MD;  Location: [facility];  Service: Orthopedics;  Laterality: Right;   TOTAL KNEE ARTHROPLASTY Left 09/09/2014   Procedure: LEFT TOTAL KNEE ARTHROPLASTY;  Surgeon: Taft FORBES Minerva, MD;  Location: [facility];  Service: Orthopedics;  Laterality: Left;   Family History  Problem Relation Age of Onset   Diabetes Mother    Hypertension Mother    Arthritis Mother    Congestive Heart Failure Father    Hypertension Father    Skin cancer Father 18       squamous cell on lip; metastatic   Cancer Father    Hearing loss Father    Heart disease Father    Hypertension Brother    Skin cancer Brother        squamous cell   Breast cancer Paternal Aunt        dx 13s d. 41s   Prostate cancer Paternal Uncle        dx 72s   Colon cancer Maternal Grandmother        dx 47s  d. 27s   Diabetes Other    Cancer Cousin        unk type; possibly pancreatic   Social History   Socioeconomic History   Marital status: Widowed    Spouse name: Not on file   Number of children: 5   Years of education: Not on file   Highest education level: Not on file  Occupational History   Occupation: CNA    Employer: [employer]  Tobacco Use   Smoking status: Never   Smokeless tobacco: Never  Vaping Use   Vaping status: Never Used  Substance and Sexual Activity   Alcohol use: No   Drug use: No   Sexual activity: Yes    Birth control/protection: Surgical  Other Topics Concern   Not on file  Social History Narrative   Pt lives with husband Joe and 1 dog. (Recently widowed)    Social Drivers of Corporate investment banker Strain: Low Risk  (02/22/2024)   Overall Financial Resource Strain (CARDIA)    Difficulty of Paying Living Expenses: Not hard at all  Food Insecurity: No Food Insecurity (02/22/2024)   Hunger Vital Sign    Worried About Running Out of Food in the Last Year: Never true    Ran Out of Food in the Last Year: Never true  Transportation Needs: No Transportation Needs (02/22/2024)   PRAPARE - Administrator, Civil Service (Medical): No    Lack of Transportation (Non-Medical): No  Physical Activity: Insufficiently Active (02/22/2024)   Exercise Vital Sign    Days of Exercise per Week: 3 days    Minutes of Exercise per Session: 30 min  Stress: No Stress Concern Present (02/22/2024)   [employer] of Occupational Health - Occupational Stress Questionnaire    Feeling of Stress: Only a little  Social Connections: Moderately Integrated (02/22/2024)   Social Connection and Isolation Panel    Frequency of Communication with Friends and Family: More than three times a week    Frequency of Social Gatherings with Friends and Family: More than three times a week    Attends Religious Services: More than 4 times per year    Active Member of [employer] or  Organizations: Yes    Attends Banker Meetings: More than 4 times per year    Marital Status: Widowed    Tobacco Counseling Counseling given: Not Answered    Clinical Intake:  Pre-visit preparation completed: Yes  Pain : No/denies pain     Diabetes: Yes CBG done?: No Did pt. bring in CBG monitor from home?: No  Lab Results  Component Value Date   HGBA1C 7.1 (A) 10/08/2023   HGBA1C 6.4 (H) 03/19/2023   HGBA1C 6.9 (A) 12/25/2022     How often do you need to have someone help you when you read instructions, pamphlets, or other written materials from your doctor or pharmacy?: 1 - Never  Interpreter Needed?: No  Information entered by :: Charmaine Bloodgood LPN   Activities of Daily Living     02/22/2024    1:19 PM 03/19/2023    4:15 PM  In your present state of health, do you have any difficulty performing the following activities:  Hearing? 0 0  Vision? 0 0  Difficulty concentrating or making decisions? 0 0  Walking or climbing stairs? 0 0  Dressing or bathing? 0 0  Doing errands, shopping? 0 0  Preparing Food and eating ? N   Using the Toilet? N   In the past six months, have you accidently leaked urine? N   Do you have problems with loss of bowel control? N   Managing your Medications? N   Managing your Finances? N   Housekeeping or managing your Housekeeping? N     Patient Care Team: Cook, [name], DO as PCP - General (Family Medicine) Rogers Hai, MD as Medical Oncologist (Medical Oncology) Patty, A. Robynn, MD (Ophthalmology) Vear, Charlie LABOR, MD (Neurology) Margrette Taft BRAVO, MD as Consulting Physician (Orthopedic Surgery)  I have updated your Care  Teams any recent Medical Services you may have received from other providers in the past year.     Assessment:   This is a routine wellness examination for [place].  Hearing/Vision screen Hearing Screening - Comments:: Denies hearing difficulties   Vision Screening - Comments::  Wears rx glasses - up to date with routine eye exams with [facility])    Goals Addressed             This Visit's Progress    Exercise 3x per week (30 min per time)   On track    Continue to exercise and stay healthy.        Depression Screen     02/22/2024    1:17 PM 12/14/2023   10:35 AM 11/27/2023    9:09 AM 10/31/2023    9:46 AM 05/03/2023    2:51 PM 05/02/2023    9:45 AM 04/09/2023    2:44 PM  PHQ 2/9 Scores  PHQ - 2 Score 4 5 1 5  0 4 0  PHQ- 9 Score 14 22 9 10  13 4     Fall Risk     02/22/2024    1:19 PM 12/14/2023   10:36 AM 11/27/2023    9:09 AM 10/31/2023    9:46 AM 05/03/2023    2:51 PM  Fall Risk   Falls in the past year? 0 1 1 1 1   Number falls in past yr: 0 1 1 0 0  Injury with Fall? 0 0 0 0   Risk for fall due to : No Fall Risks History of fall(s)  History of fall(s) History of fall(s)  Follow up Falls prevention discussed;Education provided;Falls evaluation completed Falls evaluation completed  Falls evaluation completed Falls evaluation completed    MEDICARE RISK AT HOME:  Medicare Risk at Home Any stairs in or around the home?: No If so, are there any without handrails?: No Home free of loose throw rugs in walkways, pet beds, electrical cords, etc?: Yes Adequate lighting in your home to reduce risk of falls?: Yes Life alert?: No Use of a cane, walker or w/c?: No Grab bars in the bathroom?: Yes Shower chair or bench in shower?: No Elevated toilet seat or a handicapped toilet?: Yes  TIMED UP AND GO:  Was the test performed?  No  Cognitive Function: Impaired: Patient has current diagnosis of cognitive impairment.      12/05/2023   10:51 AM  Montreal Cognitive Assessment   Visuospatial/ Executive (0/5) 3  Naming (0/3) 3  Attention: Read list of digits (0/2) 2  Attention: Read list of letters (0/1) 1  Attention: Serial 7 subtraction starting at 100 (0/3) 1  Language: Repeat phrase (0/2) 2  Language : Fluency (0/1) 0  Abstraction (0/2) 1   Delayed Recall (0/5) 1  Orientation (0/6) 5  Total 19      02/16/2023    1:11 PM 02/07/2022    3:40 PM  6CIT Screen  What Year? 0 points 0 points  What month? 0 points 0 points  What time? 0 points 0 points  Count back from 20 0 points 0 points  Months in reverse 0 points 0 points  Repeat phrase 2 points 0 points  Total Score 2 points 0 points    Immunizations Immunization History  Administered Date(s) Administered   Fluad Quad(high Dose 65+) 07/12/2020   Fluad Trivalent(High Dose 65+) 05/02/2023   Influenza,inj,Quad PF,6+ Mos 04/30/2018, 08/24/2022   Influenza-Unspecified 05/21/2016, 05/03/2017, 06/21/2019   Moderna Sars-Covid-2  Vaccination 10/07/2019, 11/05/2019, 08/02/2020   PNEUMOCOCCAL CONJUGATE-20 10/31/2023   Pneumococcal Conjugate-13 03/03/2014, 05/30/2017   Pneumococcal Polysaccharide-23 05/27/2010    Screening Tests Health Maintenance  Topic Date Due   COVID-19 Vaccine (4 - 2024-25 season) 04/29/2023   OPHTHALMOLOGY EXAM  06/01/2023   FOOT EXAM  12/25/2023   Zoster Vaccines- Shingrix (1 of 2) 02/26/2024 (Originally 11/22/1969)   DTaP/Tdap/Td (1 - Tdap) 11/26/2024 (Originally 11/22/1969)   INFLUENZA VACCINE  03/28/2024   HEMOGLOBIN A1C  04/06/2024   Diabetic kidney evaluation - Urine ACR  10/30/2024   Diabetic kidney evaluation - eGFR measurement  12/30/2024   Medicare Annual Wellness (AWV)  02/21/2025   MAMMOGRAM  12/30/2025   Colonoscopy  08/17/2027   Pneumococcal Vaccine: 50+ Years  Completed   DEXA SCAN  Completed   Hepatitis C Screening  Completed   Hepatitis B Vaccines  Aged Out   HPV VACCINES  Aged Out   Meningococcal B Vaccine  Aged Out    Health Maintenance  Health Maintenance Due  Topic Date Due   COVID-19 Vaccine (4 - 2024-25 season) 04/29/2023   OPHTHALMOLOGY EXAM  06/01/2023   FOOT EXAM  12/25/2023   Health Maintenance Items Addressed: Requesting records from last diabetic eye exam  Additional Screening:  Vision Screening:  Recommended annual ophthalmology exams for early detection of glaucoma and other disorders of the eye. Would you like a referral to an eye doctor? No    Dental Screening: Recommended annual dental exams for proper oral hygiene  Community Resource Referral / Chronic Care Management: CRR required this visit?  No   CCM required this visit?  No   Plan:    I have personally reviewed and noted the following in the patient's chart:   Medical and social history Use of alcohol, tobacco or illicit drugs  Current medications and supplements including opioid prescriptions. Patient is not currently taking opioid prescriptions. Functional ability and status Nutritional status Physical activity Advanced directives List of other physicians Hospitalizations, surgeries, and ER visits in previous 12 months Vitals Screenings to include cognitive, depression, and falls Referrals and appointments  In addition, I have reviewed and discussed with patient certain preventive protocols, quality metrics, and best practice recommendations. A written personalized care plan for preventive services as well as general preventive health recommendations were provided to patient.   Lavelle Pfeiffer [place], [place]   3/[birth year]74   After Visit Summary: (MyChart) Due to this being a telephonic visit, the after visit summary with patients personalized plan was offered to patient via MyChart   Notes: Nothing significant to report at this time.

## 2024-02-27 ENCOUNTER — Other Ambulatory Visit: Payer: Self-pay | Admitting: Nurse Practitioner

## 2024-03-10 ENCOUNTER — Other Ambulatory Visit: Payer: Self-pay | Admitting: Family Medicine

## 2024-03-12 ENCOUNTER — Ambulatory Visit: Admitting: Family Medicine

## 2024-03-12 ENCOUNTER — Encounter: Payer: Self-pay | Admitting: Family Medicine

## 2024-03-12 VITALS — BP 124/64 | HR 60 | Temp 97.3°F | Ht 64.0 in | Wt 181.0 lb

## 2024-03-12 DIAGNOSIS — N1831 Chronic kidney disease, stage 3a: Secondary | ICD-10-CM | POA: Diagnosis not present

## 2024-03-12 DIAGNOSIS — E1141 Type 2 diabetes mellitus with diabetic mononeuropathy: Secondary | ICD-10-CM | POA: Diagnosis not present

## 2024-03-12 DIAGNOSIS — I10 Essential (primary) hypertension: Secondary | ICD-10-CM | POA: Diagnosis not present

## 2024-03-12 DIAGNOSIS — E1140 Type 2 diabetes mellitus with diabetic neuropathy, unspecified: Secondary | ICD-10-CM | POA: Insufficient documentation

## 2024-03-12 DIAGNOSIS — E1122 Type 2 diabetes mellitus with diabetic chronic kidney disease: Secondary | ICD-10-CM | POA: Diagnosis not present

## 2024-03-12 MED ORDER — GABAPENTIN 100 MG PO CAPS: 100.0000 mg | ORAL_CAPSULE | Freq: Two times a day (BID) | ORAL | 3 refills | Status: AC

## 2024-03-12 MED ORDER — GABAPENTIN 300 MG PO CAPS
300.0000 mg | ORAL_CAPSULE | Freq: Every day | ORAL | 3 refills | Status: AC
Start: 2024-03-12 — End: ?

## 2024-03-12 NOTE — Assessment & Plan Note (Signed)
 Has upcoming follow-up with endocrinology.

## 2024-03-12 NOTE — Patient Instructions (Signed)
 Gabapentin  300 mg at night.  Follow up in 6 months.  Take care  Dr. Bluford

## 2024-03-12 NOTE — Assessment & Plan Note (Signed)
 Stable. Continue HCTZ, losartan , and metoprolol .

## 2024-03-12 NOTE — Progress Notes (Signed)
 Subjective:  Patient ID: Kimberly [name], female    DOB: [birth year]  Age: 73 y.o. MRN: [id number]  CC:   Chief Complaint  Patient presents with   3 month follow up     Left toe pain on and off 6 months - will schedule foot doctor appt    HPI:  73 year old female with the below mentioned medical problems presents for follow up.  Patient states overall she is doing well.  She states that she feels like she is doing better than she has in quite some time.  Doing okay following the death of her husband.  Blood pressure well-controlled.  Patient reports that she recently got scammed and lost a great deal of money but she is doing okay regarding this.  She reports that she is having significant pain of her left great toe particularly at night.  This has been going on for quite some time but has been worsening.  She describes it as a burning sensation.  Likely secondary to neuropathy.  She is currently on gabapentin .  Will discuss dose increase today.  Patient Active Problem List   Diagnosis Date Noted   Diabetic neuropathy (HCC) 03/12/2024   Alzheimer dementia (HCC) 12/16/2023   Hepatic steatosis 04/10/2023   Recurrent UTI 04/10/2023   Anemia 04/10/2023   Class 1 obesity 03/19/2023   Mixed hyperlipidemia 08/24/2022   Genetic testing 10/25/2021   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) 09/05/2021   Vitamin D  deficiency 09/05/2021   Breast cancer of upper-outer quadrant of right female breast (HCC) 08/31/2021   S/P mastectomy, right 07/29/2021   Urinary incontinence 05/07/2020   Depression 12/29/2015   S/P knee replacement 09/02/2013   Essential hypertension, benign 07/06/2013   Hypothyroidism 07/06/2013    Social Hx   Social History   Socioeconomic History   Marital status: Widowed    Spouse name: Not on file   Number of children: 5   Years of education: Not on file   Highest education level: Not on file  Occupational History   Occupation: CNA    Employer:  [employer]  Tobacco Use   Smoking status: Never   Smokeless tobacco: Never  Vaping Use   Vaping status: Never Used  Substance and Sexual Activity   Alcohol use: No   Drug use: No   Sexual activity: Yes    Birth control/protection: Surgical  Other Topics Concern   Not on file  Social History Narrative   Pt lives with husband Joe and 1 dog. (Recently widowed)    Social Drivers of Corporate investment banker Strain: Low Risk  (02/22/2024)   Overall Financial Resource Strain (CARDIA)    Difficulty of Paying Living Expenses: Not hard at all  Food Insecurity: No Food Insecurity (02/22/2024)   Hunger Vital Sign    Worried About Running Out of Food in the Last Year: Never true    Ran Out of Food in the Last Year: Never true  Transportation Needs: No Transportation Needs (02/22/2024)   PRAPARE - Administrator, Civil Service (Medical): No    Lack of Transportation (Non-Medical): No  Physical Activity: Insufficiently Active (02/22/2024)   Exercise Vital Sign    Days of Exercise per Week: 3 days    Minutes of Exercise per Session: 30 min  Stress: No Stress Concern Present (02/22/2024)   [employer] of Occupational Health - Occupational Stress Questionnaire    Feeling of Stress: Only a little  Social  Connections: Moderately Integrated (02/22/2024)   Social Connection and Isolation Panel    Frequency of Communication with Friends and Family: More than three times a week    Frequency of Social Gatherings with Friends and Family: More than three times a week    Attends Religious Services: More than 4 times per year    Active Member of [employer] or Organizations: Yes    Attends Banker Meetings: More than 4 times per year    Marital Status: Widowed    Review of Systems Per HPI  Objective:  BP 124/64   Pulse 60   Temp (!) 97.3 F (36.3 C)   Ht 5' 4 (1.626 m)   Wt 181 lb (82.1 kg)   SpO2 97%   BMI 31.07 kg/m      03/12/2024   10:11 AM 02/22/2024     1:14 PM 01/17/2024   11:14 AM  BP/Weight  Systolic BP 124 -- 152  Diastolic BP 64 -- 64  Wt. (Lbs) 181 185 185.19  BMI 31.07 kg/m2 31.76 kg/m2 31.79 kg/m2    Physical Exam Vitals and nursing note reviewed.  Constitutional:      General: She is not in acute distress.    Appearance: Normal appearance.  HENT:     Head: Normocephalic and atraumatic.  Eyes:     General:        Right eye: No discharge.        Left eye: No discharge.     Conjunctiva/sclera: Conjunctivae normal.  Cardiovascular:     Rate and Rhythm: Normal rate and regular rhythm.  Pulmonary:     Effort: Pulmonary effort is normal.     Breath sounds: Normal breath sounds.  Musculoskeletal:     Comments: Left great toe -no wounds or lesions.  Onychomycosis noted.  Neurological:     Mental Status: She is alert.     Lab Results  Component Value Date   WBC 7.2 12/31/2023   HGB 11.6 (L) 12/31/2023   HCT 35.1 (L) 12/31/2023   PLT 294 12/31/2023   GLUCOSE 121 (H) 12/31/2023   CHOL 163 10/02/2023   TRIG 213 (H) 10/02/2023   HDL 40 10/02/2023   LDLCALC 87 10/02/2023   ALT 40 12/31/2023   AST 39 12/31/2023   NA 137 12/31/2023   K 4.1 12/31/2023   CL 100 12/31/2023   CREATININE 0.95 12/31/2023   BUN 23 12/31/2023   CO2 27 12/31/2023   TSH 2.220 10/02/2023   INR 1.2 03/19/2023   HGBA1C 7.1 (A) 10/08/2023   MICROALBUR 10 mg/L 08/09/2022     Assessment & Plan:  Diabetic mononeuropathy associated with type 2 diabetes mellitus (HCC) Assessment & Plan: Currently uncontrolled with recent worsening.  Pain is worse at night.  Increasing gabapentin  to 300 mg at night.  Orders: -     Gabapentin ; Take 1 capsule (100 mg total) by mouth 2 (two) times daily.  Dispense: 180 capsule; Refill: 3 -     Gabapentin ; Take 1 capsule (300 mg total) by mouth at bedtime.  Dispense: 90 capsule; Refill: 3  Essential hypertension, benign Assessment & Plan: Stable. Continue HCTZ, losartan , and metoprolol .   Type 2 diabetes  mellitus with stage 3a chronic kidney disease, without long-term current use of insulin  [facility]) Assessment & Plan: Has upcoming follow-up with endocrinology.     Follow-up:  6 months  Dwayne Begay Bluford DO [facility]

## 2024-03-12 NOTE — Assessment & Plan Note (Signed)
 Currently uncontrolled with recent worsening.  Pain is worse at night.  Increasing gabapentin  to 300 mg at night.

## 2024-03-13 ENCOUNTER — Other Ambulatory Visit: Payer: Self-pay | Admitting: Nurse Practitioner

## 2024-03-13 ENCOUNTER — Ambulatory Visit (INDEPENDENT_AMBULATORY_CARE_PROVIDER_SITE_OTHER): Admitting: Nurse Practitioner

## 2024-03-13 ENCOUNTER — Encounter: Payer: Self-pay | Admitting: Nurse Practitioner

## 2024-03-13 VITALS — BP 120/80 | HR 65 | Ht 64.0 in | Wt 181.2 lb

## 2024-03-13 DIAGNOSIS — N182 Chronic kidney disease, stage 2 (mild): Secondary | ICD-10-CM

## 2024-03-13 DIAGNOSIS — Z794 Long term (current) use of insulin: Secondary | ICD-10-CM | POA: Diagnosis not present

## 2024-03-13 DIAGNOSIS — E039 Hypothyroidism, unspecified: Secondary | ICD-10-CM

## 2024-03-13 DIAGNOSIS — I10 Essential (primary) hypertension: Secondary | ICD-10-CM

## 2024-03-13 DIAGNOSIS — E1122 Type 2 diabetes mellitus with diabetic chronic kidney disease: Secondary | ICD-10-CM

## 2024-03-13 DIAGNOSIS — E782 Mixed hyperlipidemia: Secondary | ICD-10-CM | POA: Diagnosis not present

## 2024-03-13 DIAGNOSIS — Z7984 Long term (current) use of oral hypoglycemic drugs: Secondary | ICD-10-CM | POA: Diagnosis not present

## 2024-03-13 LAB — POCT GLYCOSYLATED HEMOGLOBIN (HGB A1C): Hemoglobin A1C: 6.5 % — AB (ref 4.0–5.6)

## 2024-03-13 MED ORDER — ULTICARE MINI PEN NEEDLES 31G X 6 MM MISC: 3 refills | Status: DC

## 2024-03-13 MED ORDER — LEVOTHYROXINE SODIUM 125 MCG PO TABS: 125.0000 ug | ORAL_TABLET | Freq: Every day | ORAL | 3 refills | Status: DC

## 2024-03-13 NOTE — Progress Notes (Signed)
 Endocrinology Follow Up Note       03/13/2024, 9:04 AM   Subjective:    Patient ID: Kimberly [name], female    DOB: January 29, 1951.  Kimberly [name] is being seen in follow up after being seen in consultation for management of currently uncontrolled symptomatic diabetes requested by  Cook, [name], DO.   Past Medical History:  Diagnosis Date   Anxiety    Arthritis    Breast cancer (HCC) 07/2021   Depression    Diabetes mellitus    Family history of breast cancer    Family history of colon cancer    Family history of prostate cancer    Family history of skin cancer    GERD (gastroesophageal reflux disease)    High blood pressure    Hypothyroid    Obesity    PONV (postoperative nausea and vomiting)     Past Surgical History:  Procedure Laterality Date   ABDOMINAL HYSTERECTOMY     BREAST SURGERY  12/3//2022   Bunions Right    CARPAL TUNNEL RELEASE Right    JOINT REPLACEMENT     KNEE ARTHROSCOPY Right    MASTECTOMY MODIFIED RADICAL Right 07/29/2021   invasive ductal ca   THYROID  SURGERY     removal   TOTAL KNEE ARTHROPLASTY Right 08/25/2013   Procedure: TOTAL KNEE ARTHROPLASTY;  Surgeon: Taft FORBES Minerva, MD;  Location: [facility];  Service: Orthopedics;  Laterality: Right;   TOTAL KNEE ARTHROPLASTY Left 09/09/2014   Procedure: LEFT TOTAL KNEE ARTHROPLASTY;  Surgeon: Taft FORBES Minerva, MD;  Location: [facility];  Service: Orthopedics;  Laterality: Left;    Social History   Socioeconomic History   Marital status: Widowed    Spouse name: Not on file   Number of children: 5   Years of education: Not on file   Highest education level: Not on file  Occupational History   Occupation: CNA    Employer: [employer]  Tobacco Use   Smoking status: Never   Smokeless tobacco: Never  Vaping Use   Vaping status: Never Used  Substance and Sexual Activity   Alcohol use: No   Drug use: No   Sexual activity:  Yes    Birth control/protection: Surgical  Other Topics Concern   Not on file  Social History Narrative   Pt lives with husband Joe and 1 dog. (Recently widowed)    Social Drivers of Corporate investment banker Strain: Low Risk  (02/22/2024)   Overall Financial Resource Strain (CARDIA)    Difficulty of Paying Living Expenses: Not hard at all  Food Insecurity: No Food Insecurity (02/22/2024)   Hunger Vital Sign    Worried About Running Out of Food in the Last Year: Never true    Ran Out of Food in the Last Year: Never true  Transportation Needs: No Transportation Needs (02/22/2024)   PRAPARE - Administrator, Civil Service (Medical): No    Lack of Transportation (Non-Medical): No  Physical Activity: Insufficiently Active (02/22/2024)   Exercise Vital Sign    Days of Exercise per Week: 3 days    Minutes of Exercise per Session: 30 min  Stress: No Stress Concern  Present (02/22/2024)   [employer] of Occupational Health - Occupational Stress Questionnaire    Feeling of Stress: Only a little  Social Connections: Moderately Integrated (02/22/2024)   Social Connection and Isolation Panel    Frequency of Communication with Friends and Family: More than three times a week    Frequency of Social Gatherings with Friends and Family: More than three times a week    Attends Religious Services: More than 4 times per year    Active Member of [employer] or Organizations: Yes    Attends Banker Meetings: More than 4 times per year    Marital Status: Widowed    Family History  Problem Relation Age of Onset   Diabetes Mother    Hypertension Mother    Arthritis Mother    Congestive Heart Failure Father    Hypertension Father    Skin cancer Father 31       squamous cell on lip; metastatic   Cancer Father    Hearing loss Father    Heart disease Father    Hypertension Brother    Skin cancer Brother        squamous cell   Breast cancer Paternal Aunt        dx 41s d. 25s    Prostate cancer Paternal Uncle        dx 23s   Colon cancer Maternal Grandmother        dx 39s d. 23s   Diabetes Other    Cancer Cousin        unk type; possibly pancreatic    Outpatient Encounter Medications as of 03/13/2024  Medication Sig   anastrozole  (ARIMIDEX ) 1 MG tablet Take 1 tablet (1 mg total) by mouth daily.   calcium carbonate (CALCIUM 600) 600 MG TABS tablet Take 600 mg by mouth 2 (two) times daily with a meal.   donepezil  (ARICEPT ) 5 MG tablet Take 1 tablet (5 mg total) by mouth at bedtime.   gabapentin  (NEURONTIN ) 100 MG capsule Take 1 capsule (100 mg total) by mouth 2 (two) times daily. (Patient taking differently: Take 100 mg by mouth daily.)   gabapentin  (NEURONTIN ) 300 MG capsule Take 1 capsule (300 mg total) by mouth at bedtime.   glipiZIDE  (GLUCOTROL  XL) 5 MG 24 hr tablet Take 1 tablet (5 mg total) by mouth daily with breakfast.   hydrochlorothiazide  (HYDRODIURIL ) 25 MG tablet Take 1 tablet (25 mg total) by mouth daily.   Insulin  Glargine (TOUJEO  MAX SOLOSTAR [place]) Inject 32 Units into the skin at bedtime.   losartan  (COZAAR ) 100 MG tablet Take 1 tablet (100 mg total) by mouth every morning.   Magnesium  250 MG TABS Take 250 mg by mouth daily.   metFORMIN  (GLUCOPHAGE -XR) 500 MG 24 hr tablet Take 1 tablet (500 mg total) by mouth 2 (two) times daily with a meal.   metoprolol  succinate (TOPROL -XL) 100 MG 24 hr tablet TAKE 1 TABLET AT BEDTIME   WITH OR IMMEDIATELY        FOLLOWING A MEAL   miconazole  (MICOTIN) 2 % powder Apply topically as needed for itching.   omeprazole  (PRILOSEC) 20 MG capsule Take 1 capsule (20 mg total) by mouth at bedtime.   venlafaxine  XR (EFFEXOR -XR) 75 MG 24 hr capsule TAKE 1 CAPSULE BY MOUTH ONCE DAILY WITH BREAKFAST   Vitamin D , Ergocalciferol , (DRISDOL ) 1.25 MG (50000 UNIT) CAPS capsule Take 50,000 Units by mouth every 7 (seven) days.   [DISCONTINUED] levothyroxine  (SYNTHROID ) 125 MCG tablet TAKE ONE TABLET  BY MOUTH ONCE DAILY BEFORE BREAKFAST    [DISCONTINUED] ULTICARE MINI PEN NEEDLES 31G X 6 MM MISC daily. as directed   levothyroxine  (SYNTHROID ) 125 MCG tablet Take 1 tablet (125 mcg total) by mouth daily before breakfast.   ULTICARE MINI PEN NEEDLES 31G X 6 MM MISC Use to inject insulin  once daily   [DISCONTINUED] cephALEXin  (KEFLEX ) 500 MG capsule Take 1 capsule (500 mg total) by mouth 2 (two) times daily.   [DISCONTINUED] famotidine  (PEPCID ) 20 MG tablet TAKE 1 TABLET AT BEDTIME (Patient not taking: Reported on 03/12/2024)   [DISCONTINUED] gabapentin  (NEURONTIN ) 100 MG capsule TAKE ONE CAPSULE BY MOUTH THREE TIMES DAILY   [DISCONTINUED] insulin  degludec (TRESIBA  FLEXTOUCH) 100 UNIT/ML FlexTouch Pen Inject 34 Units into the skin at bedtime. (Patient not taking: Reported on 03/13/2024)   [DISCONTINUED] Insulin  Glargine (BASAGLAR  KWIKPEN) 100 UNIT/ML Inject 34 Units into the skin at bedtime.   No facility-administered encounter medications on file as of 03/13/2024.    ALLERGIES: Allergies  Allergen Reactions   Bactrim  [Sulfamethoxazole -Trimethoprim ] Other (See Comments)    Was told during hospital admission by providers to not ever take this again as it made her worse per patient   Ace Inhibitors Cough    VACCINATION STATUS: Immunization History  Administered Date(s) Administered   Fluad Quad(high Dose 65+) 07/12/2020   Fluad Trivalent(High Dose 65+) 05/02/2023   Influenza,inj,Quad PF,6+ Mos 04/30/2018, 08/24/2022   Influenza-Unspecified 05/21/2016, 05/03/2017, 06/21/2019   Moderna Sars-Covid-2 Vaccination 10/07/2019, 11/05/2019, 08/02/2020   PNEUMOCOCCAL CONJUGATE-20 10/31/2023   Pneumococcal Conjugate-13 03/03/2014, 05/30/2017   Pneumococcal Polysaccharide-23 05/27/2010    Diabetes She presents for her follow-up diabetic visit. She has type 2 diabetes mellitus. Onset time: Diagnosed at approx age of 76. Her disease course has been improving. There are no hypoglycemic associated symptoms. Pertinent negatives for  hypoglycemia include no nervousness/anxiousness or tremors. Associated symptoms include foot paresthesias. Pertinent negatives for diabetes include no fatigue, no polydipsia, no polyuria and no weight loss. There are no hypoglycemic complications. Symptoms are stable. Diabetic complications include nephropathy and peripheral neuropathy. Risk factors for coronary artery disease include diabetes mellitus, dyslipidemia, family history, obesity, post-menopausal and hypertension. Current diabetic treatment includes oral agent (dual therapy) and insulin  injections. She is compliant with treatment most of the time. Her weight is decreasing steadily. She is following a generally healthy diet. Meal planning includes avoidance of concentrated sweets. She has had a previous visit with a dietitian. She participates in exercise three times a week. Her home blood glucose trend is decreasing steadily. Her overall blood glucose range is 140-180 mg/dl. (She presents today with her CGM showing at goal glycemic profile overall.  Her POCT A1c today is 6.5%, improving from last visit of 7.1%.  Analysis of her CGM shows TIR 80%, TAR 20%, TBR < 1% with a GMI of 6.9%.  She lost her husband in April, feels like she is stress eating more. ) An ACE inhibitor/angiotensin II receptor blocker is being taken. She sees a podiatrist (has seen one in the distant past).Eye exam is current.  Hyperlipidemia This is a chronic problem. The current episode started more than 1 year ago. The problem is uncontrolled. Recent lipid tests were reviewed and are variable. Exacerbating diseases include chronic renal disease, diabetes, hypothyroidism and obesity. Factors aggravating her hyperlipidemia include thiazides and beta blockers. Current antihyperlipidemic treatment includes diet change and exercise. The current treatment provides mild improvement of lipids. There are no compliance problems.  Risk factors for coronary artery disease include diabetes  mellitus,  dyslipidemia, family history, hypertension, obesity and post-menopausal.  Hypertension This is a chronic problem. The current episode started more than 1 year ago. The problem has been resolved since onset. The problem is controlled. Pertinent negatives include no palpitations. Agents associated with hypertension include thyroid  hormones. Risk factors for coronary artery disease include diabetes mellitus, dyslipidemia, family history, obesity and post-menopausal state. Past treatments include angiotensin blockers, beta blockers and diuretics. The current treatment provides moderate improvement. There are no compliance problems.  Hypertensive end-organ damage includes kidney disease. Identifiable causes of hypertension include chronic renal disease and a thyroid  problem.  Thyroid  Problem Presents for follow-up visit. Patient reports no anxiety, cold intolerance, constipation, depressed mood, fatigue, heat intolerance, leg swelling, palpitations, tremors, weight gain or weight loss. The symptoms have been stable. Her past medical history is significant for diabetes.    Review of systems  Constitutional: + Minimally fluctuating body weight,  current Body mass index is 31.1 kg/m. , no fatigue, no subjective hyperthermia, no subjective hypothermia Eyes: no blurry vision, no xerophthalmia ENT: no sore throat, no nodules palpated in throat, no dysphagia/odynophagia, no hoarseness Cardiovascular: no chest pain, no shortness of breath, no palpitations, no leg swelling Respiratory: no cough, no shortness of breath Gastrointestinal: no nausea/vomiting/diarrhea Musculoskeletal: no muscle/joint aches Skin: no rashes, no hyperemia Neurological: no tremors, + numbness/ tingling/ burning pain to bilateral feet, no dizziness Psychiatric: no depression, no anxiety, + increased stress (recently lost her husband in April)  Objective:     BP 120/80 (BP Location: Left Arm, Patient Position: Sitting, Cuff  Size: Large)   Pulse 65   Ht 5' 4 (1.626 m)   Wt 181 lb 3.2 oz (82.2 kg)   BMI 31.10 kg/m   Wt Readings from Last 3 Encounters:  03/13/24 181 lb 3.2 oz (82.2 kg)  03/12/24 181 lb (82.1 kg)  02/22/24 185 lb (83.9 kg)     BP Readings from Last 3 Encounters:  03/13/24 120/80  03/12/24 124/64  01/17/24 (!) 152/64      Physical Exam- Limited  Constitutional:  Body mass index is 31.1 kg/m. , not in acute distress, normal state of mind Eyes:  EOMI, no exophthalmos Musculoskeletal: no gross deformities, strength intact in all four extremities, no gross restriction of joint movements Skin:  no rashes, no hyperemia Neurological: no tremor with outstretched hands   Diabetic Foot Exam - Simple   Simple Foot Form Visual Inspection See comments: Yes Sensation Testing Intact to touch and monofilament testing bilaterally: Yes Pulse Check Posterior Tibialis and Dorsalis pulse intact bilaterally: Yes Comments Onychomycosis bilaterally      CMP ( most recent) CMP     Component Value Date/Time   NA 137 12/31/2023 1012   NA 142 11/27/2023 1019   K 4.1 12/31/2023 1012   CL 100 12/31/2023 1012   CO2 27 12/31/2023 1012   GLUCOSE 121 (H) 12/31/2023 1012   BUN 23 12/31/2023 1012   BUN 24 11/27/2023 1019   CREATININE 0.95 12/31/2023 1012   CREATININE 1.06 08/11/2014 0701   CALCIUM 9.8 12/31/2023 1012   CALCIUM 10.9 (H) 12/28/2022 1452   PROT 7.8 12/31/2023 1012   PROT 7.8 12/05/2023 1141   ALBUMIN 4.3 12/31/2023 1012   ALBUMIN 4.6 11/27/2023 1019   AST 39 12/31/2023 1012   ALT 40 12/31/2023 1012   ALKPHOS 69 12/31/2023 1012   BILITOT 0.8 12/31/2023 1012   BILITOT 0.6 11/27/2023 1019   GFRNONAA >60 12/31/2023 1012   GFRAA 65 10/11/2020 1146  Diabetic Labs (most recent): Lab Results  Component Value Date   HGBA1C 6.5 (A) 03/13/2024   HGBA1C 7.1 (A) 10/08/2023   HGBA1C 6.4 (H) 03/19/2023   MICROALBUR 10 mg/L 08/09/2022   MICROALBUR 1.1 06/03/2014   MICROALBUR  3.48 (H) 07/09/2013     Lipid Panel ( most recent) Lipid Panel     Component Value Date/Time   CHOL 163 10/02/2023 0857   TRIG 213 (H) 10/02/2023 0857   HDL 40 10/02/2023 0857   CHOLHDL 4.1 10/02/2023 0857   CHOLHDL 3.7 06/03/2014 0709   VLDL 40 06/03/2014 0709   LDLCALC 87 10/02/2023 0857   LABVLDL 36 10/02/2023 0857      Lab Results  Component Value Date   TSH 2.220 10/02/2023   TSH 0.249 (L) 03/19/2023   TSH 0.490 01/16/2023   TSH 0.414 (L) 07/31/2022   TSH 0.240 (L) 01/27/2022   TSH 0.133 (L) 10/03/2021   TSH 0.653 04/11/2021   TSH 1.490 04/19/2020   TSH 2.470 07/31/2019   TSH 0.528 04/22/2018   FREET4 1.17 10/02/2023   FREET4 1.13 (H) 03/26/2023   FREET4 1.33 07/31/2022   FREET4 1.31 01/27/2022   FREET4 1.65 10/03/2021           Assessment & Plan:   1) Type 2 diabetes mellitus with diabetic nephropathy, without long-term current use of insulin  (HCC)  She presents today with her CGM showing at goal glycemic profile overall.  Her POCT A1c today is 6.5%, improving from last visit of 7.1%.  Analysis of her CGM shows TIR 80%, TAR 20%, TBR < 1% with a GMI of 6.9%.  She lost her husband in April, feels like she is stress eating more.   - Kimberly [name] has currently uncontrolled symptomatic type 2 DM since 73 years of age.  -Recent labs reviewed.  - I had a long discussion with her about the progressive nature of diabetes and the pathology behind its complications. -her diabetes is complicated by mild CKD, peripheral neuropathy and she remains at a high risk for more acute and chronic complications which include CAD, CVA, CKD, retinopathy, and neuropathy. These are all discussed in detail with her.  - Nutritional counseling repeated at each appointment due to patients tendency to fall back in to old habits.  - The patient admits there is a room for improvement in their diet and drink choices. -  Suggestion is made for the patient to avoid simple carbohydrates  from their diet including Cakes, Sweet Desserts / Pastries, Ice Cream, Soda (diet and regular), Sweet Tea, Candies, Chips, Cookies, Sweet Pastries, Store Bought Juices, Alcohol in Excess of 1-2 drinks a day, Artificial Sweeteners, Coffee Creamer, and Sugar-free Products. This will help patient to have stable blood glucose profile and potentially avoid unintended weight gain.   - I encouraged the patient to switch to unprocessed or minimally processed complex starch and increased protein intake (animal or plant source), fruits, and vegetables.   - Patient is advised to stick to a routine mealtimes to eat 3 meals a day and avoid unnecessary snacks (to snack only to correct hypoglycemia).  - she declines referral to Penny Crumpton, RDN, CDE for diabetes education at this time.  - I have approached her with the following individualized plan to manage her diabetes and patient agrees:   -She is advised to continue her Toujeo  32 units SQ nightly, continue Metformin  500 mg ER po twice daily with meals and Glipizide  5 mg XL daily with breakfast for now.  May try to de-escalate her treatment at next visit if she maintains such good control.  -she is encouraged to continue monitoring blood glucose twice daily (using her CGM), before breakfast and before bed, and to call the clinic if she has readings less than 70 or above 200 for 3 tests in a row.   She is benefiting from CGM device.  - she will be considered for incretin therapy as appropriate next visit.  - Specific targets for  A1c; LDL, HDL, and Triglycerides were discussed with the patient.  2) Blood Pressure /Hypertension:  her blood pressure is controlled to target.   she is advised to continue her current medications including HCTZ 25 mg po daily, Losartan  100 mg p.o. daily with breakfast, and Metoprolol  100 mg po daily.  3) Lipids/Hyperlipidemia:    Review of her recent lipid panel from 10/02/23 showed controlled LDL at 87 and elevated  triglycerides of 213 (improved).  She prefers to stay away from statins at this time.  She is advised to avoid fried foods, red meats, and butter.    4)  Weight/Diet:  her Body mass index is 31.1 kg/m.  -  clearly complicating her diabetes care.   she is a candidate for weight loss. I discussed with her the fact that loss of 5 - 10% of her  current body weight will have the most impact on her diabetes management.  Exercise, and detailed carbohydrates information provided  -  detailed on discharge instructions.  5) Vitamin D  Deficiency Her recent vitamin D  level was 42.29 on 07/06/23- checked by her oncologist. She is also undergoing evaluation for hypercalcemia with him as well.  6) Hypothyroidism-unspecified There are no recent TFTs to review.  She is advised to continue Levothyroxine  125 mcg po daily before breakfast.  Will recheck TFTs prior to next visit and adjust dose accordingly.   - The correct intake of thyroid  hormone (Levothyroxine , Synthroid ), is on empty stomach first thing in the morning, with water, separated by at least 30 minutes from breakfast and other medications,  and separated by more than 4 hours from calcium, iron, multivitamins, acid reflux medications (PPIs).  - This medication is a life-long medication and will be needed to correct thyroid  hormone imbalances for the rest of your life.  The dose may change from time to time, based on thyroid  blood work.  - It is extremely important to be consistent taking this medication, near the same time each morning.  -AVOID TAKING PRODUCTS CONTAINING BIOTIN (commonly found in Hair, Skin, Nails vitamins) AS IT INTERFERES WITH THE VALIDITY OF THYROID  FUNCTION BLOOD TESTS.  6) Chronic Care/Health Maintenance: -she is on ACEI/ARB medications and is encouraged to initiate and continue to follow up with Ophthalmology, Dentist, Podiatrist at least yearly or according to recommendations, and advised to stay away from smoking. I have  recommended yearly flu vaccine and pneumonia vaccine at least every 5 years; moderate intensity exercise for up to 150 minutes weekly; and sleep for at least 7 hours a day.  - she is advised to maintain close follow up with Cook, [name], DO for primary care needs, as well as her other providers for optimal and coordinated care.     I spent  42  minutes in the care of the patient today including review of labs from CMP, Lipids, Thyroid  Function, Hematology (current and previous including abstractions from other facilities); face-to-face time discussing  her blood glucose readings/logs, discussing hypoglycemia and hyperglycemia episodes and symptoms, medications doses,  her options of short and long term treatment based on the latest standards of care / guidelines;  discussion about incorporating lifestyle medicine;  and documenting the encounter. Risk reduction counseling performed per USPSTF guidelines to reduce obesity and cardiovascular risk factors.     Please refer to Patient Instructions for Blood Glucose Monitoring and Insulin /Medications Dosing Guide  in media tab for additional information. Please  also refer to  Patient Self Inventory in the Media  tab for reviewed elements of pertinent patient history.  Kimberly [name] participated in the discussions, expressed understanding, and voiced agreement with the above plans.  All questions were answered to her satisfaction. she is encouraged to contact clinic should she have any questions or concerns prior to her return visit.     Follow up plan: - Return in about 4 months (around 07/14/2024) for Diabetes F/U with A1c in office, Thyroid  follow up, Previsit labs.   Benton Rio, [facility] [address] [place], [place] [address] Phone: [phone] Fax: [phone]  03/13/2024, 9:04 AM

## 2024-03-27 ENCOUNTER — Telehealth: Payer: Self-pay | Admitting: *Deleted

## 2024-03-27 NOTE — Telephone Encounter (Signed)
 Pharmacist notified at [facility] and verbalized understanding.

## 2024-03-27 NOTE — Telephone Encounter (Signed)
 Copied from CRM [id number]. Topic: Clinical - Medication Question >> Mar 27, 2024 10:53 AM Charlet HERO wrote: Reason for CRM: [facility] is calling to get clarification on med gabapentin  (NEURONTIN ) 100 MG capsule, she says that there is a discrepancy with the med that she will need to clear up to fill the script. [phone] ask for Tonya.

## 2024-04-14 ENCOUNTER — Encounter: Payer: Self-pay | Admitting: Neurology

## 2024-04-14 ENCOUNTER — Other Ambulatory Visit: Payer: Self-pay | Admitting: Nurse Practitioner

## 2024-04-14 ENCOUNTER — Ambulatory Visit (INDEPENDENT_AMBULATORY_CARE_PROVIDER_SITE_OTHER): Admitting: Neurology

## 2024-04-14 VITALS — BP 130/85 | HR 55 | Ht 64.0 in | Wt 187.0 lb

## 2024-04-14 DIAGNOSIS — R413 Other amnesia: Secondary | ICD-10-CM | POA: Diagnosis not present

## 2024-04-14 DIAGNOSIS — E1122 Type 2 diabetes mellitus with diabetic chronic kidney disease: Secondary | ICD-10-CM

## 2024-04-14 DIAGNOSIS — Z7984 Long term (current) use of oral hypoglycemic drugs: Secondary | ICD-10-CM

## 2024-04-14 DIAGNOSIS — Z794 Long term (current) use of insulin: Secondary | ICD-10-CM

## 2024-04-14 DIAGNOSIS — N1831 Chronic kidney disease, stage 3a: Secondary | ICD-10-CM

## 2024-04-14 DIAGNOSIS — R200 Anesthesia of skin: Secondary | ICD-10-CM

## 2024-04-14 DIAGNOSIS — E1121 Type 2 diabetes mellitus with diabetic nephropathy: Secondary | ICD-10-CM

## 2024-04-14 DIAGNOSIS — R269 Unspecified abnormalities of gait and mobility: Secondary | ICD-10-CM

## 2024-04-14 DIAGNOSIS — Z853 Personal history of malignant neoplasm of breast: Secondary | ICD-10-CM

## 2024-04-14 NOTE — Progress Notes (Addendum)
 [facility]  PATIENT: Kimberly [name] DOB: Sep 26, 1950  REFERRING DOCTOR OR PCP:  Jacqulyn Ahle, DO SOURCE: Patient, notes from primary care, imaging and lab results, MRI images personally reviewed.  _________________________________   HISTORICAL  CHIEF COMPLAINT:  Chief Complaint  Patient presents with   Follow-up    Pt in room 10. Daughter in room. Here for memory follow up. Pt and daughter said memory has improved since last visit. MOCA:28    HISTORY OF PRESENT ILLNESS:  I had the pleasure of seeing your patient, Kimberly [name], at [facility] for neurologic consultation regarding her memory difficulties.  UPDATE 04/14/2024.     She is here today with her daughter.  Since the last visit, she has had blood work for Alzheimer's disease.  The amyloid beta 42/40 ratio is decreased at 0.095 and the pTau181 is increased at 1.55.  These changes are consistent with Alzheimer's pathology.  Additionally, the SPEP/IFE came back with 0.1 g/daL of an M protein and immunofixation showed it was an IgG kappa she has seen hematology.  The bone survey did not show any lytic lesions.  She actually feels much better and feels cognition is back to baseline.  She scored 28/30 on the [facility] cognitive assessment (normal)  At the last  visit, she was not doing as well.  She has begun to note memory difficulty in June 2024 possibly associated with multiple hospital admissions for UTI.  She reports being unresponsive x 5 days with one.  Discharge summary reports delirium/AMS  Her husband and friends had also noted any memory issues before June 2024.    She also has more trouble with complex tasks (like income tax).   She has lost motivation to read and used to read regularly.     She feels her gait and balance are also doing better.  She notes urinary urgency and some incontinence, mostly at night  At the last visit, she had had depression and iwas more irritable  since June 2024.    She also had some anxiety.    She was sleeping poorly many nights, often waking up and having trouble falling back asleep.     Her husband died right after her firtst visit - she notes improved mood as he was difficult and raised her stress.      MRI brain 11/27/2023 shows mild atrophy and microvascular ischemic changes with pari-atrial confluencies.  Ventricle size  proportionate to atrophy.   No specific lobar atrophy.    Comorbidities: DM, HTN, H/o breat cancer (reportedly cancer free), goiter s/p thyroidectomy and synthroid  replacement.        04/14/2024    1:01 PM 12/05/2023   10:51 AM  Montreal Cognitive Assessment   Visuospatial/ Executive (0/5) 5 3  Naming (0/3) 3 3  Attention: Read list of digits (0/2) 2 2  Attention: Read list of letters (0/1) 1 1  Attention: Serial 7 subtraction starting at 100 (0/3) 3 1  Language: Repeat phrase (0/2) 1 2  Language : Fluency (0/1) 1 0  Abstraction (0/2) 2 1  Delayed Recall (0/5) 4 1  Orientation (0/6) 6 5  Total 28 19     REVIEW OF SYSTEMS: Constitutional: No fevers, chills, sweats, or change in appetite Eyes: No visual changes, double vision, eye pain Ear, nose and throat: No hearing loss, ear pain, nasal congestion, sore throat Cardiovascular: No chest pain, palpitations Respiratory:  No shortness of breath at rest or with exertion.   No wheezes  GastrointestinaI: No nausea, vomiting, diarrhea, abdominal pain, fecal incontinence Genitourinary:  No dysuria, urinary retention or frequency.  No nocturia. Musculoskeletal:  No neck pain, back pain Integumentary: No rash, pruritus, skin lesions Neurological: as above Psychiatric: No depression at this time.  No anxiety Endocrine: No palpitations, diaphoresis, change in appetite, change in weigh or increased thirst Hematologic/Lymphatic:  No anemia, purpura, petechiae. Allergic/Immunologic: No itchy/runny eyes, nasal congestion, recent allergic reactions,  rashes  ALLERGIES: Allergies  Allergen Reactions   Bactrim  [Sulfamethoxazole -Trimethoprim ] Other (See Comments)    Was told during hospital admission by providers to not ever take this again as it made her worse per patient   Ace Inhibitors Cough    HOME MEDICATIONS:  Current Outpatient Medications:    anastrozole  (ARIMIDEX ) 1 MG tablet, Take 1 tablet (1 mg total) by mouth daily., Disp: 90 tablet, Rfl: 3   calcium carbonate (CALCIUM 600) 600 MG TABS tablet, Take 600 mg by mouth 2 (two) times daily with a meal., Disp: , Rfl:    donepezil  (ARICEPT ) 5 MG tablet, Take 1 tablet (5 mg total) by mouth at bedtime., Disp: 90 tablet, Rfl: 3   gabapentin  (NEURONTIN ) 100 MG capsule, Take 1 capsule (100 mg total) by mouth 2 (two) times daily. (Patient taking differently: Take 100 mg by mouth daily.), Disp: 180 capsule, Rfl: 3   gabapentin  (NEURONTIN ) 300 MG capsule, Take 1 capsule (300 mg total) by mouth at bedtime., Disp: 90 capsule, Rfl: 3   glipiZIDE  (GLUCOTROL  XL) 5 MG 24 hr tablet, Take 1 tablet (5 mg total) by mouth daily with breakfast., Disp: 90 tablet, Rfl: 3   hydrochlorothiazide  (HYDRODIURIL ) 25 MG tablet, Take 1 tablet (25 mg total) by mouth daily., Disp: 90 tablet, Rfl: 3   Insulin  Glargine (TOUJEO  MAX SOLOSTAR Jensen), Inject 32 Units into the skin at bedtime., Disp: , Rfl:    levothyroxine  (SYNTHROID ) 125 MCG tablet, Take 1 tablet (125 mcg total) by mouth daily before breakfast., Disp: 90 tablet, Rfl: 3   losartan  (COZAAR ) 100 MG tablet, Take 1 tablet (100 mg total) by mouth every morning., Disp: 90 tablet, Rfl: 3   Magnesium  250 MG TABS, Take 250 mg by mouth daily., Disp: , Rfl:    metFORMIN  (GLUCOPHAGE -XR) 500 MG 24 hr tablet, Take 1 tablet (500 mg total) by mouth 2 (two) times daily with a meal., Disp: 180 tablet, Rfl: 3   metoprolol  succinate (TOPROL -XL) 100 MG 24 hr tablet, TAKE 1 TABLET AT BEDTIME   WITH OR IMMEDIATELY        FOLLOWING A MEAL, Disp: 90 tablet, Rfl: 3   miconazole   (MICOTIN) 2 % powder, Apply topically as needed for itching., Disp: 70 g, Rfl: 0   omeprazole  (PRILOSEC) 20 MG capsule, Take 1 capsule (20 mg total) by mouth at bedtime., Disp: 90 capsule, Rfl: 3   ULTICARE MINI PEN NEEDLES 31G X 6 MM MISC, Use to inject insulin  once daily, Disp: 100 each, Rfl: 3   venlafaxine  XR (EFFEXOR -XR) 75 MG 24 hr capsule, TAKE 1 CAPSULE BY MOUTH ONCE DAILY WITH BREAKFAST, Disp: 90 capsule, Rfl: 3   Vitamin D , Ergocalciferol , (DRISDOL ) 1.25 MG (50000 UNIT) CAPS capsule, Take 50,000 Units by mouth every 7 (seven) days., Disp: , Rfl:   PAST MEDICAL HISTORY: Past Medical History:  Diagnosis Date   Anxiety    Arthritis    Breast cancer (HCC) 07/2021   Depression    Diabetes mellitus    Family history of breast cancer    Family history of  colon cancer    Family history of prostate cancer    Family history of skin cancer    GERD (gastroesophageal reflux disease)    High blood pressure    Hypothyroid    Obesity    PONV (postoperative nausea and vomiting)     PAST SURGICAL HISTORY: Past Surgical History:  Procedure Laterality Date   ABDOMINAL HYSTERECTOMY     BREAST SURGERY  12/3//2022   [place] Right    CARPAL TUNNEL RELEASE Right    JOINT REPLACEMENT     KNEE ARTHROSCOPY Right    MASTECTOMY MODIFIED RADICAL Right 07/29/2021   invasive ductal ca   THYROID  SURGERY     removal   TOTAL KNEE ARTHROPLASTY Right 08/25/2013   Procedure: TOTAL KNEE ARTHROPLASTY;  Surgeon: Taft FORBES Minerva, MD;  Location: [facility];  Service: Orthopedics;  Laterality: Right;   TOTAL KNEE ARTHROPLASTY Left 09/09/2014   Procedure: LEFT TOTAL KNEE ARTHROPLASTY;  Surgeon: Taft FORBES Minerva, MD;  Location: [facility];  Service: Orthopedics;  Laterality: Left;    FAMILY HISTORY: Family History  Problem Relation Age of Onset   Diabetes Mother    Hypertension Mother    Arthritis Mother    Congestive Heart Failure Father    Hypertension Father    Skin cancer Father 31       squamous  cell on lip; metastatic   Cancer Father    Hearing loss Father    Heart disease Father    Hypertension Brother    Skin cancer Brother        squamous cell   Breast cancer Paternal Aunt        dx 14s d. 47s   Prostate cancer Paternal Uncle        dx 32s   Colon cancer Maternal Grandmother        dx 85s d. 38s   Diabetes Other    Cancer Cousin        unk type; possibly pancreatic    SOCIAL HISTORY: Social History   Socioeconomic History   Marital status: Widowed    Spouse name: Not on file   Number of children: 5   Years of education: Not on file   Highest education level: Not on file  Occupational History   Occupation: CNA    Employer: [employer]  Tobacco Use   Smoking status: Never   Smokeless tobacco: Never  Vaping Use   Vaping status: Never Used  Substance and Sexual Activity   Alcohol use: No   Drug use: No   Sexual activity: Yes    Birth control/protection: Surgical  Other Topics Concern   Not on file  Social History Narrative   Pt lives with husband Joe and 1 dog. (Recently widowed)    Social Drivers of Corporate investment banker Strain: Low Risk  (02/22/2024)   Overall Financial Resource Strain (CARDIA)    Difficulty of Paying Living Expenses: Not hard at all  Food Insecurity: No Food Insecurity (02/22/2024)   Hunger Vital Sign    Worried About Running Out of Food in the Last Year: Never true    Ran Out of Food in the Last Year: Never true  Transportation Needs: No Transportation Needs (02/22/2024)   PRAPARE - Administrator, Civil Service (Medical): No    Lack of Transportation (Non-Medical): No  Physical Activity: Insufficiently Active (02/22/2024)   Exercise Vital Sign    Days of Exercise per Week: 3 days    Minutes  of Exercise per Session: 30 min  Stress: No Stress Concern Present (02/22/2024)   [employer] of Occupational Health - Occupational Stress Questionnaire    Feeling of Stress: Only a little  Social Connections:  Moderately Integrated (02/22/2024)   Social Connection and Isolation Panel    Frequency of Communication with Friends and Family: More than three times a week    Frequency of Social Gatherings with Friends and Family: More than three times a week    Attends Religious Services: More than 4 times per year    Active Member of [employer] or Organizations: Yes    Attends Banker Meetings: More than 4 times per year    Marital Status: Widowed  Intimate Partner Violence: Not At Risk (02/22/2024)   Humiliation, Afraid, Rape, and Kick questionnaire    Fear of Current or Ex-Partner: No    Emotionally Abused: No    Physically Abused: No    Sexually Abused: No       PHYSICAL EXAM  Vitals:   04/14/24 1255 04/14/24 1300  BP: (!) 179/94 130/85  Pulse: (!) 55   Weight: 187 lb (84.8 kg)   Height: 5' 4 (1.626 m)     Body mass index is 32.1 kg/m.   General: The patient is well-developed and well-nourished and in no acute distress  HEENT:  Head is [place]/AT.  Sclera are anicteric.   Neck: No carotid bruits are noted.  The neck is nontender.  Cardiovascular: The heart has a regular rate and rhythm with a normal S1 and S2. There were no murmurs, gallops or rubs.    Skin: Extremities are without rash or  edema.  Musculoskeletal:  Back is nontender  Neurologic Exam  Mental status: Today, she scored 28/30 on the [facility] cognitive assessment which is normal.  She lost 1 point for language repetition and 1 for short-term recall.  Executive function was 5/5 today.  Speech is normal.  Cranial nerves: Extraocular movements are full.   There is good facial sensation to soft touch bilaterally.Facial strength is normal.  Trapezius and sternocleidomastoid strength is normal. No dysarthria is noted.  The tongue is midline, and the patient has symmetric elevation of the soft palate. No obvious hearing deficits are noted.  Motor:  Muscle bulk is normal.   No tremor.   Tone is normal. Strength is  5  / 5 in all 4 extremities.   Sensory: Sensory testing is intact to pinprick, soft touch and vibration sensation in all 4 extremities.  Coordination: Cerebellar testing reveals good finger-nose-finger and heel-to-shin bilaterally.  Gait and station: Station is normal.   She has a mildly reduced stride.  Tandem is wide.  She can turn in 3 steps . Romberg is negative.   Reflexes: Deep tendon reflexes are symmetric and normal bilaterally.   Plantar responses are flexor.    DIAGNOSTIC DATA (LABS, IMAGING, TESTING) - I reviewed patient records, labs, notes, testing and imaging myself where available.  Lab Results  Component Value Date   WBC 7.2 12/31/2023   HGB 11.6 (L) 12/31/2023   HCT 35.1 (L) 12/31/2023   MCV 98.9 12/31/2023   PLT 294 12/31/2023      Component Value Date/Time   NA 137 12/31/2023 1012   NA 142 11/27/2023 1019   K 4.1 12/31/2023 1012   CL 100 12/31/2023 1012   CO2 27 12/31/2023 1012   GLUCOSE 121 (H) 12/31/2023 1012   BUN 23 12/31/2023 1012   BUN 24 11/27/2023 1019  CREATININE 0.95 12/31/2023 1012   CREATININE 1.06 08/11/2014 0701   CALCIUM 9.8 12/31/2023 1012   CALCIUM 10.9 (H) 12/28/2022 1452   PROT 7.8 12/31/2023 1012   PROT 7.8 12/05/2023 1141   ALBUMIN 4.3 12/31/2023 1012   ALBUMIN 4.6 11/27/2023 1019   AST 39 12/31/2023 1012   ALT 40 12/31/2023 1012   ALKPHOS 69 12/31/2023 1012   BILITOT 0.8 12/31/2023 1012   BILITOT 0.6 11/27/2023 1019   GFRNONAA >60 12/31/2023 1012   GFRAA 65 10/11/2020 1146   Lab Results  Component Value Date   CHOL 163 10/02/2023   HDL 40 10/02/2023   LDLCALC 87 10/02/2023   TRIG 213 (H) 10/02/2023   CHOLHDL 4.1 10/02/2023   Lab Results  Component Value Date   HGBA1C 6.5 (A) 03/13/2024   Lab Results  Component Value Date   VITAMINB12 352 05/14/2023   Lab Results  Component Value Date   TSH 2.220 10/02/2023       ASSESSMENT AND PLAN  Memory loss  History of breast cancer  Gait  disturbance  Numbness   We had a long discussion about the results of the Alzheimer's disease biomarker studies.  Clinically she is much better than she was at the last visit and is in the normal range on the [facility] cognitive assessment.  However, the amyloid markers, amyloid beta 42/40 ratio and pTau181 were abnormal consistent with Alzheimer's pathology.  Therefore, it is probable that she has very early Alzheimer's disease.  This may have made her more susceptible to altered mental status with her urinary tract infections over the last year. We discussed options, she is on donepezil  5 mg and will continue.  She is not interested in starting an antiamyloid medication at this time.  Therefore, I will hold off on checking an amyloid PET scan which would be more accurate than the blood biomarkers.  We will reassess her in 9 months and if there is progression she might reconsider. Stay active and exercise as tolerated.  40-minute office visit with the majority of the time spent face-to-face for history and physical, discussion/counseling and decision-making.  Additional time with record review and documentation.  This visit is part of a comprehensive longitudinal care medical relationship regarding the patients primary diagnosis of memory loss and related concerns.   Cordie Buening A. Vear, MD, [facility] 04/14/2024, 7:10 PM Certified in Neurology, Clinical Neurophysiology, Sleep Medicine and Neuroimaging  [facility] [address] [place], [place] [address] [phone]

## 2024-04-14 NOTE — Addendum Note (Signed)
 Addended by: VEAR CHARLIE LABOR on: 04/14/2024 07:11 PM   Modules accepted: Level of Service

## 2024-04-25 ENCOUNTER — Other Ambulatory Visit: Payer: Self-pay | Admitting: Nurse Practitioner

## 2024-04-25 DIAGNOSIS — E1121 Type 2 diabetes mellitus with diabetic nephropathy: Secondary | ICD-10-CM

## 2024-04-25 DIAGNOSIS — Z794 Long term (current) use of insulin: Secondary | ICD-10-CM

## 2024-04-25 DIAGNOSIS — Z7984 Long term (current) use of oral hypoglycemic drugs: Secondary | ICD-10-CM

## 2024-04-25 DIAGNOSIS — E1122 Type 2 diabetes mellitus with diabetic chronic kidney disease: Secondary | ICD-10-CM

## 2024-04-25 DIAGNOSIS — N1831 Chronic kidney disease, stage 3a: Secondary | ICD-10-CM

## 2024-05-09 ENCOUNTER — Other Ambulatory Visit: Payer: Self-pay | Admitting: Nurse Practitioner

## 2024-05-09 DIAGNOSIS — E1121 Type 2 diabetes mellitus with diabetic nephropathy: Secondary | ICD-10-CM

## 2024-05-09 DIAGNOSIS — N1831 Chronic kidney disease, stage 3a: Secondary | ICD-10-CM

## 2024-05-09 DIAGNOSIS — Z794 Long term (current) use of insulin: Secondary | ICD-10-CM

## 2024-05-09 DIAGNOSIS — Z7984 Long term (current) use of oral hypoglycemic drugs: Secondary | ICD-10-CM

## 2024-05-09 DIAGNOSIS — E1122 Type 2 diabetes mellitus with diabetic chronic kidney disease: Secondary | ICD-10-CM

## 2024-05-15 ENCOUNTER — Other Ambulatory Visit: Payer: Self-pay | Admitting: Nurse Practitioner

## 2024-05-15 DIAGNOSIS — E1122 Type 2 diabetes mellitus with diabetic chronic kidney disease: Secondary | ICD-10-CM

## 2024-05-15 DIAGNOSIS — E1121 Type 2 diabetes mellitus with diabetic nephropathy: Secondary | ICD-10-CM

## 2024-05-15 DIAGNOSIS — Z794 Long term (current) use of insulin: Secondary | ICD-10-CM

## 2024-05-15 DIAGNOSIS — Z7984 Long term (current) use of oral hypoglycemic drugs: Secondary | ICD-10-CM

## 2024-05-19 ENCOUNTER — Other Ambulatory Visit: Payer: Self-pay | Admitting: Nurse Practitioner

## 2024-05-19 DIAGNOSIS — E1122 Type 2 diabetes mellitus with diabetic chronic kidney disease: Secondary | ICD-10-CM

## 2024-05-19 DIAGNOSIS — N1831 Chronic kidney disease, stage 3a: Secondary | ICD-10-CM

## 2024-05-19 DIAGNOSIS — Z794 Long term (current) use of insulin: Secondary | ICD-10-CM

## 2024-05-19 DIAGNOSIS — E1121 Type 2 diabetes mellitus with diabetic nephropathy: Secondary | ICD-10-CM

## 2024-05-19 DIAGNOSIS — Z7984 Long term (current) use of oral hypoglycemic drugs: Secondary | ICD-10-CM

## 2024-05-27 DIAGNOSIS — Z23 Encounter for immunization: Secondary | ICD-10-CM | POA: Diagnosis not present

## 2024-05-28 ENCOUNTER — Other Ambulatory Visit: Payer: Self-pay | Admitting: *Deleted

## 2024-05-28 MED ORDER — TOUJEO MAX SOLOSTAR 300 UNIT/ML ~~LOC~~ SOPN: 32.0000 [IU] | PEN_INJECTOR | Freq: Every day | SUBCUTANEOUS | 0 refills | Status: DC

## 2024-06-09 ENCOUNTER — Other Ambulatory Visit: Payer: Self-pay | Admitting: Nurse Practitioner

## 2024-07-09 ENCOUNTER — Ambulatory Visit: Admitting: Neurology

## 2024-07-14 DIAGNOSIS — M79675 Pain in left toe(s): Secondary | ICD-10-CM | POA: Diagnosis not present

## 2024-07-14 DIAGNOSIS — L600 Ingrowing nail: Secondary | ICD-10-CM | POA: Diagnosis not present

## 2024-07-16 DIAGNOSIS — E039 Hypothyroidism, unspecified: Secondary | ICD-10-CM | POA: Diagnosis not present

## 2024-07-16 DIAGNOSIS — E1122 Type 2 diabetes mellitus with diabetic chronic kidney disease: Secondary | ICD-10-CM | POA: Diagnosis not present

## 2024-07-16 DIAGNOSIS — N182 Chronic kidney disease, stage 2 (mild): Secondary | ICD-10-CM | POA: Diagnosis not present

## 2024-07-17 ENCOUNTER — Ambulatory Visit (INDEPENDENT_AMBULATORY_CARE_PROVIDER_SITE_OTHER): Admitting: Nurse Practitioner

## 2024-07-17 ENCOUNTER — Encounter: Payer: Self-pay | Admitting: Nurse Practitioner

## 2024-07-17 VITALS — BP 136/74 | HR 64 | Ht 64.0 in | Wt 187.8 lb

## 2024-07-17 DIAGNOSIS — N1831 Chronic kidney disease, stage 3a: Secondary | ICD-10-CM

## 2024-07-17 DIAGNOSIS — Z794 Long term (current) use of insulin: Secondary | ICD-10-CM | POA: Diagnosis not present

## 2024-07-17 DIAGNOSIS — E1122 Type 2 diabetes mellitus with diabetic chronic kidney disease: Secondary | ICD-10-CM

## 2024-07-17 DIAGNOSIS — E782 Mixed hyperlipidemia: Secondary | ICD-10-CM | POA: Diagnosis not present

## 2024-07-17 DIAGNOSIS — E039 Hypothyroidism, unspecified: Secondary | ICD-10-CM

## 2024-07-17 DIAGNOSIS — E1121 Type 2 diabetes mellitus with diabetic nephropathy: Secondary | ICD-10-CM

## 2024-07-17 DIAGNOSIS — N182 Chronic kidney disease, stage 2 (mild): Secondary | ICD-10-CM | POA: Diagnosis not present

## 2024-07-17 DIAGNOSIS — Z7984 Long term (current) use of oral hypoglycemic drugs: Secondary | ICD-10-CM

## 2024-07-17 DIAGNOSIS — I10 Essential (primary) hypertension: Secondary | ICD-10-CM

## 2024-07-17 LAB — POCT GLYCOSYLATED HEMOGLOBIN (HGB A1C): Hemoglobin A1C: 6.8 % — AB (ref 4.0–5.6)

## 2024-07-17 LAB — COMPREHENSIVE METABOLIC PANEL WITH GFR
ALT: 19 IU/L (ref 0–32)
AST: 31 IU/L (ref 0–40)
Albumin: 4.4 g/dL (ref 3.8–4.8)
Alkaline Phosphatase: 76 IU/L (ref 49–135)
BUN/Creatinine Ratio: 21 (ref 12–28)
BUN: 21 mg/dL (ref 8–27)
Bilirubin Total: 0.4 mg/dL (ref 0.0–1.2)
CO2: 22 mmol/L (ref 20–29)
Calcium: 9.9 mg/dL (ref 8.7–10.3)
Chloride: 101 mmol/L (ref 96–106)
Creatinine, Ser: 0.99 mg/dL (ref 0.57–1.00)
Globulin, Total: 2.8 g/dL (ref 1.5–4.5)
Glucose: 169 mg/dL — ABNORMAL HIGH (ref 70–99)
Potassium: 5 mmol/L (ref 3.5–5.2)
Sodium: 140 mmol/L (ref 134–144)
Total Protein: 7.2 g/dL (ref 6.0–8.5)
eGFR: 61 mL/min/1.73 (ref >59)

## 2024-07-17 LAB — TSH: TSH: 1.45 u[IU]/mL (ref 0.450–4.500)

## 2024-07-17 LAB — T4, FREE: Free T4: 1.12 ng/dL (ref 0.82–1.77)

## 2024-07-17 MED ORDER — METFORMIN HCL ER 500 MG PO TB24: 500.0000 mg | ORAL_TABLET | Freq: Two times a day (BID) | ORAL | 3 refills | Status: AC

## 2024-07-17 MED ORDER — GLIPIZIDE ER 5 MG PO TB24: 5.0000 mg | ORAL_TABLET | Freq: Every day | ORAL | 3 refills | Status: AC

## 2024-07-17 MED ORDER — BASAGLAR KWIKPEN 100 UNIT/ML ~~LOC~~ SOPN: 40.0000 [IU] | PEN_INJECTOR | Freq: Every day | SUBCUTANEOUS | 3 refills | Status: AC

## 2024-07-17 MED ORDER — LEVOTHYROXINE SODIUM 125 MCG PO TABS: 125.0000 ug | ORAL_TABLET | Freq: Every day | ORAL | 3 refills | Status: AC

## 2024-07-17 NOTE — Progress Notes (Signed)
 Endocrinology Follow Up Note       07/17/2024, 12:02 PM   Subjective:    Patient ID: Kimberly [name], female    DOB: 11-30-1950.  Kimberly [name] is being seen in follow up after being seen in consultation for management of currently uncontrolled symptomatic diabetes requested by  Cook, [name], DO.   Past Medical History:  Diagnosis Date   Anxiety    Arthritis    Breast cancer (HCC) 07/2021   Depression    Diabetes mellitus    Family history of breast cancer    Family history of colon cancer    Family history of prostate cancer    Family history of skin cancer    GERD (gastroesophageal reflux disease)    High blood pressure    Hypothyroid    Obesity    PONV (postoperative nausea and vomiting)     Past Surgical History:  Procedure Laterality Date   ABDOMINAL HYSTERECTOMY     BREAST SURGERY  12/3//2022   Bunions Right    CARPAL TUNNEL RELEASE Right    JOINT REPLACEMENT     KNEE ARTHROSCOPY Right    MASTECTOMY MODIFIED RADICAL Right 07/29/2021   invasive ductal ca   THYROID  SURGERY     removal   TOTAL KNEE ARTHROPLASTY Right 08/25/2013   Procedure: TOTAL KNEE ARTHROPLASTY;  Surgeon: Taft FORBES Minerva, MD;  Location: [facility];  Service: Orthopedics;  Laterality: Right;   TOTAL KNEE ARTHROPLASTY Left 09/09/2014   Procedure: LEFT TOTAL KNEE ARTHROPLASTY;  Surgeon: Taft FORBES Minerva, MD;  Location: [facility];  Service: Orthopedics;  Laterality: Left;    Social History   Socioeconomic History   Marital status: Widowed    Spouse name: Not on file   Number of children: 5   Years of education: Not on file   Highest education level: Not on file  Occupational History   Occupation: CNA    Employer: [employer]  Tobacco Use   Smoking status: Never   Smokeless tobacco: Never  Vaping Use   Vaping status: Never Used  Substance and Sexual Activity   Alcohol use: No   Drug use: No   Sexual  activity: Yes    Birth control/protection: Surgical  Other Topics Concern   Not on file  Social History Narrative   Pt lives with husband Joe and 1 dog. (Recently widowed)    Social Drivers of Corporate Investment Banker Strain: Low Risk  (02/22/2024)   Overall Financial Resource Strain (CARDIA)    Difficulty of Paying Living Expenses: Not hard at all  Food Insecurity: No Food Insecurity (02/22/2024)   Hunger Vital Sign    Worried About Running Out of Food in the Last Year: Never true    Ran Out of Food in the Last Year: Never true  Transportation Needs: No Transportation Needs (02/22/2024)   PRAPARE - Administrator, Civil Service (Medical): No    Lack of Transportation (Non-Medical): No  Physical Activity: Insufficiently Active (02/22/2024)   Exercise Vital Sign    Days of Exercise per Week: 3 days    Minutes of Exercise per Session: 30 min  Stress: No Stress Concern  Present (02/22/2024)   [employer] of Occupational Health - Occupational Stress Questionnaire    Feeling of Stress: Only a little  Social Connections: Moderately Integrated (02/22/2024)   Social Connection and Isolation Panel    Frequency of Communication with Friends and Family: More than three times a week    Frequency of Social Gatherings with Friends and Family: More than three times a week    Attends Religious Services: More than 4 times per year    Active Member of [employer] or Organizations: Yes    Attends Banker Meetings: More than 4 times per year    Marital Status: Widowed    Family History  Problem Relation Age of Onset   Diabetes Mother    Hypertension Mother    Arthritis Mother    Congestive Heart Failure Father    Hypertension Father    Skin cancer Father 83       squamous cell on lip; metastatic   Cancer Father    Hearing loss Father    Heart disease Father    Hypertension Brother    Skin cancer Brother        squamous cell   Breast cancer Paternal Aunt        dx  29s d. 42s   Prostate cancer Paternal Uncle        dx 80s   Colon cancer Maternal Grandmother        dx 2s d. 45s   Diabetes Other    Cancer Cousin        unk type; possibly pancreatic    Outpatient Encounter Medications as of 07/17/2024  Medication Sig   anastrozole  (ARIMIDEX ) 1 MG tablet Take 1 tablet (1 mg total) by mouth daily.   donepezil  (ARICEPT ) 5 MG tablet Take 1 tablet (5 mg total) by mouth at bedtime.   gabapentin  (NEURONTIN ) 100 MG capsule Take 1 capsule (100 mg total) by mouth 2 (two) times daily. (Patient taking differently: Take 100 mg by mouth daily.)   gabapentin  (NEURONTIN ) 300 MG capsule Take 1 capsule (300 mg total) by mouth at bedtime.   losartan  (COZAAR ) 100 MG tablet Take 1 tablet (100 mg total) by mouth every morning.   metoprolol  succinate (TOPROL -XL) 100 MG 24 hr tablet TAKE 1 TABLET AT BEDTIME   WITH OR IMMEDIATELY        FOLLOWING A MEAL   miconazole  (MICOTIN) 2 % powder Apply topically as needed for itching.   omeprazole  (PRILOSEC) 20 MG capsule Take 1 capsule (20 mg total) by mouth at bedtime.   ULTICARE MINI PEN NEEDLES 31G X 6 MM MISC Use to inject insulin  once daily   venlafaxine  XR (EFFEXOR -XR) 75 MG 24 hr capsule TAKE 1 CAPSULE BY MOUTH ONCE DAILY WITH BREAKFAST   Vitamin D , Ergocalciferol , (DRISDOL ) 1.25 MG (50000 UNIT) CAPS capsule Take 50,000 Units by mouth every 7 (seven) days.   [DISCONTINUED] glipiZIDE  (GLUCOTROL  XL) 5 MG 24 hr tablet Take 1 tablet (5 mg total) by mouth daily with breakfast.   [DISCONTINUED] Insulin  Glargine (BASAGLAR  KWIKPEN) 100 UNIT/ML Inject 34 Units into the skin at bedtime. (Patient taking differently: Inject 34 Units into the skin at bedtime. Patient is injecting 32 units at night)   [DISCONTINUED] levothyroxine  (SYNTHROID ) 125 MCG tablet Take 1 tablet (125 mcg total) by mouth daily before breakfast.   [DISCONTINUED] metFORMIN  (GLUCOPHAGE -XR) 500 MG 24 hr tablet Take 1 tablet (500 mg total) by mouth 2 (two) times daily with a  meal.   calcium  carbonate (CALCIUM 600) 600 MG TABS tablet Take 600 mg by mouth 2 (two) times daily with a meal.   glipiZIDE  (GLUCOTROL  XL) 5 MG 24 hr tablet Take 1 tablet (5 mg total) by mouth daily with breakfast.   hydrochlorothiazide  (HYDRODIURIL ) 25 MG tablet Take 1 tablet (25 mg total) by mouth daily.   Insulin  Glargine (BASAGLAR  KWIKPEN) 100 UNIT/ML Inject 40 Units into the skin at bedtime.   levothyroxine  (SYNTHROID ) 125 MCG tablet Take 1 tablet (125 mcg total) by mouth daily before breakfast.   Magnesium  250 MG TABS Take 250 mg by mouth daily.   metFORMIN  (GLUCOPHAGE -XR) 500 MG 24 hr tablet Take 1 tablet (500 mg total) by mouth 2 (two) times daily with a meal.   No facility-administered encounter medications on file as of 07/17/2024.    ALLERGIES: Allergies  Allergen Reactions   Bactrim  [Sulfamethoxazole -Trimethoprim ] Other (See Comments)    Was told during hospital admission by providers to not ever take this again as it made her worse per patient   Ace Inhibitors Cough    VACCINATION STATUS: Immunization History  Administered Date(s) Administered   Fluad Quad(high Dose 65+) 07/12/2020   Fluad Trivalent(High Dose 65+) 05/02/2023   Influenza,inj,Quad PF,6+ Mos 04/30/2018, 08/24/2022   Influenza-Unspecified 05/21/2016, 05/03/2017, 06/21/2019   Moderna Sars-Covid-2 Vaccination 10/07/2019, 11/05/2019, 08/02/2020   PNEUMOCOCCAL CONJUGATE-20 10/31/2023   Pneumococcal Conjugate-13 03/03/2014, 05/30/2017   Pneumococcal Polysaccharide-23 05/27/2010    Diabetes She presents for her follow-up diabetic visit. She has type 2 diabetes mellitus. Onset time: Diagnosed at approx age of 37. Her disease course has been improving. There are no hypoglycemic associated symptoms. Pertinent negatives for hypoglycemia include no nervousness/anxiousness or tremors. Associated symptoms include foot paresthesias. Pertinent negatives for diabetes include no fatigue, no polydipsia, no polyuria and no  weight loss. There are no hypoglycemic complications. Symptoms are stable. Diabetic complications include nephropathy and peripheral neuropathy. Risk factors for coronary artery disease include diabetes mellitus, dyslipidemia, family history, obesity, post-menopausal and hypertension. Current diabetic treatment includes oral agent (dual therapy) and insulin  injections. She is compliant with treatment most of the time. Her weight is decreasing steadily. She is following a generally healthy diet. Meal planning includes avoidance of concentrated sweets. She has had a previous visit with a dietitian. She participates in exercise three times a week. Her home blood glucose trend is decreasing steadily. Her overall blood glucose range is 140-180 mg/dl. (She presents today with her CGM showing at goal glycemic profile overall.  Her POCT A1c today is 6.8%, increasing from last visit of 6.5%.  Analysis of her CGM shows TIR 78%, TAR 22%, TBR 0% with a GMI of 6.7%.  She notes she has been snacking at night to avoid hypoglycemia.  She did see podiatry yesterday and toenail was removed for fungal infection.) An ACE inhibitor/angiotensin II receptor blocker is being taken. She sees a podiatrist (has seen one in the distant past).Eye exam is current.  Thyroid  Problem Presents for follow-up visit. Patient reports no anxiety, cold intolerance, constipation, depressed mood, fatigue, heat intolerance, leg swelling, tremors, weight gain or weight loss. The symptoms have been stable.    Review of systems  Constitutional: + Minimally fluctuating body weight,  current Body mass index is 32.24 kg/m. , no fatigue, no subjective hyperthermia, no subjective hypothermia Eyes: no blurry vision, no xerophthalmia ENT: no sore throat, no nodules palpated in throat, no dysphagia/odynophagia, no hoarseness Cardiovascular: no chest pain, no shortness of breath, no palpitations, no leg swelling Respiratory: no cough, no  shortness of  breath Gastrointestinal: no nausea/vomiting/diarrhea Musculoskeletal: no muscle/joint aches Skin: no rashes, no hyperemia Neurological: no tremors, + numbness/ tingling/ burning pain to bilateral feet, no dizziness Psychiatric: no depression, no anxiety  Objective:     BP 136/74 (BP Location: Left Arm, Patient Position: Sitting, Cuff Size: Large)   Pulse 64   Ht 5' 4 (1.626 m)   Wt 187 lb 12.8 oz (85.2 kg)   BMI 32.24 kg/m   Wt Readings from Last 3 Encounters:  07/17/24 187 lb 12.8 oz (85.2 kg)  04/14/24 187 lb (84.8 kg)  03/13/24 181 lb 3.2 oz (82.2 kg)     BP Readings from Last 3 Encounters:  07/17/24 136/74  04/14/24 130/85  03/13/24 120/80      Physical Exam- Limited  Constitutional:  Body mass index is 32.24 kg/m. , not in acute distress, normal state of mind Eyes:  EOMI, no exophthalmos Musculoskeletal: no gross deformities, strength intact in all four extremities, no gross restriction of joint movements Skin:  no rashes, no hyperemia Neurological: no tremor with outstretched hands   Diabetic Foot Exam - Simple   Simple Foot Form Visual Inspection See comments: Yes Sensation Testing Intact to touch and monofilament testing bilaterally: Yes See comments: Yes Pulse Check Posterior Tibialis and Dorsalis pulse intact bilaterally: Yes Comments Decreased sensation to monofilament tool bilaterally; left great toenail partially removed by podiatry      CMP ( most recent) CMP     Component Value Date/Time   NA 140 07/16/2024 1021   K 5.0 07/16/2024 1021   CL 101 07/16/2024 1021   CO2 22 07/16/2024 1021   GLUCOSE 169 (H) 07/16/2024 1021   GLUCOSE 121 (H) 12/31/2023 1012   BUN 21 07/16/2024 1021   CREATININE 0.99 07/16/2024 1021   CREATININE 1.06 08/11/2014 0701   CALCIUM 9.9 07/16/2024 1021   CALCIUM 10.9 (H) 12/28/2022 1452   PROT 7.2 07/16/2024 1021   ALBUMIN 4.4 07/16/2024 1021   AST 31 07/16/2024 1021   ALT 19 07/16/2024 1021   ALKPHOS 76  07/16/2024 1021   BILITOT 0.4 07/16/2024 1021   GFRNONAA >60 12/31/2023 1012   GFRAA 65 10/11/2020 1146     Diabetic Labs (most recent): Lab Results  Component Value Date   HGBA1C 6.8 (A) 07/17/2024   HGBA1C 6.5 (A) 03/13/2024   HGBA1C 7.1 (A) 10/08/2023   MICROALBUR 10 mg/L 08/09/2022   MICROALBUR 1.1 06/03/2014   MICROALBUR 3.48 (H) 07/09/2013     Lipid Panel ( most recent) Lipid Panel     Component Value Date/Time   CHOL 163 10/02/2023 0857   TRIG 213 (H) 10/02/2023 0857   HDL 40 10/02/2023 0857   CHOLHDL 4.1 10/02/2023 0857   CHOLHDL 3.7 06/03/2014 0709   VLDL 40 06/03/2014 0709   LDLCALC 87 10/02/2023 0857   LABVLDL 36 10/02/2023 0857      Lab Results  Component Value Date   TSH 1.450 07/16/2024   TSH 2.220 10/02/2023   TSH 0.249 (L) 03/19/2023   TSH 0.490 01/16/2023   TSH 0.414 (L) 07/31/2022   TSH 0.240 (L) 01/27/2022   TSH 0.133 (L) 10/03/2021   TSH 0.653 04/11/2021   TSH 1.490 04/19/2020   TSH 2.470 07/31/2019   FREET4 1.12 07/16/2024   FREET4 1.17 10/02/2023   FREET4 1.13 (H) 03/26/2023   FREET4 1.33 07/31/2022   FREET4 1.31 01/27/2022   FREET4 1.65 10/03/2021           Assessment & Plan:  1) Type 2 diabetes mellitus with diabetic nephropathy, without long-term current use of insulin  (HCC)  She presents today with her CGM showing at goal glycemic profile overall.  Her POCT A1c today is 6.8%, increasing from last visit of 6.5%.  Analysis of her CGM shows TIR 78%, TAR 22%, TBR 0% with a GMI of 6.7%.  She notes she has been snacking at night to avoid hypoglycemia.  She did see podiatry yesterday and toenail was removed for fungal infection.  - Kimberly [name] has currently uncontrolled symptomatic type 2 DM since 73 years of age.  -Recent labs reviewed.  - I had a long discussion with her about the progressive nature of diabetes and the pathology behind its complications. -her diabetes is complicated by mild CKD, peripheral neuropathy and  she remains at a high risk for more acute and chronic complications which include CAD, CVA, CKD, retinopathy, and neuropathy. These are all discussed in detail with her.  - Nutritional counseling repeated/built upon at each appointment.  - The patient admits there is a room for improvement in their diet and drink choices. -  Suggestion is made for the patient to avoid simple carbohydrates from their diet including Cakes, Sweet Desserts / Pastries, Ice Cream, Soda (diet and regular), Sweet Tea, Candies, Chips, Cookies, Sweet Pastries, Store Bought Juices, Alcohol in Excess of 1-2 drinks a day, Artificial Sweeteners, Coffee Creamer, and Sugar-free Products. This will help patient to have stable blood glucose profile and potentially avoid unintended weight gain.   - I encouraged the patient to switch to unprocessed or minimally processed complex starch and increased protein intake (animal or plant source), fruits, and vegetables.   - Patient is advised to stick to a routine mealtimes to eat 3 meals a day and avoid unnecessary snacks (to snack only to correct hypoglycemia).  - she declines referral to Penny Crumpton, RDN, CDE for diabetes education at this time.  - I have approached her with the following individualized plan to manage her diabetes and patient agrees:   -She is advised to lower her Toujeo  to 20 units SQ nightly, continue Metformin  500 mg ER po twice daily with meals and Glipizide  5 mg XL daily with breakfast for now.    -she is encouraged to continue monitoring blood glucose twice daily (using her CGM), before breakfast and before bed, and to call the clinic if she has readings less than 70 or above 200 for 3 tests in a row.   She is benefiting from CGM device.  - she will be considered for incretin therapy as appropriate next visit.  - Specific targets for  A1c; LDL, HDL, and Triglycerides were discussed with the patient.  2) Blood Pressure /Hypertension:  her blood pressure is  controlled to target.   she is advised to continue her current medications including HCTZ 25 mg po daily, Losartan  100 mg p.o. daily with breakfast, and Metoprolol  100 mg po daily.  3) Lipids/Hyperlipidemia:    Review of her recent lipid panel from 10/02/23 showed controlled LDL at 87 and elevated triglycerides of 213 (improved).  She prefers to stay away from statins at this time.  She is advised to avoid fried foods, red meats, and butter.    4)  Weight/Diet:  her Body mass index is 32.24 kg/m.  -  clearly complicating her diabetes care.   she is a candidate for weight loss. I discussed with her the fact that loss of 5 - 10% of her  current body weight  will have the most impact on her diabetes management.  Exercise, and detailed carbohydrates information provided  -  detailed on discharge instructions.  5) Vitamin D  Deficiency Her recent vitamin D  level was 42.29 on 07/06/23- checked by her oncologist. She is also undergoing evaluation for hypercalcemia with him as well.  6) Hypothyroidism-unspecified Her previsit TFTs are consistent with appropriate hormone replacement.  She is advised to continue Levothyroxine  125 mcg po daily before breakfast.    - The correct intake of thyroid  hormone (Levothyroxine , Synthroid ), is on empty stomach first thing in the morning, with water, separated by at least 30 minutes from breakfast and other medications,  and separated by more than 4 hours from calcium, iron, multivitamins, acid reflux medications (PPIs).  - This medication is a life-long medication and will be needed to correct thyroid  hormone imbalances for the rest of your life.  The dose may change from time to time, based on thyroid  blood work.  - It is extremely important to be consistent taking this medication, near the same time each morning.  -AVOID TAKING PRODUCTS CONTAINING BIOTIN (commonly found in Hair, Skin, Nails vitamins) AS IT INTERFERES WITH THE VALIDITY OF THYROID  FUNCTION BLOOD  TESTS.  6) Chronic Care/Health Maintenance: -she is on ACEI/ARB medications and is encouraged to initiate and continue to follow up with Ophthalmology, Dentist, Podiatrist at least yearly or according to recommendations, and advised to stay away from smoking. I have recommended yearly flu vaccine and pneumonia vaccine at least every 5 years; moderate intensity exercise for up to 150 minutes weekly; and sleep for at least 7 hours a day.  - she is advised to maintain close follow up with Cook, [name], DO for primary care needs, as well as her other providers for optimal and coordinated care.     I spent  46  minutes in the care of the patient today including review of labs from CMP, Lipids, Thyroid  Function, Hematology (current and previous including abstractions from other facilities); face-to-face time discussing  her blood glucose readings/logs, discussing hypoglycemia and hyperglycemia episodes and symptoms, medications doses, her options of short and long term treatment based on the latest standards of care / guidelines;  discussion about incorporating lifestyle medicine;  and documenting the encounter. Risk reduction counseling performed per USPSTF guidelines to reduce obesity and cardiovascular risk factors.     Please refer to Patient Instructions for Blood Glucose Monitoring and Insulin /Medications Dosing Guide  in media tab for additional information. Please  also refer to  Patient Self Inventory in the Media  tab for reviewed elements of pertinent patient history.  Kimberly [name] participated in the discussions, expressed understanding, and voiced agreement with the above plans.  All questions were answered to her satisfaction. she is encouraged to contact clinic should she have any questions or concerns prior to her return visit.     Follow up plan: - Return in about 4 months (around 11/14/2024) for Diabetes F/U with A1c in office, No previsit labs, Bring meter and  logs.   Benton Rio, [facility] [address] [place], [place] [address] Phone: [phone] Fax: [phone]  07/17/2024, 12:02 PM

## 2024-07-29 DIAGNOSIS — Z4889 Encounter for other specified surgical aftercare: Secondary | ICD-10-CM | POA: Diagnosis not present

## 2024-07-31 ENCOUNTER — Inpatient Hospital Stay: Attending: Oncology

## 2024-07-31 DIAGNOSIS — M2550 Pain in unspecified joint: Secondary | ICD-10-CM | POA: Diagnosis not present

## 2024-07-31 DIAGNOSIS — Z9011 Acquired absence of right breast and nipple: Secondary | ICD-10-CM | POA: Diagnosis not present

## 2024-07-31 DIAGNOSIS — Z87440 Personal history of urinary (tract) infections: Secondary | ICD-10-CM | POA: Insufficient documentation

## 2024-07-31 DIAGNOSIS — C50411 Malignant neoplasm of upper-outer quadrant of right female breast: Secondary | ICD-10-CM | POA: Insufficient documentation

## 2024-07-31 DIAGNOSIS — R200 Anesthesia of skin: Secondary | ICD-10-CM | POA: Diagnosis not present

## 2024-07-31 DIAGNOSIS — G4700 Insomnia, unspecified: Secondary | ICD-10-CM | POA: Diagnosis not present

## 2024-07-31 DIAGNOSIS — M8580 Other specified disorders of bone density and structure, unspecified site: Secondary | ICD-10-CM | POA: Insufficient documentation

## 2024-07-31 DIAGNOSIS — Z800 Family history of malignant neoplasm of digestive organs: Secondary | ICD-10-CM | POA: Insufficient documentation

## 2024-07-31 DIAGNOSIS — Z79811 Long term (current) use of aromatase inhibitors: Secondary | ICD-10-CM | POA: Insufficient documentation

## 2024-07-31 DIAGNOSIS — R232 Flushing: Secondary | ICD-10-CM | POA: Insufficient documentation

## 2024-07-31 DIAGNOSIS — G309 Alzheimer's disease, unspecified: Secondary | ICD-10-CM | POA: Diagnosis not present

## 2024-07-31 DIAGNOSIS — Z803 Family history of malignant neoplasm of breast: Secondary | ICD-10-CM | POA: Insufficient documentation

## 2024-07-31 DIAGNOSIS — E559 Vitamin D deficiency, unspecified: Secondary | ICD-10-CM | POA: Insufficient documentation

## 2024-07-31 DIAGNOSIS — Z170 Estrogen receptor positive status [ER+]: Secondary | ICD-10-CM | POA: Insufficient documentation

## 2024-07-31 DIAGNOSIS — D472 Monoclonal gammopathy: Secondary | ICD-10-CM | POA: Diagnosis not present

## 2024-07-31 DIAGNOSIS — Z79899 Other long term (current) drug therapy: Secondary | ICD-10-CM | POA: Insufficient documentation

## 2024-07-31 DIAGNOSIS — R5383 Other fatigue: Secondary | ICD-10-CM | POA: Insufficient documentation

## 2024-07-31 LAB — CBC WITH DIFFERENTIAL/PLATELET
Abs Immature Granulocytes: 0.02 K/uL (ref 0.00–0.07)
Basophils Absolute: 0.1 K/uL (ref 0.0–0.1)
Basophils Relative: 1 %
Eosinophils Absolute: 0.3 K/uL (ref 0.0–0.5)
Eosinophils Relative: 4 %
HCT: 34.5 % — ABNORMAL LOW (ref 36.0–46.0)
Hemoglobin: 11.9 g/dL — ABNORMAL LOW (ref 12.0–15.0)
Immature Granulocytes: 0 %
Lymphocytes Relative: 37 %
Lymphs Abs: 2.8 K/uL (ref 0.7–4.0)
MCH: 33.7 pg (ref 26.0–34.0)
MCHC: 34.5 g/dL (ref 30.0–36.0)
MCV: 97.7 fL (ref 80.0–100.0)
Monocytes Absolute: 0.7 K/uL (ref 0.1–1.0)
Monocytes Relative: 9 %
Neutro Abs: 3.8 K/uL (ref 1.7–7.7)
Neutrophils Relative %: 49 %
Platelets: 317 K/uL (ref 150–400)
RBC: 3.53 MIL/uL — ABNORMAL LOW (ref 3.87–5.11)
RDW: 12.3 % (ref 11.5–15.5)
WBC: 7.7 K/uL (ref 4.0–10.5)
nRBC: 0 % (ref 0.0–0.2)

## 2024-07-31 LAB — COMPREHENSIVE METABOLIC PANEL WITH GFR
ALT: 22 U/L (ref 0–44)
AST: 28 U/L (ref 15–41)
Albumin: 4.4 g/dL (ref 3.5–5.0)
Alkaline Phosphatase: 78 U/L (ref 38–126)
Anion gap: 15 (ref 5–15)
BUN: 23 mg/dL (ref 8–23)
CO2: 23 mmol/L (ref 22–32)
Calcium: 9.7 mg/dL (ref 8.9–10.3)
Chloride: 101 mmol/L (ref 98–111)
Creatinine, Ser: 0.95 mg/dL (ref 0.44–1.00)
GFR, Estimated: >60 mL/min (ref >60)
Glucose, Bld: 177 mg/dL — ABNORMAL HIGH (ref 70–99)
Potassium: 3.8 mmol/L (ref 3.5–5.1)
Sodium: 138 mmol/L (ref 135–145)
Total Bilirubin: 0.5 mg/dL (ref 0.0–1.2)
Total Protein: 7.6 g/dL (ref 6.5–8.1)

## 2024-07-31 LAB — VITAMIN D 25 HYDROXY (VIT D DEFICIENCY, FRACTURES): Vit D, 25-Hydroxy: 69.66 ng/mL (ref 30–100)

## 2024-08-01 LAB — KAPPA/LAMBDA LIGHT CHAINS
Kappa free light chain: 25.6 mg/L — ABNORMAL HIGH (ref 3.3–19.4)
Kappa, lambda light chain ratio: 1.33 (ref 0.26–1.65)
Lambda free light chains: 19.2 mg/L (ref 5.7–26.3)

## 2024-08-03 LAB — PROTEIN ELECTROPHORESIS, SERUM
A/G Ratio: 1.1 (ref 0.7–1.7)
Albumin ELP: 3.8 g/dL (ref 2.9–4.4)
Alpha-1-Globulin: 0.2 g/dL (ref 0.0–0.4)
Alpha-2-Globulin: 0.9 g/dL (ref 0.4–1.0)
Beta Globulin: 1 g/dL (ref 0.7–1.3)
Gamma Globulin: 1.3 g/dL (ref 0.4–1.8)
Globulin, Total: 3.4 g/dL (ref 2.2–3.9)
Total Protein ELP: 7.2 g/dL (ref 6.0–8.5)

## 2024-08-03 LAB — IMMUNOFIXATION ELECTROPHORESIS
IgA: 152 mg/dL (ref 64–422)
IgG (Immunoglobin G), Serum: 1279 mg/dL (ref 586–1602)
IgM (Immunoglobulin M), Srm: 150 mg/dL (ref 26–217)
Total Protein ELP: 7.1 g/dL (ref 6.0–8.5)

## 2024-08-07 ENCOUNTER — Inpatient Hospital Stay (HOSPITAL_BASED_OUTPATIENT_CLINIC_OR_DEPARTMENT_OTHER): Admitting: Oncology

## 2024-08-07 DIAGNOSIS — C50411 Malignant neoplasm of upper-outer quadrant of right female breast: Secondary | ICD-10-CM | POA: Diagnosis not present

## 2024-08-07 DIAGNOSIS — Z1231 Encounter for screening mammogram for malignant neoplasm of breast: Secondary | ICD-10-CM

## 2024-08-07 DIAGNOSIS — E559 Vitamin D deficiency, unspecified: Secondary | ICD-10-CM

## 2024-08-07 DIAGNOSIS — D472 Monoclonal gammopathy: Secondary | ICD-10-CM

## 2024-08-07 NOTE — Assessment & Plan Note (Addendum)
°-   She is tolerating anastrozole  very well.  Minor hot flashes and arthralgias are stable. Continue anastrozole  therapy.  - Screening mammogram of the left breast on 12/31/2023 was BI-RADS Category 1 negative. Next mammogram due in one year around May 2026.  -Recommend labs in 6 months.

## 2024-08-07 NOTE — Assessment & Plan Note (Signed)
°-   She is tolerating anastrozole  very well.  Minor hot flashes and arthralgias are stable. Continue anastrozole  therapy.  - Screening mammogram of the left breast on 12/31/2023 was BI-RADS Category 1 negative. Next mammogram due in one year around May 2026.  -Recommend labs in 6 months.

## 2024-08-07 NOTE — Progress Notes (Unsigned)
 [name] OFFICE PROGRESS NOTE  [name], Kimberly G, DO  ASSESSMENT & PLAN:    Assessment & Plan Malignant neoplasm of upper-outer quadrant of right female breast, unspecified estrogen receptor status (HCC) - She is tolerating anastrozole  very well.  Minor hot flashes and arthralgias are stable. Continue anastrozole  therapy.  -She will complete 5 years of anastrozole  in February 2028. -She denies any new lumps or bumps. - Screening mammogram of the left breast on 12/31/2023 was BI-RADS Category 1 negative. Next mammogram due in one year around May 2026.  -Recommend labs in 6 months. Vitamin D  deficiency, unspecified Patient is currently taking vitamin D  and calcium supplements.  Vitamin D  level 69.66. Had bone scan on 01/23/2024 which showed osteopenia and a 9.2% change from previous exam.  She has a 9.3% risk of osteoporotic fracture and 1.2% risk of hip fracture within the next 10 years. We discussed weightbearing exercises and taking supplements.  Repeat bone density in 2 years. MGUS (monoclonal gammopathy of unknown significance) Discovered incidentally during workup for memory loss. Neurology on 12/05/2023 do a protein electrophoresis which showed M protein of 0.1 IgG monoclonal protein with kappa light chain specificity. She had a UPEP, metastatic bone survey shortly after which were both unremarkable. Bone survey did not reveal any lytic lesions. Most recent labs from 07/31/2024 do not reveal crab criteria.  Protein electrophoresis does not show M spike, elevated kappa free light chain but normal kappa lambda light chain ratio.  Immunofixation unremarkable.  Calcium 9.7, creatinine 0.95, hemoglobin 11.9 and she denies any bone pain.  Continue to monitor every 6 months.  Orders Placed This Encounter  Procedures   MM 3D SCREENING MAMMOGRAM UNILATERAL LEFT BREAST    Standing Status:   Future    Expected Date:   02/04/2025    Expiration Date:   08/07/2025    Reason for Exam  (SYMPTOM  OR DIAGNOSIS REQUIRED):   screening    Preferred imaging location?:   [facility]   CBC with Differential    Standing Status:   Future    Expected Date:   02/20/2025    Expiration Date:   08/08/2025   Comprehensive metabolic panel    Standing Status:   Future    Expected Date:   02/20/2025    Expiration Date:   08/08/2025   Vitamin D  25 hydroxy    Standing Status:   Future    Expected Date:   02/20/2025    Expiration Date:   08/08/2025   Immunofixation electrophoresis    Standing Status:   Future    Expected Date:   02/20/2025    Expiration Date:   08/08/2025   Protein electrophoresis, serum    Standing Status:   Future    Expected Date:   02/20/2025    Expiration Date:   08/08/2025   Kappa/lambda light chains    Standing Status:   Future    Expected Date:   02/20/2025    Expiration Date:   08/08/2025   CBC with Differential    Standing Status:   Future    Expected Date:   02/20/2025    Expiration Date:   08/08/2025   Comprehensive metabolic panel    Standing Status:   Future    Expected Date:   02/20/2025    Expiration Date:   08/08/2025   Vitamin D  25 hydroxy    Standing Status:   Future    Expected Date:   02/20/2025  Expiration Date:   08/08/2025   Immunofixation electrophoresis    Standing Status:   Future    Expected Date:   02/20/2025    Expiration Date:   08/08/2025   Protein electrophoresis, serum    Standing Status:   Future    Expected Date:   02/20/2025    Expiration Date:   08/08/2025   Kappa/lambda light chains    Standing Status:   Future    Expected Date:   02/20/2025    Expiration Date:   08/08/2025    INTERVAL HISTORY:  Kimberly [name] is a 73 y.o. female presenting to clinic today for follow up of right breast cancer, MGUS and vitamin D  deficiency.    Patient was seen by neurology in April for some memory concerns.  Lab work showed evidence of Alzheimer's disease biomarkers and she was started on Aricept .  SPEP IFE showed a M spike.  Looks  like the plan is to repeat this in approximately 6 months.   Her husband passed away about 6 months ago and she is slowly starting to get used to her new normal. Reports her daughter has come to stay with her intermittently which has been nice.   She continues to tolerate Arimidex .  Hair loss has greatly improved since she started hair vitamins.   Reports today, appetite and energy levels are 100%.  She has numbness and burning in her feet and disrupted sleep at times.  Reports overall she is doing better.  She has decided to stay in her current town as previously she had contemplated moving to a retirement community.   She denies any new lumps or bumps that she is concerned with.   We reviewed CBC, CMP, MGUS labs and vitamin D .  SUMMARY OF HEMATOLOGIC HISTORY: Oncology History  Breast cancer of upper-outer quadrant of right female breast (HCC)  08/31/2021 Initial Diagnosis   Breast cancer of upper-outer quadrant of right female breast [facility])    Genetic Testing   Negative genetic testing. No pathogenic variants identified on the [facility] Multi-Cancer+RNA panel. VUS in [facility] called c.1022G>A identified. The report date is 10/24/2021.  The Multi-Cancer Panel + RNA offered by [facility] includes sequencing and/or deletion duplication testing of the following 84 genes: AIP, ALK, APC, ATM, AXIN2,BAP1,  BARD1, BLM, BMPR1A, BRCA1, BRCA2, BRIP1, CASR, CDC73, CDH1, CDK4, CDKN1B, CDKN1C, CDKN2A (p14ARF), CDKN2A (p16INK4a), CEBPA, CHEK2, CTNNA1, DICER1, DIS3L2, EGFR (c.2369C>T, p.Thr790Met variant only), EPCAM (Deletion/duplication testing only), FH, FLCN, GATA2, GPC3, GREM1 (Promoter region deletion/duplication testing only), HOXB13 (c.251G>A, p.Gly84Glu), HRAS, KIT, MAX, MEN1, MET, MITF (c.952G>A, p.Glu318Lys variant only), MLH1, MSH2, MSH3, MSH6, MUTYH, NBN, NF1, NF2, NTHL1, PALB2, PDGFRA, PHOX2B, PMS2, POLD1, POLE, POT1, PRKAR1A, PTCH1, PTEN, RAD50, RAD51C, RAD51D, RB1, RECQL4, RET, RUNX1, SDHAF2, SDHA  (sequence changes only), SDHB, SDHC, SDHD, SMAD4, SMARCA4, SMARCB1, SMARCE1, STK11, SUFU, TERC, TERT, TMEM127, TP53, TSC1, TSC2, VHL, WRN and WT1.    1. Stage IIa (T1CN1) right breast upper outer quadrant IDC: - Abnormal mammogram on 06/02/2021. - Right breast 9:00 mass biopsy on 06/30/2021, invasive ductal carcinoma, ER 70% positive, PR negative, Ki-67 2%, HER2 1+.  Right axillary lymph node biopsy was positive for metastatic carcinoma. - She met with Dr. Lowery for possible reconstruction but decided against it. - She underwent right breast mastectomy with lymph node biopsy on 07/29/2021. - Pathology showed 1.1 cm grade 2 IDC, associated DCIS, margins negative.  Metastatic carcinoma involving 1/9 lymph nodes.  PT1CPN1A. - Oncotype DX recurrence score 22.  Anastrozole  started in February 2023.  2. Social/family history: - She lives at home with her husband.  Today she is seen with her daughter. 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  Non-smoker. - Father had squamous cell carcinoma of the lip which has metastasized.  Maternal grandmother had colon cancer.  Paternal uncle had prostate cancer and paternal aunt had breast cancer.   CBC    Component Value Date/Time   WBC 7.7 07/31/2024 1009   RBC 3.53 (L) 07/31/2024 1009   HGB 11.9 (L) 07/31/2024 1009   HGB 12.3 11/27/2023 1019   HCT 34.5 (L) 07/31/2024 1009   HCT 36.1 11/27/2023 1019   PLT 317 07/31/2024 1009   PLT 284 11/27/2023 1019   MCV 97.7 07/31/2024 1009   MCV 96 11/27/2023 1019   MCH 33.7 07/31/2024 1009   MCHC 34.5 07/31/2024 1009   RDW 12.3 07/31/2024 1009   RDW 12.5 11/27/2023 1019   LYMPHSABS 2.8 07/31/2024 1009   LYMPHSABS 2.2 11/27/2023 1019   MONOABS 0.7 07/31/2024 1009   EOSABS 0.3 07/31/2024 1009   EOSABS 0.2 11/27/2023 1019   BASOSABS 0.1 07/31/2024 1009   BASOSABS 0.1 11/27/2023 1019       Latest Ref Rng & Units 07/31/2024   10:09 AM 07/16/2024   10:21 AM 12/31/2023   10:12 AM   CMP  Glucose 70 - 99 mg/dL 822  830  878   BUN 8 - 23 mg/dL 23  21  23    Creatinine 0.44 - 1.00 mg/dL 9.04  9.00  9.04   Sodium 135 - 145 mmol/L 138  140  137   Potassium 3.5 - 5.1 mmol/L 3.8  5.0  4.1   Chloride 98 - 111 mmol/L 101  101  100   CO2 22 - 32 mmol/L 23  22  27    Calcium 8.9 - 10.3 mg/dL 9.7  9.9  9.8   Total Protein 6.5 - 8.1 g/dL 7.6  7.2  7.8   Total Bilirubin 0.0 - 1.2 mg/dL 0.5  0.4  0.8   Alkaline Phos 38 - 126 U/L 78  76  69   AST 15 - 41 U/L 28  31  39   ALT 0 - 44 U/L 22  19  40      Lab Results  Component Value Date   FERRITIN 51 11/27/2023   VITAMINB12 352 05/14/2023    Vitals:   08/07/24 1012  BP: (!) 145/61  Pulse: (!) 56  Resp: 16  Temp: 97.6 F (36.4 C)  SpO2: 96%    Review of System:  Review of Systems  Constitutional:  Positive for malaise/fatigue.  Neurological:  Positive for dizziness, tingling and sensory change.  Psychiatric/Behavioral:  The patient has insomnia.     Physical Exam: Physical Exam Constitutional:      Appearance: Normal appearance.  HENT:     Head: Normocephalic and atraumatic.  Eyes:     Pupils: Pupils are equal, round, and reactive to light.  Cardiovascular:     Rate and Rhythm: Normal rate and regular rhythm.     Heart sounds: Normal heart sounds. No murmur heard. Pulmonary:     Effort: Pulmonary effort is normal.     Breath sounds: Normal breath sounds. No wheezing.  Abdominal:     General: Bowel sounds are normal. There is no distension.     Palpations: Abdomen is soft.     Tenderness: There is no abdominal tenderness.  Musculoskeletal:  General: Normal range of motion.     Cervical back: Normal range of motion.  Skin:    General: Skin is warm and dry.     Findings: No rash.  Neurological:     Mental Status: She is alert and oriented to person, place, and time.     Gait: Gait is intact.  Psychiatric:        Mood and Affect: Mood and affect normal.        Cognition and Memory: Memory  normal.        Judgment: Judgment normal.      I spent 20 minutes dedicated to the care of this patient (face-to-face and non-face-to-face) on the date of the encounter to include what is described in the assessment and plan.,  Delon Hope, NP 08/08/2024 7:50 AM

## 2024-08-25 ENCOUNTER — Other Ambulatory Visit: Payer: Self-pay | Admitting: Neurology

## 2024-08-25 ENCOUNTER — Encounter: Payer: Self-pay | Admitting: *Deleted

## 2024-08-25 ENCOUNTER — Other Ambulatory Visit: Payer: Self-pay | Admitting: Family Medicine

## 2024-09-12 ENCOUNTER — Ambulatory Visit: Admitting: Family Medicine

## 2024-11-14 ENCOUNTER — Ambulatory Visit: Admitting: Nurse Practitioner

## 2024-11-27 ENCOUNTER — Ambulatory Visit: Payer: Medicare Other | Admitting: Orthopedic Surgery

## 2024-12-31 ENCOUNTER — Ambulatory Visit (HOSPITAL_COMMUNITY)

## 2025-01-13 ENCOUNTER — Ambulatory Visit: Admitting: Neurology

## 2025-01-29 ENCOUNTER — Inpatient Hospital Stay

## 2025-02-05 ENCOUNTER — Inpatient Hospital Stay: Admitting: Oncology

## 2025-02-12 ENCOUNTER — Inpatient Hospital Stay: Admitting: Oncology
